# Patient Record
Sex: Male | Born: 1945 | Race: White | Hispanic: No | Marital: Married | Smoking: Former smoker
Health system: Southern US, Community
[De-identification: ages and names within clinical notes are randomized; demographics above are authoritative.]

## PROBLEM LIST (undated history)

## (undated) DIAGNOSIS — I639 Cerebral infarction, unspecified: Secondary | ICD-10-CM

## (undated) DIAGNOSIS — I2510 Atherosclerotic heart disease of native coronary artery without angina pectoris: Secondary | ICD-10-CM

## (undated) DIAGNOSIS — Z720 Tobacco use: Secondary | ICD-10-CM

## (undated) DIAGNOSIS — E785 Hyperlipidemia, unspecified: Secondary | ICD-10-CM

## (undated) DIAGNOSIS — I10 Essential (primary) hypertension: Secondary | ICD-10-CM

## (undated) DIAGNOSIS — R0609 Other forms of dyspnea: Secondary | ICD-10-CM

## (undated) DIAGNOSIS — I619 Nontraumatic intracerebral hemorrhage, unspecified: Secondary | ICD-10-CM

## (undated) DIAGNOSIS — R0600 Dyspnea, unspecified: Secondary | ICD-10-CM

## (undated) DIAGNOSIS — R569 Unspecified convulsions: Secondary | ICD-10-CM

## (undated) HISTORY — PX: CORONARY ARTERY BYPASS GRAFT: SHX141

## (undated) HISTORY — DX: Hyperlipidemia, unspecified: E78.5

## (undated) HISTORY — DX: Atherosclerotic heart disease of native coronary artery without angina pectoris: I25.10

## (undated) HISTORY — DX: Essential (primary) hypertension: I10

## (undated) HISTORY — DX: Nontraumatic intracerebral hemorrhage, unspecified: I61.9

## (undated) HISTORY — DX: Other forms of dyspnea: R06.09

## (undated) HISTORY — DX: Dyspnea, unspecified: R06.00

## (undated) HISTORY — DX: Tobacco use: Z72.0

---

## 2005-07-14 ENCOUNTER — Ambulatory Visit: Payer: Self-pay | Admitting: *Deleted

## 2005-07-22 ENCOUNTER — Ambulatory Visit: Payer: Self-pay | Admitting: *Deleted

## 2006-09-21 ENCOUNTER — Ambulatory Visit: Payer: Self-pay | Admitting: *Deleted

## 2006-09-21 LAB — CONVERTED CEMR LAB
Albumin: 3.6 g/dL (ref 3.5–5.2)
CO2: 25 meq/L (ref 19–32)
Chol/HDL Ratio, serum: 4.5
Creatinine, Ser: 1 mg/dL (ref 0.4–1.5)
Glucose, Bld: 113 mg/dL — ABNORMAL HIGH (ref 70–99)
Sodium: 139 meq/L (ref 135–145)
Total Bilirubin: 0.7 mg/dL (ref 0.3–1.2)
Triglyceride fasting, serum: 65 mg/dL (ref 0–149)
VLDL: 13 mg/dL (ref 0–40)

## 2006-09-29 ENCOUNTER — Ambulatory Visit: Payer: Self-pay | Admitting: *Deleted

## 2007-09-09 ENCOUNTER — Ambulatory Visit: Payer: Self-pay | Admitting: Cardiovascular Disease

## 2007-12-12 ENCOUNTER — Ambulatory Visit: Payer: Self-pay

## 2007-12-12 LAB — CONVERTED CEMR LAB
AST: 19 units/L (ref 0–37)
Bilirubin, Direct: 0.2 mg/dL (ref 0.0–0.3)
HDL: 27.2 mg/dL — ABNORMAL LOW (ref >39.0)
LDL Cholesterol: 64 mg/dL (ref 0–99)
Triglycerides: 77 mg/dL (ref 0–149)
VLDL: 15 mg/dL (ref 0–40)

## 2007-12-28 ENCOUNTER — Ambulatory Visit: Payer: Self-pay

## 2008-12-24 ENCOUNTER — Ambulatory Visit: Payer: Self-pay | Admitting: Cardiovascular Disease

## 2008-12-24 LAB — CONVERTED CEMR LAB
AST: 18 units/L (ref 0–37)
Alkaline Phosphatase: 57 units/L (ref 39–117)
PSA: 0.99 ng/mL (ref 0.10–4.00)
Total Bilirubin: 0.7 mg/dL (ref 0.3–1.2)

## 2008-12-27 ENCOUNTER — Ambulatory Visit: Admission: RE | Admit: 2008-12-27 | Discharge: 2008-12-27 | Payer: Self-pay | Admitting: Cardiovascular Disease

## 2009-11-28 ENCOUNTER — Encounter (INDEPENDENT_AMBULATORY_CARE_PROVIDER_SITE_OTHER): Payer: Self-pay | Admitting: *Deleted

## 2010-01-16 ENCOUNTER — Encounter: Payer: Self-pay | Admitting: Cardiovascular Disease

## 2010-02-14 ENCOUNTER — Encounter: Payer: Self-pay | Admitting: Cardiovascular Disease

## 2010-03-07 ENCOUNTER — Encounter: Payer: Self-pay | Admitting: Cardiovascular Disease

## 2010-12-25 NOTE — Letter (Signed)
Summary: Appointment - Reminder 2  [employer], Main Office  [address]   [place], [place] [address]   Phone: [phone]  Fax: [phone]     November 28, 2009 MRN: [id number]   [facility] [address] [place], [place]  [address]   Dear Mr. HINTON,  Our records indicate that it is time to schedule a follow-up appointment with Dr. Eden Emms. It is very important that we reach you to schedule this appointment. We look forward to participating in your health care needs. Please contact us at the number listed above at your earliest convenience to schedule your appointment.  If you are unable to make an appointment at this time, give [place] a call so we can update our records.  Sincerely,   Migdalia Dk [facility] Scheduling Team

## 2010-12-25 NOTE — Miscellaneous (Signed)
  Clinical Lists Changes  Medications: Added new medication of RAMIPRIL 5 MG CAPS (RAMIPRIL) Take one capsule by mouth daily

## 2010-12-25 NOTE — Miscellaneous (Signed)
  Clinical Lists Changes  Medications: Added new medication of LIPITOR 40 MG TABS (ATORVASTATIN CALCIUM) Take one tablet by mouth daily.

## 2010-12-25 NOTE — Miscellaneous (Signed)
  Clinical Lists Changes  Medications: Added new medication of NIASPAN 500 MG CR-TABS (NIACIN (ANTIHYPERLIPIDEMIC)) 1 tab  by mouth once daily

## 2011-04-07 NOTE — Assessment & Plan Note (Signed)
[facility]                            CARDIOLOGY OFFICE NOTE   NAME:Jaime Pierce, Jaime Pierce                    MRN:          [id number]  DATE:12/24/2008                            DOB:          12-15-1945    This is a 65 year old patient with previous history of coronary bypass  surgery, coronary risk factors including smoking, hyperlipidemia, and  hypertension.   His last Myoview was done on December 12, 2007 and was nonischemic.  His  bypass was done in 2004.  He is not having significant chest pain, PND,  or orthopnea.  He has exertional dyspnea.  I suspect this is from his  smoking.  He continues to smoke about 12 cigarettes a day and I  counseled him for less than 10 minutes regarding long-term risk of this.  In fact, last time I saw him I suggested that he get baseline PFTs.  Unfortunately, Kooper does not see a primary care doctor.  I am about  the only doctor he sees.  He did get his flu shots and Pneumovax.  He  lives up by the [facility] area and I recommended that he see Dr.  Hetty Ely or Everrett Coombe, FNP.   He has been compliant with his medications.  His last LDL a year ago was  64.  His LFTs were normal.  At that time, they have not been rechecked.   REVIEW OF SYSTEMS:  Otherwise remarkable for no significant palpitations  or syncope.  He has mild lower extremity edema in the right leg greater  than left which does not bother him.  He does not have a cough,  hemoptysis, pleuritic pain, or sputum production.   He is allergic to SULFA.   MEDICATIONS:  1. Altace 5 mg a day.  2. Lipitor 40 a day.  3. Aspirin a day.  4. Niaspan 500 a day.  5. Lopressor 25 b.i.d.   PHYSICAL EXAMINATION:  GENERAL:  Remarkable for healthy-appearing male  in no distress.  Affect is appropriate.  VITAL SIGNS:  Blood pressure is 130/70, pulse 59 and regular,  respiratory rate 14, and afebrile.  Weight 200 pounds.  HEENT:  Unremarkable.  NECK:  Carotids are  normal without bruit.  No lymphadenopathy,  thyromegaly, or JVP elevation.  LUNGS:  Clear.  Good diaphragmatic motion.  No wheezing.  CARDIAC:  S1 and S2.  Normal heart sounds.  PMI normal.  Abdomen:  Benign.  Bowel sounds positive.  No AAA.  No tenderness.  No  bruit.  No hepatosplenomegaly.  No hepatojugular reflux.  EXTREMITIES:  Distal pulses are intact.  No edema.  NEURO:  Nonfocal.  SKIN:  Warm and dry.  No muscular weakness.  BACK:  He does have of +1 edema in the right lower extremity and has  some seborrheic keratoses on his back.   IMPRESSION:  1. Coronary artery disease, previous coronary artery bypass graft.      Nonischemic Myoview last year.  Continue low-dose aspirin and beta-      blocker.  2. Hypertension.  Currently, well controlled.  Continue current dose  of Altace.  3. Lower extremity edema.  Elevate legs at the end of the day.      Support Hose.  Low sodium diet.  The patient did not want to had a      diuretic to his regimen at this time, since he is not bothered by.  4. Hyperlipidemia.  Continue Lipitor or Niaspan, since his LDL was      fine last year.  We will just check his LFTs today.  He is not      fasting and I am more worried about chronic toxicity from his      statin drug.  5. Health maintenance.  I encouraged him to see Dr. Hetty Ely or Everrett Coombe, FNP.  He has not had prostate checked in over 2 years.  We      will check a prostate-specific antigen today.  6. Dermatological followup.  He has some fairly large keratoses on his      back.  I encouraged him to see Dr. Margo Aye, who he had seen in the      past.  7. Chronic obstructive pulmonary disease and smoking.  This may be his      biggest long-term risk.  We will get baseline PFTs and pre and post      bronchodilator.  I encouraged him to quit smoking.  I told him we      would call in Wellbutrin or Chantix for him.  Hopefully, he will      hook up with one of our primary care doctors to  continue this      conversation.  Long-term risks of lung cancer and vascular disease,      particularly given his old bypass grafts were discussed.     Noralyn Pick. Eden Emms, MD, [facility]  Electronically Signed    PCN/MedQ  DD: 12/24/2008  DT: 12/25/2008  Job #: [id number]

## 2011-04-07 NOTE — Assessment & Plan Note (Signed)
[facility]                            CARDIOLOGY OFFICE NOTE   NAME:Dysart, GILLIS BOARDLEY                    MRN:          [id number]  DATE:09/09/2007                            DOB:          December 19, 1945    Rodriquez is seen today as a new patient by me, he is a former patient of  Dr. Glennon Hamilton.I take care of his wife Maron Stanzione.   Chandon has previous history of CABG 12 years ago, unfortunately  continues to smoke.  I read through his chart and looked at Dr. Marcha Solders  last note and this patient was supposed to have a stress Myoview and an  abdominal ultrasound after his last visit.  The patient said that he got  too busy to have this done.  He is an avid fisherman, going out to  [address] and [employer] a lot.  He says that he is willing to come  back and have these tests done at the end of January which apparently is  off peak season for fishing.  The patient is retired from [employer].  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   He does have some exertional dyspnea, it sounds secondary to his COPD  from his smoking, it does not sound cardiac in nature.  There is no PND,  orthopnea, no weight gain.  No history of congestive heart failure,  decreased LV function.  He has not been having chest pain, however,  prior to his CABG his primary symptoms were dyspnea and weakness.   We had a discussion regarding his smoking.  The patient does not seem  really motivated to quit, he has been smoking for over 25 years.  He  smokes a pack a day.  I offered to give him Chantix or Wellbutrin, he  declined.   He has been compliant with his other medications.  He needs a fasting  lipid and liver profile.   REVIEW OF SYSTEMS:  Otherwise negative.   MEDICATIONS:  1. Altace 5 a day.  2. Lipitor 40 a day.  3. An aspirin a day.  4. Niaspan 500 a day.  5. He was supposed to be on Toprol 50 a day but this was stopped as he      prefers Lopressor 25  b.i.d. and I think this is fine.   EXAMINATION:  Remarkable for middle-aged white male in no distress.  He  has nicotine on his breath.  Affect is appropriate and jovial.  His  blood pressure is 140/80, weight 196, pulse 64 and regular, respiratory  rate 14, afebrile.  HEENT:  Normal, carotids normal without bruit, there is no  lymphadenopathy, no thyromegaly, no JVP elevation.  LUNGS:  Clear, good diaphragmatic motion, no wheezing.  ABDOMEN:  Benign, bowel sounds positive, no AAA, no hepatosplenomegaly  and no hepatojugular reflux, no tenderness.  Femorals are +4 bilaterally, PTs were +3, there is no lower extremity  edema.  NEURO:  Nonfocal.  SKIN:  Warm and dry.  There is no muscular weakness.   His baseline EKG is normal with an isolated  Q wave in lead 3.   IMPRESSION:  1. Status post coronary artery bypass grafting, good left ventricular      function.  Coronary artery bypass grafting 12 years ago, continued      smoking.  Followup stress Myoview, particularly in light of the      fact that he did not have typical angina prior to his surgery.  The      patient will try to arrange this for the end of January.  2. Dyspnea, likely related to smoking.  I will leave it up to his      primary care doctor to see if he wants to do baseline pulmonary      function tests.  He has a history of good left ventricular      function.  We will do his stress test and make sure this is not an      anginal equivalent.  3. Hypertension, currently well-controlled.  Continue Altace and      Lopressor.  4. Hyperlipidemia in the setting of old bypass grafts, continue      Lipitor 40 a day.  Lipid and liver profile to be checked when he      has his stress test.  5. Per Dr. Marcha Solders request, screening abdominal aortic ultrasound to be      done in patient with known vascular disease and smoking, exam      benign.  6. Overall, I think Nehemyah is doing well.  He was counseled on      smoking for less  than 10 minutes and he understands the importance      of this.  I will see him in 6 months so long as his stress test and      ultrasound are not abnormal in January.     Noralyn Pick. Eden Emms, MD, [facility]  Electronically Signed    PCN/MedQ  DD: 09/09/2007  DT: 09/10/2007  Job #: [id number]

## 2011-04-10 NOTE — Assessment & Plan Note (Signed)
[facility]                              CARDIOLOGY OFFICE NOTE   NAME:Jaime Pierce, Jaime Pierce                    MRN:          [id number]  DATE:09/29/2006                            DOB:          04-Dec-1945    A very pleasant 65 year old white married male recently retired [employer], 12 years postop CABG.  The patient continues to smoke,  unfortunately.  He has no cardiac symptoms.   At the time of surgery the following bypasses were performed:  The LIMA to  the LAD, SVG to intermediate, SVG to OM1 and circumflex sequentially, and  SVG to PD and PL sequentially.  Additional problems include hyperlipidemia  and hypertension, which have been controlled.   MEDICATIONS:  1. Lopressor 25 b.i.d.  2. Altace 5.  3. Lipitor 40.  4. Aspirin 162.  5. Niaspan 500.   Blood pressure 133/70, pulse 59, normal sinus rhythm.  GENERAL APPEARANCE:  Normal.  JVP is not elevated.  Carotid pulses are palpable and equal without bruits.  LUNGS:  Clear.  CARDIAC:  Normal.  ABDOMEN:  Unremarkable.  EXTREMITIES:  No edema.  Diminished pulses distally.   An EKG is normal.   IMPRESSION:  1. The patient is doing well 12 years post coronary artery bypass grafting      without cardiac symptoms.  2. Cigarette abuse.  3. Hyperlipidemia, controlled.  Should note the recent LDL was 73.  4. Hypertension, controlled.   I suggested the patient change form Lopressor 25 b.i.d. to Toprol XL 50  daily.  We plan to followup stress Myoview as well as screening abdominal  ultrasound.  I will see him back in 6 months or p.r.n.    ______________________________  E. Graceann Congress, MD, [facility]    EJL/MedQ  DD: 09/29/2006  DT: 09/29/2006  Job #: [id number]

## 2011-06-05 ENCOUNTER — Other Ambulatory Visit: Payer: Self-pay | Admitting: Cardiovascular Disease

## 2011-07-15 ENCOUNTER — Other Ambulatory Visit: Payer: Self-pay | Admitting: *Deleted

## 2011-07-15 ENCOUNTER — Telehealth: Payer: Self-pay | Admitting: Cardiovascular Disease

## 2011-07-15 MED ORDER — RAMIPRIL 5 MG PO CAPS: 5.0000 mg | ORAL_CAPSULE | Freq: Every day | ORAL | Status: DC

## 2011-07-15 MED ORDER — METOPROLOL TARTRATE 50 MG PO TABS: 25.0000 mg | ORAL_TABLET | Freq: Two times a day (BID) | ORAL | Status: DC

## 2011-07-15 NOTE — Telephone Encounter (Signed)
Spoke with pharm, 90 days supply on ramipril given Deliah Goody

## 2011-07-15 NOTE — Telephone Encounter (Signed)
Pt will only pay for 90 day supply and Rx was written for 30day supply please call asap

## 2011-07-15 NOTE — Telephone Encounter (Signed)
lmom for pt rx's being sent in today and has appt 8/29 with Dr. Eden Emms 9:30. Danielle Rankin

## 2011-07-21 ENCOUNTER — Encounter: Payer: Self-pay | Admitting: Cardiovascular Disease

## 2011-07-22 ENCOUNTER — Ambulatory Visit (INDEPENDENT_AMBULATORY_CARE_PROVIDER_SITE_OTHER): Payer: BC Managed Care – PPO | Admitting: Cardiovascular Disease

## 2011-07-22 ENCOUNTER — Encounter: Payer: Self-pay | Admitting: Cardiovascular Disease

## 2011-07-22 DIAGNOSIS — F17200 Nicotine dependence, unspecified, uncomplicated: Secondary | ICD-10-CM | POA: Insufficient documentation

## 2011-07-22 DIAGNOSIS — E785 Hyperlipidemia, unspecified: Secondary | ICD-10-CM | POA: Insufficient documentation

## 2011-07-22 DIAGNOSIS — E782 Mixed hyperlipidemia: Secondary | ICD-10-CM

## 2011-07-22 DIAGNOSIS — I10 Essential (primary) hypertension: Secondary | ICD-10-CM

## 2011-07-22 DIAGNOSIS — I2510 Atherosclerotic heart disease of native coronary artery without angina pectoris: Secondary | ICD-10-CM

## 2011-07-22 MED ORDER — NITROGLYCERIN 0.4 MG SL SUBL: 0.4000 mg | SUBLINGUAL_TABLET | SUBLINGUAL | Status: DC | PRN

## 2011-07-22 MED ORDER — BUPROPION HCL ER (XL) 150 MG PO TB24: 150.0000 mg | ORAL_TABLET | Freq: Every day | ORAL | Status: DC

## 2011-07-22 NOTE — Assessment & Plan Note (Signed)
Well controlled.  Continue current medications and low sodium Dash type diet.    

## 2011-07-22 NOTE — Progress Notes (Signed)
Elder has previous history of CABG 12 years ago, unfortunately continues to smoke.He is an avid fisherman, going out to  [address] and [employer] a lot.The patient is retired from [employer]. 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  He does have some exertional dyspnea, it sounds secondary to his COPD from his smoking, it does not sound cardiac in nature. There is no PND,  orthopnea, no weight gain. No history of congestive heart failure, decreased LV function. He has not been having chest pain, however,  prior to his CABG his primary symptoms were dyspnea and weakness. We had a discussion regarding his smoking. The patient does not seem  really motivated to quit, he has been smoking for over 25 years. He smokes a pack a day.  He last quit for 6 months after his bypass.  Willing to try WelbutrinHe has been compliant with his other medications. He needs a fasting lipid and liver profile.  Does not have a primary so needs a CXR given smoking  ROS: Denies fever, malais, weight loss, blurry vision, decreased visual acuity, cough, sputum, SOB, hemoptysis, pleuritic pain, palpitaitons, heartburn, abdominal pain, melena, lower extremity edema, claudication, or rash.  All other systems reviewed and negative  General: Affect appropriate Healthy:  appears stated age HEENT: normal Neck supple with no adenopathy JVP normal no bruits no thyromegaly Lungs clear with no wheezing and good diaphragmatic motion Heart:  S1/S2 no murmur,rub, gallop or click PMI normal Abdomen: benighn, BS positve, no tenderness, no AAA no bruit.  No HSM or HJR Distal pulses intact with no bruits No edema Neuro non-focal Skin warm and dry No muscular weakness   Current Outpatient Prescriptions  Medication Sig Dispense Refill  . aspirin 81 MG tablet Take 81 mg by mouth daily.        [name] Kitchen atorvastatin (LIPITOR) 10 MG tablet Take 10 mg by mouth daily.        . metoprolol (LOPRESSOR) 50 MG tablet Take 0.5  tablets (25 mg total) by mouth 2 (two) times daily.  60 tablet  1  . niacin (NIASPAN) 500 MG CR tablet Take 500 mg by mouth at bedtime.        . ramipril (ALTACE) 5 MG capsule Take 1 capsule (5 mg total) by mouth daily.  30 capsule  0    Allergies  Sulfonamide derivatives  Electrocardiogram:  NSR 57  RAD nonspecific ST/T wave changes  Assessment and Plan

## 2011-07-22 NOTE — Assessment & Plan Note (Addendum)
Counseled for less than 10 minutes  Welbutrin called in  Needs CXR has not had one in a long time

## 2011-07-22 NOTE — Assessment & Plan Note (Signed)
Will come back for labs this week

## 2011-07-22 NOTE — Patient Instructions (Addendum)
Your physician recommends that you return for lab work in: fasting lipid, liver, cmp, psa To be done at the [place] office on Friday 8/31  Your physician wants you to follow-up in: one year.   You will receive a reminder letter in the mail two months in advance. If you don't receive a letter, please call our office to schedule the follow-up appointment.  Your physician has recommended you make the following change in your medication: Start Wellbutrin 150mg  daily  A chest x-ray takes a picture of the organs and structures inside the chest, including the heart, lungs, and blood vessels. This test can show several things, including, whether the heart is enlarges; whether fluid is building up in the lungs; and whether pacemaker / defibrillator leads are still in place. No appt necessary at the [facility] office

## 2011-07-22 NOTE — Assessment & Plan Note (Signed)
Stable with no angina and good activity level.  Continue medical Rx  

## 2011-07-24 ENCOUNTER — Other Ambulatory Visit: Payer: BC Managed Care – PPO

## 2011-07-24 ENCOUNTER — Other Ambulatory Visit (INDEPENDENT_AMBULATORY_CARE_PROVIDER_SITE_OTHER): Payer: BC Managed Care – PPO

## 2011-07-24 ENCOUNTER — Ambulatory Visit (INDEPENDENT_AMBULATORY_CARE_PROVIDER_SITE_OTHER)
Admission: RE | Admit: 2011-07-24 | Discharge: 2011-07-24 | Disposition: A | Discharge status: Home / Self Care | Payer: BC Managed Care – PPO | Source: Ambulatory Visit | Attending: Cardiovascular Disease | Admitting: Cardiovascular Disease

## 2011-07-24 DIAGNOSIS — F17200 Nicotine dependence, unspecified, uncomplicated: Secondary | ICD-10-CM

## 2011-07-24 DIAGNOSIS — I2510 Atherosclerotic heart disease of native coronary artery without angina pectoris: Secondary | ICD-10-CM

## 2011-07-24 DIAGNOSIS — E782 Mixed hyperlipidemia: Secondary | ICD-10-CM

## 2011-07-24 LAB — COMPREHENSIVE METABOLIC PANEL
Albumin: 3.9 g/dL (ref 3.5–5.2)
BUN: 15 mg/dL (ref 6–23)
CO2: 27 mEq/L (ref 19–32)
Calcium: 9.1 mg/dL (ref 8.4–10.5)
Chloride: 109 mEq/L (ref 96–112)
Creatinine, Ser: 0.9 mg/dL (ref 0.4–1.5)
GFR: 87.8 mL/min (ref >60.00)
Glucose, Bld: 113 mg/dL — ABNORMAL HIGH (ref 70–99)

## 2011-07-24 LAB — HEPATIC FUNCTION PANEL
Bilirubin, Direct: 0.2 mg/dL (ref 0.0–0.3)
Total Bilirubin: 0.7 mg/dL (ref 0.3–1.2)

## 2011-07-24 LAB — LIPID PANEL
HDL: 30.7 mg/dL — ABNORMAL LOW (ref >39.00)
LDL Cholesterol: 62 mg/dL (ref 0–99)
Total CHOL/HDL Ratio: 3
Triglycerides: 46 mg/dL (ref 0.0–149.0)

## 2011-08-12 ENCOUNTER — Telehealth: Payer: Self-pay | Admitting: Cardiovascular Disease

## 2011-08-12 NOTE — Telephone Encounter (Signed)
Ramipril with refills 30 day supply

## 2011-08-14 ENCOUNTER — Telehealth: Payer: Self-pay | Admitting: Cardiovascular Disease

## 2011-08-14 ENCOUNTER — Other Ambulatory Visit: Payer: Self-pay | Admitting: *Deleted

## 2011-08-14 MED ORDER — METOPROLOL TARTRATE 50 MG PO TABS: 25.0000 mg | ORAL_TABLET | Freq: Two times a day (BID) | ORAL | Status: DC

## 2011-08-14 MED ORDER — RAMIPRIL 5 MG PO CAPS: 5.0000 mg | ORAL_CAPSULE | Freq: Every day | ORAL | Status: DC

## 2011-08-14 NOTE — Telephone Encounter (Signed)
Pt wants refill ramapril and [facility]oprolol [facility] [place]

## 2011-08-14 NOTE — Telephone Encounter (Signed)
Pt wants refill ramapril and metoprolol tp [facility] 2nd request

## 2011-11-25 ENCOUNTER — Other Ambulatory Visit: Payer: Self-pay | Admitting: Cardiovascular Disease

## 2011-11-25 NOTE — Telephone Encounter (Signed)
..   Requested Prescriptions   Pending Prescriptions Disp Refills  . ramipril (ALTACE) 5 MG capsule [Pharmacy Med Name: RAMIPRIL 5 MG CAPSULE] 90 capsule 3    Sig: TAKE ONE CAPSULE BY MOUTH EVERY DAY  . metoprolol (LOPRESSOR) 50 MG tablet [Pharmacy Med Name: METOPROLOL TARTA 50MG  TAB] 90 tablet 3    Sig: TAKE 1/2 TABLET BY MOUTH TWICE DAILY

## 2012-05-23 ENCOUNTER — Telehealth: Payer: Self-pay | Admitting: Cardiovascular Disease

## 2012-05-23 MED ORDER — ATORVASTATIN CALCIUM 10 MG PO TABS: 10.0000 mg | ORAL_TABLET | Freq: Every day | ORAL | Status: DC

## 2012-05-23 NOTE — Telephone Encounter (Signed)
New Problem:    Called needing a refill of atorvastatin (LIPITOR) 10 MG tablet.  Please call back.

## 2012-05-27 ENCOUNTER — Other Ambulatory Visit: Payer: Self-pay | Admitting: Cardiovascular Disease

## 2012-05-27 MED ORDER — ATORVASTATIN CALCIUM 40 MG PO TABS: 40.0000 mg | ORAL_TABLET | Freq: Every day | ORAL | Status: DC

## 2012-05-27 NOTE — Telephone Encounter (Signed)
Called pt he states he has been on lipitor 40 mg for years not 10 mg, called pharmacy and they confirmed 40 mg, MAR changed.

## 2012-05-27 NOTE — Telephone Encounter (Signed)
Need clarification on dosage patient stating he take 40 mg. Office notes say 10 mg.

## 2012-05-27 NOTE — Telephone Encounter (Signed)
Needs ov before filling again

## 2012-08-19 ENCOUNTER — Other Ambulatory Visit: Payer: Self-pay | Admitting: Cardiovascular Disease

## 2012-10-07 ENCOUNTER — Other Ambulatory Visit: Payer: Self-pay | Admitting: *Deleted

## 2012-10-07 MED ORDER — ATORVASTATIN CALCIUM 40 MG PO TABS: 40.0000 mg | ORAL_TABLET | Freq: Every day | ORAL | Status: DC

## 2012-10-16 ENCOUNTER — Encounter (HOSPITAL_COMMUNITY): Payer: Self-pay | Admitting: Emergency Medicine

## 2012-10-16 ENCOUNTER — Emergency Department (INDEPENDENT_AMBULATORY_CARE_PROVIDER_SITE_OTHER)
Admission: EM | Admit: 2012-10-16 | Discharge: 2012-10-16 | Disposition: A | Discharge status: Home / Self Care | Payer: BC Managed Care – PPO | Source: Home / Self Care | Attending: Emergency Medicine | Admitting: Emergency Medicine

## 2012-10-16 ENCOUNTER — Emergency Department (INDEPENDENT_AMBULATORY_CARE_PROVIDER_SITE_OTHER): Payer: BC Managed Care – PPO

## 2012-10-16 DIAGNOSIS — S2239XA Fracture of one rib, unspecified side, initial encounter for closed fracture: Secondary | ICD-10-CM

## 2012-10-16 DIAGNOSIS — J90 Pleural effusion, not elsewhere classified: Secondary | ICD-10-CM

## 2012-10-16 MED ORDER — HYDROCODONE-ACETAMINOPHEN 5-325 MG PO TABS: ORAL_TABLET | ORAL | Status: DC

## 2012-10-16 NOTE — ED Provider Notes (Signed)
Chief Complaint  Patient presents with  . Fall    fall on wednesday pt fell off small trailer landing on left arm causing pain in ribs  "heard a pop"    History of Present Illness:   The patient is a 66 year old male who fell off a trailer this past Wednesday, 5 days ago. The trailer was about 2-3 feet in height. He landed on his left side with his arm under his chest. He had a pop in his chest and ever since then he's had pain in the left lateral chest area which is worse with movement, deep inspiration, coughing, sneezing, or laughing. He feels mildly short of breath and today coughed up some clear sputum without was treated with a few small streaks of blood. He denies any fever or chills. He's had felt dizzy or lightheaded. He denies any abdominal pain. He did not hit his head and there was no loss of consciousness. He denies any headache or neck pain. He denies any extremity pain or paresthesias. He does note some popping in the chest with deep inspiration.  Review of Systems:  Other than noted above, the patient denies any of the following symptoms: Systemic:  No fevers or chills. Eye:  No diplopia or blurred vision. ENT:  No headache, facial pain, or bleeding from the nose or ears.  No loose or broken teeth. Neck:  No neck pain or stiffnes. Resp:  No shortness of breath. Cardiac:  No chest pain. No palpitations, dizziness, syncope or fainting. GI:  No abdominal pain. No nausea, vomiting, or diarrhea. GU:  No blood in urine. M-S:  No extremity pain, swelling, bruising, limited ROM, neck or back pain. Neuro:  No headache, loss of consciousness, seizure activity, dizziness, vertigo, paresthesias, numbness, or weakness.  No difficulty with speech or ambulation.   PMFSH:  Past medical history, family history, social history, meds, and allergies were reviewed.  Physical Exam:   Vital signs:  Pulse 76  Temp 97.5 F (36.4 C) (Oral)  Resp 16  SpO2 95% General:  Alert, oriented and in no  distress. Eye:  PERRL, full EOMs. ENT:  No cranial or facial tenderness to palpation. Neck:  No tenderness to palpation.  Full ROM without pain. Heart:  Regular rhythm.  No extrasystoles, gallops, or murmers. Lungs:  There is chest wall tenderness to palpation over the left anterolateral chest area, no swelling, bruising, or deformity. Breath sounds clear and equal bilaterally.  No wheezes, rales or rhonchi. Abdomen:  Non tender. Back:  Non tender to palpation.  Full ROM without pain. Extremities:  No tenderness, swelling, bruising or deformity.  Full ROM of all joints without pain.   Neuro:  Alert and oriented times 3.  Cranial nerves intact.  No muscle weakness.  Gait normal. Skin:  No bruising, abrasions, or lacerations.  Radiology:  Dg Ribs Unilateral W/chest Left  10/16/2012  *RADIOLOGY REPORT*  Clinical Data: Fall off a trailer with hemoptysis and left rib pain.  LEFT RIBS AND CHEST - 3+ VIEW  Comparison: Chest x-ray dated 07/24/2011  Findings: Frontal chest radiograph shows clear lungs and no evidence of infiltrate, consolidation, edema, pleural fluid or pneumothorax.  Heart size is stable and within normal limits. Patient is status post prior CABG.  Left-sided rib detail films show minimally displaced fractures involving the left sixth and seventh ribs.  No associated pneumothorax.  There may be a trace amount of associated left pleural fluid.  IMPRESSION: Minimally displaced fractures involving the left sixth and  seventh ribs with potential small amount of left pleural fluid.  No associated pneumothorax.   Original Report Authenticated By: Irish Lack, M.D.    Assessment:  The primary encounter diagnosis was Rib fracture. A diagnosis of Pleural effusion was also pertinent to this visit.  He does not have pneumothorax, there is a very small amount of any pleural fluid. I do not think this requires any treatment or referral to the emergency department tonight. I have asked him to come  back in 2-3 days for recheck or sooner if he should have any worrisome symptoms such as increasing pain, increasing shortness of breath, massive hemoptysis, or fever. I would like to repeat a chest x-ray when he comes back.  Plan:   1.  The following meds were prescribed:   New Prescriptions   HYDROCODONE-ACETAMINOPHEN (NORCO/VICODIN) 5-325 MG PER TABLET    1 to 2 tabs every 4 to 6 hours as needed for pain.   2.  The patient was instructed in symptomatic care and handouts were given. 3.  The patient was told to return if becoming worse in any way, for a recheck in 2-3 days, and given some red flag symptoms that would indicate earlier return.    Reuben Likes, MD 10/16/12 [phone]

## 2012-10-16 NOTE — ED Notes (Addendum)
Pt c/o left rib pain and blood in sputum. Pt states that on Wednesday he fell off a small trailer landing on left arm and heard a pop. Pain has gradually gotten worse. On thurs and Friday not able to lay flat had to sleep in recliner.

## 2012-10-19 ENCOUNTER — Emergency Department (INDEPENDENT_AMBULATORY_CARE_PROVIDER_SITE_OTHER)
Admission: EM | Admit: 2012-10-19 | Discharge: 2012-10-19 | Disposition: A | Discharge status: Home / Self Care | Payer: BC Managed Care – PPO | Source: Home / Self Care | Attending: Emergency Medicine | Admitting: Emergency Medicine

## 2012-10-19 ENCOUNTER — Ambulatory Visit (HOSPITAL_COMMUNITY)
Admit: 2012-10-19 | Discharge: 2012-10-19 | Disposition: A | Discharge status: Home / Self Care | Payer: Medicare Other | Source: Ambulatory Visit | Attending: Emergency Medicine | Admitting: Emergency Medicine

## 2012-10-19 ENCOUNTER — Emergency Department (HOSPITAL_COMMUNITY): Payer: BC Managed Care – PPO

## 2012-10-19 ENCOUNTER — Encounter (HOSPITAL_COMMUNITY): Payer: Self-pay | Admitting: *Deleted

## 2012-10-19 DIAGNOSIS — R0602 Shortness of breath: Secondary | ICD-10-CM | POA: Insufficient documentation

## 2012-10-19 DIAGNOSIS — S2239XA Fracture of one rib, unspecified side, initial encounter for closed fracture: Secondary | ICD-10-CM

## 2012-10-19 DIAGNOSIS — Z951 Presence of aortocoronary bypass graft: Secondary | ICD-10-CM | POA: Insufficient documentation

## 2012-10-19 DIAGNOSIS — R079 Chest pain, unspecified: Secondary | ICD-10-CM | POA: Insufficient documentation

## 2012-10-19 NOTE — ED Notes (Signed)
Pt  Seen    A  Few  Days  Ago  For  Rib injury       He  Is  Here  Today  For a  followup  X  Ray      He  States  He  Feels  A  Little  Better

## 2012-10-19 NOTE — ED Provider Notes (Signed)
Chief Complaint  Patient presents with  . Follow-up    History of Present Illness:   Jaime Pierce is a 66 year old male who returns today for scheduled followup on any tiny collection of fluid in and his left pleural cavity following rib fractures. He was seen here 2 days ago having fallen off of a small trailer. X-rays showed fractures of the sixth and seventh ribs. There was a little bit of blunting of the costophrenic angle on the left. There is no pneumothorax. He had a small amount of hemoptysis the day he was seen. He had been having some pain in his left, anterolateral chest but no shortness of breath. He returns today feeling a lot better. His pain is improving. He's had no further hemoptysis and denies any shortness of breath or fever.  Review of Systems:  Other than noted above, the patient denies any of the following symptoms. Systemic:  No fever, chills, sweats, or fatigue. ENT:  No nasal congestion, rhinorrhea, or sore throat. Pulmonary:  No cough, wheezing, shortness of breath, sputum production, hemoptysis. Cardiac:  No palpitations, rapid heartbeat, dizziness, presyncope or syncope. GI:  No abdominal pain, heartburn, nausea, or vomiting. Ext:  No leg pain or swelling.  PMFSH:  Past medical history, family history, social history, meds, and allergies were reviewed and updated as needed.   Physical Exam:   Vital signs:  BP 133/72  Pulse 62  Temp 98.5 F (36.9 C) (Oral)  Resp 17  SpO2 95% Gen:  Alert, oriented, in no distress, skin warm and dry. Eye:  PERRL, lids and conjunctivas normal.  Sclera non-icteric. ENT:  Mucous membranes moist, pharynx clear. Neck:  Supple, no adenopathy or tenderness.  No JVD. Lungs:  Clear to auscultation, no wheezes, rales or rhonchi.  No respiratory distress. Heart:  Regular rhythm.  No gallops, murmers, clicks or rubs. Chest:  There chest wall tenderness to palpation over the left anterolateral chest area. No swelling, bruising, or  deformity. Abdomen:  Soft, nontender, no organomegaly or mass.  Bowel sounds normal.  No pulsatile abdominal mass or bruit. Ext:  No edema.  No calf tenderness and Homann's sign negative.  Pulses full and equal. Skin:  Warm and dry.  No rash.   Radiology:  Dg Chest 2 View  10/19/2012  *RADIOLOGY REPORT*  Clinical Data: Left chest pain, shortness of breath  CHEST - 2 VIEW  Comparison: 10/16/2012  Findings: Previous coronary bypass changes noted.  Stable heart size and vascularity.  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 No focal pneumonia, collapse, consolidation, edema, effusion, or pneumothorax.  Trachea is midline.  IMPRESSION: Prior coronary bypass.  No acute chest finding.   Original Report Authenticated By: Judie Petit. Miles Costain, M.D.    Dg Ribs Unilateral W/chest Left  10/16/2012  *RADIOLOGY REPORT*  Clinical Data: Fall off a trailer with hemoptysis and left rib pain.  LEFT RIBS AND CHEST - 3+ VIEW  Comparison: Chest x-ray dated 07/24/2011  Findings: Frontal chest radiograph shows clear lungs and no evidence of infiltrate, consolidation, edema, pleural fluid or pneumothorax.  Heart size is stable and within normal limits. Patient is status post prior CABG.  Left-sided rib detail films show minimally displaced fractures involving the left sixth and seventh ribs.  No associated pneumothorax.  There may be a trace amount of associated left pleural fluid.  IMPRESSION: Minimally displaced fractures involving the left sixth and seventh ribs with potential small amount of left pleural fluid.  No associated pneumothorax.   Original Report Authenticated By: Irish Lack, M.D.  I reviewed the images independently and personally and concur with the radiologist's findings. There is no progression of the small amount of pleural fluid. No evidence of a pneumothorax.  Assessment:  The encounter diagnosis was Rib fracture.   Plan:   1.  The following meds were prescribed:   New Prescriptions   No medications  on file   2.  The patient was instructed in symptomatic care and handouts were given. 3.  The patient was told to return if becoming worse in any way, if no better in 3 or 4 days, and given some red flag symptoms that would indicate earlier return.    Reuben Likes, MD 10/19/12 [phone]

## 2012-10-26 ENCOUNTER — Telehealth: Payer: Self-pay | Admitting: Cardiovascular Disease

## 2012-10-26 MED ORDER — METOPROLOL TARTRATE 50 MG PO TABS: ORAL_TABLET | ORAL | Status: DC

## 2012-10-26 MED ORDER — ATORVASTATIN CALCIUM 40 MG PO TABS: 40.0000 mg | ORAL_TABLET | Freq: Every day | ORAL | Status: DC

## 2012-10-26 MED ORDER — RAMIPRIL 5 MG PO CAPS: ORAL_CAPSULE | ORAL | Status: DC

## 2012-10-26 NOTE — Telephone Encounter (Signed)
New Problem:    Patient called in needing a 90 day refills for all of his medications.  Patient has a return appointment on 11/30/11.  Please call back if you have any questions.

## 2012-11-09 ENCOUNTER — Other Ambulatory Visit: Payer: Self-pay | Admitting: *Deleted

## 2012-11-09 DIAGNOSIS — I10 Essential (primary) hypertension: Secondary | ICD-10-CM

## 2012-11-09 DIAGNOSIS — Z79899 Other long term (current) drug therapy: Secondary | ICD-10-CM

## 2012-11-09 DIAGNOSIS — Z139 Encounter for screening, unspecified: Secondary | ICD-10-CM

## 2012-11-09 DIAGNOSIS — E785 Hyperlipidemia, unspecified: Secondary | ICD-10-CM

## 2012-11-25 ENCOUNTER — Other Ambulatory Visit (INDEPENDENT_AMBULATORY_CARE_PROVIDER_SITE_OTHER): Payer: BC Managed Care – PPO

## 2012-11-25 DIAGNOSIS — E785 Hyperlipidemia, unspecified: Secondary | ICD-10-CM

## 2012-11-25 DIAGNOSIS — Z79899 Other long term (current) drug therapy: Secondary | ICD-10-CM

## 2012-11-25 DIAGNOSIS — Z139 Encounter for screening, unspecified: Secondary | ICD-10-CM

## 2012-11-25 DIAGNOSIS — I10 Essential (primary) hypertension: Secondary | ICD-10-CM

## 2012-11-25 LAB — LIPID PANEL
HDL: 28.9 mg/dL — ABNORMAL LOW (ref >39.00)
LDL Cholesterol: 68 mg/dL (ref 0–99)
Total CHOL/HDL Ratio: 4
Triglycerides: 62 mg/dL (ref 0.0–149.0)

## 2012-11-25 LAB — BASIC METABOLIC PANEL
Creatinine, Ser: 0.9 mg/dL (ref 0.4–1.5)
Potassium: 4.1 mEq/L (ref 3.5–5.1)

## 2012-11-25 LAB — HEPATIC FUNCTION PANEL
Albumin: 3.7 g/dL (ref 3.5–5.2)
Total Bilirubin: 1.1 mg/dL (ref 0.3–1.2)

## 2012-11-25 LAB — PSA: PSA: 1.5 ng/mL (ref 0.10–4.00)

## 2012-11-29 ENCOUNTER — Ambulatory Visit (INDEPENDENT_AMBULATORY_CARE_PROVIDER_SITE_OTHER): Payer: BC Managed Care – PPO | Admitting: Cardiovascular Disease

## 2012-11-29 ENCOUNTER — Encounter: Payer: Self-pay | Admitting: Cardiovascular Disease

## 2012-11-29 VITALS — BP 124/80 | HR 62 | Ht 72.0 in | Wt 189.4 lb

## 2012-11-29 DIAGNOSIS — I10 Essential (primary) hypertension: Secondary | ICD-10-CM

## 2012-11-29 DIAGNOSIS — F17200 Nicotine dependence, unspecified, uncomplicated: Secondary | ICD-10-CM

## 2012-11-29 DIAGNOSIS — E782 Mixed hyperlipidemia: Secondary | ICD-10-CM

## 2012-11-29 MED ORDER — NITROGLYCERIN 0.4 MG SL SUBL: 0.4000 mg | SUBLINGUAL_TABLET | SUBLINGUAL | Status: DC | PRN

## 2012-11-29 NOTE — Assessment & Plan Note (Signed)
Stopped welbutrin didn't tolerate Still smoking with little motivation to quit

## 2012-11-29 NOTE — Addendum Note (Signed)
Addended by: Scherrie Bateman E on: 11/29/2012 11:39 AM   Modules accepted: Orders

## 2012-11-29 NOTE — Assessment & Plan Note (Signed)
Well controlled.  Continue current medications and low sodium Dash type diet.    

## 2012-11-29 NOTE — Progress Notes (Signed)
Patient ID: Jaime Pierce, male   DOB: [birth year], 67 y.o.   MRN: [id number] Jaime Pierce has previous history of CABG 12 years ago, unfortunately continues to smoke.He is an avid fisherman, going out to  [address] and [employer] a lot.The patient is retired from [employer]. 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  He does have some exertional dyspnea, it sounds secondary to his COPD from his smoking, it does not sound cardiac in nature. There is no PND,  orthopnea, no weight gain. No history of congestive heart failure, decreased LV function. He has not been having chest pain, however,  prior to his CABG his primary symptoms were dyspnea and weakness. We had a discussion regarding his smoking. The patient does not seem  really motivated to quit, he has been smoking for over 25 years. He smokes a pack a day. He last quit for 6 months after his bypass. Willing to try WelbutrinHe has been compliant with his other medications. He needs a fasting lipid and liver profile. CXR 11/13 NAD old sternotomy  Recent rib fracture from fall with tiny effusion Reviewed labs and LDL 66 1/14  ROS: Denies fever, malais, weight loss, blurry vision, decreased visual acuity, cough, sputum, SOB, hemoptysis, pleuritic pain, palpitaitons, heartburn, abdominal pain, melena, lower extremity edema, claudication, or rash.  All other systems reviewed and negative  General: Affect appropriate Elderly COPDer HEENT: normal Neck supple with no adenopathy JVP normal no bruits no thyromegaly Lungs clear with no wheezing and good diaphragmatic motion Heart:  S1/S2 no murmur, no rub, gallop or click sternotomy PMI normal Abdomen: benighn, BS positve, no tenderness, no AAA no bruit.  No HSM or HJR Distal pulses intact with no bruits No edema Neuro non-focal Skin warm and dry No muscular weakness   Current Outpatient Prescriptions  Medication Sig Dispense Refill  . aspirin 81 MG tablet Take 81 mg by mouth daily.         [name] Kitchen atorvastatin (LIPITOR) 40 MG tablet Take 1 tablet (40 mg total) by mouth daily.  90 tablet  0  . buPROPion (WELLBUTRIN XL) 150 MG 24 hr tablet Take 1 tablet (150 mg total) by mouth daily.  30 tablet  3  . HYDROcodone-acetaminophen (NORCO/VICODIN) 5-325 MG per tablet 1 to 2 tabs every 4 to 6 hours as needed for pain.  20 tablet  0  . metoprolol (LOPRESSOR) 50 MG tablet TAKE 1/2 TABLET BY MOUTH TWICE DAILY  90 tablet  0  . niacin (NIASPAN) 500 MG CR tablet Take 500 mg by mouth at bedtime.        . nitroGLYCERIN (NITROSTAT) 0.4 MG SL tablet Place 1 tablet (0.4 mg total) under the tongue every 5 (five) minutes as needed for chest pain.  25 tablet  3  . ramipril (ALTACE) 5 MG capsule TAKE ONE CAPSULE BY MOUTH EVERY DAY  90 capsule  0    Allergies  Sulfonamide derivatives  Electrocardiogram:  07/22/11  SR rate 57 nonspecific St/T changes  Today SR rate 62 LAD nonspecific ST/T wave change no change from 2012  Assessment and Plan

## 2012-11-29 NOTE — Assessment & Plan Note (Signed)
Cholesterol is at goal.  Continue current dose of statin and diet Rx.  No myalgias or side effects.  F/U  LFT's in 6 months. Lab Results  Component Value Date   LDLCALC 68 11/25/2012

## 2012-11-29 NOTE — Patient Instructions (Addendum)
Your physician wants you to follow-up in:  6 MONTHS WITH DR NISHAN  You will receive a reminder letter in the mail two months in advance. If you don't receive a letter, please call our office to schedule the follow-up appointment. Your physician recommends that you continue on your current medications as directed. Please refer to the Current Medication list given to you today. 

## 2013-02-23 ENCOUNTER — Other Ambulatory Visit: Payer: Self-pay | Admitting: *Deleted

## 2013-02-23 MED ORDER — ATORVASTATIN CALCIUM 40 MG PO TABS: 40.0000 mg | ORAL_TABLET | Freq: Every day | ORAL | Status: DC

## 2013-02-23 MED ORDER — RAMIPRIL 5 MG PO CAPS: ORAL_CAPSULE | ORAL | Status: DC

## 2013-02-23 MED ORDER — METOPROLOL TARTRATE 50 MG PO TABS: ORAL_TABLET | ORAL | Status: DC

## 2013-02-23 NOTE — Telephone Encounter (Signed)
Fax Received. Refill Completed. Kinnie Kaupp Chowoe (R.M.A)   

## 2013-02-23 NOTE — Telephone Encounter (Signed)
Fax Received. Refill Completed. Jaime Pierce (R.M.A)   

## 2013-02-27 ENCOUNTER — Telehealth: Payer: Self-pay

## 2013-02-27 NOTE — Telephone Encounter (Signed)
Pt called to and left message about his medications needing to be refilled so called and verified that pt has his medications and did not need any refills

## 2013-08-09 ENCOUNTER — Encounter: Payer: Self-pay | Admitting: Cardiovascular Disease

## 2014-02-16 ENCOUNTER — Other Ambulatory Visit: Payer: Self-pay

## 2014-02-16 MED ORDER — RAMIPRIL 5 MG PO CAPS: ORAL_CAPSULE | ORAL | Status: DC

## 2014-02-16 MED ORDER — METOPROLOL TARTRATE 50 MG PO TABS: ORAL_TABLET | ORAL | Status: DC

## 2014-02-16 MED ORDER — ATORVASTATIN CALCIUM 40 MG PO TABS: 40.0000 mg | ORAL_TABLET | Freq: Every day | ORAL | Status: DC

## 2014-06-21 ENCOUNTER — Other Ambulatory Visit: Payer: Self-pay

## 2014-06-21 MED ORDER — METOPROLOL TARTRATE 50 MG PO TABS: ORAL_TABLET | ORAL | Status: DC

## 2014-08-22 ENCOUNTER — Other Ambulatory Visit: Payer: Self-pay | Admitting: *Deleted

## 2014-08-22 MED ORDER — METOPROLOL TARTRATE 50 MG PO TABS: ORAL_TABLET | ORAL | Status: DC

## 2014-09-10 ENCOUNTER — Other Ambulatory Visit: Payer: Self-pay | Admitting: *Deleted

## 2014-09-10 MED ORDER — METOPROLOL TARTRATE 50 MG PO TABS: ORAL_TABLET | ORAL | Status: DC

## 2014-11-01 ENCOUNTER — Telehealth: Payer: Self-pay | Admitting: Cardiovascular Disease

## 2014-11-01 NOTE — Telephone Encounter (Signed)
LMTCB ./CY 

## 2014-11-01 NOTE — Telephone Encounter (Signed)
New Message   Pt called asking about BP medication (Metropolol). Please call back and discuss.

## 2014-11-06 ENCOUNTER — Telehealth: Payer: Self-pay | Admitting: Cardiovascular Disease

## 2014-11-06 MED ORDER — METOPROLOL TARTRATE 50 MG PO TABS: ORAL_TABLET | ORAL | Status: DC

## 2014-11-06 NOTE — Telephone Encounter (Signed)
New Msg    Pt returning call. Please contact at [phone]

## 2014-11-06 NOTE — Telephone Encounter (Signed)
SEE OTHER PHONE NOTE./CY 

## 2014-11-06 NOTE — Telephone Encounter (Signed)
PT NEEDED  REFILL ON  METOPROLOL REFILL  SENT  VIA  EPIC  PT  HAS  APPT   ON  11-27-14 WITH  DR Eden EmmsNISHAN

## 2014-11-16 ENCOUNTER — Other Ambulatory Visit: Payer: Self-pay | Admitting: Cardiovascular Disease

## 2014-11-26 NOTE — Progress Notes (Signed)
Patient ID: Jaime Pierce, male   DOB: 01-15-1946, 69 y.o.   MRN: [id number] Jaime Pierce has previous history of CABG 13 years ago, unfortunately continues to smoke.He is an avid fisherman, going out to  [address] and [employer] a lot.The patient is retired from [employer]. 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  He does have some exertional dyspnea, it sounds secondary to his COPD from his smoking, it does not sound cardiac in nature. There is no PND,  orthopnea, no weight gain. No history of congestive heart failure, decreased LV function. He has not been having chest pain, however,  prior to his CABG his primary symptoms were dyspnea and weakness.  We had a discussion regarding his smoking. The patient does not seem  really motivated to quit, he has been smoking for over 25 years. He smokes a pack a day. He last quit for 6 months after his bypass. Willing to try Welbutrin  He has been compliant with his other medications. He needs a fasting lipid and liver profile.  CXR 11/13 NAD old sternotomy    1/14  LDL ws 68    Having more exertional dyspnea and tightness which may be wheezing  He does not like to go to doctor and not clear he will f/u with my recommendations  ROS: Denies fever, malais, weight loss, blurry vision, decreased visual acuity, cough, sputum, SOB, hemoptysis, pleuritic pain, palpitaitons, heartburn, abdominal pain, melena, lower extremity edema, claudication, or rash.  All other systems reviewed and negative  General: Affect appropriate Elderly COPDer HEENT: normal Neck supple with no adenopathy JVP normal no bruits no thyromegaly Lungs clear with no wheezing and good diaphragmatic motion Heart:  S1/S2 no murmur, no rub, gallop or click sternotomy PMI normal Abdomen: benighn, BS positve, no tenderness, no AAA no bruit.  No HSM or HJR Distal pulses intact with no bruits No edema Neuro non-focal Skin warm and dry No muscular weakness   Current  Outpatient Prescriptions  Medication Sig Dispense Refill  . aspirin 81 MG tablet Take 81 mg by mouth daily.      [name] Kitchen atorvastatin (LIPITOR) 40 MG tablet Take 1 tablet (40 mg total) by mouth daily. 30 tablet 2  . metoprolol (LOPRESSOR) 50 MG tablet TAKE 1/2 TABLET BY MOUTH TWICE DAILY 30 tablet 11  . nitroGLYCERIN (NITROSTAT) 0.4 MG SL tablet Place 1 tablet (0.4 mg total) under the tongue every 5 (five) minutes as needed for chest pain. 100 tablet 3  . ramipril (ALTACE) 5 MG capsule TAKE ONE CAPSULE BY MOUTH EVERY DAY 30 capsule 0   No current facility-administered medications for this visit.    Allergies  Sulfonamide derivatives  Electrocardiogram:  07/22/11  SR rate 57 nonspecific St/T changes  11/29/12  SR rate 62 LAD nonspecific ST/T wave change no change from 2012  SR rate 53 RAD nonspecific ST changes   Today 11/28/14  SR rate 53  RAD nonspecfic ST changes   Assessment and Plan

## 2014-11-27 ENCOUNTER — Ambulatory Visit (INDEPENDENT_AMBULATORY_CARE_PROVIDER_SITE_OTHER)
Admission: RE | Admit: 2014-11-27 | Discharge: 2014-11-27 | Disposition: A | Discharge status: Home / Self Care | Payer: Medicare Other | Source: Ambulatory Visit | Attending: Cardiovascular Disease | Admitting: Cardiovascular Disease

## 2014-11-27 ENCOUNTER — Encounter: Payer: Self-pay | Admitting: Cardiovascular Disease

## 2014-11-27 ENCOUNTER — Ambulatory Visit (INDEPENDENT_AMBULATORY_CARE_PROVIDER_SITE_OTHER): Payer: Medicare Other | Admitting: Cardiovascular Disease

## 2014-11-27 VITALS — BP 136/84 | HR 53 | Ht 72.0 in | Wt 187.0 lb

## 2014-11-27 DIAGNOSIS — R079 Chest pain, unspecified: Secondary | ICD-10-CM

## 2014-11-27 DIAGNOSIS — J449 Chronic obstructive pulmonary disease, unspecified: Secondary | ICD-10-CM

## 2014-11-27 MED ORDER — ATORVASTATIN CALCIUM 40 MG PO TABS: 40.0000 mg | ORAL_TABLET | Freq: Every day | ORAL | Status: DC

## 2014-11-27 MED ORDER — METOPROLOL TARTRATE 50 MG PO TABS: ORAL_TABLET | ORAL | Status: DC

## 2014-11-27 MED ORDER — RAMIPRIL 5 MG PO CAPS: 5.0000 mg | ORAL_CAPSULE | Freq: Every day | ORAL | Status: DC

## 2014-11-27 NOTE — Assessment & Plan Note (Signed)
Cholesterol is at goal.  Continue current dose of statin and diet Rx.  No myalgias or side effects.  F/U  LFT's in 6 months. Lab Results  Component Value Date   LDLCALC 68 11/25/2012

## 2014-11-27 NOTE — Assessment & Plan Note (Signed)
Distant bypass exertional tightness in chest  Continue ASA and beta blocker exercise myovue

## 2014-11-27 NOTE — Assessment & Plan Note (Signed)
CXR today would benefit from inhalers  PFTls and refer to pulmonary counseled on smoking cessation Welbutrin was not tolerated by him

## 2014-11-27 NOTE — Patient Instructions (Addendum)
Your physician wants you to follow-up in: YEAR WITH  DR Haywood FillerNISHAN  You will receive a reminder letter in the mail two months in advance. If you don't receive a letter, please call our office to schedule the follow-up appointment. You have been referred to   PULMONARY Your physician recommends that you continue on your current medications as directed. Please refer to the Current Medication list given to you today. Your physician has requested that you have en exercise stress myoview. For further information please visit [email or web address]. Please follow instruction sheet, as given. A chest x-ray takes a picture of the organs and structures inside the chest, including the heart, lungs, and blood vessels. This test can show several things, including, whether the heart is enlarges; whether fluid is building up in the lungs; and whether pacemaker / defibrillator leads are still in place.  Your physician has recommended that you have a pulmonary function test. Pulmonary Function Tests are a group of tests that measure how well air moves in and out of your lungs.

## 2014-11-27 NOTE — Assessment & Plan Note (Signed)
Cholesterol is at goal.  Continue current dose of statin and diet Rx.  No myalgias or side effects.  F/U  LFT's in 6 months. Lab Results  Component Value Date   LDLCALC 68 11/25/2012             

## 2014-11-29 ENCOUNTER — Telehealth: Payer: Self-pay | Admitting: Cardiovascular Disease

## 2014-11-29 ENCOUNTER — Encounter: Payer: Self-pay | Admitting: Cardiovascular Disease

## 2014-11-29 NOTE — Telephone Encounter (Signed)
Follow Up Pt returned call to//sr

## 2014-11-29 NOTE — Telephone Encounter (Signed)
PT AWARE OF  CXR RESULTS .Zack Seal/CY

## 2014-12-05 ENCOUNTER — Ambulatory Visit (HOSPITAL_COMMUNITY): Payer: Medicare Other | Attending: Cardiovascular Disease | Admitting: Radiology

## 2014-12-05 DIAGNOSIS — E785 Hyperlipidemia, unspecified: Secondary | ICD-10-CM | POA: Diagnosis not present

## 2014-12-05 DIAGNOSIS — R079 Chest pain, unspecified: Secondary | ICD-10-CM | POA: Diagnosis not present

## 2014-12-05 DIAGNOSIS — I10 Essential (primary) hypertension: Secondary | ICD-10-CM | POA: Insufficient documentation

## 2014-12-05 DIAGNOSIS — R9431 Abnormal electrocardiogram [ECG] [EKG]: Secondary | ICD-10-CM | POA: Insufficient documentation

## 2014-12-05 DIAGNOSIS — R0609 Other forms of dyspnea: Secondary | ICD-10-CM | POA: Diagnosis not present

## 2014-12-05 MED ORDER — TECHNETIUM TC 99M SESTAMIBI GENERIC - CARDIOLITE
30.0000 | Freq: Once | INTRAVENOUS | Status: AC | PRN
  Administered 2014-12-05: 30 via INTRAVENOUS

## 2014-12-05 MED ORDER — TECHNETIUM TC 99M SESTAMIBI GENERIC - CARDIOLITE
10.0000 | Freq: Once | INTRAVENOUS | Status: AC | PRN
  Administered 2014-12-05: 10 via INTRAVENOUS

## 2014-12-05 NOTE — Progress Notes (Signed)
[facility] SITE 3 NUCLEAR MED [address] [place], [place] [address] [phone]    Cardiology Nuclear Med Study  Jaime FuelRichard A Burtis is a 69 y.o. male     MRN : [id number]     DOB: Nov 25, 1945  Procedure Date: 12/05/2014  Nuclear Med Background Indication for Stress Test:  Evaluation for Ischemia and Abnormal EKG History:  COPD and CAD-CABG '09 MPI: NL, EF: 63%  Cardiac Risk Factors: Hypertension and Lipids  Symptoms:  Chest Pain and DOE   Nuclear Pre-Procedure Caffeine/Decaff Intake:  None NPO After: 8:00am   Lungs:  clear O2 Sat: 94% on room air. IV 0.9% NS with Angio Cath:  22g  IV Site: R Hand  IV Started by:  Cathlyn Parsonsynthia Hasspacher, RN  Chest Size (in):  44 Cup Size: n/a  Height: 6' (1.829 m)  Weight:  185 lb (83.915 kg)  BMI:  Body mass index is 25.08 kg/(m^2). Tech Comments:  No Lopressor x 24 hrs    Nuclear Med Study 1 or 2 day study: 1 day  Stress Test Type:  Stress  Reading MD: n/a  Order Authorizing Provider:  Burna CashPeter Nishan,MD  Resting Radionuclide: Technetium 3235m Sestamibi  Resting Radionuclide Dose: 11.0 mCi   Stress Radionuclide:  Technetium 6135m Sestamibi  Stress Radionuclide Dose: 33.0 mCi           Stress Protocol Rest HR: 68 Stress HR: 133  Rest BP: 128/90 Stress BP: 183/79  Exercise Time (min): 9:00 METS: 10.10   Predicted Max HR: 152 bpm % Max HR: 87.5 bpm Rate Pressure Product: [id number]   Dose of Adenosine (mg):  n/a Dose of Lexiscan: n/a mg  Dose of Atropine (mg): n/a Dose of Dobutamine: n/a mcg/kg/min (at max HR)  Stress Test Technologist: Milana NaSabrina Williams, EMT-P  Nuclear Technologist:  Kerby NoraElzbieta Kubak, CNMT     Rest Procedure:  Myocardial perfusion imaging was performed at rest 45 minutes following the intravenous administration of Technetium 5235m Sestamibi. Rest ECG: Normal sinus rhythm. Normal EKG.  Stress Procedure:  The patient exercised on the treadmill utilizing the Bruce Protocol for 9:00 minutes. The patient stopped due to sob,  fatigue,  and denied any chest pain.  Technetium 4835m Sestamibi was injected at peak exercise and myocardial perfusion imaging was performed after a brief delay. Stress ECG: No significant change from baseline ECG  QPS Raw Data Images:  Normal; no motion artifact; normal heart/lung ratio. Stress Images:  Small area of mild decreased activity affecting the base/mid inferoseptal segments and the mid inferior segment. This area is fixed. Rest Images:  The rest images are the same as the stress images. Subtraction (SDS):  No evidence of ischemia. Transient Ischemic Dilatation (Normal <1.22):  0.68 Lung/Heart Ratio (Normal <0.45):  0.24  Quantitative Gated Spect Images QGS EDV:  74 ml QGS ESV:  29 ml  Impression Exercise Capacity:  Good exercise capacity. BP Response:  Normal blood pressure response. Clinical Symptoms:  Shortness of breath and fatigue with stress ECG Impression:  No significant ST segment change suggestive of ischemia. Comparison with Prior Nuclear Study: No images to compare  Overall Impression:  The study is abnormal. However this is a low risk scan. There is suggestion of a small area of scar in the inferoseptal wall and inferior wall. There is hypokinesis of the septum. This could possibly be related to CABG or to this small area of scar. There is no significant ischemia.  LV Ejection Fraction: 60%.  LV Wall Motion:  There is  hypokinesis of the septum.  Willa Rough , MD

## 2014-12-24 ENCOUNTER — Ambulatory Visit (INDEPENDENT_AMBULATORY_CARE_PROVIDER_SITE_OTHER): Payer: Medicare Other | Admitting: Internal Medicine

## 2014-12-24 ENCOUNTER — Encounter: Payer: Self-pay | Admitting: Internal Medicine

## 2014-12-24 VITALS — BP 108/68 | HR 54 | Temp 97.6°F | Ht 72.0 in | Wt 191.0 lb

## 2014-12-24 DIAGNOSIS — I10 Essential (primary) hypertension: Secondary | ICD-10-CM

## 2014-12-24 DIAGNOSIS — F17200 Nicotine dependence, unspecified, uncomplicated: Secondary | ICD-10-CM

## 2014-12-24 DIAGNOSIS — J449 Chronic obstructive pulmonary disease, unspecified: Secondary | ICD-10-CM | POA: Insufficient documentation

## 2014-12-24 DIAGNOSIS — Z720 Tobacco use: Secondary | ICD-10-CM

## 2014-12-24 LAB — PULMONARY FUNCTION TEST
DL/VA % pred: 37 %
DL/VA: 1.76 ml/min/mmHg/L
DLCO unc % pred: 36 %
DLCO unc: 12.83 ml/min/mmHg
FEF 25-75 POST: 0.98 L/s
FEF 25-75 Pre: 0.78 L/sec
FEF2575-%Change-Post: 25 %
FEF2575-%Pred-Post: 35 %
FEF2575-%Pred-Pre: 28 %
FEV1-%Change-Post: 8 %
FEV1-%Pred-Post: 60 %
FEV1-%Pred-Pre: 55 %
FEV1-POST: 2.17 L
FEV1-Pre: 2 L
FEV1FVC-%Change-Post: 0 %
FEV1FVC-%PRED-PRE: 70 %
FEV6-%Change-Post: 8 %
FEV6-%Pred-Post: 86 %
FEV6-%Pred-Pre: 79 %
FEV6-Post: 3.95 L
FEV6-Pre: 3.65 L
FEV6FVC-%Change-Post: 0 %
FEV6FVC-%Pred-Post: 99 %
FEV6FVC-%Pred-Pre: 100 %
FVC-%CHANGE-POST: 8 %
FVC-%PRED-POST: 86 %
FVC-%PRED-PRE: 79 %
FVC-Post: 4.17 L
FVC-Pre: 3.85 L
POST FEV6/FVC RATIO: 95 %
PRE FEV1/FVC RATIO: 52 %
PRE FEV6/FVC RATIO: 95 %
Post FEV1/FVC ratio: 52 %
RV % PRED: 101 %
RV: 2.56 L
TLC % pred: 107 %
TLC: 7.95 L

## 2014-12-24 MED ORDER — VALSARTAN 160 MG PO TABS: 160.0000 mg | ORAL_TABLET | Freq: Every day | ORAL | Status: DC

## 2014-12-24 NOTE — Patient Instructions (Signed)
Stop altace and replace with Diovan 160 mg daily   The key is to stop smoking completely before smoking completely stops you - it's not too late in your case   Please schedule a follow up office visit in 6 weeks, call sooner if needed

## 2014-12-24 NOTE — Progress Notes (Signed)
   Subjective:    Patient ID: Jaime Pierce, male    DOB: Dec 22, 1945,    MRN: [id number]  HPI  [age] yowm active smoker with doe x 2015 just with exertion  assoc dry cough referred by Dr Eden EmmsNishan to pulmonary clinic 12/24/14    12/24/2014 1st [facility] office visit/ Jaime Pierce / on acei / smoker  Chief Complaint  Patient presents with  . Advice Only    Referred by Dr. Eden EmmsNishan for increasing SOB X1 year.    indolent onset doe x one year somewhat variable with coughing never tried inhalers Baseline=  Sob when Get in a hurry, steps but other days when coughing more gets more  sob to point where occurs x across the room Cough is dry/ sporadic/ daytime assoc with mild hoarseness  No obvious day to day or daytime variabilty or assoc cp or chest tightness, subjective wheeze overt sinus or hb symptoms. No unusual exp hx or h/o childhood pna/ asthma or knowledge of premature birth.  Sleeping ok without nocturnal  or early am exacerbation  of respiratory  c/o's or need for noct saba. Also denies any obvious fluctuation of symptoms with weather or environmental changes or other aggravating or alleviating factors except as outlined above   Current Medications, Allergies, Complete Past Medical History, Past Surgical History, Family History, and Social History were reviewed in [facility] electronic medical record.              Review of Systems  Constitutional: Negative for fever and unexpected weight change.  HENT: Negative for congestion, dental problem, ear pain, nosebleeds, postnasal drip, rhinorrhea, sinus pressure, sneezing, sore throat and trouble swallowing.   Eyes: Negative for redness and itching.  Respiratory: Positive for shortness of breath. Negative for cough, chest tightness and wheezing.   Cardiovascular: Negative for palpitations and leg swelling.  Gastrointestinal: Negative for nausea and vomiting.  Genitourinary: Negative for dysuria.  Musculoskeletal: Negative for joint  swelling.  Skin: Negative for rash.  Neurological: Negative for headaches.  Hematological: Does not bruise/bleed easily.  Psychiatric/Behavioral: Negative for dysphoric mood. The patient is not nervous/anxious.        Objective:   Physical Exam   amb slt hoarse wm nad  Wt Readings from Last 3 Encounters:  12/24/14 191 lb (86.637 kg)  12/05/14 185 lb (83.915 kg)  11/27/14 187 lb (84.823 kg)    Vital signs reviewed   HEENT: nl dentition, turbinates, and orophanx. Nl external ear canals without cough reflex   NECK :  without JVD/Nodes/TM/ nl carotid upstrokes bilaterally   LUNGS: no acc muscle use,slt decreased bs  without cough on insp or exp maneuvers   CV:  RRR  no s3 or murmur or increase in P2, no edema   ABD:  soft and nontender with nl excursion in the supine position. No bruits or organomegaly, bowel sounds nl  MS:  warm without deformities, calf tenderness, cyanosis or clubbing  SKIN: warm and dry without lesions    NEURO:  alert, approp, no deficits   CXR:  11/27/14 I personally reviewed images and agree with radiology impression as follows:   COPD. There is no active cardiopulmonary disease.          Assessment & Plan:

## 2014-12-24 NOTE — Progress Notes (Signed)
PFT done today. 

## 2014-12-25 NOTE — Assessment & Plan Note (Addendum)
-   PFTs 12/24/14 FEV1  2.17 (60%) ratio 52 s reversibility  and DLCO 36 and 37% corrects for alv vol  - 12/24/2014  Walked RA x 3 laps @ 185 ft each stopped due to  End of study, moderate pace, no  sob even at end with sat still 89%     With an FEV1  > 2liters he should only be short of breath with heavy exertion and never just walking across the room so Symptoms are   disproportionate to objective findings and not clear this is all  lung problem but pt does appear to have difficult airway management issues.   DDX of  difficult airways management all start with A and  include Adherence, Ace Inhibitors, Acid Reflux, Active Sinus Disease, Alpha 1 Antitripsin deficiency, Anxiety masquerading as Airways dz,  ABPA,  allergy(esp in young), Aspiration (esp in elderly), Adverse effects of DPI,  Active smokers, plus two Bs  = Bronchiectasis and Beta blocker use..and one C= CHF  Adherence is always the initial "prime suspect" and is a multilayered concern that requires a "trust but verify" approach in every patient - starting with knowing how to use medications, especially inhalers, correctly, keeping up with refills and understanding the fundamental difference between maintenance and prns vs those medications only taken for a very short course and then stopped and not refilled.   ? ACEi effect > the only way to exclude it is trial off  Active smoker > see sep a/p  ? Allergy/ asthma > certainly possible but when he has flares of sob he improves s inhalers so this is less likely plus he did not reverse for us today  ? BB effect > doubt it at such low doses of lopressor  ? chf component> defer this question to Dr Eden EmmsNishan/ referring cardiologist of record but I did not specifically address it.

## 2014-12-25 NOTE — Assessment & Plan Note (Signed)

## 2014-12-25 NOTE — Assessment & Plan Note (Signed)
ACE inhibitors are problematic in  pts with airway complaints because  even experienced pulmonologists can't always distinguish ace effects from copd/asthma/pnds/ allergies etc.  By themselves they don't actually cause a problem, much like oxygen can't by itself start a fire, but they certainly serve as a powerful catalyst or enhancer for any "fire"  or inflammatory process in the upper airway, be it caused by an ET  tube or more commonly reflux (especially in the obese or pts with known GERD or who are on biphoshonates) or URI's, due to interference with bradykinin clearance.  The effects of acei on bradykinin levels occurs in 100% of pt's on acei (unless they surreptitiously stop the med!) but the classic cough is only reported in 5%.  This leaves 95% of pts on acei's  with a variety of syndromes including no identifiable symptom in most  vs non-specific symptoms that wax and wane depending on what other insult is occuring at the level of the upper airway.   The only way to exclude acei as a suspect in pts with non-specific symptoms is a trial off for a minimum of a month, ideally 6 weeks  Will try to substitute diovan 160 mg daily, to double the dose if BP not controlled

## 2015-02-04 ENCOUNTER — Ambulatory Visit (INDEPENDENT_AMBULATORY_CARE_PROVIDER_SITE_OTHER): Payer: Medicare Other | Admitting: Internal Medicine

## 2015-02-04 ENCOUNTER — Encounter: Payer: Self-pay | Admitting: Internal Medicine

## 2015-02-04 VITALS — BP 122/78 | HR 61 | Ht 72.0 in | Wt 189.0 lb

## 2015-02-04 DIAGNOSIS — F17200 Nicotine dependence, unspecified, uncomplicated: Secondary | ICD-10-CM

## 2015-02-04 DIAGNOSIS — I10 Essential (primary) hypertension: Secondary | ICD-10-CM

## 2015-02-04 DIAGNOSIS — J449 Chronic obstructive pulmonary disease, unspecified: Secondary | ICD-10-CM

## 2015-02-04 DIAGNOSIS — Z720 Tobacco use: Secondary | ICD-10-CM

## 2015-02-04 MED ORDER — MOMETASONE FURO-FORMOTEROL FUM 100-5 MCG/ACT IN AERO: INHALATION_SPRAY | RESPIRATORY_TRACT | Status: DC

## 2015-02-04 NOTE — Progress Notes (Signed)
Subjective:    Patient ID: Jaime Pierce, male    DOB: 09-03-1946,    MRN: [id number]    Brief patient profile:  41 yowm active smoker with doe x 2015 just with exertion  assoc dry cough referred by Dr Eden Emms to pulmonary clinic 12/24/14    History of Present Illness  12/24/2014 1st  Pulmonary office visit/ [facility] / on acei / smoker  Chief Complaint  Patient presents with  . Advice Only    Referred by Dr. Eden Emms for increasing SOB X1 year.    indolent onset doe x one year somewhat variable with coughing never tried inhalers Baseline=  Sob when Get in a hurry, steps but other days when coughing more gets more sob to point where occurs x across the room Cough is dry/ sporadic/ daytime assoc with mild hoarseness rec Stop altace and replace with Diovan 160 mg daily  The key is to stop smoking completely before smoking completely stops you - it's not too late in your case    02/04/2015 f/u ov/[facility] re: GOLD II copd/ variable doe no change off acei/ still smoking  Chief Complaint  Patient presents with  . Follow-up    Pt states that his breathing is unchanged since the last visit. No new co's today.   doe x mailbox and back is maybe 150 yards some hills involved, does it s stopping but sob up 3 steps only to landing then has to rest at landing  Cough and hoarseness better off acei   No obvious day to day or daytime variabilty or assoc chronic cough or cp or chest tightness, subjective wheeze overt sinus or hb symptoms. No unusual exp hx or h/o childhood pna/ asthma or knowledge of premature birth.  Sleeping ok without nocturnal  or early am exacerbation  of respiratory  c/o's or need for noct saba. Also denies any obvious fluctuation of symptoms with weather or environmental changes or other aggravating or alleviating factors except as outlined above   Current Medications, Allergies, Complete Past Medical History, Past Surgical History, Family History, and Social History were  reviewed in [employer] record.  ROS  The following are not active complaints unless bolded sore throat, dysphagia, dental problems, itching, sneezing,  nasal congestion or excess/ purulent secretions, ear ache,   fever, chills, sweats, unintended wt loss, pleuritic or exertional cp, hemoptysis,  orthopnea pnd or leg swelling, presyncope, palpitations, heartburn, abdominal pain, anorexia, nausea, vomiting, diarrhea  or change in bowel or urinary habits, change in stools or urine, dysuria,hematuria,  rash, arthralgias, visual complaints, headache, numbness weakness or ataxia or problems with walking or coordination,  change in mood/affect or memory.                              Objective:   Physical Exam   amb wm nad  02/04/2015       189  Wt Readings from Last 3 Encounters:  12/24/14 191 lb (86.637 kg)  12/05/14 185 lb (83.915 kg)  11/27/14 187 lb (84.823 kg)    Vital signs reviewed   HEENT: nl dentition, turbinates, and orophanx. Nl external ear canals without cough reflex   NECK :  without JVD/Nodes/TM/ nl carotid upstrokes bilaterally   LUNGS: no acc muscle use,slt decreased bs  without cough on insp or exp maneuvers   CV:  RRR  no s3 or murmur or increase in P2, no edema  ABD:  soft and nontender with nl excursion in the supine position. No bruits or organomegaly, bowel sounds nl  MS:  warm without deformities, calf tenderness, cyanosis or clubbing  SKIN: warm and dry without lesions    NEURO:  alert, approp, no deficits   CXR:  11/27/14 I personally reviewed images and agree with radiology impression as follows:   COPD. There is no active cardiopulmonary disease.          Assessment & Plan:

## 2015-02-04 NOTE — Patient Instructions (Addendum)
Dulera 100 Take 2 puffs first thing in am  To see if helps your activity tolerance and if so fill the prescription   Work on inhaler technique:  relax and gently blow all the way out then take a nice smooth deep breath back in, triggering the inhaler at same time you start breathing in.  Hold for up to 5 seconds if you can.  Rinse and gargle with water when done  The key is to stop smoking completely before smoking completely stops you - it's not too late      Please schedule a follow up office visit in 6 weeks, sooner if needed

## 2015-02-08 NOTE — Assessment & Plan Note (Signed)
-   PFTs FEV1 12/24/14 2.17 (60%) ratio 52 s reversibility  and DLCO 36 and 37% corrects for alv vol  - 12/24/2014  Walked RA x 3 laps @ 185 ft each stopped due to  End of study, moderate pace, no  sob even at end with sat still 89%  - rec trial off acei starting 12/25/2014 >  Cough and hoarseness better    I had an extended discussion with the patient reviewing all relevant studies completed to date and  lasting 15 to 20 minutes of a 25 minute visit on the following ongoing concerns:  1)  May not see much improvement regardless of rx if doesn't stop smoking (see sep a/p)  2) reasonable to try laba here and given samples of dulera on a trial basis and if not better try lama next   3) 02/04/2015 p extensive coaching HFA effectiveness =    75% so try dulera 100 2bid   4)  Each maintenance medication was reviewed in detail including most importantly the difference between maintenance and as needed and under what circumstances the prns are to be used.  Please see instructions for details which were reviewed in writing and the patient given a copy.

## 2015-02-08 NOTE — Assessment & Plan Note (Signed)
Trial off acei 12/25/2014 > 3/141/16  Less hoarseness/cough  Adequate control on present rx, reviewed > no change in rx needed  For now

## 2015-02-08 NOTE — Assessment & Plan Note (Signed)

## 2015-03-18 ENCOUNTER — Encounter: Payer: Self-pay | Admitting: Internal Medicine

## 2015-03-18 ENCOUNTER — Ambulatory Visit (INDEPENDENT_AMBULATORY_CARE_PROVIDER_SITE_OTHER): Payer: Medicare Other | Admitting: Internal Medicine

## 2015-03-18 VITALS — BP 120/82 | HR 89 | Ht 72.0 in | Wt 192.0 lb

## 2015-03-18 DIAGNOSIS — J449 Chronic obstructive pulmonary disease, unspecified: Secondary | ICD-10-CM

## 2015-03-18 DIAGNOSIS — Z23 Encounter for immunization: Secondary | ICD-10-CM | POA: Diagnosis not present

## 2015-03-18 MED ORDER — PNEUMOCOCCAL 13-VAL CONJ VACC IM SUSP
0.5000 mL | Freq: Once | INTRAMUSCULAR | Status: AC
  Administered 2015-03-18: 0.5 mL via INTRAMUSCULAR

## 2015-03-18 MED ORDER — MOMETASONE FURO-FORMOTEROL FUM 200-5 MCG/ACT IN AERO: INHALATION_SPRAY | RESPIRATORY_TRACT | Status: DC

## 2015-03-18 NOTE — Patient Instructions (Signed)
prevnar 13 today   I strongly [facility] you [facility]   If you are satisfied with your treatment plan,  let your doctor know and he/she can either refill your medications or you can return here when your prescription runs out.     If in any way you are not 100% satisfied,  please tell us.  If 100% better, tell your friends!  Pulmonary follow up is as needed

## 2015-03-18 NOTE — Progress Notes (Signed)
Subjective:    Patient ID: Jaime Pierce, male    DOB: 03-Dec-1945,    MRN: [id number]    Brief patient profile:  [age] yowm active smoker with doe x 2015 just with exertion  assoc dry cough referred by Dr Eden EmmsNishan to pulmonary clinic 12/24/14 with GOLD II copd    History of Present Illness  12/24/2014 1st [place] [facility] office visit/ [facility] / on acei / smoker  Chief Complaint  Patient presents with  . Advice Only    Referred by Dr. Eden EmmsNishan for increasing SOB X1 year.    indolent onset doe x one year somewhat variable with coughing never tried inhalers Baseline=  Sob when Get in a hurry, steps but other days when coughing more gets more sob to point where occurs x across the room Cough is dry/ sporadic/ daytime assoc with mild hoarseness rec Stop altace and replace with Diovan 160 mg daily  The key is to stop smoking completely before smoking completely stops you - it's not too late in your case    02/04/2015 f/u ov/[facility] re: GOLD II copd/ variable doe no change off acei/ still smoking  Chief Complaint  Patient presents with  . Follow-up    Pt states that his breathing is unchanged since the last visit. No new co's today.   doe x mailbox and back is maybe 150 yards some hills involved, does it s stopping but sob up 3 steps only to landing then has to rest at landing  Cough and hoarseness better off acei  rec Dulera 100 Take 2 puffs first thing in am  To see if helps your activity tolerance and if so fill the prescription  Work on inhaler technique:    The key is to stop smoking completely before smoking completely stops you - it's not too late       03/18/2015 f/u ov/[facility] re: GOLD II copd / still smoking/ dulera 100 2bid maint but not consistent thinks 200 was better  Chief Complaint  Patient presents with  . Follow-up    Pt states that his breathing has improved since the last visit. MMRC Grade 1.  Not limited by breathing from desired activities     No obvious day to day or  daytime variabilty or assoc chronic cough or cp or chest tightness, subjective wheeze overt sinus or hb symptoms. No unusual exp hx or h/o childhood pna/ asthma or knowledge of premature birth.  Sleeping ok without nocturnal  or early am exacerbation  of respiratory  c/o's or need for noct saba. Also denies any obvious fluctuation of symptoms with weather or environmental changes or other aggravating or alleviating factors except as outlined above   Current Medications, Allergies, Complete Past Medical History, Past Surgical History, Family History, and Social History were reviewed in [facility].  ROS  The following are not active complaints unless bolded sore throat, dysphagia, dental problems, itching, sneezing,  nasal congestion or excess/ purulent secretions, ear ache,   fever, chills, sweats, unintended wt loss, pleuritic or exertional cp, hemoptysis,  orthopnea pnd or leg swelling, presyncope, palpitations, heartburn, abdominal pain, anorexia, nausea, vomiting, diarrhea  or change in bowel or urinary habits, change in stools or urine, dysuria,hematuria,  rash, arthralgias, visual complaints, headache, numbness weakness or ataxia or problems with walking or coordination,  change in mood/affect or memory.                     Objective:  Physical Exam   amb wm nad  02/04/2015       189  >  03/18/2015  192  Wt Readings from Last 3 Encounters:  12/24/14 191 lb (86.637 kg)  12/05/14 185 lb (83.915 kg)  11/27/14 187 lb (84.823 kg)    Vital signs reviewed   HEENT: nl dentition, turbinates, and orophanx. Nl external ear canals without cough reflex   NECK :  without JVD/Nodes/TM/ nl carotid upstrokes bilaterally   LUNGS: no acc muscle use, slt decreased bs bilaterally   without cough on insp or exp maneuvers   CV:  RRR  no s3 or murmur or increase in P2, no edema   ABD:  soft and nontender with nl excursion in the supine position. No bruits or  organomegaly, bowel sounds nl  MS:  warm without deformities, calf tenderness, cyanosis or clubbing  SKIN: warm and dry without lesions    NEURO:  alert, approp, no deficits   CXR:  11/27/14 I personally reviewed images and agree with radiology impression as follows:   COPD. There is no active cardiopulmonary disease.          Assessment & Plan:

## 2015-03-18 NOTE — Assessment & Plan Note (Addendum)
-   PFTs FEV1 12/24/14 2.17 (60%) ratio 52 s reversibility  and DLCO 36 and 37% corrects for alv vol  - 12/24/2014  Walked RA x 3 laps @ 185 ft each stopped due to  End of study, moderate pace, no  sob even at end with sat still 89%  - rec trial off acei starting 12/25/2014 > better cough/ voice, no change doe - 02/04/2015  try dulera 100 2bid    The proper method of use, as well as anticipated side effects, of a metered-dose inhaler are discussed and demonstrated to the patient. Improved effectiveness after extensive coaching during this visit to a level of approximately   90%   I had an extended final summary discussion with the patient reviewing all relevant studies completed to date and  lasting 15 to 20 minutes of a 25 minute visit on the following issues:     1) GOLD II criteria will continue to decline if doesn't stop smoking   2) ok to titrate pm dose of dulera but not the am dose but should be on the 200   3) Formulary restrictions will be an ongoing challenge for the forseable future and I would be happy to pick an alternative if the pt will first  provide me a list of them but pt  will need to return here for training for any new device that is required eg dpi vs hfa vs respimat.    In meantime we can always provide samples so the patient never runs out of any needed respiratory medications.    4) no need for pulmonary f/u but would like him to get primary doctor in system and in meantime needs prevnar 13 > given.

## 2015-04-08 ENCOUNTER — Other Ambulatory Visit: Payer: Self-pay | Admitting: Cardiovascular Disease

## 2015-11-14 ENCOUNTER — Other Ambulatory Visit: Payer: Self-pay | Admitting: Cardiovascular Disease

## 2015-12-04 ENCOUNTER — Other Ambulatory Visit: Payer: Self-pay | Admitting: Cardiovascular Disease

## 2015-12-04 ENCOUNTER — Other Ambulatory Visit: Payer: Self-pay | Admitting: Internal Medicine

## 2015-12-18 ENCOUNTER — Telehealth: Payer: Self-pay | Admitting: Internal Medicine

## 2015-12-18 NOTE — Telephone Encounter (Signed)
lmtcb x1 for pt. 

## 2015-12-20 NOTE — Telephone Encounter (Signed)
Pt brought in papers i gave to triage

## 2015-12-20 NOTE — Telephone Encounter (Signed)
Spoke with patient, Received letter in mail stating that Jaime Pierce is not covered.  Insurance changed as of Nov 24, 2015. States that the letter states that the prescriber can also write a letter or contact insurance company and give statement of necessity for patient to remain on this drug.  The letter also recommends some alternatives if letter can not be done, which are Advair and Breo.  Patient is bringing letter up to our office today so that we can verify this information along with contact information of who to speak with about the letter/statement of necessity.  Will hold in triage until letter dropped off.

## 2015-12-23 NOTE — Telephone Encounter (Signed)
Checked in [place] folder in triage, did not see letter, Ashtyn have you received this letter? Please advise.

## 2015-12-23 NOTE — Telephone Encounter (Signed)
Chan please advise where you placed this or who you may have given it to as triage does not see this letter.

## 2015-12-25 MED ORDER — FLUTICASONE-SALMETEROL 115-21 MCG/ACT IN AERO: 2.0000 | INHALATION_SPRAY | Freq: Two times a day (BID) | RESPIRATORY_TRACT | Status: DC

## 2015-12-25 NOTE — Telephone Encounter (Signed)
Spoke with pt and advised of Dr Thurston Hole recommendations.  Rx for Advair HFA sent to pharmacy.  Pt states he will finish up current supply of Dulera and then try the Advair.

## 2015-12-25 NOTE — Telephone Encounter (Signed)
advair 115 hfa 2 bid need to be tried first - if not doing as well on this then ov with all meds in hand and we'll regroup

## 2015-12-25 NOTE — Telephone Encounter (Signed)
I have not seen any letter  Dr Sherene Sires, have you seen letter about his Elwin Sleight? Looks like alternatives are breo and advair  Should we worry with letter, or have the pt try alternative?  Last AVS: prevnar 13 today   I strongly recommend you get a primary care doctor at [facility]  If you are satisfied with your treatment plan, let your doctor know and he/she can either refill your medications or you can return here when your prescription runs out.   If in any way you are not 100% satisfied, please tell us. If 100% better, tell your friends!  Pulmonary follow up is as needed

## 2016-02-19 ENCOUNTER — Telehealth: Payer: Self-pay | Admitting: Internal Medicine

## 2016-02-19 NOTE — Telephone Encounter (Signed)
Per 03/18/15 OV: Patient Instructions       prevnar 13 today  I strongly [facility]  If you are satisfied with your treatment plan,  let your doctor know and he/she can either refill your medications or you can return here when your prescription runs out.    If in any way you are not 100% satisfied,  please tell us.  If 100% better, tell your friends! Pulmonary follow up is as needed  --  Called spoke with pt. It has been almost over 1 year since pt last seen. He scheduled visit with MW on 02/21/16. Nothing further needed

## 2016-02-21 ENCOUNTER — Ambulatory Visit (INDEPENDENT_AMBULATORY_CARE_PROVIDER_SITE_OTHER): Payer: Medicare Other | Admitting: Internal Medicine

## 2016-02-21 ENCOUNTER — Encounter: Payer: Self-pay | Admitting: Internal Medicine

## 2016-02-21 VITALS — BP 138/78 | HR 82 | Ht 72.0 in | Wt 192.0 lb

## 2016-02-21 DIAGNOSIS — J449 Chronic obstructive pulmonary disease, unspecified: Secondary | ICD-10-CM

## 2016-02-21 DIAGNOSIS — F17210 Nicotine dependence, cigarettes, uncomplicated: Secondary | ICD-10-CM

## 2016-02-21 DIAGNOSIS — Z720 Tobacco use: Secondary | ICD-10-CM | POA: Diagnosis not present

## 2016-02-21 MED ORDER — ALBUTEROL SULFATE HFA 108 (90 BASE) MCG/ACT IN AERS: INHALATION_SPRAY | RESPIRATORY_TRACT | Status: DC

## 2016-02-21 MED ORDER — BUDESONIDE-FORMOTEROL FUMARATE 160-4.5 MCG/ACT IN AERO: INHALATION_SPRAY | RESPIRATORY_TRACT | Status: DC

## 2016-02-21 NOTE — Progress Notes (Signed)
Subjective:    Patient ID: Jaime Pierce, male    DOB: 08/02/1946,    MRN: [id number]    Brief patient profile:  [age] yowm active smoker with doe x 2015 just with exertion  assoc dry cough referred by Dr Eden EmmsNishan to pulmonary clinic 12/24/14 with GOLD II copd    History of Present Illness  12/24/2014 1st [place] [facility] office visit/ [place] / on acei / smoker  Chief Complaint  Patient presents with  . Advice Only    Referred by Dr. Eden EmmsNishan for increasing SOB X1 year.    indolent onset doe x one year somewhat variable with coughing never tried inhalers Baseline=  Sob when Get in a hurry, steps but other days when coughing more gets more sob to point where occurs x across the room Cough is dry/ sporadic/ daytime assoc with mild hoarseness rec Stop altace and replace with Diovan 160 mg daily  The key is to stop smoking completely before smoking completely stops you - it's not too late in your case    02/04/2015 f/u ov/Muhammadali Ries re: GOLD II copd/ variable doe no change off acei/ still smoking  Chief Complaint  Patient presents with  . Follow-up    Pt states that his breathing is unchanged since the last visit. No new co's today.   doe x mailbox and back is maybe 150 yards some hills involved, does it s stopping but sob up 3 steps only to landing then has to rest at landing  Cough and hoarseness better off acei  rec Dulera 100 Take 2 puffs first thing in am  To see if helps your activity tolerance and if so fill the prescription  Work on inhaler technique:    The key is to stop smoking completely before smoking completely stops you - it's not too late       03/18/2015 f/u ov/Alcee Sipos re: GOLD II copd / still smoking/ dulera 100 2bid maint but not consistent thinks 200 was better  Chief Complaint  Patient presents with  . Follow-up    Pt states that his breathing has improved since the last visit. MMRC Grade 1.  Not limited by breathing from desired activities   rec prevnar 13 today  I  strongly recommend you [facility]    02/21/2016  f/u ov/Tanaiya Kolarik re: GOLD II copd/ still smoking  Chief Complaint  Patient presents with  . Follow-up    Pt last seen in 02/2015 and was advised to f/u as needed. Pt states his insurance no longer covered Dulera but rather Advair. Pt states he is taking Advair and states his SOB is not controlled as it once was on Dulera. Pt c/o increase in SOB, prod cough with green mucus, wheezing. Pt denies CP/tightness.   green mucus x one week assoc with nasal congestion / improving s rx "feels like a head cold getting better" MMRC2 = can't walk a nl pace on a flat grade s sob on advair but as long as flat and takes his time Not limited by breathing from desired activities However, he senses advair not as good as dulera overall in terms of benefit on breathing       No obvious day to day or daytime variabilty or assoc chronic cough or cp or chest tightness, subjective wheeze overt sinus or hb symptoms. No unusual exp hx or h/o childhood pna/ asthma or knowledge of premature birth.  Sleeping ok without nocturnal  or early  am exacerbation  of respiratory  c/o's or need for noct saba. Also denies any obvious fluctuation of symptoms with weather or environmental changes or other aggravating or alleviating factors except as outlined above   Current Medications, Allergies, Complete Past Medical History, Past Surgical History, Family History, and Social History were reviewed in [employer] record.  ROS  The following are not active complaints unless bolded sore throat, dysphagia, dental problems, itching, sneezing,  nasal congestion or excess/ purulent secretions, ear ache,   fever, chills, sweats, unintended wt loss, pleuritic or exertional cp, hemoptysis,  orthopnea pnd or leg swelling, presyncope, palpitations, heartburn, abdominal pain, anorexia, nausea, vomiting, diarrhea  or change in bowel or urinary habits,  change in stools or urine, dysuria,hematuria,  rash, arthralgias, visual complaints, headache, numbness weakness or ataxia or problems with walking or coordination,  change in mood/affect or memory.           Objective:   Physical Exam   amb wm nad  02/04/2015       189  >  03/18/2015  192 > 02/21/2016  192     12/24/14 191 lb (86.637 kg)  12/05/14 185 lb (83.915 kg)  11/27/14 187 lb (84.823 kg)    Vital signs reviewed   HEENT: nl dentition, turbinates, and orophanx. Nl external ear canals without cough reflex   NECK :  without JVD/Nodes/TM/ nl carotid upstrokes bilaterally   LUNGS: no acc muscle use, slt decreased bs bilaterally   without cough on insp or exp maneuvers   CV:  RRR  no s3 or murmur or increase in P2, no edema   ABD:  soft and nontender with nl excursion in the supine position. No bruits or organomegaly, bowel sounds nl  MS:  warm without deformities, calf tenderness, cyanosis or clubbing  SKIN: warm and dry without lesions    NEURO:  alert, approp, no deficits   CXR:  11/27/14 I personally reviewed images and agree with radiology impression as follows:   COPD. There is no active cardiopulmonary disease.          Assessment & Plan:   Outpatient Encounter Prescriptions as of 02/21/2016  Medication Sig  . aspirin 81 MG tablet Take 81 mg by mouth daily.    [name] Kitchen atorvastatin (LIPITOR) 40 MG tablet TAKE 1 TABLET (40 MG TOTAL) BY MOUTH DAILY.  . cetirizine (ZYRTEC) 10 MG tablet Take 10 mg by mouth daily.  . fluticasone-salmeterol (ADVAIR HFA) 115-21 MCG/ACT inhaler Inhale 2 puffs into the lungs 2 (two) times daily.  . metoprolol (LOPRESSOR) 50 MG tablet TAKE 1/2 TABLET BY MOUTH TWICE DAILY  . valsartan (DIOVAN) 160 MG tablet Take 1 tablet (160 mg total) by mouth daily.  [name] Kitchen albuterol (PROAIR HFA) 108 (90 Base) MCG/ACT inhaler 2 puffs every 4 hours as needed only  if your can't catch your breath  . budesonide-formoterol (SYMBICORT) 160-4.5 MCG/ACT inhaler  Take 2 puffs first thing in am and then another 2 puffs about 12 hours later.  . nitroGLYCERIN (NITROSTAT) 0.4 MG SL tablet Place 1 tablet (0.4 mg total) under the tongue every 5 (five) minutes as needed for chest pain.   No facility-administered encounter medications on file as of 02/21/2016.

## 2016-02-21 NOTE — Patient Instructions (Addendum)
Plan A = Automatic = symbicort or dulera  Take 2 puffs first thing in am and then another 2 puffs about 12 hours later.   Plan B = Backup Only use your albuterol (PROAIR)  as a rescue medication to be used if you can't catch your breath by resting or doing a relaxed purse lip breathing pattern.  - The less you use it, the better it will work when you need it. - Ok to use up to 2 puffs  every 4 hours if you must but call for appointment if use goes up over your usual need - Don't leave home without it !!  (think of it like the spare tire for your car)   The key is to stop smoking completely before smoking completely stops you!    If you are satisfied with your treatment plan,  let your doctor know and he/she can either refill your medications or you can return here when your prescription runs out.     If in any way you are not 100% satisfied,  please tell us.  If 100% better, tell your friends!  Pulmonary follow up is as needed

## 2016-02-23 DIAGNOSIS — F17210 Nicotine dependence, cigarettes, uncomplicated: Secondary | ICD-10-CM | POA: Insufficient documentation

## 2016-02-23 NOTE — Assessment & Plan Note (Signed)
-   PFTs FEV1 12/24/14 2.17 (60%) ratio 52 s reversibility  and DLCO 36 and 37% corrects for alv vol  - 12/24/2014  Walked RA x 3 laps @ 185 ft each stopped due to  End of study, moderate pace, no  sob even at end with sat still 89%  - rec trial off acei starting 12/25/2014 > better cough/ voice, no change doe - 02/04/2015  try dulera 100 2bid  - 02/23/2016 p extensive coaching HFA effectiveness =    90%  I had an extended final summary discussion with the patient reviewing all relevant studies completed to date and  lasting 15 to 20 minutes of a 25 minute visit on the following issues:    1) it's the smoking,not the choice of inhalers that's really the issue (see separate a/p)   2) Formulary restrictions will be an ongoing challenge for the forseable future and I would be happy to pick an alternative if the pt will first  provide me a list of them but pt  will need to return here for training for any new device that is required eg dpi vs hfa vs respimat.    In meantime we can always provide samples so the patient never runs out of any needed respiratory medications.   3) Each maintenance medication was reviewed in detail including most importantly the difference between maintenance and as needed and under what circumstances the prns are to be used.  Please see instructions for details which were reviewed in writing and the patient given a copy.

## 2016-02-23 NOTE — Assessment & Plan Note (Addendum)
>   3 min discussion I reviewed the Fletcher curve with the patient that basically indicates  if you quit smoking when your best day FEV1 is still well preserved (as is clearly  the case here)  it is highly unlikely you will progress to severe disease and informed the patient there was no medication on the market that has proven to alter the curve/ its downward trajectory  or the likelihood of progression of their disease.  Therefore stopping smoking and maintaining abstinence is the most important aspect of care, not choice of inhalers or for that matter, doctors.    Although I don't endorse regular use of e cigs/ many pts find them helpful; however, I emphasized they should be considered a "one-way bridge" off all tobacco products.   Pulmonary f/u is as needed

## 2016-03-16 ENCOUNTER — Telehealth: Payer: Self-pay | Admitting: Internal Medicine

## 2016-03-16 NOTE — Telephone Encounter (Signed)
Give 2 boxes of symbicort and return with formulary in hand before these run out as these are not interchangeable alternatives

## 2016-03-16 NOTE — Telephone Encounter (Signed)
Called and spoke to pt. Informed him of the recs per MW. Appt made with MW on 5.17.17 and samples have been placed up front for pick up. Pt verbalized understanding and denied any further questions or concerns at this time.

## 2016-03-16 NOTE — Telephone Encounter (Signed)
Spoke with pt, states that his dulera had originally been denied by insurance, must try insurance approved alternatives Symbicort or Breo first.  Pt states he tried to get his symbicort rx sent to pharmacy, but pharmacist states that this was not covered on pt's formulary either.   Pt uses [facility] on [address]   Called Pharmacy, states that pt must try and fail advair discus or breo before either symbicort or dulera will be covered.    MW please advise.  Thanks!    Patient Instructions       Plan A = Automatic = symbicort or dulera  Take 2 puffs first thing in am and then another 2 puffs about 12 hours later.   Plan B = Backup Only use your albuterol (PROAIR)  as a rescue medication to be used if you can't catch your breath by resting or doing a relaxed purse lip breathing pattern.   - The less you use it, the better it will work when you need it. - Ok to use up to 2 puffs  every 4 hours if you must but call for appointment if use goes up over your usual need - Don't leave home without it !!  (think of it like the spare tire for your car)   The key is to stop smoking completely before smoking completely stops you!    If you are satisfied with your treatment plan,  let your doctor know and he/she can either refill your medications or you can return here when your prescription runs out.     If in any way you are not 100% satisfied,  please tell us.  If 100% better, tell your friends!  Pulmonary follow up is as needed

## 2016-03-17 ENCOUNTER — Ambulatory Visit (INDEPENDENT_AMBULATORY_CARE_PROVIDER_SITE_OTHER): Payer: Medicare Other | Admitting: Internal Medicine

## 2016-03-17 ENCOUNTER — Encounter: Payer: Self-pay | Admitting: Internal Medicine

## 2016-03-17 VITALS — BP 134/88 | HR 62 | Ht 72.0 in | Wt 192.0 lb

## 2016-03-17 DIAGNOSIS — F17210 Nicotine dependence, cigarettes, uncomplicated: Secondary | ICD-10-CM

## 2016-03-17 DIAGNOSIS — Z23 Encounter for immunization: Secondary | ICD-10-CM | POA: Diagnosis not present

## 2016-03-17 DIAGNOSIS — I10 Essential (primary) hypertension: Secondary | ICD-10-CM

## 2016-03-17 DIAGNOSIS — Z720 Tobacco use: Secondary | ICD-10-CM | POA: Diagnosis not present

## 2016-03-17 DIAGNOSIS — I2510 Atherosclerotic heart disease of native coronary artery without angina pectoris: Secondary | ICD-10-CM | POA: Diagnosis not present

## 2016-03-17 DIAGNOSIS — J449 Chronic obstructive pulmonary disease, unspecified: Secondary | ICD-10-CM | POA: Diagnosis not present

## 2016-03-17 DIAGNOSIS — I2583 Coronary atherosclerosis due to lipid rich plaque: Secondary | ICD-10-CM

## 2016-03-17 DIAGNOSIS — Z0000 Encounter for general adult medical examination without abnormal findings: Secondary | ICD-10-CM

## 2016-03-17 MED ORDER — METOPROLOL TARTRATE 50 MG PO TABS: 25.0000 mg | ORAL_TABLET | Freq: Two times a day (BID) | ORAL | Status: DC

## 2016-03-17 MED ORDER — VITAMIN D 1000 UNITS PO TABS: 1000.0000 [IU] | ORAL_TABLET | Freq: Every day | ORAL | Status: AC

## 2016-03-17 MED ORDER — ATORVASTATIN CALCIUM 40 MG PO TABS: 40.0000 mg | ORAL_TABLET | Freq: Every day | ORAL | Status: DC

## 2016-03-17 MED ORDER — VALSARTAN 160 MG PO TABS: 160.0000 mg | ORAL_TABLET | Freq: Every day | ORAL | Status: DC

## 2016-03-17 NOTE — Progress Notes (Signed)
Pre visit review using our clinic review tool, if applicable. No additional management support is needed unless otherwise documented below in the visit note. 

## 2016-03-17 NOTE — Assessment & Plan Note (Signed)
  Pt cont to smoke - discussed

## 2016-03-17 NOTE — Progress Notes (Signed)
Subjective:  Patient ID: Jaime Pierce, male    DOB: [birth year]  Age: 70 y.o. MRN: [id number]  CC: No chief complaint on file.   HPI Jaime Pierce presents for new pt OV. Pt is being treated for COPD - issues with meds (not covered). He has CAD, dyslipidemia - meds reviewed. The pt cont to smoke  Outpatient Prescriptions Prior to Visit  Medication Sig Dispense Refill  . albuterol (PROAIR HFA) 108 (90 Base) MCG/ACT inhaler 2 puffs every 4 hours as needed only  if your can't catch your breath 1 Inhaler 1  . aspirin 81 MG tablet Take 81 mg by mouth daily.      [name] Kitchen atorvastatin (LIPITOR) 40 MG tablet TAKE 1 TABLET (40 MG TOTAL) BY MOUTH DAILY. 90 tablet 0  . budesonide-formoterol (SYMBICORT) 160-4.5 MCG/ACT inhaler Take 2 puffs first thing in am and then another 2 puffs about 12 hours later. 1 Inhaler 11  . cetirizine (ZYRTEC) 10 MG tablet Take 10 mg by mouth daily.    . fluticasone-salmeterol (ADVAIR HFA) 115-21 MCG/ACT inhaler Inhale 2 puffs into the lungs 2 (two) times daily. 1 Inhaler 6  . metoprolol (LOPRESSOR) 50 MG tablet TAKE 1/2 TABLET BY MOUTH TWICE DAILY 90 tablet 0  . valsartan (DIOVAN) 160 MG tablet Take 1 tablet (160 mg total) by mouth daily. 30 tablet 11  . nitroGLYCERIN (NITROSTAT) 0.4 MG SL tablet Place 1 tablet (0.4 mg total) under the tongue every 5 (five) minutes as needed for chest pain. 100 tablet 3   No facility-administered medications prior to visit.    ROS Review of Systems  Constitutional: Negative for appetite change, fatigue and unexpected weight change.  HENT: Negative for congestion, nosebleeds, sneezing, sore throat and trouble swallowing.   Eyes: Negative for itching and visual disturbance.  Respiratory: Negative for cough.   Cardiovascular: Negative for chest pain, palpitations and leg swelling.  Gastrointestinal: Negative for nausea, diarrhea, blood in stool and abdominal distention.  Genitourinary: Negative for frequency and hematuria.    Musculoskeletal: Negative for back pain, joint swelling, gait problem and neck pain.  Skin: Negative for rash.  Neurological: Negative for dizziness, tremors, speech difficulty and weakness.  Psychiatric/Behavioral: Negative for suicidal ideas, sleep disturbance, dysphoric mood and agitation. The patient is not nervous/anxious.     Objective:  BP 134/88 mmHg  Pulse 62  Ht 6' (1.829 m)  Wt 192 lb (87.091 kg)  BMI 26.03 kg/m2  SpO2 96%  BP Readings from Last 3 Encounters:  03/17/16 134/88  02/21/16 138/78  03/18/15 120/82    Wt Readings from Last 3 Encounters:  03/17/16 192 lb (87.091 kg)  02/21/16 192 lb (87.091 kg)  03/18/15 192 lb (87.091 kg)    Physical Exam  Constitutional: He is oriented to person, place, and time. He appears well-developed. No distress.  NAD  HENT:  Mouth/Throat: Oropharynx is clear and moist.  Eyes: Conjunctivae are normal. Pupils are equal, round, and reactive to light.  Neck: Normal range of motion. No JVD present. No thyromegaly present.  Cardiovascular: Normal rate, regular rhythm, normal heart sounds and intact distal pulses.  Exam reveals no gallop and no friction rub.   No murmur heard. Pulmonary/Chest: Effort normal and breath sounds normal. No respiratory distress. He has no wheezes. He has no rales. He exhibits no tenderness.  Abdominal: Soft. Bowel sounds are normal. He exhibits no distension and no mass. There is no tenderness. There is no rebound and no guarding.  Musculoskeletal: Normal range of  motion. He exhibits no edema or tenderness.  Lymphadenopathy:    He has no cervical adenopathy.  Neurological: He is alert and oriented to person, place, and time. He has normal reflexes. No cranial nerve deficit. He exhibits normal muscle tone. He displays a negative Romberg sign. Coordination and gait normal.  Skin: Skin is warm and dry. No rash noted.  Psychiatric: He has a normal mood and affect. His behavior is normal. Judgment and thought  content normal.    Lab Results  Component Value Date   GLUCOSE 107* 11/25/2012   CHOL 109 11/25/2012   TRIG 62.0 11/25/2012   HDL 28.90* 11/25/2012   LDLCALC 68 11/25/2012   ALT 20 11/25/2012   AST 13 11/25/2012   NA 139 11/25/2012   K 4.1 11/25/2012   CL 106 11/25/2012   CREATININE 0.9 11/25/2012   BUN 19 11/25/2012   CO2 26 11/25/2012   PSA 1.50 11/25/2012    Dg Chest 2 View  11/27/2014  CLINICAL DATA:  History of COPD and coronary artery disease and continued tobacco use EXAM: CHEST  2 VIEW COMPARISON:  PA and lateral chest of October 19, 2012 FINDINGS: The lungs remain hyperinflated with hemidiaphragm flattening. The pulmonary interstitial markings remain increased bilaterally. The cardiac silhouette and pulmonary vascularity are unremarkable. The patient has undergone previous CABG. There are 7 intact sternal wires present. There is mild tortuosity of the descending thoracic aorta. There is mild degenerative disc space narrowing in the mid and lower thoracic spine. IMPRESSION: COPD.  There is no active cardiopulmonary disease. Electronically Signed   By: David  [place]   On: 11/27/2014 14:50    Assessment & Plan:   Diagnoses and all orders for this visit:  Essential hypertension -     valsartan (DIOVAN) 160 MG tablet; Take 1 tablet (160 mg total) by mouth daily.  Other orders -     atorvastatin (LIPITOR) 40 MG tablet; Take 1 tablet (40 mg total) by mouth daily. -     metoprolol (LOPRESSOR) 50 MG tablet; Take 0.5 tablets (25 mg total) by mouth 2 (two) times daily.   I am having [name] maintain his aspirin, nitroGLYCERIN, valsartan, cetirizine, atorvastatin, metoprolol, fluticasone-salmeterol, budesonide-formoterol, and albuterol.  No orders of the defined types were placed in this encounter.     Follow-up: No Follow-up on file.  Sonda PrimesAlex Salsabeel Gorelick, MD

## 2016-03-17 NOTE — Assessment & Plan Note (Signed)
Dr Sherene SiresWert Pt cont to smoke - discussed On Symbicort

## 2016-04-08 ENCOUNTER — Ambulatory Visit (INDEPENDENT_AMBULATORY_CARE_PROVIDER_SITE_OTHER): Payer: Medicare Other | Admitting: Internal Medicine

## 2016-04-08 ENCOUNTER — Encounter: Payer: Self-pay | Admitting: Internal Medicine

## 2016-04-08 VITALS — BP 90/60 | HR 69 | Ht 72.0 in | Wt 189.6 lb

## 2016-04-08 DIAGNOSIS — J449 Chronic obstructive pulmonary disease, unspecified: Secondary | ICD-10-CM

## 2016-04-08 DIAGNOSIS — Z720 Tobacco use: Secondary | ICD-10-CM | POA: Diagnosis not present

## 2016-04-08 DIAGNOSIS — F17210 Nicotine dependence, cigarettes, uncomplicated: Secondary | ICD-10-CM

## 2016-04-08 MED ORDER — FLUTICASONE FUROATE-VILANTEROL 100-25 MCG/INH IN AEPB: 1.0000 | INHALATION_SPRAY | Freq: Every morning | RESPIRATORY_TRACT | Status: DC

## 2016-04-08 NOTE — Patient Instructions (Addendum)
Plan A = Automatic = BREO 100 take one click and 2 drags every am then do your dental care - if you like it, fill it, otherwise let us know and we'll try to get you approved for symbicort or dulera   Plan B = Backup Only use your albuterol as a rescue medication to be used if you can't catch your breath by resting or doing a relaxed purse lip breathing pattern.  - The less you use it, the better it will work when you need it. - Ok to use the inhaler up to 2 puffs  every 4 hours if you must but call for appointment if use goes up over your usual need - Don't leave home without it !!  (think of it like the spare tire for your car)   Please schedule a follow up visit in 3 months but call sooner if needed

## 2016-04-08 NOTE — Progress Notes (Signed)
Subjective:    Patient ID: Jaime Pierce, male    DOB: November 14, 1946,    MRN: [id number]    Brief patient profile:  28 yowm active smoker with doe x 2015 just with exertion  assoc dry cough referred by Dr Eden Emms to pulmonary clinic 12/24/14 with GOLD II copd    History of Present Illness  12/24/2014 1st [facility] office visit/ Jaime Pierce / on acei / smoker  Chief Complaint  Patient presents with  . Advice Only    Referred by Dr. Eden Emms for increasing SOB X1 year.    indolent onset doe x one year somewhat variable with coughing never tried inhalers Baseline=  Sob when Get in a hurry, steps but other days when coughing more gets more sob to point where occurs x across the room Cough is dry/ sporadic/ daytime assoc with mild hoarseness rec Stop altace and replace with Diovan 160 mg daily  The key is to stop smoking completely before smoking completely stops you - it's not too late in your case    02/04/2015 f/u ov/Jaime Pierce re: GOLD II copd/ variable doe no change off acei/ still smoking  Chief Complaint  Patient presents with  . Follow-up    Pt states that his breathing is unchanged since the last visit. No new co's today.   doe x mailbox and back is maybe 150 yards some hills involved, does it s stopping but sob up 3 steps only to landing then has to rest at landing  Cough and hoarseness better off acei  rec Dulera 100 Take 2 puffs first thing in am  To see if helps your activity tolerance and if so fill the prescription  Work on inhaler technique:    The key is to stop smoking completely before smoking completely stops you - it's not too late       03/18/2015 f/u ov/Jaime Pierce re: GOLD II copd / still smoking/ dulera 100 2bid maint but not consistent thinks 200 was better  Chief Complaint  Patient presents with  . Follow-up    Pt states that his breathing has improved since the last visit. MMRC Grade 1.  Not limited by breathing from desired activities   rec prevnar 13 today  I  strongly recommend you get a [facility] care doctor at [facility]    02/21/2016  f/u ov/Jaime Pierce re: GOLD II copd/ still smoking  Chief Complaint  Patient presents with  . Follow-up    Pt last seen in 02/2015 and was advised to f/u as needed. Pt states his insurance no longer covered Dulera but rather Advair. Pt states he is taking Advair and states his SOB is not controlled as it once was on Dulera. Pt c/o increase in SOB, prod cough with green mucus, wheezing. Pt denies CP/tightness.   green mucus x one week assoc with nasal congestion / improving s rx "feels like a head cold getting better" MMRC2 = can't walk a nl pace on a flat grade s sob on advair but as long as flat and takes his time Not limited by breathing from desired activities However, he senses advair not as good as dulera overall in terms of benefit on breathing   rec Plan A = Automatic = symbicort or dulera  Take 2 puffs first thing in am and then another 2 puffs about 12 hours later.  Plan B = Backup Only use your albuterol (PROAIR)  as a rescue medication The key is to stop smoking completely before smoking  completely stops you!    04/08/2016  f/u ov/Jaime Pierce re: copd II/ still smoking and concerned about the cost of his meds = symb 160 2bid but insurance requiring trial of breo/advair first  Chief Complaint  Patient presents with  . Follow-up    Pt here to discuss changing meds- ins does not prefer symbicort- has to try breo or advair   [facility] = can't walk a nl pace on a flat grade s sob but does fine slow and flat eg     No obvious day to day or daytime variabilty or assoc chronic cough or cp or chest tightness, subjective wheeze overt sinus or hb symptoms. No unusual exp hx or h/o childhood pna/ asthma or knowledge of premature birth.  Sleeping ok without nocturnal  or early am exacerbation  of respiratory  c/o's or need for noct saba. Also denies any obvious fluctuation of symptoms with weather or environmental changes or other  aggravating or alleviating factors except as outlined above   Current Medications, Allergies, Complete Past Medical History, Past Surgical History, Family History, and Social History were reviewed in [facility] electronic medical record.  ROS  The following are not active complaints unless bolded sore throat, dysphagia, dental problems, itching, sneezing,  nasal congestion or excess/ purulent secretions, ear ache,   fever, chills, sweats, unintended wt loss, pleuritic or exertional cp, hemoptysis,  orthopnea pnd or leg swelling, presyncope, palpitations, heartburn, abdominal pain, anorexia, nausea, vomiting, diarrhea  or change in bowel or urinary habits, change in stools or urine, dysuria,hematuria,  rash, arthralgias, visual complaints, headache, numbness weakness or ataxia or problems with walking or coordination,  change in mood/affect or memory.           Objective:   Physical Exam   amb wm nad  02/04/2015       189  >  03/18/2015  192 > 02/21/2016  192 > 04/08/2016  190     12/24/14 191 lb (86.637 kg)  12/05/14 185 lb (83.915 kg)  11/27/14 187 lb (84.823 kg)    Vital signs reviewed   HEENT: nl dentition, turbinates, and orophanx. Nl external ear canals without cough reflex   NECK :  without JVD/Nodes/TM/ nl carotid upstrokes bilaterally   LUNGS: no acc muscle use, slt decreased bs bilaterally   without cough on insp or exp maneuvers   CV:  RRR  no s3 or murmur or increase in P2, no edema   ABD:  soft and nontender with nl excursion in the supine position. No bruits or organomegaly, bowel sounds nl  MS:  warm without deformities, calf tenderness, cyanosis or clubbing  SKIN: warm and dry without lesions    NEURO:  alert, approp, no deficits   CXR:  11/27/14 I personally reviewed images and agree with radiology impression as follows:   COPD. There is no active cardiopulmonary disease.          Assessment & Plan:

## 2016-04-08 NOTE — Assessment & Plan Note (Addendum)
-   PFTs FEV1 12/24/14 2.17 (60%) ratio 52 s reversibility  and DLCO 36 and 37% corrects for alv vol  - 12/24/2014  Walked RA x 3 laps @ 185 ft each stopped due to  End of study, moderate pace, no  sob even at end with sat still 89%  - rec trial off acei starting 12/25/2014 > better cough/ voice, no change doe - 02/04/2015  try dulera 100 2bid  - 02/23/2016 p extensive coaching HFA effectiveness =    90% - changed to BREO 100 04/08/2016 due to insurance   - The proper method of use, as well as anticipated side effects, of a metered-dose inhaler are discussed and demonstrated to the patient. Improved effectiveness after extensive coaching during this visit to a level of approximately 90 % from a baseline of 75 % so ok to try BREO and if not happy then advair 115 2 bid   I had an extended discussion with the patient reviewing all relevant studies completed to date and  lasting 15 to 20 minutes of a 25 minute visit on the following ongoing concerns:   Formulary restrictions will be an ongoing challenge for the forseable future and I would be happy to pick an alternative if the pt will first  provide me a list of them but pt  will need to return here for training for any new device that is required eg dpi vs hfa vs respimat.    In meantime we can always provide samples so the patient never runs out of any needed respiratory medications.   Each maintenance medication was reviewed in detail including most importantly the difference between maintenance and as needed and under what circumstances the prns are to be used.  Please see instructions for details which were reviewed in writing and the patient given a copy.

## 2016-04-10 ENCOUNTER — Encounter: Payer: Self-pay | Admitting: Internal Medicine

## 2016-04-10 NOTE — Assessment & Plan Note (Addendum)
>   3 m  I took an extended  opportunity with this patient to outline the consequences of continued cigarette use  in airway disorders based on all the data we have from the multiple national lung health studies (perfomed over decades at millions of dollars in cost)  indicating that smoking cessation, not choice of inhalers or physicians, is the most important aspect of care and that the cost of that care will be substantially less over time if he stops smoking now.

## 2016-05-12 ENCOUNTER — Telehealth: Payer: Self-pay | Admitting: Internal Medicine

## 2016-05-12 NOTE — Telephone Encounter (Signed)
LMTCB

## 2016-05-12 NOTE — Telephone Encounter (Signed)
Spoke with pt, states that Breo inhaler is not working, would like us to initiate process of PA for Symbicort.   MW please advise.  Thanks!   Plan A = Automatic = BREO 100 take one click and 2 drags every am then do your dental care - if you like it, fill it, otherwise let us know and we'll try to get you approved for symbicort or dulera   Plan B = Backup Only use your albuterol as a rescue medication to be used if you can't catch your breath by resting or doing a relaxed purse lip breathing pattern.   - The less you use it, the better it will work when you need it. - Ok to use the inhaler up to 2 puffs  every 4 hours if you must but call for appointment if use goes up over your usual need - Don't leave home without it !!  (think of it like the spare tire for your car)   Please schedule a follow up visit in 3 months but call sooner if needed   Patient Instructions       Plan A = Automatic = symbicort or dulera  Take 2 puffs first thing in am and then another 2 puffs about 12 hours later.   Plan B = Backup Only use your albuterol (PROAIR)  as a rescue medication to be used if you can't catch your breath by resting or doing a relaxed purse lip breathing pattern.   - The less you use it, the better it will work when you need it. - Ok to use up to 2 puffs  every 4 hours if you must but call for appointment if use goes up over your usual need - Don't leave home without it !!  (think of it like the spare tire for your car)   The key is to stop smoking completely before smoking completely stops you!    If you are satisfied with your treatment plan,  let your doctor know and he/she can either refill your medications or you can return here when your prescription runs out.     If in any way you are not 100% satisfied,  please tell us.  If 100% better, tell your friends!  Pulmonary follow up is as needed

## 2016-05-12 NOTE — Telephone Encounter (Addendum)
Called and spoke with pt. Informed him of MW's recs. Pt states he would rather have the PA for dulera. He states that the dulera seems to be more helpful then the symbicort. That I would send the message to MW to for approval. He voiced understanding and had no further questions.   Called [facility] and had PA form faxed to [phone]. Greig Castillandrew states that a new rx for [facility] would need to be sent into the pharmacy.   MW please advise if okay to [facility]

## 2016-05-12 NOTE — Telephone Encounter (Signed)
Ok but make sure he has samples until we can get this and if still fails to go thru will need to make appt with fomulary in hand  to pick alternative that is covered

## 2016-05-12 NOTE — Telephone Encounter (Signed)
Pt returning call.Jaime Pierce ° °

## 2016-05-12 NOTE — Telephone Encounter (Signed)
Ok = dulera 100 2bid

## 2016-05-13 MED ORDER — MOMETASONE FURO-FORMOTEROL FUM 100-5 MCG/ACT IN AERO: 2.0000 | INHALATION_SPRAY | Freq: Two times a day (BID) | RESPIRATORY_TRACT | Status: DC

## 2016-05-13 NOTE — Telephone Encounter (Signed)
Rx has been sent in. Will await PA.

## 2016-05-22 ENCOUNTER — Telehealth: Payer: Self-pay | Admitting: *Deleted

## 2016-05-22 NOTE — Telephone Encounter (Signed)
PA for Dulera Key: [id number] Request was sent to plan. Will await approval/denial

## 2016-05-28 ENCOUNTER — Telehealth: Payer: Self-pay | Admitting: Internal Medicine

## 2016-05-28 NOTE — Telephone Encounter (Signed)
lmtcb

## 2016-05-28 NOTE — Telephone Encounter (Signed)
Spoke with pt. He wants his Dulera increased to 200 from 100. Pt was confused as to why MW prescribed Dulera 100 instead of 200.  MW - please advise if we can increase his Dulera.  Thanks.

## 2016-05-28 NOTE — Telephone Encounter (Signed)
There is no evidence the 200 works better than the 100 in copd and does have more topical side effects (sore mouth)but if wants to try the 200 that's fine with me (neither is technically approved for copd anyway so we're basically inventing the rules as we go along to help him pay for his meds)

## 2016-05-29 NOTE — Telephone Encounter (Signed)
Spoke with pt. He is aware of MW's response. States that he spoke with his insurance company and the PA was approved for [facility]ulera 100. Pt will use Dulera 100. Nothing further was needed.

## 2016-05-29 NOTE — Telephone Encounter (Signed)
Check CMM and PA still in process The plan will fax you a determination, typically within 1 to 5 business days

## 2016-06-04 NOTE — Telephone Encounter (Signed)
Checked CMM and PA never went through to the insurance. I have resubmitted the PA and is awaiting response w/ same key #

## 2016-06-16 ENCOUNTER — Encounter: Payer: Self-pay | Admitting: Internal Medicine

## 2016-06-16 ENCOUNTER — Other Ambulatory Visit (INDEPENDENT_AMBULATORY_CARE_PROVIDER_SITE_OTHER): Payer: Medicare Other

## 2016-06-16 ENCOUNTER — Ambulatory Visit (INDEPENDENT_AMBULATORY_CARE_PROVIDER_SITE_OTHER): Payer: Medicare Other | Admitting: Internal Medicine

## 2016-06-16 VITALS — BP 120/80 | HR 59 | Ht 72.0 in | Wt 188.0 lb

## 2016-06-16 DIAGNOSIS — J449 Chronic obstructive pulmonary disease, unspecified: Secondary | ICD-10-CM

## 2016-06-16 DIAGNOSIS — F17210 Nicotine dependence, cigarettes, uncomplicated: Secondary | ICD-10-CM

## 2016-06-16 DIAGNOSIS — Z720 Tobacco use: Secondary | ICD-10-CM

## 2016-06-16 DIAGNOSIS — Z0000 Encounter for general adult medical examination without abnormal findings: Secondary | ICD-10-CM

## 2016-06-16 DIAGNOSIS — Z23 Encounter for immunization: Secondary | ICD-10-CM

## 2016-06-16 DIAGNOSIS — I10 Essential (primary) hypertension: Secondary | ICD-10-CM

## 2016-06-16 DIAGNOSIS — I2583 Coronary atherosclerosis due to lipid rich plaque: Secondary | ICD-10-CM

## 2016-06-16 DIAGNOSIS — I2510 Atherosclerotic heart disease of native coronary artery without angina pectoris: Secondary | ICD-10-CM

## 2016-06-16 LAB — CBC WITH DIFFERENTIAL/PLATELET
BASOS ABS: 0.1 10*3/uL (ref 0.0–0.1)
BASOS PCT: 1.1 % (ref 0.0–3.0)
EOS PCT: 2.3 % (ref 0.0–5.0)
Eosinophils Absolute: 0.2 10*3/uL (ref 0.0–0.7)
HEMATOCRIT: 49.7 % (ref 39.0–52.0)
Hemoglobin: 16.8 g/dL (ref 13.0–17.0)
LYMPHS ABS: 3 10*3/uL (ref 0.7–4.0)
LYMPHS PCT: 32.9 % (ref 12.0–46.0)
MCHC: 33.9 g/dL (ref 30.0–36.0)
MCV: 91.6 fl (ref 78.0–100.0)
MONOS PCT: 12 % (ref 3.0–12.0)
Monocytes Absolute: 1.1 10*3/uL — ABNORMAL HIGH (ref 0.1–1.0)
NEUTROS ABS: 4.6 10*3/uL (ref 1.4–7.7)
NEUTROS PCT: 51.7 % (ref 43.0–77.0)
PLATELETS: 263 10*3/uL (ref 150.0–400.0)
RBC: 5.42 Mil/uL (ref 4.22–5.81)
RDW: 14.6 % (ref 11.5–15.5)
WBC: 9 10*3/uL (ref 4.0–10.5)

## 2016-06-16 LAB — BASIC METABOLIC PANEL
BUN: 16 mg/dL (ref 6–23)
CALCIUM: 9.9 mg/dL (ref 8.4–10.5)
CHLORIDE: 104 meq/L (ref 96–112)
CO2: 28 mEq/L (ref 19–32)
CREATININE: 0.98 mg/dL (ref 0.40–1.50)
GFR: 80.43 mL/min (ref >60.00)
Glucose, Bld: 99 mg/dL (ref 70–99)
Potassium: 5.2 mEq/L — ABNORMAL HIGH (ref 3.5–5.1)
Sodium: 140 mEq/L (ref 135–145)

## 2016-06-16 LAB — URINALYSIS, ROUTINE W REFLEX MICROSCOPIC
BILIRUBIN URINE: NEGATIVE
HGB URINE DIPSTICK: NEGATIVE
Ketones, ur: NEGATIVE
NITRITE: NEGATIVE
RBC / HPF: NONE SEEN (ref 0–?)
Specific Gravity, Urine: <=1.005 — AB (ref 1.000–1.030)
TOTAL PROTEIN, URINE-UPE24: NEGATIVE
URINE GLUCOSE: NEGATIVE
Urobilinogen, UA: 0.2 (ref 0.0–1.0)
pH: 6 (ref 5.0–8.0)

## 2016-06-16 LAB — LIPID PANEL
CHOLESTEROL: 135 mg/dL (ref 0–200)
HDL: 38.2 mg/dL — AB (ref >39.00)
LDL Cholesterol: 78 mg/dL (ref 0–99)
NONHDL: 97.01
Total CHOL/HDL Ratio: 4
Triglycerides: 94 mg/dL (ref 0.0–149.0)
VLDL: 18.8 mg/dL (ref 0.0–40.0)

## 2016-06-16 LAB — HEPATIC FUNCTION PANEL
ALBUMIN: 4.6 g/dL (ref 3.5–5.2)
ALK PHOS: 60 U/L (ref 39–117)
ALT: 21 U/L (ref 0–53)
AST: 15 U/L (ref 0–37)
Bilirubin, Direct: 0.2 mg/dL (ref 0.0–0.3)
TOTAL PROTEIN: 7.2 g/dL (ref 6.0–8.3)
Total Bilirubin: 1.2 mg/dL (ref 0.2–1.2)

## 2016-06-16 LAB — PSA: PSA: 2 ng/mL (ref 0.10–4.00)

## 2016-06-16 LAB — TSH: TSH: 2.13 u[IU]/mL (ref 0.35–4.50)

## 2016-06-16 NOTE — Assessment & Plan Note (Signed)

## 2016-06-16 NOTE — Patient Instructions (Signed)

## 2016-06-16 NOTE — Progress Notes (Signed)
Pre visit review using our clinic review tool, if applicable. No additional management support is needed unless otherwise documented below in the visit note. 

## 2016-06-16 NOTE — Progress Notes (Signed)
Subjective:  Patient ID: Jaime Pierce, male    DOB: 1946-04-03  Age: 70 y.o. MRN: [id number]  CC: No chief complaint on file.   HPI Jaime Pierce presents for a well exam  Outpatient Medications Prior to Visit  Medication Sig Dispense Refill  . albuterol (PROAIR HFA) 108 (90 Base) MCG/ACT inhaler 2 puffs every 4 hours as needed only  if your can't catch your breath 1 Inhaler 1  . aspirin 81 MG tablet Take 81 mg by mouth daily.      [name] Kitchen atorvastatin (LIPITOR) 40 MG tablet Take 1 tablet (40 mg total) by mouth daily. 90 tablet 3  . cetirizine (ZYRTEC) 10 MG tablet Take 10 mg by mouth daily.    . cholecalciferol (VITAMIN D) 1000 units tablet Take 1 tablet (1,000 Units total) by mouth daily. 100 tablet 3  . fluticasone furoate-vilanterol (BREO ELLIPTA) 100-25 MCG/INH AEPB Inhale 1 puff into the lungs every morning. 28 each 11  . metoprolol (LOPRESSOR) 50 MG tablet Take 0.5 tablets (25 mg total) by mouth 2 (two) times daily. 90 tablet 3  . mometasone-formoterol (DULERA) 100-5 MCG/ACT AERO Inhale 2 puffs into the lungs 2 (two) times daily. 1 Inhaler 5  . valsartan (DIOVAN) 160 MG tablet Take 1 tablet (160 mg total) by mouth daily. 90 tablet 3  . nitroGLYCERIN (NITROSTAT) 0.4 MG SL tablet Place 1 tablet (0.4 mg total) under the tongue every 5 (five) minutes as needed for chest pain. 100 tablet 3   No facility-administered medications prior to visit.     ROS Review of Systems  Constitutional: Negative for appetite change, fatigue and unexpected weight change.  HENT: Negative for congestion, nosebleeds, sneezing, sore throat and trouble swallowing.   Eyes: Negative for itching and visual disturbance.  Respiratory: Negative for cough.   Cardiovascular: Negative for chest pain, palpitations and leg swelling.  Gastrointestinal: Negative for abdominal distention, blood in stool, diarrhea and nausea.  Genitourinary: Negative for frequency and hematuria.  Musculoskeletal: Negative for  back pain, gait problem, joint swelling and neck pain.  Skin: Negative for rash.  Neurological: Negative for dizziness, tremors, speech difficulty and weakness.  Psychiatric/Behavioral: Negative for agitation, dysphoric mood, sleep disturbance and suicidal ideas. The patient is not nervous/anxious.     Objective:  BP 120/80   Pulse (!) 59   Ht 6' (1.829 m)   Wt 188 lb (85.3 kg)   SpO2 94%   BMI 25.50 kg/m   BP Readings from Last 3 Encounters:  06/16/16 120/80  04/08/16 90/60  03/17/16 134/88    Wt Readings from Last 3 Encounters:  06/16/16 188 lb (85.3 kg)  04/08/16 189 lb 9.6 oz (86 kg)  03/17/16 192 lb (87.1 kg)    Physical Exam  Constitutional: He is oriented to person, place, and time. He appears well-developed. No distress.  NAD  HENT:  Mouth/Throat: Oropharynx is clear and moist.  Eyes: Conjunctivae are normal. Pupils are equal, round, and reactive to light.  Neck: Normal range of motion. No JVD present. No thyromegaly present.  Cardiovascular: Normal rate, regular rhythm, normal heart sounds and intact distal pulses.  Exam reveals no gallop and no friction rub.   No murmur heard. Pulmonary/Chest: Effort normal and breath sounds normal. No respiratory distress. He has no wheezes. He has no rales. He exhibits no tenderness.  Abdominal: Soft. Bowel sounds are normal. He exhibits no distension and no mass. There is no tenderness. There is no rebound and no guarding.  Genitourinary: Rectum  normal and prostate normal. Rectal exam shows guaiac negative stool.  Musculoskeletal: Normal range of motion. He exhibits no edema or tenderness.  Lymphadenopathy:    He has no cervical adenopathy.  Neurological: He is alert and oriented to person, place, and time. He has normal reflexes. No cranial nerve deficit. He exhibits normal muscle tone. He displays a negative Romberg sign. Coordination and gait normal.  Skin: Skin is warm and dry. No rash noted.  Psychiatric: He has a normal  mood and affect. His behavior is normal. Judgment and thought content normal.  prostate 1+  Lab Results  Component Value Date   GLUCOSE 107 (H) 11/25/2012   CHOL 109 11/25/2012   TRIG 62.0 11/25/2012   HDL 28.90 (L) 11/25/2012   LDLCALC 68 11/25/2012   ALT 20 11/25/2012   AST 13 11/25/2012   NA 139 11/25/2012   K 4.1 11/25/2012   CL 106 11/25/2012   CREATININE 0.9 11/25/2012   BUN 19 11/25/2012   CO2 26 11/25/2012   PSA 1.50 11/25/2012    Dg Chest 2 View  Result Date: 11/27/2014 CLINICAL DATA:  History of COPD and coronary artery disease and continued tobacco use EXAM: CHEST  2 VIEW COMPARISON:  PA and lateral chest of October 19, 2012 FINDINGS: The lungs remain hyperinflated with hemidiaphragm flattening. The pulmonary interstitial markings remain increased bilaterally. The cardiac silhouette and pulmonary vascularity are unremarkable. The patient has undergone previous CABG. There are 7 intact sternal wires present. There is mild tortuosity of the descending thoracic aorta. There is mild degenerative disc space narrowing in the mid and lower thoracic spine. IMPRESSION: COPD.  There is no active cardiopulmonary disease. Electronically Signed   By: David  [place]   On: 11/27/2014 14:50    Assessment & Plan:   There are no diagnoses linked to this encounter. I am having Jaime Pierce maintain his aspirin, nitroGLYCERIN, cetirizine, albuterol, atorvastatin, metoprolol, valsartan, cholecalciferol, fluticasone furoate-vilanterol, and mometasone-formoterol.  No orders of the defined types were placed in this encounter.    Follow-up: No Follow-up on file.  Sonda Primes, MD

## 2016-06-17 LAB — HEPATITIS C ANTIBODY: HCV AB: NEGATIVE

## 2016-07-21 ENCOUNTER — Ambulatory Visit: Payer: Medicare Other | Admitting: Internal Medicine

## 2016-08-11 ENCOUNTER — Ambulatory Visit: Payer: Medicare Other | Admitting: Internal Medicine

## 2016-12-17 ENCOUNTER — Ambulatory Visit: Payer: Medicare Other | Admitting: Internal Medicine

## 2017-03-01 DIAGNOSIS — L820 Inflamed seborrheic keratosis: Secondary | ICD-10-CM | POA: Diagnosis not present

## 2017-03-23 DIAGNOSIS — R69 Illness, unspecified: Secondary | ICD-10-CM | POA: Diagnosis not present

## 2017-03-25 ENCOUNTER — Encounter: Payer: Self-pay | Admitting: Internal Medicine

## 2017-03-25 ENCOUNTER — Ambulatory Visit (INDEPENDENT_AMBULATORY_CARE_PROVIDER_SITE_OTHER): Payer: Medicare HMO | Admitting: Internal Medicine

## 2017-03-25 VITALS — BP 116/70 | HR 65 | Ht 72.0 in | Wt 188.8 lb

## 2017-03-25 DIAGNOSIS — J449 Chronic obstructive pulmonary disease, unspecified: Secondary | ICD-10-CM | POA: Diagnosis not present

## 2017-03-25 DIAGNOSIS — R69 Illness, unspecified: Secondary | ICD-10-CM | POA: Diagnosis not present

## 2017-03-25 DIAGNOSIS — F17210 Nicotine dependence, cigarettes, uncomplicated: Secondary | ICD-10-CM | POA: Diagnosis not present

## 2017-03-25 MED ORDER — BUDESONIDE-FORMOTEROL FUMARATE 160-4.5 MCG/ACT IN AERO: INHALATION_SPRAY | RESPIRATORY_TRACT | 11 refills | Status: DC

## 2017-03-25 MED ORDER — ALBUTEROL SULFATE HFA 108 (90 BASE) MCG/ACT IN AERS: INHALATION_SPRAY | RESPIRATORY_TRACT | 11 refills | Status: AC

## 2017-03-25 MED ORDER — BUDESONIDE-FORMOTEROL FUMARATE 160-4.5 MCG/ACT IN AERO: INHALATION_SPRAY | RESPIRATORY_TRACT | 0 refills | Status: AC

## 2017-03-25 NOTE — Patient Instructions (Addendum)
Change dulera to symbicort 160 Take 2 puffs first thing in am and then another 2 puffs about 12 hours later.    Only use your albuterol as a rescue medication to be used if you can't catch your breath by resting or doing a relaxed purse lip breathing pattern.  - The less you use it, the better it will work when you need it. - Ok to use up to 2 puffs  every 4 hours if you must but call for immediate appointment if use goes up over your usual need - Don't leave home without it !!  (think of it like the spare tire for your car)    The key is to stop smoking completely before smoking completely stops you!   Please schedule a follow up visit in 6  months but call sooner if needed with PFTs on return

## 2017-03-25 NOTE — Assessment & Plan Note (Signed)
Reviewed matter of life or breath, not committed yet to stop

## 2017-03-25 NOTE — Assessment & Plan Note (Addendum)
-   PFTs FEV1 12/24/14 2.17 (60%) ratio 52 s reversibility  and DLCO 36 and 37% corrects for alv vol  - 12/24/2014  Walked RA x 3 laps @ 185 ft each stopped due to  End of study, moderate pace, no  sob even at end with sat still 89%  - rec trial off acei starting 12/25/2014 > better cough/ voice, no change doe - 02/04/2015  try dulera 100 2bid  - 02/23/2016 p extensive coaching HFA effectiveness =    90% - changed to BREO 100 04/08/2016 due to insurance  - changed to Symbicort 160 due to insurance 03/25/2017 >>>   Despite smoking his doe/ cough have improved on laba/ics s tendency to aecopd so no need to change rx at this point unless considering laba/lama option. Discussed in detail all the  indications, usual  risks and alternatives  relative to the benefits with patient who wants to continue with the equivaltent of dulera since he's done so well on it  I did rec the 160 dose since the 80 is not approved for copd but if irritates mouth ok to go with the unapproved (80) strength  I had an extended discussion with the patient reviewing all relevant studies completed to date and  lasting 15 to 20 minutes of a 25 minute visit    Formulary restrictions will be an ongoing challenge for the forseable future and I would be happy to pick an alternative if the pt will first  provide me a list of them but pt  will need to return here for training for any new device that is required eg dpi vs hfa vs respimat.    In meantime we can always provide samples so the patient never runs out of any needed respiratory medications.   Each maintenance medication was reviewed in detail including most importantly the difference between maintenance and prns and under what circumstances the prns are to be triggered using an action plan format that is not reflected in the computer generated alphabetically organized AVS.    Please see AVS for specific instructions unique to this visit that I personally wrote and verbalized to the the pt  in detail and then reviewed with pt  by my nurse highlighting any  changes in therapy recommended at today's visit to their plan of care.

## 2017-03-25 NOTE — Progress Notes (Signed)
Subjective:    Patient ID: Jaime Pierce, male    DOB: May 13, 1946,    MRN: [id number]    Brief patient profile:  [age] yowm active Pierce with doe x 2015 just with exertion  assoc dry cough referred by Jaime Pierce to pulmonary clinic 12/24/14 with GOLD II copd criteria 2016    History of Present Illness  12/24/2014 1st [facility] office visit/ Jaime Pierce  Chief Complaint  Patient presents with  . Advice Only    Referred by Jaime. Eden Pierce for increasing SOB X1 year.    indolent onset doe x one year somewhat variable with coughing never tried inhalers Baseline=  Sob when Get in a hurry, steps but other days when coughing more gets more sob to point where occurs x across the room Cough is dry/ sporadic/ daytime assoc with mild hoarseness rec Stop altace and replace with Diovan 160 mg daily  The key is to stop smoking completely before smoking completely stops you - it's not too late in your case          04/08/2016  f/u ov/Jaime Pierce re: copd II/ still smoking and concerned about the cost of his meds = symb 160 2bid but insurance requiring trial of breo/advair first  Chief Complaint  Patient presents with  . Follow-up    Pt here to discuss changing meds- ins does not prefer symbicort- has to try breo or advair   [facility] can't walk a nl pace on a flat grade s sob but does fine slow and flat  rec Plan A = Automatic = BREO 100 take one click and 2 drags every am then do your dental care - if you like it, fill it, otherwise let us know and we'll try to get you approved for symbicort or dulera  Plan B = Backup Only use your albuterol as a rescue medication    03/25/2017  f/u ov/Jaime Pierce re:   Copd II / still smoking  symb Chief Complaint  Patient presents with  . Follow-up    Breathing is overall doing well. He uses his albuterol inhaler 2 x per wk on average.   doe still MMRC2 / only uses saba if over does  Did not tolerate BREO due to mouth irritation , does fine on dulera  100 but insurance wants changed to symbicort  No obvious day to day or daytime variability or assoc excess/ purulent sputum or mucus plugs or hemoptysis or cp or chest tightness, subjective wheeze or overt sinus or hb symptoms. No unusual exp hx or h/o childhood pna/ asthma or knowledge of premature birth.  Sleeping ok without nocturnal  or early am exacerbation  of respiratory  c/o's or need for noct saba. Also denies any obvious fluctuation of symptoms with weather or environmental changes or other aggravating or alleviating factors except as outlined above   Current Medications, Allergies, Complete Past Medical History, Past Surgical History, Family History, and Social History were reviewed in [facility] electronic medical record.  ROS  The following are not active complaints unless bolded sore throat, dysphagia, dental problems, itching, sneezing,  nasal congestion or excess/ purulent secretions, ear ache,   fever, chills, sweats, unintended wt loss, classically pleuritic or exertional cp,  orthopnea pnd or leg swelling, presyncope, palpitations, abdominal pain, anorexia, nausea, vomiting, diarrhea  or change in bowel or bladder habits, change in stools or urine, dysuria,hematuria,  rash, arthralgias, visual complaints, headache, numbness, weakness or ataxia or problems with  walking or coordination,  change in mood/affect or memory.                             Objective:   Physical Exam   Stoic amb wm nad  02/04/2015       189  >  03/18/2015  192 > 02/21/2016  192 > 04/08/2016  190 > 03/25/2017  189     12/24/14 191 lb (86.637 kg)  12/05/14 185 lb (83.915 kg)  11/27/14 187 lb (84.823 kg)    Vital signs reviewed  - Note on arrival 02 sats  96% on  RA    HEENT: nl dentition, turbinates, and orophanx. Nl external ear canals without cough reflex   NECK :  without JVD/Nodes/TM/ nl carotid upstrokes bilaterally   LUNGS: no acc muscle use, slt decreased bs with end exp wheeze  bilaterally    CV:  RRR  no s3 or murmur or increase in P2, no edema   ABD:  soft and nontender with nl excursion in the supine position. No bruits or organomegaly, bowel sounds nl  MS:  warm without deformities, calf tenderness, cyanosis or clubbing  SKIN: warm and dry without lesions    NEURO:  alert, approp, no deficits          Assessment & Plan:

## 2017-04-22 ENCOUNTER — Other Ambulatory Visit: Payer: Self-pay | Admitting: *Deleted

## 2017-04-22 DIAGNOSIS — I10 Essential (primary) hypertension: Secondary | ICD-10-CM

## 2017-04-22 MED ORDER — VALSARTAN 160 MG PO TABS: 160.0000 mg | ORAL_TABLET | Freq: Every day | ORAL | 0 refills | Status: DC

## 2017-04-22 NOTE — Telephone Encounter (Signed)
Rec'd call pt requesting refill on his Valsartan. Verified chart inform pt he is due for annual appt in July can send enough medication until appt. Sent rx to [facility].../lmb

## 2017-06-21 ENCOUNTER — Encounter: Payer: Medicare HMO | Admitting: Internal Medicine

## 2017-06-30 ENCOUNTER — Encounter: Payer: Self-pay | Admitting: Internal Medicine

## 2017-06-30 ENCOUNTER — Ambulatory Visit (INDEPENDENT_AMBULATORY_CARE_PROVIDER_SITE_OTHER): Payer: Medicare HMO | Admitting: Internal Medicine

## 2017-06-30 VITALS — BP 110/62 | HR 69 | Temp 97.7°F | Ht 72.0 in | Wt 183.0 lb

## 2017-06-30 DIAGNOSIS — E785 Hyperlipidemia, unspecified: Secondary | ICD-10-CM

## 2017-06-30 DIAGNOSIS — Z0000 Encounter for general adult medical examination without abnormal findings: Secondary | ICD-10-CM

## 2017-06-30 DIAGNOSIS — I10 Essential (primary) hypertension: Secondary | ICD-10-CM

## 2017-06-30 DIAGNOSIS — I2583 Coronary atherosclerosis due to lipid rich plaque: Secondary | ICD-10-CM

## 2017-06-30 DIAGNOSIS — I2510 Atherosclerotic heart disease of native coronary artery without angina pectoris: Secondary | ICD-10-CM

## 2017-06-30 MED ORDER — IRBESARTAN 150 MG PO TABS: 150.0000 mg | ORAL_TABLET | Freq: Every day | ORAL | 3 refills | Status: DC

## 2017-06-30 MED ORDER — NITROGLYCERIN 0.4 MG SL SUBL: 0.4000 mg | SUBLINGUAL_TABLET | SUBLINGUAL | 3 refills | Status: AC | PRN

## 2017-06-30 MED ORDER — ZOSTER VAC RECOMB ADJUVANTED 50 MCG/0.5ML IM SUSR: 0.5000 mL | Freq: Once | INTRAMUSCULAR | 1 refills | Status: AC

## 2017-06-30 NOTE — Assessment & Plan Note (Signed)
ASA, Lipitor 

## 2017-06-30 NOTE — Assessment & Plan Note (Addendum)
Here for medicare wellness/physical  Diet: heart healthy  Physical activity: not sedentary  Depression/mood screen: negative  Hearing: intact to whispered voice  Visual acuity: grossly normal, performs annual eye exam  ADLs: capable  Fall risk: low to none  Home safety: good  Cognitive evaluation: intact to orientation, naming, recall and repetition  EOL planning: adv directives, full code/ I agree  I have personally reviewed and have noted  1. The patient's medical, surgical and social history  2. Their use of alcohol, tobacco or illicit drugs  3. Their current medications and supplements  4. The patient's functional ability including ADL's, fall risks, home safety risks and hearing or visual impairment.  5. Diet and physical activities  6. Evidence for depression or mood disorders 7. The roster of all physicians providing medical care to patient - is listed in the Snapshot section of the chart and reviewed today.    Today patient counseled on age appropriate routine health concerns for screening and prevention, each reviewed and up to date or declined. Immunizations reviewed and up to date or declined. Labs ordered and reviewed. Risk factors for depression reviewed and negative. Hearing function and visual acuity are intact. ADLs screened and addressed as needed. Functional ability and level of safety reviewed and appropriate. Education, counseling and referrals performed based on assessed risks today. Patient provided with a copy of personalized plan for preventive services.   Shingrix Cologuard Pt declined rectal and colonoscopy

## 2017-06-30 NOTE — Assessment & Plan Note (Signed)
Lipitor Labs 

## 2017-06-30 NOTE — Assessment & Plan Note (Signed)
D/c Diovan Start Irbesartan

## 2017-06-30 NOTE — Patient Instructions (Signed)

## 2017-06-30 NOTE — Progress Notes (Signed)
Subjective:  Patient ID: Jaime Pierce, male    DOB: 05/10/1946  Age: 71 y.o. MRN: [id number]  CC: No chief complaint on file.   HPI Jaime Fuelichard A Fazekas presents for a well exam  Outpatient Medications Prior to Visit  Medication Sig Dispense Refill  . albuterol (PROAIR HFA) 108 (90 Base) MCG/ACT inhaler 2 puffs up to every 4 hours if can't catch your breath 1 Inhaler 11  . aspirin 81 MG tablet Take 81 mg by mouth daily.      [name] Kitchen. atorvastatin (LIPITOR) 40 MG tablet Take 1 tablet (40 mg total) by mouth daily. 90 tablet 3  . budesonide-formoterol (SYMBICORT) 160-4.5 MCG/ACT inhaler Take 2 puffs first thing in am and then another 2 puffs about 12 hours later. 1 Inhaler 0  . cetirizine (ZYRTEC) 10 MG tablet Take 10 mg by mouth daily.    . metoprolol (LOPRESSOR) 50 MG tablet Take 0.5 tablets (25 mg total) by mouth 2 (two) times daily. 90 tablet 3  . valsartan (DIOVAN) 160 MG tablet Take 1 tablet (160 mg total) by mouth daily. Annual appt due in July must see MD for future refills 90 tablet 0  . nitroGLYCERIN (NITROSTAT) 0.4 MG SL tablet Place 1 tablet (0.4 mg total) under the tongue every 5 (five) minutes as needed for chest pain. 100 tablet 3  . albuterol (PROAIR HFA) 108 (90 Base) MCG/ACT inhaler 2 puffs every 4 hours as needed only  if your can't catch your breath 1 Inhaler 1   No facility-administered medications prior to visit.     ROS Review of Systems  Constitutional: Negative for appetite change, fatigue and unexpected weight change.  HENT: Negative for congestion, nosebleeds, sneezing, sore throat and trouble swallowing.   Eyes: Negative for itching and visual disturbance.  Respiratory: Negative for cough.   Cardiovascular: Negative for chest pain, palpitations and leg swelling.  Gastrointestinal: Negative for abdominal distention, blood in stool, diarrhea and nausea.  Genitourinary: Negative for frequency and hematuria.  Musculoskeletal: Negative for back pain, gait problem,  joint swelling and neck pain.  Skin: Negative for rash.  Neurological: Negative for dizziness, tremors, speech difficulty and weakness.  Psychiatric/Behavioral: Negative for agitation, dysphoric mood and sleep disturbance. The patient is not nervous/anxious.     Objective:  BP 110/62 (BP Location: Left Arm, Patient Position: Sitting, Cuff Size: Large)   Pulse 69   Temp 97.7 F (36.5 C) (Oral)   Ht 6' (1.829 m)   Wt 183 lb (83 kg)   SpO2 97%   BMI 24.82 kg/m   BP Readings from Last 3 Encounters:  06/30/17 110/62  03/25/17 116/70  06/16/16 120/80    Wt Readings from Last 3 Encounters:  06/30/17 183 lb (83 kg)  03/25/17 188 lb 12.8 oz (85.6 kg)  06/16/16 188 lb (85.3 kg)    Physical Exam  Constitutional: He is oriented to person, place, and time. He appears well-developed and well-nourished. No distress.  HENT:  Head: Normocephalic and atraumatic.  Right Ear: External ear normal.  Left Ear: External ear normal.  Nose: Nose normal.  Mouth/Throat: Oropharynx is clear and moist. No oropharyngeal exudate.  Eyes: Pupils are equal, round, and reactive to light. Conjunctivae and EOM are normal. Right eye exhibits no discharge. Left eye exhibits no discharge. No scleral icterus.  Neck: Normal range of motion. Neck supple. No JVD present. No tracheal deviation present. No thyromegaly present.  Cardiovascular: Normal rate, regular rhythm, normal heart sounds and intact distal pulses.  Exam  reveals no gallop and no friction rub.   No murmur heard. Pulmonary/Chest: Effort normal and breath sounds normal. No stridor. No respiratory distress. He has no wheezes. He has no rales. He exhibits no tenderness.  Abdominal: Soft. Bowel sounds are normal. He exhibits no distension and no mass. There is no tenderness. There is no rebound and no guarding.  Genitourinary: Rectum normal, prostate normal and penis normal. Rectal exam shows guaiac negative stool. No penile tenderness.  Musculoskeletal:  Normal range of motion. He exhibits no edema or tenderness.  Lymphadenopathy:    He has no cervical adenopathy.  Neurological: He is alert and oriented to person, place, and time. He has normal reflexes. No cranial nerve deficit. He exhibits normal muscle tone. Coordination normal.  Skin: Skin is warm and dry. No rash noted. He is not diaphoretic. No erythema. No pallor.  Psychiatric: He has a normal mood and affect. His behavior is normal. Judgment and thought content normal.  pt refused rectal  Lab Results  Component Value Date   WBC 9.0 06/16/2016   HGB 16.8 06/16/2016   HCT 49.7 06/16/2016   PLT 263.0 06/16/2016   GLUCOSE 99 06/16/2016   CHOL 135 06/16/2016   TRIG 94.0 06/16/2016   HDL 38.20 (L) 06/16/2016   LDLCALC 78 06/16/2016   ALT 21 06/16/2016   AST 15 06/16/2016   NA 140 06/16/2016   K 5.2 (H) 06/16/2016   CL 104 06/16/2016   CREATININE 0.98 06/16/2016   BUN 16 06/16/2016   CO2 28 06/16/2016   TSH 2.13 06/16/2016   PSA 2.00 06/16/2016    Dg Chest 2 View  Result Date: 11/27/2014 CLINICAL DATA:  History of COPD and coronary artery disease and continued tobacco use EXAM: CHEST  2 VIEW COMPARISON:  PA and lateral chest of October 19, 2012 FINDINGS: The lungs remain hyperinflated with hemidiaphragm flattening. The pulmonary interstitial markings remain increased bilaterally. The cardiac silhouette and pulmonary vascularity are unremarkable. The patient has undergone previous CABG. There are 7 intact sternal wires present. There is mild tortuosity of the descending thoracic aorta. There is mild degenerative disc space narrowing in the mid and lower thoracic spine. IMPRESSION: COPD.  There is no active cardiopulmonary disease. Electronically Signed   By: David  [place]   On: 11/27/2014 14:50    Assessment & Plan:   There are no diagnoses linked to this encounter. I am having Mr. Rosner maintain his aspirin, nitroGLYCERIN, cetirizine, atorvastatin, metoprolol tartrate,  albuterol, budesonide-formoterol, and valsartan.  No orders of the defined types were placed in this encounter.    Follow-up: No Follow-up on file.  Sonda Primes, MD

## 2017-07-01 ENCOUNTER — Other Ambulatory Visit (INDEPENDENT_AMBULATORY_CARE_PROVIDER_SITE_OTHER): Payer: Medicare HMO

## 2017-07-01 DIAGNOSIS — E785 Hyperlipidemia, unspecified: Secondary | ICD-10-CM | POA: Diagnosis not present

## 2017-07-01 DIAGNOSIS — Z0000 Encounter for general adult medical examination without abnormal findings: Secondary | ICD-10-CM

## 2017-07-01 LAB — CBC WITH DIFFERENTIAL/PLATELET
BASOS ABS: 0 10*3/uL (ref 0.0–0.1)
BASOS PCT: 0.2 % (ref 0.0–3.0)
EOS ABS: 0.2 10*3/uL (ref 0.0–0.7)
Eosinophils Relative: 2.4 % (ref 0.0–5.0)
HEMATOCRIT: 49 % (ref 39.0–52.0)
Hemoglobin: 16.7 g/dL (ref 13.0–17.0)
LYMPHS PCT: 30.1 % (ref 12.0–46.0)
Lymphs Abs: 2 10*3/uL (ref 0.7–4.0)
MCHC: 34 g/dL (ref 30.0–36.0)
MCV: 94 fl (ref 78.0–100.0)
MONO ABS: 1 10*3/uL (ref 0.1–1.0)
Monocytes Relative: 14.7 % — ABNORMAL HIGH (ref 3.0–12.0)
Neutro Abs: 3.5 10*3/uL (ref 1.4–7.7)
Neutrophils Relative %: 52.6 % (ref 43.0–77.0)
Platelets: 208 10*3/uL (ref 150.0–400.0)
RBC: 5.21 Mil/uL (ref 4.22–5.81)
RDW: 14.3 % (ref 11.5–15.5)
WBC: 6.7 10*3/uL (ref 4.0–10.5)

## 2017-07-01 LAB — LIPID PANEL
CHOL/HDL RATIO: 4
Cholesterol: 129 mg/dL (ref 0–200)
HDL: 34.2 mg/dL — ABNORMAL LOW (ref >39.00)
LDL CALC: 78 mg/dL (ref 0–99)
NONHDL: 94.88
TRIGLYCERIDES: 83 mg/dL (ref 0.0–149.0)
VLDL: 16.6 mg/dL (ref 0.0–40.0)

## 2017-07-01 LAB — URINALYSIS, ROUTINE W REFLEX MICROSCOPIC
BILIRUBIN URINE: NEGATIVE
HGB URINE DIPSTICK: NEGATIVE
KETONES UR: NEGATIVE
NITRITE: NEGATIVE
Specific Gravity, Urine: 1.015 (ref 1.000–1.030)
Total Protein, Urine: NEGATIVE
Urine Glucose: NEGATIVE
Urobilinogen, UA: 0.2 (ref 0.0–1.0)
pH: 6.5 (ref 5.0–8.0)

## 2017-07-01 LAB — HEPATIC FUNCTION PANEL
ALK PHOS: 59 U/L (ref 39–117)
ALT: 13 U/L (ref 0–53)
AST: 13 U/L (ref 0–37)
Albumin: 4.2 g/dL (ref 3.5–5.2)
BILIRUBIN DIRECT: 0.2 mg/dL (ref 0.0–0.3)
BILIRUBIN TOTAL: 1.4 mg/dL — AB (ref 0.2–1.2)
TOTAL PROTEIN: 6.6 g/dL (ref 6.0–8.3)

## 2017-07-01 LAB — BASIC METABOLIC PANEL
BUN: 14 mg/dL (ref 6–23)
CALCIUM: 9 mg/dL (ref 8.4–10.5)
CHLORIDE: 104 meq/L (ref 96–112)
CO2: 26 mEq/L (ref 19–32)
CREATININE: 0.88 mg/dL (ref 0.40–1.50)
GFR: 90.79 mL/min (ref >60.00)
Glucose, Bld: 106 mg/dL — ABNORMAL HIGH (ref 70–99)
Potassium: 4.5 mEq/L (ref 3.5–5.1)
Sodium: 138 mEq/L (ref 135–145)

## 2017-07-01 LAB — PSA: PSA: 1.25 ng/mL (ref 0.10–4.00)

## 2017-07-01 LAB — TSH: TSH: 2.52 u[IU]/mL (ref 0.35–4.50)

## 2017-07-23 ENCOUNTER — Other Ambulatory Visit: Payer: Self-pay | Admitting: Internal Medicine

## 2017-07-23 DIAGNOSIS — I10 Essential (primary) hypertension: Secondary | ICD-10-CM

## 2017-08-24 DIAGNOSIS — Z1211 Encounter for screening for malignant neoplasm of colon: Secondary | ICD-10-CM | POA: Diagnosis not present

## 2017-08-24 DIAGNOSIS — Z1212 Encounter for screening for malignant neoplasm of rectum: Secondary | ICD-10-CM | POA: Diagnosis not present

## 2017-08-31 LAB — COLOGUARD: Cologuard: POSITIVE

## 2017-09-01 ENCOUNTER — Inpatient Hospital Stay (HOSPITAL_COMMUNITY): Payer: Medicare HMO

## 2017-09-01 ENCOUNTER — Emergency Department (HOSPITAL_COMMUNITY): Payer: Medicare HMO

## 2017-09-01 ENCOUNTER — Inpatient Hospital Stay (HOSPITAL_COMMUNITY)
Admission: EM | Admit: 2017-09-01 | Discharge: 2017-09-09 | DRG: 064 | Disposition: A | Discharge status: Rehabilitation Facility | Payer: Medicare HMO | Attending: Neurology | Admitting: Neurology

## 2017-09-01 ENCOUNTER — Encounter (HOSPITAL_COMMUNITY): Payer: Self-pay | Admitting: Emergency Medicine

## 2017-09-01 ENCOUNTER — Other Ambulatory Visit: Payer: Self-pay

## 2017-09-01 DIAGNOSIS — G936 Cerebral edema: Secondary | ICD-10-CM | POA: Diagnosis not present

## 2017-09-01 DIAGNOSIS — Z8249 Family history of ischemic heart disease and other diseases of the circulatory system: Secondary | ICD-10-CM | POA: Diagnosis not present

## 2017-09-01 DIAGNOSIS — B369 Superficial mycosis, unspecified: Secondary | ICD-10-CM | POA: Diagnosis not present

## 2017-09-01 DIAGNOSIS — I69151 Hemiplegia and hemiparesis following nontraumatic intracerebral hemorrhage affecting right dominant side: Secondary | ICD-10-CM | POA: Diagnosis not present

## 2017-09-01 DIAGNOSIS — I739 Peripheral vascular disease, unspecified: Secondary | ICD-10-CM | POA: Diagnosis present

## 2017-09-01 DIAGNOSIS — I629 Nontraumatic intracranial hemorrhage, unspecified: Secondary | ICD-10-CM

## 2017-09-01 DIAGNOSIS — G8191 Hemiplegia, unspecified affecting right dominant side: Secondary | ICD-10-CM | POA: Diagnosis not present

## 2017-09-01 DIAGNOSIS — R402252 Coma scale, best verbal response, oriented, at arrival to emergency department: Secondary | ICD-10-CM | POA: Diagnosis present

## 2017-09-01 DIAGNOSIS — R29810 Facial weakness: Secondary | ICD-10-CM | POA: Diagnosis not present

## 2017-09-01 DIAGNOSIS — I2510 Atherosclerotic heart disease of native coronary artery without angina pectoris: Secondary | ICD-10-CM | POA: Diagnosis present

## 2017-09-01 DIAGNOSIS — I10 Essential (primary) hypertension: Secondary | ICD-10-CM | POA: Diagnosis not present

## 2017-09-01 DIAGNOSIS — I6789 Other cerebrovascular disease: Secondary | ICD-10-CM | POA: Diagnosis not present

## 2017-09-01 DIAGNOSIS — I612 Nontraumatic intracerebral hemorrhage in hemisphere, unspecified: Secondary | ICD-10-CM | POA: Diagnosis not present

## 2017-09-01 DIAGNOSIS — R269 Unspecified abnormalities of gait and mobility: Secondary | ICD-10-CM | POA: Diagnosis not present

## 2017-09-01 DIAGNOSIS — F17210 Nicotine dependence, cigarettes, uncomplicated: Secondary | ICD-10-CM | POA: Diagnosis present

## 2017-09-01 DIAGNOSIS — R69 Illness, unspecified: Secondary | ICD-10-CM | POA: Diagnosis not present

## 2017-09-01 DIAGNOSIS — I69398 Other sequelae of cerebral infarction: Secondary | ICD-10-CM | POA: Diagnosis not present

## 2017-09-01 DIAGNOSIS — J449 Chronic obstructive pulmonary disease, unspecified: Secondary | ICD-10-CM | POA: Diagnosis present

## 2017-09-01 DIAGNOSIS — R402142 Coma scale, eyes open, spontaneous, at arrival to emergency department: Secondary | ICD-10-CM | POA: Diagnosis present

## 2017-09-01 DIAGNOSIS — F17200 Nicotine dependence, unspecified, uncomplicated: Secondary | ICD-10-CM | POA: Diagnosis present

## 2017-09-01 DIAGNOSIS — I609 Nontraumatic subarachnoid hemorrhage, unspecified: Secondary | ICD-10-CM | POA: Diagnosis present

## 2017-09-01 DIAGNOSIS — I611 Nontraumatic intracerebral hemorrhage in hemisphere, cortical: Principal | ICD-10-CM | POA: Diagnosis present

## 2017-09-01 DIAGNOSIS — Z7982 Long term (current) use of aspirin: Secondary | ICD-10-CM | POA: Diagnosis not present

## 2017-09-01 DIAGNOSIS — R21 Rash and other nonspecific skin eruption: Secondary | ICD-10-CM | POA: Diagnosis not present

## 2017-09-01 DIAGNOSIS — R29818 Other symptoms and signs involving the nervous system: Secondary | ICD-10-CM | POA: Diagnosis not present

## 2017-09-01 DIAGNOSIS — R05 Cough: Secondary | ICD-10-CM | POA: Diagnosis not present

## 2017-09-01 DIAGNOSIS — R402362 Coma scale, best motor response, obeys commands, at arrival to emergency department: Secondary | ICD-10-CM | POA: Diagnosis present

## 2017-09-01 DIAGNOSIS — I69320 Aphasia following cerebral infarction: Secondary | ICD-10-CM | POA: Diagnosis not present

## 2017-09-01 DIAGNOSIS — Z823 Family history of stroke: Secondary | ICD-10-CM | POA: Diagnosis not present

## 2017-09-01 DIAGNOSIS — R52 Pain, unspecified: Secondary | ICD-10-CM | POA: Diagnosis not present

## 2017-09-01 DIAGNOSIS — R531 Weakness: Secondary | ICD-10-CM | POA: Diagnosis present

## 2017-09-01 DIAGNOSIS — E785 Hyperlipidemia, unspecified: Secondary | ICD-10-CM | POA: Diagnosis not present

## 2017-09-01 DIAGNOSIS — R252 Cramp and spasm: Secondary | ICD-10-CM | POA: Diagnosis not present

## 2017-09-01 DIAGNOSIS — Z951 Presence of aortocoronary bypass graft: Secondary | ICD-10-CM

## 2017-09-01 DIAGNOSIS — E538 Deficiency of other specified B group vitamins: Secondary | ICD-10-CM | POA: Diagnosis not present

## 2017-09-01 DIAGNOSIS — I619 Nontraumatic intracerebral hemorrhage, unspecified: Secondary | ICD-10-CM | POA: Diagnosis present

## 2017-09-01 DIAGNOSIS — I69390 Apraxia following cerebral infarction: Secondary | ICD-10-CM | POA: Diagnosis not present

## 2017-09-01 DIAGNOSIS — B372 Candidiasis of skin and nail: Secondary | ICD-10-CM | POA: Diagnosis not present

## 2017-09-01 DIAGNOSIS — I25810 Atherosclerosis of coronary artery bypass graft(s) without angina pectoris: Secondary | ICD-10-CM | POA: Diagnosis not present

## 2017-09-01 DIAGNOSIS — L22 Diaper dermatitis: Secondary | ICD-10-CM | POA: Diagnosis not present

## 2017-09-01 DIAGNOSIS — S066X0A Traumatic subarachnoid hemorrhage without loss of consciousness, initial encounter: Secondary | ICD-10-CM | POA: Diagnosis not present

## 2017-09-01 DIAGNOSIS — E871 Hypo-osmolality and hyponatremia: Secondary | ICD-10-CM | POA: Diagnosis not present

## 2017-09-01 DIAGNOSIS — D72829 Elevated white blood cell count, unspecified: Secondary | ICD-10-CM | POA: Diagnosis present

## 2017-09-01 DIAGNOSIS — M7989 Other specified soft tissue disorders: Secondary | ICD-10-CM | POA: Diagnosis not present

## 2017-09-01 DIAGNOSIS — I610 Nontraumatic intracerebral hemorrhage in hemisphere, subcortical: Secondary | ICD-10-CM | POA: Diagnosis not present

## 2017-09-01 DIAGNOSIS — G8110 Spastic hemiplegia affecting unspecified side: Secondary | ICD-10-CM | POA: Diagnosis not present

## 2017-09-01 LAB — COMPREHENSIVE METABOLIC PANEL
ALT: 22 U/L (ref 17–63)
AST: 17 U/L (ref 15–41)
Albumin: 3.9 g/dL (ref 3.5–5.0)
Alkaline Phosphatase: 55 U/L (ref 38–126)
Anion gap: 10 (ref 5–15)
BILIRUBIN TOTAL: 0.9 mg/dL (ref 0.3–1.2)
BUN: 16 mg/dL (ref 6–20)
CO2: 20 mmol/L — ABNORMAL LOW (ref 22–32)
CREATININE: 1 mg/dL (ref 0.61–1.24)
Calcium: 8.9 mg/dL (ref 8.9–10.3)
Chloride: 108 mmol/L (ref 101–111)
GFR calc non Af Amer: >60 mL/min (ref >60)
Glucose, Bld: 106 mg/dL — ABNORMAL HIGH (ref 65–99)
POTASSIUM: 4.1 mmol/L (ref 3.5–5.1)
Sodium: 138 mmol/L (ref 135–145)
TOTAL PROTEIN: 6.3 g/dL — AB (ref 6.5–8.1)

## 2017-09-01 LAB — DIFFERENTIAL
BASOS ABS: 0.1 10*3/uL (ref 0.0–0.1)
Basophils Relative: 2 %
EOS ABS: 0.3 10*3/uL (ref 0.0–0.7)
Eosinophils Relative: 4 %
LYMPHS ABS: 3.3 10*3/uL (ref 0.7–4.0)
Lymphocytes Relative: 37 %
MONO ABS: 0.8 10*3/uL (ref 0.1–1.0)
MONOS PCT: 9 %
Neutro Abs: 4.2 10*3/uL (ref 1.7–7.7)
Neutrophils Relative %: 48 %

## 2017-09-01 LAB — PROTIME-INR
INR: 1.02
Prothrombin Time: 13.3 seconds (ref 11.4–15.2)

## 2017-09-01 LAB — ECHOCARDIOGRAM COMPLETE
HEIGHTINCHES: 72 in
WEIGHTICAEL: 3001.78 [oz_av]

## 2017-09-01 LAB — I-STAT CHEM 8, ED
BUN: 18 mg/dL (ref 6–20)
CALCIUM ION: 0.98 mmol/L — AB (ref 1.15–1.40)
CREATININE: 0.9 mg/dL (ref 0.61–1.24)
Chloride: 108 mmol/L (ref 101–111)
Glucose, Bld: 103 mg/dL — ABNORMAL HIGH (ref 65–99)
HCT: 46 % (ref 39.0–52.0)
HEMOGLOBIN: 15.6 g/dL (ref 13.0–17.0)
Potassium: 4 mmol/L (ref 3.5–5.1)
Sodium: 140 mmol/L (ref 135–145)
TCO2: 22 mmol/L (ref 22–32)

## 2017-09-01 LAB — CBC
HCT: 46.3 % (ref 39.0–52.0)
HEMOGLOBIN: 15.9 g/dL (ref 13.0–17.0)
MCH: 31.7 pg (ref 26.0–34.0)
MCHC: 34.3 g/dL (ref 30.0–36.0)
MCV: 92.4 fL (ref 78.0–100.0)
Platelets: 199 10*3/uL (ref 150–400)
RBC: 5.01 MIL/uL (ref 4.22–5.81)
RDW: 14.3 % (ref 11.5–15.5)
WBC: 8.8 10*3/uL (ref 4.0–10.5)

## 2017-09-01 LAB — I-STAT TROPONIN, ED: TROPONIN I, POC: 0 ng/mL (ref 0.00–0.08)

## 2017-09-01 LAB — APTT: APTT: 32 s (ref 24–36)

## 2017-09-01 LAB — ETHANOL: Alcohol, Ethyl (B): <10 mg/dL (ref <10)

## 2017-09-01 LAB — GLUCOSE, CAPILLARY: GLUCOSE-CAPILLARY: 98 mg/dL (ref 65–99)

## 2017-09-01 LAB — MRSA PCR SCREENING: MRSA BY PCR: NEGATIVE

## 2017-09-01 MED ORDER — IOPAMIDOL (ISOVUE-370) INJECTION 76%
INTRAVENOUS | Status: AC
  Administered 2017-09-01: 50 mL
  Filled 2017-09-01: qty 50

## 2017-09-01 MED ORDER — PANTOPRAZOLE SODIUM 40 MG PO TBEC
40.0000 mg | DELAYED_RELEASE_TABLET | Freq: Every day | ORAL | Status: DC
  Administered 2017-09-01 – 2017-09-08 (×8): 40 mg via ORAL
  Filled 2017-09-01 (×8): qty 1

## 2017-09-01 MED ORDER — LABETALOL HCL 5 MG/ML IV SOLN: 20.0000 mg | Freq: Once | INTRAVENOUS | Status: DC

## 2017-09-01 MED ORDER — ACETAMINOPHEN 160 MG/5ML PO SOLN: 650.0000 mg | ORAL | Status: DC | PRN

## 2017-09-01 MED ORDER — PANTOPRAZOLE SODIUM 40 MG IV SOLR: 40.0000 mg | Freq: Every day | INTRAVENOUS | Status: DC

## 2017-09-01 MED ORDER — IRBESARTAN 150 MG PO TABS
150.0000 mg | ORAL_TABLET | Freq: Every day | ORAL | Status: DC
  Administered 2017-09-01 – 2017-09-02 (×2): 150 mg via ORAL
  Filled 2017-09-01 (×2): qty 1

## 2017-09-01 MED ORDER — ALBUTEROL SULFATE (2.5 MG/3ML) 0.083% IN NEBU: 3.0000 mL | INHALATION_SOLUTION | RESPIRATORY_TRACT | Status: DC | PRN

## 2017-09-01 MED ORDER — SENNOSIDES-DOCUSATE SODIUM 8.6-50 MG PO TABS
1.0000 | ORAL_TABLET | Freq: Two times a day (BID) | ORAL | Status: DC
  Administered 2017-09-03 – 2017-09-09 (×12): 1 via ORAL
  Filled 2017-09-01 (×15): qty 1

## 2017-09-01 MED ORDER — CLEVIDIPINE BUTYRATE 0.5 MG/ML IV EMUL: 0.0000 mg/h | INTRAVENOUS | Status: DC

## 2017-09-01 MED ORDER — MOMETASONE FURO-FORMOTEROL FUM 200-5 MCG/ACT IN AERO
2.0000 | INHALATION_SPRAY | Freq: Two times a day (BID) | RESPIRATORY_TRACT | Status: DC
  Administered 2017-09-02 – 2017-09-09 (×14): 2 via RESPIRATORY_TRACT
  Filled 2017-09-01 (×4): qty 8.8

## 2017-09-01 MED ORDER — METOPROLOL TARTRATE 25 MG PO TABS
25.0000 mg | ORAL_TABLET | Freq: Two times a day (BID) | ORAL | Status: DC
  Administered 2017-09-01 – 2017-09-02 (×3): 25 mg via ORAL
  Filled 2017-09-01 (×3): qty 1

## 2017-09-01 MED ORDER — ACETAMINOPHEN 325 MG PO TABS
650.0000 mg | ORAL_TABLET | ORAL | Status: DC | PRN
  Administered 2017-09-04 – 2017-09-07 (×3): 650 mg via ORAL
  Filled 2017-09-01 (×4): qty 2

## 2017-09-01 MED ORDER — NITROGLYCERIN 0.4 MG SL SUBL: 0.4000 mg | SUBLINGUAL_TABLET | SUBLINGUAL | Status: DC | PRN

## 2017-09-01 MED ORDER — STROKE: EARLY STAGES OF RECOVERY BOOK
Freq: Once | Status: DC
  Filled 2017-09-01: qty 1

## 2017-09-01 MED ORDER — NICARDIPINE HCL IN NACL 20-0.86 MG/200ML-% IV SOLN: 0.0000 mg/h | INTRAVENOUS | Status: DC

## 2017-09-01 MED ORDER — GADOBENATE DIMEGLUMINE 529 MG/ML IV SOLN
15.0000 mL | Freq: Once | INTRAVENOUS | Status: AC | PRN
  Administered 2017-09-01: 15 mL via INTRAVENOUS

## 2017-09-01 MED ORDER — CLEVIDIPINE BUTYRATE 0.5 MG/ML IV EMUL
0.0000 mg/h | INTRAVENOUS | Status: DC
  Administered 2017-09-01: 2 mg/h via INTRAVENOUS

## 2017-09-01 MED ORDER — CLEVIDIPINE BUTYRATE 0.5 MG/ML IV EMUL
0.0000 mg/h | INTRAVENOUS | Status: DC
  Administered 2017-09-01: 1 mg/h via INTRAVENOUS

## 2017-09-01 MED ORDER — ACETAMINOPHEN 650 MG RE SUPP: 650.0000 mg | RECTAL | Status: DC | PRN

## 2017-09-01 NOTE — Progress Notes (Signed)
STROKE TEAM PROGRESS NOTE   HISTORY OF PRESENT ILLNESS (per record) Jaime Pierce is an 71 y.o. male who presents to the ED after experiencing acute onset of RUE weakness and incoordination during coitus. Initially noticed by his wife when he flailed his RUE which struck her. She also noted right facial droop. Following this, it was noted that he was dragging his right leg when he tried to walk. LKN was the same as TOSO, which was 1:30 AM. The patient's BP was 170/90 on EMS arrival. CBG was unremarkable. Symptoms improved en route, but were still present on arrival to the ED.   Home medications include ASA. He is not on a blood thinner.   PMHx includes CAD s/p CABG x 6, HLD, HTN and tobacco use.   Patient was not administered IV t-PA secondary to hemorrhage. He was admitted to the neuro ICU for further evaluation and treatment.   SUBJECTIVE (INTERVAL HISTORY) His wife and daughter are at the bedside.  He is awake and alert moving all extremities but weakness remains. He reports 3-4/10 sensation in his right left and arm. He cannot recall any trauma before or at the time of symptom onset. He reports monitoring home blood pressure occasionally usually in the 120s SBP on irbesartan and metoprolol with complications in the past. He does smoke 1/2 ppd PTA, have high cholesterol, and has known CAD history s/p CABG. Takes ASA but no anticoagulation at home.   OBJECTIVE Temp:  [97.7 F (36.5 C)-98.3 F (36.8 C)] 97.9 F (36.6 C) (10/10 1159) Pulse Rate:  [53-80] 64 (10/10 1530) Cardiac Rhythm: Normal sinus rhythm (10/10 1200) Resp:  [13-26] 19 (10/10 1530) BP: (93-189)/(44-94) 135/79 (10/10 1530) SpO2:  [90 %-96 %] 92 % (10/10 1530) Weight:  [187 lb 9.8 oz (85.1 kg)] 187 lb 9.8 oz (85.1 kg) (10/10 0200)  CBC:   Recent Labs Lab 09/01/17 0250 09/01/17 0255  WBC 8.8  --   NEUTROABS 4.2  --   HGB 15.9 15.6  HCT 46.3 46.0  MCV 92.4  --   PLT 199  --     Basic Metabolic Panel:    Recent Labs Lab 09/01/17 0250 09/01/17 0255  NA 138 140  K 4.1 4.0  CL 108 108  CO2 20*  --   GLUCOSE 106* 103*  BUN 16 18  CREATININE 1.00 0.90  CALCIUM 8.9  --     Lipid Panel:     Component Value Date/Time   CHOL 129 07/01/2017 0938   TRIG 83.0 07/01/2017 0938   TRIG 65 09/21/2006 0832   HDL 34.20 (L) 07/01/2017 0938   CHOLHDL 4 07/01/2017 0938   VLDL 16.6 07/01/2017 0938   LDLCALC 78 07/01/2017 0938   HgbA1c: No results found for: HGBA1C Urine Drug Screen: No results found for: LABOPIA, COCAINSCRNUR, LABBENZ, AMPHETMU, THCU, LABBARB  Alcohol Level     Component Value Date/Time   ETH <10 09/01/2017 0250    IMAGING  Ct Angio Head W Or Wo Contrast Ct Angio Neck W Or Wo Contrast 09/01/2017 1. Mild intracranial and cervical atherosclerosis without large vessel occlusion or significant proximal stenosis. 2. No evidence of cerebral aneurysm or vascular malformation. 3. Mild interval enlargement of the left parietal hematoma. No midline shift. 4. Scattered small volume subarachnoid hemorrhage.  [name] Wo Contrast 09/01/2017 1. Similar appearance of left parietal intraparenchymal hematoma measuring 2.7 x 4.8 x 3.9 cm. Localized vasogenic edema without significant regional mass effect. No underlying mass lesion or other  abnormality. 2. Small volume acute subarachnoid hemorrhage involving the bilateral cerebral hemispheres as above. 3. Moderate cerebral atrophy with chronic small vessel ischemic disease. 4. 14 mm lesion positioned within the left parotid gland, indeterminate. Follow-up examination with dedicated neck CT with contrast suggested for further evaluation on a nonemergent basis.  Ct Head Code Stroke Wo Contrast 09/01/2017 4.2 x 2.5 x 3.1 cm left parietal lobe intraparenchymal hematoma (estimated volume 16 cc). Mild localized vasogenic edema without significant regional mass effect. 2. Small volume acute subarachnoid hemorrhage within the adjacent left  parietal lobe as well as at the anterior right frontal lobe. 3. Moderate cerebral atrophy with chronic small vessel ischemic disease.    PHYSICAL EXAM  Temp:  [97.7 F (36.5 C)-98.3 F (36.8 C)] 97.9 F (36.6 C) (10/10 1159) Pulse Rate:  [53-80] 64 (10/10 1530) Resp:  [13-26] 19 (10/10 1530) BP: (93-189)/(44-94) 135/79 (10/10 1530) SpO2:  [90 %-96 %] 92 % (10/10 1530) Weight:  [187 lb 9.8 oz (85.1 kg)] 187 lb 9.8 oz (85.1 kg) (10/10 0200)  General - Well nourished, well developed, in no apparent distress.  Cardiovascular - Regular rate and rhythm.  Mental Status -  Level of arousal and orientation to time, place, and person were intact. Language including expression, naming, repetition, comprehension was assessed and found intact. Attention span and concentration were normal. Recent and remote memory were intact.  Cranial Nerves II - XII - II - Visual field intact OU. III, IV, VI - Extraocular movements intact. V - Facial sensation intact bilaterally. VII - Faint R facial droop VIII - Hearing intact bilaterally X - Palate elevates symmetrically. XI - Chin turning & shoulder shrug intact bilaterally. XII - Tongue protrusion intact and midline.  Motor Strength - The patient's strength was normal in all extremities and pronator drift was absent.  Strength was 4/5 in RUE, 4+/5 RLE, 5/5 on left  Reflexes - The patient's reflexes were 1-2+ in all extremities and he had no pathological reflexes.  Sensory - Right sided touch sensation subjectively diminished compared to left in all extremities  Coordination - Normal finger to nose, no tremor, rapid hand movements slowed in R hand.  Gait and Station - Not observed   ASSESSMENT/PLAN [name] is a 71 y.o. male with history of CAD s/p CABG, hypertension, hyperlipidemia, COPD, tobacco use presenting with acute ICH. He did not receive IV t-PA due to hemorrhage.   Stroke:  Left parietal lobe intraparenchymal hematoma  most likely due to hypertension and micorvascular disease, tiny opposite R anterior lesion CTA pending  Resultant  R arm and leg mild weakness with parasthesia  CT head - 4.2 x 2.5 x 3.1 cm left parietal lobe intraparenchymal hematoma (estimated volume 16 cc). Mild localized vasogenic edema without significant regional mass effect. Small volume acute subarachnoid hemorrhage within the adjacent left parietal lobe as well as at the anterior right frontal lobe. Moderate cerebral atrophy with chronic small vessel ischemic disease.  MRI head - As CT above, 14 mm lesion positioned within the left parotid gland, indeterminate. Follow-up examination with dedicated neck CT with contrast suggested for further evaluation on a nonemergent basis.  MRA head Not performed  CTA head Mild intracranial and cervical atherosclerosis without large vessel occlusion or significant proximal stenosis. Mild interval enlargement of the left parietal hematoma. No midline shift.  CTA neck plques present without significant stenosis  Carotid Doppler CTA neck   2D Echo  pending  LDL pending (78 07/01/17)  HgbA1c pending  SCDs for VTE prophylaxis Diet Heart Room service appropriate? Yes; Fluid consistency: Thin  aspirin 81 mg daily prior to admission, now on No antithrombotic  Patient counseled to be compliant with his antithrombotic medications  Ongoing aggressive stroke risk factor management  Therapy recommendations:  Pending/No SLP needed  Disposition:  pending  Hypertension  Stable  Control SBP <140 today  Long-term BP goal normotensive  Hyperlipidemia  Home meds:  atorvastatin   LDL pending, goal < 70  Continue statin at discharge  Diabetes  HgbA1c pending, goal < 7.0   Other Stroke Risk Factors  Advanced age  Cigarette smoker advised to stop smoking  Coronary artery disease   Hospital day # 0  Intraparenchymal hemorrhage in the L parietal lobe causing current R arm and leg  deficits. The exact cause is unclear from imaging studies so far. HE does have extensive microvascular damage likely intracranial atherosclerosis with smoking, HLD Hx. Tiny hemorrhage in R anterior location less likely explained by contrecoup injury without any known trauma. He needs continued monitoring in ICU setting today with tight BP control. Slight expansion in hematoma may be expected transformation over one day.   To contact Stroke Continuity provider, please refer to [email or web address]. After hours, contact General Neurology

## 2017-09-01 NOTE — ED Provider Notes (Signed)
[facility]-EMERGENCY DEPT Provider Note   CSN: [id number] Arrival date & time: 09/01/17  0247     History   Chief Complaint Chief Complaint  Patient presents with  . Code Stroke    HPI Jaime Pierce is a 71 y.o. male.  Patient brought to the ER from home by ambulance for possible stroke. Patient noticed sudden onset of clumsiness, weakness, numbness of the right side of his body. Symptoms began within the last hour. EMS report hypertension initially that has improved. He did have a facial droop that has improved and the weakness of his right arm and leg is still present but not as severe as it was. No headache or difficulty with speech.      Past Medical History:  Diagnosis Date  . Coronary artery disease   . Dyspnea on exertion   . Hyperlipidemia   . Hypertension   . Tobacco abuse     Patient Active Problem List   Diagnosis Date Noted  . Well adult exam 06/16/2016  . Cigarette smoker 02/23/2016  . COPD GOLD II  12/24/2014  . CAD (coronary artery disease) 07/22/2011  . Essential hypertension 07/22/2011  . Dyslipidemia 07/22/2011  . Smoker 07/22/2011    Past Surgical History:  Procedure Laterality Date  . CORONARY ARTERY BYPASS GRAFT         Home Medications    Prior to Admission medications   Medication Sig Start Date End Date Taking? Authorizing Provider  albuterol (PROAIR HFA) 108 (90 Base) MCG/ACT inhaler 2 puffs up to every 4 hours if can't catch your breath 03/25/17   Nyoka Cowden, MD  aspirin 81 MG tablet Take 81 mg by mouth daily.      [provider]  atorvastatin (LIPITOR) 40 MG tablet Take 1 tablet (40 mg total) by mouth daily. 03/17/16   Plotnikov, Georgina Quint, MD  budesonide-formoterol (SYMBICORT) 160-4.5 MCG/ACT inhaler Take 2 puffs first thing in am and then another 2 puffs about 12 hours later. 03/25/17   Nyoka Cowden, MD  cetirizine (ZYRTEC) 10 MG tablet Take 10 mg by mouth daily.    [provider]  irbesartan (AVAPRO) 150  MG tablet Take 1 tablet (150 mg total) by mouth daily. 06/30/17   Plotnikov, Georgina Quint, MD  metoprolol (LOPRESSOR) 50 MG tablet Take 0.5 tablets (25 mg total) by mouth 2 (two) times daily. 03/17/16   Plotnikov, Georgina Quint, MD  nitroGLYCERIN (NITROSTAT) 0.4 MG SL tablet Place 1 tablet (0.4 mg total) under the tongue every 5 (five) minutes as needed for chest pain. 06/30/17 10/24/21  Plotnikov, Georgina Quint, MD    Family History Family History  Problem Relation Age of Onset  . Heart disease Father   . Heart disease Maternal Grandfather   . CVA Mother     Social History Social History  Substance Use Topics  . Smoking status: Current Every Day Smoker    Packs/day: 0.50    Years: 50.00    Types: Cigarettes  . Smokeless tobacco: Never Used  . Alcohol use 0.0 oz/week     Comment: occasional     Allergies   Sulfonamide derivatives   Review of Systems Review of Systems  Neurological: Positive for weakness and numbness.  All other systems reviewed and are negative.    Physical Exam Updated Vital Signs Ht 6' (1.829 m)   Wt 85.1 kg (187 lb 9.8 oz)   BMI 25.44 kg/m   Physical Exam  Constitutional: He is oriented to person, place,  and time. He appears well-developed and well-nourished. No distress.  HENT:  Head: Normocephalic and atraumatic.  Right Ear: Hearing normal.  Left Ear: Hearing normal.  Nose: Nose normal.  Mouth/Throat: Oropharynx is clear and moist and mucous membranes are normal.  Eyes: Pupils are equal, round, and reactive to light. Conjunctivae and EOM are normal.  Neck: Normal range of motion. Neck supple.  Cardiovascular: Regular rhythm, S1 normal and S2 normal.  Exam reveals no gallop and no friction rub.   No murmur heard. Pulmonary/Chest: Effort normal and breath sounds normal. No respiratory distress. He exhibits no tenderness.  Abdominal: Soft. Normal appearance and bowel sounds are normal. There is no hepatosplenomegaly. There is no tenderness. There is no  rebound, no guarding, no tenderness at McBurney's point and negative Murphy's sign. No hernia.  Musculoskeletal: Normal range of motion.  Neurological: He is alert and oriented to person, place, and time. No cranial nerve deficit or sensory deficit. Coordination normal. GCS eye subscore is 4. GCS verbal subscore is 5. GCS motor subscore is 6.  Right upper extremity pronator drift, right lower extremity drift  Skin: Skin is warm, dry and intact. No rash noted. No cyanosis.  Psychiatric: He has a normal mood and affect. His speech is normal and behavior is normal. Thought content normal.  Nursing note and vitals reviewed.    ED Treatments / Results  Labs (all labs ordered are listed, but only abnormal results are displayed) Labs Reviewed  I-STAT CHEM 8, ED - Abnormal; Notable for the following:       Result Value   Glucose, Bld 103 (*)    Calcium, Ion 0.98 (*)    All other components within normal limits  CBC  DIFFERENTIAL  ETHANOL  PROTIME-INR  APTT  COMPREHENSIVE METABOLIC PANEL  RAPID URINE DRUG SCREEN, HOSP PERFORMED  URINALYSIS, ROUTINE W REFLEX MICROSCOPIC  I-STAT TROPONIN, ED    EKG  EKG Interpretation None       Radiology No results found.  Procedures Procedures (including critical care time)  Medications Ordered in ED Medications  clevidipine (CLEVIPREX) infusion 0.5 mg/mL (not administered)     Initial Impression / Assessment and Plan / ED Course  I have reviewed the triage vital signs and the nursing notes.  Pertinent labs & imaging results that were available during my care of the patient were reviewed by me and considered in my medical decision making (see chart for details).     Patient presents to the ER for evaluation of strokelike symptoms. Patient had sudden onset of right-sided weakness after having sex. Patient was initially hypertensive but this has improved during transport. Patient presented as a code stroke. He was seen to have an  intracranial bleed on head CT. Patient will be admitted by neurology.  CRITICAL CARE Performed by: Gilda Crease   Total critical care time: 30 minutes  Critical care time was exclusive of separately billable procedures and treating other patients.  Critical care was necessary to treat or prevent imminent or life-threatening deterioration.  Critical care was time spent personally by me on the following activities: development of treatment plan with patient and/or surrogate as well as nursing, discussions with consultants, evaluation of patient's response to treatment, examination of patient, obtaining history from patient or surrogate, ordering and performing treatments and interventions, ordering and review of laboratory studies, ordering and review of radiographic studies, pulse oximetry and re-evaluation of patient's condition.   Final Clinical Impressions(s) / ED Diagnoses   Final diagnoses:  Intracranial  bleed [facility])    New Prescriptions New Prescriptions   No medications on file     Gilda Crease, MD 09/01/17 [phone]

## 2017-09-01 NOTE — Evaluation (Signed)
Speech Language Pathology Evaluation Patient Details Name: Jaime Pierce MRN: [id number] DOB: 1946-07-19 Today's Date: 09/01/2017 Time: 1001-1027 SLP Time Calculation (min) (ACUTE ONLY): 26 min  Problem List:  Patient Active Problem List   Diagnosis Date Noted  . ICH (intracerebral hemorrhage) (HCC) 09/01/2017  . Well adult exam 06/16/2016  . Cigarette smoker 02/23/2016  . COPD GOLD II  12/24/2014  . CAD (coronary artery disease) 07/22/2011  . Essential hypertension 07/22/2011  . Dyslipidemia 07/22/2011  . Smoker 07/22/2011   Past Medical History:  Past Medical History:  Diagnosis Date  . Coronary artery disease   . Dyspnea on exertion   . Hyperlipidemia   . Hypertension   . Tobacco abuse    Past Surgical History:  Past Surgical History:  Procedure Laterality Date  . CORONARY ARTERY BYPASS GRAFT     HPI:  Pt is a 71 y.o.malewho presents to the ED after experiencing acute onset of right sided weakness, right facial droop and incoordination. PMHx includes CAD s/p CABG, HLD, HTN and tobacco use. MRI shows left parietal intraparenchymal hematoma, localized vasogenic edema without significant regional mass effect; small volume acute subarachnoid hemorrhage involving the bilateral cerebral hemispheres.   Assessment / Plan / Recommendation Clinical Impression  Pt presents with mild cognitive deficits in the area of working memory and memory retrieval. Noted difficulty with retrieval of words/numbers when completing memory task and serial subtraction task on the [facility]; overall score on [facility] 19/25 with primary deficits being delayed recall. Educated family on mild concern for memory deficits and compensatory strategies such as external memory aids that may be helpful after discharge. Per family, speech is at baseline and no dysarthria is noted. Pt has adequate assistance at home. No f/u SLP needed at this time, will sign off.     SLP Assessment  SLP Recommendation/Assessment:  Patient does not need any further Speech Lanaguage Pathology Services SLP Visit Diagnosis: Frontal lobe and executive function deficit Frontal lobe and executive function deficit following: Cerebral infarction    Follow Up Recommendations       Frequency and Duration           SLP Evaluation Cognition  Overall Cognitive Status: Impaired/Different from baseline Arousal/Alertness: Awake/alert Orientation Level: Oriented X4 Attention: Sustained Sustained Attention: Appears intact Memory: Impaired Memory Impairment: Retrieval deficit Awareness: Appears intact Problem Solving: Appears intact Executive Function: Reasoning;Initiating;Self Correcting;Self Monitoring Reasoning: Appears intact Initiating: Appears intact Self Monitoring: Appears intact Self Correcting: Appears intact Safety/Judgment: Appears intact       Comprehension  Auditory Comprehension Overall Auditory Comprehension: Appears within functional limits for tasks assessed Conversation: Complex Interfering Components: Working Radio broadcast assistant: Extra processing time    Expression Verbal Expression Overall Verbal Expression: Appears within functional limits for tasks assessed Written Expression Dominant Hand: Right   Oral / Motor  Oral Motor/Sensory Function Overall Oral Motor/Sensory Function: Within functional limits Motor Speech Overall Motor Speech: Appears within functional limits for tasks assessed   GO                    Carmela Rima, Student SLP 09/01/2017, 10:50 AM

## 2017-09-01 NOTE — Progress Notes (Signed)
  Echocardiogram 2D Echocardiogram has been performed.  Jaime Pierce L Androw 09/01/2017, 3:47 PM

## 2017-09-01 NOTE — Progress Notes (Signed)
At 1300 neuro check noted patient less able to move Right arm. Dr. Roda Shutters notified. STAT CT ordered. Will continue to monitor.

## 2017-09-01 NOTE — ED Notes (Signed)
Dr. Otelia Limes paged to confirm orders due to pt bradycardia.

## 2017-09-01 NOTE — Progress Notes (Signed)
PT Cancellation Note  Patient Details Name: BRENEN BEIGEL MRN: [id number] DOB: 1946-02-01   Cancelled Treatment:    Reason Eval/Treat Not Completed: Medical issues which prohibited therapy;Other (comment) ( bed rest) PT will check back later today or tomorrow as time allows.  Thanks,   Rollene Rotunda. Neisha Hinger, PT, DPT [phone]   09/01/2017, 10:52 AM

## 2017-09-01 NOTE — ED Triage Notes (Signed)
Pt arrived EMS from home with c/o right sided weakness and R sided facial droop. Pt was being intimate with wife when he developed the symptoms.

## 2017-09-01 NOTE — Code Documentation (Signed)
Code Stroke, c/o right sided weakness, facial droops, un-coordination on right side. CT + 4.2 x 2.5 x 3.1 cm left parietal lobe intraparenchymal hematoma (estimated volume 16 cc). Mild localized vasogenic edema without significant regional mass effect. Small volume acute subarachnoid hemorrhage within the adjacent left parietal lobe as well as at the anterior right frontal lobe.  Patient was hypertensive, Cleviprex started. NIH 6.

## 2017-09-01 NOTE — ED Notes (Signed)
Cleviprex held per dr. Otelia Limes. To restarting BP is above 140 systolic

## 2017-09-01 NOTE — Consult Note (Addendum)
NEUROHOSPITALIST SERVICE - HISTORY AND PHYSICAL   Requestig physician: Dr. Blinda Leatherwood  Reason for Consult: Acute onset of RUE weakness and incoordination  History obtained from:  Patient     HPI:                                                                                                                                          LONALD TROIANI is an 71 y.o. male who presents to the ED after experiencing acute onset of RUE weakness and incoordination during coitus. Initially noticed by his wife when he flailed his RUE which struck her. She also noted right facial droop. Following this, it was noted that he was dragging his right leg when he tried to walk. LKN was the same as TOSO, which was 1:30 AM. The patient's BP was 170/90 on EMS arrival. CBG was unremarkable. Symptoms improved en route, but were still present on arrival to the ED.   Home medications include ASA. He is not on a blood thinner.   PMHx includes CAD s/p CABG x 6, HLD, HTN and tobacco use.    Past Medical History:  Diagnosis Date  . Coronary artery disease   . Dyspnea on exertion   . Hyperlipidemia   . Hypertension   . Tobacco abuse     Past Surgical History:  Procedure Laterality Date  . CORONARY ARTERY BYPASS GRAFT      Family History  Problem Relation Age of Onset  . Heart disease Father   . Heart disease Maternal Grandfather   . CVA Mother     Social History:  reports that he has been smoking Cigarettes.  He has a 25.00 pack-year smoking history. He has never used smokeless tobacco. He reports that he drinks alcohol. He reports that he does not use drugs.  Allergies  Allergen Reactions  . Sulfonamide Derivatives     HOME MEDICATIONS:                                                                                                                       ROS:  Denies headache or speech changes except for mild dysarthria.   Blood pressure (!) 189/85, pulse 64, temperature 98.3 F (36.8 C), temperature source Temporal, resp. rate 19, height 6' (1.829 m), weight 85.1 kg (187 lb 9.8 oz), SpO2 93 %.  General Examination:                                                                                                      HEENT-  [place]/AT    Lungs- Respirations unlabored Extremities- No edema  Neurological Examination Mental Status: Alert and fully oriented with good insight. Mild dysarthria. Speech fluent with intact comprehension and naming. Able to follow all commands without difficulty. Cranial Nerves: II: Visual fields intact. PERRL.   III,IV, VI: EOMI without nystagmus. No ptosis.  V,VII: No facial droop at time of neurological exam. Decreased temp sensation on the right.   VIII: hearing intact to voice IX,X: Palate rises symmetrically XI: Symmetric XII: midline tongue extension Motor: RUE 4+/5 proximal and distal RLE 5/5 LUE and LLE 5/5 Normal tone throughout; no atrophy noted Sensory: Decreased temp sensation RUE and RLE. Absent fine touch and pressure sensation RLE. Decreased fine touch sensation RUE. Extinction on the right.   Deep Tendon Reflexes: 1-2+ and symmetric throughout Plantars: Right: downgoing  Left: downgoing Cerebellar: Mild sensory ataxia with right FNF and right heel-shin testing.  Gait: Deferred   Lab Results: Basic Metabolic Panel:  Recent Labs Lab 09/01/17 0255  NA 140  K 4.0  CL 108  GLUCOSE 103*  BUN 18  CREATININE 0.90    Liver Function Tests: No results for input(s): AST, ALT, ALKPHOS, BILITOT, PROT, ALBUMIN in the last 168 hours. No results for input(s): LIPASE, AMYLASE in the last 168 hours. No results for input(s): AMMONIA in the last 168 hours.  CBC:  Recent Labs Lab 09/01/17 0250 09/01/17 0255  WBC 8.8  --   NEUTROABS 4.2  --   HGB 15.9 15.6  HCT 46.3 46.0  MCV 92.4  --   PLT  199  --     Cardiac Enzymes: No results for input(s): CKTOTAL, CKMB, CKMBINDEX, TROPONINI in the last 168 hours.  Lipid Panel: No results for input(s): CHOL, TRIG, HDL, CHOLHDL, VLDL, LDLCALC in the last 168 hours.  CBG: No results for input(s): GLUCAP in the last 168 hours.  Microbiology: No results found for this or any previous visit.  Coagulation Studies: No results for input(s): LABPROT, INR in the last 72 hours.  CT head:   1. 4.2 x 2.5 x 3.1 cm left parietal lobe intraparenchymal hematoma (estimated volume 16 cc). Mild localized vasogenic edema without significant regional mass effect. 2. Small volume acute subarachnoid hemorrhage within the adjacent left parietal lobe as well as at the anterior right frontal lobe. 3. Moderate cerebral atrophy with chronic small vessel ischemic disease.  Assessment: 71 year old male presenting with ICH, most likely secondary to elevated BP during intercourse 1. CT head reveals an acute medium-sized left parietofrontal ICH with a small amount of adjacent subarachnoid hemorrhage. Also noted is a small right anterior frontal lobe subarachnoid  hemorrhage.  2. Examination reveals deficits localizable to the left parietofrontal region, which correlates with the parenchymal hemorrhage seen on CT. 3. CAD s/p CABG x 6. On ASA for prevention of MI.   Recommendations: 1. Admit to ICU under Neurology service.  2. Hold ASA. Most likely will need to be restarted given his CAD, provided that his hemorrhage is stable/regressing after 5-7 days on repeat CT. However, if there are findings consistent with amyloid angiopathy on MRI, or if aneurysm is seen on MRA, then whether or not to restart ASA will be a more difficult clinical decision.  3. BP management with clevidipine and labetalol. SBP goal of < 140 4. PT/OT/Speech 5. MRI brain with contrast to assess for possible underlying lesion, such as a mass or amyloid angiopathy 6. Frequent neuro checks 7. No  anticoagulants. DVT prophylaxis with SCDs 8. MRA head to evaluate for possible aneurysm.  50 minutes spent in the emergent neurological evaluation and management of this critically ill acute intracerebral hemorrhage patient  Electronically signed: Dr. Caryl Pina 09/01/2017, 3:11 AM

## 2017-09-01 NOTE — Progress Notes (Signed)
OT Cancellation Note  Patient Details Name: Jaime Pierce MRN: [id number] DOB: 10/02/1946   Cancelled Treatment:    Reason Eval/Treat Not Completed: Patient not medically ready. Pt on bedrest. Please update activity orders when appropriate.  [facility] Celestine Prim, OT/L  [phone] 09/01/2017 09/01/2017, 7:53 AM

## 2017-09-01 NOTE — Progress Notes (Signed)
   09/01/17 1100  Clinical Encounter Type  Visited With Patient and family together  Visit Type Spiritual support  Referral From Chaplain  Consult/Referral To Chaplain  Spiritual Encounters  Spiritual Needs Prayer  Stress Factors  Patient Stress Factors Health changes  Family Stress Factors Exhausted;Major life changes;Loss  Chaplain visited with the family during rounds.  The PT along with daughter and wife were present.  The family was able to cry together as chaplain created a safe space for them to be expressive.  Daughter of PT seems to be the most concerned although PT was concerned as well.  Chaplain reinforced the humanity of being able to express their emotions.  Family requested prayer with chaplain.

## 2017-09-02 ENCOUNTER — Inpatient Hospital Stay (HOSPITAL_COMMUNITY): Payer: Medicare HMO

## 2017-09-02 ENCOUNTER — Encounter: Payer: Self-pay | Admitting: Internal Medicine

## 2017-09-02 DIAGNOSIS — E538 Deficiency of other specified B group vitamins: Secondary | ICD-10-CM

## 2017-09-02 DIAGNOSIS — I10 Essential (primary) hypertension: Secondary | ICD-10-CM

## 2017-09-02 DIAGNOSIS — I69398 Other sequelae of cerebral infarction: Secondary | ICD-10-CM

## 2017-09-02 DIAGNOSIS — I69390 Apraxia following cerebral infarction: Secondary | ICD-10-CM

## 2017-09-02 DIAGNOSIS — R269 Unspecified abnormalities of gait and mobility: Secondary | ICD-10-CM

## 2017-09-02 DIAGNOSIS — I609 Nontraumatic subarachnoid hemorrhage, unspecified: Secondary | ICD-10-CM

## 2017-09-02 DIAGNOSIS — G8191 Hemiplegia, unspecified affecting right dominant side: Secondary | ICD-10-CM

## 2017-09-02 DIAGNOSIS — F17200 Nicotine dependence, unspecified, uncomplicated: Secondary | ICD-10-CM

## 2017-09-02 LAB — BASIC METABOLIC PANEL
Anion gap: 9 (ref 5–15)
BUN: 13 mg/dL (ref 6–20)
CALCIUM: 9 mg/dL (ref 8.9–10.3)
CO2: 23 mmol/L (ref 22–32)
CREATININE: 0.96 mg/dL (ref 0.61–1.24)
Chloride: 107 mmol/L (ref 101–111)
GFR calc Af Amer: >60 mL/min (ref >60)
GFR calc non Af Amer: >60 mL/min (ref >60)
GLUCOSE: 99 mg/dL (ref 65–99)
Potassium: 4.1 mmol/L (ref 3.5–5.1)
Sodium: 139 mmol/L (ref 135–145)

## 2017-09-02 LAB — LIPID PANEL
Cholesterol: 131 mg/dL (ref 0–200)
HDL: 35 mg/dL — ABNORMAL LOW (ref >40)
LDL Cholesterol: 81 mg/dL (ref 0–99)
Total CHOL/HDL Ratio: 3.7 RATIO
Triglycerides: 73 mg/dL (ref <150)
VLDL: 15 mg/dL (ref 0–40)

## 2017-09-02 LAB — CBC
HEMATOCRIT: 46.5 % (ref 39.0–52.0)
Hemoglobin: 15.7 g/dL (ref 13.0–17.0)
MCH: 31.3 pg (ref 26.0–34.0)
MCHC: 33.8 g/dL (ref 30.0–36.0)
MCV: 92.8 fL (ref 78.0–100.0)
Platelets: 210 10*3/uL (ref 150–400)
RBC: 5.01 MIL/uL (ref 4.22–5.81)
RDW: 14.6 % (ref 11.5–15.5)
WBC: 12.1 10*3/uL — ABNORMAL HIGH (ref 4.0–10.5)

## 2017-09-02 LAB — TSH: TSH: 1.753 u[IU]/mL (ref 0.350–4.500)

## 2017-09-02 LAB — HEMOGLOBIN A1C
HEMOGLOBIN A1C: 5.9 % — AB (ref 4.8–5.6)
Mean Plasma Glucose: 122.63 mg/dL

## 2017-09-02 LAB — VITAMIN B12: VITAMIN B 12: 198 pg/mL (ref 180–914)

## 2017-09-02 MED ORDER — CYANOCOBALAMIN 1000 MCG/ML IJ SOLN
1000.0000 ug | Freq: Once | INTRAMUSCULAR | Status: AC
  Administered 2017-09-02: 1000 ug via INTRAMUSCULAR
  Filled 2017-09-02: qty 1

## 2017-09-02 MED ORDER — VITAMIN B-12 1000 MCG PO TABS
1000.0000 ug | ORAL_TABLET | Freq: Every day | ORAL | Status: DC
Start: 2017-09-03 — End: 2017-09-09
  Administered 2017-09-03 – 2017-09-09 (×7): 1000 ug via ORAL
  Filled 2017-09-02 (×7): qty 1

## 2017-09-02 MED ORDER — METOPROLOL TARTRATE 25 MG PO TABS
25.0000 mg | ORAL_TABLET | Freq: Every day | ORAL | Status: DC
  Administered 2017-09-03 – 2017-09-09 (×7): 25 mg via ORAL
  Filled 2017-09-02 (×7): qty 1

## 2017-09-02 NOTE — Evaluation (Signed)
Occupational Therapy Evaluation Patient Details Name: Jaime Pierce MRN: [id number] DOB: [birth year] Today's Date: 09/02/2017    History of Present Illness 71 y.o. male admitted on 09/01/17 for R sided weakness with CT revealing a L parietal lobe intraparenchymal hematoma, small volume SAH in adjacent L parietal lobe as well as R frontal lobe.  Pt with significant PMH of HTN, DOE, CAD, and CABG.     Clinical Impression   Pt reports he was independent with ADL PTA. Currently pt requires mod assist +2 for stand pivot transfer and max assist overall for ADL. Pt presenting with R sided weakness/decreased sensation, poor sitting/standing balance, impaired cognition impacting his independence and safety with ADL and functional mobility. Pt very motivated to participate in therapy this session with supportive wife present throughout. Recommending CIR level therapies to maximize independence and safety with ADL and functional mobility prior to return home. Pt would benefit from continued skilled OT to address established goals.    Follow Up Recommendations  CIR;Supervision/Assistance - 24 hour    Equipment Recommendations  Other (comment) (TBD at next venue)    Recommendations for Other Services       Precautions / Restrictions Precautions Precautions: Fall Precaution Comments: right sided weakness Restrictions Weight Bearing Restrictions: No      Mobility Bed Mobility Overal bed mobility: Needs Assistance Bed Mobility: Supine to Sit     Supine to sit: +2 for physical assistance;Mod assist;HOB elevated     General bed mobility comments: Two person mod assist to help right leg mostly and trunk for balance during transitions.  HOB elevated to ~35 degrees  Transfers Overall transfer level: Needs assistance Equipment used: 2 person hand held assist Transfers: Sit to/from [employer] Sit to Stand: +2 physical assistance;Mod assist Stand pivot transfers: +2  physical assistance;Mod assist       General transfer comment: Two person heavy mod assist to stand EOB with right knee blocked (it does buckle when block loosened).  Assist needed to weight shift left and ID that he needs to weight shift left in standing.  Heavy mod assist to pivot on his left stronger foot while blocking and helping to progress his right foot around to the chair.  support at trunk and continued right lateral lean statically and more so dynamically     Balance Overall balance assessment: Needs assistance Sitting-balance support: Feet supported;Single extremity supported Sitting balance-Leahy Scale: Poor Sitting balance - Comments: mod assist EOB due to right lateral lean (initially pushing with left hand to the right).  Can be as good as close supervision once settled.   Postural control: Right lateral lean Standing balance support: Bilateral upper extremity supported Standing balance-Leahy Scale: Poor Standing balance comment: two person mod assist with right leg blocked cues and assist for weight shift left.  Pre gait attempted and pt unable to lift left leg to step, but was able to unweight it enough to move it/pivot it in standing.                             ADL either performed or assessed with clinical judgement   ADL Overall ADL's : Needs assistance/impaired Eating/Feeding: Moderate assistance;Sitting   Grooming: Moderate assistance;Sitting   Upper Body Bathing: Maximal assistance;Sitting   Lower Body Bathing: Maximal assistance;Sit to/from stand   Upper Body Dressing : Moderate assistance;Sitting   Lower Body Dressing: Maximal assistance;Sit to/from stand   Toilet Transfer: Moderate assistance;+2 for physical assistance;Stand-pivot  Toilet Transfer Details (indicate cue type and reason): Simulated by transfer EOB>chair         Functional mobility during ADLs: Moderate assistance;+2 for physical assistance       Vision Baseline  Vision/History: Wears glasses Wears Glasses: Reading only Patient Visual Report: No change from baseline Vision Assessment?: No apparent visual deficits Additional Comments: Needs further assement in functional context     Perception     Praxis      Pertinent Vitals/Pain Pain Assessment: Faces Faces Pain Scale: Hurts even more Pain Location: right leg Pain Descriptors / Indicators: Burning;Cramping Pain Intervention(s): Limited activity within patient's tolerance;Monitored during session;Repositioned     Hand Dominance Right   Extremity/Trunk Assessment Upper Extremity Assessment Upper Extremity Assessment: RUE deficits/detail RUE Deficits / Details: Trace biceps/triceps noted; otherwise no active movement noted. Full PROM. Pt reports numbness thorughout RUE RUE Sensation: decreased light touch RUE Coordination: decreased fine motor;decreased gross motor   Lower Extremity Assessment Lower Extremity Assessment: Defer to PT evaluation RLE Deficits / Details: ankle DF 3-/5, PF3-/5, knee extension 3-/5, hip flexion 3-/5.  Also, some periods of increased flexion tone in sitting EOB as PT tried to get his right leg positioned better, seemingly a pain response as it was uncomfortable for me to touch his leg.  He did better given the opportunity to actively move it to reposition.   RLE Sensation: decreased light touch RLE Coordination: decreased gross motor   Cervical / Trunk Assessment Cervical / Trunk Assessment: Normal   Communication Communication Communication: No difficulties   Cognition Arousal/Alertness: Awake/alert Behavior During Therapy:  (tearful, appropriately so) Overall Cognitive Status: Impaired/Different from baseline Area of Impairment: Attention;Following commands;Safety/judgement;Awareness;Problem solving                   Current Attention Level: Selective   Following Commands: Follows one step commands consistently;Follows multi-step commands  inconsistently Safety/Judgement: Decreased awareness of deficits Awareness: Emergent Problem Solving: Difficulty sequencing;Requires verbal cues;Requires tactile cues General Comments: Some mild cognitive deficits related to awareness noted, and higher level activities (for instance he got step forward with your left foot incorrect two times in a row on two consecutie stands), not aware of his lateral lean at times.    General Comments  O2 sats on RA dropped in staning to 87% x1, but came back up in sitting to 90-93%.  O2 [place] re-applied for support once transferred to chair.     Exercises     Shoulder Instructions      Home Living Family/patient expects to be discharged to:: Private residence Living Arrangements: Spouse/significant other Available Help at Discharge: Family;Available 24 hours/day (wife is also retired) Type of Home: House Home Access: Stairs to enter Secretary/administrator of Steps: 2 Entrance Stairs-Rails: None Home Layout: One level     Bathroom Shower/Tub: Producer, television/film/video: Standard     Home Equipment: Information systems manager - built in   Additional Comments: liked to play golf      Prior Functioning/Environment Level of Independence: Independent        Comments: still drives, retired        Pharmacist, community Problem List: Decreased strength;Decreased range of motion;Decreased activity tolerance;Impaired balance (sitting and/or standing);Decreased coordination;Decreased cognition;Decreased knowledge of use of DME or AE;Decreased safety awareness;Decreased knowledge of precautions;Cardiopulmonary status limiting activity;Impaired sensation;Impaired tone;Impaired UE functional use;Pain;Increased edema      OT Treatment/Interventions: Self-care/ADL training;Therapeutic exercise;Neuromuscular education;Energy conservation;DME and/or AE instruction;Therapeutic activities;Cognitive remediation/compensation;Patient/family education;Balance training    OT Goals(Current  goals  can be found in the care plan section) Acute Rehab OT Goals Patient Stated Goal: to work hard and rehab hard so he can get better OT Goal Formulation: With patient/family Time For Goal Achievement: 09/16/17 Potential to Achieve Goals: Good ADL Goals Pt Will Perform Grooming: with min assist;sitting Pt Will Transfer to Toilet: with min assist;stand pivot transfer;bedside commode Pt Will Perform Toileting - Clothing Manipulation and hygiene: with min assist;sit to/from stand Additional ADL Goal #1: Pt/wife will demonstrate proper positioning of RUE for protection and edema control.  OT Frequency: Min 3X/week   Barriers to D/C:            Co-evaluation PT/OT/SLP Co-Evaluation/Treatment: Yes Reason for Co-Treatment: For patient/therapist safety;To address functional/ADL transfers   OT goals addressed during session: ADL's and self-care      AM-PAC PT "6 Clicks" Daily Activity     Outcome Measure Help from another person eating meals?: A Lot Help from another person taking care of personal grooming?: A Lot Help from another person toileting, which includes using toliet, bedpan, or urinal?: A Lot Help from another person bathing (including washing, rinsing, drying)?: A Lot Help from another person to put on and taking off regular upper body clothing?: A Lot Help from another person to put on and taking off regular lower body clothing?: A Lot 6 Click Score: 12   End of Session Equipment Utilized During Treatment: Gait belt;Oxygen Nurse Communication: Mobility status  Activity Tolerance: Patient tolerated treatment well Patient left: in chair;with call bell/phone within reach;with family/visitor present  OT Visit Diagnosis: Unsteadiness on feet (R26.81);Other abnormalities of gait and mobility (R26.89);Muscle weakness (generalized) (M62.81);Hemiplegia and hemiparesis Hemiplegia - Right/Left: Right Hemiplegia - dominant/non-dominant: Dominant                Time:  6045-4098 OT Time Calculation (min): 29 min Charges:  OT General Charges $OT Visit: 1 Visit OT Evaluation $OT Eval Moderate Complexity: 1 Mod G-Codes:     Cheryln Balcom A. Brett Albino, M.S., OTR/L Pager: [phone]  Gaye Alken 09/02/2017, 2:14 PM

## 2017-09-02 NOTE — Progress Notes (Signed)
STROKE TEAM PROGRESS NOTE   SUBJECTIVE (INTERVAL HISTORY) His wife is at the bedside.  He is awake and alert moving all extremities except RUE flaccid. Yesterday afternoon symptoms acutely worsened with total inability to move right arm. CT head stat showed interval enlargement of L parietal lobar hematoma. Repeat head CT this morning showed no significant change when compared to yesterday afternoon. He is alert and working with therapy.  OBJECTIVE Temp:  [97.8 F (36.6 C)-100.5 F (38.1 C)] 97.8 F (36.6 C) (10/11 1210) Pulse Rate:  [59-87] 67 (10/11 1400) Cardiac Rhythm: Normal sinus rhythm (10/11 0400) Resp:  [15-25] 16 (10/11 1400) BP: (91-137)/(54-93) 91/70 (10/11 1400) SpO2:  [88 %-97 %] 94 % (10/11 1400)  CBC:   Recent Labs Lab 09/01/17 0250 09/01/17 0255 09/02/17 0314  WBC 8.8  --  12.1*  NEUTROABS 4.2  --   --   HGB 15.9 15.6 15.7  HCT 46.3 46.0 46.5  MCV 92.4  --  92.8  PLT 199  --  210    Basic Metabolic Panel:   Recent Labs Lab 09/01/17 0250 09/01/17 0255 09/02/17 0314  NA 138 140 139  K 4.1 4.0 4.1  CL 108 108 107  CO2 20*  --  23  GLUCOSE 106* 103* 99  BUN CREATININE 1.00 0.90 0.96  CALCIUM 8.9  --  9.0    Lipid Panel:     Component Value Date/Time   CHOL 131 09/02/2017 0314   TRIG 73 09/02/2017 0314   TRIG 65 09/21/2006 0832   HDL 35 (L) 09/02/2017 0314   CHOLHDL 3.7 09/02/2017 0314   VLDL 15 09/02/2017 0314   LDLCALC 81 09/02/2017 0314   HgbA1c:  Lab Results  Component Value Date   HGBA1C 5.9 (H) 09/02/2017   Urine Drug Screen: No results found for: LABOPIA, COCAINSCRNUR, LABBENZ, AMPHETMU, THCU, LABBARB  Alcohol Level     Component Value Date/Time   ETH <10 09/01/2017 0250    IMAGING I have personally reviewed the radiological images below and agree with the radiology interpretations.  Ct Head 09/02/2017 1. Interval increase in size of left parietal lobe intraparenchymal hematoma, now measuring 4.7 x 5.4 x 4.9 cm  (estimated volume 62 mL, previously 4.2 x 2.5 x 3.1 cm - 16 mL). Increased localized vasogenic edema and regional mass effect without midline shift. 2. Persistent small volume acute subarachnoid hemorrhage within the left parietotemporal region and right frontal lobe. 3. No other new acute intracranial abnormality.  09/01/2017 4.2 x 2.5 x 3.1 cm left parietal lobe intraparenchymal hematoma (estimated volume 16 cc). Mild localized vasogenic edema without significant regional mass effect. 2. Small volume acute subarachnoid hemorrhage within the adjacent left parietal lobe as well as at the anterior right frontal lobe. 3. Moderate cerebral atrophy with chronic small vessel ischemic disease.  Ct Angio Head W Or Wo Contrast Ct Angio Neck W Or Wo Contrast 09/01/2017 1. Mild intracranial and cervical atherosclerosis without large vessel occlusion or significant proximal stenosis. 2. No evidence of cerebral aneurysm or vascular malformation. 3. Mild interval enlargement of the left parietal hematoma. No midline shift. 4. Scattered small volume subarachnoid hemorrhage.  [name] Wo Contrast 09/01/2017 1. Similar appearance of left parietal intraparenchymal hematoma measuring 2.7 x 4.8 x 3.9 cm. Localized vasogenic edema without significant regional mass effect. No underlying mass lesion or other abnormality. 2. Small volume acute subarachnoid hemorrhage involving the bilateral cerebral hemispheres as above. 3. Moderate cerebral atrophy with chronic small vessel  ischemic disease. 4. 14 mm lesion positioned within the left parotid gland, indeterminate. Follow-up examination with dedicated neck CT with contrast suggested for further evaluation on a nonemergent basis.  TTE  - Left ventricle: The cavity size was normal. Systolic function was   normal. The estimated ejection fraction was in the range of 50%   to 55%. Wall motion was normal; there were no regional wall   motion abnormalities. Left  ventricular diastolic function   parameters were normal. - Ventricular septum: Septal motion showed paradox. These changes   are consistent with a post-thoracotomy state. - Aortic valve: There was trivial regurgitation. - Atrial septum: No defect or patent foramen ovale was identified.   PHYSICAL EXAM  Temp:  [97.8 F (36.6 C)-100.5 F (38.1 C)] 97.8 F (36.6 C) (10/11 1210) Pulse Rate:  [59-87] 67 (10/11 1400) Resp:  [15-25] 16 (10/11 1400) BP: (91-137)/(54-93) 91/70 (10/11 1400) SpO2:  [88 %-97 %] 94 % (10/11 1400)  General - Well nourished, well developed, in no apparent distress.  Cardiovascular - Regular rate and rhythm.  Mental Status -  Level of arousal and orientation to time, place, and person were intact. Language including expression, naming, repetition, comprehension was assessed and found intact. Attention span and concentration were normal.  Cranial Nerves II - XII - II - Visual field intact OU. III, IV, VI - Extraocular movements intact. V - Facial sensation intact bilaterally. VII - Faint R facial droop VIII - Hearing intact bilaterally X - Palate elevates symmetrically. XI - Chin turning & shoulder shrug intact bilaterally. XII - Tongue protrusion intact and midline.  Motor Strength - Strength is 0/5 in RUE and 3+/5 in RLE. Left sided strength was intact.  Reflexes - No pathological reflexes.  Sensory - Touch sensation remains decreased in RUE and partially in RLE.  Coordination - intact left UE and LE.   Gait and Station - Not tested   ASSESSMENT/PLAN Jaime Pierce is a 71 y.o. male with history of CAD s/p CABG, hypertension, hyperlipidemia, COPD, tobacco use presenting with acute ICH. He did not receive IV t-PA due to hemorrhage.   ICH:  Left parietal lobe large hematoma with small R frontal SAH, most likely due to hypertension and small vessel disease  Resultant  R arm flaccid and leg mild weakness with parasthesia  CT head - Left  parietal lobe hematoma with small R frontal SAH  MRI head - left parietal hematoma enlargement with small right frontal SAH  CTA head and neck - no aneurysm or AVM. Interval enlargement of the left parietal hematoma. No midline shift.  CT repeat 09/02/17 - stable left large parietal ICH  2D Echo  EF 50-55%  LDL 81  HgbA1c 5.9  SCDs for VTE prophylaxis Diet Heart Room service appropriate? Yes; Fluid consistency: Thin  aspirin 81 mg daily prior to admission, now on No antithrombotic  Patient counseled to be compliant with his antithrombotic medications  Ongoing aggressive stroke risk factor management  Therapy recommendations:  CIR  Disposition:  pending  Hypertension  Stable on home meds - irbesartan, metoprolol  SBP goal <140   Stable  Long-term BP goal normotensive  Hyperlipidemia  Home meds:  atorvastatin   LDL 81, goal < 70  Continue statin at discharge  Tobacco abuse  Current smoker  Smoking cessation counseling provided  Pt is willing to quit  Other Stroke Risk Factors  Advanced age  CAD s/p CABG  Other active condition  B 12 deficiency - B-12  198, B-12 supplements  Hospital day # 1  This patient is critically ill due to large left parietal ICH, right frontal SAH and at significant risk of neurological worsening, death form hematoma expansion, cerebral edema, brain herniation, seizure. This patient's care requires constant monitoring of vital signs, hemodynamics, respiratory and cardiac monitoring, review of multiple databases, neurological assessment, discussion with family, other specialists and medical decision making of high complexity. I spent 40 minutes of neurocritical care time in the care of this patient.  Marvel Plan, MD PhD Stroke Neurology 09/02/2017 5:36 PM   To contact Stroke Continuity provider, please refer to [email or web address]. After hours, contact General Neurology

## 2017-09-02 NOTE — Progress Notes (Signed)
OT Cancellation Note  Patient Details Name: Jaime Pierce MRN: [id number] DOB: Jun 16, 1946   Cancelled Treatment:    Reason Eval/Treat Not Completed: Patient not medically ready (active bedrest orders)  Gaye Alken M.S., OTR/L Pager: [phone]  09/02/2017, 7:17 AM

## 2017-09-02 NOTE — Evaluation (Signed)
Physical Therapy Evaluation Patient Details Name: Jaime Pierce MRN: [id number] DOB: 1946/02/13 Today's Date: 09/02/2017   History of Present Illness  71 y.o. male admitted on 09/01/17 for R sided weakness with CT revealing a L parietal lobe intraparenchymal hematoma, small volume SAH in adjacent L parietal lobe as well as R frontal lobe.  Pt with significant PMH of HTN, DOE, CAD, and CABG.    Clinical Impression  Pt is tearful over his loss of function during our session, very mildly impulsive with some awareness deficits and complex command following deficits.  He was able to stand and pivot to the recliner chair with two person assist and has great potential to be ambulatory again soon.  He is very determined and has great support from his wife at discharge.  He would benefit from intensive inpatient rehab level therapies prior to returning home.   PT to follow acutely for deficits listed below.       Follow Up Recommendations CIR    Equipment Recommendations  Rolling walker with 5" wheels;Other (comment);Wheelchair (measurements PT);Wheelchair cushion (measurements PT);3in1 (PT) (right platform RW)    Recommendations for Other Services Rehab consult     Precautions / Restrictions Precautions Precautions: Fall Precaution Comments: right sided weakness      Mobility  Bed Mobility Overal bed mobility: Needs Assistance Bed Mobility: Supine to Sit     Supine to sit: +2 for physical assistance;Mod assist;HOB elevated     General bed mobility comments: Two person mod assist to help right leg mostly and trunk for balance during transitions.  HOB elevated to ~35 degrees  Transfers Overall transfer level: Needs assistance Equipment used: 2 person hand held assist Transfers: Sit to/from [employer] Sit to Stand: +2 physical assistance;Mod assist Stand pivot transfers: +2 physical assistance;Mod assist       General transfer comment: Two person heavy mod  assist to stand EOB with right knee blocked (it does buckle when block loosened).  Assist needed to weight shift left and ID that he needs to weight shift left in standing.  Heavy mod assist to pivot on his left stronger foot while blocking and helping to progress his right foot around to the chair.  support at trunk and continued right lateral lean statically and more so dynamically   Ambulation/Gait             General Gait Details: not ready to attempt today.       Modified Rankin (Stroke Patients Only) Modified Rankin (Stroke Patients Only) Pre-Morbid Rankin Score: No symptoms Modified Rankin: Severe disability     Balance Overall balance assessment: Needs assistance Sitting-balance support: Feet supported;Single extremity supported Sitting balance-Leahy Scale: Poor Sitting balance - Comments: mod assist EOB due to right lateral lean (initially pushing with left hand to the right).  Can be as good as close supervision once settled.   Postural control: Right lateral lean Standing balance support: Bilateral upper extremity supported Standing balance-Leahy Scale: Poor Standing balance comment: two person mod assist with right leg blocked cues and assist for weight shift left.  Pre gait attempted and pt unable to lift left leg to step, but was able to unweight it enough to move it/pivot it in standing.                               Pertinent Vitals/Pain Pain Assessment: Faces Faces Pain Scale: Hurts even more Pain Location: right leg Pain Descriptors /  Indicators: Burning;Cramping Pain Intervention(s): Limited activity within patient's tolerance;Monitored during session;Repositioned    Home Living Family/patient expects to be discharged to:: Private residence Living Arrangements: Spouse/significant other Available Help at Discharge: Family;Available 24 hours/day (wife is also retired) Type of Home: House Home Access: Stairs to enter Entrance Stairs-Rails:  None Secretary/administrator of Steps: 2 Home Layout: One level Home Equipment: Information systems manager - built in Additional Comments: liked to play golf    Prior Function Level of Independence: Independent         Comments: still drives, retired     Higher education careers adviser   Dominant Hand: Right    Extremity/Trunk Assessment   Upper Extremity Assessment Upper Extremity Assessment: Defer to OT evaluation    Lower Extremity Assessment Lower Extremity Assessment: RLE deficits/detail RLE Deficits / Details: ankle DF 3-/5, PF3-/5, knee extension 3-/5, hip flexion 3-/5.  Also, some periods of increased flexion tone in sitting EOB as PT tried to get his right leg positioned better, seemingly a pain response as it was uncomfortable for me to touch his leg.  He did better given the opportunity to actively move it to reposition.   RLE Sensation: decreased light touch RLE Coordination: decreased gross motor    Cervical / Trunk Assessment Cervical / Trunk Assessment: Normal  Communication   Communication: No difficulties  Cognition Arousal/Alertness: Awake/alert Behavior During Therapy:  (tearful, appropriately so) Overall Cognitive Status: Impaired/Different from baseline Area of Impairment: Attention;Following commands;Safety/judgement;Awareness;Problem solving                   Current Attention Level: Selective   Following Commands: Follows one step commands consistently;Follows multi-step commands inconsistently Safety/Judgement: Decreased awareness of deficits Awareness: Emergent Problem Solving: Difficulty sequencing;Requires verbal cues;Requires tactile cues General Comments: Some mild cognitive deficits related to awareness noted, and higher level activities (for instance he got step forward with your left foot incorrect two times in a row on two consecutie stands), not aware of his lateral lean at times.       General Comments General comments (skin integrity, edema, etc.): O2  sats on RA dropped in staning to 87% x1, but came back up in sitting to 90-93%.  O2 [place] re-applied for support once transferred to chair.         Assessment/Plan    PT Assessment Patient needs continued PT services  PT Problem List Decreased strength;Decreased activity tolerance;Decreased balance;Decreased mobility;Decreased coordination;Decreased cognition;Decreased knowledge of use of DME;Decreased safety awareness;Decreased knowledge of precautions;Impaired sensation;Pain;Impaired tone       PT Treatment Interventions DME instruction;Gait training;Stair training;Functional mobility training;Therapeutic activities;Therapeutic exercise;Balance training;Neuromuscular re-education;Cognitive remediation;Patient/family education    PT Goals (Current goals can be found in the Care Plan section)  Acute Rehab PT Goals Patient Stated Goal: to work hard and rehab hard so he can get better PT Goal Formulation: With patient/family Time For Goal Achievement: 09/16/17 Potential to Achieve Goals: Good    Frequency Min 4X/week           AM-PAC PT "6 Clicks" Daily Activity  Outcome Measure Difficulty turning over in bed (including adjusting bedclothes, sheets and blankets)?: Unable Difficulty moving from lying on back to sitting on the side of the bed? : Unable Difficulty sitting down on and standing up from a chair with arms (e.g., wheelchair, bedside commode, etc,.)?: Unable Help needed moving to and from a bed to chair (including a wheelchair)?: A Lot Help needed walking in hospital room?: Total Help needed climbing 3-5 steps with a railing? : Total  6 Click Score: 7    End of Session Equipment Utilized During Treatment: Gait belt Activity Tolerance: Patient tolerated treatment well Patient left: in chair;with call bell/phone within reach;with family/visitor present Nurse Communication: Mobility status PT Visit Diagnosis: Other symptoms and signs involving the nervous system  (R29.898);Difficulty in walking, not elsewhere classified (R26.2);Hemiplegia and hemiparesis Hemiplegia - Right/Left: Right Hemiplegia - dominant/non-dominant: Dominant Hemiplegia - caused by: Nontraumatic intracerebral hemorrhage;Nontraumatic [facility]    Time: 1610-9604 PT Time Calculation (min) (ACUTE ONLY): 37 min   Charges:         Lurena Joiner B. Franchot Pollitt, PT, DPT [phone]   PT Evaluation $PT Eval Moderate Complexity: 1 Mod     09/02/2017, 11:55 AM

## 2017-09-02 NOTE — Progress Notes (Signed)
Rehab Admissions Coordinator Note:  Patient was screened by Trish Mage for appropriateness for an Inpatient Acute Rehab Consult.  At this time, we are recommending Inpatient Rehab consult.  Trish Mage 09/02/2017, 12:09 PM  I can be reached at [phone].

## 2017-09-02 NOTE — Progress Notes (Signed)
PT Cancellation Note  Patient Details Name: Jaime Pierce MRN: [id number] DOB: [birth year]   Cancelled Treatment:    Reason Eval/Treat Not Completed: Patient not medically ready. Pt currently on bedrest. Will await increased activity orders prior to initiating PT eval.    Marylynn Pearson 09/02/2017, 6:58 AM   Conni Slipper, PT, DPT Acute Rehabilitation Services Pager: [phone]

## 2017-09-02 NOTE — Consult Note (Signed)
Physical Medicine and Rehabilitation Consult Reason for Consult:  Right side weakness Referring Physician:   Dr.Xu   HPI: Jaime Pierce is a 71 y.o.right handed male with history of CAD with CABG, hypertension, tobacco abuse.  Per chart review patient lives with spouse. Independent and active. Wife is also retired. One level home 2 steps to entry.  Presented 09/01/2017 with acute onset of right sided weakness and facial droop. Blood pressure 170/90.  CT/MRI showed a 4.2 x 2.5 x 3.1 cm left parietal lobe intraparenchymal hematoma. Small-volume acute subarachnoid hemorrhage was in the adjacent left parietal lobe as well as at the anterior right frontal lobe. CT angiogram head and neck with no evidence of aneurysm or vascular malformation. Follow-up neurology services with conservative care.  Echocardiogram with ejection fraction of 55% no wall motion abnormalities. Follow-up cranial CT scan 09/02/2017 showed a mild interval increase in size of left parietal lobe intraparenchymal hematoma measuring 4.7 x 5.4 x 4.9 cm. No other acute intracranial abnormality. Tolerating a regular consistency diet. Physical therapy evaluation completed with recommendations of physical medicine rehabilitation consult.  Patient retired but active prior to admission. Wife and daughter at bedside.   Review of Systems  Constitutional: Negative for chills and fever.  HENT: Negative for hearing loss.   Eyes: Negative for blurred vision and double vision.  Respiratory: Positive for shortness of breath. Negative for cough.   Cardiovascular: Negative for chest pain, palpitations and leg swelling.  Gastrointestinal: Positive for constipation. Negative for nausea and vomiting.  Genitourinary: Negative for dysuria, flank pain and hematuria.  Musculoskeletal: Positive for joint pain and myalgias.  Skin: Negative for rash.  Neurological: Positive for weakness. Negative for seizures.  All other systems reviewed and are  negative.  Past Medical History:  Diagnosis Date  . Coronary artery disease   . Dyspnea on exertion   . Hyperlipidemia   . Hypertension   . Tobacco abuse    Past Surgical History:  Procedure Laterality Date  . CORONARY ARTERY BYPASS GRAFT     Family History  Problem Relation Age of Onset  . Heart disease Father   . CVA Mother   . Heart disease Maternal Grandfather    Social History:  reports that he has been smoking Cigarettes.  He has a 25.00 pack-year smoking history. He has never used smokeless tobacco. He reports that he drinks alcohol. He reports that he does not use drugs. Allergies:  Allergies  Allergen Reactions  . Sulfonamide Derivatives    Medications Prior to Admission  Medication Sig Dispense Refill  . albuterol (PROAIR HFA) 108 (90 Base) MCG/ACT inhaler 2 puffs up to every 4 hours if can't catch your breath (Patient taking differently: Inhale 1-2 puffs into the lungs every 4 (four) hours as needed for wheezing or shortness of breath. ) 1 Inhaler 11  . atorvastatin (LIPITOR) 40 MG tablet Take 1 tablet (40 mg total) by mouth daily. 90 tablet 3  . budesonide-formoterol (SYMBICORT) 160-4.5 MCG/ACT inhaler Take 2 puffs first thing in am and then another 2 puffs about 12 hours later. (Patient taking differently: Inhale 2 puffs into the lungs 2 (two) times daily. ) 1 Inhaler 0  . cetirizine (ZYRTEC) 10 MG tablet Take 10 mg by mouth daily.    . irbesartan (AVAPRO) 150 MG tablet Take 1 tablet (150 mg total) by mouth daily. 90 tablet 3  . metoprolol (LOPRESSOR) 50 MG tablet Take 0.5 tablets (25 mg total) by mouth 2 (two) times daily.  90 tablet 3  . nitroGLYCERIN (NITROSTAT) 0.4 MG SL tablet Place 1 tablet (0.4 mg total) under the tongue every 5 (five) minutes as needed for chest pain. 20 tablet 3    Home: Home Living Family/patient expects to be discharged to:: Private residence Living Arrangements: Spouse/significant other Available Help at Discharge: Family, Available 24  hours/day (wife is also retired) Type of Home: House Home Access: Stairs to enter Secretary/administrator of Steps: 2 Entrance Stairs-Rails: None Home Layout: One level Bathroom Shower/Tub: Health visitor: Standard Home Equipment: Information systems manager - built in Additional Comments: liked to play golf  Lives With: Spouse  Functional History: Prior Function Level of Independence: Independent Comments: still drives, retired Psychologist, clinical Status:  Mobility: Bed Mobility Overal bed mobility: Needs Assistance Bed Mobility: Supine to Sit Supine to sit: +2 for physical assistance, Mod assist, HOB elevated General bed mobility comments: Two person mod assist to help right leg mostly and trunk for balance during transitions.  HOB elevated to ~35 degrees Transfers Overall transfer level: Needs assistance Equipment used: 2 person hand held assist Transfers: Sit to/from Stand, Stand Pivot Transfers Sit to Stand: +2 physical assistance, Mod assist Stand pivot transfers: +2 physical assistance, Mod assist General transfer comment: Two person heavy mod assist to stand EOB with right knee blocked (it does buckle when block loosened).  Assist needed to weight shift left and ID that he needs to weight shift left in standing.  Heavy mod assist to pivot on his left stronger foot while blocking and helping to progress his right foot around to the chair.  support at trunk and continued right lateral lean statically and more so dynamically  Ambulation/Gait General Gait Details: not ready to attempt today.     ADL:    Cognition: Cognition Overall Cognitive Status: Impaired/Different from baseline Arousal/Alertness: Awake/alert Orientation Level: Oriented X4 Attention: Sustained Sustained Attention: Appears intact Memory: Impaired Memory Impairment: Retrieval deficit Awareness: Appears intact Problem Solving: Appears intact Executive Function: Reasoning, Initiating, Self Correcting, Self  Monitoring Reasoning: Appears intact Initiating: Appears intact Self Monitoring: Appears intact Self Correcting: Appears intact Safety/Judgment: Appears intact Cognition Arousal/Alertness: Awake/alert Behavior During Therapy:  (tearful, appropriately so) Overall Cognitive Status: Impaired/Different from baseline Area of Impairment: Attention, Following commands, Safety/judgement, Awareness, Problem solving Current Attention Level: Selective Following Commands: Follows one step commands consistently, Follows multi-step commands inconsistently Safety/Judgement: Decreased awareness of deficits Awareness: Emergent Problem Solving: Difficulty sequencing, Requires verbal cues, Requires tactile cues General Comments: Some mild cognitive deficits related to awareness noted, and higher level activities (for instance he got step forward with your left foot incorrect two times in a row on two consecutie stands), not aware of his lateral lean at times.   Blood pressure 113/74, pulse 66, temperature 97.8 F (36.6 C), temperature source Oral, resp. rate 20, height 6' (1.829 m), weight 85.1 kg (187 lb 9.8 oz), SpO2 95 %. Physical Exam  Vitals reviewed. Constitutional: He is oriented to person, place, and time.  HENT:  Mild right facial droop  Eyes: EOM are normal.  Neck: Normal range of motion. Neck supple. No thyromegaly present.  Cardiovascular: Normal rate, regular rhythm and normal heart sounds.   Respiratory: Effort normal and breath sounds normal. No respiratory distress.  GI: Soft. Bowel sounds are normal. He exhibits no distension.  Neurological: He is alert and oriented to person, place, and time.  Patient would easily become tearful during exam. He followed commands. Fair awareness of deficits.  Skin: Skin is warm and dry.  evidence of ipsilateral motor apraxia, left upper extremity. This limits  Manual muscle testing, grossly 5/5 in the left deltoid, biceps, triceps, grip, hip flexor,  knee extensor, wrist flexor Right upper extremity 0/5 in the deltoid, biceps, triceps, finger flexors and extensors, trace hip, knee extensor symmetry right lower extremity. Sensation absent to light touch in the right upper and right lower limb. He has intact pinch sensation in the right toes, but absent at the right fingers. Intact sensation on the left side Oriented to person, place, year, month, but not day and date. visual fields are intact confrontation testing. No evidence of right neglect Results for orders placed or performed during the hospital encounter of 09/01/17 (from the past 24 hour(s))  CBC     Status: Abnormal   Collection Time: 09/02/17  3:14 AM  Result Value Ref Range   WBC 12.1 (H) 4.0 - 10.5 K/uL   RBC 5.01 4.22 - 5.81 MIL/uL   Hemoglobin 15.7 13.0 - 17.0 g/dL   HCT 16.1 09.6 - 04.5 %   MCV 92.8 78.0 - 100.0 fL   MCH 31.3 26.0 - 34.0 pg   MCHC 33.8 30.0 - 36.0 g/dL   RDW 40.9 81.1 - 91.4 %   Platelets 210 150 - 400 K/uL  Basic metabolic panel     Status: None   Collection Time: 09/02/17  3:14 AM  Result Value Ref Range   Sodium 139 135 - 145 mmol/L   Potassium 4.1 3.5 - 5.1 mmol/L   Chloride 107 101 - 111 mmol/L   CO2 23 22 - 32 mmol/L   Glucose, Bld 99 65 - 99 mg/dL   BUN 13 6 - 20 mg/dL   Creatinine, Ser 7.82 0.61 - 1.24 mg/dL   Calcium 9.0 8.9 - 95.6 mg/dL   GFR calc non Af Amer >60 >60 mL/min   GFR calc Af Amer >60 >60 mL/min   Anion gap 9 5 - 15  Lipid panel     Status: Abnormal   Collection Time: 09/02/17  3:14 AM  Result Value Ref Range   Cholesterol 131 0 - 200 mg/dL   Triglycerides 73 <213 mg/dL   HDL 35 (L) >08 mg/dL   Total CHOL/HDL Ratio 3.7 RATIO   VLDL 15 0 - 40 mg/dL   LDL Cholesterol 81 0 - 99 mg/dL  Hemoglobin M5H     Status: Abnormal   Collection Time: 09/02/17  3:14 AM  Result Value Ref Range   Hgb A1c MFr Bld 5.9 (H) 4.8 - 5.6 %   Mean Plasma Glucose 122.63 mg/dL  TSH     Status: None   Collection Time: 09/02/17  3:14 AM    Result Value Ref Range   TSH 1.753 0.350 - 4.500 uIU/mL  Vitamin B12     Status: None   Collection Time: 09/02/17  3:14 AM  Result Value Ref Range   Vitamin B-12 198 180 - 914 pg/mL   Ct Angio Head W Or Wo Contrast  Result Date: 09/01/2017 CLINICAL DATA:  Right-sided weakness.  Left parietal hemorrhage. EXAM: CT ANGIOGRAPHY HEAD AND NECK TECHNIQUE: Multidetector CT imaging of the head and neck was performed using the standard protocol during bolus administration of intravenous contrast. Multiplanar CT image reconstructions and MIPs were obtained to evaluate the vascular anatomy. Carotid stenosis measurements (when applicable) are obtained utilizing NASCET criteria, using the distal internal carotid diameter as the denominator. CONTRAST:  50 mL Isovue 370 COMPARISON:  Brain MRI and CT 09/01/2017. No prior  angiographic imaging. FINDINGS: CTA NECK FINDINGS Aortic arch: Standard 3 vessel aortic arch with mild atherosclerotic plaque. Predominantly calcified plaque at the left subclavian artery origin without stenosis. Right carotid system: Patent without evidence of stenosis or dissection. Mild calcified plaque about the carotid bifurcation. Left carotid system: Patent without evidence of stenosis or dissection. Mild calcified plaque about the carotid bifurcation and at the common carotid origin. Vertebral arteries: Patent with the left being mildly dominant. Mild luminal irregularity bilaterally without definite stenosis. Streak artifact limits assessment of the right vertebral artery at the dura. Skeleton: Advanced cervical disc degeneration. Interbody osseous fusion at C5-6. Severe right facet arthrosis at C4-5. Other neck: No mass or lymph node enlargement. Upper chest: Moderate centrilobular emphysema. Review of the MIP images confirms the above findings CTA HEAD FINDINGS Anterior circulation: The internal carotid arteries are patent from skullbase to carotid termini. There is mild siphon atherosclerosis  bilaterally without significant stenosis. The ACAs and MCAs are patent without evidence of proximal branch occlusion or significant proximal stenosis. No aneurysm. No evidence of vascular malformation to account for the left parietal hemorrhage. Posterior circulation: The intracranial vertebral arteries are widely patent to the basilar. Grossly patent AICAs and SCAs bilaterally. The basilar artery is patent with minimal narrowing at the level of the AICAs. There is a patent right posterior communicating artery. The PCAs are patent without evidence of significant proximal stenosis. No aneurysm. Venous sinuses: Patent. Anatomic variants: None. Delayed phase: 5.3 x 3.7 cm hematoma in the left parietal lobe, mildly enlarged in the interim. Mild surrounding edema with local mass effect but no midline shift. Scattered small volume subarachnoid hemorrhage bilaterally. No definite abnormal enhancement. Review of the MIP images confirms the above findings IMPRESSION: 1. Mild intracranial and cervical atherosclerosis without large vessel occlusion or significant proximal stenosis. 2. No evidence of cerebral aneurysm or vascular malformation. 3. Mild interval enlargement of the left parietal hematoma. No midline shift. 4. Scattered small volume subarachnoid hemorrhage. Electronically Signed   By: Sebastian Ache M.D.   On: 09/01/2017 14:10   Ct Head Wo Contrast  Result Date: 09/02/2017 CLINICAL DATA:  Follow-up examination for intracranial hemorrhage. EXAM: CT HEAD WITHOUT CONTRAST TECHNIQUE: Contiguous axial images were obtained from the base of the skull through the vertex without intravenous contrast. COMPARISON:  P[facility] CT from 09/01/2017. FINDINGS: Brain: Right parietal intraparenchymal hematoma again seen, measuring 4.7 x 5.4 x 4.9 cm (estimated volume 62 cc). This is increased in size relative to prior exam. Localized vasogenic edema with regional mass effect also increased, with mass effect mass seen on the posterior  left lateral ventricle. Adjacent small volume subarachnoid hemorrhage within the left parietal temporal region again noted. Right frontal subarachnoid hemorrhage is less conspicuous as compared to prior, now only faintly visible. No other new acute intracranial hemorrhage. No acute large vessel territory infarct. Underlying atrophy with chronic small vessel ischemic disease again noted. No midline shift. No hydrocephalus. Basilar cisterns remain patent. No extra-axial fluid collection. Vascular: No hyperdense vessel. Scattered vascular calcifications noted within the carotid siphons. Skull: Scalp soft tissues and calvarium within normal limits. Sinuses/Orbits: Globes and orbital soft tissues are unremarkable. Retention cyst partially visualize within the left maxillary sinus. Mild right maxillary and left sphenoid mucosal thickening. Paranasal sinuses otherwise clear. Trace left mastoid effusion again noted. Other: None. IMPRESSION: 1. Interval increase in size of left parietal lobe intraparenchymal hematoma, now measuring 4.7 x 5.4 x 4.9 cm (estimated volume 62 mL, previously 4.2 x 2.5 x 3.1 cm -  16 mL). Increased localized vasogenic edema and regional mass effect without midline shift. 2. Persistent small volume acute subarachnoid hemorrhage within the left parietotemporal region and right frontal lobe. 3. No other new acute intracranial abnormality. Electronically Signed   By: Rise Mu M.D.   On: 09/02/2017 07:19   Ct Angio Neck W Or Wo Contrast  Result Date: 09/01/2017 CLINICAL DATA:  Right-sided weakness.  Left parietal hemorrhage. EXAM: CT ANGIOGRAPHY HEAD AND NECK TECHNIQUE: Multidetector CT imaging of the head and neck was performed using the standard protocol during bolus administration of intravenous contrast. Multiplanar CT image reconstructions and MIPs were obtained to evaluate the vascular anatomy. Carotid stenosis measurements (when applicable) are obtained utilizing NASCET criteria,  using the distal internal carotid diameter as the denominator. CONTRAST:  50 mL Isovue 370 COMPARISON:  Brain MRI and CT 09/01/2017. No prior angiographic imaging. FINDINGS: CTA NECK FINDINGS Aortic arch: Standard 3 vessel aortic arch with mild atherosclerotic plaque. Predominantly calcified plaque at the left subclavian artery origin without stenosis. Right carotid system: Patent without evidence of stenosis or dissection. Mild calcified plaque about the carotid bifurcation. Left carotid system: Patent without evidence of stenosis or dissection. Mild calcified plaque about the carotid bifurcation and at the common carotid origin. Vertebral arteries: Patent with the left being mildly dominant. Mild luminal irregularity bilaterally without definite stenosis. Streak artifact limits assessment of the right vertebral artery at the dura. Skeleton: Advanced cervical disc degeneration. Interbody osseous fusion at C5-6. Severe right facet arthrosis at C4-5. Other neck: No mass or lymph node enlargement. Upper chest: Moderate centrilobular emphysema. Review of the MIP images confirms the above findings CTA HEAD FINDINGS Anterior circulation: The internal carotid arteries are patent from skullbase to carotid termini. There is mild siphon atherosclerosis bilaterally without significant stenosis. The ACAs and MCAs are patent without evidence of proximal branch occlusion or significant proximal stenosis. No aneurysm. No evidence of vascular malformation to account for the left parietal hemorrhage. Posterior circulation: The intracranial vertebral arteries are widely patent to the basilar. Grossly patent AICAs and SCAs bilaterally. The basilar artery is patent with minimal narrowing at the level of the AICAs. There is a patent right posterior communicating artery. The PCAs are patent without evidence of significant proximal stenosis. No aneurysm. Venous sinuses: Patent. Anatomic variants: None. Delayed phase: 5.3 x 3.7 cm  hematoma in the left parietal lobe, mildly enlarged in the interim. Mild surrounding edema with local mass effect but no midline shift. Scattered small volume subarachnoid hemorrhage bilaterally. No definite abnormal enhancement. Review of the MIP images confirms the above findings IMPRESSION: 1. Mild intracranial and cervical atherosclerosis without large vessel occlusion or significant proximal stenosis. 2. No evidence of cerebral aneurysm or vascular malformation. 3. Mild interval enlargement of the left parietal hematoma. No midline shift. 4. Scattered small volume subarachnoid hemorrhage. Electronically Signed   By: Sebastian Ache M.D.   On: 09/01/2017 14:10   [name] BJ Contrast  Result Date: 09/01/2017 CLINICAL DATA:  Initial evaluation for acute intracranial hemorrhage. EXAM: MRI HEAD WITHOUT AND WITH CONTRAST TECHNIQUE: Multiplanar, multiecho pulse sequences of the brain and surrounding structures were obtained without and with intravenous contrast. CONTRAST:  15mL MULTIHANCE GADOBENATE DIMEGLUMINE 529 MG/ML IV SOLN COMPARISON:  [facility] CT from earlier the same day. FINDINGS: Brain: Diffuse prominence of the CSF containing spaces compatible with generalized cerebral atrophy. Patchy and confluent T2/FLAIR hyperintensity within the periventricular and deep white matter both cerebral hemispheres most consistent with chronic microvascular ischemic disease, overall moderate  nature. Previously identified acute intraparenchymal hematoma positioned at the left parietal lobe again seen. This measures 2.7 x 4.8 x 3.9 cm, similar in appearance to previous. Localized vasogenic edema without significant regional mass effect. No appreciable underlying mass lesion identified. Patchy curvilinear enhancement along the posterior margin of the hematoma on post gadolinium sequence favored to be reactive and/or related to vascular compression. Additional multifocal areas of acute subarachnoid hemorrhage seen within mean  posterior left frontal region as well as the left temporal lobe. Small volume subarachnoid hemorrhage also seen within the anterior right frontal lobe. Scattered subarachnoid hemorrhage also present within the right parietal lobe as well. No other evidence for acute or chronic intracranial hemorrhage. No findings to suggest amyloid angiography. No intraventricular hemorrhage. No other evidence for acute infarct. No other mass lesion. No midline shift. Ventricles normal size without evidence for hydrocephalus. No extra-axial fluid collection. No other abnormal enhancement. Pituitary gland within normal limits. Midline structures intact and normal. Vascular: Major intracranial vascular flow voids are maintained. Skull and upper cervical spine: Craniocervical junction within normal limits. Visualized upper cervical spine unremarkable. Bone marrow signal intensity within normal limits. No scalp soft tissue abnormality. Sinuses/Orbits: Globes and orbital soft tissues within normal limits. Retention cysts noted within the left maxillary sinus. Paranasal sinuses otherwise clear. Small left mastoid effusion noted. Inner ear structures normal. 14 mm T2 hypointense lesion noted within the left parotid gland (series 11, image 14). Other: None. IMPRESSION: 1. Similar appearance of left parietal intraparenchymal hematoma measuring 2.7 x 4.8 x 3.9 cm. Localized vasogenic edema without significant regional mass effect. No underlying mass lesion or other abnormality. 2. Small volume acute subarachnoid hemorrhage involving the bilateral cerebral hemispheres as above. 3. Moderate cerebral atrophy with chronic small vessel ischemic disease. 4. 14 mm lesion positioned within the left parotid gland, indeterminate. Follow-up examination with dedicated neck CT with contrast suggested for further evaluation on a nonemergent basis. Electronically Signed   By: Rise Mu M.D.   On: 09/01/2017 06:40   Ct Head Code Stroke Wo  Contrast  Result Date: 09/01/2017 CLINICAL DATA:  Code stroke. Initial evaluation for acute right-sided weakness. EXAM: CT HEAD WITHOUT CONTRAST TECHNIQUE: Contiguous axial images were obtained from the base of the skull through the vertex without intravenous contrast. COMPARISON:  None. FINDINGS: Brain: There is an acute intraparenchymal hematoma centered at the left parietal convexity measuring 4.2 x 2.5 x 3.1 cm (Estimated volume 16 cc). Mild localized edema without significant regional mass effect. Adjacent scattered small volume subarachnoid hemorrhage. Additional small volume subarachnoid overlies the right cerebral convexity as well (Series 3, image 21). No other acute intracranial hemorrhage. No acute large vessel territory infarct. Moderate cerebral atrophy with chronic small vessel ischemic disease. No mass lesion or midline shift. No hydrocephalus. No extra-axial fluid collection. Vascular: No asymmetric hyperdense vessel. Scattered vascular calcifications noted within the carotid siphons. Skull: Scalp soft tissues and calvarium within normal limits. Sinuses/Orbits: Globes and orbital soft tissues within normal limits. Retention cyst present within the left maxillary sinus. Paranasal sinuses otherwise largely clear. Trace left mastoid effusion noted. Right mastoid air cells clear. Other: None. IMPRESSION: 1. 4.2 x 2.5 x 3.1 cm left parietal lobe intraparenchymal hematoma (estimated volume 16 cc). Mild localized vasogenic edema without significant regional mass effect. 2. Small volume acute subarachnoid hemorrhage within the adjacent left parietal lobe as well as at the anterior right frontal lobe. 3. Moderate cerebral atrophy with chronic small vessel ischemic disease. Critical Value/emergent results were called by telephone at  the time of interpretation on 09/01/2017 at 3:21 am to Dr. Otelia Limes , who verbally acknowledged these results. Electronically Signed   By: Rise Mu M.D.   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 Does the need for close, 24 hr/day medical supervision in concert with the patient's rehab needs make it unreasonable for this patient to be served in a less intensive setting? Yes 2. Co-Morbidities requiring supervision/potential complications: history of hypertension, coronary artery disease status post CABG 3. Due to bladder management, bowel management, safety, skin/wound care, disease management, medication administration, pain management and patient education, does the patient require 24 hr/day rehab nursing? Yes 4. Does the patient require coordinated care of a physician, rehab nurse, PT (1-2 hrs/day, 5 days/week), OT (1-2 hrs/day, 5 days/week) and SLP (.5-1 hrs/day, 5 days/week) to address physical and functional deficits in the context of the above medical diagnosis(es)? PT (1-2 hrs/day, 5 days/week), OT (1-2 hrs/day, 5 days/week) and SLP (.5-1 hrs/day, 5 days/week) Addressing deficits in the following areas: balance, endurance, locomotion, strength, transferring, bowel/bladder control, bathing, dressing, feeding, grooming, toileting, cognition, speech, language and psychosocial support 5. Can the patient actively participate in an intensive therapy program of at least 3 hrs of therapy per day at least 5 days per week? Yes 6. The potential for patient to make measurable gains while on inpatient rehab is good 7. Anticipated functional outcomes upon discharge from inpatient rehab are supervision and min assist  with PT, supervision and min assist with OT, supervision with SLP. 8. Estimated rehab length of stay to reach the above functional goals is: 19-22d 9. Anticipated D/C setting: Home 10. Anticipated post D/C treatments: HH therapy 11. Overall Rehab/Functional Prognosis: excellent  RECOMMENDATIONS: This patient's condition is appropriate for  continued rehabilitative care in the following setting: CIR Patient has agreed to participate in recommended program. Yes Note that insurance prior authorization may be required for reimbursement for recommended care.  Comment: discussed rehabilitation process and overall timeframe as well as progression through rehabilitation continuum with pt and family  Erick Colace M.D. [facility] FAAPM&R (Sports Med, Neuromuscular Med) Diplomate Am Board of Electrodiagnostic Med  Charlton Amor., PA-C 09/02/2017

## 2017-09-03 ENCOUNTER — Encounter: Payer: Self-pay | Admitting: Internal Medicine

## 2017-09-03 LAB — CBC
HEMATOCRIT: 45.5 % (ref 39.0–52.0)
HEMOGLOBIN: 15.5 g/dL (ref 13.0–17.0)
MCH: 31.4 pg (ref 26.0–34.0)
MCHC: 34.1 g/dL (ref 30.0–36.0)
MCV: 92.1 fL (ref 78.0–100.0)
Platelets: 210 10*3/uL (ref 150–400)
RBC: 4.94 MIL/uL (ref 4.22–5.81)
RDW: 14.2 % (ref 11.5–15.5)
WBC: 12.6 10*3/uL — ABNORMAL HIGH (ref 4.0–10.5)

## 2017-09-03 LAB — BASIC METABOLIC PANEL
Anion gap: 9 (ref 5–15)
BUN: 16 mg/dL (ref 6–20)
CALCIUM: 8.8 mg/dL — AB (ref 8.9–10.3)
CHLORIDE: 107 mmol/L (ref 101–111)
CO2: 22 mmol/L (ref 22–32)
CREATININE: 0.91 mg/dL (ref 0.61–1.24)
GFR calc Af Amer: >60 mL/min (ref >60)
GFR calc non Af Amer: >60 mL/min (ref >60)
GLUCOSE: 112 mg/dL — AB (ref 65–99)
Potassium: 3.8 mmol/L (ref 3.5–5.1)
Sodium: 138 mmol/L (ref 135–145)

## 2017-09-03 MED ORDER — LABETALOL HCL 5 MG/ML IV SOLN: 5.0000 mg | INTRAVENOUS | Status: DC | PRN

## 2017-09-03 NOTE — Progress Notes (Signed)
Pt arrived to 5C20 via bed.  Pt alert and oriented, in no pain.  VSS.  Telemetry applied and CCMD notified.  Will continue to monitor.  Sondra Come, RN

## 2017-09-03 NOTE — Progress Notes (Signed)
Physical Therapy Treatment Patient Details Name: Jaime Pierce MRN: [id number] DOB: 1945-12-05 Today's Date: 09/03/2017    History of Present Illness 71 y.o. male admitted on 09/01/17 for R sided weakness with CT revealing a L parietal lobe intraparenchymal hematoma, small volume SAH in adjacent L parietal lobe as well as R frontal lobe.  Pt with significant PMH of HTN, DOE, CAD, and CABG.      PT Comments    Pt did better physically taking more pivotal steps and eventually better midline sitting and standing balance with cues and facilitation.  He continues to have mild cognitive deficits that come out when he attempts functional activities that he may benefit for SLP at the inpatient rehab level (despite acute performance and sign off).  Pt continues to be tearful at the end of the session and daughter present for our session.    Follow Up Recommendations  CIR     Equipment Recommendations  3in1 (PT);Rolling walker with 5" wheels;Other (comment) (right platform RW)    Recommendations for Other Services Rehab consult     Precautions / Restrictions Precautions Precautions: Fall Precaution Comments: right sided weakness Restrictions Weight Bearing Restrictions: No    Mobility  Bed Mobility Overal bed mobility: Needs Assistance Bed Mobility: Supine to Sit     Supine to sit: +2 for physical assistance;Mod assist;HOB elevated     General bed mobility comments: Two person mod assist to sit up EOB to support trunk, give cues for sequencing and help move legs over to side of the bed.    Transfers Overall transfer level: Needs assistance Equipment used: 2 person hand held assist Transfers: Sit to/from [employer] Sit to Stand: +2 physical assistance;Mod assist Stand pivot transfers: +2 physical assistance;Mod assist       General transfer comment: Two person heavy mod assist to stand from bed with trunk supported and right leg blocked.  Verbal cues to  weight shift to the left, upright posture and sequencing of feet during transfer to chair.    Ambulation/Gait             General Gait Details: Pt is not quite ready for gait yet.        Modified Rankin (Stroke Patients Only) Modified Rankin (Stroke Patients Only) Pre-Morbid Rankin Score: No symptoms Modified Rankin: Severe disability     Balance Overall balance assessment: Needs assistance Sitting-balance support: Feet supported;Single extremity supported Sitting balance-Leahy Scale: Poor Sitting balance - Comments: up to mod assist EOB and as good as supervision (close) once feet positioned and hands positioned in lap.  Initially he had a strong anterior/right lateral lean.  Visual target used to find midline and hold it there.  Pt unable to verbalize which way he was leaning.  Postural control: Right lateral lean;Other (comment) (anterior) Standing balance support: Bilateral upper extremity supported Standing balance-Leahy Scale: Poor Standing balance comment: Two person mod assist with right knee blocked.  Verbal cues to weight shift left, stand up tall with chest and head up.  Pt with continued decreased awareness that he is leaning to the right.                             Cognition Arousal/Alertness: Awake/alert Behavior During Therapy:  (tearful) Overall Cognitive Status: Impaired/Different from baseline Area of Impairment: Attention;Following commands;Memory;Safety/judgement;Awareness;Problem solving                   Current Attention Level: Selective  Memory: Decreased short-term memory Following Commands: Follows one step commands with increased time Safety/Judgement: Decreased awareness of deficits;Decreased awareness of safety Awareness: Emergent Problem Solving: Decreased initiation;Slow processing;Difficulty sequencing;Requires verbal cues;Requires tactile cues General Comments: Pt with continued display of cognitive deficits functionally  more so than in general conversation, difficulty sequencing, needs frequent (and similar) verbal cues, pt also when asked thinks that he could get back to bed on his own unassisted and it took two person heavy assist to get up.        Exercises General Exercises - Upper Extremity Shoulder Flexion: PROM;Right;5 reps Elbow Flexion: PROM;Right;5 reps Elbow Extension: PROM;Right;5 reps General Exercises - Lower Extremity Ankle Circles/Pumps: PROM;Right;5 reps Heel Slides: PROM;Right;5 reps    General Comments General comments (skin integrity, edema, etc.): Pt with increased flexion tone throghout his right side today, PROM preformed with good results.        Pertinent Vitals/Pain Pain Assessment: Faces Faces Pain Scale: Hurts little more Pain Location: right leg Pain Descriptors / Indicators: Burning;Cramping Pain Intervention(s): Limited activity within patient's tolerance;Monitored during session;Repositioned           PT Goals (current goals can now be found in the care plan section) Acute Rehab PT Goals Patient Stated Goal: to work hard and rehab hard so he can get better Progress towards PT goals: Progressing toward goals    Frequency    Min 4X/week      PT Plan Current plan remains appropriate       AM-PAC PT "6 Clicks" Daily Activity  Outcome Measure  Difficulty turning over in bed (including adjusting bedclothes, sheets and blankets)?: Unable Difficulty moving from lying on back to sitting on the side of the bed? : Unable Difficulty sitting down on and standing up from a chair with arms (e.g., wheelchair, bedside commode, etc,.)?: Unable Help needed moving to and from a bed to chair (including a wheelchair)?: A Lot Help needed walking in hospital room?: Total Help needed climbing 3-5 steps with a railing? : Total 6 Click Score: 7    End of Session Equipment Utilized During Treatment: Gait belt Activity Tolerance: Patient tolerated treatment well Patient  left: in chair;with call bell/phone within reach;with chair alarm set;with family/visitor present Nurse Communication: Mobility status;Need for lift equipment;Other (comment) (safer to use the steady to get him back to bed. ) PT Visit Diagnosis: Other symptoms and signs involving the nervous system (R29.898);Difficulty in walking, not elsewhere classified (R26.2);Hemiplegia and hemiparesis Hemiplegia - Right/Left: Right Hemiplegia - dominant/non-dominant: Dominant Hemiplegia - caused by: Nontraumatic intracerebral hemorrhage;[facility]     Time: 7829-5621 PT Time Calculation (min) (ACUTE ONLY): 34 min  Charges:  $Therapeutic Activity: 8-22 mins $Neuromuscular Re-education: 8-22 mins          Delisa Finck B. Yorley Buch, PT, DPT [phone]            09/03/2017, 5:23 PM

## 2017-09-03 NOTE — Progress Notes (Addendum)
STROKE TEAM PROGRESS NOTE   SUBJECTIVE (INTERVAL HISTORY) His wife is at the bedside.  He is awake and alert, sitting in bed for breakfast. RUE bicep 2/5 today. PT/OT recommend CIR.   OBJECTIVE Temp:  [97.6 F (36.4 C)-98.4 F (36.9 C)] 97.7 F (36.5 C) (10/12 0800) Pulse Rate:  [63-87] 74 (10/12 0830) Cardiac Rhythm: Normal sinus rhythm (10/11 2000) Resp:  [13-25] 24 (10/12 0830) BP: (90-139)/(52-100) 130/70 (10/12 0830) SpO2:  [90 %-96 %] 95 % (10/12 0830)  CBC:   Recent Labs Lab 09/01/17 0250  09/02/17 0314 09/03/17 0323  WBC 8.8  --  12.1* 12.6*  NEUTROABS 4.2  --   --   --   HGB 15.9  < > 15.7 15.5  HCT 46.3  < > 46.5 45.5  MCV 92.4  --  92.8 92.1  PLT 199  --  210 210  < > = values in this interval not displayed.  Basic Metabolic Panel:   Recent Labs Lab 09/02/17 0314 09/03/17 0323  NA 139 138  K 4.1 3.8  CL 107 107  CO2 23 22  GLUCOSE 99 112*  BUN 13 16  CREATININE 0.96 0.91  CALCIUM 9.0 8.8*    Lipid Panel:     Component Value Date/Time   CHOL 131 09/02/2017 0314   TRIG 73 09/02/2017 0314   TRIG 65 09/21/2006 0832   HDL 35 (L) 09/02/2017 0314   CHOLHDL 3.7 09/02/2017 0314   VLDL 15 09/02/2017 0314   LDLCALC 81 09/02/2017 0314   HgbA1c:  Lab Results  Component Value Date   HGBA1C 5.9 (H) 09/02/2017   Urine Drug Screen: No results found for: LABOPIA, COCAINSCRNUR, LABBENZ, AMPHETMU, THCU, LABBARB  Alcohol Level     Component Value Date/Time   ETH <10 09/01/2017 0250    IMAGING I have personally reviewed the radiological images below and agree with the radiology interpretations.  Ct Head 09/02/2017 1. Interval increase in size of left parietal lobe intraparenchymal hematoma, now measuring 4.7 x 5.4 x 4.9 cm (estimated volume 62 mL, previously 4.2 x 2.5 x 3.1 cm - 16 mL). Increased localized vasogenic edema and regional mass effect without midline shift. 2. Persistent small volume acute subarachnoid hemorrhage within the left  parietotemporal region and right frontal lobe. 3. No other new acute intracranial abnormality.  09/01/2017 4.2 x 2.5 x 3.1 cm left parietal lobe intraparenchymal hematoma (estimated volume 16 cc). Mild localized vasogenic edema without significant regional mass effect. 2. Small volume acute subarachnoid hemorrhage within the adjacent left parietal lobe as well as at the anterior right frontal lobe. 3. Moderate cerebral atrophy with chronic small vessel ischemic disease.  Ct Angio Head W Or Wo Contrast Ct Angio Neck W Or Wo Contrast 09/01/2017 1. Mild intracranial and cervical atherosclerosis without large vessel occlusion or significant proximal stenosis. 2. No evidence of cerebral aneurysm or vascular malformation. 3. Mild interval enlargement of the left parietal hematoma. No midline shift. 4. Scattered small volume subarachnoid hemorrhage.  [name] Wo Contrast 09/01/2017 1. Similar appearance of left parietal intraparenchymal hematoma measuring 2.7 x 4.8 x 3.9 cm. Localized vasogenic edema without significant regional mass effect. No underlying mass lesion or other abnormality. 2. Small volume acute subarachnoid hemorrhage involving the bilateral cerebral hemispheres as above. 3. Moderate cerebral atrophy with chronic small vessel ischemic disease. 4. 14 mm lesion positioned within the left parotid gland, indeterminate. Follow-up examination with dedicated neck CT with contrast suggested for further evaluation on a nonemergent basis.  TTE  - Left ventricle: The cavity size was normal. Systolic function was   normal. The estimated ejection fraction was in the range of 50%   to 55%. Wall motion was normal; there were no regional wall   motion abnormalities. Left ventricular diastolic function   parameters were normal. - Ventricular septum: Septal motion showed paradox. These changes   are consistent with a post-thoracotomy state. - Aortic valve: There was trivial regurgitation. -  Atrial septum: No defect or patent foramen ovale was identified.   PHYSICAL EXAM  Temp:  [97.6 F (36.4 C)-98.4 F (36.9 C)] 97.7 F (36.5 C) (10/12 0800) Pulse Rate:  [63-87] 74 (10/12 0830) Resp:  [13-25] 24 (10/12 0830) BP: (90-139)/(52-100) 130/70 (10/12 0830) SpO2:  [90 %-96 %] 95 % (10/12 0830)  General - Well nourished, well developed, in no apparent distress.  Cardiovascular - Regular rate and rhythm.  Mental Status -  Level of arousal and orientation to time, place, and person were intact. Language including expression, naming, repetition, comprehension was assessed and found intact. Attention span and concentration were normal.  Cranial Nerves II - XII - II - Visual field intact OU. III, IV, VI - Extraocular movements intact. V - Facial sensation intact bilaterally. VII - mild R facial droop VIII - Hearing intact bilaterally X - Palate elevates symmetrically. XI - Chin turning & shoulder shrug intact bilaterally. XII - Tongue protrusion intact and midline.  Motor Strength - Strength is 2/5 bicept but otherwise 0/5 in RUE and 3+/5 in RLE. Left sided strength was intact. Increased tone on the RUE  Reflexes - No pathological reflexes.  Sensory - Touch sensation remains decreased in RUE and partially in RLE.  Coordination - intact left UE and LE.   Gait and Station - Not tested   ASSESSMENT/PLAN Jaime Pierce is a 71 y.o. male with history of CAD s/p CABG, hypertension, hyperlipidemia, COPD, tobacco use presenting with acute ICH. He did not receive IV t-PA due to hemorrhage.   ICH:  Left parietal lobe large hematoma with small R frontal SAH, most likely due to hypertension and small vessel disease  Resultant  R arm flaccid and leg mild weakness with parasthesia  CT head - Left parietal lobe hematoma with small R frontal SAH  MRI head - left parietal hematoma enlargement with small right frontal SAH  CTA head and neck - no aneurysm or AVM. Interval  enlargement of the left parietal hematoma. No midline shift.  CT repeat 09/02/17 - stable left large parietal ICH  2D Echo  EF 50-55%  LDL 81  HgbA1c 5.9  SCDs for VTE prophylaxis Diet Heart Room service appropriate? Yes; Fluid consistency: Thin  aspirin 81 mg daily prior to admission, now on No antithrombotic  Patient counseled to be compliant with his antithrombotic medications  Ongoing aggressive stroke risk factor management  Therapy recommendations:  CIR  Disposition:  Will be ready for Saturday admission to CIR  Hypertension  Stable on home meds - irbesartan, metoprolol  SBP goal <140  Currently in metoprolol only  Stable  Long-term BP goal normotensive  Hyperlipidemia  Home meds:  atorvastatin   LDL 81, goal < 70  No need to initiate statin at this time  Tobacco abuse  Current smoker  Smoking cessation counseling provided  Pt is willing to quit  Other Stroke Risk Factors  Advanced age  CAD s/p CABG  Other active condition  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 This patient's care requires constant monitoring of vital signs, hemodynamics, respiratory and cardiac monitoring, review of multiple databases, neurological assessment, discussion with family, other specialists and medical decision making of high complexity. Encouraged pt and wife to work on the [employer]. I spent 35 minutes of neurocritical care time in the care of this patient.  Marvel Plan, MD PhD Stroke Neurology 09/03/2017 9:32 AM   To contact Stroke Continuity provider, please refer to [email or web address]. After hours, contact General Neurology

## 2017-09-03 NOTE — Progress Notes (Signed)
I met with pt and his wife at bedside to discuss goals and expectations of an inpt rehab admission. They are in agreement to admit. I will begin [facility] insurance for a possible admit Monday pending approval and bed availability. [phone]

## 2017-09-04 ENCOUNTER — Inpatient Hospital Stay (HOSPITAL_COMMUNITY): Payer: Medicare HMO

## 2017-09-04 DIAGNOSIS — R252 Cramp and spasm: Secondary | ICD-10-CM

## 2017-09-04 LAB — BASIC METABOLIC PANEL
ANION GAP: 10 (ref 5–15)
BUN: 17 mg/dL (ref 6–20)
CHLORIDE: 103 mmol/L (ref 101–111)
CO2: 21 mmol/L — ABNORMAL LOW (ref 22–32)
Calcium: 8.8 mg/dL — ABNORMAL LOW (ref 8.9–10.3)
Creatinine, Ser: 0.85 mg/dL (ref 0.61–1.24)
GFR calc Af Amer: >60 mL/min (ref >60)
Glucose, Bld: 123 mg/dL — ABNORMAL HIGH (ref 65–99)
POTASSIUM: 3.7 mmol/L (ref 3.5–5.1)
SODIUM: 134 mmol/L — AB (ref 135–145)

## 2017-09-04 LAB — URINALYSIS, ROUTINE W REFLEX MICROSCOPIC
BACTERIA UA: NONE SEEN
BILIRUBIN URINE: NEGATIVE
GLUCOSE, UA: NEGATIVE mg/dL
Ketones, ur: NEGATIVE mg/dL
NITRITE: NEGATIVE
PROTEIN: NEGATIVE mg/dL
Specific Gravity, Urine: 1.024 (ref 1.005–1.030)
pH: 5 (ref 5.0–8.0)

## 2017-09-04 LAB — CBC
HCT: 45.2 % (ref 39.0–52.0)
HEMOGLOBIN: 15.3 g/dL (ref 13.0–17.0)
MCH: 31 pg (ref 26.0–34.0)
MCHC: 33.8 g/dL (ref 30.0–36.0)
MCV: 91.7 fL (ref 78.0–100.0)
PLATELETS: 189 10*3/uL (ref 150–400)
RBC: 4.93 MIL/uL (ref 4.22–5.81)
RDW: 14.2 % (ref 11.5–15.5)
WBC: 14.1 10*3/uL — AB (ref 4.0–10.5)

## 2017-09-04 LAB — MAGNESIUM: MAGNESIUM: 1.9 mg/dL (ref 1.7–2.4)

## 2017-09-04 LAB — RAPID URINE DRUG SCREEN, HOSP PERFORMED
Amphetamines: NOT DETECTED
Barbiturates: NOT DETECTED
Benzodiazepines: NOT DETECTED
Cocaine: NOT DETECTED
Opiates: NOT DETECTED
Tetrahydrocannabinol: NOT DETECTED

## 2017-09-04 MED ORDER — SODIUM CHLORIDE 0.9 % IV SOLN
1.0000 g | Freq: Once | INTRAVENOUS | Status: AC
  Administered 2017-09-04: 1 g via INTRAVENOUS
  Filled 2017-09-04: qty 10

## 2017-09-04 MED ORDER — LORAZEPAM 2 MG/ML IJ SOLN
1.0000 mg | Freq: Once | INTRAMUSCULAR | Status: AC
  Administered 2017-09-04: 1 mg via INTRAVENOUS
  Filled 2017-09-04: qty 1

## 2017-09-04 MED ORDER — BACLOFEN 10 MG PO TABS
5.0000 mg | ORAL_TABLET | Freq: Three times a day (TID) | ORAL | Status: DC
  Administered 2017-09-04 – 2017-09-08 (×13): 5 mg via ORAL
  Filled 2017-09-04 (×14): qty 1

## 2017-09-04 MED ORDER — SODIUM CHLORIDE 0.9 % IV SOLN
1.0000 g | Freq: Once | INTRAVENOUS | Status: DC
  Filled 2017-09-04: qty 10

## 2017-09-04 NOTE — Progress Notes (Addendum)
Patient having severe lower leg cramps notified provider awaiting response. Obtained verbal order for 1 mg Lorazepam and 1 g of calcium glucanate to run IV for 30 min, patient is more relaxed and not complaining of cramping at this time, will continue to monitor.

## 2017-09-04 NOTE — Progress Notes (Signed)
STROKE TEAM PROGRESS NOTE   SUBJECTIVE (INTERVAL HISTORY) His wife is at the bedside. Patient was restless overnight, due to right leg cramping, did not get good sleep, this morning groggy and mildly lethargic. Repeat CT shows stable hematoma but increased cerebral edema surrounding. We'll put on baclofen for spasm.  OBJECTIVE Temp:  [97.8 F (36.6 C)-99.3 F (37.4 C)] 98.1 F (36.7 C) (10/13 1655) Pulse Rate:  [65-88] 65 (10/13 1655) Cardiac Rhythm: Normal sinus rhythm;Heart block (10/13 0700) Resp:  [16-20] 16 (10/13 1655) BP: (112-145)/(63-77) 141/65 (10/13 1655) SpO2:  [92 %-98 %] 97 % (10/13 1655) FiO2 (%):  [32 %] 32 % (10/13 0930)  CBC:   Recent Labs Lab 09/01/17 0250  09/03/17 0323 09/04/17 0346  WBC 8.8  < > 12.6* 14.1*  NEUTROABS 4.2  --   --   --   HGB 15.9  < > 15.5 15.3  HCT 46.3  < > 45.5 45.2  MCV 92.4  < > 92.1 91.7  PLT 199  < > 210 189  < > = values in this interval not displayed.  Basic Metabolic Panel:   Recent Labs Lab 09/03/17 0323 09/04/17 0346  NA 138 134*  K 3.8 3.7  CL 107 103  CO2 22 21*  GLUCOSE 112* 123*  BUN 16 17  CREATININE 0.91 0.85  CALCIUM 8.8* 8.8*  MG  --  1.9    Lipid Panel:     Component Value Date/Time   CHOL 131 09/02/2017 0314   TRIG 73 09/02/2017 0314   TRIG 65 09/21/2006 0832   HDL 35 (L) 09/02/2017 0314   CHOLHDL 3.7 09/02/2017 0314   VLDL 15 09/02/2017 0314   LDLCALC 81 09/02/2017 0314   HgbA1c:  Lab Results  Component Value Date   HGBA1C 5.9 (H) 09/02/2017   Urine Drug Screen:     Component Value Date/Time   LABOPIA NONE DETECTED 09/04/2017 0900   COCAINSCRNUR NONE DETECTED 09/04/2017 0900   LABBENZ NONE DETECTED 09/04/2017 0900   AMPHETMU NONE DETECTED 09/04/2017 0900   THCU NONE DETECTED 09/04/2017 0900   LABBARB NONE DETECTED 09/04/2017 0900    Alcohol Level     Component Value Date/Time   ETH <10 09/01/2017 0250    IMAGING I have personally reviewed the radiological images below and  agree with the radiology interpretations.  Ct Head 09/02/2017 1. Interval increase in size of left parietal lobe intraparenchymal hematoma, now measuring 4.7 x 5.4 x 4.9 cm (estimated volume 62 mL, previously 4.2 x 2.5 x 3.1 cm - 16 mL). Increased localized vasogenic edema and regional mass effect without midline shift. 2. Persistent small volume acute subarachnoid hemorrhage within the left parietotemporal region and right frontal lobe. 3. No other new acute intracranial abnormality.  09/01/2017 4.2 x 2.5 x 3.1 cm left parietal lobe intraparenchymal hematoma (estimated volume 16 cc). Mild localized vasogenic edema without significant regional mass effect. 2. Small volume acute subarachnoid hemorrhage within the adjacent left parietal lobe as well as at the anterior right frontal lobe. 3. Moderate cerebral atrophy with chronic small vessel ischemic disease.  Ct Angio Head W Or Wo Contrast Ct Angio Neck W Or Wo Contrast 09/01/2017 1. Mild intracranial and cervical atherosclerosis without large vessel occlusion or significant proximal stenosis. 2. No evidence of cerebral aneurysm or vascular malformation. 3. Mild interval enlargement of the left parietal hematoma. No midline shift. 4. Scattered small volume subarachnoid hemorrhage.  [name] Wo Contrast 09/01/2017 1. Similar appearance of left parietal intraparenchymal  hematoma measuring 2.7 x 4.8 x 3.9 cm. Localized vasogenic edema without significant regional mass effect. No underlying mass lesion or other abnormality. 2. Small volume acute subarachnoid hemorrhage involving the bilateral cerebral hemispheres as above. 3. Moderate cerebral atrophy with chronic small vessel ischemic disease. 4. 14 mm lesion positioned within the left parotid gland, indeterminate. Follow-up examination with dedicated neck CT with contrast suggested for further evaluation on a nonemergent basis.  TTE  - Left ventricle: The cavity size was normal. Systolic  function was   normal. The estimated ejection fraction was in the range of 50%   to 55%. Wall motion was normal; there were no regional wall   motion abnormalities. Left ventricular diastolic function   parameters were normal. - Ventricular septum: Septal motion showed paradox. These changes   are consistent with a post-thoracotomy state. - Aortic valve: There was trivial regurgitation. - Atrial septum: No defect or patent foramen ovale was identified.  Ct Head Wo Contrast 09/04/2017 IMPRESSION: 1. Left parietal parenchymal hematoma has a stable morphology but does measure mildly larger than prior (54 x 52 x 57 mm compared to 49 x 52 x 55 mm). Mild increase in surrounding vasogenic edema. Progressive local mass effect and ventricular deformity without herniation or entrapment. 2. Small volume subarachnoid hemorrhage in the left parietal and right frontal lobes is stable.   PHYSICAL EXAM  Temp:  [97.8 F (36.6 C)-99.3 F (37.4 C)] 98.1 F (36.7 C) (10/13 1655) Pulse Rate:  [65-88] 65 (10/13 1655) Resp:  [16-20] 16 (10/13 1655) BP: (112-145)/(63-77) 141/65 (10/13 1655) SpO2:  [92 %-98 %] 97 % (10/13 1655) FiO2 (%):  [32 %] 32 % (10/13 0930)  General - Well nourished, well developed, in no apparent distress.  Cardiovascular - Regular rate and rhythm.  Mental Status -  Level of arousal and orientation to time, place, and person were intact. Language including expression, naming, repetition, comprehension was assessed and found intact. Mild right extremity neglect Attention span and concentration were normal.   Cranial Nerves II - XII - II - Visual field intact OU. III, IV, VI - Extraocular movements intact except incomplete rightward gaze. V - Facial sensation intact bilaterally. VII - mild R facial droop VIII - Hearing intact bilaterally X - Palate elevates symmetrically. XI - Chin turning & shoulder shrug intact bilaterally. XII - Tongue protrusion intact and  midline.  Motor Strength - Strength is 2/5 bicept but otherwise 0/5 in RUE and 3-/5 in RLE. Left sided strength was intact. Increased tone on the RUE and RLE  Reflexes - No pathological reflexes.  Sensory - Touch sensation remains decreased in RUE and partially in RLE.  Coordination - intact left UE and LE.   Gait and Station - Not tested   ASSESSMENT/PLAN Mr. NACHMEN MANSEL is a 71 y.o. male with history of CAD s/p CABG, hypertension, hyperlipidemia, COPD, tobacco use presenting with acute ICH. He did not receive IV t-PA due to hemorrhage.   ICH:  Left parietal lobe large hematoma with small R frontal SAH, most likely due to hypertension and small vessel disease  Resultant  R arm flaccid and right leg mild weakness with parasthesia  CT head - Left parietal lobe hematoma with small R frontal SAH  MRI head - left parietal hematoma enlargement with small right frontal SAH  CTA head and neck - no aneurysm or AVM. Interval enlargement of the left parietal hematoma. No midline shift.  CT repeat 09/02/17 - stable left large parietal ICH  2D Echo  EF 50-55%  LDL 81  HgbA1c 5.9  SCDs for VTE prophylaxis Diet Heart Room service appropriate? Yes; Fluid consistency: Thin  aspirin 81 mg daily prior to admission, now on No antithrombotic  Patient counseled to be compliant with his antithrombotic medications  Ongoing aggressive stroke risk factor management  Therapy recommendations:  CIR  Disposition:  Pending  Right-sided spasticity  increased muscle tone right upper extremity and right lower extremity  Baclofen 5 mg 3 times a day  Hypertension  Stable on home meds - irbesartan, metoprolol  SBP goal <140  Currently in metoprolol only  Stable  Long-term BP goal normotensive  Hyperlipidemia  Home meds:  atorvastatin   LDL 81, goal < 70  No need to initiate statin at this time  Tobacco abuse  Current smoker  Smoking cessation counseling provided  Pt  is willing to quit  Other Stroke Risk Factors  Advanced age  CAD s/p CABG  Other active condition  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 After hours, contact General Neurology

## 2017-09-05 LAB — CBC
HEMATOCRIT: 42.9 % (ref 39.0–52.0)
Hemoglobin: 14.6 g/dL (ref 13.0–17.0)
MCH: 31.2 pg (ref 26.0–34.0)
MCHC: 34 g/dL (ref 30.0–36.0)
MCV: 91.7 fL (ref 78.0–100.0)
Platelets: 203 10*3/uL (ref 150–400)
RBC: 4.68 MIL/uL (ref 4.22–5.81)
RDW: 13.9 % (ref 11.5–15.5)
WBC: 11.5 10*3/uL — ABNORMAL HIGH (ref 4.0–10.5)

## 2017-09-05 LAB — BASIC METABOLIC PANEL
Anion gap: 8 (ref 5–15)
BUN: 17 mg/dL (ref 6–20)
CALCIUM: 8.5 mg/dL — AB (ref 8.9–10.3)
CO2: 25 mmol/L (ref 22–32)
CREATININE: 0.81 mg/dL (ref 0.61–1.24)
Chloride: 99 mmol/L — ABNORMAL LOW (ref 101–111)
GFR calc Af Amer: >60 mL/min (ref >60)
GFR calc non Af Amer: >60 mL/min (ref >60)
GLUCOSE: 96 mg/dL (ref 65–99)
Potassium: 3.8 mmol/L (ref 3.5–5.1)
Sodium: 132 mmol/L — ABNORMAL LOW (ref 135–145)

## 2017-09-05 NOTE — Progress Notes (Signed)
STROKE TEAM PROGRESS NOTE   SUBJECTIVE (INTERVAL HISTORY) His wife is at the bedside. Patient right sided spasm much improved with baclofen. However, still has right hemiplegia with increased tone. No acute event overnight.  OBJECTIVE Temp:  [97.8 F (36.6 C)-98.4 F (36.9 C)] 97.8 F (36.6 C) (10/14 1351) Pulse Rate:  [65-87] 68 (10/14 1351) Cardiac Rhythm: Normal sinus rhythm;Heart block (10/14 0702) Resp:  [16-20] 16 (10/14 1351) BP: (110-141)/(65-82) 110/65 (10/14 1351) SpO2:  [95 %-98 %] 96 % (10/14 1351) FiO2 (%):  [32 %] 32 % (10/14 0842)  CBC:   Recent Labs Lab 09/01/17 0250  09/04/17 0346 09/05/17 0951  WBC 8.8  < > 14.1* 11.5*  NEUTROABS 4.2  --   --   --   HGB 15.9  < > 15.3 14.6  HCT 46.3  < > 45.2 42.9  MCV 92.4  < > 91.7 91.7  PLT 199  < > 189 203  < > = values in this interval not displayed.  Basic Metabolic Panel:   Recent Labs Lab 09/04/17 0346 09/05/17 0951  NA 134* 132*  K 3.7 3.8  CL 103 99*  CO2 21* 25  GLUCOSE 123* 96  BUN 17 17  CREATININE 0.85 0.81  CALCIUM 8.8* 8.5*  MG 1.9  --     Lipid Panel:     Component Value Date/Time   CHOL 131 09/02/2017 0314   TRIG 73 09/02/2017 0314   TRIG 65 09/21/2006 0832   HDL 35 (L) 09/02/2017 0314   CHOLHDL 3.7 09/02/2017 0314   VLDL 15 09/02/2017 0314   LDLCALC 81 09/02/2017 0314   HgbA1c:  Lab Results  Component Value Date   HGBA1C 5.9 (H) 09/02/2017   Urine Drug Screen:     Component Value Date/Time   LABOPIA NONE DETECTED 09/04/2017 0900   COCAINSCRNUR NONE DETECTED 09/04/2017 0900   LABBENZ NONE DETECTED 09/04/2017 0900   AMPHETMU NONE DETECTED 09/04/2017 0900   THCU NONE DETECTED 09/04/2017 0900   LABBARB NONE DETECTED 09/04/2017 0900    Alcohol Level     Component Value Date/Time   ETH <10 09/01/2017 0250    IMAGING I have personally reviewed the radiological images below and agree with the radiology interpretations.  Ct Head 09/02/2017 1. Interval increase in size of  left parietal lobe intraparenchymal hematoma, now measuring 4.7 x 5.4 x 4.9 cm (estimated volume 62 mL, previously 4.2 x 2.5 x 3.1 cm - 16 mL). Increased localized vasogenic edema and regional mass effect without midline shift. 2. Persistent small volume acute subarachnoid hemorrhage within the left parietotemporal region and right frontal lobe. 3. No other new acute intracranial abnormality.  09/01/2017 4.2 x 2.5 x 3.1 cm left parietal lobe intraparenchymal hematoma (estimated volume 16 cc). Mild localized vasogenic edema without significant regional mass effect. 2. Small volume acute subarachnoid hemorrhage within the adjacent left parietal lobe as well as at the anterior right frontal lobe. 3. Moderate cerebral atrophy with chronic small vessel ischemic disease.  Ct Angio Head W Or Wo Contrast Ct Angio Neck W Or Wo Contrast 09/01/2017 1. Mild intracranial and cervical atherosclerosis without large vessel occlusion or significant proximal stenosis. 2. No evidence of cerebral aneurysm or vascular malformation. 3. Mild interval enlargement of the left parietal hematoma. No midline shift. 4. Scattered small volume subarachnoid hemorrhage.  [name] Wo Contrast 09/01/2017 1. Similar appearance of left parietal intraparenchymal hematoma measuring 2.7 x 4.8 x 3.9 cm. Localized vasogenic edema without significant regional mass effect.  No underlying mass lesion or other abnormality. 2. Small volume acute subarachnoid hemorrhage involving the bilateral cerebral hemispheres as above. 3. Moderate cerebral atrophy with chronic small vessel ischemic disease. 4. 14 mm lesion positioned within the left parotid gland, indeterminate. Follow-up examination with dedicated neck CT with contrast suggested for further evaluation on a nonemergent basis.  TTE  - Left ventricle: The cavity size was normal. Systolic function was   normal. The estimated ejection fraction was in the range of 50%   to 55%. Wall  motion was normal; there were no regional wall   motion abnormalities. Left ventricular diastolic function   parameters were normal. - Ventricular septum: Septal motion showed paradox. These changes   are consistent with a post-thoracotomy state. - Aortic valve: There was trivial regurgitation. - Atrial septum: No defect or patent foramen ovale was identified.  Ct Head Wo Contrast 09/04/2017 IMPRESSION: 1. Left parietal parenchymal hematoma has a stable morphology but does measure mildly larger than prior (54 x 52 x 57 mm compared to 49 x 52 x 55 mm). Mild increase in surrounding vasogenic edema. Progressive local mass effect and ventricular deformity without herniation or entrapment. 2. Small volume subarachnoid hemorrhage in the left parietal and right frontal lobes is stable.   PHYSICAL EXAM  Temp:  [97.8 F (36.6 C)-98.4 F (36.9 C)] 97.8 F (36.6 C) (10/14 1351) Pulse Rate:  [65-87] 68 (10/14 1351) Resp:  [16-20] 16 (10/14 1351) BP: (110-141)/(65-82) 110/65 (10/14 1351) SpO2:  [95 %-98 %] 96 % (10/14 1351) FiO2 (%):  [32 %] 32 % (10/14 0842)  General - Well nourished, well developed, in no apparent distress.  Cardiovascular - Regular rate and rhythm.  Mental Status -  Level of arousal and orientation to time, place, and person were intact. Language including expression, naming, repetition, comprehension was assessed and found intact.  Attention span and concentration were normal.   Cranial Nerves II - XII - II - Visual field intact OU. III, IV, VI - Extraocular movements intact except incomplete rightward gaze. V - Facial sensation intact bilaterally. VII - mild R facial droop VIII - Hearing intact bilaterally X - Palate elevates symmetrically. XI - Chin turning & shoulder shrug intact bilaterally. XII - Tongue protrusion intact and midline.  Motor Strength - Strength is 0/5 in RUE and on pain stimulation 3-/5 in RLE. Left sided strength was intact. Increased tone on  the RUE and RLE  Reflexes - No pathological reflexes.  Sensory - Touch sensation remains decreased in RUE and RLE.  Coordination - intact left UE and LE.   Gait and Station - Not tested   ASSESSMENT/PLAN Jaime Pierce is a 71 y.o. male with history of CAD s/p CABG, hypertension, hyperlipidemia, COPD, tobacco use presenting with acute ICH. He did not receive IV t-PA due to hemorrhage.   ICH:  Left parietal lobe large hematoma with small R frontal SAH, most likely due to hypertension and small vessel disease  Resultant  R arm flaccid and right leg mild weakness with parasthesia  CT head - Left parietal lobe hematoma with small R frontal SAH  MRI head - left parietal hematoma enlargement with small right frontal SAH  CTA head and neck - no aneurysm or AVM. Interval enlargement of the left parietal hematoma. No midline shift.  CT repeat 09/02/17 and 09/04/17 - stable left large parietal ICH  2D Echo  EF 50-55%  LDL 81  HgbA1c 5.9  SCDs for VTE prophylaxis Diet Heart Room service  appropriate? Yes; Fluid consistency: Thin  aspirin 81 mg daily prior to admission, now on No antithrombotic  Patient counseled to be compliant with his antithrombotic medications  Ongoing aggressive stroke risk factor management  Therapy recommendations:  CIR  Disposition:  Pending  Right-sided spasticity  increased muscle tone right upper extremity and right lower extremity  Baclofen 5 mg 3 times a day   Improved  Hypertension  Stable on home meds - irbesartan, metoprolol  SBP goal <140  Currently in metoprolol only  Stable  Long-term BP goal normotensive  Hyperlipidemia  Home meds:  atorvastatin   LDL 81, goal < 70  Consider to resume lipitor on discharge  Tobacco abuse  Current smoker  Smoking cessation counseling provided  Pt is willing to quit  Other Stroke Risk Factors  Advanced age  CAD s/p CABG  Other active condition  B 12 deficiency - B-12  198, B-12 supplements  Mild leukocytosis - 8.8->12.1->12.6-> 14.1->11.5 (afebrile)  Hospital day # 4  Marvel Plan, MD PhD Stroke Neurology 09/05/2017 2:52 PM   To contact Stroke Continuity provider, please refer to [email or web address]. After hours, contact General Neurology

## 2017-09-06 DIAGNOSIS — I610 Nontraumatic intracerebral hemorrhage in hemisphere, subcortical: Secondary | ICD-10-CM

## 2017-09-06 LAB — CBC
HCT: 43.1 % (ref 39.0–52.0)
Hemoglobin: 15.4 g/dL (ref 13.0–17.0)
MCH: 32.6 pg (ref 26.0–34.0)
MCHC: 35.7 g/dL (ref 30.0–36.0)
MCV: 91.1 fL (ref 78.0–100.0)
PLATELETS: 227 10*3/uL (ref 150–400)
RBC: 4.73 MIL/uL (ref 4.22–5.81)
RDW: 13.9 % (ref 11.5–15.5)
WBC: 11.6 10*3/uL — AB (ref 4.0–10.5)

## 2017-09-06 LAB — BASIC METABOLIC PANEL
ANION GAP: 11 (ref 5–15)
BUN: 17 mg/dL (ref 6–20)
CALCIUM: 8.6 mg/dL — AB (ref 8.9–10.3)
CO2: 25 mmol/L (ref 22–32)
Chloride: 97 mmol/L — ABNORMAL LOW (ref 101–111)
Creatinine, Ser: 0.82 mg/dL (ref 0.61–1.24)
GFR calc Af Amer: >60 mL/min (ref >60)
GLUCOSE: 104 mg/dL — AB (ref 65–99)
POTASSIUM: 3.5 mmol/L (ref 3.5–5.1)
SODIUM: 133 mmol/L — AB (ref 135–145)

## 2017-09-06 MED ORDER — CYANOCOBALAMIN 1000 MCG PO TABS: 1000.0000 ug | ORAL_TABLET | Freq: Every day | ORAL | 0 refills | Status: DC

## 2017-09-06 NOTE — PMR Pre-admission (Signed)
PMR Admission Coordinator Pre-Admission Assessment  Patient: Jaime Pierce is an 71 y.o., male MRN: [id number] DOB: [birth year] Height: 6' (182.9 cm) Weight: 85.1 kg (187 lb 9.8 oz)              Insurance Information HMO:     PPO: yes     PCP:      IPA:      80/20:      OTHER: no HMO PRIMARY: [employer] Medicare      Policy#: [id number]      Subscriber: pt CM Name: Byrd Hesselbach Phone#: [phone]     Fax#: [phone] Pre-Cert#: [id number]   Approved for 5 days with updates due 10/22. Originally denied and approved after peer to peer and expedited appeal process   Employer: retired Benefits:  Phone #: [phone]     Name: 09/06/2017 Eff. Date: 11/23/2016     Deduct: none      Out of Pocket Max: $4500      Life Max: none CIR: $250 co pay per day days 1-6 then insurance covers 100%      SNF: no co pay days 1 through 20; $164 co pay per day days 21-100 Outpatient: $40 co pay per visit     Co-Pay: visits per medical neccesity Home Health: 100%      Co-Pay: visits per medical neccesity DME: 80%     Co-Pay: 20% Providers: in network  SECONDARY: none        Medicaid Application Date:       Case Manager:  Disability Application Date:       Case Worker:   Emergency Conservator, museum/gallery Information    Name Relation Home Work Mobile   [place] Spouse [phone]       Current Medical History  Patient Admitting Diagnosis: 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 Blood pressure 170/90. CT/MRI showed a 4.2 x 2.5 x 3.1 cm left parietal lobe intraparenchymal hematoma. Small-volume acute subarachnoid hemorrhage was in the adjacent left parietal lobe as well as at the anterior right frontal lobe. CT angiogram head and neck with  no evidence of aneurysm or vascular malformation. Follow-up neurology services with conservative care. Echocardiogram with ejection fraction of 55% no wall motion abnormalities. Follow-up cranial CT scan 09/02/2017 showed a mild interval increase in size of left parietal lobe intraparenchymal hematoma measuring 4.7 x 5.4 x 4.9 cm. No other acute intracranial abnormality. Tolerating a regular consistency diet. Increased tone in the RUE and RLE. Began Baclofen 5 mg  3 times a day.  .Total: 9 NIHSS    Past Medical History  Past Medical History:  Diagnosis Date  . Coronary artery disease   . Dyspnea on exertion   . Hyperlipidemia   . Hypertension   . Tobacco abuse     Family History  family history includes CVA in his mother; Heart disease in his father and maternal grandfather.  Prior Rehab/Hospitalizations:  Has the patient had major surgery during 100 days prior to admission? No  Current Medications   Current Facility-Administered Medications:  .   stroke: mapping our early stages of recovery book, , Does not apply, Once, Caryl Pina, MD .  acetaminophen (TYLENOL) tablet 650 mg, 650 mg, Oral, Q4H PRN, 650 mg at 09/07/17 2218 **OR** [DISCONTINUED]  acetaminophen (TYLENOL) solution 650 mg, 650 mg, Per Tube, Q4H PRN **OR** [DISCONTINUED] acetaminophen (TYLENOL) suppository 650 mg, 650 mg, Rectal, Q4H PRN, Caryl Pina, MD .  albuterol (PROVENTIL) (2.5 MG/3ML) 0.083% nebulizer solution 3 mL, 3 mL, Inhalation, Q4H PRN, Caryl Pina, MD .  baclofen (LIORESAL) tablet 5 mg, 5 mg, Oral, TID, Marvel Plan, MD, Stopped at 09/09/17 1112 .  labetalol (NORMODYNE,TRANDATE) injection 5-10 mg, 5-10 mg, Intravenous, Q2H PRN, Marvel Plan, MD .  metoprolol tartrate (LOPRESSOR) tablet 25 mg, 25 mg, Oral, Daily, Marvel Plan, MD, 25 mg at 09/09/17 1022 .  mometasone-formoterol (DULERA) 200-5 MCG/ACT inhaler 2 puff, 2 puff, Inhalation, BID, Caryl Pina, MD, 2 puff at 09/09/17 0907 .  nitroGLYCERIN  (NITROSTAT) SL tablet 0.4 mg, 0.4 mg, Sublingual, Q5 min PRN, Caryl Pina, MD .  pantoprazole (PROTONIX) EC tablet 40 mg, 40 mg, Oral, Daily, Caryl Pina, MD, 40 mg at 09/08/17 2247 .  senna-docusate (Senokot-S) tablet 1 tablet, 1 tablet, Oral, BID, Caryl Pina, MD, 1 tablet at 09/09/17 1022 .  vitamin B-12 (CYANOCOBALAMIN) tablet 1,000 mcg, 1,000 mcg, Oral, Daily, Marvel Plan, MD, 1,000 mcg at 09/09/17 1022  Patients Current Diet: Diet Heart Room service appropriate? Yes; Fluid consistency: Thin  Precautions / Restrictions Precautions Precautions: Fall Precaution Comments: right sided weakness, right inattention Restrictions Weight Bearing Restrictions: No   Has the patient had 2 or more falls or a fall with injury in the past year?No  Prior Activity Level Community (5-7x/wk): Independent pta without AD; drives, retired  Journalist, newspaper / Corporate investment banker Devices/Equipment: None Home Equipment: Information systems manager - built in  Prior Device Use: Indicate devices/aids used by the patient prior to current illness, exacerbation or injury? None of the above  Prior Functional Level Prior Function Level of Independence: Independent Comments: still drives, retired  Self Care: Did the patient need help bathing, dressing, using the toilet or eating?  Independent  Indoor Mobility: Did the patient need assistance with walking from room to room (with or without device)? Independent  Stairs: Did the patient need assistance with internal or external stairs (with or without device)? Independent  Functional Cognition: Did the patient need help planning regular tasks such as shopping or remembering to take medications? Independent  Current Functional Level Cognition  Arousal/Alertness: Awake/alert Overall Cognitive Status: Impaired/Different from baseline Current Attention Level: Selective Orientation Level: Oriented X4 Following Commands: Follows one step commands with  increased time Safety/Judgement: Decreased awareness of safety, Decreased awareness of deficits General Comments: Continues to show improvement in motor planning.  Attention: Sustained Sustained Attention: Appears intact Memory: Impaired Memory Impairment: Retrieval deficit Awareness: Appears intact Problem Solving: Appears intact Executive Function: Reasoning, Initiating, Self Correcting, Self Monitoring Reasoning: Appears intact Initiating: Appears intact Self Monitoring: Appears intact Self Correcting: Appears intact Safety/Judgment: Appears intact    Extremity Assessment (includes Sensation/Coordination)  Upper Extremity Assessment: RUE deficits/detail RUE Deficits / Details: Trace biceps/triceps noted; otherwise no active movement noted. Full PROM. Pt reports numbness thorughout RUE RUE Sensation: decreased light touch RUE Coordination: decreased fine motor, decreased gross motor  Lower Extremity Assessment: Defer to PT evaluation RLE Deficits / Details: ankle DF 3-/5, PF3-/5, knee extension 3-/5, hip flexion 3-/5.  Also, some periods of increased flexion tone in sitting EOB as PT tried to get his right leg positioned better, seemingly a pain response as it was uncomfortable for me to touch his leg.  He did better given the opportunity to actively move it to reposition.   RLE Sensation: decreased light  touch RLE Coordination: decreased gross motor    ADLs  Overall ADL's : Needs assistance/impaired Eating/Feeding: Minimal assistance Eating/Feeding Details (indicate cue type and reason): per wife, pt feeding self wtih set up Grooming: Minimal assistance Grooming Details (indicate cue type and reason): Pt using L non-dominant hand for oral care. Perseveration during oral care. Difficulty sequencing. Pt able to comb hair during unsuported sitting. Upper Body Bathing: Moderate assistance Lower Body Bathing: Moderate assistance Lower Body Bathing Details (indicate cue type and  reason): Pt able to lean forward and simulate washing BLE in sitting. Able to lean toward R side to reach L hip. Upper Body Dressing : Moderate assistance Lower Body Dressing: Maximal assistance Toilet Transfer: Moderate assistance, +2 for physical assistance, Stand-pivot Toilet Transfer Details (indicate cue type and reason): Simulated by transfer EOB>chair Functional mobility during ADLs: Moderate assistance General ADL Comments: R knee blocked and facilitation given through R thigh and L hip to facilitate sit - stand. Tactile cues to facilitate upright posture. Pt able to maintain midline postural control in standing x 5 min with weight shifting R/L while blocking R knee. Pt able to correct posture with vc only at times. AFter facilitation, Pt able to maintain standing with min A at times.     Mobility  Overal bed mobility: Needs Assistance Bed Mobility: Supine to Sit Rolling: Min assist Sidelying to sit: Mod assist Supine to sit: Mod assist Sit to sidelying: Mod assist, +2 for physical assistance General bed mobility comments: Mod A for trunk elevation.     Transfers  Overall transfer level: Needs assistance Equipment used:  (2 person lift with gait belt and chuck pad) Transfer via Lift Equipment: Stedy Transfers: Sit to/from Stand Sit to Stand: Max assist, +2 physical assistance Stand pivot transfers: Max assist, +2 physical assistance General transfer comment: Used stedy to perform sit<>stand X 2 and to transfer to chair this session. Max A +2 to stand, however, able to achieve full upright positioning with use of stedy this session. Decreased right lateral lean initially, however, when fatigued, noted increased R lateral lean in standing. Able to maintain standing for short bouts and UE and external support.     Ambulation / Gait / Stairs / Wheelchair Mobility  Ambulation/Gait General Gait Details: no appropriate at this time    Posture / Balance Dynamic Sitting Balance Sitting  balance - Comments: Able to achieve midline orientation with supervision. Noted some increased R lateral lean with fatigue, however, with cues able to achieve midline. Working on increasing WB on RUE. Required manual assist to maintain positioning of RUE.  Balance Overall balance assessment: Needs assistance Sitting-balance support: Feet supported, Single extremity supported Sitting balance-Leahy Scale: Fair Sitting balance - Comments: Able to achieve midline orientation with supervision. Noted some increased R lateral lean with fatigue, however, with cues able to achieve midline. Working on increasing WB on RUE. Required manual assist to maintain positioning of RUE.  Postural control: Right lateral lean Standing balance support: During functional activity, Bilateral upper extremity supported Standing balance-Leahy Scale: Poor Standing balance comment: Required BUE to maintain upright posture. Assist to maintain RUE on stedy.     Special needs/care consideration BiPAP/CPAP  N/a CPM  N/a Continuous Drip IV  N/a Dialysis  N/a Life Vest  N/a Oxygen  N/a Special Bed  N/a Trach Size  N/a Wound Vac n/a Skin  intact Bowel mgmt: continent  Bladder mgmt: continent Diabetic mgmt Hgb A1c 5.9 Smoker   Previous Home Environment Living Arrangements: Spouse/significant other  Lives With: Spouse Available Help at Discharge: Family, Available 24 hours/day Type of Home: House Home Layout: One level Home Access: Stairs to enter Entrance Stairs-Rails: None Entrance Stairs-Number of Steps: 2 Bathroom Shower/Tub: Health visitor: Standard Bathroom Accessibility: Yes How Accessible: Accessible via walker Home Care Services: No Additional Comments: liked to play golf  Discharge Living Setting Plans for Discharge Living Setting: Patient's home, Lives with (comment) (wife) Type of Home at Discharge: House Discharge Home Layout: One level Discharge Home Access: Stairs to  enter Entrance Stairs-Rails: None Entrance Stairs-Number of Steps: 2 Discharge Bathroom Shower/Tub: Garment/textile technologist: Standard Discharge Bathroom Accessibility: Yes How Accessible: Accessible via walker Does the patient have any problems obtaining your medications?: No  Social/Family/Support Systems Patient Roles: Spouse Contact Information: Lupita Leash, wife Anticipated Caregiver: wife Anticipated Caregiver's Contact Information: see above Ability/Limitations of Caregiver: no limitations Caregiver Availability: 24/7 Discharge Plan Discussed with Primary Caregiver: Yes Is Caregiver In Agreement with Plan?: Yes Does Caregiver/Family have Issues with Lodging/Transportation while Pt is in Rehab?: No (wife stays with pt 24/7 in hospital)  Goals/Additional Needs Patient/Family Goal for Rehab: supervision to min assist with PT, OT, and SLP  Expected length of stay: ELOS 19- 22 days Pt/Family Agrees to Admission and willing to participate: Yes Program Orientation Provided & Reviewed with Pt/Caregiver Including Roles  & Responsibilities: Yes  Decrease burden of Care through IP rehab admission: n/a  Possible need for SNF placement upon discharge: not anticipated  Patient Condition: This patient's medical and functional status has changed since the consult dated: 09/02/2017 in which the Rehabilitation Physician determined and documented that the patient's condition is appropriate for intensive rehabilitative care in an inpatient rehabilitation facility. See "History of Present Illness" (above) for medical update. Functional changes are: overall mod to max assist. Patient's medical and functional status update has been discussed with the Rehabilitation physician and patient remains appropriate for inpatient rehabilitation. Will admit to inpatient rehab today.  Preadmission Screen Completed By:  Clois Dupes, 09/09/2017 4:47  PM ______________________________________________________________________   Discussed status with Dr. Wynn Banker on 09/09/2017 at  1646 and received telephone approval for admission today.  Admission Coordinator:  Clois Dupes, time 4540 Date 09/09/2017

## 2017-09-06 NOTE — Progress Notes (Signed)
SLP Cancellation Note  Patient Details Name: Jaime Pierce MRN: [id number] DOB: 10-20-1946   Cancelled treatment:       Reason Eval/Treat Not Completed: Other (comment). Pt resting after two therapy sessions and meal, falling asleep during our conversation with wife. Pt found to have deficits in working memory in prior assessment that warrant f/u at next level of care. Discussed with wife. Pt to d/c to CIR today potentially, will defer further assessment of higher level cognition to that level of care.    Nolen Lindamood, Riley Nearing 09/06/2017, 1:50 PM

## 2017-09-06 NOTE — Care Management Important Message (Signed)
Important Message  Patient Details  Name: Jaime Pierce MRN: [id number] Date of Birth: March 03, 1946   Medicare Important Message Given:  Yes    Dorena Bodo 09/06/2017, 2:08 PM

## 2017-09-06 NOTE — Progress Notes (Signed)
STROKE TEAM PROGRESS NOTE   SUBJECTIVE (INTERVAL HISTORY) His wife is at the bedside. Patient has no complaints today. However, still has right hemiplegia with increased tone. No acute event overnight.  OBJECTIVE Temp:  [97.6 F (36.4 C)-98.2 F (36.8 C)] 97.6 F (36.4 C) (10/15 0947) Pulse Rate:  [68-85] 85 (10/15 0951) Cardiac Rhythm: Heart block (10/15 0703) Resp:  [16-20] 16 (10/15 0951) BP: (110-131)/(60-83) 126/60 (10/15 0947) SpO2:  [94 %-96 %] 95 % (10/15 0951)  CBC:   Recent Labs Lab 09/01/17 0250  09/05/17 0951 09/06/17 0506  WBC 8.8  < > 11.5* 11.6*  NEUTROABS 4.2  --   --   --   HGB 15.9  < > 14.6 15.4  HCT 46.3  < > 42.9 43.1  MCV 92.4  < > 91.7 91.1  PLT 199  < > 203 227  < > = values in this interval not displayed.  Basic Metabolic Panel:   Recent Labs Lab 09/04/17 0346 09/05/17 0951 09/06/17 0506  NA 134* 132* 133*  K 3.7 3.8 3.5  CL 103 99* 97*  CO2 21* 25 25  GLUCOSE 123* 96 104*  BUN CREATININE 0.85 0.81 0.82  CALCIUM 8.8* 8.5* 8.6*  MG 1.9  --   --     Lipid Panel:     Component Value Date/Time   CHOL 131 09/02/2017 0314   TRIG 73 09/02/2017 0314   TRIG 65 09/21/2006 0832   HDL 35 (L) 09/02/2017 0314   CHOLHDL 3.7 09/02/2017 0314   VLDL 15 09/02/2017 0314   LDLCALC 81 09/02/2017 0314   HgbA1c:  Lab Results  Component Value Date   HGBA1C 5.9 (H) 09/02/2017   Urine Drug Screen:     Component Value Date/Time   LABOPIA NONE DETECTED 09/04/2017 0900   COCAINSCRNUR NONE DETECTED 09/04/2017 0900   LABBENZ NONE DETECTED 09/04/2017 0900   AMPHETMU NONE DETECTED 09/04/2017 0900   THCU NONE DETECTED 09/04/2017 0900   LABBARB NONE DETECTED 09/04/2017 0900    Alcohol Level     Component Value Date/Time   ETH <10 09/01/2017 0250    IMAGING I have personally reviewed the radiological images below and agree with the radiology interpretations.  Ct Head 09/02/2017 1. Interval increase in size of left parietal lobe  intraparenchymal hematoma, now measuring 4.7 x 5.4 x 4.9 cm (estimated volume 62 mL, previously 4.2 x 2.5 x 3.1 cm - 16 mL). Increased localized vasogenic edema and regional mass effect without midline shift. 2. Persistent small volume acute subarachnoid hemorrhage within the left parietotemporal region and right frontal lobe. 3. No other new acute intracranial abnormality.  09/01/2017 4.2 x 2.5 x 3.1 cm left parietal lobe intraparenchymal hematoma (estimated volume 16 cc). Mild localized vasogenic edema without significant regional mass effect. 2. Small volume acute subarachnoid hemorrhage within the adjacent left parietal lobe as well as at the anterior right frontal lobe. 3. Moderate cerebral atrophy with chronic small vessel ischemic disease.  Ct Angio Head W Or Wo Contrast Ct Angio Neck W Or Wo Contrast 09/01/2017 1. Mild intracranial and cervical atherosclerosis without large vessel occlusion or significant proximal stenosis. 2. No evidence of cerebral aneurysm or vascular malformation. 3. Mild interval enlargement of the left parietal hematoma. No midline shift. 4. Scattered small volume subarachnoid hemorrhage.  [name] Wo Contrast 09/01/2017 1. Similar appearance of left parietal intraparenchymal hematoma measuring 2.7 x 4.8 x 3.9 cm. Localized vasogenic edema without significant regional mass effect. No  underlying mass lesion or other abnormality. 2. Small volume acute subarachnoid hemorrhage involving the bilateral cerebral hemispheres as above. 3. Moderate cerebral atrophy with chronic small vessel ischemic disease. 4. 14 mm lesion positioned within the left parotid gland, indeterminate. Follow-up examination with dedicated neck CT with contrast suggested for further evaluation on a nonemergent basis.  TTE  - Left ventricle: The cavity size was normal. Systolic function was   normal. The estimated ejection fraction was in the range of 50%   to 55%. Wall motion was normal;  there were no regional wall   motion abnormalities. Left ventricular diastolic function   parameters were normal. - Ventricular septum: Septal motion showed paradox. These changes   are consistent with a post-thoracotomy state. - Aortic valve: There was trivial regurgitation. - Atrial septum: No defect or patent foramen ovale was identified.  Ct Head Wo Contrast 09/04/2017 IMPRESSION: 1. Left parietal parenchymal hematoma has a stable morphology but does measure mildly larger than prior (54 x 52 x 57 mm compared to 49 x 52 x 55 mm). Mild increase in surrounding vasogenic edema. Progressive local mass effect and ventricular deformity without herniation or entrapment. 2. Small volume subarachnoid hemorrhage in the left parietal and right frontal lobes is stable.   PHYSICAL EXAM  Temp:  [97.6 F (36.4 C)-98.2 F (36.8 C)] 97.6 F (36.4 C) (10/15 0947) Pulse Rate:  [68-85] 85 (10/15 0951) Resp:  [16-20] 16 (10/15 0951) BP: (110-131)/(60-83) 126/60 (10/15 0947) SpO2:  [94 %-96 %] 95 % (10/15 0951)  General - Well nourished, well developed, in no apparent distress.  Cardiovascular - Regular rate and rhythm.  Mental Status -  Level of arousal and orientation to time, place, and person were intact. Language including expression, naming, repetition, comprehension was assessed and found intact.  Attention span and concentration were normal.   Cranial Nerves II - XII - II - Visual field intact OU. III, IV, VI - Extraocular movements intact except mild right gaze restriction V - Facial sensation intact bilaterally. VII - mild R facial droop VIII - Hearing intact bilaterally X - Palate elevates symmetrically. XI - Chin turning & shoulder shrug intact bilaterally. XII - Tongue protrusion intact and midline.  Motor Strength - Strength is 0/5 in RUE and on pain stimulation 3-/5 in RLE. Left sided strength was intact. Increased tone on the RUE and RLE  Reflexes - No pathological  reflexes.  Sensory - Touch sensation remains decreased in RUE and RLE.  Coordination - intact left UE and LE.   Gait and Station - Not tested   ASSESSMENT/PLAN Jaime Pierce is a 71 y.o. male with history of CAD s/p CABG, hypertension, hyperlipidemia, COPD, tobacco use presenting with acute ICH. He did not receive IV t-PA due to hemorrhage.   ICH:  Left parietal lobe large hematoma with small R frontal SAH, most likely due to hypertension and small vessel disease  Resultant  R arm flaccid and right leg mild weakness with parasthesia  CT head - Left parietal lobe hematoma with small R frontal SAH  MRI head - left parietal hematoma enlargement with small right frontal SAH  CTA head and neck - no aneurysm or AVM. Interval enlargement of the left parietal hematoma. No midline shift.  CT repeat 09/02/17 and 09/04/17 - stable left large parietal ICH  2D Echo  EF 50-55%  LDL 81  HgbA1c 5.9  SCDs for VTE prophylaxis Diet Heart Room service appropriate? Yes; Fluid consistency: Thin  aspirin 81 mg  daily prior to admission, now on No antithrombotic  Patient counseled to be compliant with his antithrombotic medications  Ongoing aggressive stroke risk factor management  Therapy recommendations:  CIR  Disposition:  Pending  Right-sided spasticity  increased muscle tone right upper extremity and right lower extremity  Baclofen 5 mg 3 times a day   Improved  Hypertension  Stable on home meds - irbesartan, metoprolol  SBP goal <140  Currently in metoprolol only  Stable  Long-term BP goal normotensive  Hyperlipidemia  Home meds:  atorvastatin   LDL 81, goal < 70  Consider to resume lipitor on discharge  Tobacco abuse  Current smoker  Smoking cessation counseling provided  Pt is willing to quit  Other Stroke Risk Factors  Advanced age  CAD s/p CABG  Other active condition  B 12 deficiency - B-12 198, B-12 supplements  Mild leukocytosis -  8.8->12.1->12.6-> 14.1->11.5 (afebrile)  Hospital day # 5  Patient is medically stable for transfer to inpatient rehabilitation after insurance approval and when bed available. Delia Heady, MD Stroke Neurology 09/06/2017 12:24 PM   To contact Stroke Continuity provider, please refer to [email or web address]. After hours, contact General Neurology

## 2017-09-06 NOTE — Progress Notes (Signed)
I await [employer] decision on a possible inpt rehab admission. [phone]

## 2017-09-06 NOTE — Progress Notes (Signed)
Physical Therapy Treatment Patient Details Name: Jaime Pierce MRN: [id number] DOB: Sep 25, 1946 Today's Date: 09/06/2017    History of Present Illness 71 y.o. male admitted on 09/01/17 for R sided weakness with CT revealing a L parietal lobe intraparenchymal hematoma, small volume SAH in adjacent L parietal lobe as well as R frontal lobe.  Pt with significant PMH of HTN, DOE, CAD, and CABG.      PT Comments    Pt continues to make gains in sitting balance and transfers.  Attempting to increase his independence during session having him do more of his own care and repositioning.  He is having a hard time with multi step command processing and sequencing.  I would like to re-consult SLP for higher level cognitive training.  Pt also showing signs of right sided inattention.  CIR remains appropriate at discharge from the acute hospital setting.     Follow Up Recommendations  CIR     Equipment Recommendations  3in1 (PT);Rolling walker with 5" wheels;Other (comment) (R platform RW?)    Recommendations for Other Services Rehab consult     Precautions / Restrictions Precautions Precautions: Fall Precaution Comments: right sided weakness, right inattention    Mobility  Bed Mobility Overal bed mobility: Needs Assistance Bed Mobility: Rolling;Sidelying to Sit Rolling: Min assist Sidelying to sit: Max assist       General bed mobility comments: Rolled to the right with min assist to help pt find right bed rail, max assist to pull with left hand up to sitting, attempted pushing up with left hand on bed rail, but pt could not sequence this move.  Attempted having pt push his right leg off the side of the bed with his left leg and he also did not process what to do in this situation.    Transfers Overall transfer level: Needs assistance   Transfers: Sit to/from Stand;Stand Pivot Transfers Sit to Stand: Mod assist;From elevated surface Stand pivot transfers: From elevated surface  (with steady)       General transfer comment: Pt required mod assist to help power up to stand from elevated bed wtih bil knees blocked by stander.  Verbal and visual cues to find midline standing balance once standing.  Transferred to recliner using the standing frame with one person assist. Will likely need two people to get up to standing frame from lower recliner chair.       Modified Rankin (Stroke Patients Only) Modified Rankin (Stroke Patients Only) Pre-Morbid Rankin Score: No symptoms Modified Rankin: Severe disability     Balance Overall balance assessment: Needs assistance Sitting-balance support: Feet supported;Single extremity supported;No upper extremity supported Sitting balance-Leahy Scale: Fair Sitting balance - Comments: Pt took less time today to find midline in sitting and was able to ultimately release his left hand from bed rail at end of the bed and sit with close supervision EOB.  Postural control: Right lateral lean (anterior) Standing balance support: Single extremity supported Standing balance-Leahy Scale: Poor Standing balance comment: assist from standing frame                            Cognition Arousal/Alertness: Awake/alert Behavior During Therapy: Flat affect Overall Cognitive Status: Impaired/Different from baseline Area of Impairment: Attention;Memory;Following commands;Awareness;Safety/judgement;Problem solving                   Current Attention Level: Selective Memory: Decreased short-term memory Following Commands: Follows one step commands with increased time Safety/Judgement:  Decreased awareness of safety;Decreased awareness of deficits Awareness: Emergent Problem Solving: Decreased initiation;Difficulty sequencing;Requires verbal cues;Requires tactile cues General Comments: Pt showed signs of inattention to his right side today, difficulty with multi step commands, needed to simplify and repeat with multimodal cues.        Exercises General Exercises - Lower Extremity Mini-Sqauts: AROM;Both;Other (comment);5 reps (from the stedy standing frame)    General Comments General comments (skin integrity, edema, etc.): Pt's O2 sats on RA 94%, left off of O2  and RN made aware to check.       Pertinent Vitals/Pain Pain Assessment: Faces Faces Pain Scale: Hurts little more Pain Location: right leg and arm, cramping, increased tone.  Reports baclofen is helping.  Pain Descriptors / Indicators: Burning;Cramping Pain Intervention(s): Limited activity within patient's tolerance;Monitored during session;Repositioned           PT Goals (current goals can now be found in the care plan section) Acute Rehab PT Goals Patient Stated Goal: to work hard and rehab hard so he can get better Progress towards PT goals: Progressing toward goals    Frequency    Min 4X/week      PT Plan Current plan remains appropriate       AM-PAC PT "6 Clicks" Daily Activity  Outcome Measure  Difficulty turning over in bed (including adjusting bedclothes, sheets and blankets)?: Unable Difficulty moving from lying on back to sitting on the side of the bed? : Unable Difficulty sitting down on and standing up from a chair with arms (e.g., wheelchair, bedside commode, etc,.)?: Unable Help needed moving to and from a bed to chair (including a wheelchair)?: A Lot Help needed walking in hospital room?: Total Help needed climbing 3-5 steps with a railing? : Total 6 Click Score: 7    End of Session Equipment Utilized During Treatment: Gait belt Activity Tolerance: Patient tolerated treatment well Patient left: in chair;with call bell/phone within reach;with family/visitor present Nurse Communication: Mobility status;Need for lift equipment;Other (comment) (O2 sats are stable on RA) PT Visit Diagnosis: Other symptoms and signs involving the nervous system (R29.898);Difficulty in walking, not elsewhere classified (R26.2);Hemiplegia  and hemiparesis Hemiplegia - Right/Left: Right Hemiplegia - dominant/non-dominant: Dominant Hemiplegia - caused by: Nontraumatic intracerebral hemorrhage;Nontraumatic [facility]     Time: 6045-4098 PT Time Calculation (min) (ACUTE ONLY): 41 min  Charges:  $Therapeutic Activity: 23-37 mins $Neuromuscular Re-education: 8-22 mins          Jerrol Helmers B. Toriano Aikey, PT, DPT [phone]            09/06/2017, 10:43 AM

## 2017-09-06 NOTE — Progress Notes (Signed)
Occupational Therapy Treatment Patient Details Name: Jaime Pierce MRN: [id number] DOB: November 22, 1946 Today's Date: 09/06/2017    History of present illness 71 y.o. male admitted on 09/01/17 for R sided weakness with CT revealing a L parietal lobe intraparenchymal hematoma, small volume SAH in adjacent L parietal lobe as well as R frontal lobe.  Pt with significant PMH of HTN, DOE, CAD, and CABG.     OT comments  Focus of session on inhibition of RUE tone followed by functional grooming task in unsupported sitting. Pt with significant U/LE tone in flexor synergy pattern, however, pt responds well to inhibition techniques. Increased ability to maintain midline postoral control in unsupported sitting.  Apparent difficulty with motor planning during functional tasks. Wife present and educated on activities to complete with pt at chair and bed level. Excellent participation. Continue to recommend CIR for intensive rehab to maximize functional level of independence with facilitate safe DC home with wife. Will continue to follow acutely.   Follow Up Recommendations  CIR;Supervision/Assistance - 24 hour    Equipment Recommendations  Other (comment) (TBA)    Recommendations for Other Services Rehab consult    Precautions / Restrictions Precautions Precautions: Fall Precaution Comments: right sided weakness, right inattention       Mobility Bed Mobility - OOB in chair  Transfers Overall transfer level: Needs assistance     Balance Overall balance assessment: Needs assistance Sitting-balance support: Feet supported;Single extremity supported;No upper extremity supported Sitting balance-Leahy Scale: Fair Sitting balance - Comments: r bias Postural control: Right lateral lean (anterior) Standing balance support: Single extremity supported Standing balance-Leahy Scale: Poor Standing balance comment: assist from standing frame                           ADL either performed  or assessed with clinical judgement   ADL Overall ADL's : Needs assistance/impaired   Eating/Feeding Details (indicate cue type and reason): per wife, pt feeding self wtih set up Grooming: Minimal assistance Grooming Details (indicate cue type and reason): Pt using L non-dominant hand for oral care. Perseveration during oral care. Difficulty sequencing. Pt able to comb hair during unsuported sitting. Upper Body Bathing: Moderate assistance                           Functional mobility during ADLs: Maximal assistance General ADL Comments: Motor planning dieficits and perseveration noted during ADL tasks  Discussed ADL with pt/wife and asked wife to bring in electric razor for pt to use to shave Discussed set up of ADL with repetition to help with motor planning difficulties Discussed positioning of pt in R sidelying for weight bearing in bed Discussed activities to complete when sitting to sork on trunk control     Vision   Vision Assessment?: Vision impaired- to be further tested in functional context Additional Comments: poor visual attention; will further assess   Perception     Praxis  motor planning deficits; perseveration    Cognition Arousal/Alertness: Awake/alert Behavior During Therapy: Flat affect Overall Cognitive Status: Impaired/Different from baseline Area of Impairment: Attention;Memory;Following commands;Safety/judgement;Awareness;Problem solving                   Current Attention Level: Selective Memory: Decreased short-term memory Following Commands: Follows one step commands with increased time Safety/Judgement: Decreased awareness of safety;Decreased awareness of deficits Awareness: Emergent Problem Solving: Slow processing;Decreased initiation;Difficulty sequencing;Requires verbal cues;Requires tactile cues General Comments: Pt showed  signs of inattention to his right side today, difficulty with multi step commands, needed to simplify and  repeat with multimodal cues.         Exercises Exercises: Other exercises General Exercises -   Other Exercises Other Exercises: inhibition of tone using trunk rotation followed by scapular protraction when leaning forard to inhibit UE flexor synergy Other Exercises: Facilitation of midline postural control during activities crossing midline while incorporating weight bearing through RUE   Shoulder Instructions       General Comments     Pertinent Vitals/ Pain       Pain Assessment: Faces Faces Pain Scale: Hurts little more Pain Location: RUE Pain Descriptors / Indicators: Cramping;Discomfort Pain Intervention(s): Limited activity within patient's tolerance  Home Living                                          Prior Functioning/Environment              Frequency  Min 3X/week        Progress Toward Goals  OT Goals(current goals can now be found in the care plan section)  Progress towards OT goals: Progressing toward goals  Acute Rehab OT Goals Patient Stated Goal: to work hard and rehab hard so he can get better OT Goal Formulation: With patient/family Time For Goal Achievement: 09/16/17 Potential to Achieve Goals: Good ADL Goals Pt Will Perform Grooming: with min assist;sitting Pt Will Transfer to Toilet: with min assist;stand pivot transfer;bedside commode Pt Will Perform Toileting - Clothing Manipulation and hygiene: with min assist;sit to/from stand Additional ADL Goal #1: Pt/wife will demonstrate proper positioning of RUE for protection and edema control.  Plan Discharge plan remains appropriate    Co-evaluation                 AM-PAC PT "6 Clicks" Daily Activity     Outcome Measure   Help from another person eating meals?: A Little Help from another person taking care of personal grooming?: A Little Help from another person toileting, which includes using toliet, bedpan, or urinal?: A Lot Help from another person bathing  (including washing, rinsing, drying)?: A Lot Help from another person to put on and taking off regular upper body clothing?: A Lot Help from another person to put on and taking off regular lower body clothing?: A Lot 6 Click Score: 14    End of Session    OT Visit Diagnosis: Unsteadiness on feet (R26.81);Other abnormalities of gait and mobility (R26.89);Muscle weakness (generalized) (M62.81);Hemiplegia and hemiparesis Hemiplegia - Right/Left: Right Hemiplegia - dominant/non-dominant: Dominant Hemiplegia - caused by: Other Nontraumatic intracranial hemorrhage;Nontraumatic SAH   Activity Tolerance Patient tolerated treatment well   Patient Left in chair;with call bell/phone within reach;with family/visitor present   Nurse Communication Mobility status        Time: 1130-1159 OT Time Calculation (min): 29 min  Charges: OT General Charges $OT Visit: 1 Visit OT Treatments $Self Care/Home Management : 8-22 mins $Neuromuscular Re-education: 8-22 mins  [facility], OT/L  [phone] 09/06/2017   Clennon Nasca,HILLARY 09/06/2017, 1:38 PM

## 2017-09-07 ENCOUNTER — Inpatient Hospital Stay (HOSPITAL_COMMUNITY): Payer: Medicare HMO | Admitting: Occupational Therapy

## 2017-09-07 DIAGNOSIS — I611 Nontraumatic intracerebral hemorrhage in hemisphere, cortical: Principal | ICD-10-CM

## 2017-09-07 LAB — CBC
HCT: 42.9 % (ref 39.0–52.0)
Hemoglobin: 14.5 g/dL (ref 13.0–17.0)
MCH: 30.9 pg (ref 26.0–34.0)
MCHC: 33.8 g/dL (ref 30.0–36.0)
MCV: 91.3 fL (ref 78.0–100.0)
PLATELETS: 241 10*3/uL (ref 150–400)
RBC: 4.7 MIL/uL (ref 4.22–5.81)
RDW: 13.6 % (ref 11.5–15.5)
WBC: 12.4 10*3/uL — ABNORMAL HIGH (ref 4.0–10.5)

## 2017-09-07 LAB — BASIC METABOLIC PANEL
Anion gap: 10 (ref 5–15)
BUN: 15 mg/dL (ref 6–20)
CALCIUM: 8.7 mg/dL — AB (ref 8.9–10.3)
CO2: 26 mmol/L (ref 22–32)
CREATININE: 0.84 mg/dL (ref 0.61–1.24)
Chloride: 95 mmol/L — ABNORMAL LOW (ref 101–111)
GFR calc Af Amer: >60 mL/min (ref >60)
Glucose, Bld: 112 mg/dL — ABNORMAL HIGH (ref 65–99)
POTASSIUM: 3.8 mmol/L (ref 3.5–5.1)
SODIUM: 131 mmol/L — AB (ref 135–145)

## 2017-09-07 NOTE — Progress Notes (Signed)
STROKE TEAM PROGRESS NOTE   SUBJECTIVE (INTERVAL HISTORY) His wife is at the bedside. He is resting comfortably today tolerating dysphagia diet albeit not enthusiastically. He remains medically stable with persistent right sided weakness.  OBJECTIVE  CBC:   Recent Labs Lab 09/01/17 0250  09/06/17 0506 09/07/17 0628  WBC 8.8  < > 11.6* 12.4*  NEUTROABS 4.2  --   --   --   HGB 15.9  < > 15.4 14.5  HCT 46.3  < > 43.1 42.9  MCV 92.4  < > 91.1 91.3  PLT 199  < > 227 241  < > = values in this interval not displayed.  Basic Metabolic Panel:   Recent Labs Lab 09/04/17 0346  09/06/17 0506 09/07/17 0628  NA 134*  < > 133* 131*  K 3.7  < > 3.5 3.8  CL 103  < > 97* 95*  CO2 21*  < > 25 26  GLUCOSE 123*  < > 104* 112*  BUN 17  < > 17 15  CREATININE 0.85  < > 0.82 0.84  CALCIUM 8.8*  < > 8.6* 8.7*  MG 1.9  --   --   --   < > = values in this interval not displayed.  Lipid Panel:     Component Value Date/Time   CHOL 131 09/02/2017 0314   TRIG 73 09/02/2017 0314   TRIG 65 09/21/2006 0832   HDL 35 (L) 09/02/2017 0314   CHOLHDL 3.7 09/02/2017 0314   VLDL 15 09/02/2017 0314   LDLCALC 81 09/02/2017 0314   HgbA1c:  Lab Results  Component Value Date   HGBA1C 5.9 (H) 09/02/2017   Urine Drug Screen:     Component Value Date/Time   LABOPIA NONE DETECTED 09/04/2017 0900   COCAINSCRNUR NONE DETECTED 09/04/2017 0900   LABBENZ NONE DETECTED 09/04/2017 0900   AMPHETMU NONE DETECTED 09/04/2017 0900   THCU NONE DETECTED 09/04/2017 0900   LABBARB NONE DETECTED 09/04/2017 0900    Alcohol Level     Component Value Date/Time   ETH <10 09/01/2017 0250    IMAGING I have personally reviewed the radiological images below and agree with the radiology interpretations.  Ct Head 09/02/2017 1. Interval increase in size of left parietal lobe intraparenchymal hematoma, now measuring 4.7 x 5.4 x 4.9 cm (estimated volume 62 mL, previously 4.2 x 2.5 x 3.1 cm - 16 mL). Increased localized  vasogenic edema and regional mass effect without midline shift. 2. Persistent small volume acute subarachnoid hemorrhage within the left parietotemporal region and right frontal lobe. 3. No other new acute intracranial abnormality.  09/01/2017 4.2 x 2.5 x 3.1 cm left parietal lobe intraparenchymal hematoma (estimated volume 16 cc). Mild localized vasogenic edema without significant regional mass effect. 2. Small volume acute subarachnoid hemorrhage within the adjacent left parietal lobe as well as at the anterior right frontal lobe. 3. Moderate cerebral atrophy with chronic small vessel ischemic disease.  Ct Angio Head W Or Wo Contrast Ct Angio Neck W Or Wo Contrast 09/01/2017 1. Mild intracranial and cervical atherosclerosis without large vessel occlusion or significant proximal stenosis. 2. No evidence of cerebral aneurysm or vascular malformation. 3. Mild interval enlargement of the left parietal hematoma. No midline shift. 4. Scattered small volume subarachnoid hemorrhage.  [name] Wo Contrast 09/01/2017 1. Similar appearance of left parietal intraparenchymal hematoma measuring 2.7 x 4.8 x 3.9 cm. Localized vasogenic edema without significant regional mass effect. No underlying mass lesion or other abnormality. 2. Small volume  acute subarachnoid hemorrhage involving the bilateral cerebral hemispheres as above. 3. Moderate cerebral atrophy with chronic small vessel ischemic disease. 4. 14 mm lesion positioned within the left parotid gland, indeterminate. Follow-up examination with dedicated neck CT with contrast suggested for further evaluation on a nonemergent basis.  TTE  - Left ventricle: The cavity size was normal. Systolic function was   normal. The estimated ejection fraction was in the range of 50%   to 55%. Wall motion was normal; there were no regional wall   motion abnormalities. Left ventricular diastolic function   parameters were normal. - Ventricular septum: Septal  motion showed paradox. These changes   are consistent with a post-thoracotomy state. - Aortic valve: There was trivial regurgitation. - Atrial septum: No defect or patent foramen ovale was identified.  Ct Head Wo Contrast 09/04/2017 IMPRESSION: 1. Left parietal parenchymal hematoma has a stable morphology but does measure mildly larger than prior (54 x 52 x 57 mm compared to 49 x 52 x 55 mm). Mild increase in surrounding vasogenic edema. Progressive local mass effect and ventricular deformity without herniation or entrapment. 2. Small volume subarachnoid hemorrhage in the left parietal and right frontal lobes is stable.   PHYSICAL EXAM Temp:  [97.8 F (36.6 C)-99.5 F (37.5 C)] 98.8 F (37.1 C) (10/16 1414) Pulse Rate:  [52-83] 69 (10/16 1414) Resp:  [16-18] 16 (10/16 1100) BP: (117-136)/(64-84) 117/70 (10/16 1414) SpO2:  [92 %-100 %] 96 % (10/16 1414)  General - Well nourished, well developed, in no apparent distress.  Mental Status -  Level of arousal and orientation to time, place, and person were intact. Language was intact conversationally Attention span and concentration were normal.   Cranial Nerves II - XII - II - Visual field intact OU. III, IV, VI - Extraocular movements intact except mild right gaze restriction V - Facial sensation intact bilaterally. VII - mild R facial droop VIII - Hearing intact bilaterally X - Palate elevates symmetrically. XI - Chin turning & shoulder shrug intact bilaterally. XII - Tongue protrusion intact and midline.  Motor Strength - Strength is 0/5 in RUE and 3/5 in RLE to painful stimuli. Left sided strength was intact. Increased tone on the RUE and RLE  Reflexes - No pathological reflexes.  Sensory - Touch sensation remains present but decreased in RUE and RLE.  Coordination - intact left UE and LE.   Gait and Station - Not tested   ASSESSMENT/PLAN Mr. JAIREN GOLDFARB is a 71 y.o. male with history of CAD s/p CABG,  hypertension, hyperlipidemia, COPD, tobacco use presenting with acute ICH. He did not receive IV t-PA due to hemorrhage.   ICH:  Left parietal lobe large hematoma with small R frontal SAH, most likely due to hypertension and small vessel disease  Resultant  R arm flaccid and right leg mild weakness with parasthesia  CT head - Left parietal lobe hematoma with small R frontal SAH  MRI head - left parietal hematoma enlargement with small right frontal SAH  CTA head and neck - no aneurysm or AVM. Interval enlargement of the left parietal hematoma. No midline shift.  CT repeat 09/02/17 and 09/04/17 - stable left large parietal ICH  2D Echo  EF 50-55%  LDL 81  HgbA1c 5.9  SCDs for VTE prophylaxis Diet Heart Room service appropriate? Yes; Fluid consistency: Thin  aspirin 81 mg daily prior to admission, now on No antithrombotic  Patient counseled to be compliant with his antithrombotic medications  Ongoing aggressive stroke risk  factor management  Therapy recommendations:  CIR  Disposition:  Pending  Right-sided spasticity  increased muscle tone right upper extremity and right lower extremity  Baclofen 5 mg 3 times a day   Improved  Hypertension  Home meds - irbesartan, metoprolol  Currently he is stable and controlled on metoprolol alone  Long-term BP goal normotensive < 130/90  Hyperlipidemia  Home meds:  atorvastatin   LDL 81, goal < 70  Consider to resume lipitor on discharge  Tobacco abuse  Current smoker  Smoking cessation counseling provided  Pt is willing to quit  Other Stroke Risk Factors  Advanced age  CAD s/p CABG  Other active condition  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   Delia Heady, MD Medical Director [facility] Pager: [phone] 09/07/2017 5:34 PM   To contact Stroke Continuity provider, please refer to [email or web address]. After hours, contact General Neurology

## 2017-09-07 NOTE — Progress Notes (Signed)
Physical Therapy Treatment Patient Details Name: Jaime Pierce MRN: [id number] DOB: 1946/03/09 Today's Date: 09/07/2017    History of Present Illness 71 y.o. male admitted on 09/01/17 for R sided weakness with CT revealing a L parietal lobe intraparenchymal hematoma, small volume SAH in adjacent L parietal lobe as well as R frontal lobe.  Pt with significant PMH of HTN, DOE, CAD, and CABG.      PT Comments    Patient is progressing toward mobility goals. Treatment focus today on trunk control exercises and he was able to tolerate more therapy today at EOB. Patient is following simple commands and able to initiate active LE movement once tone has been broken with facilitation techniques. Still requires increased assistance with bed mobility and stand pivot transfers. Patient is demonstrating improved awareness of midline and able to actively self-correct when sitting balance is challenged to his right side. CIR remains an appropriate recommendation to address impairments and maximize functional independence. PT will continue to follow acutely and progress as tolerated.   Follow Up Recommendations  CIR     Equipment Recommendations  3in1 (PT);Rolling walker with 5" wheels;Other (comment) (R platform RW?)    Recommendations for Other Services Speech consult (re-consult)     Precautions / Restrictions Precautions Precautions: Fall Precaution Comments: right sided weakness, right inattention Restrictions Weight Bearing Restrictions: No    Mobility  Bed Mobility Overal bed mobility: Needs Assistance Bed Mobility: Rolling;Sidelying to Sit Rolling: Min assist Sidelying to sit: Max assist;+2 for physical assistance       General bed mobility comments: Pt able to roll to the right with min assist (VCs) to help him find right bed rail. Max assist +2 to push with left hand on bed rail up to sitting.  Assisted R LE to EOB and use of bed pad to transition hips to EOB.      Transfers Overall transfer level: Needs assistance Equipment used: 2 person hand held assist Transfers: Sit to/from [employer] Sit to Stand: Mod assist;+2 physical assistance Stand pivot transfers: Mod assist;+2 physical assistance       General transfer comment: Required mod assist +2  to stand EOB with knees blocked. VCs to stand straight upright. Stand pivot to R side needed mod A throughout. Able to take a few small steps with uncontrolled descent to chair.   Ambulation/Gait                 Stairs            Wheelchair Mobility    Modified Rankin (Stroke Patients Only) Modified Rankin (Stroke Patients Only) Pre-Morbid Rankin Score: No symptoms Modified Rankin: Severe disability     Balance Overall balance assessment: Needs assistance Sitting-balance support: Feet supported;Single extremity supported Sitting balance-Leahy Scale: Good   Postural control: Right lateral lean Standing balance support: During functional activity Standing balance-Leahy Scale: Zero Standing balance comment: pt requires mod assist +2 for standing balance                            Cognition Arousal/Alertness: Awake/alert Behavior During Therapy: Flat affect Overall Cognitive Status: Impaired/Different from baseline Area of Impairment: Memory;Following commands;Safety/judgement;Problem solving;Awareness;Attention                   Current Attention Level: Selective Memory: Decreased short-term memory Following Commands: Follows one step commands with increased time;Follows one step commands consistently Safety/Judgement: Decreased awareness of safety;Decreased awareness of deficits Awareness: Emergent  Problem Solving: Slow processing;Decreased initiation;Difficulty sequencing;Requires verbal cues;Requires tactile cues General Comments: Pt still having difficulty with multistep cues; needing to simplify to simple cues but he is able to  follow those consistently. Still showing signs of inattention to right side      Exercises Other Exercises Other Exercises: inhibition of tone using rhythmic initiation techniques Other Exercises: postural control activites facilitating midline and reaching to R    General Comments        Pertinent Vitals/Pain Pain Assessment: Faces Faces Pain Scale: Hurts little more Pain Location: R Leg Pain Descriptors / Indicators: Cramping;Discomfort Pain Intervention(s): Monitored during session    Home Living                      Prior Function            PT Goals (current goals can now be found in the care plan section) Acute Rehab PT Goals Patient Stated Goal: to work hard and rehab hard so he can get better PT Goal Formulation: With patient/family Time For Goal Achievement: 09/16/17 Potential to Achieve Goals: Good Progress towards PT goals: Progressing toward goals    Frequency    Min 4X/week      PT Plan Current plan remains appropriate    Co-evaluation              AM-PAC PT "6 Clicks" Daily Activity  Outcome Measure  Difficulty turning over in bed (including adjusting bedclothes, sheets and blankets)?: Unable Difficulty moving from lying on back to sitting on the side of the bed? : Unable Difficulty sitting down on and standing up from a chair with arms (e.g., wheelchair, bedside commode, etc,.)?: Unable Help needed moving to and from a bed to chair (including a wheelchair)?: A Lot Help needed walking in hospital room?: Total Help needed climbing 3-5 steps with a railing? : Total 6 Click Score: 7    End of Session Equipment Utilized During Treatment: Gait belt Activity Tolerance: Patient tolerated treatment well Patient left: in chair;with call bell/phone within reach;with family/visitor present Nurse Communication: Mobility status PT Visit Diagnosis: Other symptoms and signs involving the nervous system (R29.898);Difficulty in walking, not  elsewhere classified (R26.2);Hemiplegia and hemiparesis Hemiplegia - Right/Left: Right Hemiplegia - dominant/non-dominant: Dominant Hemiplegia - caused by: Nontraumatic intracerebral hemorrhage;Nontraumatic [facility]     Time: 1610-9604 PT Time Calculation (min) (ACUTE ONLY): 28 min  Charges:  $Therapeutic Activity: 23-37 mins                    G CodesMckinley Jewel, SPT [phone] office    Fonnie Birkenhead 09/07/2017, 5:53 PM

## 2017-09-08 NOTE — Progress Notes (Signed)
OT Treatment Note   Pt making excellent progress.and is demonstrating significant improvements with trunk control and transitional movements into standing. Pt able to sit unsupported EOB with S and vc only to maintain midline postural control. Using facilitation techniques, pt able to complete sit - stand with mod A + 2. Able to maintain midline upright postural control with min A at times while holding recliner and blocking R knee. Pt with apparent motor planning deficits, but functional performance improves with repetition. Pt overall Mod A with ADL Feel pt has potential to DC home with 24/7 assistance most likely at wc level initially Pt very motivated to participate with OT and feel pt is an excellent CIR candidate.Family presetn during session. Will continue to follow acutely.   09/08/17 1500  OT Visit Information  Last OT Received On 09/08/17  Assistance Needed +2  History of Present Illness 71 y.o. male admitted on 09/01/17 for R sided weakness with CT revealing a L parietal lobe intraparenchymal hematoma, small volume SAH in adjacent L parietal lobe as well as R frontal lobe.  Pt with significant PMH of HTN, DOE, CAD, and CABG.    Precautions  Precautions Fall  Pain Assessment  Pain Assessment Faces  Faces Pain Scale 4  Pain Location knees  Pain Descriptors / Indicators Discomfort;Grimacing  Pain Intervention(s) Limited activity within patient's tolerance  Cognition  Arousal/Alertness Awake/alert  Behavior During Therapy Flat affect  Overall Cognitive Status Impaired/Different from baseline  Area of Impairment Attention;Following commands;Safety/judgement;Awareness;Problem solving  Current Attention Level Selective  Following Commands Follows one step commands with increased time  Awareness Emergent  Problem Solving Slow processing;Difficulty sequencing;Requires verbal cues;Requires tactile cues  General Comments apparent motor planning deficits however, improvement since last  session  ADL  Eating/Feeding Minimal assistance  Upper Body Bathing Moderate assistance  Lower Body Bathing Moderate assistance  Lower Body Bathing Details (indicate cue type and reason) Pt able to lean forward and simulate washing BLE in sitting. Able to lean toward R side to reach L hip.  Upper Body Dressing  Moderate assistance  Lower Body Dressing Maximal assistance  Functional mobility during ADLs Moderate assistance  General ADL Comments R knee blocked and facilitation given through R thigh and L hip to facilitate sit - stand. Tactile cues to facilitate upright posture. Pt able to maintain midline postural control in standing x 5 min with weight shifting R/L while blocking R knee. Pt able to correct posture with vc only at times. AFter facilitation, Pt able to maintain standing with min A at times.   Bed Mobility  Overal bed mobility Needs Assistance  Bed Mobility Sidelying to Sit;Sit to Sidelying;Rolling  Rolling Min assist  Sidelying to sit Mod assist  Sit to sidelying Mod assist;+2 for physical assistance  General bed mobility comments rolling to R and using transitional movements for weight bearing through RUE. Pt able to push up to sit, then achieve midline positioning with min A  Balance  Sitting balance-Leahy Scale Fair  Sitting balance - Comments Able to maintain midlien posture wth S.  Standing balance-Leahy Scale Poor  Standing balance comment Used recliner in front of pt to steady self during standing task. Pt able to maintain upright postural control with min A at times. Able to shift weight laterally r - L with tactile/vc.  Vision- Assessment  Vision Assessment? Vision impaired- to be further tested in functional context  Additional Comments decreased visual attention to R  Exercises  Exercises Other exercises  Other Exercises  Other  Exercises using body on arm rotational movements in supine to separte trunk/upper body to inhibit tone. Able to position pt oin R  sidelying with decreased spasticity. Able to achieve 90 FF with arm ER. Wife educated on importance of R sidelying  OT - End of Session  Equipment Utilized During Treatment Gait belt;Oxygen (2L)  Activity Tolerance Patient tolerated treatment well  Patient left in bed;with call bell/phone within reach;with bed alarm set;with family/visitor present;with SCD's reapplied  Nurse Communication Mobility status  OT Assessment/Plan  OT Plan Discharge plan remains appropriate  OT Visit Diagnosis Unsteadiness on feet (R26.81);Other abnormalities of gait and mobility (R26.89);Muscle weakness (generalized) (M62.81);Hemiplegia and hemiparesis  Hemiplegia - Right/Left Right  Hemiplegia - dominant/non-dominant Dominant  Hemiplegia - caused by Other Nontraumatic intracranial hemorrhage;Nontraumatic SAH  OT Frequency (ACUTE ONLY) Min 3X/week  Recommendations for Other Services Rehab consult  Follow Up Recommendations CIR;Supervision/Assistance - 24 hour  OT Equipment Other (comment)  AM-PAC OT "6 Clicks" Daily Activity Outcome Measure  Help from another person eating meals? 3  Help from another person taking care of personal grooming? 3  Help from another person toileting, which includes using toliet, bedpan, or urinal? 2  Help from another person bathing (including washing, rinsing, drying)? 2  Help from another person to put on and taking off regular upper body clothing? 2  Help from another person to put on and taking off regular lower body clothing? 2  6 Click Score 14  ADL G Code Conversion CK  OT Goal Progression  Progress towards OT goals Progressing toward goals  Acute Rehab OT Goals  Patient Stated Goal to work hard and rehab hard so he can get better  OT Goal Formulation With patient/family  Time For Goal Achievement 09/16/17  Potential to Achieve Goals Good  ADL Goals  Pt Will Perform Grooming with min assist;sitting  Pt Will Transfer to Toilet with min assist;stand pivot  transfer;bedside commode  Pt Will Perform Toileting - Clothing Manipulation and hygiene with min assist;sit to/from stand  Additional ADL Goal #1 Pt/wife will demonstrate proper positioning of RUE for protection and edema control.  OT Time Calculation  OT Start Time (ACUTE ONLY) 1450  OT Stop Time (ACUTE ONLY) 1530  OT Time Calculation (min) 40 min  OT General Charges  $OT Visit 1 Visit  OT Treatments  $Self Care/Home Management  8-22 mins  $Neuromuscular Re-education 23-37 mins  [facility] Gaylen Pereira, OT/L  [phone] 09/08/2017

## 2017-09-08 NOTE — Progress Notes (Signed)
STROKE TEAM PROGRESS NOTE   SUBJECTIVE (INTERVAL HISTORY) His wife is at the bedside. He is resting comfortably today. He participated with therapy yesterday who noted persistent R hemiplegia and impaired cognitive ability to quickly plan motor tasks. His previously recommended admission to CIR was declined by [facility] today pending peer to peer review.  OBJECTIVE  CBC:   Recent Labs Lab 09/06/17 0506 09/07/17 0628  WBC 11.6* 12.4*  HGB 15.4 14.5  HCT 43.1 42.9  MCV 91.1 91.3  PLT 227 241    Basic Metabolic Panel:   Recent Labs Lab 09/04/17 0346  09/06/17 0506 09/07/17 0628  NA 134*  < > 133* 131*  K 3.7  < > 3.5 3.8  CL 103  < > 97* 95*  CO2 21*  < > 25 26  GLUCOSE 123*  < > 104* 112*  BUN 17  < > 17 15  CREATININE 0.85  < > 0.82 0.84  CALCIUM 8.8*  < > 8.6* 8.7*  MG 1.9  --   --   --   < > = values in this interval not displayed.  Lipid Panel:     Component Value Date/Time   CHOL 131 09/02/2017 0314   TRIG 73 09/02/2017 0314   TRIG 65 09/21/2006 0832   HDL 35 (L) 09/02/2017 0314   CHOLHDL 3.7 09/02/2017 0314   VLDL 15 09/02/2017 0314   LDLCALC 81 09/02/2017 0314   HgbA1c:  Lab Results  Component Value Date   HGBA1C 5.9 (H) 09/02/2017   Urine Drug Screen:     Component Value Date/Time   LABOPIA NONE DETECTED 09/04/2017 0900   COCAINSCRNUR NONE DETECTED 09/04/2017 0900   LABBENZ NONE DETECTED 09/04/2017 0900   AMPHETMU NONE DETECTED 09/04/2017 0900   THCU NONE DETECTED 09/04/2017 0900   LABBARB NONE DETECTED 09/04/2017 0900    Alcohol Level     Component Value Date/Time   ETH <10 09/01/2017 0250    IMAGING I have personally reviewed the radiological images below and agree with the radiology interpretations.  Ct Head 09/02/2017 1. Interval increase in size of left parietal lobe intraparenchymal hematoma, now measuring 4.7 x 5.4 x 4.9 cm (estimated volume 62 mL, previously 4.2 x 2.5 x 3.1 cm - 16 mL). Increased localized vasogenic edema and  regional mass effect without midline shift. 2. Persistent small volume acute subarachnoid hemorrhage within the left parietotemporal region and right frontal lobe. 3. No other new acute intracranial abnormality.  09/01/2017 4.2 x 2.5 x 3.1 cm left parietal lobe intraparenchymal hematoma (estimated volume 16 cc). Mild localized vasogenic edema without significant regional mass effect. 2. Small volume acute subarachnoid hemorrhage within the adjacent left parietal lobe as well as at the anterior right frontal lobe. 3. Moderate cerebral atrophy with chronic small vessel ischemic disease.  Ct Angio Head W Or Wo Contrast Ct Angio Neck W Or Wo Contrast 09/01/2017 1. Mild intracranial and cervical atherosclerosis without large vessel occlusion or significant proximal stenosis. 2. No evidence of cerebral aneurysm or vascular malformation. 3. Mild interval enlargement of the left parietal hematoma. No midline shift. 4. Scattered small volume subarachnoid hemorrhage.  [name] Wo Contrast 09/01/2017 1. Similar appearance of left parietal intraparenchymal hematoma measuring 2.7 x 4.8 x 3.9 cm. Localized vasogenic edema without significant regional mass effect. No underlying mass lesion or other abnormality. 2. Small volume acute subarachnoid hemorrhage involving the bilateral cerebral hemispheres as above. 3. Moderate cerebral atrophy with chronic small vessel ischemic disease. 4. 14 mm  lesion positioned within the left parotid gland, indeterminate. Follow-up examination with dedicated neck CT with contrast suggested for further evaluation on a nonemergent basis.  TTE  - Left ventricle: The cavity size was normal. Systolic function was   normal. The estimated ejection fraction was in the range of 50%   to 55%. Wall motion was normal; there were no regional wall   motion abnormalities. Left ventricular diastolic function   parameters were normal. - Ventricular septum: Septal motion showed  paradox. These changes   are consistent with a post-thoracotomy state. - Aortic valve: There was trivial regurgitation. - Atrial septum: No defect or patent foramen ovale was identified.  Ct Head Wo Contrast 09/04/2017 IMPRESSION: 1. Left parietal parenchymal hematoma has a stable morphology but does measure mildly larger than prior (54 x 52 x 57 mm compared to 49 x 52 x 55 mm). Mild increase in surrounding vasogenic edema. Progressive local mass effect and ventricular deformity without herniation or entrapment. 2. Small volume subarachnoid hemorrhage in the left parietal and right frontal lobes is stable.   PHYSICAL EXAM Temp:  [97.4 F (36.3 C)-99.1 F (37.3 C)] 99.1 F (37.3 C) (10/17 1355) Pulse Rate:  [74-87] 87 (10/17 1355) Resp:  [18] 18 (10/17 1355) BP: (117-142)/(62-76) 129/75 (10/17 1355) SpO2:  [91 %-98 %] 98 % (10/17 1355) FiO2 (%):  [32 %] 32 % (10/17 1207)  General - Well nourished, well developed, in no apparent distress.  Skin - Erythematous rash present over right buttock and proximal posterior thigh, in area of diminished light touch sensation  Mental Status -  Level of arousal and orientation to time, place, and person were intact. Language was intact conversationally Attention span and concentration were normal.   Cranial Nerves II - XII - II - Visual field intact OU. III, IV, VI - Extraocular movements intact except mild right gaze restriction V - Facial sensation intact bilaterally. VII - mild R facial droop VIII - Hearing intact bilaterally X - Palate elevates symmetrically. XI - Chin turning & shoulder shrug intact bilaterally. XII - Tongue protrusion intact and midline.  Motor Strength - Strength is 0/5 in RUE and 3/5 in RLE to painful stimuli. Left sided strength was intact. Increased tone on the RUE and RLE  Reflexes - No pathological reflexes.  Sensory - Touch sensation remains present but decreased in RUE and RLE.  Coordination - intact left  UE and LE.   Gait and Station - Not tested   ASSESSMENT/PLAN [name] is a 71 y.o. male with history of CAD s/p CABG, hypertension, hyperlipidemia, COPD, tobacco use presenting with acute ICH. He did not receive IV t-PA due to hemorrhage.   ICH:  Left parietal lobe large hematoma with small R frontal SAH, most likely due to hypertension and small vessel disease  Resultant  R arm flaccid and right leg mild weakness with parasthesia  CT head - Left parietal lobe hematoma with small R frontal SAH  MRI head - left parietal hematoma enlargement with small right frontal SAH  CTA head and neck - no aneurysm or AVM. Interval enlargement of the left parietal hematoma. No midline shift.  CT repeat 09/02/17 and 09/04/17 - stable left large parietal ICH  2D Echo  EF 50-55%  LDL 81  HgbA1c 5.9  SCDs for VTE prophylaxis Diet Heart Room service appropriate? Yes; Fluid consistency: Thin  aspirin 81 mg daily prior to admission, now on No antithrombotic  Patient counseled to be compliant with his antithrombotic  medications  Ongoing aggressive stroke risk factor management  Therapy recommendations:  CIR  Disposition:  Pending (CIR if approved)  Right-sided spasticity  increased muscle tone right upper extremity and right lower extremity  Baclofen 5 mg 3 times a day   Improved  Hypertension  Home meds - irbesartan, metoprolol  Currently he is stable and controlled on metoprolol alone  Long-term BP goal normotensive < 130/90  Hyperlipidemia  Home meds:  atorvastatin 40mg   LDL 81, goal < 70  Consider to resume lipitor on discharge  Tobacco abuse  Current smoker  Smoking cessation counseling provided  Pt is willing to quit  Other Stroke Risk Factors  Advanced age  CAD s/p CABG  Other active condition  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 D/w wife and rehab coordinator Delia Heady, MD Medical Director [facility] Pager: [phone] 09/08/2017 2:58 PM To contact Stroke Continuity provider, please refer to [email or web address]. After hours, contact General Neurology

## 2017-09-08 NOTE — Progress Notes (Signed)
Pt observed to have a large area of red rash on his right buttock,  that is said to be itchy, cleaned and a barrier Mositurizing cream applied, was however reassured. Obasogie-Asidi, Jolisa Intriago Efe

## 2017-09-08 NOTE — Progress Notes (Signed)
[facility]. They feel pt unable to tolerate the intensity of rehab that is required. I have spoken with Dr, Pearlean BrownieSethi and he will do a peer to peer with Dr. Judi SaaSeema Mishra today at [phone]. I have informed pt's wife of the denial and she is appealing also by calling [phone]. RN CM made aware. I will follow up today with final decision after peer to peer. [phone]

## 2017-09-08 NOTE — Progress Notes (Signed)
Physical Therapy Treatment Patient Details Name: Jaime Pierce MRN: [id number] DOB: 1946/03/28 Today's Date: 09/08/2017    History of Present Illness 71 y.o. male admitted on 09/01/17 for R sided weakness with CT revealing a L parietal lobe intraparenchymal hematoma, small volume SAH in adjacent L parietal lobe as well as R frontal lobe.  Pt with significant PMH of HTN, DOE, CAD, and CABG.      PT Comments    Pt much improved with sitting EOB tolerance this date and ability to maintain midline and upright position while sitting EOB with min guard support and v/c's for optimal position. Pt very motivated for therapy as he wants maximal functional recovery and to walk again. Pt was indep TPA and now requires maxA for out of bed mobility. Pt with good home set up and support and would be an excellent candidate for CIR upon d/c for maximal functional recovery to decrease burden of care on  Spouse and to improve quality of life.   Follow Up Recommendations  CIR     Equipment Recommendations  3in1 (PT);Rolling walker with 5" wheels;Other (comment)    Recommendations for Other Services Speech consult     Precautions / Restrictions Precautions Precautions: Fall Precaution Comments: right sided weakness, right inattention Restrictions Weight Bearing Restrictions: No    Mobility  Bed Mobility Overal bed mobility: Needs Assistance Bed Mobility: Rolling;Sidelying to Sit Rolling: Min assist Sidelying to sit: Max assist;+2 for physical assistance       General bed mobility comments: v/c's to push up with L UE, assist for trunk elevation  Transfers Overall transfer level: Needs assistance Equipment used:  (2 person lift with gait belt and chuck pad) Transfers: Sit to/from [employer] Transfers Sit to Stand: Max assist;+2 physical assistance Stand pivot transfers: Max assist;+2 physical assistance       General transfer comment: unable to achieve full upright standing  this date. mostly likely due to prolonged EOB sitting and trying to use urinal  Ambulation/Gait             General Gait Details: no appropriate at this time   Stairs            Wheelchair Mobility    Modified Rankin (Stroke Patients Only) Modified Rankin (Stroke Patients Only) Pre-Morbid Rankin Score: No symptoms Modified Rankin: Severe disability     Balance Overall balance assessment: Needs assistance Sitting-balance support: Feet supported;Single extremity supported Sitting balance-Leahy Scale: Good Sitting balance - Comments: strong push with L UE to the right, v/c's to keep L UE in lap, tactile cues to R UE in optimal position for support in sitting. with v/c's pt can retract shoulder blades together and maintain upright posture and midline x 30 sec.  pt tolerated EOB x 10 min today Postural control: Right lateral lean Standing balance support: During functional activity Standing balance-Leahy Scale: Zero Standing balance comment: pt requires max assist +2 for standing balance                            Cognition Arousal/Alertness: Awake/alert Behavior During Therapy: Flat affect Overall Cognitive Status: Impaired/Different from baseline Area of Impairment: Memory;Following commands;Safety/judgement;Problem solving;Awareness;Attention                   Current Attention Level: Selective Memory: Decreased short-term memory Following Commands: Follows one step commands with increased time;Follows one step commands consistently Safety/Judgement: Decreased awareness of safety;Decreased awareness of deficits Awareness: Emergent Problem Solving:  Slow processing;Decreased initiation;Difficulty sequencing;Requires verbal cues;Requires tactile cues General Comments: Pt still having difficulty with multistep cues; needing to simplify to simple cues but he is able to follow those consistently. Still showing signs of inattention to right side       Exercises      General Comments General comments (skin integrity, edema, etc.): assist pt with using urinal at EOB      Pertinent Vitals/Pain Pain Assessment: Faces Faces Pain Scale: Hurts little more Pain Location: R leg Pain Descriptors / Indicators: Cramping;Discomfort Pain Intervention(s): Monitored during session    Home Living                      Prior Function            PT Goals (current goals can now be found in the care plan section) Progress towards PT goals: Progressing toward goals    Frequency    Min 4X/week      PT Plan Current plan remains appropriate    Co-evaluation              AM-PAC PT "6 Clicks" Daily Activity  Outcome Measure  Difficulty turning over in bed (including adjusting bedclothes, sheets and blankets)?: Unable Difficulty moving from lying on back to sitting on the side of the bed? : Unable Difficulty sitting down on and standing up from a chair with arms (e.g., wheelchair, bedside commode, etc,.)?: Unable Help needed moving to and from a bed to chair (including a wheelchair)?: Total Help needed walking in hospital room?: Total Help needed climbing 3-5 steps with a railing? : Total 6 Click Score: 6    End of Session         PT Visit Diagnosis: Other symptoms and signs involving the nervous system (R29.898);Difficulty in walking, not elsewhere classified (R26.2);Hemiplegia and hemiparesis Hemiplegia - Right/Left: Right Hemiplegia - dominant/non-dominant: Dominant Hemiplegia - caused by: Nontraumatic intracerebral hemorrhage;[facility]     Time: [id number] PT Time Calculation (min) (ACUTE ONLY): 29 min  Charges:  $Therapeutic Activity: 8-22 mins $Neuromuscular Re-education: 8-22 mins                    G Codes:       Lewis ShockAshly Cruz Devilla, PT, DPT Pager #: [phone] Office #: [phone]    Anjelica Gorniak M Elner Seifert 09/08/2017, 2:47 PM

## 2017-09-08 NOTE — Progress Notes (Signed)
I continue to wait for insurance decision to admit pt to inpt rehab. [phone]

## 2017-09-08 NOTE — Clinical Social Work Note (Signed)
Clinical Social Work Assessment  Patient Details  Name: Jaime Pierce MRN: [id number] Date of Birth: 22-Oct-1946  Date of referral:  09/08/17               Reason for consult:  Facility Placement                Permission sought to share information with:  Facility Sport and exercise psychologist, Family Supports Permission granted to share information::  Yes, Verbal Permission Granted  Name::     Chartered certified accountant::  SNF  Relationship::  Wife  Contact Information:     Housing/Transportation Living arrangements for the past 2 months:  Single Family Home Source of Information:  Spouse Patient Interpreter Needed:  None Criminal Activity/Legal Involvement Pertinent to Current Situation/Hospitalization:  No - Comment as needed Significant Relationships:  Adult Children, Spouse Lives with:  Self, Spouse Do you feel safe going back to the place where you live?  Yes Need for family participation in patient care:  No (Coment)  Care giving concerns:  Patient has been living at home with spouse, but currently requires short term rehab prior to returning home.   Social Worker assessment / plan:  CSW met with patient's wife, Jaime Pierce, to discuss back-up plan if the insurance appeal process is unsuccessful. CSW provided SNF list to patient's wife and informed her of the referral process and need to research facilities to select preference. CSW completed referral and faxed to facilities. CSW to follow up with patient's wife to discuss facility preferences and facilitate discharge to SNF if needed.  Employment status:  Retired Nurse, adult PT Recommendations:  Inpatient [place] / Referral to community resources:  [place]  Patient/Family's Response to care:  Patient and patient's wife is agreeable to SNF placement, if needed, as a back-up to CIR due to possible insurance denial.  Patient/Family's Understanding of and Emotional Response to  Diagnosis, Current Treatment, and Prognosis:  Patient and patient's wife are upset that insurance authorization has denied CIR admission, but are hopeful that the MD is able to successfully appeal the decision. Patient's wife was tearful during discussion and acknowledged feeling overwhelmed due to the whole process currently. Patient's wife is hopeful that the patient will be able to make progress quickly once admitted to rehab so that they can get home soon.  Emotional Assessment Appearance:  Appears stated age Attitude/Demeanor/Rapport:    Affect (typically observed):  Appropriate Orientation:  Oriented to Self, Oriented to Place, Oriented to Situation, Oriented to  Time Alcohol / Substance use:  Not Applicable Psych involvement (Current and /or in the community):  No (Comment)  Discharge Needs  Concerns to be addressed:  Care Coordination Readmission within the last 30 days:  No Current discharge risk:  Physical Impairment Barriers to Discharge:  Continued Medical Work up, [place], [place] 09/08/2017, 3:15 PM

## 2017-09-08 NOTE — Progress Notes (Signed)
Dr. Leonie Man has completed Peer to peer with Bernadene Person MD, Dr. Eloise Levels. They have asked for updated therapy notes which I have provided. I await final determination by insurance for possible admit tomorrow. I met with wife and daughter and they are aware. RN CM and SW updated. [phone]

## 2017-09-08 NOTE — NC FL2 (Signed)
[place] MEDICAID FL2 LEVEL OF CARE SCREENING TOOL     IDENTIFICATION  Patient Name: JAMIR RONE Birthdate: 07/18/1946 Sex: male Admission Date (Current Location): 09/01/2017  [facility] and [place] Number:  Producer, television/film/video and Address:  [facility] [place], [address] [place], [place] [address]      Provider Number: [id number]  Attending Physician Name and Address:  Micki Riley, MD  Relative Name and Phone Number:       Current Level of Care: Hospital Recommended Level of Care: Skilled Nursing Facility Prior Approval Number:    Date Approved/Denied:   PASRR Number: [id number]  Discharge Plan: SNF    Current Diagnoses: Patient Active Problem List   Diagnosis Date Noted  . SAH (subarachnoid hemorrhage) (HCC) 09/02/2017  . B12 deficiency 09/02/2017  . ICH (intracerebral hemorrhage) (HCC) 09/01/2017  . Well adult exam 06/16/2016  . Cigarette smoker 02/23/2016  . COPD GOLD II  12/24/2014  . CAD (coronary artery disease) 07/22/2011  . HTN (hypertension) 07/22/2011  . Dyslipidemia 07/22/2011  . Smoker 07/22/2011    Orientation RESPIRATION BLADDER Height & Weight     Self, Time, Situation, Place  O2 ([place] 2L) Continent Weight: 187 lb 9.8 oz (85.1 kg) Height:  6' (182.9 cm)  BEHAVIORAL SYMPTOMS/MOOD NEUROLOGICAL BOWEL NUTRITION STATUS      Continent Diet (low sodium)  AMBULATORY STATUS COMMUNICATION OF NEEDS Skin   Extensive Assist Verbally Bruising                       Personal Care Assistance Level of Assistance  Bathing, Dressing Bathing Assistance: Maximum assistance   Dressing Assistance: Maximum assistance     Functional Limitations Info             SPECIAL CARE FACTORS FREQUENCY  PT (By licensed PT), OT (By licensed OT)     PT Frequency: 5x/wk OT Frequency: 5x/wk            Contractures      Additional Factors Info  Code Status, Allergies Code Status Info: full Allergies Info: Sulfonamide  Derivatives           Current Medications (09/08/2017):  This is the current hospital active medication list Current Facility-Administered Medications  Medication Dose Route Frequency Provider Last Rate Last Dose  .  stroke: mapping our early stages of recovery book   Does not apply Once Caryl Pina, MD      . acetaminophen (TYLENOL) tablet 650 mg  650 mg Oral Q4H PRN Caryl Pina, MD   650 mg at 09/07/17 2218  . albuterol (PROVENTIL) (2.5 MG/3ML) 0.083% nebulizer solution 3 mL  3 mL Inhalation Q4H PRN Caryl Pina, MD      . baclofen (LIORESAL) tablet 5 mg  5 mg Oral TID Marvel Plan, MD   5 mg at 09/08/17 5621  . labetalol (NORMODYNE,TRANDATE) injection 5-10 mg  5-10 mg Intravenous Q2H PRN Marvel Plan, MD      . metoprolol tartrate (LOPRESSOR) tablet 25 mg  25 mg Oral Daily Marvel Plan, MD   25 mg at 09/08/17 3086  . mometasone-formoterol (DULERA) 200-5 MCG/ACT inhaler 2 puff  2 puff Inhalation BID Caryl Pina, MD   2 puff at 09/07/17 2046  . nitroGLYCERIN (NITROSTAT) SL tablet 0.4 mg  0.4 mg Sublingual Q5 min PRN Caryl Pina, MD      . pantoprazole (PROTONIX) EC tablet 40 mg  40 mg Oral Daily Caryl Pina, MD  40 mg at 09/07/17 2151  . senna-docusate (Senokot-S) tablet 1 tablet  1 tablet Oral BID Caryl PinaLindzen, Eric, MD   1 tablet at 09/08/17 [phone]  . vitamin B-12 (CYANOCOBALAMIN) tablet 1,000 mcg  1,000 mcg Oral Daily Marvel PlanXu, Jindong, MD   1,000 mcg at 09/08/17 [id number]     Discharge Medications: Please see discharge summary for a list of discharge medications.  Relevant Imaging Results:  Relevant Lab Results:   Additional Information SS#: [id number]  Baldemar LenisElizabeth M Paisley, LCSW   I have personally examined this patient, reviewed notes, independently viewed imaging studies, participated in medical decision making and plan of care.ROS completed by me personally and pertinent positives fully documented  I have made any additions or clarifications directly to the above note. Agree with  note above.   Delia HeadyPramod [name], MD Medical Director [facility] Pager: [phone] 09/09/2017 2:36 PM

## 2017-09-09 ENCOUNTER — Inpatient Hospital Stay (HOSPITAL_COMMUNITY)
Admission: RE | Admit: 2017-09-09 | Discharge: 2017-10-16 | DRG: 057 | Disposition: A | Discharge status: Home / Self Care | Payer: Medicare HMO | Source: Intra-hospital | Attending: Physical Medicine & Rehabilitation | Admitting: Physical Medicine & Rehabilitation

## 2017-09-09 DIAGNOSIS — I610 Nontraumatic intracerebral hemorrhage in hemisphere, subcortical: Secondary | ICD-10-CM | POA: Diagnosis not present

## 2017-09-09 DIAGNOSIS — B372 Candidiasis of skin and nail: Secondary | ICD-10-CM | POA: Diagnosis not present

## 2017-09-09 DIAGNOSIS — Z823 Family history of stroke: Secondary | ICD-10-CM

## 2017-09-09 DIAGNOSIS — I69320 Aphasia following cerebral infarction: Secondary | ICD-10-CM | POA: Diagnosis not present

## 2017-09-09 DIAGNOSIS — B369 Superficial mycosis, unspecified: Secondary | ICD-10-CM | POA: Diagnosis present

## 2017-09-09 DIAGNOSIS — R52 Pain, unspecified: Secondary | ICD-10-CM

## 2017-09-09 DIAGNOSIS — E871 Hypo-osmolality and hyponatremia: Secondary | ICD-10-CM

## 2017-09-09 DIAGNOSIS — I69398 Other sequelae of cerebral infarction: Secondary | ICD-10-CM | POA: Diagnosis not present

## 2017-09-09 DIAGNOSIS — M7989 Other specified soft tissue disorders: Secondary | ICD-10-CM | POA: Diagnosis not present

## 2017-09-09 DIAGNOSIS — I69351 Hemiplegia and hemiparesis following cerebral infarction affecting right dominant side: Secondary | ICD-10-CM

## 2017-09-09 DIAGNOSIS — K30 Functional dyspepsia: Secondary | ICD-10-CM | POA: Diagnosis present

## 2017-09-09 DIAGNOSIS — E785 Hyperlipidemia, unspecified: Secondary | ICD-10-CM | POA: Diagnosis not present

## 2017-09-09 DIAGNOSIS — Z79899 Other long term (current) drug therapy: Secondary | ICD-10-CM | POA: Diagnosis not present

## 2017-09-09 DIAGNOSIS — D72829 Elevated white blood cell count, unspecified: Secondary | ICD-10-CM | POA: Diagnosis present

## 2017-09-09 DIAGNOSIS — I2510 Atherosclerotic heart disease of native coronary artery without angina pectoris: Secondary | ICD-10-CM | POA: Diagnosis present

## 2017-09-09 DIAGNOSIS — I69151 Hemiplegia and hemiparesis following nontraumatic intracerebral hemorrhage affecting right dominant side: Principal | ICD-10-CM

## 2017-09-09 DIAGNOSIS — M25461 Effusion, right knee: Secondary | ICD-10-CM | POA: Diagnosis present

## 2017-09-09 DIAGNOSIS — I69118 Other symptoms and signs involving cognitive functions following nontraumatic intracerebral hemorrhage: Secondary | ICD-10-CM | POA: Diagnosis not present

## 2017-09-09 DIAGNOSIS — I69190 Apraxia following nontraumatic intracerebral hemorrhage: Secondary | ICD-10-CM | POA: Diagnosis not present

## 2017-09-09 DIAGNOSIS — R269 Unspecified abnormalities of gait and mobility: Secondary | ICD-10-CM

## 2017-09-09 DIAGNOSIS — Z951 Presence of aortocoronary bypass graft: Secondary | ICD-10-CM | POA: Diagnosis not present

## 2017-09-09 DIAGNOSIS — R059 Cough, unspecified: Secondary | ICD-10-CM

## 2017-09-09 DIAGNOSIS — I612 Nontraumatic intracerebral hemorrhage in hemisphere, unspecified: Secondary | ICD-10-CM | POA: Diagnosis not present

## 2017-09-09 DIAGNOSIS — L22 Diaper dermatitis: Secondary | ICD-10-CM | POA: Diagnosis not present

## 2017-09-09 DIAGNOSIS — J449 Chronic obstructive pulmonary disease, unspecified: Secondary | ICD-10-CM | POA: Diagnosis present

## 2017-09-09 DIAGNOSIS — F17210 Nicotine dependence, cigarettes, uncomplicated: Secondary | ICD-10-CM | POA: Diagnosis present

## 2017-09-09 DIAGNOSIS — Z8249 Family history of ischemic heart disease and other diseases of the circulatory system: Secondary | ICD-10-CM

## 2017-09-09 DIAGNOSIS — I10 Essential (primary) hypertension: Secondary | ICD-10-CM | POA: Diagnosis not present

## 2017-09-09 DIAGNOSIS — I611 Nontraumatic intracerebral hemorrhage in hemisphere, cortical: Secondary | ICD-10-CM | POA: Diagnosis not present

## 2017-09-09 DIAGNOSIS — I619 Nontraumatic intracerebral hemorrhage, unspecified: Secondary | ICD-10-CM | POA: Diagnosis not present

## 2017-09-09 DIAGNOSIS — R05 Cough: Secondary | ICD-10-CM

## 2017-09-09 DIAGNOSIS — M25561 Pain in right knee: Secondary | ICD-10-CM | POA: Diagnosis present

## 2017-09-09 DIAGNOSIS — Z7951 Long term (current) use of inhaled steroids: Secondary | ICD-10-CM | POA: Diagnosis not present

## 2017-09-09 DIAGNOSIS — R69 Illness, unspecified: Secondary | ICD-10-CM | POA: Diagnosis not present

## 2017-09-09 DIAGNOSIS — I25810 Atherosclerosis of coronary artery bypass graft(s) without angina pectoris: Secondary | ICD-10-CM | POA: Diagnosis not present

## 2017-09-09 DIAGNOSIS — S8991XA Unspecified injury of right lower leg, initial encounter: Secondary | ICD-10-CM | POA: Diagnosis not present

## 2017-09-09 DIAGNOSIS — G8110 Spastic hemiplegia affecting unspecified side: Secondary | ICD-10-CM | POA: Diagnosis not present

## 2017-09-09 LAB — COMPREHENSIVE METABOLIC PANEL
ALK PHOS: 87 U/L (ref 38–126)
ALT: 61 U/L (ref 17–63)
ANION GAP: 9 (ref 5–15)
AST: 39 U/L (ref 15–41)
Albumin: 2.7 g/dL — ABNORMAL LOW (ref 3.5–5.0)
BILIRUBIN TOTAL: 1 mg/dL (ref 0.3–1.2)
BUN: 21 mg/dL — ABNORMAL HIGH (ref 6–20)
CALCIUM: 8.6 mg/dL — AB (ref 8.9–10.3)
CO2: 24 mmol/L (ref 22–32)
CREATININE: 0.88 mg/dL (ref 0.61–1.24)
Chloride: 94 mmol/L — ABNORMAL LOW (ref 101–111)
Glucose, Bld: 131 mg/dL — ABNORMAL HIGH (ref 65–99)
Potassium: 3.7 mmol/L (ref 3.5–5.1)
Sodium: 127 mmol/L — ABNORMAL LOW (ref 135–145)
TOTAL PROTEIN: 6.2 g/dL — AB (ref 6.5–8.1)

## 2017-09-09 LAB — CBC WITH DIFFERENTIAL/PLATELET
BASOS PCT: 0 %
Basophils Absolute: 0 10*3/uL (ref 0.0–0.1)
EOS ABS: 0.4 10*3/uL (ref 0.0–0.7)
EOS PCT: 3 %
HEMATOCRIT: 41.9 % (ref 39.0–52.0)
HEMOGLOBIN: 14.9 g/dL (ref 13.0–17.0)
LYMPHS PCT: 11 %
Lymphs Abs: 1.4 10*3/uL (ref 0.7–4.0)
MCH: 32.3 pg (ref 26.0–34.0)
MCHC: 35.6 g/dL (ref 30.0–36.0)
MCV: 90.7 fL (ref 78.0–100.0)
MONOS PCT: 15 %
Monocytes Absolute: 1.9 10*3/uL — ABNORMAL HIGH (ref 0.1–1.0)
NEUTROS ABS: 9.2 10*3/uL — AB (ref 1.7–7.7)
Neutrophils Relative %: 71 %
Platelets: 279 10*3/uL (ref 150–400)
RBC: 4.62 MIL/uL (ref 4.22–5.81)
RDW: 13.3 % (ref 11.5–15.5)
WBC: 12.9 10*3/uL — ABNORMAL HIGH (ref 4.0–10.5)

## 2017-09-09 MED ORDER — ONDANSETRON HCL 4 MG/2ML IJ SOLN: 4.0000 mg | Freq: Four times a day (QID) | INTRAMUSCULAR | Status: DC | PRN

## 2017-09-09 MED ORDER — SORBITOL 70 % SOLN: 30.0000 mL | Freq: Every day | Status: DC | PRN

## 2017-09-09 MED ORDER — BACLOFEN 5 MG HALF TABLET
5.0000 mg | ORAL_TABLET | Freq: Three times a day (TID) | ORAL | Status: DC
  Administered 2017-09-09 – 2017-09-10 (×2): 5 mg via ORAL
  Filled 2017-09-09 (×2): qty 1

## 2017-09-09 MED ORDER — ENSURE ENLIVE PO LIQD
237.0000 mL | Freq: Two times a day (BID) | ORAL | Status: DC
  Administered 2017-09-10 – 2017-10-16 (×57): 237 mL via ORAL

## 2017-09-09 MED ORDER — VITAMIN B-12 1000 MCG PO TABS
1000.0000 ug | ORAL_TABLET | Freq: Every day | ORAL | Status: DC
  Administered 2017-09-10 – 2017-10-16 (×37): 1000 ug via ORAL
  Filled 2017-09-09 (×37): qty 1

## 2017-09-09 MED ORDER — SENNOSIDES-DOCUSATE SODIUM 8.6-50 MG PO TABS
1.0000 | ORAL_TABLET | Freq: Two times a day (BID) | ORAL | Status: DC
  Administered 2017-09-09 – 2017-09-13 (×8): 1 via ORAL
  Filled 2017-09-09 (×8): qty 1

## 2017-09-09 MED ORDER — ALBUTEROL SULFATE (2.5 MG/3ML) 0.083% IN NEBU
3.0000 mL | INHALATION_SOLUTION | RESPIRATORY_TRACT | Status: DC | PRN
Start: 2017-09-09 — End: 2017-10-16
  Filled 2017-09-09: qty 3

## 2017-09-09 MED ORDER — METOPROLOL TARTRATE 25 MG PO TABS
25.0000 mg | ORAL_TABLET | Freq: Every day | ORAL | Status: DC
  Administered 2017-09-10 – 2017-10-13 (×34): 25 mg via ORAL
  Filled 2017-09-09 (×34): qty 1

## 2017-09-09 MED ORDER — MOMETASONE FURO-FORMOTEROL FUM 200-5 MCG/ACT IN AERO
2.0000 | INHALATION_SPRAY | Freq: Two times a day (BID) | RESPIRATORY_TRACT | Status: DC
  Administered 2017-09-09 – 2017-10-16 (×72): 2 via RESPIRATORY_TRACT
  Filled 2017-09-09 (×3): qty 8.8

## 2017-09-09 MED ORDER — ONDANSETRON HCL 4 MG PO TABS: 4.0000 mg | ORAL_TABLET | Freq: Four times a day (QID) | ORAL | Status: DC | PRN

## 2017-09-09 MED ORDER — PANTOPRAZOLE SODIUM 40 MG PO TBEC
40.0000 mg | DELAYED_RELEASE_TABLET | Freq: Every day | ORAL | Status: DC
  Administered 2017-09-09 – 2017-10-15 (×37): 40 mg via ORAL
  Filled 2017-09-09 (×37): qty 1

## 2017-09-09 MED ORDER — NITROGLYCERIN 0.4 MG SL SUBL
0.4000 mg | SUBLINGUAL_TABLET | SUBLINGUAL | Status: DC | PRN
Start: 2017-09-09 — End: 2017-10-16

## 2017-09-09 MED ORDER — ACETAMINOPHEN 325 MG PO TABS
650.0000 mg | ORAL_TABLET | ORAL | Status: DC | PRN
  Administered 2017-09-30 – 2017-10-16 (×27): 650 mg via ORAL
  Filled 2017-09-09 (×29): qty 2

## 2017-09-09 NOTE — Progress Notes (Signed)
Physical Therapy Treatment Patient Details Name: Jaime FuelRichard A Reagor MRN: [id number] DOB: 1946-03-20 Today's Date: 09/09/2017    History of Present Illness 71 y.o. male admitted on 09/01/17 for R sided weakness with CT revealing a L parietal lobe intraparenchymal hematoma, small volume SAH in adjacent L parietal lobe as well as R frontal lobe.  Pt with significant PMH of HTN, DOE, CAD, and CABG.      PT Comments    Pt progressing towards goals. Worked on standing trials using stedy this session and able to maintain standing for short bouts. Able to achieve midline orientation this session with supervision and verbal cues this session. Required max A +2 and use of steady for basic transfers to chair this session. RN present in room and monitored oxygen sats while on RA during mobility and sats from 90-91% on RA. Continue to feel pt is an excellent candidate for CIR at d/c to increase independence and safety with functional mobility and to decrease burden of care on spouse. Will continue to follow acutely and progress mobility activities.   Follow Up Recommendations  CIR     Equipment Recommendations  3in1 (PT);Rolling walker with 5" wheels;Other (comment)    Recommendations for Other Services Speech consult     Precautions / Restrictions Precautions Precautions: Fall Restrictions Weight Bearing Restrictions: No    Mobility  Bed Mobility Overal bed mobility: Needs Assistance Bed Mobility: Supine to Sit     Supine to sit: Mod assist     General bed mobility comments: Mod A for trunk elevation.   Transfers Overall transfer level: Needs assistance   Transfers: Sit to/from Stand Sit to Stand: Max assist;+2 physical assistance         General transfer comment: Used stedy to perform sit<>stand X 2 and to transfer to chair this session. Max A +2 to stand, however, able to achieve full upright positioning with use of stedy this session. Decreased right lateral lean initially,  however, when fatigued, noted increased R lateral lean in standing. Able to maintain standing for short bouts and UE and external support.   Ambulation/Gait                 Stairs            Wheelchair Mobility    Modified Rankin (Stroke Patients Only) Modified Rankin (Stroke Patients Only) Pre-Morbid Rankin Score: No symptoms Modified Rankin: Severe disability     Balance Overall balance assessment: Needs assistance Sitting-balance support: Feet supported;Single extremity supported Sitting balance-Leahy Scale: Fair Sitting balance - Comments: Able to achieve midline orientation with supervision. Noted some increased R lateral lean with fatigue, however, with cues able to achieve midline. Working on increasing WB on RUE. Required manual assist to maintain positioning of RUE.    Standing balance support: During functional activity;Bilateral upper extremity supported Standing balance-Leahy Scale: Poor Standing balance comment: Required BUE to maintain upright posture. Assist to maintain RUE on stedy.                             Cognition Arousal/Alertness: Awake/alert Behavior During Therapy: Flat affect Overall Cognitive Status: Impaired/Different from baseline Area of Impairment: Attention;Following commands;Safety/judgement;Awareness;Problem solving                   Current Attention Level: Selective   Following Commands: Follows one step commands with increased time Safety/Judgement: Decreased awareness of safety;Decreased awareness of deficits Awareness: Emergent Problem Solving: Slow processing;Difficulty sequencing;Requires  verbal cues;Requires tactile cues General Comments: Continues to show improvement in motor planning.       Exercises      General Comments        Pertinent Vitals/Pain Pain Assessment: Faces Faces Pain Scale: Hurts little more Pain Location: knees Pain Descriptors / Indicators: Discomfort;Grimacing Pain  Intervention(s): Limited activity within patient's tolerance;Monitored during session;Repositioned    Home Living                      Prior Function            PT Goals (current goals can now be found in the care plan section) Acute Rehab PT Goals Patient Stated Goal: to work hard and rehab hard so he can get better PT Goal Formulation: With patient/family Time For Goal Achievement: 09/16/17 Potential to Achieve Goals: Good Progress towards PT goals: Progressing toward goals    Frequency    Min 4X/week      PT Plan Current plan remains appropriate    Co-evaluation              AM-PAC PT "6 Clicks" Daily Activity  Outcome Measure  Difficulty turning over in bed (including adjusting bedclothes, sheets and blankets)?: Unable Difficulty moving from lying on back to sitting on the side of the bed? : Unable Difficulty sitting down on and standing up from a chair with arms (e.g., wheelchair, bedside commode, etc,.)?: Unable Help needed moving to and from a bed to chair (including a wheelchair)?: A Lot Help needed walking in hospital room?: Total Help needed climbing 3-5 steps with a railing? : Total 6 Click Score: 7    End of Session Equipment Utilized During Treatment: Gait belt Activity Tolerance: Patient tolerated treatment well Patient left: in chair;with call bell/phone within reach;with chair alarm set;with nursing/sitter in room Nurse Communication: Mobility status;Other (comment) (use of stedy for return to bed ) PT Visit Diagnosis: Other symptoms and signs involving the nervous system (R29.898);Difficulty in walking, not elsewhere classified (R26.2);Hemiplegia and hemiparesis Hemiplegia - Right/Left: Right Hemiplegia - dominant/non-dominant: Dominant Hemiplegia - caused by: Nontraumatic intracerebral hemorrhage;Nontraumatic [facility]     Time: 4401-0272 PT Time Calculation (min) (ACUTE ONLY): 27 min  Charges:  $Therapeutic Activity: 23-37 mins                     G Codes:       Gladys Damme, PT, DPT  Acute Rehabilitation Services  Pager: [phone]    Lehman Prom 09/09/2017, 2:39 PM

## 2017-09-09 NOTE — Progress Notes (Signed)
Standley Brooking, RN Rehab Admission Coordinator Signed Physical Medicine and Rehabilitation  PMR Pre-admission Date of Service: 09/06/2017 3:28 PM  Related encounter: ED to Hosp-Admission (Current) from 09/01/2017 in [facility]       [] Hide copied text PMR Admission Coordinator Pre-Admission Assessment  Patient: Jaime Pierce is an 71 y.o., male MRN: [id number] DOB: Feb 16, 1946 Height: 6' (182.9 cm) Weight: 85.1 kg (187 lb 9.8 oz)                                                                                                                                                  Insurance Information HMO:     PPO: yes     PCP:      IPA:      80/20:      OTHER: no HMO PRIMARY: [employer] Medicare      Policy#: [id number]      Subscriber: pt CM Name: Byrd Hesselbach Phone#: [phone]     Fax#: [phone] Pre-Cert#: [id number]   Approved for 5 days with updates due 10/22. Originally denied and approved after peer to peer and expedited appeal process   Employer: retired Benefits:  Phone #: [phone]     Name: 09/06/2017 Eff. Date: 11/23/2016     Deduct: none      Out of Pocket Max: $4500      Life Max: none CIR: $250 co pay per day days 1-6 then insurance covers 100%      SNF: no co pay days 1 through 20; $164 co pay per day days 21-100 Outpatient: $40 co pay per visit     Co-Pay: visits per medical neccesity Home Health: 100%      Co-Pay: visits per medical neccesity DME: 80%     Co-Pay: 20% Providers: in network  SECONDARY: none        Medicaid Application Date:       Case Manager:  Disability Application Date:       Case Worker:   Emergency Actuary Information    Name Relation Home Work Mobile   [place] Spouse [phone]       Current Medical History  Patient Admitting Diagnosis: 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 Blood pressure 170/90. CT/MRI showed a 4.2 x 2.5 x 3.1 cm left parietal lobe intraparenchymal hematoma. Small-volume acute subarachnoid hemorrhage was in the adjacent left parietal lobe as well as at the anterior right frontal lobe. CT angiogram head and neck with no evidence of aneurysm or vascular malformation. Follow-up neurology services with conservative care. Echocardiogram with ejection fraction  of 55% no wall motion abnormalities. Follow-up cranial CT scan 09/02/2017 showed a mild interval increase in size of left parietal lobe intraparenchymal hematoma measuring 4.7 x 5.4 x 4.9 cm. No other acute intracranial abnormality. Tolerating a regular consistency diet. Increased tone in the RUE and RLE. Began Baclofen 5 mg  3 times a day.  .Total: 9 NIHSS  Past Medical History  Past Medical History:  Diagnosis Date  . Coronary artery disease   . Dyspnea on exertion   . Hyperlipidemia   . Hypertension   . Tobacco abuse     Family History  family history includes CVA in his mother; Heart disease in his father and maternal grandfather.  Prior Rehab/Hospitalizations:  Has the patient had major surgery during 100 days prior to admission? No  Current Medications   Current Facility-Administered Medications:  .   stroke: mapping our early stages of recovery book, , Does not apply, Once, Caryl PinaLindzen, Eric, MD .  acetaminophen (TYLENOL) tablet 650 mg, 650 mg, Oral, Q4H PRN, 650 mg at 09/07/17 2218 **OR** [DISCONTINUED] acetaminophen (TYLENOL) solution 650 mg, 650 mg, Per Tube, Q4H PRN **OR** [DISCONTINUED] acetaminophen (TYLENOL) suppository 650 mg, 650 mg, Rectal, Q4H PRN, Caryl PinaLindzen, Eric, MD .  albuterol (PROVENTIL) (2.5 MG/3ML) 0.083% nebulizer solution 3 mL, 3 mL, Inhalation, Q4H PRN, Caryl PinaLindzen, Eric, MD .  baclofen (LIORESAL)  tablet 5 mg, 5 mg, Oral, TID, Marvel PlanXu, Jindong, MD, Stopped at 09/09/17 1112 .  labetalol (NORMODYNE,TRANDATE) injection 5-10 mg, 5-10 mg, Intravenous, Q2H PRN, Marvel PlanXu, Jindong, MD .  metoprolol tartrate (LOPRESSOR) tablet 25 mg, 25 mg, Oral, Daily, Marvel PlanXu, Jindong, MD, 25 mg at 09/09/17 1022 .  mometasone-formoterol (DULERA) 200-5 MCG/ACT inhaler 2 puff, 2 puff, Inhalation, BID, Caryl PinaLindzen, Eric, MD, 2 puff at 09/09/17 0907 .  nitroGLYCERIN (NITROSTAT) SL tablet 0.4 mg, 0.4 mg, Sublingual, Q5 min PRN, Caryl PinaLindzen, Eric, MD .  pantoprazole (PROTONIX) EC tablet 40 mg, 40 mg, Oral, Daily, Caryl PinaLindzen, Eric, MD, 40 mg at 09/08/17 2247 .  senna-docusate (Senokot-S) tablet 1 tablet, 1 tablet, Oral, BID, Caryl PinaLindzen, Eric, MD, 1 tablet at 09/09/17 1022 .  vitamin B-12 (CYANOCOBALAMIN) tablet 1,000 mcg, 1,000 mcg, Oral, Daily, Marvel PlanXu, Jindong, MD, 1,000 mcg at 09/09/17 1022  Patients Current Diet: Diet Heart Room service appropriate? Yes; Fluid consistency: Thin  Precautions / Restrictions Precautions Precautions: Fall Precaution Comments: right sided weakness, right inattention Restrictions Weight Bearing Restrictions: No   Has the patient had 2 or more falls or a fall with injury in the past year?No  Prior Activity Level Community (5-7x/wk): Independent pta without AD; drives, retired  Journalist, newspaperHome Assistive Devices / Corporate investment bankerquipment Home Assistive Devices/Equipment: None Home Equipment: Information systems managerhower seat - built in  Prior Device Use: Indicate devices/aids used by the patient prior to current illness, exacerbation or injury? None of the above  Prior Functional Level Prior Function Level of Independence: Independent Comments: still drives, retired  Self Care: Did the patient need help bathing, dressing, using the toilet or eating?  Independent  Indoor Mobility: Did the patient need assistance with walking from room to room (with or without device)? Independent  Stairs: Did the patient need assistance with internal or  external stairs (with or without device)? Independent  Functional Cognition: Did the patient need help planning regular tasks such as shopping or remembering to take medications? Independent  Current Functional Level Cognition  Arousal/Alertness: Awake/alert Overall Cognitive Status: Impaired/Different from baseline Current Attention Level: Selective Orientation Level: Oriented X4 Following Commands: Follows one step commands with increased time Safety/Judgement: Decreased  awareness of safety, Decreased awareness of deficits General Comments: Continues to show improvement in motor planning.  Attention: Sustained Sustained Attention: Appears intact Memory: Impaired Memory Impairment: Retrieval deficit Awareness: Appears intact Problem Solving: Appears intact Executive Function: Reasoning, Initiating, Self Correcting, Self Monitoring Reasoning: Appears intact Initiating: Appears intact Self Monitoring: Appears intact Self Correcting: Appears intact Safety/Judgment: Appears intact    Extremity Assessment (includes Sensation/Coordination)  Upper Extremity Assessment: RUE deficits/detail RUE Deficits / Details: Trace biceps/triceps noted; otherwise no active movement noted. Full PROM. Pt reports numbness thorughout RUE RUE Sensation: decreased light touch RUE Coordination: decreased fine motor, decreased gross motor  Lower Extremity Assessment: Defer to PT evaluation RLE Deficits / Details: ankle DF 3-/5, PF3-/5, knee extension 3-/5, hip flexion 3-/5.  Also, some periods of increased flexion tone in sitting EOB as PT tried to get his right leg positioned better, seemingly a pain response as it was uncomfortable for me to touch his leg.  He did better given the opportunity to actively move it to reposition.   RLE Sensation: decreased light touch RLE Coordination: decreased gross motor    ADLs  Overall ADL's : Needs assistance/impaired Eating/Feeding: Minimal  assistance Eating/Feeding Details (indicate cue type and reason): per wife, pt feeding self wtih set up Grooming: Minimal assistance Grooming Details (indicate cue type and reason): Pt using L non-dominant hand for oral care. Perseveration during oral care. Difficulty sequencing. Pt able to comb hair during unsuported sitting. Upper Body Bathing: Moderate assistance Lower Body Bathing: Moderate assistance Lower Body Bathing Details (indicate cue type and reason): Pt able to lean forward and simulate washing BLE in sitting. Able to lean toward R side to reach L hip. Upper Body Dressing : Moderate assistance Lower Body Dressing: Maximal assistance Toilet Transfer: Moderate assistance, +2 for physical assistance, Stand-pivot Toilet Transfer Details (indicate cue type and reason): Simulated by transfer EOB>chair Functional mobility during ADLs: Moderate assistance General ADL Comments: R knee blocked and facilitation given through R thigh and L hip to facilitate sit - stand. Tactile cues to facilitate upright posture. Pt able to maintain midline postural control in standing x 5 min with weight shifting R/L while blocking R knee. Pt able to correct posture with vc only at times. AFter facilitation, Pt able to maintain standing with min A at times.     Mobility  Overal bed mobility: Needs Assistance Bed Mobility: Supine to Sit Rolling: Min assist Sidelying to sit: Mod assist Supine to sit: Mod assist Sit to sidelying: Mod assist, +2 for physical assistance General bed mobility comments: Mod A for trunk elevation.     Transfers  Overall transfer level: Needs assistance Equipment used:  (2 person lift with gait belt and chuck pad) Transfer via Lift Equipment: Stedy Transfers: Sit to/from Stand Sit to Stand: Max assist, +2 physical assistance Stand pivot transfers: Max assist, +2 physical assistance General transfer comment: Used stedy to perform sit<>stand X 2 and to transfer to chair this  session. Max A +2 to stand, however, able to achieve full upright positioning with use of stedy this session. Decreased right lateral lean initially, however, when fatigued, noted increased R lateral lean in standing. Able to maintain standing for short bouts and UE and external support.     Ambulation / Gait / Stairs / Wheelchair Mobility  Ambulation/Gait General Gait Details: no appropriate at this time    Posture / Balance Dynamic Sitting Balance Sitting balance - Comments: Able to achieve midline orientation with supervision. Noted some increased R lateral  lean with fatigue, however, with cues able to achieve midline. Working on increasing WB on RUE. Required manual assist to maintain positioning of RUE.  Balance Overall balance assessment: Needs assistance Sitting-balance support: Feet supported, Single extremity supported Sitting balance-Leahy Scale: Fair Sitting balance - Comments: Able to achieve midline orientation with supervision. Noted some increased R lateral lean with fatigue, however, with cues able to achieve midline. Working on increasing WB on RUE. Required manual assist to maintain positioning of RUE.  Postural control: Right lateral lean Standing balance support: During functional activity, Bilateral upper extremity supported Standing balance-Leahy Scale: Poor Standing balance comment: Required BUE to maintain upright posture. Assist to maintain RUE on stedy.     Special needs/care consideration BiPAP/CPAP  N/a CPM  N/a Continuous Drip IV  N/a Dialysis  N/a Life Vest  N/a Oxygen  N/a Special Bed  N/a Trach Size  N/a Wound Vac n/a Skin  intact Bowel mgmt: continent  Bladder mgmt: continent Diabetic mgmt Hgb A1c 5.9 Smoker   Previous Home Environment Living Arrangements: Spouse/significant other  Lives With: Spouse Available Help at Discharge: Family, Available 24 hours/day Type of Home: House Home Layout: One level Home Access: Stairs to enter Entrance  Stairs-Rails: None Secretary/administrator of Steps: 2 Bathroom Shower/Tub: Health visitor: Pharmacist, community: Yes How Accessible: Accessible via walker Home Care Services: No Additional Comments: liked to play golf  Discharge Living Setting Plans for Discharge Living Setting: Patient's home, Lives with (comment) (wife) Type of Home at Discharge: House Discharge Home Layout: One level Discharge Home Access: Stairs to enter Entrance Stairs-Rails: None Entrance Stairs-Number of Steps: 2 Discharge Bathroom Shower/Tub: Walk-in shower Discharge Bathroom Toilet: Standard Discharge Bathroom Accessibility: Yes How Accessible: Accessible via walker Does the patient have any problems obtaining your medications?: No  Social/Family/Support Systems Patient Roles: Spouse Contact Information: Lupita Leash, wife Anticipated Caregiver: wife Anticipated Caregiver's Contact Information: see above Ability/Limitations of Caregiver: no limitations Caregiver Availability: 24/7 Discharge Plan Discussed with Primary Caregiver: Yes Is Caregiver In Agreement with Plan?: Yes Does Caregiver/Family have Issues with Lodging/Transportation while Pt is in Rehab?: No (wife stays with pt 24/7 in hospital)  Goals/Additional Needs Patient/Family Goal for Rehab: supervision to min assist with PT, OT, and SLP  Expected length of stay: [facility]OS 19- 22 days Pt/Family Agrees to Admission and willing to participate: Yes Program Orientation Provided & Reviewed with Pt/Caregiver Including Roles  & Responsibilities: Yes  Decrease burden of Care through IP rehab admission: n/a  Possible need for SNF placement upon discharge: not anticipated  Patient Condition: This patient's medical and functional status has changed since the consult dated: 09/02/2017 in which the Rehabilitation Physician determined and documented that the patient's condition is appropriate for intensive rehabilitative care in an  inpatient rehabilitation facility. See "History of Present Illness" (above) for medical update. Functional changes are: overall mod to max assist. Patient's medical and functional status update has been discussed with the Rehabilitation physician and patient remains appropriate for inpatient rehabilitation. Will admit to inpatient rehab today.  Preadmission Screen Completed By:  Clois Dupes, 09/09/2017 4:47 PM ______________________________________________________________________   Discussed status with Dr. Wynn Banker on 09/09/2017 at  1646 and received telephone approval for admission today.  Admission Coordinator:  Clois Dupes, time 1610 Date 09/09/2017       Cosigned by: Erick Colace, MD at 09/09/2017 6:12 PM  Revision History

## 2017-09-09 NOTE — Progress Notes (Signed)
Report given to receiving Nurse Michelle NasutiElena for [employer] Petrucell[employer].

## 2017-09-09 NOTE — H&P (Signed)
Physical Medicine and Rehabilitation Admission H&P       Chief Complaint  Patient presents with  . Code Stroke  : HPI: Jaime Pierce a 71 y.o.right handed malewith history of CAD with CABG, hypertension, tobacco abuse. Per chart review patient lives with spouse. Independent and active. Wife is also retired. One level home 2 steps to entry. Presented 09/01/2017 with acute onset of right sided weakness and facial droop. Blood pressure 170/90. CT/MRI showed a 4.2 x 2.5 x 3.1 cm left parietal lobe intraparenchymal hematoma. Small-volume acute subarachnoid hemorrhage was in the adjacent left parietal lobe as well as at the anterior right frontal lobe. CT angiogram head and neck with no evidence of aneurysm or vascular malformation. Follow-up neurology services with conservative care. Echocardiogram with ejection fraction of 55% no wall motion abnormalities. Follow-up cranial CT scan 09/02/2017 showed a mild interval increase in size of left parietal lobe intraparenchymal hematoma measuring 4.7 x 5.4 x 4.9 cm. No other acute intracranial abnormality. Tolerating a regular consistency diet. Physical and occupationaltherapy evaluations completed with recommendations of physical medicine rehabilitation consult.Patient was admitted for a comprehensive rehabilitation program  Review of Systems  Constitutional: Negative for chills and fever.  HENT: Negative for hearing loss.   Eyes: Negative for blurred vision and double vision.  Respiratory: Positive for shortness of breath.   Cardiovascular: Negative for chest pain, palpitations and leg swelling.  Gastrointestinal: Positive for constipation. Negative for nausea and vomiting.  Musculoskeletal: Positive for joint pain and myalgias.  Skin: Negative for rash.  Neurological: Positive for dizziness and weakness. Negative for seizures.  All other systems reviewed and are negative.      Past Medical History:  Diagnosis Date  . Coronary artery  disease   . Dyspnea on exertion   . Hyperlipidemia   . Hypertension   . Tobacco abuse         Past Surgical History:  Procedure Laterality Date  . CORONARY ARTERY BYPASS GRAFT          Family History  Problem Relation Age of Onset  . Heart disease Father   . CVA Mother   . Heart disease Maternal Grandfather    Social History:  reports that he has been smoking Cigarettes.  He has a 25.00 pack-year smoking history. He has never used smokeless tobacco. He reports that he drinks alcohol. He reports that he does not use drugs. Allergies:  Allergies  Allergen Reactions  . Sulfonamide Derivatives          Medications Prior to Admission  Medication Sig Dispense Refill  . albuterol (PROAIR HFA) 108 (90 Base) MCG/ACT inhaler 2 puffs up to every 4 hours if can't catch your breath (Patient taking differently: Inhale 1-2 puffs into the lungs every 4 (four) hours as needed for wheezing or shortness of breath. ) 1 Inhaler 11  . atorvastatin (LIPITOR) 40 MG tablet Take 1 tablet (40 mg total) by mouth daily. 90 tablet 3  . budesonide-formoterol (SYMBICORT) 160-4.5 MCG/ACT inhaler Take 2 puffs first thing in am and then another 2 puffs about 12 hours later. (Patient taking differently: Inhale 2 puffs into the lungs 2 (two) times daily. ) 1 Inhaler 0  . cetirizine (ZYRTEC) 10 MG tablet Take 10 mg by mouth daily.    . irbesartan (AVAPRO) 150 MG tablet Take 1 tablet (150 mg total) by mouth daily. 90 tablet 3  . metoprolol (LOPRESSOR) 50 MG tablet Take 0.5 tablets (25 mg total) by mouth 2 (two) times daily. [place]  tablet 3  . nitroGLYCERIN (NITROSTAT) 0.4 MG SL tablet Place 1 tablet (0.4 mg total) under the tongue every 5 (five) minutes as needed for chest pain. 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  HOB elevated to ~35 degrees Transfers Overall transfer level: Needs assistance Equipment used: 2 person hand held assist Transfers: Sit to/from Stand, Stand Pivot Transfers Sit to Stand: +2 physical assistance, Mod assist Stand pivot transfers: +2 physical assistance, Mod assist General transfer comment: Two person heavy mod assist to stand EOB with right knee blocked (it does buckle when block loosened).  Assist needed to weight shift left and ID that he needs to weight shift left in standing.  Heavy mod assist to pivot on his left stronger foot while blocking and helping to progress his right foot around to the chair.  support at trunk and continued right lateral lean statically and more so dynamically  Ambulation/Gait General Gait Details: not ready to attempt today.   ADL: ADL Overall ADL's : Needs assistance/impaired Eating/Feeding: Moderate assistance, Sitting Grooming: Moderate assistance, Sitting Upper Body Bathing: Maximal assistance, Sitting Lower Body Bathing: Maximal assistance, Sit to/from stand Upper Body Dressing : Moderate assistance, Sitting Lower  Body Dressing: Maximal assistance, Sit to/from stand Toilet Transfer: Moderate assistance, +2 for physical assistance, Stand-pivot Toilet Transfer Details (indicate cue type and reason): Simulated by transfer EOB>chair Functional mobility during ADLs: Moderate assistance, +2 for physical assistance  Cognition: Cognition Overall Cognitive Status: Impaired/Different from baseline Arousal/Alertness: Awake/alert Orientation Level: Oriented X4 Attention: Sustained Sustained Attention: Appears intact Memory: Impaired Memory Impairment: Retrieval deficit Awareness: Appears intact Problem Solving: Appears intact Executive Function: Reasoning, Initiating, Self Correcting, Self Monitoring Reasoning: Appears intact Initiating: Appears intact Self Monitoring: Appears intact Self Correcting: Appears intact Safety/Judgment: Appears intact Cognition Arousal/Alertness: Awake/alert Behavior During Therapy:  (tearful, appropriately so) Overall Cognitive Status: Impaired/Different from baseline Area of Impairment: Attention, Following commands, Safety/judgement, Awareness, Problem solving Current Attention Level: Selective Following Commands: Follows one step commands consistently, Follows multi-step commands inconsistently Safety/Judgement: Decreased awareness of deficits Awareness: Emergent Problem Solving: Difficulty sequencing, Requires verbal cues, Requires tactile cues General Comments: Some mild cognitive deficits related to awareness noted, and higher level activities (for instance he got step forward with your left foot incorrect two times in a row on two consecutie stands), not aware of his lateral lean at times.   Physical Exam: Blood pressure 125/70, pulse 81, temperature 97.6 F (36.4 C), temperature source Oral, resp. rate 16, height 6' (1.829 m), weight 85.1 kg (187 lb 9.8 oz), SpO2 96 %. Physical Exam  Vitals reviewed. Constitutional: He appears well-developed.  HENT:  Head:  Normocephalic.  Eyes: EOM are normal.  Neck: Normal range of motion. Neck supple. No thyromegaly present.  Cardiovascular: Normal rate, regular rhythm and normal heart sounds.   Respiratory: Effort normal and breath sounds normal. No respiratory distress.  GI: Soft. Bowel sounds are normal. He exhibits no distension.  Skin.warm and dry Neurological: He is alertand oriented to person, place, and time.   He followed commands. Fair awareness of deficits evidence of ipsilateral motor apraxia, left upper extremity. This limits Manual muscle testing, grossly 5/5 in the left deltoid, biceps, triceps, grip, hip flexor, knee extensor, wrist flexor Right upper extremity 0/5 in the deltoid, biceps, triceps, finger flexors and extensors, 2- hip/ knee extensor synergy right lower extremity. Sensation absent to light touch in the right upper and right lower limb. He has intact pinch sensation in the right toes, but absent at the right fingers. Intact sensation on the left side Oriented to person, place, year, month, but not day and date. visual fields are intact confrontation testing. No evidence of right neglect     Lab Results Last 48 Hours        Results for orders placed or performed during the hospital encounter of 09/01/17 (from the past 48 hour(s))  MRSA PCR Screening     Status: None   Collection Time: 09/01/17  5:55 AM  Result Value Ref Range   MRSA by PCR NEGATIVE NEGATIVE    Comment:        The GeneXpert MRSA Assay (FDA approved for NASAL specimens only), is one component of a comprehensive MRSA colonization surveillance program. It is not intended to diagnose MRSA infection nor to guide or monitor treatment for MRSA infections.   CBC     Status: Abnormal   Collection Time: 09/02/17  3:14 AM  Result Value Ref Range   WBC 12.1 (H) 4.0 - 10.5 K/uL   RBC 5.01 4.22 - 5.81 MIL/uL   Hemoglobin 15.7 13.0 - 17.0 g/dL   HCT 46.5 39.0 - 52.0 %   MCV 92.8 78.0 - 100.0 fL    MCH 31.3 26.0 - 34.0 pg   MCHC 33.8 30.0 - 36.0 g/dL   RDW 14.6 11.5 - 15.5 %   Platelets 210 150 - 400 K/uL  Basic metabolic panel     Status: None   Collection Time: 09/02/17  3:14 AM  Result Value Ref Range   Sodium 139 135 - 145 mmol/L   Potassium 4.1 3.5 - 5.1 mmol/L   Chloride 107 101 - 111 mmol/L   CO2 23 22 - 32 mmol/L   Glucose, Bld 99 65 - 99 mg/dL   BUN 13 6 - 20 mg/dL   Creatinine, Ser 0.96 0.61 - 1.24 mg/dL   Calcium 9.0 8.9 - 10.3 mg/dL   GFR calc non Af Amer >60 >60 mL/min   GFR calc Af Amer >60 >60 mL/min    Comment: (NOTE) The eGFR has been calculated using the CKD EPI equation. This calculation has not been validated in all clinical situations. eGFR's persistently <60 mL/min signify possible Chronic Kidney Disease.    Anion gap 9 5 - 15  Lipid panel     Status: Abnormal   Collection Time: 09/02/17  3:14 AM  Result Value Ref Range   Cholesterol 131 0 - 200 mg/dL   Triglycerides 73 <150 mg/dL   HDL 35 (L) >40 mg/dL   Total CHOL/HDL Ratio 3.7 RATIO   VLDL 15 0 - 40 mg/dL   LDL Cholesterol 81 0 - 99  mg/dL    Comment:        Total Cholesterol/HDL:CHD Risk Coronary Heart Disease Risk Table                     Men   Women  1/2 Average Risk   3.4   3.3  Average Risk       5.0   4.4  2 X Average Risk   9.6   7.1  3 X Average Risk  23.4   11.0        Use the calculated Patient Ratio above and the CHD Risk Table to determine the patient's CHD Risk.        ATP III CLASSIFICATION (LDL):  <100     mg/dL   Optimal  100-129  mg/dL   Near or Above                    Optimal  130-159  mg/dL   Borderline  160-189  mg/dL   High  >190     mg/dL   Very High   Hemoglobin A1c     Status: Abnormal   Collection Time: 09/02/17  3:14 AM  Result Value Ref Range   Hgb A1c MFr Bld 5.9 (H) 4.8 - 5.6 %    Comment: (NOTE) Pre diabetes:          5.7%-6.4% Diabetes:              >6.4% Glycemic control for   <7.0% adults with diabetes     Mean Plasma Glucose 122.63 mg/dL  TSH     Status: None   Collection Time: 09/02/17  3:14 AM  Result Value Ref Range   TSH 1.753 0.350 - 4.500 uIU/mL    Comment: Performed by a 3rd Generation assay with a functional sensitivity of <=0.01 uIU/mL.  Vitamin B12     Status: None   Collection Time: 09/02/17  3:14 AM  Result Value Ref Range   Vitamin B-12 198 180 - 914 pg/mL    Comment: (NOTE) This assay is not validated for testing neonatal or myeloproliferative syndrome specimens for Vitamin B12 levels.   CBC     Status: Abnormal   Collection Time: 09/03/17  3:23 AM  Result Value Ref Range   WBC 12.6 (H) 4.0 - 10.5 K/uL   RBC 4.94 4.22 - 5.81 MIL/uL   Hemoglobin 15.5 13.0 - 17.0 g/dL   HCT 45.5 39.0 - 52.0 %   MCV 92.1 78.0 - 100.0 fL   MCH 31.4 26.0 - 34.0 pg   MCHC 34.1 30.0 - 36.0 g/dL   RDW 14.2 11.5 - 15.5 %   Platelets 210 150 - 400 K/uL  Basic metabolic panel     Status: Abnormal   Collection Time: 09/03/17  3:23 AM  Result Value Ref Range   Sodium 138 135 - 145 mmol/L   Potassium 3.8 3.5 - 5.1 mmol/L   Chloride 107 101 - 111 mmol/L   CO2 22 22 - 32 mmol/L   Glucose, Bld 112 (H) 65 - 99 mg/dL   BUN 16 6 - 20 mg/dL   Creatinine, Ser 0.91 0.61 - 1.24 mg/dL   Calcium 8.8 (L) 8.9 - 10.3 mg/dL   GFR calc non Af Amer >60 >60 mL/min   GFR calc Af Amer >60 >60 mL/min    Comment: (NOTE) The eGFR has been calculated using the CKD EPI equation. This calculation has not been validated in all clinical situations.  eGFR's persistently <60 mL/min signify possible Chronic Kidney Disease.    Anion gap 9 5 - 15      Imaging Results (Last 48 hours)  Ct Angio Head W Or Wo Contrast  Result Date: 09/01/2017 CLINICAL DATA:  Right-sided weakness.  Left parietal hemorrhage. EXAM: CT ANGIOGRAPHY HEAD AND NECK TECHNIQUE: Multidetector CT imaging of the head and neck was performed using the standard protocol during bolus administration of intravenous  contrast. Multiplanar CT image reconstructions and MIPs were obtained to evaluate the vascular anatomy. Carotid stenosis measurements (when applicable) are obtained utilizing NASCET criteria, using the distal internal carotid diameter as the denominator. CONTRAST:  50 mL Isovue 370 COMPARISON:  Brain MRI and CT 09/01/2017. No prior angiographic imaging. FINDINGS: CTA NECK FINDINGS Aortic arch: Standard 3 vessel aortic arch with mild atherosclerotic plaque. Predominantly calcified plaque at the left subclavian artery origin without stenosis. Right carotid system: Patent without evidence of stenosis or dissection. Mild calcified plaque about the carotid bifurcation. Left carotid system: Patent without evidence of stenosis or dissection. Mild calcified plaque about the carotid bifurcation and at the common carotid origin. Vertebral arteries: Patent with the left being mildly dominant. Mild luminal irregularity bilaterally without definite stenosis. Streak artifact limits assessment of the right vertebral artery at the dura. Skeleton: Advanced cervical disc degeneration. Interbody osseous fusion at C5-6. Severe right facet arthrosis at C4-5. Other neck: No mass or lymph node enlargement. Upper chest: Moderate centrilobular emphysema. Review of the MIP images confirms the above findings CTA HEAD FINDINGS Anterior circulation: The internal carotid arteries are patent from skullbase to carotid termini. There is mild siphon atherosclerosis bilaterally without significant stenosis. The ACAs and MCAs are patent without evidence of proximal branch occlusion or significant proximal stenosis. No aneurysm. No evidence of vascular malformation to account for the left parietal hemorrhage. Posterior circulation: The intracranial vertebral arteries are widely patent to the basilar. Grossly patent AICAs and SCAs bilaterally. The basilar artery is patent with minimal narrowing at the level of the AICAs. There is a patent right  posterior communicating artery. The PCAs are patent without evidence of significant proximal stenosis. No aneurysm. Venous sinuses: Patent. Anatomic variants: None. Delayed phase: 5.3 x 3.7 cm hematoma in the left parietal lobe, mildly enlarged in the interim. Mild surrounding edema with local mass effect but no midline shift. Scattered small volume subarachnoid hemorrhage bilaterally. No definite abnormal enhancement. Review of the MIP images confirms the above findings IMPRESSION: 1. Mild intracranial and cervical atherosclerosis without large vessel occlusion or significant proximal stenosis. 2. No evidence of cerebral aneurysm or vascular malformation. 3. Mild interval enlargement of the left parietal hematoma. No midline shift. 4. Scattered small volume subarachnoid hemorrhage. Electronically Signed   By: Logan Bores M.D.   On: 09/01/2017 14:10   Ct Head Wo Contrast  Result Date: 09/02/2017 CLINICAL DATA:  Follow-up examination for intracranial hemorrhage. EXAM: CT HEAD WITHOUT CONTRAST TECHNIQUE: Contiguous axial images were obtained from the base of the skull through the vertex without intravenous contrast. COMPARISON:  [facility] CT from 09/01/2017. FINDINGS: Brain: Right parietal intraparenchymal hematoma again seen, measuring 4.7 x 5.4 x 4.9 cm (estimated volume 62 cc). This is increased in size relative to prior exam. Localized vasogenic edema with regional mass effect also increased, with mass effect mass seen on the posterior left lateral ventricle. Adjacent small volume subarachnoid hemorrhage within the left parietal temporal region again noted. Right frontal subarachnoid hemorrhage is less conspicuous as compared to prior, now only faintly visible. No other  new acute intracranial hemorrhage. No acute large vessel territory infarct. Underlying atrophy with chronic small vessel ischemic disease again noted. No midline shift. No hydrocephalus. Basilar cisterns remain patent. No extra-axial fluid  collection. Vascular: No hyperdense vessel. Scattered vascular calcifications noted within the carotid siphons. Skull: Scalp soft tissues and calvarium within normal limits. Sinuses/Orbits: Globes and orbital soft tissues are unremarkable. Retention cyst partially visualize within the left maxillary sinus. Mild right maxillary and left sphenoid mucosal thickening. Paranasal sinuses otherwise clear. Trace left mastoid effusion again noted. Other: None. IMPRESSION: 1. Interval increase in size of left parietal lobe intraparenchymal hematoma, now measuring 4.7 x 5.4 x 4.9 cm (estimated volume 62 mL, previously 4.2 x 2.5 x 3.1 cm - 16 mL). Increased localized vasogenic edema and regional mass effect without midline shift. 2. Persistent small volume acute subarachnoid hemorrhage within the left parietotemporal region and right frontal lobe. 3. No other new acute intracranial abnormality. Electronically Signed   By: Jeannine Boga M.D.   On: 09/02/2017 07:19   Ct Angio Neck W Or Wo Contrast  Result Date: 09/01/2017 CLINICAL DATA:  Right-sided weakness.  Left parietal hemorrhage. EXAM: CT ANGIOGRAPHY HEAD AND NECK TECHNIQUE: Multidetector CT imaging of the head and neck was performed using the standard protocol during bolus administration of intravenous contrast. Multiplanar CT image reconstructions and MIPs were obtained to evaluate the vascular anatomy. Carotid stenosis measurements (when applicable) are obtained utilizing NASCET criteria, using the distal internal carotid diameter as the denominator. CONTRAST:  50 mL Isovue 370 COMPARISON:  Brain MRI and CT 09/01/2017. No prior angiographic imaging. FINDINGS: CTA NECK FINDINGS Aortic arch: Standard 3 vessel aortic arch with mild atherosclerotic plaque. Predominantly calcified plaque at the left subclavian artery origin without stenosis. Right carotid system: Patent without evidence of stenosis or dissection. Mild calcified plaque about the carotid  bifurcation. Left carotid system: Patent without evidence of stenosis or dissection. Mild calcified plaque about the carotid bifurcation and at the common carotid origin. Vertebral arteries: Patent with the left being mildly dominant. Mild luminal irregularity bilaterally without definite stenosis. Streak artifact limits assessment of the right vertebral artery at the dura. Skeleton: Advanced cervical disc degeneration. Interbody osseous fusion at C5-6. Severe right facet arthrosis at C4-5. Other neck: No mass or lymph node enlargement. Upper chest: Moderate centrilobular emphysema. Review of the MIP images confirms the above findings CTA HEAD FINDINGS Anterior circulation: The internal carotid arteries are patent from skullbase to carotid termini. There is mild siphon atherosclerosis bilaterally without significant stenosis. The ACAs and MCAs are patent without evidence of proximal branch occlusion or significant proximal stenosis. No aneurysm. No evidence of vascular malformation to account for the left parietal hemorrhage. Posterior circulation: The intracranial vertebral arteries are widely patent to the basilar. Grossly patent AICAs and SCAs bilaterally. The basilar artery is patent with minimal narrowing at the level of the AICAs. There is a patent right posterior communicating artery. The PCAs are patent without evidence of significant proximal stenosis. No aneurysm. Venous sinuses: Patent. Anatomic variants: None. Delayed phase: 5.3 x 3.7 cm hematoma in the left parietal lobe, mildly enlarged in the interim. Mild surrounding edema with local mass effect but no midline shift. Scattered small volume subarachnoid hemorrhage bilaterally. No definite abnormal enhancement. Review of the MIP images confirms the above findings IMPRESSION: 1. Mild intracranial and cervical atherosclerosis without large vessel occlusion or significant proximal stenosis. 2. No evidence of cerebral aneurysm or vascular malformation. 3.  Mild interval enlargement of the left parietal hematoma. No  midline shift. 4. Scattered small volume subarachnoid hemorrhage. Electronically Signed   By: Logan Bores M.D.   On: 09/01/2017 14:10   Mr Jeri Cos ZH Contrast  Result Date: 09/01/2017 CLINICAL DATA:  Initial evaluation for acute intracranial hemorrhage. EXAM: MRI HEAD WITHOUT AND WITH CONTRAST TECHNIQUE: Multiplanar, multiecho pulse sequences of the brain and surrounding structures were obtained without and with intravenous contrast. CONTRAST:  36m MULTIHANCE GADOBENATE DIMEGLUMINE 529 MG/ML IV SOLN COMPARISON:  [facility] CT from earlier the same day. FINDINGS: Brain: Diffuse prominence of the CSF containing spaces compatible with generalized cerebral atrophy. Patchy and confluent T2/FLAIR hyperintensity within the periventricular and deep white matter both cerebral hemispheres most consistent with chronic microvascular ischemic disease, overall moderate nature. Previously identified acute intraparenchymal hematoma positioned at the left parietal lobe again seen. This measures 2.7 x 4.8 x 3.9 cm, similar in appearance to previous. Localized vasogenic edema without significant regional mass effect. No appreciable underlying mass lesion identified. Patchy curvilinear enhancement along the posterior margin of the hematoma on post gadolinium sequence favored to be reactive and/or related to vascular compression. Additional multifocal areas of acute subarachnoid hemorrhage seen within mean posterior left frontal region as well as the left temporal lobe. Small volume subarachnoid hemorrhage also seen within the anterior right frontal lobe. Scattered subarachnoid hemorrhage also present within the right parietal lobe as well. No other evidence for acute or chronic intracranial hemorrhage. No findings to suggest amyloid angiography. No intraventricular hemorrhage. No other evidence for acute infarct. No other mass lesion. No midline shift. Ventricles normal  size without evidence for hydrocephalus. No extra-axial fluid collection. No other abnormal enhancement. Pituitary gland within normal limits. Midline structures intact and normal. Vascular: Major intracranial vascular flow voids are maintained. Skull and upper cervical spine: Craniocervical junction within normal limits. Visualized upper cervical spine unremarkable. Bone marrow signal intensity within normal limits. No scalp soft tissue abnormality. Sinuses/Orbits: Globes and orbital soft tissues within normal limits. Retention cysts noted within the left maxillary sinus. Paranasal sinuses otherwise clear. Small left mastoid effusion noted. Inner ear structures normal. 14 mm T2 hypointense lesion noted within the left parotid gland (series 11, image 14). Other: None. IMPRESSION: 1. Similar appearance of left parietal intraparenchymal hematoma measuring 2.7 x 4.8 x 3.9 cm. Localized vasogenic edema without significant regional mass effect. No underlying mass lesion or other abnormality. 2. Small volume acute subarachnoid hemorrhage involving the bilateral cerebral hemispheres as above. 3. Moderate cerebral atrophy with chronic small vessel ischemic disease. 4. 14 mm lesion positioned within the left parotid gland, indeterminate. Follow-up examination with dedicated neck CT with contrast suggested for further evaluation on a nonemergent basis. Electronically Signed   By: BJeannine BogaM.D.   On: 09/01/2017 06:40        Medical Problem List and Plan: 1.  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  DVT Prophylaxis/Anticoagulation: SCDs. Monitor for any signs of DVT 3. Pain Management: Baclofen 5 mg 3 times a day 4. Mood:  Provide emotional support 5. Neuropsych: This patient is capable of making decisions on his own behalf. 6. Skin/Wound Care:  Routine skin checks 7. Fluids/Electrolytes/Nutrition:  Routine I&O's  with follow-up chemistries 8.CAD with CABG.No chest pain or shortness of breath 9. Hypertension. Lopressor 25 mg daily 10.COPD/tobacco abuse. Counseling.Continue inhalers   Post Admission Physician Evaluation: 1. Functional deficits secondary  to Left parietal intraparenchymal bleed. 2. Patient is admitted to receive collaborative, interdisciplinary care between the physiatrist,  rehab nursing staff, and therapy team. 3. Patient's level of medical complexity and substantial therapy needs in context of that medical necessity cannot be provided at a lesser intensity of care such as a SNF. 4. Patient has experienced substantial functional loss from his/her baseline which was documented above under the "Functional History" and "Functional Status" headings.  Judging by the patient's diagnosis, physical exam, and functional history, the patient has potential for functional progress which will result in measurable gains while on inpatient rehab.  These gains will be of substantial and practical use upon discharge  in facilitating mobility and self-care at the household level. 5. Physiatrist will provide 24 hour management of medical needs as well as oversight of the therapy plan/treatment and provide guidance as appropriate regarding the interaction of the two. 6. The Preadmission Screening has been reviewed and patient status is unchanged unless otherwise stated above. 7. 24 hour rehab nursing will assist with bladder management, bowel management, safety, skin/wound care, disease management, medication administration, pain management and patient education  and help integrate therapy concepts, techniques,education, etc. 8. PT will assess and treat for/with: pre gait, gait training, endurance , safety, equipment, neuromuscular re education.   Goals are: Min A. 9. OT will assess and treat for/with: ADLs, Cognitive perceptual skills, Neuromuscular re education, safety, endurance, equipment.   Goals are: Min A.  Therapy may proceed with showering this patient. 10. SLP will assess and treat for/with: Cognition, communication , swallowing.  Goals are: Min A for expression of basic needs. 11. Case Management and Social Worker will assess and treat for psychological issues and discharge planning. 12. Team conference will be held weekly to assess progress toward goals and to determine barriers to discharge. 13. Patient will receive at least 3 hours of therapy per day at least 5 days per week. 14. ELOS: 18-21d       15. Prognosis:  good     Charlett Blake M.D. [facility] FAAPM&R (Sports Med, Neuromuscular Med) Diplomate Am Board of Electrodiagnostic Med  Cathlyn Parsons., PA-C 09/03/2017

## 2017-09-09 NOTE — Progress Notes (Signed)
STROKE TEAM PROGRESS NOTE   SUBJECTIVE (INTERVAL HISTORY) His wife is at the bedside.  [name] Kitchen His admission to CIR was declined by [place] today even after my peer to peer review discussion with medical Dir. Rehabilitation team plans to file expedited appeal again and family also plans to repeat his.  OBJECTIVE  CBC:   Recent Labs Lab 09/06/17 0506 09/07/17 0628  WBC 11.6* 12.4*  HGB 15.4 14.5  HCT 43.1 42.9  MCV 91.1 91.3  PLT 227 241    Basic Metabolic Panel:   Recent Labs Lab 09/04/17 0346  09/06/17 0506 09/07/17 0628  NA 134*  < > 133* 131*  K 3.7  < > 3.5 3.8  CL 103  < > 97* 95*  CO2 21*  < > 25 26  GLUCOSE 123*  < > 104* 112*  BUN 17  < > 17 15  CREATININE 0.85  < > 0.82 0.84  CALCIUM 8.8*  < > 8.6* 8.7*  MG 1.9  --   --   --   < > = values in this interval not displayed.  Lipid Panel:     Component Value Date/Time   CHOL 131 09/02/2017 0314   TRIG 73 09/02/2017 0314   TRIG 65 09/21/2006 0832   HDL 35 (L) 09/02/2017 0314   CHOLHDL 3.7 09/02/2017 0314   VLDL 15 09/02/2017 0314   LDLCALC 81 09/02/2017 0314   HgbA1c:  Lab Results  Component Value Date   HGBA1C 5.9 (H) 09/02/2017   Urine Drug Screen:     Component Value Date/Time   LABOPIA NONE DETECTED 09/04/2017 0900   COCAINSCRNUR NONE DETECTED 09/04/2017 0900   LABBENZ NONE DETECTED 09/04/2017 0900   AMPHETMU NONE DETECTED 09/04/2017 0900   THCU NONE DETECTED 09/04/2017 0900   LABBARB NONE DETECTED 09/04/2017 0900    Alcohol Level     Component Value Date/Time   ETH <10 09/01/2017 0250    IMAGING I have personally reviewed the radiological images below and agree with the radiology interpretations.  Ct Head 09/02/2017 1. Interval increase in size of left parietal lobe intraparenchymal hematoma, now measuring 4.7 x 5.4 x 4.9 cm (estimated volume 62 mL, previously 4.2 x 2.5 x 3.1 cm - 16 mL). Increased localized vasogenic edema and regional mass effect without midline shift. 2. Persistent small  volume acute subarachnoid hemorrhage within the left parietotemporal region and right frontal lobe. 3. No other new acute intracranial abnormality.  09/01/2017 4.2 x 2.5 x 3.1 cm left parietal lobe intraparenchymal hematoma (estimated volume 16 cc). Mild localized vasogenic edema without significant regional mass effect. 2. Small volume acute subarachnoid hemorrhage within the adjacent left parietal lobe as well as at the anterior right frontal lobe. 3. Moderate cerebral atrophy with chronic small vessel ischemic disease.  Ct Angio Head W Or Wo Contrast Ct Angio Neck W Or Wo Contrast 09/01/2017 1. Mild intracranial and cervical atherosclerosis without large vessel occlusion or significant proximal stenosis. 2. No evidence of cerebral aneurysm or vascular malformation. 3. Mild interval enlargement of the left parietal hematoma. No midline shift. 4. Scattered small volume subarachnoid hemorrhage.  Mr Jaime Pierce Wo Contrast 09/01/2017 1. Similar appearance of left parietal intraparenchymal hematoma measuring 2.7 x 4.8 x 3.9 cm. Localized vasogenic edema without significant regional mass effect. No underlying mass lesion or other abnormality. 2. Small volume acute subarachnoid hemorrhage involving the bilateral cerebral hemispheres as above. 3. Moderate cerebral atrophy with chronic small vessel ischemic disease. 4. 14 mm lesion positioned within  the left parotid gland, indeterminate. Follow-up examination with dedicated neck CT with contrast suggested for further evaluation on a nonemergent basis.  TTE  - Left ventricle: The cavity size was normal. Systolic function was   normal. The estimated ejection fraction was in the range of 50%   to 55%. Wall motion was normal; there were no regional wall   motion abnormalities. Left ventricular diastolic function   parameters were normal. - Ventricular septum: Septal motion showed paradox. These changes   are consistent with a post-thoracotomy  state. - Aortic valve: There was trivial regurgitation. - Atrial septum: No defect or patent foramen ovale was identified.  Ct Head Wo Contrast 09/04/2017 IMPRESSION: 1. Left parietal parenchymal hematoma has a stable morphology but does measure mildly larger than prior (54 x 52 x 57 mm compared to 49 x 52 x 55 mm). Mild increase in surrounding vasogenic edema. Progressive local mass effect and ventricular deformity without herniation or entrapment. 2. Small volume subarachnoid hemorrhage in the left parietal and right frontal lobes is stable.   PHYSICAL EXAM Temp:  [97.7 F (36.5 C)-99.1 F (37.3 C)] 98 F (36.7 C) (10/18 1338) Pulse Rate:  [66-89] 83 (10/18 1338) Resp:  [18] 18 (10/18 1007) BP: (121-135)/(63-80) 121/69 (10/18 1338) SpO2:  [90 %-95 %] 90 % (10/18 1338)  General - Well nourished, well developed, in no apparent distress.  Skin - Erythematous rash present over both buttocks and proximal posterior thigh, in area of diminished light touch sensation  Mental Status -  Level of arousal and orientation to time, place, and person were intact. Language was intact conversationally Attention span and concentration were normal.   Cranial Nerves II - XII - II - Visual field intact OU. III, IV, VI - Extraocular movements intact except mild right gaze restriction V - Facial sensation intact bilaterally. VII - mild R facial droop VIII - Hearing intact bilaterally X - Palate elevates symmetrically. XI - Chin turning & shoulder shrug intact bilaterally. XII - Tongue protrusion intact and midline.  Motor Strength - Strength is 0/5 in RUE and 3/5 in RLE to painful stimuli. Left sided strength was intact. Increased tone on the RUE and RLE  Reflexes - No pathological reflexes.  Sensory - Touch sensation remains present but decreased in RUE and RLE.  Coordination - intact left UE and LE.   Gait and Station - Not tested   ASSESSMENT/PLAN [name] is a 71 y.o.  male with history of CAD s/p CABG, hypertension, hyperlipidemia, COPD, tobacco use presenting with acute ICH. He did not receive IV t-PA due to hemorrhage.   ICH:  Left parietal lobe large hematoma with small R frontal SAH, most likely due to hypertension and small vessel disease  Resultant  R arm flaccid and right leg mild weakness with parasthesia  CT head - Left parietal lobe hematoma with small R frontal SAH  MRI head - left parietal hematoma enlargement with small right frontal SAH  CTA head and neck - no aneurysm or AVM. Interval enlargement of the left parietal hematoma. No midline shift.  CT repeat 09/02/17 and 09/04/17 - stable left large parietal ICH  2D Echo  EF 50-55%  LDL 81  HgbA1c 5.9  SCDs for VTE prophylaxis Diet Heart Room service appropriate? Yes; Fluid consistency: Thin  aspirin 81 mg daily prior to admission, now on No antithrombotic  Patient counseled to be compliant with his antithrombotic medications  Ongoing aggressive stroke risk factor management  Therapy recommendations:  CIR  Disposition:  Pending (CIR if approved)  Right-sided spasticity  increased muscle tone right upper extremity and right lower extremity  Baclofen 5 mg 3 times a day   Improved  Hypertension  Home meds - irbesartan, metoprolol  Currently he is stable and controlled on metoprolol alone  Long-term BP goal normotensive < 130/90  Hyperlipidemia  Home meds:  atorvastatin 40mg   LDL 81, goal < 70  Consider to resume lipitor on discharge  Tobacco abuse  Current smoker  Smoking cessation counseling provided  Pt is willing to quit  Other Stroke Risk Factors  Advanced age  CAD s/p CABG  Other active condition  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 After hours, contact General Neurology

## 2017-09-09 NOTE — Progress Notes (Signed)
CSW alerted by Rehab Admission Coordinator and RNCM that patient and family would like to [facility], if [facility] appeal is unsuccessful. CSW will sign off.  Blenda NicelyElizabeth Sherri Mcarthy, [place]CS[place] Clinical Social Worker [phone]

## 2017-09-09 NOTE — Progress Notes (Signed)
Occupational Therapy Treatment Patient Details Name: Jaime Pierce MRN: [id number] DOB: Aug 11, 1946 Today's Date: 09/09/2017    History of present illness 71 y.o. male admitted on 09/01/17 for R sided weakness with CT revealing a L parietal lobe intraparenchymal hematoma, small volume SAH in adjacent L parietal lobe as well as R frontal lobe.  Pt with significant PMH of HTN, DOE, CAD, and CABG.     OT comments  Pt continues to show progress daily. Excellent response with using "3 musketeer approach" with sit - stand with facilitation through BLE and trunk  to achieve upright midline postural control . Pt able to maintain upright control with min A at times. After facilitating lateral weight shifts, pt able to begin to relieve pressure from L foot in preparation for stepping.  Able to facilitate movement with RUE (proximally) after inhibition of tone and repetitive movement patterns. Continue to recommend CIR to intense rehab to facilitate safe DC home with 24/7 assistance of family. Wife present during entire session and educated on techniques to use to increase pt's independence with ADL at bed level.   Follow Up Recommendations  CIR;Supervision/Assistance - 24 hour    Equipment Recommendations  Other (comment)    Recommendations for Other Services Rehab consult    Precautions / Restrictions Precautions Precautions: Fall Restrictions Weight Bearing Restrictions: No       Mobility Bed Mobility Overal bed mobility: Needs Assistance Bed Mobility: Rolling;Sidelying to Sit;Sit to Sidelying Rolling: Min assist Sidelying to sit: Mod assist Supine to sit: Mod assist   Sit to sidelying: Mod assist General bed mobility comments: Using weight bearing through RUE to help push up into sitting. Using L LE behind RLE to guide off bed  Transfers Overall transfer level: Needs assistance   Transfers: Sit to/from Stand Sit to Stand: Max assist;+2 physical assistance         General  transfer comment: facilitation technqieus to achieve upright standing. Once in standing. used tactile cues to shift weight laterally to maintain stnading balance with min A    Balance Overall balance assessment: Needs assistance Sitting-balance support: Feet supported;Single extremity supported Sitting balance-Leahy Scale: Fair Sitting balance - Comments: able to maintain midlein postural control with S   Standing balance support: During functional activity;Bilateral upper extremity supported Standing balance-Leahy Scale: Poor Standing balance comment: BUE support                           ADL either performed or assessed with clinical judgement   ADL            Grooming - wife has been working with pt on brushing teeth. States pt at times confuses steps. Recommend wife simplify activity and minimize choices pt has during activity due to motor planning deficits. Wife verbalized understanding.            Lower Body Dressing: Moderate assistance;Bed level Lower Body Dressing Details (indicate cue type and reason): Utilized backward chaining to pull underwear up and rolled side to side to donn over hips             Functional mobility during ADLs: +2 for physical assistance;Maximal assistance General ADL Comments: Max A +2 using 3 musketeer approach to facilitate sit - stand.      Vision   Additional Comments: vision impaired. Decreased visual attention. will further assess   Perception     Praxis      Cognition Arousal/Alertness: Awake/alert Behavior During Therapy: Flat  affect Overall Cognitive Status: Impaired/Different from baseline Area of Impairment: Attention;Following commands;Safety/judgement;Awareness;Problem solving                   Current Attention Level: Selective   Following Commands: Follows one step commands with increased time (motor planning) Safety/Judgement: Decreased awareness of safety;Decreased awareness of  deficits Awareness: Emergent Problem Solving: Slow processing;Decreased initiation;Difficulty sequencing;Requires verbal cues;Requires tactile cues General Comments: improved attention to Right during functional tasks; improved motor planning        Exercises Other Exercises Other Exercises: using body on arm rotational movements in supine to separte trunk/upper body to inhibit tone. Able to position pt oin R sidelying with decreased spasticity. Able to achieve 90 FF with arm ER. Wife educated on importance of R sidelying Other Exercises: postural control activites facilitating midline and reaching to R Other Exercises: PNF movements used in sitting to facilitate trunk movement followed by head an darm movement. Other Exercises: Able to facilitate RUE movement during functional activity with rubbing lotion of leg. PT with increase in tone, but ableto diminish tone with verbal feed back to "quiet" arm.   Shoulder Instructions       General Comments      Pertinent Vitals/ Pain       Pain Assessment: Faces Faces Pain Scale: Hurts little more Pain Location: knees Pain Descriptors / Indicators: Discomfort;Grimacing Pain Intervention(s): Limited activity within patient's tolerance  Home Living                                          Prior Functioning/Environment              Frequency  Min 3X/week        Progress Toward Goals  OT Goals(current goals can now be found in the care plan section)  Progress towards OT goals: Progressing toward goals  Acute Rehab OT Goals Patient Stated Goal: to work hard and rehab hard so he can get better OT Goal Formulation: With patient/family Time For Goal Achievement: 09/16/17 Potential to Achieve Goals: Good ADL Goals Pt Will Perform Grooming: with min assist;sitting Pt Will Perform Upper Body Bathing: with min assist;sitting Pt Will Transfer to Toilet: with min assist;stand pivot transfer;bedside commode Pt Will  Perform Toileting - Clothing Manipulation and hygiene: with min assist;sit to/from stand Additional ADL Goal #1: Pt/wife will demonstrate proper positioning of RUE for protection and edema control. Additional ADL Goal #2: Complete bed mobility with min A in preparation for ADL  Plan Discharge plan remains appropriate    Co-evaluation                 AM-PAC PT "6 Clicks" Daily Activity     Outcome Measure   Help from another person eating meals?: A Little Help from another person taking care of personal grooming?: A Little Help from another person toileting, which includes using toliet, bedpan, or urinal?: A Lot Help from another person bathing (including washing, rinsing, drying)?: A Lot Help from another person to put on and taking off regular upper body clothing?: A Lot Help from another person to put on and taking off regular lower body clothing?: A Lot 6 Click Score: 14    End of Session Equipment Utilized During Treatment: Gait belt;Oxygen  OT Visit Diagnosis: Unsteadiness on feet (R26.81);Other abnormalities of gait and mobility (R26.89);Muscle weakness (generalized) (M62.81);Hemiplegia and hemiparesis Hemiplegia - Right/Left: Right Hemiplegia -  dominant/non-dominant: Dominant Hemiplegia - caused by: Other Nontraumatic intracranial hemorrhage;Nontraumatic SAH   Activity Tolerance Patient tolerated treatment well   Patient Left in bed;with call bell/phone within reach;with bed alarm set;with family/visitor present;with SCD's reapplied   Nurse Communication Mobility status        Time: 1530-1620 OT Time Calculation (min): 50 min  Charges: OT General Charges $OT Visit: 1 Visit OT Treatments $Self Care/Home Management : 8-22 mins $Neuromuscular Re-education: 23-37 mins  [facility] Daniele Dillow, OT/L  [phone] 09/09/2017   Ellison Rieth,HILLARY 09/09/2017, 4:54 PM

## 2017-09-09 NOTE — Progress Notes (Signed)
Kirsteins, Jaime Sparrow, MD Physician Signed Physical Medicine and Rehabilitation  Consult Note Date of Service: 09/02/2017 12:13 PM  Related encounter: ED to Hosp-Admission (Current) from 09/01/2017 in [facility]     Expand All Collapse All   [] Hide copied text [] Hover for attribution information      Physical Medicine and Rehabilitation Consult Reason for Consult:  Right side weakness Referring Physician:   Dr.Xu   HPI: Jaime Pierce is a 71 y.o.right handed male with history of CAD with CABG, hypertension, tobacco abuse.  Per chart review patient lives with spouse. Independent and active. Wife is also retired. One level home 2 steps to entry.  Presented 09/01/2017 with acute onset of right sided weakness and facial droop. Blood pressure 170/90.  CT/MRI showed a 4.2 x 2.5 x 3.1 cm left parietal lobe intraparenchymal hematoma. Small-volume acute subarachnoid hemorrhage was in the adjacent left parietal lobe as well as at the anterior right frontal lobe. CT angiogram head and neck with no evidence of aneurysm or vascular malformation. Follow-up neurology services with conservative care.  Echocardiogram with ejection fraction of 55% no wall motion abnormalities. Follow-up cranial CT scan 09/02/2017 showed a mild interval increase in size of left parietal lobe intraparenchymal hematoma measuring 4.7 x 5.4 x 4.9 cm. No other acute intracranial abnormality. Tolerating a regular consistency diet. Physical therapy evaluation completed with recommendations of physical medicine rehabilitation consult.  Patient retired but active prior to admission. Wife and daughter at bedside.   Review of Systems  Constitutional: Negative for chills and fever.  HENT: Negative for hearing loss.   Eyes: Negative for blurred vision and double vision.  Respiratory: Positive for shortness of breath. Negative for cough.   Cardiovascular: Negative for chest pain, palpitations and leg  swelling.  Gastrointestinal: Positive for constipation. Negative for nausea and vomiting.  Genitourinary: Negative for dysuria, flank pain and hematuria.  Musculoskeletal: Positive for joint pain and myalgias.  Skin: Negative for rash.  Neurological: Positive for weakness. Negative for seizures.  All other systems reviewed and are negative.      Past Medical History:  Diagnosis Date  . Coronary artery disease   . Dyspnea on exertion   . Hyperlipidemia   . Hypertension   . Tobacco abuse         Past Surgical History:  Procedure Laterality Date  . CORONARY ARTERY BYPASS GRAFT          Family History  Problem Relation Age of Onset  . Heart disease Father   . CVA Mother   . Heart disease Maternal Grandfather    Social History:  reports that he has been smoking Cigarettes.  He has a 25.00 pack-year smoking history. He has never used smokeless tobacco. He reports that he drinks alcohol. He reports that he does not use drugs. Allergies:      Allergies  Allergen Reactions  . Sulfonamide Derivatives          Medications Prior to Admission  Medication Sig Dispense Refill  . albuterol (PROAIR HFA) 108 (90 Base) MCG/ACT inhaler 2 puffs up to every 4 hours if can't catch your breath (Patient taking differently: Inhale 1-2 puffs into the lungs every 4 (four) hours as needed for wheezing or shortness of breath. ) 1 Inhaler 11  . atorvastatin (LIPITOR) 40 MG tablet Take 1 tablet (40 mg total) by mouth daily. 90 tablet 3  . budesonide-formoterol (SYMBICORT) 160-4.5 MCG/ACT inhaler Take 2 puffs first thing in am and then another  2 puffs about 12 hours later. (Patient taking differently: Inhale 2 puffs into the lungs 2 (two) times daily. ) 1 Inhaler 0  . cetirizine (ZYRTEC) 10 MG tablet Take 10 mg by mouth daily.    . irbesartan (AVAPRO) 150 MG tablet Take 1 tablet (150 mg total) by mouth daily. 90 tablet 3  . metoprolol (LOPRESSOR) 50 MG tablet Take 0.5 tablets (25 mg  total) by mouth 2 (two) times daily. 90 tablet 3  . nitroGLYCERIN (NITROSTAT) 0.4 MG SL tablet Place 1 tablet (0.4 mg total) under the tongue every 5 (five) minutes as needed for chest pain. 20 tablet 3    Home: Home Living Family/patient expects to be discharged to:: Private residence Living Arrangements: Spouse/significant other Available Help at Discharge: Family, Available 24 hours/day (wife is also retired) Type of Home: House Home Access: Stairs to enter Secretary/administrator of Steps: 2 Entrance Stairs-Rails: None Home Layout: One level Bathroom Shower/Tub: Health visitor: Standard Home Equipment: Information systems manager - built in Additional Comments: liked to play golf  Lives With: Spouse  Functional History: Prior Function Level of Independence: Independent Comments: still drives, retired Psychologist, clinical Status:  Mobility: Bed Mobility Overal bed mobility: Needs Assistance Bed Mobility: Supine to Sit Supine to sit: +2 for physical assistance, Mod assist, HOB elevated General bed mobility comments: Two person mod assist to help right leg mostly and trunk for balance during transitions.  HOB elevated to ~35 degrees Transfers Overall transfer level: Needs assistance Equipment used: 2 person hand held assist Transfers: Sit to/from Stand, Stand Pivot Transfers Sit to Stand: +2 physical assistance, Mod assist Stand pivot transfers: +2 physical assistance, Mod assist General transfer comment: Two person heavy mod assist to stand EOB with right knee blocked (it does buckle when block loosened).  Assist needed to weight shift left and ID that he needs to weight shift left in standing.  Heavy mod assist to pivot on his left stronger foot while blocking and helping to progress his right foot around to the chair.  support at trunk and continued right lateral lean statically and more so dynamically  Ambulation/Gait General Gait Details: not ready to attempt today.    ADL:  Cognition: Cognition Overall Cognitive Status: Impaired/Different from baseline Arousal/Alertness: Awake/alert Orientation Level: Oriented X4 Attention: Sustained Sustained Attention: Appears intact Memory: Impaired Memory Impairment: Retrieval deficit Awareness: Appears intact Problem Solving: Appears intact Executive Function: Reasoning, Initiating, Self Correcting, Self Monitoring Reasoning: Appears intact Initiating: Appears intact Self Monitoring: Appears intact Self Correcting: Appears intact Safety/Judgment: Appears intact Cognition Arousal/Alertness: Awake/alert Behavior During Therapy:  (tearful, appropriately so) Overall Cognitive Status: Impaired/Different from baseline Area of Impairment: Attention, Following commands, Safety/judgement, Awareness, Problem solving Current Attention Level: Selective Following Commands: Follows one step commands consistently, Follows multi-step commands inconsistently Safety/Judgement: Decreased awareness of deficits Awareness: Emergent Problem Solving: Difficulty sequencing, Requires verbal cues, Requires tactile cues General Comments: Some mild cognitive deficits related to awareness noted, and higher level activities (for instance he got step forward with your left foot incorrect two times in a row on two consecutie stands), not aware of his lateral lean at times.   Blood pressure 113/74, pulse 66, temperature 97.8 F (36.6 C), temperature source Oral, resp. rate 20, height 6' (1.829 m), weight 85.1 kg (187 lb 9.8 oz), SpO2 95 %. Physical Exam  Vitals reviewed. Constitutional: He is oriented to person, place, and time.  HENT:  Mild right facial droop  Eyes: EOM are normal.  Neck: Normal range of motion. Neck  supple. No thyromegaly present.  Cardiovascular: Normal rate, regular rhythm and normal heart sounds.   Respiratory: Effort normal and breath sounds normal. No respiratory distress.  GI: Soft. Bowel sounds are  normal. He exhibits no distension.  Neurological: He is alert and oriented to person, place, and time.  Patient would easily become tearful during exam. He followed commands. Fair awareness of deficits.  Skin: Skin is warm and dry.  evidence of ipsilateral motor apraxia, left upper extremity. This limits  Manual muscle testing, grossly 5/5 in the left deltoid, biceps, triceps, grip, hip flexor, knee extensor, wrist flexor Right upper extremity 0/5 in the deltoid, biceps, triceps, finger flexors and extensors, trace hip, knee extensor symmetry right lower extremity. Sensation absent to light touch in the right upper and right lower limb. He has intact pinch sensation in the right toes, but absent at the right fingers. Intact sensation on the left side Oriented to person, place, year, month, but not day and date. visual fields are intact confrontation testing. No evidence of right neglect Lab Results Last 24 Hours       Results for orders placed or performed during the hospital encounter of 09/01/17 (from the past 24 hour(s))  CBC     Status: Abnormal   Collection Time: 09/02/17  3:14 AM  Result Value Ref Range   WBC 12.1 (H) 4.0 - 10.5 K/uL   RBC 5.01 4.22 - 5.81 MIL/uL   Hemoglobin 15.7 13.0 - 17.0 g/dL   HCT 16.1 09.6 - 04.5 %   MCV 92.8 78.0 - 100.0 fL   MCH 31.3 26.0 - 34.0 pg   MCHC 33.8 30.0 - 36.0 g/dL   RDW 40.9 81.1 - 91.4 %   Platelets 210 150 - 400 K/uL  Basic metabolic panel     Status: None   Collection Time: 09/02/17  3:14 AM  Result Value Ref Range   Sodium 139 135 - 145 mmol/L   Potassium 4.1 3.5 - 5.1 mmol/L   Chloride 107 101 - 111 mmol/L   CO2 23 22 - 32 mmol/L   Glucose, Bld 99 65 - 99 mg/dL   BUN 13 6 - 20 mg/dL   Creatinine, Ser 7.82 0.61 - 1.24 mg/dL   Calcium 9.0 8.9 - 95.6 mg/dL   GFR calc non Af Amer >60 >60 mL/min   GFR calc Af Amer >60 >60 mL/min   Anion gap 9 5 - 15  Lipid panel     Status: Abnormal   Collection Time: 09/02/17   3:14 AM  Result Value Ref Range   Cholesterol 131 0 - 200 mg/dL   Triglycerides 73 <213 mg/dL   HDL 35 (L) >08 mg/dL   Total CHOL/HDL Ratio 3.7 RATIO   VLDL 15 0 - 40 mg/dL   LDL Cholesterol 81 0 - 99 mg/dL  Hemoglobin M5H     Status: Abnormal   Collection Time: 09/02/17  3:14 AM  Result Value Ref Range   Hgb A1c MFr Bld 5.9 (H) 4.8 - 5.6 %   Mean Plasma Glucose 122.63 mg/dL  TSH     Status: None   Collection Time: 09/02/17  3:14 AM  Result Value Ref Range   TSH 1.753 0.350 - 4.500 uIU/mL  Vitamin B12     Status: None   Collection Time: 09/02/17  3:14 AM  Result Value Ref Range   Vitamin B-12 198 180 - 914 pg/mL      Imaging Results (Last 48 hours)  Ct Angio Head  W Or Wo Contrast  Result Date: 09/01/2017 CLINICAL DATA:  Right-sided weakness.  Left parietal hemorrhage. EXAM: CT ANGIOGRAPHY HEAD AND NECK TECHNIQUE: Multidetector CT imaging of the head and neck was performed using the standard protocol during bolus administration of intravenous contrast. Multiplanar CT image reconstructions and MIPs were obtained to evaluate the vascular anatomy. Carotid stenosis measurements (when applicable) are obtained utilizing NASCET criteria, using the distal internal carotid diameter as the denominator. CONTRAST:  50 mL Isovue 370 COMPARISON:  Brain MRI and CT 09/01/2017. No prior angiographic imaging. FINDINGS: CTA NECK FINDINGS Aortic arch: Standard 3 vessel aortic arch with mild atherosclerotic plaque. Predominantly calcified plaque at the left subclavian artery origin without stenosis. Right carotid system: Patent without evidence of stenosis or dissection. Mild calcified plaque about the carotid bifurcation. Left carotid system: Patent without evidence of stenosis or dissection. Mild calcified plaque about the carotid bifurcation and at the common carotid origin. Vertebral arteries: Patent with the left being mildly dominant. Mild luminal irregularity bilaterally without definite  stenosis. Streak artifact limits assessment of the right vertebral artery at the dura. Skeleton: Advanced cervical disc degeneration. Interbody osseous fusion at C5-6. Severe right facet arthrosis at C4-5. Other neck: No mass or lymph node enlargement. Upper chest: Moderate centrilobular emphysema. Review of the MIP images confirms the above findings CTA HEAD FINDINGS Anterior circulation: The internal carotid arteries are patent from skullbase to carotid termini. There is mild siphon atherosclerosis bilaterally without significant stenosis. The ACAs and MCAs are patent without evidence of proximal branch occlusion or significant proximal stenosis. No aneurysm. No evidence of vascular malformation to account for the left parietal hemorrhage. Posterior circulation: The intracranial vertebral arteries are widely patent to the basilar. Grossly patent AICAs and SCAs bilaterally. The basilar artery is patent with minimal narrowing at the level of the AICAs. There is a patent right posterior communicating artery. The PCAs are patent without evidence of significant proximal stenosis. No aneurysm. Venous sinuses: Patent. Anatomic variants: None. Delayed phase: 5.3 x 3.7 cm hematoma in the left parietal lobe, mildly enlarged in the interim. Mild surrounding edema with local mass effect but no midline shift. Scattered small volume subarachnoid hemorrhage bilaterally. No definite abnormal enhancement. Review of the MIP images confirms the above findings IMPRESSION: 1. Mild intracranial and cervical atherosclerosis without large vessel occlusion or significant proximal stenosis. 2. No evidence of cerebral aneurysm or vascular malformation. 3. Mild interval enlargement of the left parietal hematoma. No midline shift. 4. Scattered small volume subarachnoid hemorrhage. Electronically Signed   By: Sebastian AcheAllen  Grady M.D.   On: 09/01/2017 14:10   Ct Head Wo Contrast  Result Date: 09/02/2017 CLINICAL DATA:  Follow-up examination for  intracranial hemorrhage. EXAM: CT HEAD WITHOUT CONTRAST TECHNIQUE: Contiguous axial images were obtained from the base of the skull through the vertex without intravenous contrast. COMPARISON:  [facility] CT from 09/01/2017. FINDINGS: Brain: Right parietal intraparenchymal hematoma again seen, measuring 4.7 x 5.4 x 4.9 cm (estimated volume 62 cc). This is increased in size relative to prior exam. Localized vasogenic edema with regional mass effect also increased, with mass effect mass seen on the posterior left lateral ventricle. Adjacent small volume subarachnoid hemorrhage within the left parietal temporal region again noted. Right frontal subarachnoid hemorrhage is less conspicuous as compared to prior, now only faintly visible. No other new acute intracranial hemorrhage. No acute large vessel territory infarct. Underlying atrophy with chronic small vessel ischemic disease again noted. No midline shift. No hydrocephalus. Basilar cisterns remain patent. No extra-axial fluid  collection. Vascular: No hyperdense vessel. Scattered vascular calcifications noted within the carotid siphons. Skull: Scalp soft tissues and calvarium within normal limits. Sinuses/Orbits: Globes and orbital soft tissues are unremarkable. Retention cyst partially visualize within the left maxillary sinus. Mild right maxillary and left sphenoid mucosal thickening. Paranasal sinuses otherwise clear. Trace left mastoid effusion again noted. Other: None. IMPRESSION: 1. Interval increase in size of left parietal lobe intraparenchymal hematoma, now measuring 4.7 x 5.4 x 4.9 cm (estimated volume 62 mL, previously 4.2 x 2.5 x 3.1 cm - 16 mL). Increased localized vasogenic edema and regional mass effect without midline shift. 2. Persistent small volume acute subarachnoid hemorrhage within the left parietotemporal region and right frontal lobe. 3. No other new acute intracranial abnormality. Electronically Signed   By: Rise Mu M.D.   On:  09/02/2017 07:19   Ct Angio Neck W Or Wo Contrast  Result Date: 09/01/2017 CLINICAL DATA:  Right-sided weakness.  Left parietal hemorrhage. EXAM: CT ANGIOGRAPHY HEAD AND NECK TECHNIQUE: Multidetector CT imaging of the head and neck was performed using the standard protocol during bolus administration of intravenous contrast. Multiplanar CT image reconstructions and MIPs were obtained to evaluate the vascular anatomy. Carotid stenosis measurements (when applicable) are obtained utilizing NASCET criteria, using the distal internal carotid diameter as the denominator. CONTRAST:  50 mL Isovue 370 COMPARISON:  Brain MRI and CT 09/01/2017. No prior angiographic imaging. FINDINGS: CTA NECK FINDINGS Aortic arch: Standard 3 vessel aortic arch with mild atherosclerotic plaque. Predominantly calcified plaque at the left subclavian artery origin without stenosis. Right carotid system: Patent without evidence of stenosis or dissection. Mild calcified plaque about the carotid bifurcation. Left carotid system: Patent without evidence of stenosis or dissection. Mild calcified plaque about the carotid bifurcation and at the common carotid origin. Vertebral arteries: Patent with the left being mildly dominant. Mild luminal irregularity bilaterally without definite stenosis. Streak artifact limits assessment of the right vertebral artery at the dura. Skeleton: Advanced cervical disc degeneration. Interbody osseous fusion at C5-6. Severe right facet arthrosis at C4-5. Other neck: No mass or lymph node enlargement. Upper chest: Moderate centrilobular emphysema. Review of the MIP images confirms the above findings CTA HEAD FINDINGS Anterior circulation: The internal carotid arteries are patent from skullbase to carotid termini. There is mild siphon atherosclerosis bilaterally without significant stenosis. The ACAs and MCAs are patent without evidence of proximal branch occlusion or significant proximal stenosis. No aneurysm. No  evidence of vascular malformation to account for the left parietal hemorrhage. Posterior circulation: The intracranial vertebral arteries are widely patent to the basilar. Grossly patent AICAs and SCAs bilaterally. The basilar artery is patent with minimal narrowing at the level of the AICAs. There is a patent right posterior communicating artery. The PCAs are patent without evidence of significant proximal stenosis. No aneurysm. Venous sinuses: Patent. Anatomic variants: None. Delayed phase: 5.3 x 3.7 cm hematoma in the left parietal lobe, mildly enlarged in the interim. Mild surrounding edema with local mass effect but no midline shift. Scattered small volume subarachnoid hemorrhage bilaterally. No definite abnormal enhancement. Review of the MIP images confirms the above findings IMPRESSION: 1. Mild intracranial and cervical atherosclerosis without large vessel occlusion or significant proximal stenosis. 2. No evidence of cerebral aneurysm or vascular malformation. 3. Mild interval enlargement of the left parietal hematoma. No midline shift. 4. Scattered small volume subarachnoid hemorrhage. Electronically Signed   By: Sebastian Ache M.D.   On: 09/01/2017 14:10   [name] ZO Contrast  Result Date: 09/01/2017  CLINICAL DATA:  Initial evaluation for acute intracranial hemorrhage. EXAM: MRI HEAD WITHOUT AND WITH CONTRAST TECHNIQUE: Multiplanar, multiecho pulse sequences of the brain and surrounding structures were obtained without and with intravenous contrast. CONTRAST:  15mL MULTIHANCE GADOBENATE DIMEGLUMINE 529 MG/ML IV SOLN COMPARISON:  [facility] CT from earlier the same day. FINDINGS: Brain: Diffuse prominence of the CSF containing spaces compatible with generalized cerebral atrophy. Patchy and confluent T2/FLAIR hyperintensity within the periventricular and deep white matter both cerebral hemispheres most consistent with chronic microvascular ischemic disease, overall moderate nature. Previously identified  acute intraparenchymal hematoma positioned at the left parietal lobe again seen. This measures 2.7 x 4.8 x 3.9 cm, similar in appearance to previous. Localized vasogenic edema without significant regional mass effect. No appreciable underlying mass lesion identified. Patchy curvilinear enhancement along the posterior margin of the hematoma on post gadolinium sequence favored to be reactive and/or related to vascular compression. Additional multifocal areas of acute subarachnoid hemorrhage seen within mean posterior left frontal region as well as the left temporal lobe. Small volume subarachnoid hemorrhage also seen within the anterior right frontal lobe. Scattered subarachnoid hemorrhage also present within the right parietal lobe as well. No other evidence for acute or chronic intracranial hemorrhage. No findings to suggest amyloid angiography. No intraventricular hemorrhage. No other evidence for acute infarct. No other mass lesion. No midline shift. Ventricles normal size without evidence for hydrocephalus. No extra-axial fluid collection. No other abnormal enhancement. Pituitary gland within normal limits. Midline structures intact and normal. Vascular: Major intracranial vascular flow voids are maintained. Skull and upper cervical spine: Craniocervical junction within normal limits. Visualized upper cervical spine unremarkable. Bone marrow signal intensity within normal limits. No scalp soft tissue abnormality. Sinuses/Orbits: Globes and orbital soft tissues within normal limits. Retention cysts noted within the left maxillary sinus. Paranasal sinuses otherwise clear. Small left mastoid effusion noted. Inner ear structures normal. 14 mm T2 hypointense lesion noted within the left parotid gland (series 11, image 14). Other: None. IMPRESSION: 1. Similar appearance of left parietal intraparenchymal hematoma measuring 2.7 x 4.8 x 3.9 cm. Localized vasogenic edema without significant regional mass effect. No  underlying mass lesion or other abnormality. 2. Small volume acute subarachnoid hemorrhage involving the bilateral cerebral hemispheres as above. 3. Moderate cerebral atrophy with chronic small vessel ischemic disease. 4. 14 mm lesion positioned within the left parotid gland, indeterminate. Follow-up examination with dedicated neck CT with contrast suggested for further evaluation on a nonemergent basis. Electronically Signed   By: Rise Mu M.D.   On: 09/01/2017 06:40   Ct Head Code Stroke Wo Contrast  Result Date: 09/01/2017 CLINICAL DATA:  Code stroke. Initial evaluation for acute right-sided weakness. EXAM: CT HEAD WITHOUT CONTRAST TECHNIQUE: Contiguous axial images were obtained from the base of the skull through the vertex without intravenous contrast. COMPARISON:  None. FINDINGS: Brain: There is an acute intraparenchymal hematoma centered at the left parietal convexity measuring 4.2 x 2.5 x 3.1 cm (Estimated volume 16 cc). Mild localized edema without significant regional mass effect. Adjacent scattered small volume subarachnoid hemorrhage. Additional small volume subarachnoid overlies the right cerebral convexity as well (Series 3, image 21). No other acute intracranial hemorrhage. No acute large vessel territory infarct. Moderate cerebral atrophy with chronic small vessel ischemic disease. No mass lesion or midline shift. No hydrocephalus. No extra-axial fluid collection. Vascular: No asymmetric hyperdense vessel. Scattered vascular calcifications noted within the carotid siphons. Skull: Scalp soft tissues and calvarium within normal limits. Sinuses/Orbits: Globes and orbital soft tissues within normal  limits. Retention cyst present within the left maxillary sinus. Paranasal sinuses otherwise largely clear. Trace left mastoid effusion noted. Right mastoid air cells clear. Other: None. IMPRESSION: 1. 4.2 x 2.5 x 3.1 cm left parietal lobe intraparenchymal hematoma (estimated volume 16 cc).  Mild localized vasogenic edema without significant regional mass effect. 2. Small volume acute subarachnoid hemorrhage within the adjacent left parietal lobe as well as at the anterior right frontal lobe. 3. Moderate cerebral atrophy with chronic small vessel ischemic disease. Critical Value/emergent results were called by telephone at the time of interpretation on 09/01/2017 at 3:21 am to Dr. Otelia Limes , who verbally acknowledged these results. Electronically Signed   By: Rise Mu M.D.   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 Does the need for close, 24 hr/day medical supervision in concert with the patient's rehab needs make it unreasonable for this patient to be served in a less intensive setting? Yes 2. Co-Morbidities requiring supervision/potential complications: history of hypertension, coronary artery disease status post CABG 3. Due to bladder management, bowel management, safety, skin/wound care, disease management, medication administration, pain management and patient education, does the patient require 24 hr/day rehab nursing? Yes 4. Does the patient require coordinated care of a physician, rehab nurse, PT (1-2 hrs/day, 5 days/week), OT (1-2 hrs/day, 5 days/week) and SLP (.5-1 hrs/day, 5 days/week) to address physical and functional deficits in the context of the above medical diagnosis(es)? PT (1-2 hrs/day, 5 days/week), OT (1-2 hrs/day, 5 days/week) and SLP (.5-1 hrs/day, 5 days/week) Addressing deficits in the following areas: balance, endurance, locomotion, strength, transferring, bowel/bladder control, bathing, dressing, feeding, grooming, toileting, cognition, speech, language and psychosocial support 5. Can the patient actively participate in an intensive therapy program of at least 3 hrs of therapy per day at least 5 days per week? Yes 6. The potential for patient to  make measurable gains while on inpatient rehab is good 7. Anticipated functional outcomes upon discharge from inpatient rehab are supervision and min assist  with PT, supervision and min assist with OT, supervision with SLP. 8. Estimated rehab length of stay to reach the above functional goals is: 19-22d 9. Anticipated D/C setting: Home 10. Anticipated post D/C treatments: HH therapy 11. Overall Rehab/Functional Prognosis: excellent  RECOMMENDATIONS: This patient's condition is appropriate for continued rehabilitative care in the following setting: CIR Patient has agreed to participate in recommended program. Yes Note that insurance prior authorization may be required for reimbursement for recommended care.  Comment: discussed rehabilitation process and overall timeframe as well as progression through rehabilitation continuum with pt and family  Erick Colace M.D. [facility] FAAPM&R (Sports Med, Neuromuscular Med) Diplomate Am Board of Electrodiagnostic Med  Charlton Amor., PA-C 09/02/2017    Revision History              Routing History

## 2017-09-09 NOTE — Care Management Important Message (Signed)
Important Message  Patient Details  Name: Jaime Pierce MRN: [id number] Date of Birth: [birth year]   Medicare Important Message Given:  Yes    Kyla BalzarineShealy, Meleana Commerford Abena 09/09/2017, 12:16 PM

## 2017-09-09 NOTE — Progress Notes (Signed)
I have received approval through the expedited appeal process for an inpt rehab admission. I contacted Dr. Pearlean BrownieSethi and we are arranging admit today. RN CM and SW are aware. Pt and his wife in agreement. I will make the arrangements to admit today. [phone]

## 2017-09-09 NOTE — Discharge Summary (Signed)
Stroke Discharge Summary  Patient ID: Jaime Pierce   MRN: [id number]      DOB: February 02, 1946  Date of Admission: 09/01/2017 Date of Discharge: 09/09/2017  Attending Physician:  Micki Riley, MD, Stroke MD Consultant(s):   None Patient's PCP:  Tresa Garter, MD  Discharge Diagnoses:  Principal Problem:   ICH (intracerebral hemorrhage) (HCC) Active Problems:   CAD (coronary artery disease)   HTN (hypertension)   Dyslipidemia   Smoker   SAH (subarachnoid hemorrhage) (HCC)   B12 deficiency  Past Medical History:  Diagnosis Date  . Coronary artery disease   . Dyspnea on exertion   . Hyperlipidemia   . Hypertension   . Tobacco abuse    Past Surgical History:  Procedure Laterality Date  . CORONARY ARTERY BYPASS GRAFT      Medications to be continued on Rehab .  stroke: mapping our early stages of recovery book   Does not apply Once  . baclofen  5 mg Oral TID  . metoprolol tartrate  25 mg Oral Daily  . mometasone-formoterol  2 puff Inhalation BID  . pantoprazole  40 mg Oral Daily  . senna-docusate  1 tablet Oral BID  . vitamin B-12  1,000 mcg Oral Daily    LABORATORY STUDIES CBC    Component Value Date/Time   WBC 12.4 (H) 09/07/2017 0628   RBC 4.70 09/07/2017 0628   HGB 14.5 09/07/2017 0628   HCT 42.9 09/07/2017 0628   PLT 241 09/07/2017 0628   MCV 91.3 09/07/2017 0628   MCH 30.9 09/07/2017 0628   MCHC 33.8 09/07/2017 0628   RDW 13.6 09/07/2017 0628   LYMPHSABS 3.3 09/01/2017 0250   MONOABS 0.8 09/01/2017 0250   EOSABS 0.3 09/01/2017 0250   BASOSABS 0.1 09/01/2017 0250   CMP    Component Value Date/Time   NA 131 (L) 09/07/2017 0628   K 3.8 09/07/2017 0628   CL 95 (L) 09/07/2017 0628   CO2 26 09/07/2017 0628   GLUCOSE 112 (H) 09/07/2017 0628   GLUCOSE 113 (H) 09/21/2006 0832   BUN 15 09/07/2017 0628   CREATININE 0.84 09/07/2017 0628   CALCIUM 8.7 (L) 09/07/2017 0628   PROT 6.3 (L) 09/01/2017 0250   ALBUMIN 3.9 09/01/2017 0250   AST 17 09/01/2017 0250   ALT 22 09/01/2017 0250   ALKPHOS 55 09/01/2017 0250   BILITOT 0.9 09/01/2017 0250   GFRNONAA >60 09/07/2017 0628   GFRAA >60 09/07/2017 0628   COAGS Lab Results  Component Value Date   INR 1.02 09/01/2017   Lipid Panel    Component Value Date/Time   CHOL 131 09/02/2017 0314   TRIG 73 09/02/2017 0314   TRIG 65 09/21/2006 0832   HDL 35 (L) 09/02/2017 0314   CHOLHDL 3.7 09/02/2017 0314   VLDL 15 09/02/2017 0314   LDLCALC 81 09/02/2017 0314   HgbA1C  Lab Results  Component Value Date   HGBA1C 5.9 (H) 09/02/2017   Urinalysis    Component Value Date/Time   COLORURINE YELLOW 09/04/2017 0900   APPEARANCEUR CLEAR 09/04/2017 0900   LABSPEC 1.024 09/04/2017 0900   PHURINE 5.0 09/04/2017 0900   GLUCOSEU NEGATIVE 09/04/2017 0900   GLUCOSEU NEGATIVE 07/01/2017 0938   HGBUR SMALL (A) 09/04/2017 0900   BILIRUBINUR NEGATIVE 09/04/2017 0900   KETONESUR NEGATIVE 09/04/2017 0900   PROTEINUR NEGATIVE 09/04/2017 0900   UROBILINOGEN 0.2 07/01/2017 0938   NITRITE NEGATIVE 09/04/2017 0900   LEUKOCYTESUR TRACE (A) 09/04/2017 0900  Urine Drug Screen     Component Value Date/Time   LABOPIA NONE DETECTED 09/04/2017 0900   COCAINSCRNUR NONE DETECTED 09/04/2017 0900   LABBENZ NONE DETECTED 09/04/2017 0900   AMPHETMU NONE DETECTED 09/04/2017 0900   THCU NONE DETECTED 09/04/2017 0900   LABBARB NONE DETECTED 09/04/2017 0900    Alcohol Level    Component Value Date/Time   ETH <10 09/01/2017 0250     SIGNIFICANT DIAGNOSTIC STUDIES Ct Head Wo Contrast 09/01/2017 4.2 x 2.5 x 3.1 cm left parietal lobe intraparenchymal hematoma (estimated volume 16 cc). Mild localized vasogenic edema without significant regional mass effect. 2. Small volume acute subarachnoid hemorrhage within the adjacent left parietal lobe as well as at the anterior right frontal lobe. 3. Moderate cerebral atrophy with chronic small vessel ischemic disease. 09/02/2017 1. Interval increase in  size of left parietal lobe intraparenchymal hematoma, now measuring 4.7 x 5.4 x 4.9 cm (estimated volume 62 mL, previously 4.2 x 2.5 x 3.1 cm - 16 mL). Increased localized vasogenic edema and regional mass effect without midline shift. 2. Persistent small volume acute subarachnoid hemorrhage within the left parietotemporal region and right frontal lobe. 3. No other new acute intracranial abnormality. 09/04/2017 1. Left parietal parenchymal hematoma has a stable morphology but does measure mildly larger than prior (54 x 52 x 57 mm compared to 49 x 52 x 55 mm). Mild increase in surrounding vasogenic edema. Progressive local mass effect and ventricular deformity without herniation or entrapment. 2. Small volume subarachnoid hemorrhage in the left parietal and right frontal lobes is stable.  Ct Angio Head W Or Wo Contrast Ct Angio Neck W Or Wo Contrast 09/01/2017 1. Mild intracranial and cervical atherosclerosis without large vessel occlusion or significant proximal stenosis. 2. No evidence of cerebral aneurysm or vascular malformation. 3. Mild interval enlargement of the left parietal hematoma. No midline shift. 4. Scattered small volume subarachnoid hemorrhage.  [name] Wo Contrast 09/01/2017 1. Similar appearance of left parietal intraparenchymal hematoma measuring 2.7 x 4.8 x 3.9 cm. Localized vasogenic edema without significant regional mass effect. No underlying mass lesion or other abnormality. 2. Small volume acute subarachnoid hemorrhage involving the bilateral cerebral hemispheres as above. 3. Moderate cerebral atrophy with chronic small vessel ischemic disease. 4. 14 mm lesion positioned within the left parotid gland, indeterminate. Follow-up examination with dedicated neck CT with contrast suggested for further evaluation on a nonemergent basis.  2D Echocardiogram 09/01/2017 - Left ventricle: The cavity size was normal. Systolic function was normal. The estimated ejection  fraction was in the range of 50% to 55%. Wall motion was normal; there were no regional wall motion abnormalities. Left ventricular diastolic function parameters were normal. - Ventricular septum: Septal motion showed paradox. These changes are consistent with a post-thoracotomy state. - Aortic valve: There was trivial regurgitation. - Atrial septum: No defect or patent foramen ovale was identified.     HISTORY OF PRESENT ILLNESS VICTORIOUS KUNDINGER is an 71 y.o. male who presents to the ED after experiencing acute onset of RUE weakness and incoordination during coitus. Initially noticed by his wife when he flailed his RUE which struck her. She also noted right facial droop. Following this, it was noted that he was dragging his right leg when he tried to walk. LKN was the same as TOSO, which was 1:30 AM. The patient's BP was 170/90 on EMS arrival. CBG was unremarkable. Symptoms improved en route, but were still present on arrival to the ED.   Home medications include ASA.  He is not on a blood thinner.   PMHx includes CAD s/p CABG x 6, HLD, HTN and tobacco use.   HOSPITAL COURSE Mr. Elza RafterJeffers was admitted to the Neuro ICU for close monitoring and blood pressure control due to lobar intraparenchymal hemorrhage. There was contralateral SAH suggesting a contrecoup injury but no history of trauma.  His initial strength deficit was limited to the RUE but he suffered interval worsening with RLE involvement as well. This corresponded to hematoma enlargement radiographically. Cytotoxic edema was significant but did not require hypertonic saline. He subsequently stabilized and was transferred to the medical floor and [facility] due to acute deficits with previously good functional status. [facility] due to concern for his capacity to tolerate aggressive therapy, but subsequently approved days later.  DISCHARGE EXAM Blood pressure 124/62, pulse 90,  temperature 98.4 F (36.9 C), temperature source Oral, resp. rate 18, height 6' (1.829 m), weight 187 lb 9.8 oz (85.1 kg), SpO2 95 %. General - Well nourished, well developed, in no apparent distress. Skin - Erythematous rash present over both buttocks and proximal R posterior thigh, in area of diminished light touch sensation  Mental Status -  Level of arousal and orientation to time, place, and person were intact. Language was intact conversationally Attention span and concentration were normal.   Cranial Nerves II - XII - II - Visual field intact OU. III, IV, VI - Extraocular movements intact except mild right gaze restriction V - Facial sensation intact bilaterally. VII - mild R facial droop VIII - Hearing intact bilaterally X - Palate elevates symmetrically. XI - Chin turning & shoulder shrug intact bilaterally. XII - Tongue protrusion intact and midline.  Motor Strength - Strength is 0/5 in RUE and 3/5 in RLE to painful stimuli. Left sided strength was intact. Increased tone on the RUE and RLE Reflexes - No pathological reflexes. Sensory - Touch sensation remains present but decreased in RUE and RLE. Coordination - intact left UE and LE.  Gait and Station - Not tested  Discharge Diet  Diet Heart Room service appropriate? Yes; Fluid consistency: Thin Diet - low sodium heart healthy liquids  DISCHARGE PLAN  Disposition:  [facility] for ongoing PT, OT and ST  No antithrombotic for secondary stroke prevention. Avoid anticoagulation and antiplatelet therapy due to spontaneous ICH  May require titration of hypertension medication long term goal <130/90  Continue to encourage smoking abstinence  Recommend ongoing risk factor control by Primary Care Physician at time of discharge from inpatient rehabilitation.  Follow-up Plotnikov, Georgina QuintAleksei V, MD in 2 weeks following discharge from rehab.  Follow-up with Dr. Delia HeadyPramod Deania Siguenza, Stroke Clinic in 6  weeks, office to schedule an appointment.   < 30 minutes were spent preparing discharge.  Fuller Planhristopher W Rice, MD PGY-III Internal Medicine Resident Pager# [phone] 09/09/2017, 6:09 PM  I have personally examined this patient, reviewed notes, independently viewed imaging studies, participated in medical decision making and plan of care.ROS completed by me personally and pertinent positives fully documented  I have made any additions or clarifications directly to the above note. Agree with note above.    Delia HeadyPramod Lynette Noah, MD Medical Director [facility] Pager: [phone] 09/13/2017 10:12 AM

## 2017-09-09 NOTE — Progress Notes (Signed)
Jaime Pierce medicare has denied approval for inpt rehab . I have begun expedited appeal and wife is now also doing family appeal. RN CM and SW are aware. [phone]

## 2017-09-10 ENCOUNTER — Telehealth: Payer: Self-pay

## 2017-09-10 ENCOUNTER — Inpatient Hospital Stay (HOSPITAL_COMMUNITY): Payer: Medicare HMO

## 2017-09-10 ENCOUNTER — Inpatient Hospital Stay (HOSPITAL_COMMUNITY): Payer: Medicare HMO | Admitting: Speech Pathology

## 2017-09-10 DIAGNOSIS — M7989 Other specified soft tissue disorders: Secondary | ICD-10-CM

## 2017-09-10 DIAGNOSIS — E871 Hypo-osmolality and hyponatremia: Secondary | ICD-10-CM

## 2017-09-10 DIAGNOSIS — I612 Nontraumatic intracerebral hemorrhage in hemisphere, unspecified: Secondary | ICD-10-CM

## 2017-09-10 MED ORDER — HYDROCORTISONE 1 % EX OINT
TOPICAL_OINTMENT | Freq: Three times a day (TID) | CUTANEOUS | Status: DC | PRN
  Administered 2017-09-10 – 2017-09-12 (×4): via TOPICAL
  Filled 2017-09-10: qty 28.35

## 2017-09-10 MED ORDER — BACLOFEN 10 MG PO TABS
10.0000 mg | ORAL_TABLET | Freq: Two times a day (BID) | ORAL | Status: DC
  Administered 2017-09-10 – 2017-09-14 (×8): 10 mg via ORAL
  Filled 2017-09-10 (×7): qty 1

## 2017-09-10 NOTE — IPOC Note (Addendum)
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)   Gait disturbance, post-stroke   Aphasia as late effect of stroke   Intraparenchymal hemorrhage of brain (HCC)     Functional Problem List: Nursing Bowel, Motor, Skin Integrity  PT Balance, Edema, Endurance, Motor, Pain, Perception, Safety, Sensory, Skin Integrity  OT Balance, Cognition, Endurance, Motor, Pain, Perception, Safety, Sensory, Vision  SLP Cognition  TR         Basic ADL's: OT Grooming, Bathing, Eating, Dressing, Toileting     Advanced  ADL's: OT       Transfers: PT Bed Mobility, Bed to Chair, Car, Occupational psychologist, Research scientist (life sciences): PT Ambulation, Psychologist, prison and probation services, Stairs     Additional Impairments: OT Fuctional Use of Upper Extremity  SLP Social Cognition, Communication comprehension, expression Social Interaction, Problem Solving, Memory, Attention, Awareness  TR      Anticipated Outcomes Item Anticipated Outcome  Self Feeding S  Swallowing      Basic self-care  S- UB dressing; MIN A LB dressing  Toileting  MOD A toileting; MIN A bathing   Bathroom Transfers MIN A   Bowel/Bladder  Maintain continence and regain regularity.  Transfers  min assist basic transfers  Locomotion  supervision w/c mobility; mod assist gait in controlled environment  Communication  Supervision  Cognition  Supervision  Pain  <3.  Safety/Judgment  No falls. Continue to cooperate and maintain judgement.   Therapy Plan: PT Intensity: Minimum of 1-2 x/day ,45 to 90 minutes PT Frequency: 5 out of 7 days PT Duration Estimated Length of Stay: 21-28 days OT Intensity: Minimum of 1-2 x/day, 45 to 90  minutes OT Frequency: 5 out of 7 days OT Duration/Estimated Length of Stay: 3-4 weeks SLP Intensity: Minumum of 1-2 x/day, 30 to 90 minutes SLP Frequency: 3 to 5 out of 7 days SLP Duration/Estimated Length of Stay: 21-28 days     Team Interventions: Nursing Interventions Patient/Family Education, Bowel Management, Skin Care/Wound Management  PT interventions Ambulation/gait training, Balance/vestibular training, Cognitive remediation/compensation, Discharge planning, Community reintegration, Disease management/prevention, DME/adaptive equipment instruction, Functional mobility training, Functional electrical stimulation, Neuromuscular re-education, Pain management, Patient/family education, Psychosocial support, Skin care/wound management, Splinting/orthotics, Stair training, Therapeutic Activities, Therapeutic Exercise, UE/LE Strength taining/ROM, UE/LE Coordination activities, Wheelchair propulsion/positioning, Visual/perceptual remediation/compensation  OT Interventions Balance/vestibular training, Discharge planning, Functional electrical stimulation, Pain management, Self Care/advanced ADL retraining, Therapeutic Activities, UE/LE Coordination activities, Therapeutic Exercise, Visual/perceptual remediation/compensation, Patient/family education, Functional mobility training, Disease mangement/prevention, Cognitive remediation/compensation, Community reintegration, Fish farm manager, Neuromuscular re-education, Psychosocial support, Splinting/orthotics, UE/LE Strength taining/ROM, Wheelchair propulsion/positioning, Skin care/wound managment  SLP Interventions Cognitive remediation/compensation, Financial trader, Functional tasks, Patient/family education, Therapeutic Activities, Environmental controls, Internal/external aids, Speech/Language facilitation  TR Interventions    SW/CM Interventions Discharge Planning, Psychosocial Support, Patient/Family Education   Barriers to  Discharge MD  Medical stability  Nursing      PT Inaccessible home environment, Decreased caregiver support, New oxygen has stairs to enter home; wife likely need to be able to perform min assist level of care  OT Inaccessible home environment 2 stair; tone in RLE/UE  SLP      SW       Team Discharge Planning: Destination: PT-Home ,OT- Home , SLP-Home Projected Follow-up: PT-Home  health PT, 24 hour supervision/assistance, OT-  Home health OT, SLP-Outpatient SLP, Home Health SLP, 24 hour supervision/assistance Projected Equipment Needs: PT-To be determined, Wheelchair (measurements), Wheelchair cushion (measurements), OT- 3 in 1 bedside comode, To be determined, SLP-None recommended by SLP Equipment Details: PT- , OT-  Patient/family involved in discharge planning: PT- Patient, Family Adult nursemember/caregiver,  OT-Patient, Family member/caregiver, SLP-Patient, Family member/caregiver  MD ELOS: 21-28 days Medical Rehab Prognosis:  Excellent Assessment: The patient has been admitted for CIR therapies with the diagnosis of left ICH with right spastic hemiparesis. The team will be addressing functional mobility, strength, stamina, balance, safety, adaptive techniques and equipment, self-care, bowel and bladder mgt, patient and caregiver education, NMR, spasticity mgt, orthotics, w/c education, communication, cognition, swallowing. Goals have been set at min to mod assist with basic self-care and ADL's and min assist for transfers and w/c level mobility.    Ranelle OysterZachary T. Mildred Tuccillo, MD, FAAPMR      See Team Conference Notes for weekly updates to the plan of care

## 2017-09-10 NOTE — Evaluation (Signed)
Physical Therapy Assessment and Plan  Patient Details  Name: Jaime Pierce MRN: [id number] Date of Birth: Sep 25, 1946  PT Diagnosis: Abnormal posture, Difficulty walking, Edema, Hemiparesis dominant, Hypertonia, Impaired cognition, Impaired sensation, Muscle weakness and Pain in joint Rehab Potential: Good ELOS: 21-28 days   Today's Date: 09/10/2017 PT Individual Time: 1100-1200 PT Individual Time Calculation (min): 60 min    Problem List:  Patient Active Problem List   Diagnosis Date Noted  . Intraparenchymal hemorrhage of brain ([place]) 09/09/2017  . 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 Gait disturbance, post-stroke   . Aphasia as late effect of stroke   . SAH (subarachnoid hemorrhage) ([place]) 09/02/2017  . B12 deficiency 09/02/2017  . ICH (intracerebral hemorrhage) ([place]) 09/01/2017  . Well adult exam 06/16/2016  . Cigarette smoker 02/23/2016  . COPD GOLD II  12/24/2014  . CAD (coronary artery disease) 07/22/2011  . HTN (hypertension) 07/22/2011  . Dyslipidemia 07/22/2011  . Smoker 07/22/2011    Past Medical History:  Past Medical History:  Diagnosis Date  . Coronary artery disease   . Dyspnea on exertion   . Hyperlipidemia   . Hypertension   . Tobacco abuse    Past Surgical History:  Past Surgical History:  Procedure Laterality Date  . CORONARY ARTERY BYPASS GRAFT      Assessment & Plan Clinical Impression: Patient is a 71 y.o.right handed malewith history of CAD with CABG, hypertension, tobacco abuse. Per chart review patient lives with spouse. Independent and active. Wife is also retired. One level home 2 steps to entry. Presented 09/01/2017 with acute onset of right sided weakness and facial droop. Blood pressure 170/90. CT/MRI showed a 4.2 x 2.5 x 3.1 cm left parietal lobe intraparenchymal hematoma. Small-volume acute subarachnoid hemorrhage was in the adjacent left parietal lobe as well as at  the anterior right frontal lobe. CT angiogram head and neck with no evidence of aneurysm or vascular malformation. Follow-up neurology services with conservative care. Echocardiogram with ejection fraction of 55% no wall motion abnormalities. Follow-up cranial CT scan 09/02/2017 showed a mild interval increase in size of left parietal lobe intraparenchymal hematoma measuring 4.7 x 5.4 x 4.9 cm. No other acute intracranial abnormality. Tolerating a regular consistency diet. Patient transferred to CIR on 09/09/2017 .   Patient currently requires max +2 with mobility secondary to muscle weakness, muscle joint tightness and muscle paralysis, decreased cardiorespiratoy endurance and decreased oxygen support, impaired timing and sequencing, abnormal tone, unbalanced muscle activation, motor apraxia, decreased coordination and decreased motor planning, decreased midline orientation, decreased attention to right and decreased motor planning, decreased initiation, decreased attention, decreased awareness, decreased problem solving, decreased safety awareness, decreased memory and delayed processing and decreased sitting balance, decreased standing balance, decreased postural control, hemiplegia and decreased balance strategies.  Prior to hospitalization, patient was independent  with mobility and lived with Spouse in a House home.  Home access is 2Stairs to enter.  Patient will benefit from skilled PT intervention to maximize safe functional mobility, minimize fall risk and decrease caregiver burden for planned discharge home with 24 hour supervision.  Anticipate patient will benefit from follow up [place] at discharge.  PT - End of Session Activity Tolerance: Decreased this session Endurance Deficit: Yes Endurance Deficit Description: pt fatigued and lethargic and start of session PT Assessment Rehab Potential (ACUTE/IP ONLY): Good PT Barriers to Discharge: Inaccessible home environment;Decreased caregiver  support;New oxygen PT Barriers to Discharge Comments: has stairs to enter home;  wife likely need to be able to perform min assist level of care PT Patient demonstrates impairments in the following area(s): Balance;Edema;Endurance;Motor;Pain;Perception;Safety;Sensory;Skin Integrity PT Transfers Functional Problem(s): Bed Mobility;Bed to Chair;Car;Furniture PT Locomotion Functional Problem(s): Ambulation;Wheelchair Mobility;Stairs PT Plan PT Intensity: Minimum of 1-2 x/day ,45 to 90 minutes PT Frequency: 5 out of 7 days PT Duration Estimated Length of Stay: 21-28 days PT Treatment/Interventions: Ambulation/gait training;Balance/vestibular training;Cognitive remediation/compensation;Discharge planning;Community reintegration;Disease management/prevention;DME/adaptive equipment instruction;Functional mobility training;Functional electrical stimulation;Neuromuscular re-education;Pain management;Patient/family education;Psychosocial support;Skin care/wound management;Splinting/orthotics;Stair training;Therapeutic Activities;Therapeutic Exercise;UE/LE Strength taining/ROM;UE/LE Coordination activities;Wheelchair propulsion/positioning;Visual/perceptual remediation/compensation PT Transfers Anticipated Outcome(s): min assist basic transfers PT Locomotion Anticipated Outcome(s): supervision w/c mobility; mod assist gait in controlled environment PT Recommendation Recommendations for Other Services: Therapeutic Recreation consult Follow Up Recommendations: Home health PT;24 hour supervision/assistance Patient destination: Home Equipment Recommended: To be determined;Wheelchair (measurements);Wheelchair cushion (measurements)  Skilled Therapeutic Intervention Evaluation completed (see details above and below) with education on PT POC and goals and individual treatment initiated with focus on NMR to address postural control, reorientation to midline, tone, pelvic and trunk dissociation during bed mobility,  transfers, scooting in w/c, and sit <> stand attempts. Attempted squat pivot and slideboard transfers but requires +2 assist for safety and max cues for sequencing, attention, and motor planning. Increased tone in RLE and RUE limiting mobility as well. Unable to complete full sit <.> stand during session. W/c propulsion training with hemi-technique with max cues for technique and assist for steering/turning. Limited by fatigue.    PT Evaluation Precautions/Restrictions Precautions Precautions: Fall Precaution Comments: right sided weakness, right inattention Restrictions Weight Bearing Restrictions: No    Vital Signs sats = 94% on 1 L during session Pain Reports generalized pain and with movement in RUE/RLE due to increased tone Home Living/Prior Functioning Home Living Available Help at Discharge: Family;Available 24 hours/day Type of Home: House Home Access: Stairs to enter [employer] of Steps: 2 Entrance Stairs-Rails: None Home Layout: One level Bathroom Shower/Tub: Multimedia programmer: Standard Bathroom Accessibility: Yes Additional Comments: liked to play golf  Lives With: Spouse Prior Function Level of Independence: Independent with basic ADLs;Independent with gait;Independent with transfers  Able to Take Stairs?: Yes Vocation: Retired Vision/Perception  Vision - Assessment Additional Comments: decreased attention to R; difficult to assess 2/2 cognition Perception Perception: Impaired Praxis Praxis: Impaired Praxis Impairment Details: Motor planning  Cognition Overall Cognitive Status: Impaired/Different from baseline Arousal/Alertness: Awake/alert Attention: Selective Selective Attention: Impaired Selective Attention Impairment: Functional basic Memory: Impaired Problem Solving: Impaired Executive Function: Sequencing Sequencing: Impaired Sequencing Impairment: Psychologist, educational Light Touch: Impaired Detail Light  Touch Impaired Details: Impaired RUE;Impaired RLE Proprioception: Impaired Detail Proprioception Impaired Details: Impaired RUE;Impaired RLE Coordination Gross Motor Movements are Fluid and Coordinated: No Motor  Motor Motor: Hemiplegia;Abnormal tone;Abnormal postural alignment and control     Trunk/Postural Assessment  Cervical Assessment Cervical Assessment: Exceptions to [facility] (forward head) Thoracic Assessment Thoracic Assessment: Exceptions to [facility] (flexed posture and pushes to R) Lumbar Assessment Lumbar Assessment: Exceptions to [facility] (posterior pelvic tilt; decreased mobility) Postural Control Postural Control: Deficits on evaluation Trunk Control: tendency to push to the R; poor trunk control or awareness Righting Reactions: delayed and inadequate  Balance Balance Balance Assessed: Yes Static Sitting Balance Static Sitting - Level of Assistance: 4: Min assist;3: Mod assist;5: Stand by assistance Dynamic Sitting Balance Dynamic Sitting - Level of Assistance: 3: Mod assist;2: Max assist Static Standing Balance Static Standing - Level of Assistance: 1: +2 Total assist (unable to achieve full upright posture) Extremity Assessment  RUE Assessment RUE Assessment: Exceptions to [facility] (R hemi; increased tone, PROM min-mod ranges) LUE Assessment LUE Assessment: Within Functional Limits RLE Assessment RLE Assessment: Exceptions to [facility] RLE Strength RLE Overall Strength Comments: unable to formally assess due to cognitive impairments;  occasional movement noted for hip flexion and knee extension; tone limiting full ROM; no active ankle movement RLE Tone RLE Tone Comments: increased tone noted LLE Assessment LLE Assessment: Exceptions to [facility] LLE Strength LLE Overall Strength Comments: difficult to fully assess due to cognitive impairments but active movement noted in all joints   See Function Navigator for Current Functional Status.   Refer to Care Plan for Long Term  Goals  Recommendations for other services: Therapeutic Recreation  Other co-treatment/engagement to return to PLOF  Discharge Criteria: Patient will be discharged from PT if patient refuses treatment 3 consecutive times without medical reason, if treatment goals not met, if there is a change in medical status, if patient makes no progress towards goals or if patient is discharged from hospital.  The above assessment, treatment plan, treatment alternatives and goals were discussed and mutually agreed upon: by patient and by family  Juanna Cao, PT, DPT  09/10/2017, 3:20 PM

## 2017-09-10 NOTE — Progress Notes (Signed)
Patient information reviewed and entered into eRehab system by Taegen Lennox, RN, CRRN, PPS Coordinator.  Information including medical coding and functional independence measure will be reviewed and updated through discharge.     Per nursing patient was given "Data Collection Information Summary for Patients in Inpatient Rehabilitation Facilities with attached "Privacy Act Statement-Health Care Records" upon admission.  Present in education notebook.  

## 2017-09-10 NOTE — Evaluation (Signed)
Occupational Therapy Assessment and Plan  Patient Details  Name: Jaime Pierce MRN: [id number] Date of Birth: 1946/10/17  OT Diagnosis: abnormal posture, apraxia, cognitive deficits, disturbance of vision, hemiplegia affecting dominant side and muscle weakness (generalized) Rehab Potential: Rehab Potential (ACUTE ONLY): Fair ELOS: 3-4 weeks   Today's Date: 09/10/2017 OT Individual Time: 0900-1000 OT Individual Time Calculation (min): 60 min     Problem List:  Patient Active Problem List   Diagnosis Date Noted  . Intraparenchymal hemorrhage of brain ([place]) 09/09/2017  . 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 Gait disturbance, post-stroke   . Aphasia as late effect of stroke   . SAH (subarachnoid hemorrhage) ([place]) 09/02/2017  . B12 deficiency 09/02/2017  . ICH (intracerebral hemorrhage) ([place]) 09/01/2017  . Well adult exam 06/16/2016  . Cigarette smoker 02/23/2016  . COPD GOLD II  12/24/2014  . CAD (coronary artery disease) 07/22/2011  . HTN (hypertension) 07/22/2011  . Dyslipidemia 07/22/2011  . Smoker 07/22/2011    Past Medical History:  Past Medical History:  Diagnosis Date  . Coronary artery disease   . Dyspnea on exertion   . Hyperlipidemia   . Hypertension   . Tobacco abuse    Past Surgical History:  Past Surgical History:  Procedure Laterality Date  . CORONARY ARTERY BYPASS GRAFT      Assessment & Plan Clinical Impression: Jaime Pierce a 71 y.o.right handed malewith history of CAD with CABG, hypertension, tobacco abuse. Per chart review patient lives with spouse. Independent and active. Wife is also retired. One level home 2 steps to entry. Presented 09/01/2017 with acute onset of right sided weakness and facial droop. Blood pressure 170/90. CT/MRI showed a 4.2 x 2.5 x 3.1 cm left parietal lobe intraparenchymal hematoma. Small-volume acute subarachnoid hemorrhage was in the adjacent left  parietal lobe as well as at the anterior right frontal lobe. CT angiogram head and neck with no evidence of aneurysm or vascular malformation. Follow-up neurology services with conservative care. Echocardiogram with ejection fraction of 55% no wall motion abnormalities. Follow-up cranial CT scan 09/02/2017 showed a mild interval increase in size of left parietal lobe intraparenchymal hematoma measuring 4.7 x 5.4 x 4.9 cm. No other acute intracranial abnormality. Tolerating a regular consistency diet. Physical and occupationaltherapy evaluationscompleted with recommendations of physical medicine rehabilitation consult.Patient was admitted for a comprehensive rehabilitation program  .    Patient currently requires max with basic self-care skills secondary to muscle weakness, decreased cardiorespiratoy endurance and decreased oxygen support, impaired timing and sequencing, abnormal tone, motor apraxia, decreased coordination and decreased motor planning, decreased midline orientation and decreased attention to right, decreased initiation, decreased attention, decreased awareness, decreased problem solving, decreased safety awareness, decreased memory and delayed processing and decreased sitting balance, decreased standing balance, decreased postural control, hemiplegia and decreased balance strategies.  Prior to hospitalization, patient could complete BADL with independent .  Patient will benefit from skilled intervention to decrease level of assist with basic self-care skills and increase independence with basic self-care skills prior to discharge home with care partner.  Anticipate patient will require 24 hour supervision and minimal physical assistance and follow up home health.  OT - End of Session Endurance Deficit: Yes Endurance Deficit Description: pt fatigued and lethargic and start of session OT Assessment Rehab Potential (ACUTE ONLY): Fair OT Barriers to Discharge: Inaccessible home  environment OT Barriers to Discharge Comments: 2 stair; tone in RLE/UE OT Patient demonstrates impairments in the following  area(s): Balance;Cognition;Endurance;Motor;Pain;Perception;Safety;Sensory;Vision OT Basic ADL's Functional Problem(s): Grooming;Bathing;Eating;Dressing;Toileting OT Transfers Functional Problem(s): Toilet;Tub/Shower OT Additional Impairment(s): Fuctional Use of Upper Extremity OT Plan OT Intensity: Minimum of 1-2 x/day, 45 to 90 minutes OT Frequency: 5 out of 7 days OT Duration/Estimated Length of Stay: 3-4 weeks OT Treatment/Interventions: Balance/vestibular training;Discharge planning;Functional electrical stimulation;Pain management;Self Care/advanced ADL retraining;Therapeutic Activities;UE/LE Coordination activities;Therapeutic Exercise;Visual/perceptual remediation/compensation;Patient/family education;Functional mobility training;Disease mangement/prevention;Cognitive remediation/compensation;Community reintegration;DME/adaptive equipment instruction;Neuromuscular re-education;Psychosocial support;Splinting/orthotics;UE/LE Strength taining/ROM;Wheelchair propulsion/positioning;Skin care/wound managment OT Self Feeding Anticipated Outcome(s): S OT Basic Self-Care Anticipated Outcome(s): S- UB dressing; MIN A LB dressing OT Toileting Anticipated Outcome(s): MOD A toileting; MIN A bathing OT Bathroom Transfers Anticipated Outcome(s): MIN A  OT Recommendation Patient destination: Home Follow Up Recommendations: Home health OT Equipment Recommended: 3 in 1 bedside comode;To be determined   Skilled Therapeutic Intervention 1:1. Wife and pt educated on role/purpose of OT, CIR, ELOS, and POC. Pt inconsistent with command folloing requiring increased time, tactile cues and manual facilitation for sequencing all transitional movements. OT threads BLE into underwear and pt rolls B with min-MOD A as OT advances pants past feet. Pt c/o pain when moving RLE 2/2 tone. Pt  supine>sitting EOB with MOD A for RLE/UE management and trunk elevation. Pt squat pivot transfer with MAX A EOB>w/c with manual facilitation of trunk flexion and R knee blocking. Pt bathes with VC for sequencing bathing body parts, HOH A to wash LUE, and A to wash B lower legs and buttocks. OT instructs on hemi techniques and pt able to thread LUE and head into shirt. Pt requires total A for LB dressing and sit to semi stand with MAX A and +2 advancing pants past hips. Pt sit to stand in stedy with MAX A to transfer to EOB with touching A for sitting balance 2/2 fatigue. Exited session with pt semi reclined in bed with call light in reach and all needs met.  OT Evaluation Precautions/Restrictions  Precautions Precautions: Fall Precaution Comments: right sided weakness, right inattention Restrictions Weight Bearing Restrictions: No General Chart Reviewed: Yes Vital Signs Therapy Vitals Pulse Rate: 93 BP: (!) 144/77 Patient Position (if appropriate): Sitting Oxygen Therapy SpO2: 94 % O2 Device: Not Delivered Pain Pain Assessment Pain Assessment: No/denies pain (pt reporting pain when moving RLE 2/2 tone) Home Living/Prior Functioning Home Living Available Help at Discharge: Family, Available 24 hours/day Type of Home: House Home Access: Stairs to enter [employer] of Steps: 2 Entrance Stairs-Rails: None Home Layout: One level Bathroom Shower/Tub: Multimedia programmer: Standard Bathroom Accessibility: Yes Additional Comments: liked to play golf  Lives With: Spouse Prior Function Level of Independence: Independent with basic ADLs, Independent with gait, Independent with transfers  Able to Take Stairs?: Yes Vocation: Retired ADL   Vision Baseline Vision/History: Wears glasses Wears Glasses: Reading only Patient Visual Report: No change from baseline Vision Assessment?: Vision impaired- to be further tested in functional context Additional Comments:  decreased attention to R; difficult to assess 2/2 cognition Perception  Perception: Impaired Praxis Praxis: Impaired Praxis Impairment Details: Motor planning Cognition Overall Cognitive Status: Impaired/Different from baseline Arousal/Alertness: Awake/alert Orientation Level: Person;Place;Situation Person: Oriented Place: Oriented Situation: Oriented Year: 2018 Month: October Day of Week: Incorrect Memory: Impaired Immediate Memory Recall: Sock;Bed;Blue Memory Recall: Blue Memory Recall Blue: Without Cue Attention: Selective Selective Attention: Impaired Selective Attention Impairment: Functional basic Problem Solving: Impaired Executive Function: Sequencing Sequencing: Impaired Sequencing Impairment: Psychologist, educational Light Touch: Impaired Detail Light Touch Impaired Details: Impaired RUE;Impaired RLE Proprioception: Impaired Detail Proprioception Impaired Details: Impaired  RUE;Impaired RLE Coordination Gross Motor Movements are Fluid and Coordinated: No Fine Motor Movements are Fluid and Coordinated: No Motor  Motor Motor: Hemiplegia;Abnormal tone;Abnormal postural alignment and control Mobility  Transfers Transfers: Sit to Stand Sit to Stand: 1: +1 Total assist Sit to Stand Details: Tactile cues for initiation;Tactile cues for sequencing;Tactile cues for weight shifting;Manual facilitation for weight shifting;Verbal cues for safe use of DME/AE;Verbal cues for precautions/safety;Verbal cues for sequencing;Manual facilitation for weight bearing;Manual facilitation for placement  Trunk/Postural Assessment  Cervical Assessment Cervical Assessment: Exceptions to [facility] (head forward) Thoracic Assessment Thoracic Assessment: Exceptions to [facility] (kyphotic) Lumbar Assessment Lumbar Assessment: Exceptions to [facility] (posterior pelvic tilt) Postural Control Postural Control: Deficits on evaluation Trunk Control: tendency to push to the R; poor trunk control or  awareness Righting Reactions: delayed and inadequate  Balance Balance Balance Assessed: Yes Static Sitting Balance Static Sitting - Level of Assistance: 4: Min assist;3: Mod assist;5: Stand by assistance Dynamic Sitting Balance Dynamic Sitting - Level of Assistance: 4: Min assist;3: Mod assist Sitting balance - Comments: postural control while completing bathing S-MOD A wiht leaning R wiht fatigue Static Standing Balance Static Standing - Level of Assistance: 1: +2 Total assist Extremity/Trunk Assessment RUE Assessment RUE Assessment: Exceptions to [facility] (R hemi; increased tone, PROM min-mod ranges) LUE Assessment LUE Assessment: Within Functional Limits   See Function Navigator for Current Functional Status.   Refer to Care Plan for Long Term Goals  Recommendations for other services: None    Discharge Criteria: Patient will be discharged from OT if patient refuses treatment 3 consecutive times without medical reason, if treatment goals not met, if there is a change in medical status, if patient makes no progress towards goals or if patient is discharged from hospital.  The above assessment, treatment plan, treatment alternatives and goals were discussed and mutually agreed upon: by patient and by family  Tonny Branch 09/10/2017, 12:45 PM

## 2017-09-10 NOTE — Progress Notes (Signed)
Patient arrived at appx 1850 alert and oriented and accompanied by wife and daughter. No c/o pain or discomfort noted and no s/s of distress. Patient and family pleasant and cooperative.

## 2017-09-10 NOTE — Evaluation (Signed)
Speech Language Pathology Assessment and Plan  Patient Details  Name: Jaime Pierce MRN: [id number] Date of Birth: [birth year]  SLP Diagnosis: Cognitive Impairments;Aphasia  Rehab Potential: Good ELOS: 21-28 days     Today's Date: 09/10/2017 SLP Individual Time: 6067-7034 SLP Individual Time Calculation (min): 60 min   Problem List:  Patient Active Problem List   Diagnosis Date Noted  . Intraparenchymal hemorrhage of brain ([facility]) 09/09/2017  . 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 Gait disturbance, post-stroke   . Aphasia as late effect of stroke   . SAH (subarachnoid hemorrhage) ([facility]) 09/02/2017  . B12 deficiency 09/02/2017  . ICH (intracerebral hemorrhage) ([facility]) 09/01/2017  . Well adult exam 06/16/2016  . Cigarette smoker 02/23/2016  . COPD GOLD II  12/24/2014  . CAD (coronary artery disease) 07/22/2011  . HTN (hypertension) 07/22/2011  . Dyslipidemia 07/22/2011  . Smoker 07/22/2011   Past Medical History:  Past Medical History:  Diagnosis Date  . Coronary artery disease   . Dyspnea on exertion   . Hyperlipidemia   . Hypertension   . Tobacco abuse    Past Surgical History:  Past Surgical History:  Procedure Laterality Date  . CORONARY ARTERY BYPASS GRAFT      Assessment / Plan / Recommendation Clinical Impression Patient is a 71 y.o.right handed malewith history of CAD with CABG, hypertension, tobacco abuse. Per chart review patient lives with spouse. Independent and active. Wife is also retired. One level home 2 steps to entry. Presented 09/01/2017 with acute onset of right sided weakness and facial droop. Blood pressure 170/90. CT/MRI showed a 4.2 x 2.5 x 3.1 cm left parietal lobe intraparenchymal hematoma. Small-volume acute subarachnoid hemorrhage was in the adjacent left parietal lobe as well as at the anterior right frontal lobe. CT angiogram head and neck with no evidence of aneurysm or  vascular malformation. Follow-up neurology services with conservative care. Echocardiogram with ejection fraction of 55% no wall motion abnormalities. Follow-up cranial CT scan 09/02/2017 showed a mild interval increase in size of left parietal lobe intraparenchymal hematoma measuring 4.7 x 5.4 x 4.9 cm. No other acute intracranial abnormality. Tolerating a regular consistency diet. Patient transferred to CIR on 09/09/2017.  Patient demonstrates moderate cognitive-linguistic impairments impacting auditory comprehension, verbal expression/word-finding at the phrase level, reading comprehension, decoding, sustained attention, awareness, problem solving, recall and overall safety with basic and familiar tasks. Patient's function is also impacted by moderate-severe motor planning deficits. Patient would benefit from skilled SLP intervention to maximize his cognitive-linguistic function ad overall functional independence prior to discharge.    Skilled Therapeutic Interventions          Administered a cognitive-linguistic evaluation. Please see above for details. Educated patient and his wife in regards to his current cognitive-linguistic impairments and goals of skilled SLP intervention. Both verbalized understanding.    SLP Assessment  Patient will need skilled [facility] Services during CIR admission    Recommendations  Oral Care Recommendations: Oral care BID Recommendations for Other Services: Neuropsych consult Patient destination: Home Follow up Recommendations: Outpatient SLP;Home Health SLP;24 hour supervision/assistance Equipment Recommended: None recommended by SLP    SLP Frequency 3 to 5 out of 7 days   SLP Duration  SLP Intensity  SLP Treatment/Interventions 21-28 days   Minumum of 1-2 x/day, 30 to 90 minutes  Cognitive remediation/compensation;Cueing hierarchy;Functional tasks;Patient/family education;Therapeutic Activities;Environmental controls;Internal/external  aids;Speech/Language facilitation    Pain Pain Assessment Pain Assessment: No/denies pain (pt reporting pain  when moving RLE 2/2 tone)  Prior Functioning Type of Home: House  Lives With: Spouse Available Help at Discharge: Family;Available 24 hours/day Vocation: Retired  Function:   Cognition Comprehension Comprehension assist level: Understands basic 50 - 74% of the time/ requires cueing 25 - 49% of the time  Expression   Expression assist level: Expresses basic 50 - 74% of the time/requires cueing 25 - 49% of the time. Needs to repeat parts of sentences.  Social Interaction Social Interaction assist level: Interacts appropriately 50 - 74% of the time - May be physically or verbally inappropriate.  Problem Solving Problem solving assist level: Solves basic 25 - 49% of the time - needs direction more than half the time to initiate, plan or complete simple activities  Memory Memory assist level: Recognizes or recalls 50 - 74% of the time/requires cueing 25 - 49% of the time   Short Term Goals: Week 1: SLP Short Term Goal 1 (Week 1): Patient will demonstrate sustained attention to functional tasks for ~30 minutes with Min A verbal cues for redirection.  SLP Short Term Goal 2 (Week 1): Patient will self-monitor and correct verbal errors at the phrase level with Min A verbal cues.  SLP Short Term Goal 3 (Week 1): Patient will follow 2 step directions with 25% accuracy and Max A verbal cues.  SLP Short Term Goal 4 (Week 1): Patient will demonstrate reading comprehension at the sentence level with Min A verbal cues.  SLP Short Term Goal 5 (Week 1): Patient will demonstrate functional problem solving for basic and familiar tasks with Mod A verbal cues.  SLP Short Term Goal 6 (Week 1): Patient will utilize word-finding strategies at the phrase level with Max A multimodal cues.   Refer to Care Plan for Long Term Goals  Recommendations for other services: Neuropsych  Discharge Criteria:  Patient will be discharged from SLP if patient refuses treatment 3 consecutive times without medical reason, if treatment goals not met, if there is a change in medical status, if patient makes no progress towards goals or if patient is discharged from hospital.  The above assessment, treatment plan, treatment alternatives and goals were discussed and mutually agreed upon: by patient and family   Adalay Azucena, [place] 09/10/2017, 3:50 PM

## 2017-09-10 NOTE — Progress Notes (Signed)
Social Work Social Work Assessment and Plan  Patient Details  Name: Jaime Pierce MRN: [id number] Date of Birth: [birth year]  Today's Date: 09/10/2017  Problem List:  Patient Active Problem List   Diagnosis Date Noted  . Hyponatremia   . Benign essential HTN   . Intraparenchymal hemorrhage of brain ([facility]) 09/09/2017  . 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 Gait disturbance, post-stroke   . Aphasia as late effect of stroke   . SAH (subarachnoid hemorrhage) ([facility]) 09/02/2017  . B12 deficiency 09/02/2017  . ICH (intracerebral hemorrhage) ([facility]) 09/01/2017  . Well adult exam 06/16/2016  . Cigarette smoker 02/23/2016  . COPD GOLD II  12/24/2014  . CAD (coronary artery disease) 07/22/2011  . HTN (hypertension) 07/22/2011  . Dyslipidemia 07/22/2011  . Smoker 07/22/2011   Past Medical History:  Past Medical History:  Diagnosis Date  . Coronary artery disease   . Dyspnea on exertion   . Hyperlipidemia   . Hypertension   . Tobacco abuse    Past Surgical History:  Past Surgical History:  Procedure Laterality Date  . CORONARY ARTERY BYPASS GRAFT     Social History:  reports that he has been smoking Cigarettes.  He has a 25.00 pack-year smoking history. He has never used smokeless tobacco. He reports that he drinks alcohol. He reports that he does not use drugs.  Family / Support Systems Marital Status: Married Patient Roles: Spouse, Parent Spouse/Significant Other: wife, Jaime Pierce @ [phone] Children: daughter, Print production plannerAmber [place] and daughter, [place] Anticipated Caregiver: wife Caregiver Availability: 24/7 Family Dynamics: Wife and daughters are very supportive.  Wife tearful when she talks about wanting "to do anything for him that I can .... 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   Abuse/Neglect Physical Abuse: Denies Verbal Abuse: Denies Sexual Abuse: Denies Exploitation of patient/patient's resources: Denies Self-Neglect: Denies  Emotional Status Pt's affect, behavior adn adjustment status: Pt lying in bed and reports he is fatigued from first day of therapies.  He attempts to answer interview questions, however, word finding issues were evident and causing him frustration.  Daughter offered assist if needed.  Family concerned about mood as pt was very independent PTA.  Have referred for neuropsychology consult. Recent Psychosocial Issues: None Pyschiatric History: None Substance Abuse History: None  Patient / Family Perceptions, Expectations & Goals Pt/Family understanding of illness & functional limitations: Pt and family with basic understanding of his hemorrhage and resulting deficits/ need for CIR. Premorbid pt/family roles/activities: Pt completely independent and active at home and community. Anticipated changes in roles/activities/participation: Anticipate pt will need 24/7 physical assist and wife to assume primary caregiver support role Pt/family expectations/goals: Pt and wife hopeful to begin to see more return in his right side.  Community Resources [employer] Agencies: None Premorbid Home Care/DME Agencies: None Transportation available at discharge: yes Resource referrals recommended: Neuropsychology, Support group (specify)  Discharge Planning Living Arrangements: Spouse/significant other Support Systems: Spouse/significant other, Children, Other relatives, Friends/neighbors Type of Residence: Private residence Insurance Resources: Secretary/administratorMedicare (Aetna Medicaid) Financial Resources: Restaurant manager, fast foodocial Security Financial Screen Referred:  No Living Expenses: Own Money Management: Patient Does the patient have any problems obtaining your medications?: No Home Management: pt and wife Patient/Family Preliminary  Plans: Pt to d/c home with wife as primary caregiver. She is in good health and can provide physical assistance Social Work Anticipated Follow Up Needs: HH/OP Expected length of stay: 3-4 weeks  Clinical Impression Unfortunate gentleman here following a Left IPH and with significant right side weakness and impairment.  Wife and daughter at bedside and very supportive.  Wife fully prepared to provide 24/7 assistance at d/c.  Pt denies any significant emotional distress, however, family is concerned.  Will refer for neuropsychology consult.  Will follow for support and d/c planning needs.  Jaime Pierce 09/10/2017, 1:47 PM

## 2017-09-10 NOTE — Progress Notes (Signed)
Initial Nutrition Assessment  DOCUMENTATION CODES:   Not applicable  INTERVENTION:  Continue Ensure Enlive po BID, each supplement provides 350 kcal and 20 grams of protein.  Encourage adequate PO intake.   NUTRITION DIAGNOSIS:   Inadequate oral intake related to poor appetite as evidenced by per patient/family report.  GOAL:   Patient will meet greater than or equal to 90% of their needs  MONITOR:   PO intake, Supplement acceptance, Labs, Weight trends, Skin, I & O's  REASON FOR ASSESSMENT:   Malnutrition Screening Tool    ASSESSMENT:   71 y.o.right handed male with history of CAD with CABG, hypertension, tobacco abuse. Presented 09/01/2017 with acute onset of right sided weakness and facial droop. CT/MRI showed a 4.2 x 2.5 x 3.1 cm left parietal lobe intraparenchymal hematoma. Small-volume acute subarachnoid hemorrhage was in the adjacent left parietal lobe as well as at the anterior right frontal lobe.   Meal completion 100% this AM. Pt unavailable with therapy at time of visit. Wife available at bedside. She reports pt with a decreased appetite however it has been improving. Wife reports average meal completion has been ~50%. Pt was eating well PTA with no other difficulties. Pt with no significant weight loss per weight records. Pt with Ensure ordered and has been consuming them. RD to continue with current orders.   Unable to complete Nutrition-Focused physical exam at this time.   Labs and medications reviewed.   Diet Order:  Diet Heart Room service appropriate? Yes; Fluid consistency: Thin  Skin:  Reviewed, no issues  Last BM:  10/15  Height:   Ht Readings from Last 1 Encounters:  09/09/17 6\' 1"  (1.854 m)    Weight:   Wt Readings from Last 1 Encounters:  09/09/17 184 lb (83.5 kg)    Ideal Body Weight:  83.6 kg  BMI:  Body mass index is 24.28 kg/m.  Estimated Nutritional Needs:   Kcal:  2000-2200  Protein:  90-100 grams  Fluid:  2-2.2  L/day  EDUCATION NEEDS:   No education needs identified at this time  Roslyn SmilingStephanie Renda Pohlman, MS, RD, LDN Pager # [phone] After hours/ weekend pager # [phone]

## 2017-09-10 NOTE — Progress Notes (Signed)
Occupational Therapy Session Note  Patient Details  Name: Jaime Pierce MRN: [id number] Date of Birth: Jun 24, 1946  1515-1600 45 min  Short Term Goals: Week 1:  OT Short Term Goal 1 (Week 1): Pt will transfer to BSC/toilet with MAX A 1 caregiver to decrease burden of care OT Short Term Goal 2 (Week 1): Pt will recall hemi dressing techniques with min question cues OT Short Term Goal 3 (Week 1): Pt will locate 4/4 items in R visual field with no more  than 1 VC OT Short Term Goal 4 (Week 1): pt will sit to stand with MAX A of 1 caregiver in prep for clothing management  Skilled Therapeutic Interventions/Progress Updates:  1:1. Pt supine>sitting EOB with MOD A for R LE and trunk facilitation with VC for sequencing and visual cues for hand placement to maintain midline when upright. Pt sit to stand in stedy with MAX A of 1and VC for terminal hip and knee extension. Pt demo adductor tone in RLE impacting BOS and balance. OT places yoga block in between B feet to facilitate normal BOS to improve sit to stand/balance. Pt transfers onto [facility] in stedy as stated above. OT provides PROM and educates pt and daughter on tone management, PROM, contracture prevention and positioning. Pt reporting "slight" pain in shoulder with flexion, horizontal abduction and abduction to 90 degrees. Pt squat pivot transfer total A with VC for sequencing, safety awareness, manual facilitation of weight shifting, and R knee blocking. Pt able to shift weight in bed with VC in trendelenburg position. OT instructs on bed postioning and exits session with daughter in room and all needs met.   Therapy Documentation Precautions:  Precautions Precautions: Fall Precaution Comments: right sided weakness, right inattention Restrictions Weight Bearing Restrictions: No General: General Chart Reviewed: Yes Vital Signs: Oxygen Therapy SpO2: 96 % O2 Device: Not Delivered  See Function Navigator for Current Functional  Status.   Therapy/Group: Individual Therapy  Tonny Branch 09/10/2017, 4:16 PM

## 2017-09-10 NOTE — Telephone Encounter (Signed)
Pt is on TCM list after admission for intracranial bleed. Since dc for hospital [facility]

## 2017-09-10 NOTE — Progress Notes (Signed)
[facility] PHYSICAL MEDICINE & REHABILITATION     PROGRESS NOTE    Subjective/Complaints: Had a fair night. Up with SLP this morning when I arrived. No pain.   ROS: pt denies nausea, vomiting, diarrhea, cough, shortness of breath or chest pain   Objective: Vital Signs: Blood pressure 137/73, pulse 84, temperature 97.7 F (36.5 C), temperature source Oral, resp. rate 20, height 6\' 1"  (1.854 m), weight 83.5 kg (184 lb), SpO2 92 %. No results found.  Recent Labs  09/09/17 1932  WBC 12.9*  HGB 14.9  HCT 41.9  PLT 279    Recent Labs  09/09/17 1932  NA 127*  K 3.7  CL 94*  GLUCOSE 131*  BUN 21*  CREATININE 0.88  CALCIUM 8.6*   CBG (last 3)  No results for input(s): GLUCAP in the last 72 hours.  Wt Readings from Last 3 Encounters:  09/09/17 83.5 kg (184 lb)  09/01/17 85.1 kg (187 lb 9.8 oz)  06/30/17 83 kg (183 lb)    Physical Exam:   Constitutional: He appears well-developed.  HENT:  Head: Normocephalic.  Eyes: EOMare normal.  Neck: Normal range of motion. Neck supple. No thyromegalypresent.  Cardiovascular: RRR without murmur. No JVD  Respiratory: CTA Bilaterally without wheezes or rales. Normal effort  GI: Soft. Bowel sounds are normal. He exhibits no distension.  Skin.warm and dry Neurological: He is alertand oriented to person, place, and time.   He followed commands. Can ID simple objects. Ideational apraxia, motor apraxia   Manual muscle testing, grossly 5/5 in the left deltoid, biceps, triceps, grip, hip flexor, knee extensor, wrist flexor Right upper extremity 0/5 in the deltoid, biceps, triceps, finger flexors and extensors, 2/5 hip/ knee extensor synergy right lower extremity. Resting tone 2/4 pec,bicep RUE, extensor tone RLE 2/4 Sensation remains absent to light touch in the right upper and right lower limb.  Oriented to person, place, year, month, but not day and date. visual fields are intact confrontation testing. No obvious right  neglect   Assessment/Plan: 1. 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 Physiatrist is providing close team supervision and 24 hour management of active medical problems listed below. Physiatrist and rehab team continue to assess barriers to discharge/monitor patient progress toward functional and medical goals.  Function:  Bathing Bathing position      Bathing parts      Bathing assist        Upper Body Dressing/Undressing Upper body dressing                    Upper body assist        Lower Body Dressing/Undressing Lower body dressing                                  Lower body assist        Toileting Toileting          Toileting assist     Transfers Chair/bed transfer             Locomotion Ambulation           Wheelchair          Cognition Comprehension Comprehension assist level: Follows basic conversation/direction with no assist  Expression Expression assist level: Expresses basic needs/ideas: With no assist  Social Interaction Social Interaction assist  level: Interacts appropriately 90% of the time - Needs monitoring or encouragement for participation or interaction.  Problem Solving Problem solving assist level: Solves basic 90% of the time/requires cueing < 10% of the time  Memory     Medical Problem List and Plan: 1. Right hemiplegiasecondary to left parietal intraparenchymal hemorrhage likely due to hypertension and small vessel disease  -beginning therapies today 2. DVT Prophylaxis/Anticoagulation: SCDs. Monitor for any signs of DVT  -check dopplers 3. Pain Management/spasticity: change to BID dosing given mid-day fatigue. Will increase to 10mg  given significant tone  -PRAFO RLE  -ROM with therapy 4. Mood: Provide emotional support 5. Neuropsych: This patient iscapable  of making decisions on hisown behalf. 6. Skin/Wound Care: Routine skin checks 7. Fluids/Electrolytes/Nutrition:   8.CAD with CABG.No chest pain or shortness of breath 9. Hypertension. Lopressor 25 mg daily  10.COPD/tobacco abuse. Counseling.Continue inhalers 11. Hyponatremia:  -likely central  -follow serially for trend  -no changes to diet/fluids/meds today   LOS (Days) 1 A FACE TO FACE EVALUATION WAS PERFORMED  Ranelle Oyster, MD 09/10/2017 9:16 AM

## 2017-09-10 NOTE — Progress Notes (Signed)
Orthopedic Tech Progress Note Patient Details:  Delora FuelRichard A Pikus 08-Aug-1946 [id number]  Ortho Devices Type of Ortho Device:  (prafo boot) Ortho Device/Splint Location: rle Ortho Device/Splint Interventions: Application   Lizzy Hamre 09/10/2017, 11:57 AM

## 2017-09-10 NOTE — Progress Notes (Signed)
*  PRELIMINARY RESULTS* Vascular Ultrasound Bilateral lower extremity venous duplex has been completed.  Preliminary findings: No evidence of deep vein thrombosis or baker's cysts bilaterally.   Chauncey FischerCharlotte C Avyn Aden 09/10/2017, 1:02 PM

## 2017-09-11 ENCOUNTER — Inpatient Hospital Stay (HOSPITAL_COMMUNITY): Payer: Medicare HMO | Admitting: Physical Therapy

## 2017-09-11 ENCOUNTER — Inpatient Hospital Stay (HOSPITAL_COMMUNITY): Payer: Medicare HMO | Admitting: Occupational Therapy

## 2017-09-11 ENCOUNTER — Inpatient Hospital Stay (HOSPITAL_COMMUNITY): Payer: Medicare HMO | Admitting: Speech Pathology

## 2017-09-11 DIAGNOSIS — I619 Nontraumatic intracerebral hemorrhage, unspecified: Secondary | ICD-10-CM

## 2017-09-11 DIAGNOSIS — E871 Hypo-osmolality and hyponatremia: Secondary | ICD-10-CM

## 2017-09-11 DIAGNOSIS — I10 Essential (primary) hypertension: Secondary | ICD-10-CM

## 2017-09-11 LAB — BASIC METABOLIC PANEL
ANION GAP: 11 (ref 5–15)
BUN: 18 mg/dL (ref 6–20)
CHLORIDE: 92 mmol/L — AB (ref 101–111)
CO2: 27 mmol/L (ref 22–32)
Calcium: 8.8 mg/dL — ABNORMAL LOW (ref 8.9–10.3)
Creatinine, Ser: 0.85 mg/dL (ref 0.61–1.24)
GFR calc Af Amer: >60 mL/min (ref >60)
Glucose, Bld: 111 mg/dL — ABNORMAL HIGH (ref 65–99)
POTASSIUM: 4.1 mmol/L (ref 3.5–5.1)
Sodium: 130 mmol/L — ABNORMAL LOW (ref 135–145)

## 2017-09-11 NOTE — Progress Notes (Signed)
Physical Therapy Session Note  Patient Details  Name: Jaime Pierce MRN: [id number] Date of Birth: 1946-02-07  Today's Date: 09/11/2017 PT Individual Time:  -      Short Term Goals: Week 1:  PT Short Term Goal 1 (Week 1): pt will be able to perform basic transfers with max assist of 1 PT Short Term Goal 2 (Week 1): pt will be able to complete sit <> stand with max assist of 1 PT Short Term Goal 3 (Week 1): pt will be able to propel w/c x 100' with min assist  Skilled Therapeutic Interventions/Progress Updates:   Pt in supine upon arrival and agreeable to therapy, no c/o pain. Worked on functional activity this session. Pt transferred to EOB w/ Mod-Max A and maintained static sitting balance for 10+ min while maintaining upright and midline orientation w/o back support. Pt maintained static sitting balance w/ Min guard to supervision without lean or LOB. Transferred to w/c w/ Max A x2. Performed multiple sit<>stands from w/c in standing frame w/ straps and performed 1 sit>stand w/o straps (Max A x2 helpers). Pt able to maintain standing in standing frame w/ supervision to Min guard w/ verbal cues for posture and bilateral UE support. While maintaining standing, encouraged increased trunk extension w/ visual cues and encouraged reaching in all directions, emphasis on crossing midline, w/ LUE. Returned to room and transferred to recliner Max A x2. Educated pt, wife, and daughter on importance of spending time sitting up in recliner during the day outside of therapies to encourage attention to environment, tolerance to OOB and upright activity, and promote WB in BLEs. All verbalized understanding and in agreement. Ended session in recliner and in care of family, all needs met.   Therapy Documentation Precautions:  Precautions Precautions: Fall Precaution Comments: right sided weakness, right inattention Restrictions Weight Bearing Restrictions: No Vital Signs: Oxygen Therapy SpO2: 93  % O2 Device: Not Delivered  See Function Navigator for Current Functional Status.   Therapy/Group: Individual Therapy  Kathie Posa K Arnette 09/11/2017, 12:50 PM

## 2017-09-11 NOTE — Progress Notes (Signed)
Occupational Therapy Session Note  Patient Details  Name: Jaime Pierce MRN: [id number] Date of Birth: 04/20/1946  Today's Date: 09/11/2017 OT Individual Time: 1005-1105 and 1330-1400 OT Individual Time Calculation (min): 60 min and 30 min   Short Term Goals: Week 1:  OT Short Term Goal 1 (Week 1): Pt will transfer to BSC/toilet with MAX A 1 caregiver to decrease burden of care OT Short Term Goal 2 (Week 1): Pt will recall hemi dressing techniques with min question cues OT Short Term Goal 3 (Week 1): Pt will locate 4/4 items in R visual field with no more  than 1 VC OT Short Term Goal 4 (Week 1): pt will sit to stand with MAX A of 1 caregiver in prep for clothing management  Skilled Therapeutic Interventions/Progress Updates:    Tx focus on sit<stands, functional transfers, Rt attention, and ADL retraining.   Pt greeted in w/c with NT and RN present. Had just toileted. Requesting shower today. With 2 helpers, pt transferring to roll in shower chair with use of Stedy. Pt with heavy Rt lean and required visual cues to improve midline orientation. Pt requiring overall Mod A while showering. Max cues for sequencing and attending to Rt side. R UE HOH for washing Lt side. Pt with trace amounts of blood on wash cloth during pericare. RN made aware and applied buttocks ointment afterwards. Dressing completed w/c level sit<stand with Stedy and 2 helpers. Worked on Rt/Lt discrimination with instructing pt to raise specific UE/LE during dressing. Discussed with pt/spouse hemi techniques. Continued education provided regarding midline orientation in standing (spouse present and was very helpful with cueing). At end of tx pt was returned to bed, repositioned for comfort, and left with all needs within reach.   2nd Session 1:1 tx (30 min) Tx focus on Rt NMR and family education during meaningful activity participation.   Pt greeted in bed, via SLP handoff. Daughter also present. Repositioned him in  bed to achieve neutral alignment (pt presented with Lt lean). Worked on Rt/Lt discrimination, Rt attention, and Rt ROM with pt completing AAROM in beat to music (trying his best to). Active assist required also from OT. Passive stretching completed in elbow extension for contracture mgt. Educated daughter on passive stretching/movement of affected limb (in pain free regions, below 90 degrees) outside of therapies. Pt requiring step by step cuing to consistently follow 1 step instructions during tx. He recalled familiar songs/artists with 25% accuracy. Pt left with daughter at time of departure.   Therapy Documentation Precautions:  Precautions Precautions: Fall Precaution Comments: right sided weakness, right inattention Restrictions Weight Bearing Restrictions: No Vital Signs: Oxygen Therapy SpO2: 93 % O2 Device: Not Delivered Pain: No c/o pain during tx    ADL:     See Function Navigator for Current Functional Status.   Therapy/Group: Individual Therapy  Louine Tenpenny A Esthela Brandner 09/11/2017, 12:46 PM

## 2017-09-11 NOTE — Progress Notes (Signed)
Speech Language Pathology Daily Session Note  Patient Details  Name: Jaime FuelRichard A Jessee MRN: [id number] Date of Birth: [birth year]  Today's Date: 09/11/2017 SLP Individual Time: 1300-1325 SLP Individual Time Calculation (min): 25 min  Short Term Goals: Week 1: SLP Short Term Goal 1 (Week 1): Patient will demonstrate sustained attention to functional tasks for ~30 minutes with Min A verbal cues for redirection.  SLP Short Term Goal 2 (Week 1): Patient will self-monitor and correct verbal errors at the phrase level with Min A verbal cues.  SLP Short Term Goal 3 (Week 1): Patient will follow 2 step directions with 25% accuracy and Max A verbal cues.  SLP Short Term Goal 4 (Week 1): Patient will demonstrate reading comprehension at the sentence level with Min A verbal cues.  SLP Short Term Goal 5 (Week 1): Patient will demonstrate functional problem solving for basic and familiar tasks with Mod A verbal cues.  SLP Short Term Goal 6 (Week 1): Patient will utilize word-finding strategies at the phrase level with Max A multimodal cues.   Skilled Therapeutic Interventions:  Pt was seen for skilled ST targeting communication goals.  SLP facilitated the session with a basic, picture description task to address word finding at the phrase level.  Pt needed overall mod-max assist verbal cues for expansion of utterances and to name objects in pictures. Pt verbalized frustration with task difficulty and therapist offered support and explanation of rationale of ST interventions.  Daughter present who also provided support.  Pt was left in bed with daughter at bedside and handed off to OT.  Continue per current plan of care.       Function:  Eating Eating   Modified Consistency Diet: No Eating Assist Level: Set up assist for;More than reasonable amount of time   Eating Set Up Assist For: Opening containers       Cognition Comprehension Comprehension assist level: Understands basic 75 - 89% of the time/  requires cueing 10 - 24% of the time  Expression   Expression assist level: Expresses basic 50 - 74% of the time/requires cueing 25 - 49% of the time. Needs to repeat parts of sentences.  Social Interaction Social Interaction assist level: Interacts appropriately 50 - 74% of the time - May be physically or verbally inappropriate.  Problem Solving Problem solving assist level: Solves basic 25 - 49% of the time - needs direction more than half the time to initiate, plan or complete simple activities  Memory Memory assist level: Recognizes or recalls 25 - 49% of the time/requires cueing 50 - 75% of the time    Pain Pain Assessment Pain Assessment: No/denies pain  Therapy/Group: Individual Therapy  Elivia Robotham, Melanee SpryNicole L 09/11/2017, 1:33 PM

## 2017-09-11 NOTE — Progress Notes (Signed)
[facility] MEDICINE & REHABILITATION     PROGRESS NOTE    Subjective/Complaints: Patient seen sitting up in bed this morning. He slept well overnight. Family at bedside. He states he had a good first in therapies yesterday.  ROS: Denies nausea, vomiting, diarrhea, shortness of breath or chest pain   Objective: Vital Signs: Blood pressure 125/67, pulse 85, temperature 98.1 F (36.7 C), temperature source Oral, resp. rate 18, height 6\' 1"  (1.854 m), weight 83.5 kg (184 lb), SpO2 90 %. No results found.  Recent Labs  09/09/17 1932  WBC 12.9*  HGB 14.9  HCT 41.9  PLT 279    Recent Labs  09/09/17 1932 09/11/17 0529  NA 127* 130*  K 3.7 4.1  CL 94* 92*  GLUCOSE 131* 111*  BUN 21* 18  CREATININE 0.88 0.85  CALCIUM 8.6* 8.8*   CBG (last 3)  No results for input(s): GLUCAP in the last 72 hours.  Wt Readings from Last 3 Encounters:  09/09/17 83.5 kg (184 lb)  09/01/17 85.1 kg (187 lb 9.8 oz)  06/30/17 83 kg (183 lb)    Physical Exam:   Constitutional: He appears well-developed. NAD. HENT: Normocephalic. Atraumatic. Eyes: EOMare normal. No discharge. Cardiovascular: RRR. No JVD  Respiratory: CTA Bilaterally. Normal effort  GI: Bowel sounds are normal. He exhibits no distension.  Skin.warm and dry. Intact. Neurological: He is alertand oriented.  He follows commands.  Apraxia, motor apraxia Motor: LUE/LLE: 5/5 proximal to distal  RUE: 0/5 in the deltoid, biceps, triceps, finger flexors and extensors Increased tone RUE/RLE  Assessment/Plan: 1. 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 Physiatrist is providing close team supervision and 24 hour management of active medical problems listed below. Physiatrist and rehab team continue to assess barriers to discharge/monitor patient progress toward functional and medical  goals.  Function:  Bathing Bathing position   Position: Shower  Bathing parts Body parts bathed by patient: Chest, Abdomen, Front perineal area, Right upper leg, Left upper leg Body parts bathed by helper: Right arm, Left arm, Buttocks, Right lower leg, Left lower leg, Back  Bathing assist Assist Level:  (Mod A)      Upper Body Dressing/Undressing Upper body dressing   What is the patient wearing?: Pull over shirt/dress     Pull over shirt/dress - Perfomed by patient: Put head through opening Pull over shirt/dress - Perfomed by helper: Thread/unthread right sleeve, Thread/unthread left sleeve, Pull shirt over trunk        Upper body assist        Lower Body Dressing/Undressing Lower body dressing   What is the patient wearing?: Pants, Non-skid slipper socks   Underwear - Performed by helper: Thread/unthread right underwear leg, Thread/unthread left underwear leg, Pull underwear up/down   Pants- Performed by helper: Thread/unthread right pants leg, Thread/unthread left pants leg, Pull pants up/down, Fasten/unfasten pants   Non-skid slipper socks- Performed by helper: Don/doff right sock, Don/doff left sock                  Lower body assist Assist for lower body dressing: 2 Helpers      Toileting Toileting Toileting activity did not occur: No continent bowel/bladder event   Toileting steps completed by helper: Adjust clothing prior to toileting, Performs perineal hygiene, Adjust clothing after toileting Toileting Assistive Devices: Grab bar or rail  Toileting assist     Transfers  Chair/bed transfer   Chair/bed transfer method: Squat pivot Chair/bed transfer assist level: Maximal assist (Pt 25 - 49%/lift and lower) Chair/bed transfer assistive device: Armrests     Locomotion Ambulation Ambulation activity did not occur: Safety/medical concerns         Wheelchair   Type: Manual Max wheelchair distance: 30' Assist Level: Moderate assistance (Pt 50 -  74%)  Cognition Comprehension Comprehension assist level: Understands basic 75 - 89% of the time/ requires cueing 10 - 24% of the time  Expression Expression assist level: Expresses basic 50 - 74% of the time/requires cueing 25 - 49% of the time. Needs to repeat parts of sentences.  Social Interaction Social Interaction assist level: Interacts appropriately 50 - 74% of the time - May be physically or verbally inappropriate.  Problem Solving Problem solving assist level: Solves basic 25 - 49% of the time - needs direction more than half the time to initiate, plan or complete simple activities  Memory Memory assist level: Recognizes or recalls 25 - 49% of the time/requires cueing 50 - 75% of the time   Medical Problem List and Plan: 1. 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 Images reviewed. 2. DVT Prophylaxis/Anticoagulation: SCDs. Monitor for any signs of DVT  -Dopplers negative for DVT 3. Pain Management/spasticity: change to BID dosing given mid-day fatigue. Increased to 10mg  given significant tone  -PRAFO RLE  -ROM with therapy 4. Mood: Provide emotional support 5. Neuropsych: This patient iscapable of making decisions on hisown behalf. 6. Skin/Wound Care: Routine skin checks 7. Fluids/Electrolytes/Nutrition:   8.CAD with CABG.No chest pain or shortness of breath 9. Hypertension. Lopressor 25 mg daily   Controlled on 10/20 10.COPD/tobacco abuse. Counseling.Continue inhalers 11. Hyponatremia:  -likely central  Sodium 130 on 10/20, stable  Continue to monitor   LOS (Days) 2 A FACE TO FACE EVALUATION WAS PERFORMED  Aarit Kashuba Karis Juba, MD 09/11/2017 5:49 PM

## 2017-09-11 NOTE — Progress Notes (Signed)
Patient insisted taking off SCD's at 0240.

## 2017-09-12 ENCOUNTER — Encounter (HOSPITAL_COMMUNITY): Payer: Self-pay

## 2017-09-12 NOTE — Progress Notes (Signed)
[facility] PHYSICAL MEDICINE & REHABILITATION     PROGRESS NOTE   Subjective/Complaints: Pt seen laying in bed this AM.  He slept well overnight.  His wife notes that he moved his RLE this AM.    ROS: Denies nausea, vomiting, diarrhea, shortness of breath or chest pain   Objective: Vital Signs: Blood pressure 136/73, pulse 71, temperature 97.9 F (36.6 C), temperature source Oral, resp. rate 14, height 6\' 1"  (1.854 m), weight 83.5 kg (184 lb), SpO2 93 %. No results found.  Recent Labs  09/09/17 1932  WBC 12.9*  HGB 14.9  HCT 41.9  PLT 279    Recent Labs  09/09/17 1932 09/11/17 0529  NA 127* 130*  K 3.7 4.1  CL 94* 92*  GLUCOSE 131* 111*  BUN 21* 18  CREATININE 0.88 0.85  CALCIUM 8.6* 8.8*   CBG (last 3)  No results for input(s): GLUCAP in the last 72 hours.  Wt Readings from Last 3 Encounters:  09/09/17 83.5 kg (184 lb)  09/01/17 85.1 kg (187 lb 9.8 oz)  06/30/17 83 kg (183 lb)    Physical Exam:   Constitutional: He appears well-developed. NAD. HENT: Normocephalic. Atraumatic. Eyes: EOMare normal. No discharge. Cardiovascular: RRR. No JVD  Respiratory: CTA Bilaterally. Normal effort  GI: Bowel sounds are normal. He exhibits no distension.  Skin.warm and dry. Intact. Neurological: He is alertand oriented.  He follows commands.  Apraxia, motor apraxia Motor: LUE/LLE: 5/5 proximal to distal  RUE/RLE: 0/5 proximal to distal.  Increased tone RUE/RLE  Assessment/Plan: 1. 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 Physiatrist is providing close team supervision and 24 hour management of active medical problems listed below. Physiatrist and rehab team continue to assess barriers to discharge/monitor patient progress toward functional and medical goals.  Function:  Bathing Bathing position   Position: Shower   Bathing parts Body parts bathed by patient: Chest, Abdomen, Front perineal area, Right upper leg, Left upper leg Body parts bathed by helper: Right arm, Left arm, Buttocks, Right lower leg, Left lower leg, Back  Bathing assist Assist Level:  (Mod A)      Upper Body Dressing/Undressing Upper body dressing   What is the patient wearing?: Pull over shirt/dress     Pull over shirt/dress - Perfomed by patient: Put head through opening Pull over shirt/dress - Perfomed by helper: Thread/unthread right sleeve, Thread/unthread left sleeve, Pull shirt over trunk        Upper body assist        Lower Body Dressing/Undressing Lower body dressing   What is the patient wearing?: Pants, Non-skid slipper socks   Underwear - Performed by helper: Thread/unthread right underwear leg, Thread/unthread left underwear leg, Pull underwear up/down   Pants- Performed by helper: Thread/unthread right pants leg, Thread/unthread left pants leg, Pull pants up/down, Fasten/unfasten pants   Non-skid slipper socks- Performed by helper: Don/doff right sock, Don/doff left sock                  Lower body assist Assist for lower body dressing: 2 Helpers      Toileting Toileting Toileting activity did not occur: No continent bowel/bladder event   Toileting steps completed by helper: Adjust clothing prior to toileting, Performs perineal hygiene, Adjust clothing after toileting Toileting Assistive Devices: Grab bar or rail  Toileting assist     Transfers Chair/bed transfer   Chair/bed transfer  method: Squat pivot Chair/bed transfer assist level: 2 helpers Chair/bed transfer assistive device: Armrests     Locomotion Ambulation Ambulation activity did not occur: Safety/medical concerns         Wheelchair   Type: Manual Max wheelchair distance: 30' Assist Level: Moderate assistance (Pt 50 - 74%)  Cognition Comprehension Comprehension assist level: Understands basic 50 - 74% of the time/  requires cueing 25 - 49% of the time  Expression Expression assist level: Expresses basic 50 - 74% of the time/requires cueing 25 - 49% of the time. Needs to repeat parts of sentences.  Social Interaction Social Interaction assist level: Interacts appropriately 50 - 74% of the time - May be physically or verbally inappropriate.  Problem Solving Problem solving assist level: Solves basic 25 - 49% of the time - needs direction more than half the time to initiate, plan or complete simple activities  Memory Memory assist level: Recognizes or recalls 25 - 49% of the time/requires cueing 50 - 75% of the time   Medical Problem List and Plan: 1. 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 DVT Prophylaxis/Anticoagulation: SCDs. Monitor for any signs of DVT  -Dopplers negative for DVT 3. Pain Management/spasticity: change to BID dosing given mid-day fatigue. Increased to 10mg  given significant tone  -PRAFO RLE  -ROM with therapy 4. Mood: Provide emotional support 5. Neuropsych: This patient iscapable of making decisions on hisown behalf. 6. Skin/Wound Care: Routine skin checks 7. Fluids/Electrolytes/Nutrition:   8.CAD with CABG.No chest pain or shortness of breath 9. Hypertension. Lopressor 25 mg daily   Controlled on 10/21 10.COPD/tobacco abuse. Counseling.Continue inhalers 11. Hyponatremia:  -likely central  Sodium 130 on 10/20, stable  Serial monitoring  Continue to monitor   LOS (Days) 3 A FACE TO FACE EVALUATION WAS PERFORMED  Ornella Coderre Karis Juba, MD 09/12/2017 2:10 PM

## 2017-09-13 ENCOUNTER — Inpatient Hospital Stay (HOSPITAL_COMMUNITY): Payer: Medicare HMO

## 2017-09-13 ENCOUNTER — Inpatient Hospital Stay (HOSPITAL_COMMUNITY): Payer: Medicare HMO | Admitting: Occupational Therapy

## 2017-09-13 MED ORDER — SENNOSIDES-DOCUSATE SODIUM 8.6-50 MG PO TABS: 1.0000 | ORAL_TABLET | Freq: Every evening | ORAL | Status: DC | PRN

## 2017-09-13 MED ORDER — NYSTATIN 100000 UNIT/GM EX POWD
Freq: Two times a day (BID) | CUTANEOUS | Status: DC
  Administered 2017-09-13 – 2017-09-18 (×10): via TOPICAL
  Filled 2017-09-13 (×2): qty 15

## 2017-09-13 MED ORDER — FLUCONAZOLE 100 MG PO TABS
100.0000 mg | ORAL_TABLET | Freq: Once | ORAL | Status: AC
  Administered 2017-09-13: 100 mg via ORAL
  Filled 2017-09-13: qty 1

## 2017-09-13 NOTE — Progress Notes (Signed)
Occupational Therapy Session Note  Patient Details  Name: Jaime Pierce MRN: [id number] Date of Birth: 08-24-1946  Today's Date: 09/13/2017 OT Individual Time: [id number] and 1345-1430 OT Individual Time Calculation (min): 74 min and 45 min    Short Term Goals: Week 1:  OT Short Term Goal 1 (Week 1): Pt will transfer to BSC/toilet with MAX A 1 caregiver to decrease burden of care OT Short Term Goal 2 (Week 1): Pt will recall hemi dressing techniques with min question cues OT Short Term Goal 3 (Week 1): Pt will locate 4/4 items in R visual field with no more  than 1 VC OT Short Term Goal 4 (Week 1): pt will sit to stand with MAX A of 1 caregiver in prep for clothing management  Skilled Therapeutic Interventions/Progress Updates:   Session 1: Upon entering the room, pt supine in bed with wife present in room. Pt with no c/o pain this session. Pt performed supine >sit with max A to EOB. Pt sitting with min A sitting balance. STEDY utilized to transfer pt onto roll in shower chair with +2 assistance for lifting as pt exhibits stronger pushing to R side. Pt bathing with hand over hand assistance to utilize R UE in functional task. Pt required max multimodal cues for initiation and sequencing of washing self. Pt incontinent of bowel and bladder while in shower and unaware. LB clothing management from shower chair while standing into STEDY with +2 assistance for safety. Pt returned to wheelchair with focus on hemiplegic UB dressing technique. Pt brushing teeth with set up A and increased time. Pt seated in wheelchair with quick release belt donned and call bell within reach.   Session 2: Upon entering the room, pt seated in wheelchair with family present in the room. Pt able to introduce therapist to his wife and one daughter but unable to name second daughter without choice of two for answer. [facility] tasks with decreased limitations. Therapist turning lights  off to help pt concentrate on board and only rings 1-3 engaged with pt sitting in wheelchair. Pt performed dynavision task x 2 minutes with sustained attention and pt reaching in all planes with L UE. Pt hitting 43 targets with reaction time average of 2.79 seconds. Pt's slowest time was in R inferior quadrant at 2.86 seconds.  OT performed PROM to R UE for digits,wrist, and elbow. R UE lap tray also added to wheelchair for support. Pt returned to wheelchair with call bell and all needed items within reach upon exiting the room.   Therapy Documentation Precautions:  Precautions Precautions: Fall Precaution Comments: right sided weakness, right inattention Restrictions Weight Bearing Restrictions: No General:   Vital Signs: Therapy Vitals Temp: 98.8 F (37.1 C) Temp Source: Axillary Pulse Rate: 72 Resp: 18 BP: 111/64 Patient Position (if appropriate): Lying Oxygen Therapy SpO2: 95 % O2 Device: Nasal Cannula  See Function Navigator for Current Functional Status.   Therapy/Group: Individual Therapy  Alen BleacherBradsher, Andrell Bergeson P 09/13/2017, 9:32 AM

## 2017-09-13 NOTE — Care Management Note (Signed)
[facility] Individual Statement of Services  Patient Name:  Jaime Pierce  Date:  09/13/2017  Welcome to the [facility]patient [facility].  Our goal is to provide you with an individualized program based on your diagnosis and situation, designed to meet your specific needs.  With this comprehensive rehabilitation program, you will be expected to participate in at least 3 hours of rehabilitation therapies Monday-Friday, with modified therapy programming on the weekends.  Your rehabilitation program will include the following services:  Physical Therapy (PT), Occupational Therapy (OT), Speech Therapy (ST), 24 hour per day rehabilitation nursing, Therapeutic Recreaction (TR), Neuropsychology, Case Management (Social Worker), Rehabilitation Medicine, Nutrition Services and Pharmacy Services  Weekly team conferences will be held on Tuesdays to discuss your progress.  Your Social Worker will talk with you frequently to get your input and to update you on team discussions.  Team conferences with you and your family in attendance may also be held.  Expected length of stay: 3-4 weeks  Overall anticipated outcome: minimal assistance  Depending on your progress and recovery, your program may change. Your Social Worker will coordinate services and will keep you informed of any changes. Your Social Worker's name and contact numbers are listed  below.  The following services may also be recommended but are not provided by the Inpatient [facility]:   Driving Evaluations  Home Health Rehabiltiation Services  Outpatient Rehabilitation Services    Arrangements will be made to provide these services after discharge if needed.  Arrangements include referral to agencies that provide these services.  Your insurance has been verified to be:  [employer] Your primary doctor is:  [facility]  Pertinent information will be shared with your doctor and your insurance  company.  Social Worker:  [place] [place] Dashawn [place], [place] [phone] or ([phone]) [phone]   Information discussed with and copy given to patient by: Amada JupiterHOYLE, Derenda Giddings, 09/13/2017, 4:13 PM

## 2017-09-13 NOTE — Progress Notes (Signed)
[place] PHYSICAL MEDICINE & REHABILITATION     PROGRESS NOTE   Subjective/Complaints: Slept well. A little frustrated by his perceived lack of progress. Eating well  ROS: pt denies nausea, vomiting, diarrhea, cough, shortness of breath or chest pain   Objective: Vital Signs: Blood pressure 111/64, pulse 72, temperature 98.8 F (37.1 C), temperature source Axillary, resp. rate 18, height 6\' 1"  (1.854 m), weight 83.5 kg (184 lb), SpO2 92 %. No results found. No results for input(s): WBC, HGB, HCT, PLT in the last 72 hours.  Recent Labs  09/11/17 0529  NA 130*  K 4.1  CL 92*  GLUCOSE 111*  BUN 18  CREATININE 0.85  CALCIUM 8.8*   CBG (last 3)  No results for input(s): GLUCAP in the last 72 hours.  Wt Readings from Last 3 Encounters:  09/09/17 83.5 kg (184 lb)  09/01/17 85.1 kg (187 lb 9.8 oz)  06/30/17 83 kg (183 lb)    Physical Exam:   Constitutional: He appears well-developed. NAD. HENT: Normocephalic. Atraumatic. Eyes: EOMare normal. No discharge. Cardiovascular: RRR without murmur. No JVD  Respiratory: CTA Bilaterally without wheezes or rales. Normal effort   GI: Bowel sounds are normal. He exhibits no distension.  Skin.warm and dry. Intact. Neurological: He is alertand oriented.  He follows commands.  Continued apraxia Motor: LUE/LLE: 5/5 proximal to distal  RUE/RLE: 0/5 proximal to distal. 2/4, fluctuating tone RUE/RLE  Assessment/Plan: 1. 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 Physiatrist is providing close team supervision and 24 hour management of active medical problems listed below. Physiatrist and rehab team continue to assess barriers to discharge/monitor patient progress toward functional and medical goals.  Function:  Bathing Bathing position   Position: Shower  Bathing parts Body parts bathed by  patient: Chest, Abdomen, Front perineal area, Right upper leg, Left upper leg Body parts bathed by helper: Right arm, Left arm, Buttocks, Right lower leg, Left lower leg, Back  Bathing assist Assist Level:  (mod A)      Upper Body Dressing/Undressing Upper body dressing   What is the patient wearing?: Pull over shirt/dress     Pull over shirt/dress - Perfomed by patient: Put head through opening, Pull shirt over trunk Pull over shirt/dress - Perfomed by helper: Thread/unthread right sleeve, Thread/unthread left sleeve        Upper body assist Assist Level:  (mod A)      Lower Body Dressing/Undressing Lower body dressing   What is the patient wearing?: Pants, Non-skid slipper socks   Underwear - Performed by helper: Thread/unthread right underwear leg, Thread/unthread left underwear leg, Pull underwear up/down   Pants- Performed by helper: Thread/unthread right pants leg, Thread/unthread left pants leg, Pull pants up/down, Fasten/unfasten pants   Non-skid slipper socks- Performed by helper: Don/doff right sock, Don/doff left sock                  Lower body assist Assist for lower body dressing: 2 Helpers      Toileting Toileting Toileting activity did not occur: Safety/medical concerns   Toileting steps completed by helper: Adjust clothing prior to toileting, Performs perineal hygiene, Adjust clothing after toileting Toileting Assistive Devices: Grab bar or rail  Toileting assist     Transfers Chair/bed transfer   Chair/bed transfer method: Squat pivot Chair/bed transfer assist level: 2 helpers Chair/bed transfer assistive device: Other (STEDY)  Locomotion Ambulation Ambulation activity did not occur: Safety/medical concerns         Wheelchair   Type: Manual Max wheelchair distance: 30' Assist Level: Moderate assistance (Pt 50 - 74%)  Cognition Comprehension Comprehension assist level: Understands basic 50 - 74% of the time/ requires cueing 25 - 49%  of the time  Expression Expression assist level: Expresses basic 50 - 74% of the time/requires cueing 25 - 49% of the time. Needs to repeat parts of sentences.  Social Interaction Social Interaction assist level: Interacts appropriately 50 - 74% of the time - May be physically or verbally inappropriate.  Problem Solving Problem solving assist level: Solves basic 25 - 49% of the time - needs direction more than half the time to initiate, plan or complete simple activities  Memory Memory assist level: Recognizes or recalls 25 - 49% of the time/requires cueing 50 - 75% of the time   Medical Problem List and Plan: 1. 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 Making gradual gains. Family supportive 2. DVT Prophylaxis/Anticoagulation: SCDs. Monitor for any signs of DVT  -Dopplers negative for DVT 3. Pain Management/spasticity: continue 10mg  BID dosing. No mid day dose due to fatigue  -PRAFO RLE  -ROM with therapy, family 4. Mood: Provide emotional support 5. Neuropsych: This patient iscapable of making decisions on hisown behalf. 6. Skin/Wound Care: nystatin powder and diflucan po x 1 dose 7. Fluids/Electrolytes/Nutrition:   8.CAD with CABG.No chest pain or shortness of breath 9. Hypertension. Lopressor 25 mg daily   Controlled on 10/21 10.COPD/tobacco abuse. Counseling.Continue inhalers 11. Hyponatremia:  -likely central  Sodium 130 on 10/20---recheck this week.      Continue to monitor   LOS (Days) 4 A FACE TO FACE EVALUATION WAS PERFORMED  Ranelle Oyster, MD 09/13/2017 9:37 AM

## 2017-09-13 NOTE — Progress Notes (Signed)
Speech Language Pathology Daily Session Note  Patient Details  Name: Jaime FuelRichard A Blaize MRN: [id number] Date of Birth: 1946-09-14  Today's Date: 09/13/2017 SLP Individual Time: 1005-1047 SLP Individual Time Calculation (min): 42 min  Short Term Goals: Week 1: SLP Short Term Goal 1 (Week 1): Patient will demonstrate sustained attention to functional tasks for ~30 minutes with Min A verbal cues for redirection.  SLP Short Term Goal 2 (Week 1): Patient will self-monitor and correct verbal errors at the phrase level with Min A verbal cues.  SLP Short Term Goal 3 (Week 1): Patient will follow 2 step directions with 25% accuracy and Max A verbal cues.  SLP Short Term Goal 4 (Week 1): Patient will demonstrate reading comprehension at the sentence level with Min A verbal cues.  SLP Short Term Goal 5 (Week 1): Patient will demonstrate functional problem solving for basic and familiar tasks with Mod A verbal cues.  SLP Short Term Goal 6 (Week 1): Patient will utilize word-finding strategies at the phrase level with Max A multimodal cues.   Skilled Therapeutic Interventions: Skilled ST services focused on cognitive skills. SLP facilitated comprehension of 2 step direction with items from LARK, pt demonstrated 60% accuracy with Max A verbal cues. SLP facilitated reading comprehension at phrase and sentence level with Max A visual cues to follow along with finger in order to redirect pt to read one word at a time. SLP provided family education on cues to aid in following 2 step directions during functional daily tasks, daughter returned verbal explanation. Pt was left with daughter in room. Recommend to continue ST services.     Function:  Eating Eating                 Cognition Comprehension Comprehension assist level: Understands basic 50 - 74% of the time/ requires cueing 25 - 49% of the time  Expression   Expression assist level: Expresses basic 50 - 74% of the time/requires cueing 25 - 49%  of the time. Needs to repeat parts of sentences.  Social Interaction Social Interaction assist level: Interacts appropriately 50 - 74% of the time - May be physically or verbally inappropriate.  Problem Solving Problem solving assist level: Solves basic 25 - 49% of the time - needs direction more than half the time to initiate, plan or complete simple activities  Memory Memory assist level: Recognizes or recalls 25 - 49% of the time/requires cueing 50 - 75% of the time    Pain Pain Assessment Pain Assessment: No/denies pain  Therapy/Group: Individual Therapy  Margeaux Swantek  [facility] 09/13/2017, 12:37 PM

## 2017-09-13 NOTE — Progress Notes (Signed)
Social Work Patient ID: Delora Fuelichard A Omalley, male   DOB: 03/12/1946, 71 y.o.   MRN: [id number]   Per therapy evaluations, team anticipating LOS 3-4 weeks with overall min assist w/c level and min/ mod assist ADLs and toileting.  Pt mostly total assist on evaluation by therapy.  Discharge plan for pt to return home with his wife as primary caregiver and able to provide 24/7 assistance. [place].  Local daughter can provide some additional support.  Continue to follow.  Treatment team conference tomorrow afternoon.  Kacy Hegna, LCSW

## 2017-09-13 NOTE — Progress Notes (Signed)
Physical Therapy Note  Patient Details  Name: Jaime Pierce MRN: [id number] Date of Birth: 09/29/1946 Today's Date: 09/13/2017  1430-1530, 60min individual tx Pain: none per pt  W/c propulsion over level tile, using hemi method, frequent multimodal cues and assistance to use LLE for steering. Slide board transfers to L to mat, w/c and bed, with +2 for set up and movement. Pt is limited by apraxia and hypertonicity.   Seated neuro re-ed for midline orientation, R/L lateral leans, head/hips relationship for facilitating movement.  Therapeutic activity in sitting to re-educate trunk for shortening/lengthening and rotating; reaching slightly out of BOS to L.  Pt demonstrates severe R inattention; when asked to look R he consistently looks L.  Pt exhausted; pt left resting in bed in L side lying supported with pillows.  Call bell within reach and family present.    See function navigator for current status.  Jaime Pierce 09/13/2017, 12:51 PM

## 2017-09-14 ENCOUNTER — Inpatient Hospital Stay (HOSPITAL_COMMUNITY): Payer: Medicare HMO | Admitting: Occupational Therapy

## 2017-09-14 ENCOUNTER — Inpatient Hospital Stay (HOSPITAL_COMMUNITY): Payer: Medicare HMO

## 2017-09-14 ENCOUNTER — Inpatient Hospital Stay (HOSPITAL_COMMUNITY): Payer: Medicare HMO | Admitting: Speech Pathology

## 2017-09-14 ENCOUNTER — Inpatient Hospital Stay (HOSPITAL_COMMUNITY): Payer: Medicare HMO | Admitting: Physical Therapy

## 2017-09-14 MED ORDER — BACLOFEN 10 MG PO TABS
10.0000 mg | ORAL_TABLET | Freq: Three times a day (TID) | ORAL | Status: DC
  Administered 2017-09-14 – 2017-09-20 (×18): 10 mg via ORAL
  Filled 2017-09-14 (×19): qty 1

## 2017-09-14 NOTE — Progress Notes (Signed)
Physical Therapy Session Note  Patient Details  Name: Jaime Pierce MRN: [id number] Date of Birth: 05/17/1946  Today's Date: 09/14/2017 PT Individual Time: 0302-0415 PT Individual Time Calculation (min): 73 min   Short Term Goals: Week 1:  PT Short Term Goal 1 (Week 1): pt will be able to perform basic transfers with max assist of 1 PT Short Term Goal 2 (Week 1): pt will be able to complete sit <> stand with max assist of 1 PT Short Term Goal 3 (Week 1): pt will be able to propel w/c x 100' with min assist  Skilled Therapeutic Interventions/Progress Updates:   Pt in w/c upon arrival and agreeable to therapy, no c/o pain. Worked on functional mobility w/ w/c and NMR this session. Instructed pt in self-propelling w/c using LEs only to promote reciprocal movement pattern, however pt required many multimodal cues and Max A to propel. Only able to go ~25' at a time before needing a rest break. Max A x2 for transferring w/c to mat in therapy gym. Worked on sitting balance, posture and trunk control w/ UE activities emphasizing attending to and weight-shifting to R side. Took 1 supine rest break w/ Min A to transfer to supine and worked on stretching BLE hamstring musculature w/ rhythmic initiation PNF. Able to achieve full LE extension within a few minutes of relaxation. Transferred back to edge of mat and worked on sit>stands w/ Max A x2. Pt unable to achieve full standing due to unbalanced muscle activation. Transferred back to w/c w/ Max A x2 and worked on w/c mobility as detailed above using either BLEs or L hemi technique. Pt w/ increased independence w/ L hemi technique. Went to day room and perform kinetron in w/c at lowest setting to facilitate reciprocal movement pattern and reciprocal muscle activation. Pt required Max A to complete task. Returned to room via Total A in w/c and transferred back to EOB and then supine. Educated wife, daughter, and pt on importance of spending some time  throughout the day between therapies w/ LEs extension to prevent hamstring contractures. All in agreement and verbalized understanding. Ended session in supine, call bell within reach and all needs met.   Therapy Documentation Precautions:  Precautions Precautions: Fall Precaution Comments: right sided weakness, right inattention Restrictions Weight Bearing Restrictions: No Vital Signs: Therapy Vitals Temp: 98.4 F (36.9 C) Pulse Rate: 92 Resp: 18 BP: 135/74 Patient Position (if appropriate): Sitting Oxygen Therapy SpO2: 93 % O2 Device: Not Delivered  See Function Navigator for Current Functional Status.   Therapy/Group: Individual Therapy   K Arnette 09/14/2017, 4:32 PM  

## 2017-09-14 NOTE — Plan of Care (Signed)
Problem: RH BOWEL ELIMINATION Goal: RH STG MANAGE BOWEL WITH ASSISTANCE STG Manage Bowel with min Assistance.   Outcome: Not Progressing Incont total assist yesteday

## 2017-09-14 NOTE — Progress Notes (Signed)
Occupational Therapy Session Note  Patient Details  Name: Jaime FuelRichard A Vanwagner MRN: [id number] Date of Birth: [birth year]  Today's Date: 09/14/2017 OT Individual Time: 1100-1200 OT Individual Time Calculation (min): 60 min    Short Term Goals: Week 1:  OT Short Term Goal 1 (Week 1): Pt will transfer to BSC/toilet with MAX A 1 caregiver to decrease burden of care OT Short Term Goal 2 (Week 1): Pt will recall hemi dressing techniques with min question cues OT Short Term Goal 3 (Week 1): Pt will locate 4/4 items in R visual field with no more  than 1 VC OT Short Term Goal 4 (Week 1): pt will sit to stand with MAX A of 1 caregiver in prep for clothing management  Skilled Therapeutic Interventions/Progress Updates:    1:1 Self care retraining at EOB level to focus on functional mobility, functional use of right UE, attempts for sit to stand, following one step directions, with bathing and dressing.  Began with rolling in flat bed with focus on breaking up tone and weight bearing through right side. Pt able to get to EOB on left side with mod A. Pt able to sustained dynamic sitting balance during bathing and dressing with min A with cues for attention. Pt required max A for utilization and facilitation of right UE use- partially due to tone. Pt with difficulty with motor planning and body awareness making LB dressing challenging.  Attempted sit to stand at EOB but unable to come into fully erect position with total A +2.  Transitioned to using the SARA to achieve fully upright position after setup.  Pt transfer into the w/c with sara- encouraged nursing staff to use this instead of STEDY. Pt able perform shaving with some A and brushed hair/ washed face.  Therapy Documentation Precautions:  Precautions Precautions: Fall Precaution Comments: right sided weakness, right inattention Restrictions Weight Bearing Restrictions: No Pain:  no c/o pain in session   See Function Navigator for Current  Functional Status.   Therapy/Group: Individual Therapy  Roney MansSmith, Brandonn Capelli [facility] 09/14/2017, 3:37 PM

## 2017-09-14 NOTE — Progress Notes (Signed)
Occupational Therapy Session Note  Patient Details  Name: Jaime Pierce A Hummel MRN: [id number] Date of Birth: [birth year]  Today's Date: 09/14/2017 OT Individual Time: 1300-1328 OT Individual Time Calculation (min): 28 min    Short Term Goals: Week 1:  OT Short Term Goal 1 (Week 1): Pt will transfer to BSC/toilet with MAX A 1 caregiver to decrease burden of care OT Short Term Goal 2 (Week 1): Pt will recall hemi dressing techniques with min question cues OT Short Term Goal 3 (Week 1): Pt will locate 4/4 items in R visual field with no more  than 1 VC OT Short Term Goal 4 (Week 1): pt will sit to stand with MAX A of 1 caregiver in prep for clothing management  Skilled Therapeutic Interventions/Progress Updates:    1:1. Focus of session on sit to stand in stedy, weight shifting L, PROM of RUE, and sitting balance on stedy. Pt sit to stand in stedy from w/c/elevated stedy seated with pt lifting ~20% with yoga block between B feet. RLE tone impacting smoothness of sit to stand transitions with pt pushing R. Pt attempts to use urinal while seated on high stedy seat however pt unable to void. OT provides PROM to RUE to improve UE tone and decrase contracture risk.  Therapy Documentation Precautions:  Precautions Precautions: Fall Precaution Comments: right sided weakness, right inattention Restrictions Weight Bearing Restrictions: No  See Function Navigator for Current Functional Status.   Therapy/Group: Individual Therapy  Shon HaleStephanie M Deloma Spindle 09/14/2017, 7:58 AM

## 2017-09-14 NOTE — Progress Notes (Signed)
Speech Language Pathology Daily Session Note  Patient Details  Name: Jaime Pierce MRN: [id number] Date of Birth: Aug 29, 1946  Today's Date: 09/14/2017 SLP Individual Time: 1015-1100 SLP Individual Time Calculation (min): 45 min  Short Term Goals: Week 1: SLP Short Term Goal 1 (Week 1): Patient will demonstrate sustained attention to functional tasks for ~30 minutes with Min A verbal cues for redirection.  SLP Short Term Goal 2 (Week 1): Patient will self-monitor and correct verbal errors at the phrase level with Min A verbal cues.  SLP Short Term Goal 3 (Week 1): Patient will follow 2 step directions with 25% accuracy and Max A verbal cues.  SLP Short Term Goal 4 (Week 1): Patient will demonstrate reading comprehension at the sentence level with Min A verbal cues.  SLP Short Term Goal 5 (Week 1): Patient will demonstrate functional problem solving for basic and familiar tasks with Mod A verbal cues.  SLP Short Term Goal 6 (Week 1): Patient will utilize word-finding strategies at the phrase level with Max A multimodal cues.   Skilled Therapeutic Interventions: Skilled treatment session focused on cognitive-linguistic goals. SLP facilitated session by providing Min A verbal cues for word-finding during a sentence completion task and for decoding at the phrase and sentence level. However, patient demonstrated increased difficulty with phonemic paraphasias during a functional and informal conversation. Patient left supine in bed with all needs within reach. Continue with current plan of care.      Function:  Cognition Comprehension Comprehension assist level: Understands basic 50 - 74% of the time/ requires cueing 25 - 49% of the time  Expression   Expression assist level: Expresses basic 75 - 89% of the time/requires cueing 10 - 24% of the time. Needs helper to occlude trach/needs to repeat words.  Social Interaction Social Interaction assist level: Interacts appropriately 75 - 89% of the  time - Needs redirection for appropriate language or to initiate interaction.  Problem Solving Problem solving assist level: Solves basic 25 - 49% of the time - needs direction more than half the time to initiate, plan or complete simple activities  Memory      Pain No/Denies Pain   Therapy/Group: Individual Therapy  Ted Leonhart 09/14/2017, 3:37 PM

## 2017-09-14 NOTE — Progress Notes (Signed)
[facility] PHYSICAL MEDICINE & REHABILITATION     PROGRESS NOTE   Subjective/Complaints: No new complaints. Slept failry well. Denies pain  ROS: pt denies nausea, vomiting, diarrhea, cough, shortness of breath or chest pain   Objective: Vital Signs: Blood pressure 138/78, pulse 72, temperature (!) 97.4 F (36.3 C), temperature source Oral, resp. rate 18, height 6\' 1"  (1.854 m), weight 83.5 kg (184 lb), SpO2 94 %. No results found. No results for input(s): WBC, HGB, HCT, PLT in the last 72 hours. No results for input(s): NA, K, CL, GLUCOSE, BUN, CREATININE, CALCIUM in the last 72 hours.  Invalid input(s): CO CBG (last 3)  No results for input(s): GLUCAP in the last 72 hours.  Wt Readings from Last 3 Encounters:  09/09/17 83.5 kg (184 lb)  09/01/17 85.1 kg (187 lb 9.8 oz)  06/30/17 83 kg (183 lb)    Physical Exam:   Constitutional: He appears well-developed. NAD. HENT: Normocephalic. Atraumatic. Eyes: EOMare normal. No discharge. Cardiovascular: RRR without murmur. No JVD  Respiratory: CTA Bilaterally without wheezes or rales. Normal effort    GI: Bowel sounds are normal. He exhibits no distension.  Skin.warm and dry. Intact. Neurological: He is alertand oriented.  He follows commands.  Continued apraxia but speech more automatic and fluent today Motor: LUE/LLE: 5/5 proximal to distal  RUE/RLE: 0/5 proximal to distal, ?mild flexor synergy movement. 2/4, fluctuating tone RUE/RLE  Assessment/Plan: 1. 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 Physiatrist is providing close team supervision and 24 hour management of active medical problems listed below. Physiatrist and rehab team continue to assess barriers to discharge/monitor patient progress toward functional and medical goals.  Function:  Bathing Bathing position   Position:  Shower  Bathing parts Body parts bathed by patient: Chest, Abdomen, Front perineal area, Right upper leg, Left upper leg Body parts bathed by helper: Right arm, Left arm, Buttocks, Right lower leg, Left lower leg, Back  Bathing assist Assist Level:  (mod A)      Upper Body Dressing/Undressing Upper body dressing   What is the patient wearing?: Pull over shirt/dress     Pull over shirt/dress - Perfomed by patient: Put head through opening, Pull shirt over trunk Pull over shirt/dress - Perfomed by helper: Thread/unthread right sleeve, Thread/unthread left sleeve        Upper body assist Assist Level:  (mod A)      Lower Body Dressing/Undressing Lower body dressing   What is the patient wearing?: Pants, Non-skid slipper socks   Underwear - Performed by helper: Thread/unthread right underwear leg, Thread/unthread left underwear leg, Pull underwear up/down   Pants- Performed by helper: Thread/unthread right pants leg, Thread/unthread left pants leg, Pull pants up/down, Fasten/unfasten pants   Non-skid slipper socks- Performed by helper: Don/doff right sock, Don/doff left sock                  Lower body assist Assist for lower body dressing: 2 Helpers      Toileting Toileting Toileting activity did not occur: Safety/medical concerns   Toileting steps completed by helper: Adjust clothing prior to toileting, Performs perineal hygiene, Adjust clothing after toileting Toileting Assistive Devices: Grab bar or rail  Toileting assist     Transfers Chair/bed transfer   Chair/bed transfer method: Lateral scoot Chair/bed transfer assist level: 2 helpers Chair/bed transfer assistive device: Runner, broadcasting/film/video  Ambulation Ambulation activity did not occur: Safety/medical concerns         Wheelchair   Type: Manual Max wheelchair distance: 50 Assist Level: Moderate assistance (Pt 50 - 74%)  Cognition Comprehension Comprehension assist level: Understands basic 50  - 74% of the time/ requires cueing 25 - 49% of the time  Expression Expression assist level: Expresses basic 25 - 49% of the time/requires cueing 50 - 75% of the time. Uses single words/gestures.  Social Interaction Social Interaction assist level: Interacts appropriately 25 - 49% of time - Needs frequent redirection.  Problem Solving Problem solving assist level: Solves basic 25 - 49% of the time - needs direction more than half the time to initiate, plan or complete simple activities  Memory Memory assist level: Recognizes or recalls 25 - 49% of the time/requires cueing 50 - 75% of the time   Medical Problem List and Plan: 1. 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 Making gradual gains.   -team conference 2. DVT Prophylaxis/Anticoagulation: SCDs. Monitor for any signs of DVT  -Dopplers negative for DVT 3. Pain Management/spasticity: continue 10mg  BID dosing--will re-introduce mid day dose and observe for effect  -PRAFO RLE  -ROM with therapy, family 4. Mood: Provide emotional support 5. Neuropsych: This patient iscapable of making decisions on hisown behalf. 6. Skin/Wound Care: nystatin powder BID and diflucan po x 1 dose 7. Fluids/Electrolytes/Nutrition:   8.CAD with CABG.No chest pain or shortness of breath 9. Hypertension. Lopressor 25 mg daily   Controlled on 10/21 10.COPD/tobacco abuse. Counseling.Continue inhalers  -dc oxygen 11. Hyponatremia:  -likely central  Sodium 130 on 10/20---recheck Thursday.          LOS (Days) 5 A FACE TO FACE EVALUATION WAS PERFORMED  Ranelle OysterSWARTZ,ZACHARY T, MD 09/14/2017 8:46 AM

## 2017-09-15 ENCOUNTER — Inpatient Hospital Stay (HOSPITAL_COMMUNITY): Payer: Medicare HMO | Admitting: Physical Therapy

## 2017-09-15 ENCOUNTER — Inpatient Hospital Stay (HOSPITAL_COMMUNITY): Payer: Medicare HMO | Admitting: *Deleted

## 2017-09-15 ENCOUNTER — Inpatient Hospital Stay (HOSPITAL_COMMUNITY): Payer: Medicare HMO | Admitting: Occupational Therapy

## 2017-09-15 ENCOUNTER — Encounter (HOSPITAL_COMMUNITY): Payer: Medicare HMO | Admitting: Psychology

## 2017-09-15 ENCOUNTER — Inpatient Hospital Stay (HOSPITAL_COMMUNITY): Payer: Medicare HMO | Admitting: Speech Pathology

## 2017-09-15 NOTE — Progress Notes (Signed)
Speech Language Pathology Daily Session Note  Patient Details  Name: Jaime Pierce MRN: [id number] Date of Birth: 04/14/1946  Today's Date: 09/15/2017 SLP Individual Time: 1345-1430 SLP Individual Time Calculation (min): 45 min  Short Term Goals: Week 1: SLP Short Term Goal 1 (Week 1): Patient will demonstrate sustained attention to functional tasks for ~30 minutes with Min A verbal cues for redirection.  SLP Short Term Goal 2 (Week 1): Patient will self-monitor and correct verbal errors at the phrase level with Min A verbal cues.  SLP Short Term Goal 3 (Week 1): Patient will follow 2 step directions with 25% accuracy and Max A verbal cues.  SLP Short Term Goal 4 (Week 1): Patient will demonstrate reading comprehension at the sentence level with Min A verbal cues.  SLP Short Term Goal 5 (Week 1): Patient will demonstrate functional problem solving for basic and familiar tasks with Mod A verbal cues.  SLP Short Term Goal 6 (Week 1): Patient will utilize word-finding strategies at the phrase level with Max A multimodal cues.   Skilled Therapeutic Interventions: Skilled treatment session focused on cognitive-linguistic goals. SLP facilitated session by providing overall Max A verbal cues for problem solving while sequencing 4 step picture cards and Min A verbal cues while verbally describing the actions within the pictures. Patient lethargic throughout session which suspect impacted overall function. Patient left upright in bed with all needs within reach. Continue with current plan of care.      Function:  Cognition Comprehension Comprehension assist level: Understands basic 50 - 74% of the time/ requires cueing 25 - 49% of the time  Expression   Expression assist level: Expresses basic 75 - 89% of the time/requires cueing 10 - 24% of the time. Needs helper to occlude trach/needs to repeat words.  Social Interaction Social Interaction assist level: Interacts appropriately 75 - 89% of the  time - Needs redirection for appropriate language or to initiate interaction.  Problem Solving Problem solving assist level: Solves basic 25 - 49% of the time - needs direction more than half the time to initiate, plan or complete simple activities  Memory Memory assist level: Recognizes or recalls 25 - 49% of the time/requires cueing 50 - 75% of the time    Pain No/Denies Pain   Therapy/Group: Individual Therapy  Tangy Drozdowski 09/15/2017, 3:18 PM

## 2017-09-15 NOTE — Consult Note (Signed)
Neuropsychological Consultation   Patient:   Jaime Pierce   DOB:   [birth year]  MR Number:  [id number]  Location:  [facility] [id number] [place] [address] Dept: [phone] Loc: [phone]           Date of Service:   09/15/2017  Start Time:   11 AM End Time:   12 PM  Provider/Observer:  Arley Phenix, Psy.D.       Clinical Neuropsychologist       Billing Code/Service: [phone] 4 Units  Chief Complaint:    Jaime Pierce is a 71 year old right hand male with a history of CAD with CABG, hypertension and tobacco abuse. The patient presented on 09/01/2017 with acute onset of right-sided weakness and facial droop. CT/MRI showed a left parietal lobe intraparenchymal hematoma. There was small volume acute subarachnoid hemorrhage in the left parietal lobe as well is the right frontal lobe. The patient has had ongoing difficulties with cognitive difficulties and adjustment to ongoing difficulties.  Reason for Service:  Jaime Pierce was referred for neuropsychological consultation due to ongoing adjustment and difficulties from left parietal lobe and an anterior right frontal lobe hemorrhage. Below is the history of present illness for the current admission.  HPI: Jaime Pierce a 71 y.o.right handed malewith history of CAD with CABG, hypertension, tobacco abuse. Per chart review patient lives with spouse. Independent and active. Wife is also retired. One level home 2 steps to entry. Presented 09/01/2017 with acute onset of right sided weakness and facial droop. Blood pressure 170/90. CT/MRI showed a 4.2 x 2.5 x 3.1 cm left parietal lobe intraparenchymal hematoma. Small-volume acute subarachnoid hemorrhage was in the adjacent left parietal lobe as well as at the anterior right frontal lobe. CT angiogram head and neck with no evidence of aneurysm or vascular malformation. Follow-up  neurology services with conservative care. Echocardiogram with ejection fraction of 55% no wall motion abnormalities. Follow-up cranial CT scan 09/02/2017 showed a mild interval increase in size of left parietal lobe intraparenchymal hematoma measuring 4.7 x 5.4 x 4.9 cm. No other acute intracranial abnormality. Tolerating a regular consistency diet. Physical and occupationaltherapy evaluationscompleted with recommendations of physical medicine rehabilitation consult.Patient was admitted for a comprehensive rehabilitation program  Behavioral Observation: Jaime Pierce  presents as a 71 y.o.-year-old Right  Male who appeared his stated age. his dress was Appropriate and he was Well Groomed and his manners were Appropriate to the situation.  his participation was indicative of Appropriate behaviors.  There were physical disabilities noted.  he displayed an appropriate level of cooperation and motivation.     Interactions:    Active Appropriate  Attention:   abnormal and attention span appeared shorter than expected for age  Memory:   abnormal; remote memory intact, recent memory impaired  Visuo-spatial:  within normal limits  Speech (Volume):  normal  Speech:   slurred;   Thought Process:  Coherent and Relevant  Though Content:  WNL; not suicidal  Orientation:   person, place, time/date and situation  Judgment:   Fair  Planning:   Fair  Affect:    Anxious and Irritable  Mood:    Anxious  Insight:   Fair  Intelligence:   normal  Medical History:   Past Medical History:  Diagnosis Date  . Coronary artery disease   . Dyspnea on exertion   . Hyperlipidemia   . Hypertension   . Tobacco abuse  Family Med/Psych History:  Family History  Problem Relation Age of Onset  . Heart disease Father   . CVA Mother   . Heart disease Maternal Grandfather     Impression/DX:  Jaime Pierce a Jaime Pierce is a 71 year old right hand male with a history of CAD with CABG, hypertension  and tobacco abuse. The patient presented on 09/01/2017 with acute onset of right-sided weakness and facial droop. CT/MRI showed a left parietal lobe intraparenchymal hematoma. There was small volume acute subarachnoid hemorrhage in the left parietal lobe as well is the right frontal lobe. The patient has had ongoing difficulties with cognitive difficulties and adjustment to ongoing difficulties.           Electronically Signed   _______________________ Arley PhenixJohn Arhan Pierce, Psy.D.

## 2017-09-15 NOTE — Progress Notes (Signed)
PHYSICAL MEDICINE & REHABILITATION     PROGRESS NOTE   Subjective/Complaints: Reasonable night. Wife notes that LEFT leg is tight this morning. Right leg and arm seem looser today however.   ROS: Limited due to cognitive/behavioral   Objective: Vital Signs: Blood pressure 127/68, pulse 77, temperature (!) 97.4 F (36.3 C), temperature source Oral, resp. rate 18, height 6\' 1"  (1.854 m), weight 83.5 kg (184 lb), SpO2 95 %. No results found. No results for input(s): WBC, HGB, HCT, PLT in the last 72 hours. No results for input(s): NA, K, CL, GLUCOSE, BUN, CREATININE, CALCIUM in the last 72 hours.  Invalid input(s): CO CBG (last 3)  No results for input(s): GLUCAP in the last 72 hours.  Wt Readings from Last 3 Encounters:  09/09/17 83.5 kg (184 lb)  09/01/17 85.1 kg (187 lb 9.8 oz)  06/30/17 83 kg (183 lb)    Physical Exam:   Constitutional: He appears well-developed. NAD. HENT: Normocephalic. Atraumatic. Eyes: EOMare normal. No discharge. Cardiovascular: RRR without murmur. No JVD  Respiratory: CTA Bilaterally without wheezes or rales. Normal effort     GI: Bowel sounds are normal. He exhibits no distension.  Skin.warm and dry. Intact. Neurological: He is alertand oriented.  He follows commands.  Continued apraxia but speech continues to be more automatic and fluent today Motor: LUE/LLE: 5/5 proximal to distal  RUE/RLE: 0/5 proximal to distal, ?mild flexor synergy movement. 1-2/4, fluctuating tone RUE>RLE ?rigidity, cogwheeling of LLE this morning--somewhat inconsistent Assessment/Plan: 1. 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 Physiatrist is providing close team supervision and 24 hour management of active medical problems listed below. Physiatrist and rehab team continue to assess barriers to discharge/monitor patient  progress toward functional and medical goals.  Function:  Bathing Bathing position   Position: Shower  Bathing parts Body parts bathed by patient: Chest, Abdomen, Front perineal area, Right upper leg, Left upper leg Body parts bathed by helper: Right arm, Left arm, Buttocks, Right lower leg, Left lower leg, Back  Bathing assist Assist Level:  (mod A)      Upper Body Dressing/Undressing Upper body dressing   What is the patient wearing?: Pull over shirt/dress     Pull over shirt/dress - Perfomed by patient: Put head through opening, Pull shirt over trunk Pull over shirt/dress - Perfomed by helper: Thread/unthread right sleeve, Thread/unthread left sleeve        Upper body assist Assist Level:  (mod A)      Lower Body Dressing/Undressing Lower body dressing   What is the patient wearing?: Pants, Non-skid slipper socks   Underwear - Performed by helper: Thread/unthread right underwear leg, Thread/unthread left underwear leg, Pull underwear up/down   Pants- Performed by helper: Thread/unthread right pants leg, Thread/unthread left pants leg, Pull pants up/down, Fasten/unfasten pants   Non-skid slipper socks- Performed by helper: Don/doff right sock, Don/doff left sock                  Lower body assist Assist for lower body dressing: 2 Helpers      Toileting Toileting Toileting activity did not occur: Safety/medical concerns   Toileting steps completed by helper: Adjust clothing prior to toileting, Performs perineal hygiene, Adjust clothing after toileting Toileting Assistive Devices: Grab bar or rail  Toileting assist     Transfers Chair/bed transfer   Chair/bed transfer method: Squat pivot Chair/bed transfer assist  level: 2 helpers Chair/bed transfer assistive device: Designer, fashion/clothing Ambulation activity did not occur: Safety/medical concerns         Wheelchair   Type: Manual Max wheelchair distance: 25' Assist Level: Maximal  assistance (Pt 25 - 49%)  Cognition Comprehension Comprehension assist level: Understands basic 50 - 74% of the time/ requires cueing 25 - 49% of the time  Expression Expression assist level: Expresses basic 75 - 89% of the time/requires cueing 10 - 24% of the time. Needs helper to occlude trach/needs to repeat words.  Social Interaction Social Interaction assist level: Interacts appropriately 75 - 89% of the time - Needs redirection for appropriate language or to initiate interaction.  Problem Solving Problem solving assist level: Solves basic 25 - 49% of the time - needs direction more than half the time to initiate, plan or complete simple activities  Memory Memory assist level: Recognizes or recalls 25 - 49% of the time/requires cueing 50 - 75% of the time   Medical Problem List and Plan: 1. 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 Noticeable gains in language    2. DVT Prophylaxis/Anticoagulation: SCDs. Monitor for any signs of DVT  -Dopplers negative for DVT 3. 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   -PRAFO RLE  -ROM with therapy, family 4. Mood: Provide emotional support 5. Neuropsych: This patient iscapable of making decisions on hisown behalf. 6. Skin/Wound Care: nystatin powder BID and diflucan po x 1 dose---improving 7. Fluids/Electrolytes/Nutrition:   8.CAD with CABG.No chest pain or shortness of breath 9. Hypertension. Lopressor 25 mg daily   Controlled on 10/21 10.COPD/tobacco abuse. Counseling.Continue inhalers  -off oxygen 11. Hyponatremia:  -likely central  Sodium 130 on 10/20---recheck tomorrow.  12. Mild leukocytosis: recheck tomorrow         LOS (Days) 6 A FACE TO FACE EVALUATION WAS PERFORMED  Ranelle Oyster, MD 09/15/2017 8:57 AM

## 2017-09-15 NOTE — Progress Notes (Signed)
Occupational Therapy Session Note  Patient Details  Name: Jaime FuelRichard A Borin MRN: [id number] Date of Birth: [birth year]  Today's Date: 09/15/2017 OT Individual Time: 0930-1030 OT Individual Time Calculation (min): 60 min    Short Term Goals: Week 1:  OT Short Term Goal 1 (Week 1): Pt will transfer to BSC/toilet with MAX A 1 caregiver to decrease burden of care OT Short Term Goal 2 (Week 1): Pt will recall hemi dressing techniques with min question cues OT Short Term Goal 3 (Week 1): Pt will locate 4/4 items in R visual field with no more  than 1 VC OT Short Term Goal 4 (Week 1): pt will sit to stand with MAX A of 1 caregiver in prep for clothing management  Skilled Therapeutic Interventions/Progress Updates:    Pt seen this session to facilitate trunk control for improved balance, tone reduction activities and transfers.  Pt received in w/c.  Wife state he received a shower yesterday and was already dressed for the day. Pt taken to the gym and completed squat pivot transfer with total A of 2.  He is able to lean forward through his trunk but is unable to wt shift to elevate hips even with max A.  He is having great difficulty bearing wt through his LLE possibly due to apraxia.    Once on mat pt tolerated unsupported sitting with S for over 40 minutes as he worked on: -RUE wt bearing through palm with wt shifts L and R.  -R wt shift with actively using trunk to return to midline (limited head righting) -R arm on ball with passive flex/ext to relax arm to reduce tone -R arm on ball with larger wt shift to R with tactile cues for head righting -B hands on large ball with total A to stabilize R hand as he rolled ball forward over his knees to work on controlled forward flexion  Pt moved into supine with mod A: -educated with return demonstration to his wife on passive and active bent knee sways for trunk rotation, PROM of R scapula and R elbow extension.   -slow sustained stretches for R  internal rotators and elbow flexors -pt actively working on upper trunk rotation with reaching L hand across body to touch R hand and then back to supine  Pt needs multimodal cues to follow directions for physical exercises and cues to attend to R side.  Otherwise, he did have good participation.  He sat up with max A and total A of 2 to return to w/c with squat pivot. Educated pt to do basic self ROM with hands clasped.  His wife took him back to the room.  Therapy Documentation Precautions:  Precautions Precautions: Fall Precaution Comments: right sided weakness, right inattention Restrictions Weight Bearing Restrictions: No     Vital Signs: Therapy Vitals Temp: (!) 97.4 F (36.3 C) Temp Source: Oral Pulse Rate: 77 Resp: 18 BP: 127/68 Patient Position (if appropriate): Lying Oxygen Therapy SpO2: 95 % O2 Device: Not Delivered Pain: Pain Assessment Pain Assessment: No/denies pain Pain Score: 0-No pain ADL:     See Function Navigator for Current Functional Status.   Therapy/Group: Individual Therapy  SAGUIER,JULIA 09/15/2017, 8:51 AM

## 2017-09-15 NOTE — Progress Notes (Signed)
Physical Therapy Session Note  Patient Details  Name: Jaime Pierce MRN: [id number] Date of Birth: 1946-03-06  Today's Date: 09/15/2017 PT Individual Time: 0803-0900 PT Individual Time Calculation (min): 57 min   Short Term Goals: Week 1:  PT Short Term Goal 1 (Week 1): pt will be able to perform basic transfers with max assist of 1 PT Short Term Goal 2 (Week 1): pt will be able to complete sit <> stand with max assist of 1 PT Short Term Goal 3 (Week 1): pt will be able to propel w/c x 100' with min assist  Skilled Therapeutic Interventions/Progress Updates: Pt presented in bed with wife present agreeable to therapy. Pt performed rolling L/R with maxA to asssit wife in donning shorts. Performed supine to sit with use of features maxA with [facility] assist for placement on bed rails and verbal cues to facilitate LE placement. Pt required modA initially in sitting to decrease posterior lean. Pt transported via Clarise Cruz to w/c with cues while in standing for increasing upright posture. Pt transported to day room and placed in standing frame with standing tolerance up to 5 min. While in standing frame after 3 min PTA would slacken lift straps to increase pt's workload in standing. Pt initially with difficulty maintaining erect posture and unable to fully extend RLE however demonstrated some improvement during second trial. Pt transported to rehab gym and performed sit to stand in parallel bars, requiring maxA x2 and max multimodal cues. Pt able to maintain full standing positioning with cues and modA. Pt returned to room and remained in w/c with half lap tray in place and wife present with needs met.      Therapy Documentation Precautions:  Precautions Precautions: Fall Precaution Comments: right sided weakness, right inattention Restrictions Weight Bearing Restrictions: No General:   Vital Signs: Therapy Vitals Temp: 97.7 F (36.5 C) Temp Source: Oral Pulse Rate: 80 Resp: 20 BP: (!)  142/82 Patient Position (if appropriate): Lying Oxygen Therapy SpO2: 92 % O2 Device: Not Delivered   See Function Navigator for Current Functional Status.   Therapy/Group: Individual Therapy  Vlasta Baskin  Shaylinn Hladik, PTA  09/15/2017, 12:49 PM

## 2017-09-16 ENCOUNTER — Inpatient Hospital Stay (HOSPITAL_COMMUNITY): Payer: Medicare HMO | Admitting: Speech Pathology

## 2017-09-16 ENCOUNTER — Inpatient Hospital Stay (HOSPITAL_COMMUNITY): Payer: Medicare HMO | Admitting: Occupational Therapy

## 2017-09-16 ENCOUNTER — Inpatient Hospital Stay (HOSPITAL_COMMUNITY): Payer: Medicare HMO | Admitting: Physical Therapy

## 2017-09-16 DIAGNOSIS — B369 Superficial mycosis, unspecified: Secondary | ICD-10-CM

## 2017-09-16 LAB — BASIC METABOLIC PANEL
ANION GAP: 11 (ref 5–15)
BUN: 13 mg/dL (ref 6–20)
CALCIUM: 8.4 mg/dL — AB (ref 8.9–10.3)
CO2: 25 mmol/L (ref 22–32)
CREATININE: 0.73 mg/dL (ref 0.61–1.24)
Chloride: 96 mmol/L — ABNORMAL LOW (ref 101–111)
GFR calc non Af Amer: >60 mL/min (ref >60)
Glucose, Bld: 116 mg/dL — ABNORMAL HIGH (ref 65–99)
Potassium: 3.9 mmol/L (ref 3.5–5.1)
SODIUM: 132 mmol/L — AB (ref 135–145)

## 2017-09-16 LAB — CBC
HCT: 40.1 % (ref 39.0–52.0)
Hemoglobin: 13.6 g/dL (ref 13.0–17.0)
MCH: 31 pg (ref 26.0–34.0)
MCHC: 33.9 g/dL (ref 30.0–36.0)
MCV: 91.3 fL (ref 78.0–100.0)
PLATELETS: 381 10*3/uL (ref 150–400)
RBC: 4.39 MIL/uL (ref 4.22–5.81)
RDW: 13.3 % (ref 11.5–15.5)
WBC: 9.8 10*3/uL (ref 4.0–10.5)

## 2017-09-16 MED ORDER — FLUCONAZOLE 100 MG PO TABS
100.0000 mg | ORAL_TABLET | Freq: Every day | ORAL | Status: DC
  Administered 2017-09-16 – 2017-09-17 (×2): 100 mg via ORAL
  Filled 2017-09-16 (×2): qty 1

## 2017-09-16 NOTE — Progress Notes (Signed)
Nutrition Follow-up  DOCUMENTATION CODES:   Not applicable  INTERVENTION:  Continue Ensure Enlive po BID, each supplement provides 350 kcal and 20 grams of protein.  Encourage adequate PO intake.   NUTRITION DIAGNOSIS:   Inadequate oral intake related to poor appetite as evidenced by per patient/family report; improving  GOAL:   Patient will meet greater than or equal to 90% of their needs; met  MONITOR:   PO intake, Supplement acceptance, Labs, Weight trends, Skin, I & O's  REASON FOR ASSESSMENT:   Malnutrition Screening Tool    ASSESSMENT:   71 y.o.right handed male with history of CAD with CABG, hypertension, tobacco abuse. Presented 09/01/2017 with acute onset of right sided weakness and facial droop. CT/MRI showed a 4.2 x 2.5 x 3.1 cm left parietal lobe intraparenchymal hematoma. Small-volume acute subarachnoid hemorrhage was in the adjacent left parietal lobe as well as at the anterior right frontal lobe.   Meal completion has been varied from 25-90% with 75% at lunch today. Pt currently has Ensure ordered and has been consuming them. RD continue with current orders to aid in adequate nutrition. Labs and medications reviewed.   Diet Order:  Diet Heart Room service appropriate? Yes; Fluid consistency: Thin  Skin:  Reviewed, no issues  Last BM:  10/23  Height:   Ht Readings from Last 1 Encounters:  09/09/17 _0  (1.854 m)    Weight:   Wt Readings from Last 1 Encounters:  09/09/17 184 lb (83.5 kg)    Ideal Body Weight:  83.6 kg  BMI:  Body mass index is 24.28 kg/m.  Estimated Nutritional Needs:   Kcal:  2000-2200  Protein:  90-100 grams  Fluid:  2-2.2 L/day  EDUCATION NEEDS:   No education needs identified at this time  Corrin Parker, MS, RD, LDN Pager # [phone] After hours/ weekend pager # [phone]

## 2017-09-16 NOTE — Progress Notes (Signed)
Physical Therapy Session Note  Patient Details  Name: Jaime Pierce MRN: [id number] Date of Birth: 12-Mar-1946  Today's Date: 09/15/2017 PT Individual Time:1445-1515   30 min   Short Term Goals: Week 1:  PT Short Term Goal 1 (Week 1): pt will be able to perform basic transfers with max assist of 1 PT Short Term Goal 2 (Week 1): pt will be able to complete sit <> stand with max assist of 1 PT Short Term Goal 3 (Week 1): pt will be able to propel w/c x 100' with min assist  Skilled Therapeutic Interventions/Progress Updates:  Pt received supine in bed and agreeable to PT. Supine>sit transfer with mod-max assist from PT and max cues for sequencing and trunk control  Sqaut pivot transfer to the [facility] with max-total assist to the L. PT required to block R foot to prevent flexor withdrawal.   PT transported pt to rehab gym in [facility]. With transfer to mat table with the same technique as listed above. Sitting balance at [facility] with mod assist for reaching R and L with PT to stimulate the R side trunk and improve disassociation for lumbar and thoracic movements.   Pt returned to room and performed max + 2 assist for safety transfer to bed. Sit>supine completed with max assist, and pt left supine in bed with call bell in reach and all needs met.         Therapy Documentation Precautions:  Precautions Precautions: Fall Precaution Comments: right sided weakness, right inattention Restrictions Weight Bearing Restrictions: No    Vital Signs: Therapy Vitals Temp: 97.9 F (36.6 C) Temp Source: Oral Pulse Rate: 70 BP: 123/71 Patient Position (if appropriate): Lying Oxygen Therapy SpO2: 93 % O2 Device: Nasal Cannula Pain: Pain Assessment Pain Assessment: No/denies pain Pain Score: 0-No pain   See Function Navigator for Current Functional Status.   Therapy/Group: Individual Therapy  Lorie Phenix 09/16/2017, 5:59 AM

## 2017-09-16 NOTE — Progress Notes (Signed)
Physical Therapy Session Note  Patient Details  Name: Jaime Pierce MRN: [id number] Date of Birth: 05/07/1946  Today's Date: 09/16/2017 PT Individual Time: 1300-1406 PT Individual Time Calculation (min): 66 min   Short Term Goals: Week 1:  PT Short Term Goal 1 (Week 1): pt will be able to perform basic transfers with max assist of 1 PT Short Term Goal 2 (Week 1): pt will be able to complete sit <> stand with max assist of 1 PT Short Term Goal 3 (Week 1): pt will be able to propel w/c x 100' with min assist  Skilled Therapeutic Interventions/Progress Updates:   Pt in w/c upon arrival and agreeable to therapy, no c/o pain. Wife present, however leaving to go home to shower, returning later this afternoon. Total A w/c transport to day room and transferred to NuStep via squat pivot Max A x2. Pt required increased time to learn movement of NuStep and for foot and hand placement. Max A during first 20-30 sec for LE movement w/ LUE assist. LUE assisting for first 2-3 minutes, then LEs only w/ supervision up to 10 minutes @ L1. Goal to facilitate reciprocal movement pattern, LE dissociation, and reciprocal ms activation. Pt w/ decreased BLE tone after NuStep. Transferred back to w/c Max A x2 and worked on standing in parallel bars. Performed 1 sit<>stand w/ Max A x2 in bars. Max A to remain standing and multimodal cues for standing posture, LE activation, and manual placement of UEs. Pt w/ increased work of breathing and c/o fatigue after stand. Took 5-6 min to return to baseline. Total A transport back to room and Max A x2 to transfer to EOB and Max A sit<>supine. Adjusted in bed w/ 2nd helper. Ended session supine, call bell within reach and all needs met.   Therapy Documentation Precautions:  Precautions Precautions: Fall Precaution Comments: right sided weakness, right inattention Restrictions Weight Bearing Restrictions: No  See Function Navigator for Current Functional  Status.   Therapy/Group: Individual Therapy  Lakara Weiland K Arnette 09/16/2017, 2:08 PM

## 2017-09-16 NOTE — Progress Notes (Signed)
Speech Language Pathology Daily Session Note  Patient Details  Name: Jaime FuelRichard A Wisener MRN: [id number] Date of Birth: Feb 25, 1946  Today's Date: 09/16/2017 SLP Individual Time: 1000-1100 SLP Individual Time Calculation (min): 60 min  Short Term Goals: Week 1: SLP Short Term Goal 1 (Week 1): Patient will demonstrate sustained attention to functional tasks for ~30 minutes with Min A verbal cues for redirection.  SLP Short Term Goal 2 (Week 1): Patient will self-monitor and correct verbal errors at the phrase level with Min A verbal cues.  SLP Short Term Goal 3 (Week 1): Patient will follow 2 step directions with 25% accuracy and Max A verbal cues.  SLP Short Term Goal 4 (Week 1): Patient will demonstrate reading comprehension at the sentence level with Min A verbal cues.  SLP Short Term Goal 5 (Week 1): Patient will demonstrate functional problem solving for basic and familiar tasks with Mod A verbal cues.  SLP Short Term Goal 6 (Week 1): Patient will utilize word-finding strategies at the phrase level with Max A multimodal cues.   Skilled Therapeutic Interventions: Skilled treatment session focused on cognitive-linguistic goals. SLP facilitated session by providing extra time and Min A semantic and question cues for word-finding during a verbal description task at the sentence level. Overall, patient's verbal expression appeared improved from previous sessions. Patient transferred back to bed at end of session with total +2 assist via the [place]. Patient left supine in bed with all needs within reach. Continue with current plan of care.      Function:   Cognition Comprehension Comprehension assist level: Understands basic 75 - 89% of the time/ requires cueing 10 - 24% of the time  Expression   Expression assist level: Expresses basic 75 - 89% of the time/requires cueing 10 - 24% of the time. Needs helper to occlude trach/needs to repeat words.  Social Interaction Social Interaction assist  level: Interacts appropriately 75 - 89% of the time - Needs redirection for appropriate language or to initiate interaction.  Problem Solving Problem solving assist level: Solves basic 25 - 49% of the time - needs direction more than half the time to initiate, plan or complete simple activities  Memory Memory assist level: Recognizes or recalls 25 - 49% of the time/requires cueing 50 - 75% of the time    Pain Pain Assessment Pain Assessment: Faces Faces Pain Scale: No hurt  Therapy/Group: Individual Therapy  Ramonica Grigg 09/16/2017, 3:18 PM

## 2017-09-16 NOTE — Progress Notes (Signed)
Social Work Patient ID: Jaime Pierce, male   DOB: July 20, 1946, 71 y.o.   MRN: [id number]   Met with pt, spouse and daughter following team conference.  All aware and agreeable with targeted d/c date of 11/16 and min/ mod assist w/c goals overall.  We discussed probable need for ramp and will confirm with PT.  Family very supportive and involved.  Pleased that pt scheduled for neuropsychology consult as family concerned about pt's mood.  Will continue to follow for support and d/c planning needs.  Maleeya Peterkin, LCSW

## 2017-09-16 NOTE — Patient Care Conference (Signed)
Inpatient RehabilitationTeam Conference and Plan of Care Update Date: 09/14/2017   Time: 2:40 PM    Patient Name: Jaime Pierce      Medical Record Number: [id number]  Date of Birth: [birth year] Sex: Male         Room/Bed: 4W08C/4W08C-01 Payor Info: Payor: [employer] MEDICARE / Plan: [employer] MEDICARE HMO/PPO / Product Type: *No Product type* /    Admitting Diagnosis: Stroke  Admit Date/Time:  09/09/2017  6:52 PM Admission Comments: No comment available   Primary Diagnosis:  ICH (intracerebral hemorrhage) (HCC) Principal Problem: ICH (intracerebral hemorrhage) (HCC)  Patient Active Problem List   Diagnosis Date Noted  . Hyponatremia   . Benign essential HTN   . Intraparenchymal hemorrhage of brain (HCC) 09/09/2017  . 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 Gait disturbance, post-stroke   . Aphasia as late effect of stroke   . SAH (subarachnoid hemorrhage) (HCC) 09/02/2017  . B12 deficiency 09/02/2017  . ICH (intracerebral hemorrhage) (HCC) 09/01/2017  . Well adult exam 06/16/2016  . Cigarette smoker 02/23/2016  . COPD GOLD II  12/24/2014  . CAD (coronary artery disease) 07/22/2011  . HTN (hypertension) 07/22/2011  . Dyslipidemia 07/22/2011  . Smoker 07/22/2011    Expected Discharge Date: Expected Discharge Date: 10/08/17  Team Members Present: Physician leading conference: Dr. Faith RogueZachary Swartz Social Worker Present: Amada JupiterLucy Anaiz Qazi, LCSW Nurse Present: Carlean PurlMaryann Barbour, RN PT Present: Karolee StampsAlison Gray, PT OT Present: Roney MansJennifer Smith, OT SLP Present: Feliberto Gottronourtney Payne, SLP PPS Coordinator present : Tora DuckMarie Noel, RN, CRRN     Current Status/Progress Goal Weekly Team Focus  Medical   [facility] with right spastic hemiparesis and aphasia/apraxia  improve functional use of right side  nutrition, spasticity control,   Bowel/Bladder   Continent of Urine, incontient reported episodes [facility]  Maintain continence   ,Assess and address toileting needs QS and prn, offer toileting to patient on a routine/regular schedule    Swallow/Nutrition/ Hydration             ADL's   +2 STEDY/squat pivot for functional transfer, mod A bathing and UB self care, +2 assistance LB self care, set up/S for grooming task  S/set up for sitting balance,UB self care, and grooming, mod A toileting, min A all other goals  R NMR, balance, functional transfers, hemiplegic dressing technique,  cognition, safety awarness, pt/family education   Mobility   max to +2 assist overall for transfers and sit <> stands when able; increased tone in RUE/RLE  min assist w/c level transfers; supervision w/c propulsion  NMR, postural control, motor planning, cognition, endurance, transfers, standing, tone managemnet   Communication   Min-Mod A  Supervision   auditory comprehension, verbal expression of wants/needs, monitoring errors    Safety/Cognition/ Behavioral Observations  Mod A  Min A  problem solving, recall, attention, awareness    Pain   Denies pain   < 3  Assess pain QS and prn, medicate if needed prior to and after therapies   Skin   Has fungal rash to buttock/groin area, continue Nystatin Powder   Monitor and resolve fungal areas with current treatment and preventive measures to prevent further occurrences  Assess QS, assist with repostioning and turning Q2 hrs and prn, elevate Feet, HOB,     Rehab Goals Patient on target to meet rehab goals: Yes *See Care Plan and progress notes for long and short-term goals.     Barriers to  Discharge  Current Status/Progress Possible Resolutions Date Resolved   Physician    Medical stability  spasticity     ongoing ROM, splinting, antispasmodics, family ed      Nursing                  PT  Inaccessible home environment;Decreased caregiver support;Other (comments)  stairs to enter home - recommending ramp; tone management              OT                  SLP                SW                 Discharge Planning/Teaching Needs:  Plan to d/c home with wife who is able to provide 24/7 assistance  Teaching with wife ongoing - she is here daily   Team Discussion:  Improving language and reading comprehension;  Poor body awareness and motor planning.  Total assist +2 to stand with SARA.  More tone in arm than LE.  Having some urgency with bowels but cont bladder.  Mood seems depressed - neuropsychology consult placed.  Revisions to Treatment Plan:  None    Continued Need for Acute Rehabilitation Level of Care: The patient requires daily medical management by a physician with specialized training in physical medicine and rehabilitation for the following conditions: Daily direction of a multidisciplinary physical rehabilitation program to ensure safe treatment while eliciting the highest outcome that is of practical value to the patient.: Yes Daily medical management of patient stability for increased activity during participation in an intensive rehabilitation regime.: Yes Daily analysis of laboratory values and/or radiology reports with any subsequent need for medication adjustment of medical intervention for : Neurological problems  Brissia Delisa 09/16/2017, 11:06 AM

## 2017-09-16 NOTE — Progress Notes (Signed)
[facility] PHYSICAL MEDICINE & REHABILITATION     PROGRESS NOTE   Subjective/Complaints: Slept fairly well. Still has rash on back/buttocks. Tends to sweat a good bit at night  ROS: limited due to language/communication   Objective: Vital Signs: Blood pressure 123/71, pulse 70, temperature 97.9 F (36.6 C), temperature source Oral, resp. rate 18, height 6\' 1"  (1.854 m), weight 83.5 kg (184 lb), SpO2 93 %. No results found.  Recent Labs  09/16/17 0715  WBC 9.8  HGB 13.6  HCT 40.1  PLT 381    Recent Labs  09/16/17 0715  NA 132*  K 3.9  CL 96*  GLUCOSE 116*  BUN 13  CREATININE 0.73  CALCIUM 8.4*   CBG (last 3)  No results for input(s): GLUCAP in the last 72 hours.  Wt Readings from Last 3 Encounters:  09/09/17 83.5 kg (184 lb)  09/01/17 85.1 kg (187 lb 9.8 oz)  06/30/17 83 kg (183 lb)    Physical Exam:   Constitutional: He appears well-developed. NAD. HENT: Normocephalic. Atraumatic. Eyes: EOMare normal. No discharge. Cardiovascular: RRR without murmur. No JVD  Respiratory: CTA Bilaterally without wheezes or rales. Normal effort     GI: Bowel sounds are normal. He exhibits no distension.  Skin.warm and dry. Macular rash over buttock area/skin moist and warm. Neurological: He is alertand oriented.  He follows commands.  Expressive language deficits, more fluid though Motor: LUE/LLE: 5/5 proximal to distal  RUE/RLE: 0/5 proximal to distal, ?mild flexor synergy movement. 1-2/4, fluctuating tone RUE>RLE ?rigidity, cogwheeling of LLE this morning--appears to be somewhat apraxic with movement of this limb as well  Assessment/Plan: 1. 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 Physiatrist is providing close team supervision and 24 hour management of active medical problems listed below. Physiatrist and rehab team continue to  assess barriers to discharge/monitor patient progress toward functional and medical goals.  Function:  Bathing Bathing position   Position: Shower  Bathing parts Body parts bathed by patient: Chest, Abdomen, Front perineal area, Right upper leg, Left upper leg Body parts bathed by helper: Right arm, Left arm, Buttocks, Right lower leg, Left lower leg, Back  Bathing assist Assist Level:  (mod A)      Upper Body Dressing/Undressing Upper body dressing   What is the patient wearing?: Pull over shirt/dress     Pull over shirt/dress - Perfomed by patient: Put head through opening, Pull shirt over trunk Pull over shirt/dress - Perfomed by helper: Thread/unthread right sleeve, Thread/unthread left sleeve        Upper body assist Assist Level:  (mod A)      Lower Body Dressing/Undressing Lower body dressing   What is the patient wearing?: Pants, Non-skid slipper socks   Underwear - Performed by helper: Thread/unthread right underwear leg, Thread/unthread left underwear leg, Pull underwear up/down   Pants- Performed by helper: Thread/unthread right pants leg, Thread/unthread left pants leg, Pull pants up/down, Fasten/unfasten pants   Non-skid slipper socks- Performed by helper: Don/doff right sock, Don/doff left sock                  Lower body assist Assist for lower body dressing: 2 Helpers      Toileting Toileting Toileting activity did not occur: Safety/medical concerns   Toileting steps completed by helper: Adjust clothing prior to toileting, Performs perineal hygiene, Adjust clothing after toileting Toileting Assistive Devices: Grab  bar or rail  Toileting assist     Transfers Chair/bed transfer   Chair/bed transfer method: Squat pivot Chair/bed transfer assist level: 2 helpers Chair/bed transfer assistive device: Armrests     Locomotion Ambulation Ambulation activity did not occur: Safety/medical concerns         Wheelchair   Type: Manual Max wheelchair  distance: 25' Assist Level: Maximal assistance (Pt 25 - 49%)  Cognition Comprehension Comprehension assist level: Understands basic 50 - 74% of the time/ requires cueing 25 - 49% of the time  Expression Expression assist level: Expresses basic 75 - 89% of the time/requires cueing 10 - 24% of the time. Needs helper to occlude trach/needs to repeat words.  Social Interaction Social Interaction assist level: Interacts appropriately 75 - 89% of the time - Needs redirection for appropriate language or to initiate interaction.  Problem Solving Problem solving assist level: Solves basic 25 - 49% of the time - needs direction more than half the time to initiate, plan or complete simple activities  Memory Memory assist level: Recognizes or recalls 25 - 49% of the time/requires cueing 50 - 75% of the time   Medical Problem List and Plan: 1. 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 Noticeable gains in language    2. DVT Prophylaxis/Anticoagulation: SCDs. Monitor for any signs of DVT  -Dopplers negative for DVT 3. 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   -PRAFO RLE  -ROM with therapy, family 4. Mood: Provide emotional support 5. Neuropsych: This patient iscapable of making decisions on hisown behalf. 6. Skin/Wound Care: fungal rash persistent---resume diflucan for 5 days  -continuenystatin powder BID 7. Fluids/Electrolytes/Nutrition:  I personally reviewed the patient's labs today.   8.CAD with CABG.No chest pain or shortness of breath 9. Hypertension. Lopressor 25 mg daily   Controlled on 10/21 10.COPD/tobacco abuse. Counseling.Continue inhalers  -off oxygen 11. Hyponatremia:  -likely central  Sodium 132   10/25--   12. Mild leukocytosis: I personally reviewed the patient's labs today.     -wbc's down to 9.8          LOS (Days) 7 A FACE TO FACE EVALUATION WAS PERFORMED  Ranelle Oyster, MD 09/16/2017 8:42 AM

## 2017-09-16 NOTE — Progress Notes (Signed)
Occupational Therapy Session Note  Patient Details  Name: Jaime Pierce MRN: [id number] Date of Birth: 1946/03/12  Today's Date: 09/16/2017 OT Individual Time: 0801-0900 OT Individual Time Calculation (min): 59 min    Short Term Goals: Week 1:  OT Short Term Goal 1 (Week 1): Pt will transfer to BSC/toilet with MAX A 1 caregiver to decrease burden of care OT Short Term Goal 2 (Week 1): Pt will recall hemi dressing techniques with min question cues OT Short Term Goal 3 (Week 1): Pt will locate 4/4 items in R visual field with no more  than 1 VC OT Short Term Goal 4 (Week 1): pt will sit to stand with MAX A of 1 caregiver in prep for clothing management  Skilled Therapeutic Interventions/Progress Updates:    Pt received supine in bed with focus on ADL/self-care retraining. Pt sat EOB for UB/LB bathing, maintains static sitting balance with MinGuard for safety; requires Min-ModA during dynamic sitting activity using LUE and while leaning outside BOS. Pt requires ModA for bathing and UB dressing, utilizing HOH assist to facilitate using RUE to wash LUE. TotalA +2 for sit<>stand while spouse assisted with washing buttocks region. Pt unable to recall hemi dressing technique with questioning cues this session, requires ModA for UB dressing, total assist for LB dressing. Pt totalA via SARA for sit<>stand to advance shorts over hips and to transfer EOB to w/c. Pt left seated in w/c, call bell and needs within reach, spouse present.   Therapy Documentation Precautions:  Precautions Precautions: Fall Precaution Comments: right sided weakness, right inattention Restrictions Weight Bearing Restrictions: No   Pain: Pain Assessment Pain Assessment: Faces Faces Pain Scale: No hurt  See Function Navigator for Current Functional Status.   Therapy/Group: Individual Therapy  Orlando PennerBreanna L Toure Edmonds 09/16/2017, 12:09 PM

## 2017-09-17 ENCOUNTER — Inpatient Hospital Stay (HOSPITAL_COMMUNITY): Payer: Medicare HMO

## 2017-09-17 ENCOUNTER — Inpatient Hospital Stay (HOSPITAL_COMMUNITY): Payer: Medicare HMO | Admitting: Physical Therapy

## 2017-09-17 ENCOUNTER — Inpatient Hospital Stay (HOSPITAL_COMMUNITY): Payer: Medicare HMO | Admitting: Occupational Therapy

## 2017-09-17 ENCOUNTER — Inpatient Hospital Stay (HOSPITAL_COMMUNITY): Payer: Medicare HMO | Admitting: Speech Pathology

## 2017-09-17 DIAGNOSIS — L22 Diaper dermatitis: Secondary | ICD-10-CM

## 2017-09-17 DIAGNOSIS — B372 Candidiasis of skin and nail: Secondary | ICD-10-CM

## 2017-09-17 NOTE — Progress Notes (Signed)
Physical Therapy Weekly Progress Note  Patient Details  Name: Jaime Pierce MRN: [id number] Date of Birth: 1946-07-27  Beginning of progress report period: September 10, 2017 End of progress report period: September 17, 2017  Today's Date: 09/17/2017 PT Individual Time: 1300-1410 PT Individual Time Calculation (min): 70 min   Patient has met 1 of 3 short term goals. He is consistently transferring w/ Max A x1 during PT sessions via squat pivot or lateral scoot using a slide board. Slide board transfers significantly improve ease of transfer 2/2 pt's LE tone affecting squat pivot transfers. Static sitting balance is consistently performed w/ supervision and dynamic sitting balance w/ Min-Mod A. Sit<>stands require Max-Total A x2 2/2 LE tone and unbalance muscle activation, however pt able to maintain erect standing posture w/ Min-Mod A overall w/ UE support. W/c mobility is inconsistent, Min-Max A using L hemi technique. Pt is more consistently engaging in reciprocal LE movement patterns w/ Mod-Max multimodal cues.  Patient continues to demonstrate the following deficits muscle weakness and muscle joint tightness, decreased cardiorespiratoy endurance, impaired timing and sequencing, abnormal tone, unbalanced muscle activation, motor apraxia, ataxia, decreased coordination and decreased motor planning and decreased sitting balance, decreased standing balance, decreased postural control and decreased balance strategies and therefore will continue to benefit from skilled PT intervention to increase functional independence with mobility.  Patient progressing toward long term goals..  Continue plan of care.  PT Short Term Goals Week 1:  PT Short Term Goal 1 (Week 1): pt will be able to perform basic transfers with max assist of 1 PT Short Term Goal 1 - Progress (Week 1): Met PT Short Term Goal 2 (Week 1): pt will be able to complete sit <> stand with max assist of 1 PT Short Term Goal 2 - Progress  (Week 1): Partly met PT Short Term Goal 3 (Week 1): pt will be able to propel w/c x 100' with min assist PT Short Term Goal 3 - Progress (Week 1): Partly met Week 2:  PT Short Term Goal 1 (Week 2): Pt will perform sit>stand transfer w/ Max A x1  PT Short Term Goal 2 (Week 2): Pt will initiate LE reciprocal movement pattern in 50% of trials PT Short Term Goal 3 (Week 2): Pt will perform bed<>chair transfer w/ Mod A  PT Short Term Goal 4 (Week 2): Pt will maintain dynamic sitting balance w/ Min A  Skilled Therapeutic Interventions/Progress Updates:   Pt supine upon arrival and agreeable to therapy, no c/o pain. Transferred to EOB and to w/c via slide board transfer, Max A x1. Worked on [employer] this session w/ focus on trunk dissociation and facilitating reciprocal movement patterns. Re-attempted w/c mobility using BLEs for reciprocal movement pattern and ease of steering. Pt continues to require Max-Total A with this method. Total A w/c mobility to therapy gym and transferred to/from mat via same technique as above. Worked on static sitting balance for brief periods and engaged in LUE reaching tasks including crossing midline, trunk rotation, postural control, and R weight-shifting - Min-Mod A for balance during dynamic sitting tasks. Transferred to supine and performed LE PNF patterns D1/D2 w/ Min-Mod A for RLE, Min A LLE. This was much improved from previous PNF attempts w/ this therapist. Encouraged pt to perform reciprocal movements, required Max cueing to do so. Returned to edge of mat and back to w/c. Continued to work on LE reciprocal movement pattern w/ kinetron at [employer] setting. Pt able to perform w/ Max A to  initiate, but supervision to maintain for 4-5 minute periods. Returned to room Total A in w/c, ended session in w/c, call bell within reach and all needs met.   Therapy Documentation Precautions:  Precautions Precautions: Fall Precaution Comments: right sided weakness, right  inattention Restrictions Weight Bearing Restrictions: No  See Function Navigator for Current Functional Status.  Therapy/Group: Individual Therapy  Edessa Jakubowicz K Arnette 09/17/2017, 7:32 PM

## 2017-09-17 NOTE — Progress Notes (Signed)
Occupational Therapy Session Note  Patient Details  Name: Delora FuelRichard A Mendenhall MRN: [id number] Date of Birth: 1946/01/21  Today's Date: 09/17/2017 OT Individual Time: [id number] OT Individual Time Calculation (min): 58 min    Short Term Goals: Week 1:  OT Short Term Goal 1 (Week 1): Pt will transfer to BSC/toilet with MAX A 1 caregiver to decrease burden of care OT Short Term Goal 2 (Week 1): Pt will recall hemi dressing techniques with min question cues OT Short Term Goal 3 (Week 1): Pt will locate 4/4 items in R visual field with no more  than 1 VC OT Short Term Goal 4 (Week 1): pt will sit to stand with MAX A of 1 caregiver in prep for clothing management  Skilled Therapeutic Interventions/Progress Updates:    1:1. Focus of session on bed mobility, sitting balance, L attention, sequencing and UB dressing. OT dons pants with total A rolling B with step by step cueing/tactile cues for bed mobility to advance pants past hips. Pt supine>sitting EOB with MAX A for BLE management and trunk elevation. While attempting to transfer to w/c using squat pivot, pt persistently elevates LLE  Despite VC to put foot on floor 2/2 decreased motor planning. Pt having incontinent bowel movements in shorts. Pt completes bed mobility as stated above while OT completes posterior hygiene/dons clean shorts. Pt transfers to w/c with TOTAL A in sara plus. Exited session with pt seated in w/c with call light in reach, QRB donned and wife in room.  Therapy Documentation Precautions:  Precautions Precautions: Fall Precaution Comments: right sided weakness, right inattention Restrictions Weight Bearing Restrictions: No  See Function Navigator for Current Functional Status.   Therapy/Group: Individual Therapy  Shon HaleStephanie M Dewitt Judice 09/17/2017, 6:44 AM

## 2017-09-17 NOTE — Plan of Care (Signed)
Problem: RH SKIN INTEGRITY Goal: RH STG SKIN FREE OF INFECTION/BREAKDOWN Prevent skin breakdown or infection with mod assist  Outcome: Progressing Pt's rash is improving with Nyastatin powder

## 2017-09-17 NOTE — Progress Notes (Signed)
Occupational Therapy Session Note  Patient Details  Name: Jaime Pierce MRN: [id number] Date of Birth: [birth year]  Today's Date: 09/17/2017 OT Individual Time: [id number] OT Individual Time Calculation (min): 31 min   Short Term Goals: Week 1:  OT Short Term Goal 1 (Week 1): Pt will transfer to BSC/toilet with MAX A 1 caregiver to decrease burden of care OT Short Term Goal 2 (Week 1): Pt will recall hemi dressing techniques with min question cues OT Short Term Goal 3 (Week 1): Pt will locate 4/4 items in R visual field with no more  than 1 VC OT Short Term Goal 4 (Week 1): pt will sit to stand with MAX A of 1 caregiver in prep for clothing management    Skilled Therapeutic Interventions/Progress Updates:    Tx focus on Rt NMR, Rt attention, and attention during meaningful leisure engagement.   Pt greeted with daughter present. Daughter providing gentle AAROM of R UE per education she has received from therapists this week. Pt escorted to dayroom where he participated in modified golfing activity while seated. Focus on bilateral integration for holding putter while seated at edge of w/c. [facility] assist to maintain Rt hand grip, as pt would form Rt grip on putter but then lose it when he swung. Pt scanning to Rt visual field to locate golf ball and position it between feet. Provided sensory feedback to Rt UE/LE throughout to increase awareness to affected side, with OT positioned on Rt side while conversing with pt. At end of tx pt was escorted back to room. He was left with all needs with in reach and safety belt applied.   Therapy Documentation Precautions:  Precautions Precautions: Fall Precaution Comments: right sided weakness, right inattention Restrictions Weight Bearing Restrictions: No Pain: No c/o pain during tx    ADL:     See Function Navigator for Current Functional Status.   Therapy/Group: Individual Therapy  Anna Livers A Esias Mory 09/17/2017, 4:05 PM

## 2017-09-17 NOTE — Progress Notes (Signed)
Speech Language Pathology Weekly Progress and Session Note  Patient Details  Name: Jaime Pierce MRN: [id number] Date of Birth: Apr 03, 1946  Beginning of progress report period: September 10, 2017 End of progress report period: September 17, 2017  Today's Date: 09/17/2017 SLP Individual Time: 1015-1100 SLP Individual Time Calculation (min): 45 min  Short Term Goals: Week 1: SLP Short Term Goal 1 (Week 1): Patient will demonstrate sustained attention to functional tasks for ~30 minutes with Min A verbal cues for redirection.  SLP Short Term Goal 1 - Progress (Week 1): Met SLP Short Term Goal 2 (Week 1): Patient will self-monitor and correct verbal errors at the phrase level with Min A verbal cues.  SLP Short Term Goal 2 - Progress (Week 1): Met SLP Short Term Goal 3 (Week 1): Patient will follow 2 step directions with 25% accuracy and Max A verbal cues.  SLP Short Term Goal 3 - Progress (Week 1): Met SLP Short Term Goal 4 (Week 1): Patient will demonstrate reading comprehension at the sentence level with Min A verbal cues.  SLP Short Term Goal 4 - Progress (Week 1): Met SLP Short Term Goal 5 (Week 1): Patient will demonstrate functional problem solving for basic and familiar tasks with Mod A verbal cues.  SLP Short Term Goal 5 - Progress (Week 1): Not met SLP Short Term Goal 6 (Week 1): Patient will utilize word-finding strategies at the phrase level with Max A multimodal cues.  SLP Short Term Goal 6 - Progress (Week 1): Met    New Short Term Goals: Week 2: SLP Short Term Goal 1 (Week 2): Patient will demonstrate selective attention in a mildly distracting enviornment  to functional tasks for ~30 minutes with Min A verbal cues for redirection.  SLP Short Term Goal 2 (Week 2): Patient will self-monitor and correct verbal errors at the phrase level with supervision verbal cues.  SLP Short Term Goal 3 (Week 2): Patient will follow 2 step directions with 75% accuracy and Mod A verbal cues.   SLP Short Term Goal 4 (Week 2): Patient will demonstrate functional problem solving for basic and familiar tasks with Mod A verbal cues.  SLP Short Term Goal 5 (Week 2): Patient will utilize word-finding strategies at the phrase level with Min A multimodal cues.  SLP Short Term Goal 6 (Week 2): Patient will recall new, daily information with Mod A multimodal cues.   Weekly Progress Updates: Patient has made excellent gains and has met 5 of 6 STG's this reporting period. Currently, patient requires overall extra time and Min A verbal cues to expression his basic wants/needs and Mod-Max A verbal cues for auditory comprehension of mildly complex information. Patient also requires overall Max A multimodal cues to complete functional and familiar tasks safely in regards to problem solving, recall and attention. However, patient's function is also impacted by deficits in motor planning. Patient and family education is ongoing. Patient would benefit from continued skilled SLP intervention to maximize his cognitive-linguistic function and overall functional independence prior to discharge.       Intensity: Minumum of 1-2 x/day, 30 to 90 minutes Frequency: 3 to 5 out of 7 days Duration/Length of Stay: 11/16 Treatment/Interventions: Cognitive remediation/compensation;Cueing hierarchy;Functional tasks;Patient/family education;Therapeutic Activities;Environmental controls;Internal/external aids;Speech/Language facilitation   Daily Session  Skilled Therapeutic Interventions: Skilled treatment session focused on cognitive-linguistic goals. SLP facilitated session by providing Mod-Max A verbal cues for following 2 step auditory comprehension with 2-4 components with 50-75% accuracy. Patient also demonstrated overall increased motor planning  throughout session. Patient left upright in wheelchair with all needs within reach. Continue with current plan of care.      Function:   Eating Eating   Modified  Consistency Diet: No Eating Assist Level: More than reasonable amount of time;Set up assist for   Eating Set Up Assist For: Opening containers;Cutting food       Cognition Comprehension Comprehension assist level: Understands basic 75 - 89% of the time/ requires cueing 10 - 24% of the time  Expression   Expression assist level: Expresses basic 75 - 89% of the time/requires cueing 10 - 24% of the time. Needs helper to occlude trach/needs to repeat words.  Social Interaction Social Interaction assist level: Interacts appropriately 75 - 89% of the time - Needs redirection for appropriate language or to initiate interaction.  Problem Solving Problem solving assist level: Solves basic 25 - 49% of the time - needs direction more than half the time to initiate, plan or complete simple activities  Memory Memory assist level: Recognizes or recalls 25 - 49% of the time/requires cueing 50 - 75% of the time   Pain No/Denies Pain   Therapy/Group: Individual Therapy  Shayne Deerman 09/17/2017, 3:27 PM

## 2017-09-17 NOTE — Progress Notes (Signed)
[facility]     PROGRESS NOTE   Subjective/Complaints: Working with OT. No new issues. Still with rash on back/buttocks  ROS: pt denies nausea, vomiting, diarrhea, cough, shortness of breath or chest pain    Objective: Vital Signs: Blood pressure 124/74, pulse 71, temperature (!) 97.5 F (36.4 C), temperature source Axillary, resp. rate 14, height 6\' 1"  (1.854 m), weight 83.5 kg (184 lb), SpO2 95 %. No results found.  Recent Labs  09/16/17 0715  WBC 9.8  HGB 13.6  HCT 40.1  PLT 381    Recent Labs  09/16/17 0715  NA 132*  K 3.9  CL 96*  GLUCOSE 116*  BUN 13  CREATININE 0.73  CALCIUM 8.4*   CBG (last 3)  No results for input(s): GLUCAP in the last 72 hours.  Wt Readings from Last 3 Encounters:  09/09/17 83.5 kg (184 lb)  09/01/17 85.1 kg (187 lb 9.8 oz)  06/30/17 83 kg (183 lb)    Physical Exam:   Constitutional: He appears well-developed. NAD. HENT: Normocephalic. Atraumatic. Eyes: EOMare normal. No discharge. Cardiovascular: RRR without murmur. No JVD   Respiratory: CTA Bilaterally without wheezes or rales. Normal effort      GI: Bowel sounds are normal. He exhibits no distension.  Skin.warm and dry. Macular rash over buttock area and low back Neurological: He is alertand oriented.  He follows commands.  Expressive language deficits, more fluid though Motor: LUE/LLE: 5/5 proximal to distal  RUE/RLE: 0/5 proximal to distal, ?mild flexor synergy movement. 1-2/4, fluctuating tone RUE>RLE LLE-appears to be somewhat apraxic with movement of this limb as well  Assessment/Plan: 1. 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 Physiatrist is providing close team supervision and 24 hour management of active medical problems listed below. Physiatrist and rehab team continue to assess barriers  to discharge/monitor patient progress toward functional and medical goals.  Function:  Bathing Bathing position   Position: Sitting EOB  Bathing parts Body parts bathed by patient: Chest, Abdomen, Front perineal area, Right upper leg, Left upper leg Body parts bathed by helper: Right arm, Left arm, Buttocks, Right lower leg, Left lower leg, Back  Bathing assist Assist Level:  (ModA)      Upper Body Dressing/Undressing Upper body dressing   What is the patient wearing?: Pull over shirt/dress     Pull over shirt/dress - Perfomed by patient: Put head through opening, Pull shirt over trunk, Thread/unthread left sleeve Pull over shirt/dress - Perfomed by helper: Thread/unthread right sleeve        Upper body assist Assist Level:  (ModA)      Lower Body Dressing/Undressing Lower body dressing   What is the patient wearing?: Pants, Non-skid slipper socks   Underwear - Performed by helper: Thread/unthread right underwear leg, Thread/unthread left underwear leg, Pull underwear up/down   Pants- Performed by helper: Thread/unthread right pants leg, Thread/unthread left pants leg, Pull pants up/down, Fasten/unfasten pants   Non-skid slipper socks- Performed by helper: Don/doff right sock, Don/doff left sock                  Lower body assist Assist for lower body dressing: 2 Helpers      Toileting Toileting Toileting activity did not occur: Safety/medical concerns   Toileting steps completed by helper: Adjust clothing prior to toileting, Performs perineal hygiene, Adjust clothing after toileting Toileting Assistive Devices:  Grab bar or rail  Toileting assist     Transfers Chair/bed transfer   Chair/bed transfer method: Squat pivot Chair/bed transfer assist level: 2 helpers Chair/bed transfer assistive device: Bedrails Mechanical lift: Scientist, water qualityara   Locomotion Ambulation Ambulation activity did not occur: Safety/medical concerns         Wheelchair   Type: Manual Max  wheelchair distance: 25' Assist Level: Maximal assistance (Pt 25 - 49%)  Cognition Comprehension Comprehension assist level: Understands basic 75 - 89% of the time/ requires cueing 10 - 24% of the time  Expression Expression assist level: Expresses basic 75 - 89% of the time/requires cueing 10 - 24% of the time. Needs helper to occlude trach/needs to repeat words.  Social Interaction Social Interaction assist level: Interacts appropriately 75 - 89% of the time - Needs redirection for appropriate language or to initiate interaction.  Problem Solving Problem solving assist level: Solves basic 25 - 49% of the time - needs direction more than half the time to initiate, plan or complete simple activities  Memory Memory assist level: Recognizes or recalls 25 - 49% of the time/requires cueing 50 - 75% of the time   Medical Problem List and Plan: 1. 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      2. DVT Prophylaxis/Anticoagulation: SCDs. Monitor for any signs of DVT  -Dopplers negative for DVT 3. 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   -PRAFO RLE  -ROM with therapy, family  -no WHO indicated yet RUE 4. Mood: Provide emotional support 5. Neuropsych: This patient iscapable of making decisions on hisown behalf. 6. Skin/Wound Care: fungal rash persistent---resumed diflucan for 5 day course. May need 7 days  -continuenystatin powder BID  -keep skin dry/cool 7. Fluids/Electrolytes/Nutrition:  I personally reviewed the patient's labs today.   8.CAD with CABG.No chest pain or shortness of breath 9. Hypertension. Lopressor 25 mg daily   Controlled on 10/21 10.COPD/tobacco abuse. Counseling.Continue inhalers  -off oxygen 11. Hyponatremia:  -likely central  Sodium 132   10/25  12. Mild leukocytosis: .     -wbc's down to  9.8         LOS (Days) 8 A FACE TO FACE EVALUATION WAS PERFORMED  Ranelle OysterSWARTZ,Fanny Agan T, MD 09/17/2017 9:17 AM

## 2017-09-18 ENCOUNTER — Inpatient Hospital Stay (HOSPITAL_COMMUNITY): Payer: Medicare HMO | Admitting: Speech Pathology

## 2017-09-18 MED ORDER — NYSTATIN 100000 UNIT/GM EX POWD
Freq: Three times a day (TID) | CUTANEOUS | Status: DC
  Administered 2017-09-18 – 2017-10-14 (×64): via TOPICAL
  Filled 2017-09-18: qty 15

## 2017-09-18 NOTE — Plan of Care (Signed)
Problem: RH BOWEL ELIMINATION Goal: RH STG MANAGE BOWEL WITH ASSISTANCE STG Manage Bowel with min Assistance.   Outcome: Progressing Pt having multiple soft stool family requests to stop diflucon

## 2017-09-18 NOTE — Progress Notes (Signed)
[place] PHYSICAL MEDICINE & REHABILITATION     PROGRESS NOTE   Subjective/Complaints: Stomach upset, loose stools. Wife feels that diflucan might be causing. Rash looking better  ROS: pt denies nausea, vomiting, diarrhea, cough, shortness of breath or chest pain    Objective: Vital Signs: Blood pressure 132/71, pulse 77, temperature 98.5 F (36.9 C), temperature source Oral, resp. rate 16, height 6\' 1"  (1.854 m), weight 83.5 kg (184 lb), SpO2 92 %. No results found.  Recent Labs  09/16/17 0715  WBC 9.8  HGB 13.6  HCT 40.1  PLT 381    Recent Labs  09/16/17 0715  NA 132*  K 3.9  CL 96*  GLUCOSE 116*  BUN 13  CREATININE 0.73  CALCIUM 8.4*   CBG (last 3)  No results for input(s): GLUCAP in the last 72 hours.  Wt Readings from Last 3 Encounters:  09/09/17 83.5 kg (184 lb)  09/01/17 85.1 kg (187 lb 9.8 oz)  06/30/17 83 kg (183 lb)    Physical Exam:   Constitutional: He appears well-developed. NAD. HENT: Normocephalic. Atraumatic. Eyes: EOMare normal. No discharge. Cardiovascular: RRR without murmur. No JVD    Respiratory: CTA Bilaterally without wheezes or rales. Normal effort       GI: Bowel sounds are normal. He exhibits no distension.  Skin.warm and dry. Macular rash over buttock area and low back with some improvement Neurological: He is alertand oriented.  He follows commands.  Language more fluid Motor: LUE/LLE: 5/5 proximal to distal  RUE/RLE: 0/5 proximal to distal, ?mild flexor synergy movement. 1-2/4, fluctuating tone RUE>RLE--no changes LLE-appears to be somewhat apraxic with movement of this limb as well  Assessment/Plan: 1. 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 Physiatrist is providing close team supervision and 24 hour management of active medical problems listed below. Physiatrist and rehab team  continue to assess barriers to discharge/monitor patient progress toward functional and medical goals.  Function:  Bathing Bathing position   Position: Sitting EOB  Bathing parts Body parts bathed by patient: Chest, Abdomen, Front perineal area, Right upper leg, Left upper leg Body parts bathed by helper: Right arm, Left arm, Buttocks, Right lower leg, Left lower leg, Back  Bathing assist Assist Level:  (ModA)      Upper Body Dressing/Undressing Upper body dressing   What is the patient wearing?: Pull over shirt/dress     Pull over shirt/dress - Perfomed by patient: Put head through opening, Pull shirt over trunk, Thread/unthread left sleeve Pull over shirt/dress - Perfomed by helper: Thread/unthread right sleeve        Upper body assist Assist Level:  (ModA)      Lower Body Dressing/Undressing Lower body dressing   What is the patient wearing?: Pants, Non-skid slipper socks   Underwear - Performed by helper: Thread/unthread right underwear leg, Thread/unthread left underwear leg, Pull underwear up/down   Pants- Performed by helper: Thread/unthread right pants leg, Thread/unthread left pants leg, Pull pants up/down, Fasten/unfasten pants   Non-skid slipper socks- Performed by helper: Don/doff right sock, Don/doff left sock                  Lower body assist Assist for lower body dressing: 2 Helpers      Toileting Toileting Toileting activity did not occur: Safety/medical concerns   Toileting steps completed by helper: Adjust clothing prior to toileting, Adjust clothing after toileting, Performs  perineal hygiene Toileting Assistive Devices: Grab bar or rail  Toileting assist Assist level: Two helpers   Transfers Chair/bed transfer   Chair/bed transfer method: Lateral scoot Chair/bed transfer assist level: Maximal assist (Pt 25 - 49%/lift and lower) Chair/bed transfer assistive device: Sliding board Mechanical lift: Engineer, petroleum Ambulation  activity did not occur: Safety/medical concerns         Wheelchair   Type: Manual Max wheelchair distance: 25' Assist Level: Maximal assistance (Pt 25 - 49%)  Cognition Comprehension Comprehension assist level: Understands basic 75 - 89% of the time/ requires cueing 10 - 24% of the time  Expression Expression assist level: Expresses basic 75 - 89% of the time/requires cueing 10 - 24% of the time. Needs helper to occlude trach/needs to repeat words.  Social Interaction Social Interaction assist level: Interacts appropriately 75 - 89% of the time - Needs redirection for appropriate language or to initiate interaction.  Problem Solving Problem solving assist level: Solves basic 25 - 49% of the time - needs direction more than half the time to initiate, plan or complete simple activities  Memory Memory assist level: Recognizes or recalls 75 - 89% of the time/requires cueing 10 - 24% of the time   Medical Problem List and Plan: 1. 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 DVT Prophylaxis/Anticoagulation: SCDs. Monitor for any signs of DVT  -Dopplers negative for DVT 3. 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   -PRAFO RLE  -ROM with therapy, family  -no WHO indicated yet RUE 4. Mood: Provide emotional support 5. Neuropsych: This patient iscapable of making decisions on hisown behalf. 6. Skin/Wound Care: rash improving  -given GI intolerance will stop diflucan  -continuenystatin powder--increase to TID  -keep skin dry/cool---reviewed with pt/wife 7. Fluids/Electrolytes/Nutrition:  I personally reviewed the patient's labs today.   8.CAD with CABG.No chest pain or shortness of breath 9. Hypertension. Lopressor 25 mg daily   Controlled on 10/21 10.COPD/tobacco abuse. Counseling.Continue  inhalers  -off oxygen 11. Hyponatremia:  -likely central  Sodium 132   10/25  12. Mild leukocytosis: .     -wbc's down to 9.8--recheck next week         LOS (Days) 9 A FACE TO FACE EVALUATION WAS PERFORMED  Faith Rogue T, MD 09/18/2017 9:05 AM

## 2017-09-18 NOTE — Progress Notes (Signed)
Speech Language Pathology Daily Session Note  Patient Details  Name: Jaime Pierce MRN: [id number] Date of Birth: February 01, 1946  Today's Date: 09/18/2017 SLP Individual Time: 1115-1200 SLP Individual Time Calculation (min): 45 min  Short Term Goals: Week 2: SLP Short Term Goal 1 (Week 2): Patient will demonstrate selective attention in a mildly distracting enviornment  to functional tasks for ~30 minutes with Min A verbal cues for redirection.  SLP Short Term Goal 2 (Week 2): Patient will self-monitor and correct verbal errors at the phrase level with supervision verbal cues.  SLP Short Term Goal 3 (Week 2): Patient will follow 2 step directions with 75% accuracy and Mod A verbal cues.  SLP Short Term Goal 4 (Week 2): Patient will demonstrate functional problem solving for basic and familiar tasks with Mod A verbal cues.  SLP Short Term Goal 5 (Week 2): Patient will utilize word-finding strategies at the phrase level with Min A multimodal cues.  SLP Short Term Goal 6 (Week 2): Patient will recall new, daily information with Mod A multimodal cues.   Skilled Therapeutic Interventions: Skilled treatment session focused on communication goals. SLP facilitated session by providing Mod A cues for transfer using [employer] and for completion of game. Pt and SLP played [place] with Min A cues needed for functional details to complete description. Pt with flat affect and support given regarding recovery from CVA. Pt was returned to room, left upright in wheelchair with daughter present in room.      Function:    Cognition Comprehension Comprehension assist level: Understands basic 50 - 74% of the time/ requires cueing 25 - 49% of the time  Expression   Expression assist level: Expresses basic 75 - 89% of the time/requires cueing 10 - 24% of the time. Needs helper to occlude trach/needs to repeat words.  Social Interaction Social Interaction assist level: Interacts appropriately 75 - 89% of the time -  Needs redirection for appropriate language or to initiate interaction.  Problem Solving Problem solving assist level: Solves basic 25 - 49% of the time - needs direction more than half the time to initiate, plan or complete simple activities;Solves basic 50 - 74% of the time/requires cueing 25 - 49% of the time  Memory Memory assist level: Recognizes or recalls 75 - 89% of the time/requires cueing 10 - 24% of the time    Pain Pain Assessment Pain Assessment: No/denies pain Pain Score: 0-No pain  Therapy/Group: Individual Therapy  Jolynne Spurgin 09/18/2017, 12:33 PM

## 2017-09-19 ENCOUNTER — Inpatient Hospital Stay (HOSPITAL_COMMUNITY): Payer: Medicare HMO | Admitting: Speech Pathology

## 2017-09-19 ENCOUNTER — Inpatient Hospital Stay (HOSPITAL_COMMUNITY): Payer: Medicare HMO

## 2017-09-19 NOTE — Progress Notes (Signed)
[facility] PHYSICAL MEDICINE & REHABILITATION     PROGRESS NOTE   Subjective/Complaints: Stomach feeling better. Family noticed he was much more talkative yesterday. Slept well last night  ROS: pt denies nausea, vomiting, diarrhea, cough, shortness of breath or chest pain   Objective: Vital Signs: Blood pressure 119/83, pulse 75, temperature 97.8 F (36.6 C), temperature source Oral, resp. rate 18, height 6\' 1"  (1.854 m), weight 83.5 kg (184 lb), SpO2 92 %. No results found. No results for input(s): WBC, HGB, HCT, PLT in the last 72 hours. No results for input(s): NA, K, CL, GLUCOSE, BUN, CREATININE, CALCIUM in the last 72 hours.  Invalid input(s): CO CBG (last 3)  No results for input(s): GLUCAP in the last 72 hours.  Wt Readings from Last 3 Encounters:  09/09/17 83.5 kg (184 lb)  09/01/17 85.1 kg (187 lb 9.8 oz)  06/30/17 83 kg (183 lb)    Physical Exam:   Constitutional: He appears well-developed. NAD. HENT: Normocephalic. Atraumatic. Eyes: EOMare normal. No discharge. Cardiovascular: RRR without murmur. No JVD     Respiratory: CTA Bilaterally without wheezes or rales. Normal effort        GI: Bowel sounds are normal. He exhibits no distension.  Skin.warm and dry. Macular rash over buttock area and low back with some improvement Neurological: He is alertand oriented.  He follows commands.  Language more fluid Motor: LUE/LLE: 5/5 proximal to distal  RUE/RLE: 0/5 proximal to distal, ?mild flexor synergy movement. 1-2/4, fluctuating tone RUE>RLE--no changes LLE-apraxic  Assessment/Plan: 1. 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 Physiatrist is providing close team supervision and 24 hour management of active medical problems listed below. Physiatrist and rehab team continue to assess barriers to discharge/monitor patient  progress toward functional and medical goals.  Function:  Bathing Bathing position   Position: Shower  Bathing parts Body parts bathed by patient: Chest, Abdomen, Front perineal area, Right upper leg, Left upper leg Body parts bathed by helper: Right arm, Left arm, Chest, Abdomen, Front perineal area, Buttocks, Right upper leg, Left upper leg, Right lower leg, Left lower leg, Back  Bathing assist Assist Level: 2 helpers      Upper Body Dressing/Undressing Upper body dressing   What is the patient wearing?: Pull over shirt/dress     Pull over shirt/dress - Perfomed by patient: Put head through opening, Pull shirt over trunk, Thread/unthread left sleeve Pull over shirt/dress - Perfomed by helper: Thread/unthread right sleeve, Thread/unthread left sleeve        Upper body assist Assist Level: 2 helpers      Lower Body Dressing/Undressing Lower body dressing   What is the patient wearing?: Pants, Socks   Underwear - Performed by helper: Thread/unthread right underwear leg, Thread/unthread left underwear leg, Pull underwear up/down   Pants- Performed by helper: Thread/unthread right pants leg, Thread/unthread left pants leg, Pull pants up/down, Fasten/unfasten pants   Non-skid slipper socks- Performed by helper: Don/doff right sock, Don/doff left sock                  Lower body assist Assist for lower body dressing: 2 Helpers      Toileting Toileting Toileting activity did not occur: Safety/medical concerns   Toileting steps completed by helper: Adjust clothing prior to toileting, Adjust clothing after toileting, Performs perineal hygiene Toileting Assistive Devices: Grab bar or rail  Toileting assist Assist level:  Two helpers   Transfers Chair/bed transfer   Chair/bed transfer method: Lateral scoot Chair/bed transfer assist level: Maximal assist (Pt 25 - 49%/lift and lower) Chair/bed transfer assistive device: Sliding board Mechanical lift: Charity fundraiserara    Locomotion Ambulation Ambulation activity did not occur: Safety/medical concerns         Wheelchair   Type: Manual Max wheelchair distance: 25' Assist Level: Maximal assistance (Pt 25 - 49%)  Cognition Comprehension Comprehension assist level: Understands basic 75 - 89% of the time/ requires cueing 10 - 24% of the time  Expression Expression assist level: Expresses basic 75 - 89% of the time/requires cueing 10 - 24% of the time. Needs helper to occlude trach/needs to repeat words.  Social Interaction Social Interaction assist level: Interacts appropriately 75 - 89% of the time - Needs redirection for appropriate language or to initiate interaction.  Problem Solving Problem solving assist level: Solves basic 25 - 49% of the time - needs direction more than half the time to initiate, plan or complete simple activities  Memory Memory assist level: Recognizes or recalls 75 - 89% of the time/requires cueing 10 - 24% of the time   Medical Problem List and Plan: 1. 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 DVT Prophylaxis/Anticoagulation: SCDs. Monitor for any signs of DVT  -Dopplers negative for DVT 3. 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   -PRAFO RLE  -ROM with therapy, family  -no WHO indicated yet RUE 4. Mood: Provide emotional support 5. Neuropsych: This patient iscapable of making decisions on hisown behalf. 6. Skin/Wound Care: rash improving  -off diflucan due to GI intolerance  -continuenystatin powder--increase to TID  -keep skin dry/cool--  7. Fluids/Electrolytes/Nutrition:  I personally reviewed the patient's labs today.   8.CAD with CABG.No chest pain or shortness of breath 9. Hypertension. Lopressor 25 mg daily   Controlled on 10/21 10.COPD/tobacco abuse. Counseling.Continue  inhalers  -off oxygen 11. Hyponatremia:  -likely central  Sodium 132   10/25  12. Mild leukocytosis: .     -wbc's down to 9.8--recheck this week         LOS (Days) 10 A FACE TO FACE EVALUATION WAS PERFORMED  Ranelle OysterSWARTZ,Ladonna Vanorder T, MD 09/19/2017 9:27 AM

## 2017-09-19 NOTE — Progress Notes (Addendum)
Physical Therapy Note  Patient Details  Name: Delora FuelRichard A Mestas MRN: [id number] Date of Birth: April 28, 1946 Today's Date: 09/19/2017  1000-1100, 60 min individual tx Pain: none  tx focused on family ed with wife, bed mobility, transfers [facility], w/c propulsion and neuro re-ed.  Wife trained in PROM bil hips in bed, w/c propulsion and positioning RUE in w/c.  2nd person needed for [facility] transfer due to hypertonus RLE, with assistance to prevent R knee flexion pulling foot off of base plate of Stedy.   Neuro re-ed via forced use, multimodal cues for [employer] for R/ L alternating reciprocal movement wth visual feedback on R knee, leaning forward to improve glut activation.    Pt benefitted from using UE or LE individually for w/c propulsion x 50' before coordinating UE and LE in hemi techniques for longer distances.  Wife pushed pt back to room in w/c.  PT reviewed use of quick release belt with wife.  See function navigator for current status.    Cristobal Advani 09/19/2017, 7:58 AM

## 2017-09-20 ENCOUNTER — Inpatient Hospital Stay (HOSPITAL_COMMUNITY): Payer: Medicare HMO

## 2017-09-20 ENCOUNTER — Inpatient Hospital Stay (HOSPITAL_COMMUNITY): Payer: Medicare HMO | Admitting: Speech Pathology

## 2017-09-20 ENCOUNTER — Inpatient Hospital Stay (HOSPITAL_COMMUNITY): Payer: Medicare HMO | Admitting: Physical Therapy

## 2017-09-20 ENCOUNTER — Inpatient Hospital Stay (HOSPITAL_COMMUNITY): Payer: Medicare HMO | Admitting: Occupational Therapy

## 2017-09-20 MED ORDER — BACLOFEN 5 MG HALF TABLET
15.0000 mg | ORAL_TABLET | Freq: Three times a day (TID) | ORAL | Status: DC
  Administered 2017-09-20 – 2017-09-21 (×5): 15 mg via ORAL
  Filled 2017-09-20 (×5): qty 1

## 2017-09-20 NOTE — Progress Notes (Signed)
Speech Language Pathology Daily Session Note  Patient Details  Name: Jaime Pierce MRN: [id number] Date of Birth: 1946-07-08  Today's Date: 09/20/2017 SLP Individual Time: [id number] SLP Individual Time Calculation (min): 60 min  Short Term Goals: Week 2: SLP Short Term Goal 1 (Week 2): Patient will demonstrate selective attention in a mildly distracting enviornment  to functional tasks for ~30 minutes with Min A verbal cues for redirection.  SLP Short Term Goal 2 (Week 2): Patient will self-monitor and correct verbal errors at the phrase level with supervision verbal cues.  SLP Short Term Goal 3 (Week 2): Patient will follow 2 step directions with 75% accuracy and Mod A verbal cues.  SLP Short Term Goal 4 (Week 2): Patient will demonstrate functional problem solving for basic and familiar tasks with Mod A verbal cues.  SLP Short Term Goal 5 (Week 2): Patient will utilize word-finding strategies at the phrase level with Min A multimodal cues.  SLP Short Term Goal 6 (Week 2): Patient will recall new, daily information with Mod A multimodal cues.   Skilled Therapeutic Interventions: Skilled treatment session focused on cognitive-linguistic goals. SLP facilitated session by providing Min-Mod A verbal and visual cues for problem solving during a basic money management task. Patient also recalled functional events from the weekend with Min-Mod A verbal and sentience completion cues. Patient left upright in wheelchair with all needs within reach. Continue with current plan of care.       Function:   Cognition Comprehension Comprehension assist level: Understands basic 75 - 89% of the time/ requires cueing 10 - 24% of the time  Expression   Expression assist level: Expresses basic 75 - 89% of the time/requires cueing 10 - 24% of the time. Needs helper to occlude trach/needs to repeat words.  Social Interaction Social Interaction assist level: Interacts appropriately 75 - 89% of the time -  Needs redirection for appropriate language or to initiate interaction.  Problem Solving Problem solving assist level: Solves basic 75 - 89% of the time/requires cueing 10 - 24% of the time  Memory Memory assist level: Recognizes or recalls 50 - 74% of the time/requires cueing 25 - 49% of the time    Pain No/Denies Pain   Therapy/Group: Individual Therapy  Jaime Pierce 09/20/2017, 12:48 PM

## 2017-09-20 NOTE — Progress Notes (Signed)
Occupational Therapy Weekly Progress Note  Patient Details  Name: Jaime Pierce MRN: [id number] Date of Birth: 1946-08-10  Beginning of progress report period: September 10, 2017 End of progress report period: September 20, 2017  Today's Date: 09/20/2017 OT Individual Time: 0092-3300 OT Individual Time Calculation (min): 60 min    Patient has met 0 of 4 short term goals.  He is progressing in all areas but has not yet achieved enough motor control to be able to stand up with only max A and squat pivot transfer with only max A.  He continues to need mod A for UB dressing and multiple cues to scan for items in his R visual field.   He is progressing with trunk control to be able to sit safely unsupported and is now starting to wt shift to each side.  He is able to forward lean during transfers with tactile cues.  He attends to the sessions well, but does seem to get discouraged easily.    Patient continues to demonstrate the following deficits: decreased cardiorespiratoy endurance, abnormal tone, unbalanced muscle activation, motor apraxia and decreased coordination, decreased attention to right, decreased awareness, decreased memory and delayed processing and decreased standing balance and decreased postural control and therefore will continue to benefit from skilled OT intervention to enhance overall performance with BADL.  Patient not progressing toward long term goals at this point but he is making progress.  It is too soon to downgrade goals.  Goals may need to be adjusted next week.  Continue plan of care.  OT Short Term Goals Week 1:  OT Short Term Goal 1 (Week 1): Pt will transfer to BSC/toilet with MAX A 1 caregiver to decrease burden of care OT Short Term Goal 1 - Progress (Week 1): Progressing toward goal OT Short Term Goal 2 (Week 1): Pt will recall hemi dressing techniques with min question cues OT Short Term Goal 2 - Progress (Week 1): Progressing toward goal OT Short Term Goal  3 (Week 1): Pt will locate 4/4 items in R visual field with no more  than 1 VC OT Short Term Goal 3 - Progress (Week 1): Progressing toward goal OT Short Term Goal 4 (Week 1): pt will sit to stand with MAX A of 1 caregiver in prep for clothing management OT Short Term Goal 4 - Progress (Week 1): Progressing toward goal Week 2:  OT Short Term Goal 1 (Week 2): Pt will stand with max A of 1 to prepare for LB dressing. OT Short Term Goal 2 (Week 2): Pt will transfer bed to chair with max A of 1 to prepare for a safe transfer to a BSC. OT Short Term Goal 3 (Week 2): Pt will demonstrate improved praxis to don shirt with min A. OT Short Term Goal 4 (Week 2): Pt will attend to R side by using L hand to move R arm across body to prepare for rolling in bed.   Skilled Therapeutic Interventions/Progress Updates:    Pt seen this session to facilitate functional mobility skills to improve his overall self care I.  Pt received in bed. His spouse stated that she had already assisted him with UB self care and grooming. Pt worked on using his L hand to manage R arm with cues to fully roll onto his L side. With cues to push through his left elbow pt was able to sit up with mod A or less.    His wife provided A with the transfer by stabilizing  the board and the W/c.  Towel placed on board as pt was not dressed yet and he used L hand to push his hips R with cues to lean forward and max A overall.    Once in W/c, attempted to have pt stand at sink but he could not lift his hips even with TOTAL A.  Used stedy lift to work on sit to stand for [employer] and dressing.  Initially he needed max A from w/c to stand in stedy and then was able to repeat subsequent stands from stedy pads (70%) of stand to full stand with min A.  His R leg tone increases and moves his leg into adduction.    Once self care completed, worked on A/arom of LLE with foot on pillow case to slide back and forth with max cues for praxis. Repeated on RLE  with trace movement in knee flex, ext.  Encouraged wife to work on these exercises with him.    Repeated praxis testing of LUE and pt demonstrated moderate ideomotor apraxia, although his ideational is fairly functional for self feeding and grooming.    Pt resting in w/c with wife in room with him.  Therapy Documentation Precautions:  Precautions Precautions: Fall Precaution Comments: right sided weakness, right inattention Restrictions Weight Bearing Restrictions: No    Vital Signs: Oxygen Therapy SpO2: 91 % O2 Device: Not Delivered   Pain:  no c/o pain ADL:    See Function Navigator for Current Functional Status.   Therapy/Group: Individual Therapy  [place] 09/20/2017, 10:13 AM

## 2017-09-20 NOTE — Progress Notes (Signed)
[place] PHYSICAL MEDICINE & REHABILITATION     PROGRESS NOTE   Subjective/Complaints: Had a reasonable day yesterday. Didn't hav emuch therapy  ROS: pt denies nausea, vomiting, diarrhea, cough, shortness of breath or chest pain   Objective: Vital Signs: Blood pressure 134/85, pulse 85, temperature 97.9 F (36.6 C), temperature source Oral, resp. rate 18, height 6\' 1"  (1.854 m), weight 83.5 kg (184 lb), SpO2 98 %. No results found. No results for input(s): WBC, HGB, HCT, PLT in the last 72 hours. No results for input(s): NA, K, CL, GLUCOSE, BUN, CREATININE, CALCIUM in the last 72 hours.  Invalid input(s): CO CBG (last 3)  No results for input(s): GLUCAP in the last 72 hours.  Wt Readings from Last 3 Encounters:  09/09/17 83.5 kg (184 lb)  09/01/17 85.1 kg (187 lb 9.8 oz)  06/30/17 83 kg (183 lb)    Physical Exam:   Constitutional: He appears well-developed. NAD. HENT: Normocephalic. Atraumatic. Eyes: EOMare normal. No discharge. Cardiovascular: RRR without murmur. No JVD      Respiratory: CTA Bilaterally without wheezes or rales. Normal effort         GI: BS +, non-tender, non-distended .  Skin.warm and dry. Macular rash over buttock area and low back with some improvement Neurological: He is alertand oriented.  He follows commands.  Language more fluid Motor: LUE/LLE: 5/5 proximal to distal  RUE/RLE: 0/5 proximal to distal, ?mild flexor synergy movement. 1-2/4, fluctuating tone RUE>RLE--no changes LLE-apraxic  Assessment/Plan: 1. 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 Physiatrist is providing close team supervision and 24 hour management of active medical problems listed below. Physiatrist and rehab team continue to assess barriers to discharge/monitor patient progress toward functional and medical  goals.  Function:  Bathing Bathing position   Position: Shower  Bathing parts Body parts bathed by patient: Chest, Abdomen, Front perineal area, Right upper leg, Left upper leg Body parts bathed by helper: Right arm, Left arm, Chest, Abdomen, Front perineal area, Buttocks, Right upper leg, Left upper leg, Right lower leg, Left lower leg, Back  Bathing assist Assist Level: 2 helpers      Upper Body Dressing/Undressing Upper body dressing   What is the patient wearing?: Pull over shirt/dress     Pull over shirt/dress - Perfomed by patient: Put head through opening, Pull shirt over trunk, Thread/unthread left sleeve Pull over shirt/dress - Perfomed by helper: Thread/unthread right sleeve, Thread/unthread left sleeve        Upper body assist Assist Level: 2 helpers      Lower Body Dressing/Undressing Lower body dressing   What is the patient wearing?: Pants, Socks   Underwear - Performed by helper: Thread/unthread right underwear leg, Thread/unthread left underwear leg, Pull underwear up/down   Pants- Performed by helper: Thread/unthread right pants leg, Thread/unthread left pants leg, Pull pants up/down, Fasten/unfasten pants   Non-skid slipper socks- Performed by helper: Don/doff right sock, Don/doff left sock                  Lower body assist Assist for lower body dressing: 2 Helpers      Toileting Toileting Toileting activity did not occur: Safety/medical concerns   Toileting steps completed by helper: Adjust clothing prior to toileting, Adjust clothing after toileting, Performs perineal hygiene Toileting Assistive Devices: Grab bar or rail  Toileting assist Assist level: Two helpers   Transfers Chair/bed transfer  Chair/bed transfer method: Lateral scoot Chair/bed transfer assist level: 2 helpers Chair/bed transfer assistive device: Mechanical lift Mechanical lift: [employer] editortedy   Locomotion Ambulation Ambulation activity did not occur: Safety/medical concerns          Wheelchair   Type: Manual Max wheelchair distance: 100 Assist Level: Touching or steadying assistance (Pt > 75%)  Cognition Comprehension Comprehension assist level: Understands basic 75 - 89% of the time/ requires cueing 10 - 24% of the time  Expression Expression assist level: Expresses basic 75 - 89% of the time/requires cueing 10 - 24% of the time. Needs helper to occlude trach/needs to repeat words.  Social Interaction Social Interaction assist level: Interacts appropriately 75 - 89% of the time - Needs redirection for appropriate language or to initiate interaction.  Problem Solving Problem solving assist level: Solves basic 25 - 49% of the time - needs direction more than half the time to initiate, plan or complete simple activities  Memory Memory assist level: Recognizes or recalls 75 - 89% of the time/requires cueing 10 - 24% of the time   Medical Problem List and Plan: 1. 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 DVT Prophylaxis/Anticoagulation: SCDs. Monitor for any signs of DVT  -Dopplers negative for DVT 3. 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   -PRAFO RLE  -ROM with therapy, family  -no WHO indicated yet RUE 4. Mood: Provide emotional support 5. Neuropsych: This patient iscapable of making decisions on hisown behalf. 6. Skin/Wound Care: rash resolving  -off diflucan due to GI intolerance  -continuenystatin powder--increase to TID  -keep skin dry/cool--  7. Fluids/Electrolytes/Nutrition:  I personally reviewed the patient's labs today.   8.CAD with CABG.No chest pain or shortness of breath 9. Hypertension. Lopressor 25 mg daily   Controlled on 10/21 10.COPD/tobacco abuse. Counseling.Continue inhalers  -off oxygen 11.  Hyponatremia:  -likely central  Sodium 132   10/25  12. Mild leukocytosis: .     -wbc's down to 9.8--recheck later this week         LOS (Days) 11 A FACE TO FACE EVALUATION WAS PERFORMED  Ranelle OysterSWARTZ,Tekila Caillouet T, MD 09/20/2017 9:12 AM

## 2017-09-20 NOTE — Progress Notes (Signed)
Physical Therapy Note  Patient Details  Name: Jaime Pierce MRN: [id number] Date of Birth: 09-15-1946 Today's Date: 09/20/2017  [id number], 30 min individual tx Pain: none per pt  Pt sitting on Stedy after having used toilet with Misty StanleyStacey, Charity fundraiserN.    Wife present, and assisted as requested by PT.  Seated in Steady, pt washed bil hands in sink with hand over hand assistance, max cues for R hand.  Sit>< stand in Stedy x 3, with thick towel roll between feet and pillow between knees to assist with midline alignment.  In standing with max assist for upright trunk, wife as focal point in front of him,  wt shifting L><R x 10 cycles q stand.  Pt fatigues in standing, and is able to state that he feels unsteady.  W/c propulsion with pillow behind back, R lap tray, shoes donned, using hemi technique, x 125' on level tile, including 2 turns. Pt able to find his room with 1 cue to return.  Pt left resting in w/c with all needs within reach; wife present.  See function navigator for current status.   Dianelly Ferran 09/20/2017, 12:16 PM

## 2017-09-20 NOTE — Progress Notes (Signed)
Physical Therapy Session Note  Patient Details  Name: Jaime Pierce MRN: [id number] Date of Birth: 1946/02/08  Today's Date: 09/20/2017 PT Individual Time: 1530-1630 PT Individual Time Calculation (min): 60 min   Short Term Goals: Week 2:  PT Short Term Goal 1 (Week 2): Pt will perform sit>stand transfer w/ Max A x1  PT Short Term Goal 2 (Week 2): Pt will initiate LE reciprocal movement pattern in 50% of trials PT Short Term Goal 3 (Week 2): Pt will perform bed<>chair transfer w/ Mod A  PT Short Term Goal 4 (Week 2): Pt will maintain dynamic sitting balance w/ Min A  Skilled Therapeutic Interventions/Progress Updates: Pt received seated in bed, denies pain and agreeable to treatment. Supine>sit with HOB elevated, assist for RLE management. Stedy transfer bed>w/c and w/c <>mat table with maxA +2. Mirror utilized for Cablevision Systemsvisual feedback with partial sit <>stand from stedy seat x5 reps to assist with maintaining midline alignment. Seated reaching R/L to retrieve beanbags with LUE; moderate LOB to R side however able to recover to midline with S/min guard. Sit <>stand with L rail in hall, mirror feedback for alignment. First gait trial x4' with totalA for RLE progression and stance control, anterior bias in standing and required cues for posterior weight shift onto heels. Second gait trial x10' with maxA, +2 follow for w/c. Improved comfort with LLE progression and improved postural control. W/c propulsion x75' to return to room with L hemi technique. Remained seated in w/c with wife present at end of session, all needs in reach.      Therapy Documentation Precautions:  Precautions Precautions: Fall Precaution Comments: right sided weakness, right inattention Restrictions Weight Bearing Restrictions: No Pain: Pain Assessment Pain Assessment: No/denies pain   See Function Navigator for Current Functional Status.   Therapy/Group: Individual Therapy  [name] 09/20/2017,  4:42 PM

## 2017-09-21 ENCOUNTER — Inpatient Hospital Stay (HOSPITAL_COMMUNITY): Payer: Medicare HMO | Admitting: Physical Therapy

## 2017-09-21 ENCOUNTER — Inpatient Hospital Stay (HOSPITAL_COMMUNITY): Payer: Medicare HMO | Admitting: Speech Pathology

## 2017-09-21 ENCOUNTER — Inpatient Hospital Stay (HOSPITAL_COMMUNITY): Payer: Medicare HMO | Admitting: Occupational Therapy

## 2017-09-21 NOTE — Progress Notes (Signed)
Occupational Therapy Session Note  Patient Details  Name: Jaime Pierce MRN: [id number] Date of Birth: 1946-07-11  Today's Date: 09/21/2017 OT Individual Time: 0800-0900 OT Individual Time Calculation (min): 60 min    Short Term Goals: Week 1:  OT Short Term Goal 1 (Week 1): Pt will transfer to BSC/toilet with MAX A 1 caregiver to decrease burden of care OT Short Term Goal 1 - Progress (Week 1): Progressing toward goal OT Short Term Goal 2 (Week 1): Pt will recall hemi dressing techniques with min question cues OT Short Term Goal 2 - Progress (Week 1): Progressing toward goal OT Short Term Goal 3 (Week 1): Pt will locate 4/4 items in R visual field with no more  than 1 VC OT Short Term Goal 3 - Progress (Week 1): Progressing toward goal OT Short Term Goal 4 (Week 1): pt will sit to stand with MAX A of 1 caregiver in prep for clothing management OT Short Term Goal 4 - Progress (Week 1): Progressing toward goal Week 2:  OT Short Term Goal 1 (Week 2): Pt will stand with max A of 1 to prepare for LB dressing. OT Short Term Goal 2 (Week 2): Pt will transfer bed to chair with max A of 1 to prepare for a safe transfer to a BSC. OT Short Term Goal 3 (Week 2): Pt will demonstrate improved praxis to don shirt with min A. OT Short Term Goal 4 (Week 2): Pt will attend to R side by using L hand to move R arm across body to prepare for rolling in bed.   Skilled Therapeutic Interventions/Progress Updates:    Upon entering the room, pt supine in bed with wife present in room for observation and education. Pt with no c/o pain this session. Pt performed supine >sit with max A to EOB. Pt standing into STEDY from bed with max A for lifting and lowering. STEDY utilized to transfer pt into roll in shower chair for bathing. Pt required hand over hand assistance to wash self with R UE. Pt required mod cues for initiation and sequencing of shower task. Pt having large BM while in shower and unaware. Pt standing  from shower chair into STEDY while +2 utilized for LB clothing management for safety. STEDY utilized to transfer pt into wheelchair with focus on hemiplegic dressing techniques for UB. Pt remained in wheelchair with wife present in room at end of session. All needs within reach.   Therapy Documentation Precautions:  Precautions Precautions: Fall Precaution Comments: right sided weakness, right inattention Restrictions Weight Bearing Restrictions: No General:   Vital Signs: Therapy Vitals Temp: 98.4 F (36.9 C) Temp Source: Axillary Pulse Rate: 88 Resp: 20 BP: 127/64 Patient Position (if appropriate): Lying Oxygen Therapy SpO2: (!) 89 % O2 Device: Not Delivered  See Function Navigator for Current Functional Status.   Therapy/Group: Individual Therapy  Alen BleacherBradsher, Willene Holian P 09/21/2017, 5:41 PM

## 2017-09-21 NOTE — Progress Notes (Signed)
Physical Therapy Session Note  Patient Details  Name: Jaime Pierce MRN: [id number] Date of Birth: Jun 06, 1946  Today's Date: 09/21/2017 PT Individual Time: 1100-1200 PT Individual Time Calculation (min): 60 min   Short Term Goals: Week 2:  PT Short Term Goal 1 (Week 2): Pt will perform sit>stand transfer w/ Max A x1  PT Short Term Goal 2 (Week 2): Pt will initiate LE reciprocal movement pattern in 50% of trials PT Short Term Goal 3 (Week 2): Pt will perform bed<>chair transfer w/ Mod A  PT Short Term Goal 4 (Week 2): Pt will maintain dynamic sitting balance w/ Min A  Skilled Therapeutic Interventions/Progress Updates: Pt received seated in w/c with wife present, denies pain and agreeable to treatment. Sit <>stand x2 trials with anterior weight shift, cues for reaching forward on hall rail to facilitate weight shifting and proper body mechanics. Gait x2 trials x10' each with maxA and w/c follow +2. Improved RLE activation for swing phase and assist needed only to reduce scissoring on 75% of trials, occasional increased assist d/t toe drag on floor. Second trial performed with RLE GRAFO for improved foot clearance and knee control in stance. Mirror visual feedback used throughout. Transferred w/c >tall kneeling on mat table with bench for BUE support with maxA +2; performed for BLE symmetrical weight bearing, postural awareness and midline orientation. Improved postural control with each trial, propped on elbows for rest break. Therapist provided cues on upper back and cued pt to push back against therapist to reduce anterior bias. Squat pivot transfer to return to w/c with maxA +2. Remained seated in w/c at end of session, wife present and all needs in reach.      Therapy Documentation Precautions:  Precautions Precautions: Fall Precaution Comments: right sided weakness, right inattention Restrictions Weight Bearing Restrictions: No   See Function Navigator for Current Functional  Status.   Therapy/Group: Individual Therapy  [name] 09/21/2017, 3:41 PM

## 2017-09-21 NOTE — Progress Notes (Signed)
Speech Language Pathology Daily Session Note  Patient Details  Name: Jaime Pierce MRN: [id number] Date of Birth: 04/14/1946  Today's Date: 09/21/2017 SLP Individual Time: 0930-1030 SLP Individual Time Calculation (min): 60 min  Short Term Goals: Week 2: SLP Short Term Goal 1 (Week 2): Patient will demonstrate selective attention in a mildly distracting enviornment  to functional tasks for ~30 minutes with Min A verbal cues for redirection.  SLP Short Term Goal 2 (Week 2): Patient will self-monitor and correct verbal errors at the phrase level with supervision verbal cues.  SLP Short Term Goal 3 (Week 2): Patient will follow 2 step directions with 75% accuracy and Mod A verbal cues.  SLP Short Term Goal 4 (Week 2): Patient will demonstrate functional problem solving for basic and familiar tasks with Mod A verbal cues.  SLP Short Term Goal 5 (Week 2): Patient will utilize word-finding strategies at the phrase level with Min A multimodal cues.  SLP Short Term Goal 6 (Week 2): Patient will recall new, daily information with Mod A multimodal cues.   Skilled Therapeutic Interventions: Skilled treatment session focused on cognitive-linguistic goals. SLP facilitated session by providing Mod-Max A verbal and visual cues for problem solving during a 4 and 6 step picture sequencing task. However, patient verbally described the actions within the pictures with supervision verbal cues at the sentence level. Patient left upright in wheelchair with wife present. Continue with current plan of care.      Function:   Cognition Comprehension Comprehension assist level: Understands basic 75 - 89% of the time/ requires cueing 10 - 24% of the time  Expression   Expression assist level: Expresses basic 90% of the time/requires cueing < 10% of the time.  Social Interaction Social Interaction assist level: Interacts appropriately 75 - 89% of the time - Needs redirection for appropriate language or to  initiate interaction.  Problem Solving Problem solving assist level: Solves basic 75 - 89% of the time/requires cueing 10 - 24% of the time  Memory Memory assist level: Recognizes or recalls 50 - 74% of the time/requires cueing 25 - 49% of the time    Pain No/Denies Pain   Therapy/Group: Individual Therapy  [name] 09/21/2017, 12:30 PM

## 2017-09-21 NOTE — Progress Notes (Signed)
Physical Therapy Session Note  Patient Details  Name: Jaime Pierce MRN: [id number] Date of Birth: 1945-12-20  Today's Date: 09/21/2017 PT Individual Time: 1545-1615 PT Individual Time Calculation (min): 30 min   Short Term Goals: Week 2:  PT Short Term Goal 1 (Week 2): Pt will perform sit>stand transfer w/ Max A x1  PT Short Term Goal 2 (Week 2): Pt will initiate LE reciprocal movement pattern in 50% of trials PT Short Term Goal 3 (Week 2): Pt will perform bed<>chair transfer w/ Mod A  PT Short Term Goal 4 (Week 2): Pt will maintain dynamic sitting balance w/ Min A  Skilled Therapeutic Interventions/Progress Updates:   Pt sitting EOB upon arrival and agreeable to therapy, no c/o pain. Transferred to w/c via slide board transfer, Max A. Total A w/c transport to/from day room. Worked on endurance and strengthening this session while encouraging reciprocal movement pattern. Nustep @ L1 for 10 minutes w/ R adduction assist, occasional visual and verbal cues for technique, supervision overall to perform NuStep. Returned to room and ended session in w/c, call bell within reach and all needs met.   Therapy Documentation Precautions:  Precautions Precautions: Fall Precaution Comments: right sided weakness, right inattention Restrictions Weight Bearing Restrictions: No Vital Signs: Therapy Vitals Temp: 98.4 F (36.9 C) Temp Source: Axillary Pulse Rate: 88 Resp: 20 BP: 127/64 Patient Position (if appropriate): Lying Oxygen Therapy SpO2: (!) 89 % O2 Device: Not Delivered  See Function Navigator for Current Functional Status.   Therapy/Group: Individual Therapy  [name] 09/21/2017, 4:52 PM

## 2017-09-21 NOTE — Progress Notes (Signed)
[place] PHYSICAL MEDICINE & REHABILITATION     PROGRESS NOTE   Subjective/Complaints: Doing well. Had a good night sleep and cleaned plate in expectation of a "busy day"  ROS: pt denies nausea, vomiting, diarrhea, cough, shortness of breath or chest pain   Objective: Vital Signs: Blood pressure 127/63, pulse 83, temperature 99 F (37.2 C), temperature source Axillary, resp. rate 18, height 6\' 1"  (1.854 m), weight 83.5 kg (184 lb), SpO2 92 %. No results found. No results for input(s): WBC, HGB, HCT, PLT in the last 72 hours. No results for input(s): NA, K, CL, GLUCOSE, BUN, CREATININE, CALCIUM in the last 72 hours.  Invalid input(s): CO CBG (last 3)  No results for input(s): GLUCAP in the last 72 hours.  Wt Readings from Last 3 Encounters:  09/09/17 83.5 kg (184 lb)  09/01/17 85.1 kg (187 lb 9.8 oz)  06/30/17 83 kg (183 lb)    Physical Exam:   Constitutional: He appears well-developed. NAD. HENT: Normocephalic. Atraumatic. Eyes: EOMare normal. No discharge. Cardiovascular: RRR without murmur. No JVD       Respiratory: CTA Bilaterally without wheezes or rales. Normal effort          GI: BS +, non-tender, non-distended .  Skin.warm and dry. Macular rash over buttock area and low back with some improvement Neurological: He is alertand oriented.  Improving initiation and language Motor: LUE/LLE: 5/5 proximal to distal  RUE/RLE: 0/5 proximal to distal, ?mild flexor synergy movement. 1-2/4, fluctuating tone RUE>RLE--no changes LLE-apraxic Psych: affect more dynamic  Assessment/Plan: 1. 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 Physiatrist is providing close team supervision and 24 hour management of active medical problems listed below. Physiatrist and rehab team continue to assess barriers to discharge/monitor patient progress toward  functional and medical goals.  Function:  Bathing Bathing position   Position: Shower  Bathing parts Body parts bathed by patient: Chest, Abdomen, Front perineal area, Right upper leg, Left upper leg Body parts bathed by helper: Right arm, Left arm, Chest, Abdomen, Front perineal area, Buttocks, Right upper leg, Left upper leg, Right lower leg, Left lower leg, Back  Bathing assist Assist Level: 2 helpers      Upper Body Dressing/Undressing Upper body dressing   What is the patient wearing?: Pull over shirt/dress     Pull over shirt/dress - Perfomed by patient: Put head through opening, Pull shirt over trunk, Thread/unthread left sleeve Pull over shirt/dress - Perfomed by helper: Thread/unthread right sleeve, Thread/unthread left sleeve        Upper body assist Assist Level: 2 helpers      Lower Body Dressing/Undressing Lower body dressing   What is the patient wearing?: Pants, Socks   Underwear - Performed by helper: Thread/unthread right underwear leg, Thread/unthread left underwear leg, Pull underwear up/down   Pants- Performed by helper: Thread/unthread right pants leg, Thread/unthread left pants leg, Pull pants up/down, Fasten/unfasten pants   Non-skid slipper socks- Performed by helper: Don/doff right sock, Don/doff left sock                  Lower body assist Assist for lower body dressing: 2 Helpers      Toileting Toileting Toileting activity did not occur: Safety/medical concerns   Toileting steps completed by helper: Adjust clothing prior to toileting, Adjust clothing after toileting, Performs perineal hygiene Toileting Assistive Devices: Grab bar or rail  Toileting  assist Assist level: Two helpers   Transfers Chair/bed transfer   Chair/bed transfer method: Other Chair/bed transfer assist level: Maximal assist (Pt 25 - 49%/lift and lower) Chair/bed transfer assistive device: Mechanical lift Mechanical lift: Stedy   Locomotion Ambulation Ambulation  activity did not occur: Safety/medical concerns   Max distance: 10 Assist level: 2 helpers   Wheelchair   Type: Manual Max wheelchair distance: 125 Assist Level: Supervision or verbal cues  Cognition Comprehension Comprehension assist level: Understands basic 75 - 89% of the time/ requires cueing 10 - 24% of the time  Expression Expression assist level: Expresses basic 75 - 89% of the time/requires cueing 10 - 24% of the time. Needs helper to occlude trach/needs to repeat words.  Social Interaction Social Interaction assist level: Interacts appropriately 75 - 89% of the time - Needs redirection for appropriate language or to initiate interaction.  Problem Solving Problem solving assist level: Solves basic 75 - 89% of the time/requires cueing 10 - 24% of the time  Memory Memory assist level: Recognizes or recalls 50 - 74% of the time/requires cueing 25 - 49% of the time   Medical Problem List and Plan: 1. 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 DVT Prophylaxis/Anticoagulation: SCDs. Monitor for any signs of DVT  -Dopplers negative for DVT 3. Pain Management/spasticity:   -tolerating baclofen-titrated to 15mg  TID  -LLE rigidity due to apraxia??-- .   -PRAFO RLE  -ROM with therapy, family  -no WHO indicated yet RUE  -suspect he will be a botox candidate 4. Mood: Provide emotional support 5. Neuropsych: This patient iscapable of making decisions on hisown behalf. 6. Skin/Wound Care: rash resolving  -off diflucan due to GI intolerance  -continuenystatin powder--increase to TID  -keeping skin dry/cool--  7. Fluids/Electrolytes/Nutrition:  I personally reviewed the patient's labs today.   8.CAD with CABG.No chest pain or shortness of breath 9. Hypertension. Lopressor 25 mg daily   Controlled on 10/21 10.COPD/tobacco abuse. Counseling.Continue  inhalers  -off oxygen 11. Hyponatremia:  -likely central  Sodium 132   10/25  12. Mild leukocytosis: .     -wbc's down to 9.8--recheck Thursday         LOS (Days) 12 A FACE TO FACE EVALUATION WAS PERFORMED  Faith Rogue T, MD 09/21/2017 9:01 AM

## 2017-09-22 ENCOUNTER — Inpatient Hospital Stay (HOSPITAL_COMMUNITY): Payer: Medicare HMO | Admitting: Physical Therapy

## 2017-09-22 ENCOUNTER — Inpatient Hospital Stay (HOSPITAL_COMMUNITY): Payer: Medicare HMO | Admitting: Occupational Therapy

## 2017-09-22 ENCOUNTER — Inpatient Hospital Stay (HOSPITAL_COMMUNITY): Payer: Medicare HMO | Admitting: Speech Pathology

## 2017-09-22 MED ORDER — BACLOFEN 20 MG PO TABS
20.0000 mg | ORAL_TABLET | Freq: Three times a day (TID) | ORAL | Status: DC
  Administered 2017-09-22 – 2017-10-16 (×73): 20 mg via ORAL
  Filled 2017-09-22 (×73): qty 1

## 2017-09-22 NOTE — Progress Notes (Signed)
Occupational Therapy Session Note  Patient Details  Name: Jaime Pierce MRN: [id number] Date of Birth: [birth year]  Today's Date: 09/22/2017 OT Individual Time: 0830-0930 OT Individual Time Calculation (min): 60 min    Short Term Goals: Week 2:  OT Short Term Goal 1 (Week 2): Pt will stand with max A of 1 to prepare for LB dressing. OT Short Term Goal 2 (Week 2): Pt will transfer bed to chair with max A of 1 to prepare for a safe transfer to a BSC. OT Short Term Goal 3 (Week 2): Pt will demonstrate improved praxis to don shirt with min A. OT Short Term Goal 4 (Week 2): Pt will attend to R side by using L hand to move R arm across body to prepare for rolling in bed.   Skilled Therapeutic Interventions/Progress Updates:    Upon entering the room, pt supine in bed with wife present in room for observation. Pt able to verbalize correctly hemiplegic technique for LB dressing. Pt performed supine >sit with max A to EOB. Pt standing with max A and R knee blocked. OT assisted pt with pulling pants over B hips. Pt reaching for wheelchair to the L and preformed stand pivot transfer from bed >wheelchair with max A. OT assisted pt to day room. Therapist seated in front of pt and blocking R knee for standing. Session focused on sit <>stand, weight shift, and midline orientation. Pt standing x 2 for 5 minutes each time. Pt required min - mod A for standing balance and utilized mirror for visual feedback for midline orientation. Pt required manual facilitation for upright posture. Pt reaching to L for target and returning to midline with min assistance. Pt sitting secondary to fatigue. Pt propelled self in wheelchair 6175' with hemiplegic technique and supervision. Pt remained in chair with lap tray donned and call bell within reach.   Therapy Documentation Precautions:  Precautions Precautions: Fall Precaution Comments: right sided weakness, right inattention Restrictions Weight Bearing Restrictions:  No General:   Vital Signs: Oxygen Therapy SpO2: 97 % O2 Device: Not Delivered  See Function Navigator for Current Functional Status.   Therapy/Group: Individual Therapy  Alen BleacherBradsher, Mickle Campton P 09/22/2017, 9:34 AM

## 2017-09-22 NOTE — Progress Notes (Signed)
Nutrition Follow-up  DOCUMENTATION CODES:   Not applicable  INTERVENTION:  Continue Ensure Enlive po BID, each supplement provides 350 kcal and 20 grams of protein  Encourage adequate PO intake.   NUTRITION DIAGNOSIS:   Inadequate oral intake related to poor appetite as evidenced by per patient/family report; improving  GOAL:   Patient will meet greater than or equal to 90% of their needs; met  MONITOR:   PO intake, Supplement acceptance, Labs, Weight trends, Skin, I & O's  REASON FOR ASSESSMENT:   Malnutrition Screening Tool    ASSESSMENT:   71 y.o.right handed male with history of CAD with CABG, hypertension, tobacco abuse. Presented 09/01/2017 with acute onset of right sided weakness and facial droop. CT/MRI showed a 4.2 x 2.5 x 3.1 cm left parietal lobe intraparenchymal hematoma. Small-volume acute subarachnoid hemorrhage was in the adjacent left parietal lobe as well as at the anterior right frontal lobe.   Meal completion has been varied from 30-100% with most intake >/= 60%. Weight has been stable. Pt currently has Ensure ordered and has been consuming them. RD to continue with current orders to aid in adequate nutrition.   Diet Order:  Diet Heart Room service appropriate? Yes; Fluid consistency: Thin  EDUCATION NEEDS:   No education needs identified at this time  Skin:  Skin Assessment: Reviewed RN Assessment  Last BM:  10/29  Height:   Ht Readings from Last 1 Encounters:  09/09/17 _0  (1.854 m)    Weight:   Wt Readings from Last 1 Encounters:  09/22/17 184 lb 3.6 oz (83.6 kg)    Ideal Body Weight:  83.6 kg  BMI:  Body mass index is 24.31 kg/m.  Estimated Nutritional Needs:   Kcal:  2000-2200  Protein:  90-100 grams  Fluid:  2-2.2 L/day   Corrin Parker, MS, RD, LDN Pager # [phone] After hours/ weekend pager # [phone]

## 2017-09-22 NOTE — Patient Care Conference (Signed)
Inpatient RehabilitationTeam Conference and Plan of Care Update Date: 09/21/2017   Time: 2:35 PM    Patient Name: Jaime Pierce      Medical Record Number: [id number]  Date of Birth: 09/30/1946 Sex: Male         Room/Bed: 4W08C/4W08C-01 Payor Info: Payor: [employer] MEDICARE / Plan: [employer] MEDICARE HMO/PPO / Product Type: *No Product type* /    Admitting Diagnosis: Stroke  Admit Date/Time:  09/09/2017  6:52 PM Admission Comments: No comment available   Primary Diagnosis:  Intraparenchymal hemorrhage of brain (HCC) Principal Problem: Intraparenchymal hemorrhage of brain [facility])  Patient Active Problem List   Diagnosis Date Noted  . Hyponatremia   . Benign essential HTN   . Intraparenchymal hemorrhage of brain (HCC) 09/09/2017  . 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 Gait disturbance, post-stroke   . Aphasia as late effect of stroke   . SAH (subarachnoid hemorrhage) (HCC) 09/02/2017  . B12 deficiency 09/02/2017  . Well adult exam 06/16/2016  . Cigarette smoker 02/23/2016  . COPD GOLD II  12/24/2014  . CAD (coronary artery disease) 07/22/2011  . HTN (hypertension) 07/22/2011  . Dyslipidemia 07/22/2011  . Smoker 07/22/2011    Expected Discharge Date: Expected Discharge Date: 10/08/17  Team Members Present: Physician leading conference: Dr. Faith Rogue Social Worker Present: Amada Jupiter, LCSW Nurse Present: Ronny Bacon, RN PT Present: Karolee Stamps, PT OT Present: Callie Fielding, OT SLP Present: Jackalyn Lombard, SLP PPS Coordinator present : Tora Duck, RN, CRRN     Current Status/Progress Goal Weekly Team Focus  Medical   spastic tone a bit better. improving language and cogniiton,   improve tone RUE and RLE  skin care, tone mgt   Bowel/Bladder   continent of bowel and bladder, periods of incontinence per rn report. uses a urinal with assist emptying. Last BM 10/29.  remain continent of bowel and bladder.   Monitor for bowel and bladder function.   Swallow/Nutrition/ Hydration             ADL's   improved in trunk control to be able to use slide board with max A of 1,  min -mod A UB self care, total A LB from bed level or +2 with use of stedy   S/set up for sitting balance,UB self care, and grooming, mod A toileting, min A all other goals (goals may need to be downgraded by next week)  R NMR of tone reduction, praxis facilitation, functional transfers, ADL training, visual scanning,, cognition   Mobility   max assist> +2 Stedy transfer or slide board transfer; superviison w/c x 150 ; +2 gait x 10' using hallway rail  min assist w/c level transfers; supervision w/c propulsion, mod car transfer, mod assist gait x 30'  neuro re-ed, postural control, tone management, motor planning, cognition, activity tolerance, functional mobility,    Communication   Supervision  Supervision   complex word-finding    Safety/Cognition/ Behavioral Observations  Mod A  Min A  problem solving and recall    Pain   Denies pain this shift.  Pain less than or equal to 2.  Assess for pain q shift prn.   Skin   rash to buttocks, groin area. On nystatin powder scheduled.  No skin breakdown while in rehab and rash to resolve prior to discharge.  Assess skin q shift and prn.     Rehab Goals Patient on target to meet rehab goals: Yes *See Care  Plan and progress notes for long and short-term goals.     Barriers to Discharge  Current Status/Progress Possible Resolutions Date Resolved   Physician    Medical stability        continue plan for spastiicty control      Nursing                  PT                    OT                  SLP                SW                Discharge Planning/Teaching Needs:  Plan to d/c home with wife who is able to provide 24/7 assistance  Teaching with wife ongoing - she is here daily   Team Discussion:  Continues to make progress;  Tone on right side is decreasing.  Skin rash  better;  Cont b/b overall.  Improved trunk control.  Sl board tfs with Max assist.  Wife remains very attentive and involved.  PT started pre-gait work.  Improved expressive language.  Revisions to Treatment Plan:  None    Continued Need for Acute Rehabilitation Level of Care: The patient requires daily medical management by a physician with specialized training in physical medicine and rehabilitation for the following conditions: Daily direction of a multidisciplinary physical rehabilitation program to ensure safe treatment while eliciting the highest outcome that is of practical value to the patient.: Yes Daily medical management of patient stability for increased activity during participation in an intensive rehabilitation regime.: Yes Daily analysis of laboratory values and/or radiology reports with any subsequent need for medication adjustment of medical intervention for : Neurological problems  Janaysia Mcleroy 09/22/2017, 3:33 PM

## 2017-09-22 NOTE — Progress Notes (Signed)
Speech Language Pathology Daily Session Note  Patient Details  Name: Jaime Pierce A Derflinger MRN: [id number] Date of Birth: [birth year]  Today's Date: 09/22/2017 SLP Individual Time: 1300-1400 SLP Individual Time Calculation (min): 60 min  Short Term Goals: Week 2: SLP Short Term Goal 1 (Week 2): Patient will demonstrate selective attention in a mildly distracting enviornment  to functional tasks for ~30 minutes with Min A verbal cues for redirection.  SLP Short Term Goal 2 (Week 2): Patient will self-monitor and correct verbal errors at the phrase level with supervision verbal cues.  SLP Short Term Goal 3 (Week 2): Patient will follow 2 step directions with 75% accuracy and Mod A verbal cues.  SLP Short Term Goal 4 (Week 2): Patient will demonstrate functional problem solving for basic and familiar tasks with Mod A verbal cues.  SLP Short Term Goal 5 (Week 2): Patient will utilize word-finding strategies at the phrase level with Min A multimodal cues.  SLP Short Term Goal 6 (Week 2): Patient will recall new, daily information with Mod A multimodal cues.   Skilled Therapeutic Interventions: Skilled treatment session focused on cognitive-linguistic goals. SLP facilitated session by providing Max A verbal cues to generate a list of words when given a variety of letters but only required supervision-Min A verbal cues word-finding during a novel task. Patient able to attend to all tasks with Mod I in a maximally distracting environment. Patient left upright in wheelchair with wife present. Continue with current plan of care.      Function:   Cognition Comprehension Comprehension assist level: Understands basic 75 - 89% of the time/ requires cueing 10 - 24% of the time  Expression   Expression assist level: Expresses basic 90% of the time/requires cueing < 10% of the time.  Social Interaction Social Interaction assist level: Interacts appropriately 75 - 89% of the time - Needs redirection for  appropriate language or to initiate interaction.  Problem Solving Problem solving assist level: Solves basic 75 - 89% of the time/requires cueing 10 - 24% of the time  Memory Memory assist level: Recognizes or recalls 50 - 74% of the time/requires cueing 25 - 49% of the time    Pain No/Denies Pain   Therapy/Group: Individual Therapy  Torianna Junio 09/22/2017, 3:11 PM

## 2017-09-22 NOTE — Progress Notes (Signed)
Physical Therapy Session Note  Patient Details  Name: Jaime FuelRichard A Ruelas MRN: [id number] Date of Birth: 11/04/1946  Today's Date: 09/22/2017 PT Individual Time: 1115-1200 PT Individual Time Calculation (min): 45 min   Short Term Goals: Week 2:  PT Short Term Goal 1 (Week 2): Pt will perform sit>stand transfer w/ Max A x1  PT Short Term Goal 2 (Week 2): Pt will initiate LE reciprocal movement pattern in 50% of trials PT Short Term Goal 3 (Week 2): Pt will perform bed<>chair transfer w/ Mod A  PT Short Term Goal 4 (Week 2): Pt will maintain dynamic sitting balance w/ Min A  Skilled Therapeutic Interventions/Progress Updates: Pt received seated in w/c with wife present, denies pain and agreeable to treatment. Pre-gait weight shifts and LLE stepping forward/backward in prep for gait trial; maxA for sit >stand and mod/maxA for dynamic standing balance, cueing/facilitation at R knee for stance control. Gait with L rail x10' with maxA; assist for RLE placement and reducing scissoring. Gait x3 trials with three muskateers maxA +2, assist continued for RLE scissoring, however improved LLE step length, reduced R lateral lean/pushing with L extremities. Squat pivot +2 to/from mat for energy conservation. L lateral/anterior reaching with LUE to retrieve horseshoes on L side. Progressed activity with wife raising horseshoe overhead and performing sit >stand with maxA; maintained hand on horseshoe while lowering to sit to reduce tendency to push with LUE. Returned to room totalA; remained seated in w/c at end of session, all needs in reach. Discussed with pt's wife likely need for ramp at d/c; told wife that he may progress but stairs will likely still be very challenging, unsafe for wife to perform at home without skilled therapist assisting. Also discussed that it would be better to be prepared with ramp and then not need it, rather than hope he progresses enough to not need it and then be stuck at d/c.       Therapy Documentation Precautions:  Precautions Precautions: Fall Precaution Comments: right sided weakness, right inattention Restrictions Weight Bearing Restrictions: No   See Function Navigator for Current Functional Status.   Therapy/Group: Individual Therapy  [name] 09/22/2017, 1:18 PM

## 2017-09-22 NOTE — Progress Notes (Signed)
[facility]IL[facility]     PROGRESS NOTE   Subjective/Complaints: Feeling well. Noticed more movement in RLE. Moving right foot somewhat. Tone a little better. Tolerating baclofen without too many problems  ROS: pt denies nausea, vomiting, diarrhea, cough, shortness of breath or chest pain   Objective: Vital Signs: Blood pressure 136/75, pulse 79, temperature 97.9 F (36.6 C), temperature source Oral, resp. rate 18, height 6\' 1"  (1.854 m), weight 83.6 kg (184 lb 3.6 oz), SpO2 93 %. No results found. No results for input(s): WBC, HGB, HCT, PLT in the last 72 hours. No results for input(s): NA, K, CL, GLUCOSE, BUN, CREATININE, CALCIUM in the last 72 hours.  Invalid input(s): CO CBG (last 3)  No results for input(s): GLUCAP in the last 72 hours.  Wt Readings from Last 3 Encounters:  09/22/17 83.6 kg (184 lb 3.6 oz)  09/01/17 85.1 kg (187 lb 9.8 oz)  06/30/17 83 kg (183 lb)    Physical Exam:   Constitutional: He appears well-developed. NAD. HENT: Normocephalic. Atraumatic. Eyes: EOMare normal. No discharge. Cardiovascular: RRR without murmur. No JVD       Respiratory: CTA Bilaterally without wheezes or rales. Normal effort          GI: BS +, non-tender, non-distended .  Skin.warm and dry. Macular rash over buttock area and low back with some improvement Neurological: He is alertand oriented.  Improving initiation and language Motor: LUE/LLE: 5/5 proximal to distal  RUE: 0/5 proximal to distal, ?mild flexor synergy movement. RLE: 2/5 HF, 1/5 KE and ankle movement.  1-2/4, fluctuating tone RUE>RLE--no changes LLE-apraxic also Psych: affect more dynamic  Assessment/Plan: 1. 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 Physiatrist is providing close team supervision and 24 hour management of active medical problems  listed below. Physiatrist and rehab team continue to assess barriers to discharge/monitor patient progress toward functional and medical goals.  Function:  Bathing Bathing position   Position: Shower  Bathing parts Body parts bathed by patient: Chest, Abdomen, Front perineal area, Right upper leg, Left upper leg Body parts bathed by helper: Right arm, Left arm, Buttocks, Right lower leg, Left lower leg, Back  Bathing assist Assist Level:  (mod A)      Upper Body Dressing/Undressing Upper body dressing   What is the patient wearing?: Pull over shirt/dress     Pull over shirt/dress - Perfomed by patient: Put head through opening, Pull shirt over trunk Pull over shirt/dress - Perfomed by helper: Thread/unthread right sleeve, Thread/unthread left sleeve        Upper body assist Assist Level:  (mod A)      Lower Body Dressing/Undressing Lower body dressing   What is the patient wearing?: Pants, Socks   Underwear - Performed by helper: Thread/unthread right underwear leg, Thread/unthread left underwear leg, Pull underwear up/down   Pants- Performed by helper: Thread/unthread right pants leg, Thread/unthread left pants leg, Pull pants up/down, Fasten/unfasten pants   Non-skid slipper socks- Performed by helper: Don/doff right sock, Don/doff left sock                  Lower body assist Assist for lower body dressing: 2 Helpers      Toileting Toileting Toileting activity did not occur: Safety/medical concerns   Toileting steps completed by helper: Adjust clothing prior to toileting, Adjust clothing after toileting, Performs perineal hygiene Toileting Assistive  Devices: Grab bar or Customer service managerrail  Toileting assist Assist level: Two helpers   Transfers Chair/bed transfer   Chair/bed transfer method: Lateral scoot Chair/bed transfer assist level: Maximal assist (Pt 25 - 49%/lift and lower) Chair/bed transfer assistive device: Armrests Mechanical lift: Stedy    Locomotion Ambulation Ambulation activity did not occur: Safety/medical concerns   Max distance: 10 Assist level: 2 helpers   Wheelchair   Type: Manual Max wheelchair distance: 125 Assist Level: Supervision or verbal cues  Cognition Comprehension Comprehension assist level: Understands basic 75 - 89% of the time/ requires cueing 10 - 24% of the time  Expression Expression assist level: Expresses basic 90% of the time/requires cueing < 10% of the time.  Social Interaction Social Interaction assist level: Interacts appropriately 75 - 89% of the time - Needs redirection for appropriate language or to initiate interaction.  Problem Solving Problem solving assist level: Solves basic 75 - 89% of the time/requires cueing 10 - 24% of the time  Memory Memory assist level: Recognizes or recalls 50 - 74% of the time/requires cueing 25 - 49% of the time   Medical Problem List and Plan: 1. 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 DVT Prophylaxis/Anticoagulation: SCDs. Monitor for any signs of DVT  -Dopplers negative for DVT 3. Pain Management/spasticity:   -tolerating baclofen-titrated to 20mg  TID  -LLE rigidity due to apraxia??-- .   -PRAFO RLE  -ROM with therapy, family  -no WHO indicated yet RUE  -suspect he will be a botox candidate, discussed with him and wife 4. Mood: Provide emotional support 5. Neuropsych: This patient iscapable of making decisions on hisown behalf. 6. Skin/Wound Care: rash resolving  -off diflucan due to GI intolerance  -continuenystatin powder--increase to TID  -keeping skin dry/cool--  7. Fluids/Electrolytes/Nutrition:  I personally reviewed the patient's labs today.   8.CAD with CABG.No chest pain or shortness of breath 9. Hypertension. Lopressor 25 mg daily   Controlled on 10/21 10.COPD/tobacco abuse.  Counseling.Continue inhalers  -off oxygen 11. Hyponatremia:  -likely central  Sodium 132   10/25--recheck tomorrow  12. Mild leukocytosis: .     -wbc's down to 9.8--recheck tomorrow         LOS (Days) 13 A FACE TO FACE EVALUATION WAS PERFORMED  Ranelle OysterSWARTZ,ZACHARY T, MD 09/22/2017 8:42 AM

## 2017-09-22 NOTE — Progress Notes (Signed)
Social Work Patient ID: Jaime Pierce, male   DOB: 02/09/1946, 71 y.o.   MRN: [id number]   Met with pt, wife and daughter yesterday to review team conference.  All are pleased to hear that team feels pt is making steady progress as they feel they are seeing improvements daily.  Pt very happy to have begun gait work and wife notes his mood seems much improved since they started that as well.  Discussion of whether ramp is needed at home - deferred to PT.  Family and wife remain very involved and supportive and learning skills as they go along.  Will continue to follow for support and d/c planning needs.  Frederica Chrestman, LCSW

## 2017-09-22 NOTE — Progress Notes (Signed)
Occupational Therapy Session Note  Patient Details  Name: Jaime Pierce MRN: [id number] Date of Birth: October 31, 1946  Today's Date: 09/22/2017 OT Individual Time: [id number] OT Individual Time Calculation (min): 34 min    Short Term Goals: Week 2:  OT Short Term Goal 1 (Week 2): Pt will stand with max A of 1 to prepare for LB dressing. OT Short Term Goal 2 (Week 2): Pt will transfer bed to chair with max A of 1 to prepare for a safe transfer to a BSC. OT Short Term Goal 3 (Week 2): Pt will demonstrate improved praxis to don shirt with min A. OT Short Term Goal 4 (Week 2): Pt will attend to R side by using L hand to move R arm across body to prepare for rolling in bed.   Skilled Therapeutic Interventions/Progress Updates:    Took pt to the therapy gym via wheelchair.  Worked on transfer from wheelchair to therapy mat stand pivot, with max assist.  Pt with increased knee and hip flexion in the right LE in standing.  Worked on stretching to right scapula and internal rotators of the shoulder.  Had pt place RUE in weightbearing on the mat with max assist from therapist. Worked on weightbearing thru the RUE while reaching across his body with the LUE to target.  Mod assist for balance and for trace activation of the RUE to assist with return back to midline after reaching.  Placed RUE on mat as well for work on bilateral and reciprical scooting.  Max assist to complete both with decreased ability for pt to clear his hips for scooting forward or backwards.  Increased difficulty with initiation of movement as well.  Completed stand pivot back to the wheelchair with max assist.  Returned to room via wheelchair with squat pivot transfer back to bed at max assist level.  Finished session with RUE placed on pillows.  [name] present in room at end of session as well.    Therapy Documentation Precautions:  Precautions Precautions: Fall Precaution Comments: right sided weakness, right  inattention Restrictions Weight Bearing Restrictions: No   Pain: Pain Assessment Pain Assessment: No/denies pain ADL: See Function Navigator for Current Functional Status.   Therapy/Group: Individual Therapy OTR/L  Adryan Druckenmiller 09/22/2017, 3:53 PM

## 2017-09-23 ENCOUNTER — Inpatient Hospital Stay (HOSPITAL_COMMUNITY): Payer: Medicare HMO | Admitting: Speech Pathology

## 2017-09-23 ENCOUNTER — Inpatient Hospital Stay (HOSPITAL_COMMUNITY): Payer: Medicare HMO | Admitting: Physical Therapy

## 2017-09-23 ENCOUNTER — Inpatient Hospital Stay (HOSPITAL_COMMUNITY): Payer: Medicare HMO | Admitting: Occupational Therapy

## 2017-09-23 LAB — CBC
HCT: 43.9 % (ref 39.0–52.0)
HEMOGLOBIN: 15.2 g/dL (ref 13.0–17.0)
MCH: 31.9 pg (ref 26.0–34.0)
MCHC: 34.6 g/dL (ref 30.0–36.0)
MCV: 92 fL (ref 78.0–100.0)
PLATELETS: 405 10*3/uL — AB (ref 150–400)
RBC: 4.77 MIL/uL (ref 4.22–5.81)
RDW: 13.5 % (ref 11.5–15.5)
WBC: 8.4 10*3/uL (ref 4.0–10.5)

## 2017-09-23 LAB — BASIC METABOLIC PANEL
Anion gap: 11 (ref 5–15)
BUN: 17 mg/dL (ref 6–20)
CHLORIDE: 102 mmol/L (ref 101–111)
CO2: 23 mmol/L (ref 22–32)
CREATININE: 0.77 mg/dL (ref 0.61–1.24)
Calcium: 9.1 mg/dL (ref 8.9–10.3)
GFR calc non Af Amer: >60 mL/min (ref >60)
GLUCOSE: 128 mg/dL — AB (ref 65–99)
Potassium: 3.8 mmol/L (ref 3.5–5.1)
Sodium: 136 mmol/L (ref 135–145)

## 2017-09-23 NOTE — Progress Notes (Signed)
Physical Therapy Session Note  Patient Details  Name: Jaime Pierce MRN: [id number] Date of Birth: 14-Jan-1946  Today's Date: 09/23/2017 PT Individual Time: 1100-1200 PT Individual Time Calculation (min): 60 min   Short Term Goals: Week 2:  PT Short Term Goal 1 (Week 2): Pt will perform sit>stand transfer w/ Max A x1  PT Short Term Goal 2 (Week 2): Pt will initiate LE reciprocal movement pattern in 50% of trials PT Short Term Goal 3 (Week 2): Pt will perform bed<>chair transfer w/ Mod A  PT Short Term Goal 4 (Week 2): Pt will maintain dynamic sitting balance w/ Min A  Skilled Therapeutic Interventions/Progress Updates: Pt received seated in w/c, denies pain and agreeable to treatment. Stand pivot transfer w/c <>mat table maxA with facilitation for upright posture and tactile/verbal cues for L weight shift for midline orientation. Sit <>stand with elevated mat table with visual target reaching with LUE forward and to L side towards horseshoe, then horseshoe elevated to facilitate sit >stand. Standing weight shifts R/L with verbal/tactile cues for bumping rehab tech with L hip to facilitate L weight shift and reduce contraversive pushing. Performed 3 sets of 3 reps with rest breaks between d/t fatigue; improving initiation of sit >stand and stand>sit with reduced cues and assist with repetition. Standing weight shifts with LUE palm up on bedside table to prevent pushing but still give stable end point, visual target for weight shift. Gait x2 trials at 10' each with maxA, +2 for stabilizing bedside table and wife bringing w/c up behind pt at end of trial. Performed with slow speed to ensure proper weight shifting R/L before contralateral LE progression; again ongoing cues for L weight shift toward table, assist to maintain LUE in appropriate position to reduce pushing. Third gait trial performed "three muskateers" x15' with assist for RLE progression and stance control, cues for L weight shift.  Returned to room totalA in w/c by wife at end of session.   Therapy Documentation Precautions:  Precautions Precautions: Fall Precaution Comments: right sided weakness, right inattention Restrictions Weight Bearing Restrictions: No   See Function Navigator for Current Functional Status.   Therapy/Group: Individual Therapy  [name] 09/23/2017, 12:53 PM

## 2017-09-23 NOTE — Progress Notes (Signed)
Speech Language Pathology Daily Session Note  Patient Details  Name: Jaime Pierce MRN: [id number] Date of Birth: 02-07-1946  Today's Date: 09/23/2017 SLP Individual Time: 1430-1500 SLP Individual Time Calculation (min): 30 min  Short Term Goals: Week 2: SLP Short Term Goal 1 (Week 2): Patient will demonstrate selective attention in a mildly distracting enviornment  to functional tasks for ~30 minutes with Min A verbal cues for redirection.  SLP Short Term Goal 2 (Week 2): Patient will self-monitor and correct verbal errors at the phrase level with supervision verbal cues.  SLP Short Term Goal 3 (Week 2): Patient will follow 2 step directions with 75% accuracy and Mod A verbal cues.  SLP Short Term Goal 4 (Week 2): Patient will demonstrate functional problem solving for basic and familiar tasks with Mod A verbal cues.  SLP Short Term Goal 5 (Week 2): Patient will utilize word-finding strategies at the phrase level with Min A multimodal cues.  SLP Short Term Goal 6 (Week 2): Patient will recall new, daily information with Mod A multimodal cues.   Skilled Therapeutic Interventions: Skilled treatment session focused on cognitive-linguistic goals. SLP facilitated session by providing Min-Mod A verbal and visual cues for problem solving during a mildly complex, novel card task. Patient left upright in wheelchair with quick release belt in place. Continue with current plan of care.      Function:   Cognition Comprehension Comprehension assist level: Understands basic 75 - 89% of the time/ requires cueing 10 - 24% of the time  Expression   Expression assist level: Expresses basic 90% of the time/requires cueing < 10% of the time.  Social Interaction Social Interaction assist level: Interacts appropriately 75 - 89% of the time - Needs redirection for appropriate language or to initiate interaction.  Problem Solving Problem solving assist level: Solves basic 50 - 74% of the time/requires  cueing 25 - 49% of the time  Memory Memory assist level: Recognizes or recalls 50 - 74% of the time/requires cueing 25 - 49% of the time    Pain No/Denies Pain   Therapy/Group: Individual Therapy  [name] 09/23/2017, 3:05 PM

## 2017-09-23 NOTE — Progress Notes (Signed)
[facility]     PROGRESS NOTE   Subjective/Complaints: No new problems. Slept well  ROS: pt denies nausea, vomiting, diarrhea, cough, shortness of breath or chest pain   Objective: Vital Signs: Blood pressure 137/69, pulse 80, temperature 97.8 F (36.6 C), temperature source Oral, resp. rate 17, height 6\' 1"  (1.854 m), weight 83.5 kg (184 lb 1.6 oz), SpO2 95 %. No results found. No results for input(s): WBC, HGB, HCT, PLT in the last 72 hours. No results for input(s): NA, K, CL, GLUCOSE, BUN, CREATININE, CALCIUM in the last 72 hours.  Invalid input(s): CO CBG (last 3)  No results for input(s): GLUCAP in the last 72 hours.  Wt Readings from Last 3 Encounters:  09/23/17 83.5 kg (184 lb 1.6 oz)  09/01/17 85.1 kg (187 lb 9.8 oz)  06/30/17 83 kg (183 lb)    Physical Exam:   Constitutional: He appears well-developed. NAD. HENT: Normocephalic. Atraumatic. Eyes: EOMare normal. No discharge. Cardiovascular: RRR without murmur. No JVD        Respiratory: CTA Bilaterally without wheezes or rales. Normal effort          GI: BS +, non-tender, non-distended .  Skin.warm and dry. Rash nearly resolved Neurological: He is alertand oriented.  Improving initiation and language Motor: LUE/LLE: 5/5 proximal to distal  RUE: 0/5 proximal to distal, ?mild flexor synergy movement. RLE: 2/5 HF, 1/5 KE and ankle movement.   2/4,  Tone biceps, pecs RUE. RLE--tr-1/4 extensor tone LLE-apraxic also Psych: affect more dynamic  Assessment/Plan: 1. 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 Physiatrist is providing close team supervision and 24 hour management of active medical problems listed below. Physiatrist and rehab team continue to assess barriers to discharge/monitor patient progress toward functional and medical  goals.  Function:  Bathing Bathing position   Position: Shower  Bathing parts Body parts bathed by patient: Chest, Abdomen, Front perineal area, Right upper leg, Left upper leg Body parts bathed by helper: Right arm, Left arm, Buttocks, Right lower leg, Left lower leg, Back  Bathing assist Assist Level:  (mod A)      Upper Body Dressing/Undressing Upper body dressing   What is the patient wearing?: Pull over shirt/dress     Pull over shirt/dress - Perfomed by patient: Put head through opening, Pull shirt over trunk Pull over shirt/dress - Perfomed by helper: Thread/unthread right sleeve, Thread/unthread left sleeve        Upper body assist Assist Level:  (mod A)      Lower Body Dressing/Undressing Lower body dressing   What is the patient wearing?: Pants, Socks   Underwear - Performed by helper: Thread/unthread right underwear leg, Thread/unthread left underwear leg, Pull underwear up/down   Pants- Performed by helper: Thread/unthread right pants leg, Thread/unthread left pants leg, Pull pants up/down, Fasten/unfasten pants   Non-skid slipper socks- Performed by helper: Don/doff right sock, Don/doff left sock                  Lower body assist Assist for lower body dressing: 2 Helpers      Toileting Toileting Toileting activity did not occur: Safety/medical concerns   Toileting steps completed by helper: Adjust clothing prior to toileting, Adjust clothing after toileting, Performs perineal hygiene Toileting Assistive Devices: Grab bar or rail  Toileting assist Assist level: Two helpers   Transfers Chair/bed transfer  Chair/bed transfer method: Stand pivot Chair/bed transfer assist level: Maximal assist (Pt 25 - 49%/lift and lower) Chair/bed transfer assistive device: Armrests Mechanical lift: Stedy   Locomotion Ambulation Ambulation activity did not occur: Safety/medical concerns   Max distance: 10 Assist level: 2 helpers   Wheelchair   Type:  Manual Max wheelchair distance: 125 Assist Level: Supervision or verbal cues  Cognition Comprehension Comprehension assist level: Understands basic 75 - 89% of the time/ requires cueing 10 - 24% of the time  Expression Expression assist level: Expresses basic 90% of the time/requires cueing < 10% of the time.  Social Interaction Social Interaction assist level: Interacts appropriately 75 - 89% of the time - Needs redirection for appropriate language or to initiate interaction.  Problem Solving Problem solving assist level: Solves basic 50 - 74% of the time/requires cueing 25 - 49% of the time  Memory Memory assist level: Recognizes or recalls 50 - 74% of the time/requires cueing 25 - 49% of the time   Medical Problem List and Plan: 1. 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 DVT Prophylaxis/Anticoagulation: SCDs. Monitor for any signs of DVT  -Dopplers negative for DVT 3. Pain Management/spasticity:   -tolerating baclofen-titrated to 20mg  TID with positive effects   -PRAFO RLE  -ROM with therapy, family  -no WHO indicated yet RUE  -suspect he will be a botox candidate, discussed with him and wife 4. Mood: Provide emotional support 5. Neuropsych: This patient iscapable of making decisions on hisown behalf. 6. Skin/Wound Care: rash resolving  -off diflucan due to GI intolerance  -continuenystatin powder--increase to TID  -keeping skin dry/cool--  7. Fluids/Electrolytes/Nutrition:  I personally reviewed the patient's labs today.   8.CAD with CABG.No chest pain or shortness of breath 9. Hypertension. Lopressor 25 mg daily   Controlled on 11/1 10.COPD/tobacco abuse. Counseling.Continue inhalers  -off oxygen 11. Hyponatremia:  -likely central  Sodium 132: labs pending for today  12. Mild leukocytosis: .     -wbc's down to 9.8-- labs pending         LOS (Days)  14 A FACE TO FACE EVALUATION WAS PERFORMED  Ranelle Oyster, MD 09/23/2017 8:57 AM

## 2017-09-23 NOTE — Progress Notes (Signed)
Occupational Therapy Session Note  Patient Details  Name: Jaime Pierce MRN: [id number] Date of Birth: 1946-08-26  Today's Date: 09/23/2017 OT Individual Time: [id number] and 1535 and 1604 OT Individual Time Calculation (min): 68 min and 29 min    Short Term Goals: Week 2:  OT Short Term Goal 1 (Week 2): Pt will stand with max A of 1 to prepare for LB dressing. OT Short Term Goal 2 (Week 2): Pt will transfer bed to chair with max A of 1 to prepare for a safe transfer to a BSC. OT Short Term Goal 3 (Week 2): Pt will demonstrate improved praxis to don shirt with min A. OT Short Term Goal 4 (Week 2): Pt will attend to R side by using L hand to move R arm across body to prepare for rolling in bed.   Skilled Therapeutic Interventions/Progress Updates:    Session 1: Upon entering the room, pt supine in bed with wife present in room. Pt reporting need to have BM. Supine >sit with max A to EOB. Pt standing from bed with max A. Stand pivot transfer to L into wheelchair with mod multimodal cues and increased time for transition. OT assisted pt into bathroom via wheelchair and transferred with total A to R onto drop arm commode chair for squat pivot transfer. Pt having large BM. Pt standing from toilet with max A while second helper assisted with hygiene and pulling pants over B hips. Pt transferred with max stand pivot transfer into wheelchair. Pt shaving face with electric razor with mod cues for initiation and sequencing of task. Pt donning pull over shirt with hemiplegic technique with max multimodal cues for technique and mod A. Pt remaining in wheelchair at end of session with wife present in room.   Session 2: Upon entering the room, pt seated in wheelchair with wife present for session. OT assisted pt via wheelchair to day room for time management. Pt standing from wheelchair with max A and multimodal cues for sequence. Pt standing at table in weight bearing position on R UE while working on table  top activity. Pt standing for 12 minutes with R UE blocked and min - mod A for upright posture with manual facilitation. Pt going down onto R elbow when reaching across to table with L hand to obtain items. Pt returning to wheelchair secondary to fatigue. OT assisted pt back to room. Lap tray donned and caregiver remaining present. Call bell and all needed items within reach upon exiting the room.   Therapy Documentation Precautions:  Precautions Precautions: Fall Precaution Comments: right sided weakness, right inattention Restrictions Weight Bearing Restrictions: No General:    See Function Navigator for Current Functional Status.   Therapy/Group: Individual Therapy  Alen BleacherBradsher, Madisan Bice P 09/23/2017, 12:53 PM

## 2017-09-24 ENCOUNTER — Inpatient Hospital Stay (HOSPITAL_COMMUNITY): Payer: Medicare HMO | Admitting: Occupational Therapy

## 2017-09-24 ENCOUNTER — Inpatient Hospital Stay (HOSPITAL_COMMUNITY): Payer: Medicare HMO | Admitting: Physical Therapy

## 2017-09-24 ENCOUNTER — Inpatient Hospital Stay (HOSPITAL_COMMUNITY): Payer: Medicare HMO | Admitting: Speech Pathology

## 2017-09-24 NOTE — Progress Notes (Signed)
Occupational Therapy Session Note  Patient Details  Name: Jaime FuelRichard A Neu MRN: [id number] Date of Birth: [birth year]  Today's Date: 09/24/2017 OT Individual Time: [id number] OT Individual Time Calculation (min): 94 min    Short Term Goals: Week 2:  OT Short Term Goal 1 (Week 2): Pt will stand with max A of 1 to prepare for LB dressing. OT Short Term Goal 2 (Week 2): Pt will transfer bed to chair with max A of 1 to prepare for a safe transfer to a BSC. OT Short Term Goal 3 (Week 2): Pt will demonstrate improved praxis to don shirt with min A. OT Short Term Goal 4 (Week 2): Pt will attend to R side by using L hand to move R arm across body to prepare for rolling in bed.   Skilled Therapeutic Interventions/Progress Updates:    Tx focus on ADL retraining, balance, perceptual skills, and functional transfers.   Pt greeted supine in bed, breakfast just arrived and pt requesting to eat prior to tx. Pt transitioning to EOB with extra time and light Mod A for righting trunk. He ate breakfast EOB with setup, HOH for bilaterally integrating R UE during beverage mgt and while consuming fruit from cup. Spouse educated on using bilateral methods during meals for NMR. Afterwards pt transferred to w/c<TTB with Max A and support on Rt side. While transferring into shower, pt with increased ease while gripping vertical grab bar. Pt bathing while seated, able to maintain dynamic sitting balance with close supervision. Pt leaning laterally/anteriorally for OT to complete pericare. Due to positioning of shower and OT unable to protect Rt side, opted to remain seated vs. Standing today. Pt then transferred back to w/c with Max A to minimize pushing to Rt. Pt proceeding to dress w/c level at sink with Max A sit<stand with spouse assisting with elevating pants over hips while OT supported balance (max cuing for using visual aides to minimize pushing). 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 Emphasis placed on distinguishing Lt/Rt and up/down limb movements with instruction. Pt still with significant perceptual deficits and benefited from tactile and visual feedback. Pt donning overhead shirt with Mod A and able to thread L LE into pants with Min A for dynamic balance. Facilitated bilateral UE use during oral care at sink. Pt then reported he needed to void bowels. Pt transferring to toilet with use of Stedy. Once he was safely on toilet, he was left with spouse, who verbalized she would ring call light when he needed staff assist post voiding. NT made aware of pts position.   Therapy Documentation Precautions:  Precautions Precautions: Fall Precaution Comments: right sided weakness, right inattention Restrictions Weight Bearing Restrictions: No Vital Signs: Oxygen Therapy SpO2: 94 % O2 Device: Not Delivered Pain: No c/o pain during tx    ADL:      See Function Navigator for Current Functional Status.   Therapy/Group: Individual Therapy  Jordan Caraveo A Yanessa Hocevar 09/24/2017, 12:15 PM

## 2017-09-24 NOTE — Progress Notes (Signed)
Speech Language Pathology Weekly Progress and Session Note  Patient Details  Name: Jaime Pierce MRN: [id number] Date of Birth: 10/22/1946  Beginning of progress report period: September 17, 2017 End of progress report period: September 24, 2017  Today's Date: 09/24/2017 SLP Individual Time: 1335-1430 SLP Individual Time Calculation (min): 55 min  Short Term Goals: Week 2: SLP Short Term Goal 1 (Week 2): Patient will demonstrate selective attention in a mildly distracting enviornment  to functional tasks for ~30 minutes with Min A verbal cues for redirection.  SLP Short Term Goal 1 - Progress (Week 2): Met SLP Short Term Goal 2 (Week 2): Patient will self-monitor and correct verbal errors at the phrase level with supervision verbal cues.  SLP Short Term Goal 2 - Progress (Week 2): Met SLP Short Term Goal 3 (Week 2): Patient will follow 2 step directions with 75% accuracy and Mod A verbal cues.  SLP Short Term Goal 3 - Progress (Week 2): Met SLP Short Term Goal 4 (Week 2): Patient will demonstrate functional problem solving for basic and familiar tasks with Mod A verbal cues.  SLP Short Term Goal 4 - Progress (Week 2): Met SLP Short Term Goal 5 (Week 2): Patient will utilize word-finding strategies at the phrase level with Min A multimodal cues.  SLP Short Term Goal 5 - Progress (Week 2): Met SLP Short Term Goal 6 (Week 2): Patient will recall new, daily information with Mod A multimodal cues.  SLP Short Term Goal 6 - Progress (Week 2): Met    New Short Term Goals: Week 3: SLP Short Term Goal 1 (Week 3): Patient will recall new, daily information with Min A multimodal cues.  SLP Short Term Goal 2 (Week 3): Patient will demonstrate functional problem solving for basic and familiar tasks with Min A verbal cues.  SLP Short Term Goal 3 (Week 3): Patient will follow 2 step directions with 75% accuracy and Min A verbal cues.  SLP Short Term Goal 4 (Week 3): Patient will demonstrate selective  attention in a mildly distracting enviornment  to functional tasks for ~60 minutes with Mod I.  SLP Short Term Goal 5 (Week 3): Patient will verbally express mildly abstract thoughts at the phrase and sentence level with superivsion verbal cues.  SLP Short Term Goal 6 (Week 3): Patient will demonstrate reading comprehension at the phrase level with Min A verbal cues.   Weekly Progress Updates: Patient continues to make excellent progress and has met 6 of 6 STG's this reporting period. Currently, patient is verbally expressing his basic wants/needs at the phrase and sentence level with supervision verbal cues and extra time. Patient also demonstrates increased comprehension but continues to require Mod verbal cues for comprehension of complex information. Patient also continues to require overall Mod A verbal cues for problem solving, recall, awareness and attention with functional and familiar tasks. Patient and family education is ongoing. Patient would benefit from continued skilled SLP intervention to maximize his overall cognitive-linguistic function and functional independence prior to discharge.      Intensity: Minumum of 1-2 x/day, 30 to 90 minutes Frequency: 3 to 5 out of 7 days Duration/Length of Stay: 11/16 Treatment/Interventions: Cognitive remediation/compensation;Cueing hierarchy;Functional tasks;Patient/family education;Therapeutic Activities;Environmental controls;Internal/external aids;Speech/Language facilitation   Daily Session  Skilled Therapeutic Interventions: Skilled treatment session focused on cognitive goals. SLP facilitated session by providing Mod A verbal cues for recall of his current medications and Min-Mod A verbal and visual cues for functional problem solving while organizing a QID pill  box. Patient left upright in wheelchair with all needs within reach. Continue with current plan of care.       Function:    Cognition Comprehension Comprehension assist level:  Understands basic 75 - 89% of the time/ requires cueing 10 - 24% of the time  Expression   Expression assist level: Expresses basic 90% of the time/requires cueing < 10% of the time.  Social Interaction Social Interaction assist level: Interacts appropriately 75 - 89% of the time - Needs redirection for appropriate language or to initiate interaction.  Problem Solving Problem solving assist level: Solves basic 50 - 74% of the time/requires cueing 25 - 49% of the time  Memory Memory assist level: Recognizes or recalls 50 - 74% of the time/requires cueing 25 - 49% of the time   Pain No/Denies pain   Therapy/Group: Individual Therapy  Jaime Pierce 09/24/2017, 3:30 PM

## 2017-09-24 NOTE — Progress Notes (Signed)
[place] PHYSICAL MEDICINE & REHABILITATION     PROGRESS NOTE   Subjective/Complaints: Up at EOB eating with OT. Feeling well overall  ROS: pt denies nausea, vomiting, diarrhea, cough, shortness of breath or chest pain   Objective: Vital Signs: Blood pressure 129/77, pulse 68, temperature 99.3 F (37.4 C), temperature source Oral, resp. rate 16, height 6\' 1"  (1.854 m), weight 83.5 kg (184 lb 1.6 oz), SpO2 94 %. No results found.  Recent Labs  09/23/17 0929  WBC 8.4  HGB 15.2  HCT 43.9  PLT 405*    Recent Labs  09/23/17 0929  NA 136  K 3.8  CL 102  GLUCOSE 128*  BUN 17  CREATININE 0.77  CALCIUM 9.1   CBG (last 3)  No results for input(s): GLUCAP in the last 72 hours.  Wt Readings from Last 3 Encounters:  09/23/17 83.5 kg (184 lb 1.6 oz)  09/01/17 85.1 kg (187 lb 9.8 oz)  06/30/17 83 kg (183 lb)    Physical Exam:   Constitutional: He appears well-developed. NAD. HENT: Normocephalic. Atraumatic. Eyes: EOMare normal. No discharge. Cardiovascular: RRR without murmur. No JVD         Respiratory: CTA Bilaterally without wheezes or rales. Normal effort           GI: BS +, non-tender, non-distended .  Skin.warm and dry. Rash nearly resolved Neurological: He is alertand oriented.  Improving initiation and language Motor: LUE/LLE: 5/5 proximal to distal  RUE: 0/5 proximal to distal, ?mild flexor synergy movement. RLE: 2/5 HF, 1/5 KE and ankle movement.   2/4  Tone biceps, pecs RUE. RLE--tr-1/4 extensor tone--no changes today. Good sitting balance LLE-apraxic also Psych: affect more dynamic  Assessment/Plan: 1. 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 Physiatrist is providing close team supervision and 24 hour management of active medical problems listed below. Physiatrist and rehab team continue to assess barriers to  discharge/monitor patient progress toward functional and medical goals.  Function:  Bathing Bathing position   Position: Shower  Bathing parts Body parts bathed by patient: Chest, Abdomen, Front perineal area, Right upper leg, Left upper leg Body parts bathed by helper: Right arm, Left arm, Buttocks, Right lower leg, Left lower leg, Back  Bathing assist Assist Level:  (mod A)      Upper Body Dressing/Undressing Upper body dressing   What is the patient wearing?: Pull over shirt/dress     Pull over shirt/dress - Perfomed by patient: Put head through opening, Pull shirt over trunk Pull over shirt/dress - Perfomed by helper: Thread/unthread right sleeve, Thread/unthread left sleeve        Upper body assist Assist Level:  (mod A)      Lower Body Dressing/Undressing Lower body dressing   What is the patient wearing?: Pants, Socks   Underwear - Performed by helper: Thread/unthread right underwear leg, Thread/unthread left underwear leg, Pull underwear up/down   Pants- Performed by helper: Thread/unthread right pants leg, Thread/unthread left pants leg, Pull pants up/down, Fasten/unfasten pants   Non-skid slipper socks- Performed by helper: Don/doff right sock, Don/doff left sock                  Lower body assist Assist for lower body dressing: 2 Helpers      Toileting Toileting Toileting activity did not occur: Safety/medical concerns   Toileting steps completed by helper: Adjust clothing prior to toileting,  Performs perineal hygiene, Adjust clothing after toileting Toileting Assistive Devices: Grab bar or rail  Toileting assist Assist level: Two helpers   Transfers Chair/bed transfer   Chair/bed transfer method: Stand pivot Chair/bed transfer assist level: Maximal assist (Pt 25 - 49%/lift and lower) Chair/bed transfer assistive device: Armrests Mechanical lift: Stedy   Locomotion Ambulation Ambulation activity did not occur: Safety/medical concerns   Max  distance: 15 Assist level: 2 helpers   Wheelchair   Type: Manual Max wheelchair distance: 125 Assist Level: Supervision or verbal cues  Cognition Comprehension Comprehension assist level: Understands basic 75 - 89% of the time/ requires cueing 10 - 24% of the time  Expression Expression assist level: Expresses basic 90% of the time/requires cueing < 10% of the time.  Social Interaction Social Interaction assist level: Interacts appropriately 75 - 89% of the time - Needs redirection for appropriate language or to initiate interaction.  Problem Solving Problem solving assist level: Solves basic 50 - 74% of the time/requires cueing 25 - 49% of the time  Memory Memory assist level: Recognizes or recalls 50 - 74% of the time/requires cueing 25 - 49% of the time   Medical Problem List and Plan: 1. 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 DVT Prophylaxis/Anticoagulation: SCDs. Monitor for any signs of DVT  -Dopplers negative for DVT 3. Pain Management/spasticity:   -tolerating baclofen-titrated to 20mg  TID with positive effects   -PRAFO RLE  -ROM with therapy, family  -no WHO indicated yet RUE  -suspect he will be a botox candidate, discussed with him and wife 4. Mood: Provide emotional support 5. Neuropsych: This patient iscapable of making decisions on hisown behalf. 6. Skin/Wound Care: rash resolving  -off diflucan due to GI intolerance  -continuenystatin powder--increase to TID  -keeping skin dry/cool--  7. Fluids/Electrolytes/Nutrition:  I personally reviewed the patient's labs today.   8.CAD with CABG.No chest pain or shortness of breath 9. Hypertension. Lopressor 25 mg daily   Controlled on 11/1 10.COPD/tobacco abuse. Counseling.Continue inhalers  -off oxygen 11. Hyponatremia:  -likely central  Sodium 132: labs pending for today  12. Mild leukocytosis:      -wbc's down to 8.4 today         LOS (Days) 15 A FACE TO FACE EVALUATION WAS PERFORMED  Ranelle OysterSWARTZ,Disa Riedlinger T, MD 09/24/2017 8:49 AM

## 2017-09-24 NOTE — Progress Notes (Signed)
Physical Therapy Weekly Progress Note  Patient Details  Name: Jaime Pierce MRN: [id number] Date of Birth: [birth year]  Beginning of progress report period: September 17, 2017 End of progress report period: September 24, 2017  Today's Date: 09/24/2017 PT Individual Time: 7867-5449 PT Individual Time Calculation (min): 45 min   Patient has met 3 of 4 short term goals.  Pt performing bed mobility with modA, transfers with modA squat pivot, maxA stand pivot. Initiated gait training this week with focus on midline orientation, L weight shifting, LE coordination. 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 Pt is motivated and hard working despite significant deficits, and family is very support often present at most therapy sessions. Have discussed d/c planning with wife, and this therapist continues to recommend preparation for ramp at home.   Patient continues to demonstrate the following deficits muscle weakness and muscle joint tightness, decreased cardiorespiratoy endurance, impaired timing and sequencing, abnormal tone, unbalanced muscle activation, motor apraxia, decreased coordination and decreased motor planning, decreased midline orientation, decreased motor planning and ideational apraxia, decreased initiation, decreased problem solving, decreased safety awareness, decreased memory and delayed processing and decreased sitting balance, decreased standing balance, decreased postural control, hemiplegia and decreased balance strategies and therefore will continue to benefit from skilled PT intervention to increase functional independence with mobility.  Patient progressing toward long term goals..  Continue plan of care.  PT Short Term Goals Week 2:  PT Short Term Goal 1 (Week 2): Pt will perform sit>stand transfer w/ Max A x1  PT Short Term Goal 1 - Progress (Week 2): Met PT Short Term Goal 2 (Week 2): Pt will  initiate LE reciprocal movement pattern in 50% of trials PT Short Term Goal 2 - Progress (Week 2): Met PT Short Term Goal 3 (Week 2): Pt will perform bed<>chair transfer w/ Mod A  PT Short Term Goal 3 - Progress (Week 2): Met PT Short Term Goal 4 (Week 2): Pt will maintain dynamic sitting balance w/ Min A PT Short Term Goal 4 - Progress (Week 2): Met Week 3:  PT Short Term Goal 1 (Week 3): Pt will demonstrate transfers w/c <>bed with minA PT Short Term Goal 2 (Week 3): Pt will demonstrate bed mobility with minA from flat bed PT Short Term Goal 3 (Week 3): Pt will ambulate x20' with modA PT Short Term Goal 4 (Week 3): Pt will demonstrate sit <>stand with consistent modA  Skilled Therapeutic Interventions/Progress Updates: Pt received seated in w/c, denies pain and agreeable to treatment. Stand pivot transfer to mat table towards L side with maxA; unable to maintain RLE on floor during transfer and pivoted around on LLE. Sit <>stand from elevated mat table with L lateral reaching for target; mod/maxA to initiate stand then able to complete remainder of stand with minA, minA for standing balance with cues for L weight shift to aim hips towards rehab tech standing on L side. Activity progressed to sit<>stand from low bench with LEs straddled to reduce tone, increase BOS, facilitate lumbopelvic extension and anterior weight shift; much improved performance with reduced assist needed to facilitate or complete sit >stand. Squat pivot transfer back to w/c towards L side with bobath technique and small slow scoots. Gait x15' in hall with L rail and maxA; cue for aiming L hip towards L hand for RLE stepping and midline alignment. Returned to room totalA by family at completion of session.      Therapy  Documentation Precautions:  Precautions Precautions: Fall Precaution Comments: right sided weakness, right inattention Restrictions Weight Bearing Restrictions: No   See Function Navigator for Current  Functional Status.  Therapy/Group: Individual Therapy  Luberta Mutter 09/24/2017, 3:50 PM

## 2017-09-24 NOTE — Progress Notes (Signed)
Physical Therapy Session Note  Patient Details  Name: CAROLL CUNNINGTON MRN: [id number] Date of Birth: 1945/11/27  Today's Date: 09/24/2017 PT Individual Time: 1005-1030 PT Individual Time Calculation (min): 25 min   Short Term Goals: Week 1:  PT Short Term Goal 1 (Week 1): pt will be able to perform basic transfers with max assist of 1 PT Short Term Goal 1 - Progress (Week 1): Met PT Short Term Goal 2 (Week 1): pt will be able to complete sit <> stand with max assist of 1 PT Short Term Goal 2 - Progress (Week 1): Partly met PT Short Term Goal 3 (Week 1): pt will be able to propel w/c x 100' with min assist PT Short Term Goal 3 - Progress (Week 1): Partly met  Skilled Therapeutic Interventions/Progress Updates:   Pt received sitting in WC and agreeable to PT  Sit<>stand in Stedy x 10 with max assist. Max cues for improved sequencing of movement and improved posture to prevent trunk collapse. Cues also provided to maintain contact of L shin on support pad to prevent pushing tendencies to improve symmetry of movement.  Pt noted to have improved trunk control with increased attempted to maintain upright posture and prevent pushing to the R.   Patient returned to room and left sitting in [facility] with call bell in reach and all needs met.          Therapy Documentation Precautions:  Precautions Precautions: Fall Precaution Comments: right sided weakness, right inattention Restrictions Weight Bearing Restrictions: No Vital Signs: Oxygen Therapy SpO2: 94 % O2 Device: Not Delivered Pain:   0/10   See Function Navigator for Current Functional Status.   Therapy/Group: Individual Therapy  Lorie Phenix 09/24/2017, 10:30 AM

## 2017-09-24 NOTE — Progress Notes (Signed)
Pt places self on Home Cpap for the night. RT to monitor

## 2017-09-25 ENCOUNTER — Inpatient Hospital Stay (HOSPITAL_COMMUNITY): Payer: Medicare HMO | Admitting: Physical Therapy

## 2017-09-25 ENCOUNTER — Inpatient Hospital Stay (HOSPITAL_COMMUNITY): Payer: Medicare HMO | Admitting: Occupational Therapy

## 2017-09-25 ENCOUNTER — Inpatient Hospital Stay (HOSPITAL_COMMUNITY): Payer: Medicare HMO | Admitting: Speech Pathology

## 2017-09-25 NOTE — Progress Notes (Signed)
Patient ID: Jaime Pierce, male   DOB: 05/30/1946, 71 y.o.   MRN: [id number]   09/25/17.  Jaime Pierce is a 71 y.o. 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 Coronary artery disease   . Dyspnea on exertion   . Hyperlipidemia   . Hypertension   . Tobacco abuse       Subjective:  No new complaints.  Wife at bedside  Objective: Vital signs in last 24 hours: Temp:  [97.6 F (36.4 C)-97.7 F (36.5 C)] 97.6 F (36.4 C) (11/03 0356) Pulse Rate:  [77-85] 77 (11/03 0356) Resp:  [16-18] 16 (11/03 0356) BP: (122-135)/(68-73) 135/68 (11/03 0356) SpO2:  [92 %-94 %] 94 % (11/03 0356) Weight change:  Last BM Date: 09/23/17  Intake/Output from previous day: 11/02 0701 - 11/03 0700 In: 480 [P.O.:480] Out: 1626 [Urine:1625; Stool:1] Last cbgs: CBG (last 3)  No results for input(s): GLUCAP in the last 72 hours.   Physical Exam General: No apparent distress   HEENT: not dry Lungs: Normal effort. Lungs clear to auscultation, no crackles or wheezes. Cardiovascular: Regular rate and rhythm, no edema; remote sternotomy Abdomen: S/NT/ND; BS(+) Musculoskeletal:  unchanged Neurological: No new neurological deficits with dense right hemiparesis Wounds: N/A    Extremities:  SCDs in place.  No edema Mental state: Alert, oriented, cooperative    Lab Results: BMET    Component Value Date/Time   NA 136 09/23/2017 0929   K 3.8 09/23/2017 0929   CL 102 09/23/2017 0929   CO2 23 09/23/2017 0929   GLUCOSE 128 (H) 09/23/2017 0929   GLUCOSE 113 (H) 09/21/2006 0832   BUN 17 09/23/2017 0929   CREATININE 0.77 09/23/2017 0929   CALCIUM 9.1 09/23/2017 0929   GFRNONAA >60 09/23/2017 0929   GFRAA >60 09/23/2017 0929   CBC    Component Value Date/Time   WBC 8.4 09/23/2017 0929   RBC 4.77 09/23/2017 0929   HGB 15.2  09/23/2017 0929   HCT 43.9 09/23/2017 0929   PLT 405 (H) 09/23/2017 0929   MCV 92.0 09/23/2017 0929   MCH 31.9 09/23/2017 0929   MCHC 34.6 09/23/2017 0929   RDW 13.5 09/23/2017 0929   LYMPHSABS 1.4 09/09/2017 1932   MONOABS 1.9 (H) 09/09/2017 1932   EOSABS 0.4 09/09/2017 1932   BASOSABS 0.0 09/09/2017 1932   BP Readings from Last 3 Encounters:  09/25/17 135/68  09/09/17 124/62  06/30/17 110/62    Medications: I have reviewed the patient's current medications.  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  Continue CIR  Hypertension-well-controlled  Coronary artery disease status post CABG COPD stable History of hyponatremia.  Resolved   Length of stay, days: 16  Rogelia BogaKWIATKOWSKI,Sharline Lehane FRANK , MD 09/25/2017, 9:48 AM

## 2017-09-25 NOTE — Progress Notes (Signed)
Speech Language Pathology Daily Session Note  Patient Details  Name: Delora FuelRichard A Manasco MRN: [id number] Date of Birth: 05-15-1946  Today's Date: 09/25/2017 SLP Individual Time: 1015-1055 SLP Individual Time Calculation (min): 40 min  Short Term Goals: Week 3: SLP Short Term Goal 1 (Week 3): Patient will recall new, daily information with Min A multimodal cues.  SLP Short Term Goal 2 (Week 3): Patient will demonstrate functional problem solving for basic and familiar tasks with Min A verbal cues.  SLP Short Term Goal 3 (Week 3): Patient will follow 2 step directions with 75% accuracy and Min A verbal cues.  SLP Short Term Goal 4 (Week 3): Patient will demonstrate selective attention in a mildly distracting enviornment  to functional tasks for ~60 minutes with Mod I.  SLP Short Term Goal 5 (Week 3): Patient will verbally express mildly abstract thoughts at the phrase and sentence level with superivsion verbal cues.  SLP Short Term Goal 6 (Week 3): Patient will demonstrate reading comprehension at the phrase level with Min A verbal cues.   Skilled Therapeutic Interventions: Skilled treatment session focused on cognition goals. SLP facilitated session by providing novel game to elicit functional problem solving and following 2 step directions and expressing mildly abstract lists of words. Pt required Mod A for all aspects of game (Call It) - for comprehension of game rules, following/recall of rules and to express words within specific category. Pt able to list 2 of 3 requests items within category with Min A cues and required heavy semantic/sentence completion cues for last word in group of 3. Pt was returned to room, left upright in wheelchair with all needs within reach. Continue per current plan of care.      Function:    Cognition Comprehension Comprehension assist level: Understands basic 75 - 89% of the time/ requires cueing 10 - 24% of the time  Expression   Expression assist level:  Expresses basic 75 - 89% of the time/requires cueing 10 - 24% of the time. Needs helper to occlude trach/needs to repeat words.  Social Interaction Social Interaction assist level: Interacts appropriately 75 - 89% of the time - Needs redirection for appropriate language or to initiate interaction.  Problem Solving Problem solving assist level: Solves basic 50 - 74% of the time/requires cueing 25 - 49% of the time  Memory Memory assist level: Recognizes or recalls 50 - 74% of the time/requires cueing 25 - 49% of the time    Pain Pain Assessment Pain Assessment: No/denies pain Pain Score: 0-No pain  Therapy/Group: Individual Therapy  Halvor Behrend 09/25/2017, 11:01 AM

## 2017-09-25 NOTE — Progress Notes (Signed)
Occupational Therapy Session Note  Patient Details  Name: Jaime Pierce MRN: [id number] Date of Birth: August 18, 1946  Today's Date: 09/25/2017 OT Individual Time: 6962-95281305-1339 OT Individual Time Calculation (min): 34 min   Short Term Goals: Week 1:  OT Short Term Goal 1 (Week 1): Pt will transfer to BSC/toilet with MAX A 1 caregiver to decrease burden of care OT Short Term Goal 1 - Progress (Week 1): Progressing toward goal OT Short Term Goal 2 (Week 1): Pt will recall hemi dressing techniques with min question cues OT Short Term Goal 2 - Progress (Week 1): Progressing toward goal OT Short Term Goal 3 (Week 1): Pt will locate 4/4 items in R visual field with no more  than 1 VC OT Short Term Goal 3 - Progress (Week 1): Progressing toward goal OT Short Term Goal 4 (Week 1): pt will sit to stand with MAX A of 1 caregiver in prep for clothing management OT Short Term Goal 4 - Progress (Week 1): Progressing toward goal Week 2:  OT Short Term Goal 1 (Week 2): Pt will stand with max A of 1 to prepare for LB dressing. OT Short Term Goal 2 (Week 2): Pt will transfer bed to chair with max A of 1 to prepare for a safe transfer to a BSC. OT Short Term Goal 3 (Week 2): Pt will demonstrate improved praxis to don shirt with min A. OT Short Term Goal 4 (Week 2): Pt will attend to R side by using L hand to move R arm across body to prepare for rolling in bed.   Skilled Therapeutic Interventions/Progress Updates:    Tx focus on midline orientation, sit<stands, and Rt NMR during self care tasks.   Pt greeted in w/c after lunch. Reported he needed to urinate. Pt completing sit<stand in [place] with Max A and improved ability to maintain midline than in previous sessions. Pt transferring to toilet with improved eccentric control, receptive to cues to use visual markers for obtaining neutral alignment. Pt requiring to use urinal to void due to positioning on toilet. Pt successful with voiding bladder, and then stood  again in [place], with once again, improved midline during power up and while seated on paddles back into room. Pt completed handwashing w/c level with step by step instructions and HOH for elevating R UE onto sink. Pt integrating bilateral UEs with extra time and cuing during task and then for drying. At end of tx pt was left seated in w/c with family present.   Therapy Documentation Precautions:  Precautions Precautions: Fall Precaution Comments: right sided weakness, right inattention Restrictions Weight Bearing Restrictions: No General:   Vital Signs: Therapy Vitals Temp: 97.8 F (36.6 C) Temp Source: Oral Pulse Rate: 87 Resp: 18 BP: 129/71 Patient Position (if appropriate): Sitting Oxygen Therapy SpO2: 95 % O2 Device: Not Delivered Pain:  No c/o pain during tx    ADL:      See Function Navigator for Current Functional Status.   Therapy/Group: Individual Therapy  Korena Nass A Shailah Gibbins 09/25/2017, 3:48 PM

## 2017-09-25 NOTE — Progress Notes (Signed)
Physical Therapy Session Note  Patient Details  Name: Jaime Pierce MRN: [id number] Date of Birth: 07/21/1946  Today's Date: 09/25/2017 PT Individual Time: 1400-1500 PT Individual Time Calculation (min): 60 min   Short Term Goals: Week 3:  PT Short Term Goal 1 (Week 3): Pt will demonstrate transfers w/c <>bed with minA PT Short Term Goal 2 (Week 3): Pt will demonstrate bed mobility with minA from flat bed PT Short Term Goal 3 (Week 3): Pt will ambulate x20' with modA PT Short Term Goal 4 (Week 3): Pt will demonstrate sit <>stand with consistent modA  Skilled Therapeutic Interventions/Progress Updates:   Pt in w/c upon arrival and agreeable to therapy, no c/o pain. Wife and daughter present throughout session and requesting activities pt can perform tomorrow when he doesn't have PT scheduled. Total A w/c transport to gym and worked on sit<>stands at L parallel bar. Pt performed sit<>stand x5 w/ Mod-Max A for initiation and Min A to maintain static standing w/ LUE support. Manual cues at R quad for knee extension in stance. Emphasis on increasing upright posture w/ visual cues in mirror. Performed lateral weight shifts and partial knee bends in standing w/ Mod A to promote RLE NMR and R quad control. Seated rest breaks 2/2 fatigue, passive L hamstring stretch during rest breaks. Performed 4 steps at parallel bar w/ Max A and ambulated 7' at rail in hallway w/ Max A as well for RLE placement and swing limb advancement. Mod-Max facilitation of lateral weight shifting during gait. Instructed and performed w/c mobility using BLEs only to promote reciprocal movement pattern and voluntary RLE control, pt can perform this technique outside of therapy w/ supervision from wife or daughter for safety. Also educated pt and family on performing reciprocal and voluntary movement patterns outside of therapy while seated such as alternating ankle pumps, LAQs, and knee marches. Pt returned demonstration safely  and appropriately. Returned to room Total A in w/c. Ended session in w/c and in care of family, all needs met.   Therapy Documentation Precautions:  Precautions Precautions: Fall Precaution Comments: right sided weakness, right inattention Restrictions Weight Bearing Restrictions: No Vital Signs: Therapy Vitals Temp: 97.8 F (36.6 C) Temp Source: Oral Pulse Rate: 87 Resp: 18 BP: 129/71 Patient Position (if appropriate): Sitting Oxygen Therapy SpO2: 95 % O2 Device: Not Delivered  See Function Navigator for Current Functional Status.   Therapy/Group: Individual Therapy   K Arnette 09/25/2017, 5:16 PM  

## 2017-09-26 ENCOUNTER — Inpatient Hospital Stay (HOSPITAL_COMMUNITY): Payer: Medicare HMO | Admitting: Occupational Therapy

## 2017-09-26 ENCOUNTER — Other Ambulatory Visit: Payer: Self-pay

## 2017-09-26 NOTE — Progress Notes (Signed)
Patient ID: Jaime Pierce, male   DOB: Oct 23, 1946, 71 y.o.   MRN: [id number]   09/26/17.  Jaime Pierce is a 71 y.o. male admitted for CIR following a left parietal ICH with right hemiplegia.  This was felt secondary to hypertension and small vessel disease.  Past Medical History:  Diagnosis Date  . Coronary artery disease   . Dyspnea on exertion   . Hyperlipidemia   . Hypertension   . Tobacco abuse      Subjective: No new complaints. No new problems.  Had a very good night.  Wife is present at bedside  Objective: Vital signs in last 24 hours: Temp:  [97.8 F (36.6 C)-98.1 F (36.7 C)] 98.1 F (36.7 C) (11/04 0518) Pulse Rate:  [68-87] 68 (11/04 0518) Resp:  [18] 18 (11/04 0518) BP: (121-129)/(71-78) 121/78 (11/04 0518) SpO2:  [95 %-97 %] 97 % (11/04 0518) Weight change:  Last BM Date: 09/25/17  Intake/Output from previous day: 11/03 0701 - 11/04 0700 In: 440 [P.O.:440] Out: 325 [Urine:325]   Physical Exam General: No apparent distress   HEENT: Clear Lungs: Normal effort. Lungs clear to auscultation, no crackles or wheezes. Cardiovascular: Regular rate and rhythm, no edema Abdomen: S/NT/ND; BS(+) Musculoskeletal:  unchanged Neurological: No new neurological deficits with a dense right hemiparesis Wounds: N/A    Extremities.  SCDs in place;  without edema Mental state: Alert, oriented, cooperative    Lab Results: BMET    Component Value Date/Time   NA 136 09/23/2017 0929   K 3.8 09/23/2017 0929   CL 102 09/23/2017 0929   CO2 23 09/23/2017 0929   GLUCOSE 128 (H) 09/23/2017 0929   GLUCOSE 113 (H) 09/21/2006 0832   BUN 17 09/23/2017 0929   CREATININE 0.77 09/23/2017 0929   CALCIUM 9.1 09/23/2017 0929   GFRNONAA >60 09/23/2017 0929   GFRAA >60 09/23/2017 0929   CBC    Component Value Date/Time   WBC 8.4 09/23/2017 0929   RBC 4.77 09/23/2017 0929   HGB 15.2 09/23/2017 0929   HCT 43.9 09/23/2017 0929   PLT 405 (H) 09/23/2017 0929   MCV 92.0  09/23/2017 0929   MCH 31.9 09/23/2017 0929   MCHC 34.6 09/23/2017 0929   RDW 13.5 09/23/2017 0929   LYMPHSABS 1.4 09/09/2017 1932   MONOABS 1.9 (H) 09/09/2017 1932   EOSABS 0.4 09/09/2017 1932   BASOSABS 0.0 09/09/2017 1932    Medications: I have reviewed the patient's current medications.  Current Facility-Administered Medications  Medication Dose Route Frequency Provider Last Rate Last Dose  . acetaminophen (TYLENOL) tablet 650 mg  650 mg Oral Q4H PRN Angiulli, Mcarthur Rossettianiel J, PA-C      . albuterol (PROVENTIL) (2.5 MG/3ML) 0.083% nebulizer solution 3 mL  3 mL Inhalation Q4H PRN Angiulli, Mcarthur Rossettianiel J, PA-C      . baclofen (LIORESAL) tablet 20 mg  20 mg Oral TID Ranelle OysterSwartz, Zachary T, MD   20 mg at 09/26/17 0810  . feeding supplement (ENSURE ENLIVE) (ENSURE ENLIVE) liquid 237 mL  237 mL Oral BID BM Ranelle OysterSwartz, Zachary T, MD   237 mL at 09/25/17 1458  . hydrocortisone 1 % ointment   Topical TID PRN Angiulli, Mcarthur Rossettianiel J, PA-C      . metoprolol tartrate (LOPRESSOR) tablet 25 mg  25 mg Oral Daily Charlton Amorngiulli, Daniel J, PA-C   25 mg at 09/26/17 0810  . mometasone-formoterol (DULERA) 200-5 MCG/ACT inhaler 2 puff  2 puff Inhalation BID Angiulli, Mcarthur Rossettianiel J, PA-C   2 puff at  09/26/17 0802  . nitroGLYCERIN (NITROSTAT) SL tablet 0.4 mg  0.4 mg Sublingual Q5 min PRN Angiulli, Mcarthur Rossetti, PA-C      . nystatin (MYCOSTATIN/NYSTOP) topical powder   Topical TID Ranelle Oyster, MD      . ondansetron [facility]) tablet 4 mg  4 mg Oral Q6H PRN Charlton Amor, PA-C       Or  . ondansetron [facility]) injection 4 mg  4 mg Intravenous Q6H PRN Angiulli, Mcarthur Rossetti, PA-C      . pantoprazole (PROTONIX) EC tablet 40 mg  40 mg Oral Daily Charlton Amor, PA-C   40 mg at 09/25/17 2045  . senna-docusate (Senokot-S) tablet 1 tablet  1 tablet Oral QHS PRN Angiulli, Mcarthur Rossetti, PA-C      . sorbitol 70 % solution 30 mL  30 mL Oral Daily PRN Angiulli, Mcarthur Rossetti, PA-C      . vitamin B-12 (CYANOCOBALAMIN) tablet 1,000 mcg  1,000 mcg Oral Daily  Charlton Amor, PA-C   1,000 mcg at 09/26/17 1610    Assessment/Plan:  Status post left parietal intraparenchymal hemorrhage with right hemiparesis.  Continue CIR Hypertension.  Controlled with metoprolol Coronary artery disease.   Status post CABG.  Consider statin therapy    Length of stay, days: 17  Rogelia Boga , MD 09/26/2017, 8:23 AM

## 2017-09-26 NOTE — Progress Notes (Signed)
Occupational Therapy Session Note  Patient Details  Name: Jaime Pierce MRN: [id number] Date of Birth: Dec 22, 1945  Today's Date: 09/26/2017 OT Individual Time: 0705-0800 OT Individual Time Calculation (min): 55 min    Short Term Goals: Week 2:  OT Short Term Goal 1 (Week 2): Pt will stand with max A of 1 to prepare for LB dressing. OT Short Term Goal 2 (Week 2): Pt will transfer bed to chair with max A of 1 to prepare for a safe transfer to a BSC. OT Short Term Goal 3 (Week 2): Pt will demonstrate improved praxis to don shirt with min A. OT Short Term Goal 4 (Week 2): Pt will attend to R side by using L hand to move R arm across body to prepare for rolling in bed.   Skilled Therapeutic Interventions/Progress Updates:    Treatment session with focus on self-care retraining with midline orientation and attention to Rt side of body.  Pt completed bed mobility with min cues for technique.  Utilized Stedy for transfer bed > tub bench in room shower with max assist for sit <> stand.  Stedy assist for sitting balance during bathing.  Hand over hand assist for positioning of RUE to increase success with washing underarm.  Utilized lateral leans to wash buttocks with improved trunk control with weight shifting.  Dressing completed with education on hemi-dressing technique with pt requiring mod cues for sequencing when donning shirt.  Max assist sit > stand in [place] for support and trunk control while therapist pulled up pants.  Educated on use of RUE as gross assist to hold deodorant when opening cap.  Encouraged use of RUE as gross assist for NMR during self-feeding.  Therapy Documentation Precautions:  Precautions Precautions: Fall Precaution Comments: right sided weakness, right inattention Restrictions Weight Bearing Restrictions: No General:   Vital Signs: Therapy Vitals Temp: 98.1 F (36.7 C) Temp Source: Oral Pulse Rate: 68 Resp: 18 BP: 121/78 Patient Position (if appropriate):  Lying Oxygen Therapy SpO2: 97 % O2 Device: Nasal Cannula O2 Flow Rate (L/min): 2 L/min Pain:  Pt with no c/o pain  See Function Navigator for Current Functional Status.   Therapy/Group: Individual Therapy  Rosalio LoudHOXIE, Idy Rawling 09/26/2017, 8:02 AM

## 2017-09-27 ENCOUNTER — Inpatient Hospital Stay (HOSPITAL_COMMUNITY): Payer: Medicare HMO | Admitting: Occupational Therapy

## 2017-09-27 ENCOUNTER — Ambulatory Visit: Payer: Medicare HMO | Admitting: Internal Medicine

## 2017-09-27 ENCOUNTER — Inpatient Hospital Stay (HOSPITAL_COMMUNITY): Payer: Medicare HMO | Admitting: Physical Therapy

## 2017-09-27 ENCOUNTER — Inpatient Hospital Stay (HOSPITAL_COMMUNITY): Payer: Medicare HMO | Admitting: Speech Pathology

## 2017-09-27 MED ORDER — IPRATROPIUM BROMIDE 0.02 % IN SOLN
RESPIRATORY_TRACT | Status: AC
  Filled 2017-09-27: qty 2.5

## 2017-09-27 MED ORDER — ALBUTEROL SULFATE (2.5 MG/3ML) 0.083% IN NEBU
INHALATION_SOLUTION | RESPIRATORY_TRACT | Status: AC
  Filled 2017-09-27: qty 6

## 2017-09-27 NOTE — Progress Notes (Signed)
Occupational Therapy Session Note  Patient Details  Name: Jaime Pierce MRN: [id number] Date of Birth: 31-Aug-1946  Today's Date: 09/27/2017 OT Individual Time: 0700-0800 OT Individual Time Calculation (min): 60 min    Short Term Goals: Week 2:  OT Short Term Goal 1 (Week 2): Pt will stand with max A of 1 to prepare for LB dressing. OT Short Term Goal 2 (Week 2): Pt will transfer bed to chair with max A of 1 to prepare for a safe transfer to a BSC. OT Short Term Goal 3 (Week 2): Pt will demonstrate improved praxis to don shirt with min A. OT Short Term Goal 4 (Week 2): Pt will attend to R side by using L hand to move R arm across body to prepare for rolling in bed.   Skilled Therapeutic Interventions/Progress Updates:    Upon entering the room, pt supine in bed with wife present in room. Pt agreeable to OT intervention and with no c/o pain this session. Pt performed supine >sit with min A to EOB. Pt required assistance to thread pants onto B feet but in standing pt able to pull over B hips. Pt standing from bed with max A for lifting and lowering assistance. Pt performed stand pivot transfer with max A to L with increased time as well. Pt donning pull over shirt with focus on hemiplegic techniques with assistance for R UE and to pull down trunk. Pt propelled wheelchair with hemiplegic technique 100' to day room with supervision. Pt eating at table with set up A to open some containers. He was able to open syrup this session and needing min cues for cutting his own pancakes. Pt propelled self back to room at end of session in same manner. Call bell and all needed items within reach upon exiting the room.   Therapy Documentation Precautions:  Precautions Precautions: Fall Precaution Comments: right sided weakness, right inattention Restrictions Weight Bearing Restrictions: No General:   Vital Signs: Therapy Vitals Temp: 97.6 F (36.4 C) Temp Source: Oral Pulse Rate: 76 Resp:  18 BP: 119/75 Patient Position (if appropriate): Lying Oxygen Therapy SpO2: 94 % O2 Device: Not Delivered Pain:   ADL:   Vision   Perception    Praxis   Exercises:   Other Treatments:    See Function Navigator for Current Functional Status.   Therapy/Group: Individual Therapy  Alen BleacherBradsher, Rayme Bui P 09/27/2017, 4:59 PM

## 2017-09-27 NOTE — Progress Notes (Signed)
Speech Language Pathology Daily Session Notes  Patient Details  Name: Jaime Pierce MRN: [id number] Date of Birth: [birth year]  Today's Date: 09/27/2017  Session 1: SLP Individual Time: [id number] SLP Individual Time Calculation (min): 30 min   Session 2: SLP Individual Time: 1300-1400 SLP Individual Time Calculation (min): 60 min  Short Term Goals: Week 3: SLP Short Term Goal 1 (Week 3): Patient will recall new, daily information with Min A multimodal cues.  SLP Short Term Goal 2 (Week 3): Patient will demonstrate functional problem solving for basic and familiar tasks with Min A verbal cues.  SLP Short Term Goal 3 (Week 3): Patient will follow 2 step directions with 75% accuracy and Min A verbal cues.  SLP Short Term Goal 4 (Week 3): Patient will demonstrate selective attention in a mildly distracting enviornment  to functional tasks for ~60 minutes with Mod I.  SLP Short Term Goal 5 (Week 3): Patient will verbally express mildly abstract thoughts at the phrase and sentence level with superivsion verbal cues.  SLP Short Term Goal 6 (Week 3): Patient will demonstrate reading comprehension at the phrase level with Min A verbal cues.   Skilled Therapeutic Interventions:  Session 1: Skilled treatment session focused on cognitive-linguistic goals. SLP facilitated session by providing extra time to complete a generative naming task and Min A verbal cues to self-monitor and correct spelling errors and perseveration during written expression at the word level with his non-dominant LUE. Patient left upright in bed with all needs within reach. Continue with current plan of care.   Session 2: Skilled treatment session focused on cognitive-linguistic goals. Upon arrival, patient was awake while supine in bed. Patient [facility] Min-Mod A verbal cues for problem solving with task. Patient also participated in a novel card task that he learned during previous  SLP session. Patient required Mod-Max A verbal cues for recall of procures and to recall associations between symbols and categories. However, patient was Mod I for word-finding throughout session. Patient left upright in wheelchair with quick release belt in place and all needs within reach. Continue with current plan of care.     Function:  Cognition Comprehension Comprehension assist level: Understands basic 75 - 89% of the time/ requires cueing 10 - 24% of the time  Expression   Expression assist level: Expresses basic 75 - 89% of the time/requires cueing 10 - 24% of the time. Needs helper to occlude trach/needs to repeat words.  Social Interaction Social Interaction assist level: Interacts appropriately 75 - 89% of the time - Needs redirection for appropriate language or to initiate interaction.  Problem Solving Problem solving assist level: Solves basic 50 - 74% of the time/requires cueing 25 - 49% of the time  Memory Memory assist level: Recognizes or recalls 75 - 89% of the time/requires cueing 10 - 24% of the time    Pain No/Denies Pain   Therapy/Group: Individual Therapy  Anevay Campanella 09/27/2017, 12:34 PM

## 2017-09-27 NOTE — Progress Notes (Signed)
Physical Therapy Session Note  Patient Details  Name: Jaime FuelRichard A Deetz MRN: [id number] Date of Birth: 1946-09-26  Today's Date: 09/27/2017 PT Individual Time: 0845-1000 PT Individual Time Calculation (min): 75 min   Short Term Goals: Week 3:  PT Short Term Goal 1 (Week 3): Pt will demonstrate transfers w/c <>bed with minA PT Short Term Goal 2 (Week 3): Pt will demonstrate bed mobility with minA from flat bed PT Short Term Goal 3 (Week 3): Pt will ambulate x20' with modA PT Short Term Goal 4 (Week 3): Pt will demonstrate sit <>stand with consistent modA  Skilled Therapeutic Interventions/Progress Updates: Pt received seated in w/c at sink finishing oral hygiene. Transported to gym totalA for time management. Squat pivot transfer w/c >mat towards R side with modA, max multimodal cueing for technique and sequencing. Lumbopelvic dissociation with visual feedback from mirror, tactile cueing for trunk shortening to elevate hip while maintaining neutral shoulder alignment. Attempts to transition hip shifting into scooting forward/back with little success d/t increased cognitive demand, apraxia. Sit <>stand with L lateral reach to retrieve horseshoes and facilitate midline alignment, desensitization to L weight shifting. Gait x2 trials of 5515' with modA, improved RLE progression and stance control, occasional assist to reduce RLE scissoring. Max multimodal cueing for L weight shifting, reducing pushing in LUE. W/c propulsion to return to room with L hemi technique after pt unsuccessful with BLEs. Squat pivot transfer to R side to return to bed modA. Sit >supine minA for RLE management d/t apraxia and pushing with R extremities. BLE PNF D2 flexion/extension patterns with rhythmic initiation and progression to mild resistance provided through both patterns. Remained supine in bed at end of session, all needs in reach and wife present.      Therapy Documentation Precautions:  Precautions Precautions:  Fall Precaution Comments: right sided weakness, right inattention Restrictions Weight Bearing Restrictions: No   See Function Navigator for Current Functional Status.   Therapy/Group: Individual Therapy  [name] 09/27/2017, 10:35 AM

## 2017-09-27 NOTE — Progress Notes (Signed)
[place] PHYSICAL MEDICINE & REHABILITATION     PROGRESS NOTE   Subjective/Complaints: No major issues. Oxygen applied for "decreased" o2 sat (94%). Pt up in chair. No complaints.   ROS: pt denies nausea, vomiting, diarrhea, cough, shortness of breath or chest pain   Objective: Vital Signs: Blood pressure 128/75, pulse 74, temperature 97.7 F (36.5 C), temperature source Oral, resp. rate 18, height 6\' 1"  (1.854 m), weight 83.5 kg (184 lb 1.6 oz), SpO2 95 %. No results found. No results for input(s): WBC, HGB, HCT, PLT in the last 72 hours. No results for input(s): NA, K, CL, GLUCOSE, BUN, CREATININE, CALCIUM in the last 72 hours.  Invalid input(s): CO CBG (last 3)  No results for input(s): GLUCAP in the last 72 hours.  Wt Readings from Last 3 Encounters:  09/23/17 83.5 kg (184 lb 1.6 oz)  09/01/17 85.1 kg (187 lb 9.8 oz)  06/30/17 83 kg (183 lb)    Physical Exam:   Constitutional: He appears well-developed. NAD. HENT: Normocephalic. Atraumatic. Eyes: EOMare normal. No discharge. Cardiovascular: RRR without murmur. No JVD          Respiratory: CTA Bilaterally without wheezes or rales. Normal effort           GI: BS +, non-tender, non-distended .  Skin.warm and dry. Rash nearly resolved Neurological: He is alertand oriented.  Much improved language/apraxia Motor: LUE/LLE: 5/5 proximal to distal  RUE: tr/5 proximal. Squeezed my handd 2/5![name] Kitchen  RLE: 3/5 HF, 3/5 KE and ankle PF/DF.   1-2/4  Tone biceps, pecs RUE. RLE--tr-1/4 extensor tone LLE-improved automatic movement Psych: affect more dynamic  Assessment/Plan: 1. 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 Physiatrist is providing close team supervision and 24 hour management of active medical problems listed below. Physiatrist and rehab team continue to assess barriers to  discharge/monitor patient progress toward functional and medical goals.  Function:  Bathing Bathing position   Position: Shower  Bathing parts Body parts bathed by patient: Chest, Abdomen, Front perineal area, Right upper leg, Left upper leg, Buttocks Body parts bathed by helper: Right arm, Left arm, Right lower leg, Left lower leg, Back  Bathing assist Assist Level: (Mod assist)      Upper Body Dressing/Undressing Upper body dressing   What is the patient wearing?: Pull over shirt/dress     Pull over shirt/dress - Perfomed by patient: Thread/unthread left sleeve, Put head through opening Pull over shirt/dress - Perfomed by helper: Thread/unthread right sleeve, Pull shirt over trunk        Upper body assist Assist Level: (Mod assist)      Lower Body Dressing/Undressing Lower body dressing   What is the patient wearing?: Pants, Socks, Shoes   Underwear - Performed by helper: Thread/unthread right underwear leg, Thread/unthread left underwear leg, Pull underwear up/down   Pants- Performed by helper: Thread/unthread right pants leg, Thread/unthread left pants leg, Pull pants up/down   Non-skid slipper socks- Performed by helper: Don/doff right sock, Don/doff left sock   Socks - Performed by helper: Don/doff right sock, Don/doff left sock   Shoes - Performed by helper: Don/doff right shoe, Don/doff left shoe, Fasten right, Fasten left          Lower body assist Assist for lower body dressing: 2 Designer, multimedia activity did not occur: Safety/medical concerns   Toileting steps completed by  helper: Performs perineal hygiene Toileting Assistive Devices: Grab bar or rail  Toileting assist Assist level: Touching or steadying assistance (Pt.75%)   Transfers Chair/bed transfer   Chair/bed transfer method: Lateral scoot Chair/bed transfer assist level: Moderate assist (Pt 50 - 74%/lift or lower) Chair/bed transfer assistive device:  Armrests Mechanical lift: Stedy   Locomotion Ambulation Ambulation activity did not occur: Safety/medical concerns   Max distance: 7' Assist level: Maximal assist (Pt 25 - 49%)   Wheelchair   Type: Manual Max wheelchair distance: 25 Assist Level: Moderate assistance (Pt 50 - 74%)  Cognition Comprehension Comprehension assist level: Understands basic 75 - 89% of the time/ requires cueing 10 - 24% of the time  Expression Expression assist level: Expresses basic 75 - 89% of the time/requires cueing 10 - 24% of the time. Needs helper to occlude trach/needs to repeat words.  Social Interaction Social Interaction assist level: Interacts appropriately 75 - 89% of the time - Needs redirection for appropriate language or to initiate interaction.  Problem Solving Problem solving assist level: Solves basic 50 - 74% of the time/requires cueing 25 - 49% of the time  Memory Memory assist level: Recognizes or recalls 75 - 89% of the time/requires cueing 10 - 24% of the time   Medical Problem List and Plan: 1. 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 Nice motor and linguistic gains   2. DVT Prophylaxis/Anticoagulation: SCDs. Monitor for any signs of DVT  -Dopplers negative for DVT 3. Pain Management/spasticity:   -tolerating baclofen-titrated to 20mg  TID=tone is responding   -PRAFO RLE  -ROM with therapy, family  -no WHO indicated yet RUE  -suspect he will be a botox candidate, discussed with him and wife 4. Mood: Provide emotional support 5. Neuropsych: This patient iscapable of making decisions on hisown behalf. 6. Skin/Wound Care: rash resolved  -continuenystatin powder-     7. Fluids/Electrolytes/Nutrition:  I personally reviewed the patient's labs today.   8.CAD with CABG.No chest pain or shortness of breath 9. Hypertension. Lopressor 25 mg daily   Controlled on  11/5 10.COPD/tobacco abuse. Counseling.Continue inhalers  -off oxygen 11. Hyponatremia:  -likely central  Sodium 132: labs pending for today  12. Mild leukocytosis:     -wbc's down to 8.4          LOS (Days) 18 A FACE TO FACE EVALUATION WAS PERFORMED  Ranelle OysterSWARTZ,Lavine Hargrove T, MD 09/27/2017 8:45 AM

## 2017-09-27 NOTE — Progress Notes (Signed)
Wife concerned that pt was SOB while sleeping. Pt's Spo2 at 94 % on Room air. Elevated HOB and applied Nasal Canula at 1 L/min. Patient says that he is feeling okay but wife insist for comfort measures and for precautions. Patient agreed to wear Stanley at 1 L/min No signs of respiratory distress. Even/Unlabored respirations. Chest Symmetrical. Skin color appropriate for ethnicity. Will Continue to monitor.

## 2017-09-28 ENCOUNTER — Inpatient Hospital Stay (HOSPITAL_COMMUNITY): Payer: Medicare HMO | Admitting: Speech Pathology

## 2017-09-28 ENCOUNTER — Inpatient Hospital Stay (HOSPITAL_COMMUNITY): Payer: Medicare HMO | Admitting: *Deleted

## 2017-09-28 ENCOUNTER — Inpatient Hospital Stay (HOSPITAL_COMMUNITY): Payer: Medicare HMO | Admitting: Physical Therapy

## 2017-09-28 ENCOUNTER — Inpatient Hospital Stay (HOSPITAL_COMMUNITY): Payer: Medicare HMO | Admitting: Occupational Therapy

## 2017-09-28 ENCOUNTER — Other Ambulatory Visit: Payer: Self-pay

## 2017-09-28 NOTE — Progress Notes (Signed)
Physical Therapy Session Note  Patient Details  Name: [name] MRN: [id number] Date of Birth: Jun 11, 1946  Today's Date: 09/28/2017 PT Individual Time: 1001-1030 PT Individual Time Calculation (min): 29 min   Short Term Goals: Week 3:  PT Short Term Goal 1 (Week 3): Pt will demonstrate transfers w/c <>bed with minA PT Short Term Goal 2 (Week 3): Pt will demonstrate bed mobility with minA from flat bed PT Short Term Goal 3 (Week 3): Pt will ambulate x20' with modA PT Short Term Goal 4 (Week 3): Pt will demonstrate sit <>stand with consistent modA  Skilled Therapeutic Interventions/Progress Updates:   Pt in w/c upon arrival and agreeable to therapy, no c/o pain. Worked on [employer] and LE strengthening this session. Total A w/c transport to/from day room and transferred to/from NuStep w/ Max A stand pivot transfer. Performed NuStep 15 min @ L1 for facilitation of reciprocal movement pattern and LE strengthening. Tactile and manual cues for technique. Returned to room in w/c and ended session in w/c and in care of wife, all needs met.   Therapy Documentation Precautions:  Precautions Precautions: Fall Precaution Comments: right sided weakness, right inattention Restrictions Weight Bearing Restrictions: No Vital Signs: Oxygen Therapy SpO2: 96 % O2 Device: Not Delivered Pain: Pain Assessment Pain Assessment: No/denies pain  See Function Navigator for Current Functional Status.   Therapy/Group: Individual Therapy  Laster Appling K Arnette 09/28/2017, 10:46 AM

## 2017-09-28 NOTE — Plan of Care (Signed)
Pt's rash improving faded pale pink/red

## 2017-09-28 NOTE — Progress Notes (Signed)
[facility] PHYSICAL MEDICINE & REHABILITATION     PROGRESS NOTE   Subjective/Complaints: Uneventful night.  No new complaints.  Still some tightness in the right shoulder.  ROS: pt denies nausea, vomiting, diarrhea, cough, shortness of breath or chest pain    Objective: Vital Signs: Blood pressure 129/74, pulse 73, temperature 97.8 F (36.6 C), temperature source Oral, resp. rate 16, height 6\' 1"  (1.854 m), weight 83.5 kg (184 lb 1.6 oz), SpO2 95 %. No results found. No results for input(s): WBC, HGB, HCT, PLT in the last 72 hours. No results for input(s): NA, K, CL, GLUCOSE, BUN, CREATININE, CALCIUM in the last 72 hours.  Invalid input(s): CO CBG (last 3)  No results for input(s): GLUCAP in the last 72 hours.  Wt Readings from Last 3 Encounters:  09/23/17 83.5 kg (184 lb 1.6 oz)  09/01/17 85.1 kg (187 lb 9.8 oz)  06/30/17 83 kg (183 lb)    Physical Exam:   Constitutional: He appears well-developed. NAD. HENT: Normocephalic. Atraumatic. Eyes: EOMare normal. No discharge. Cardiovascular: RRR without murmur. No JVD  Respiratory: CTA Bilaterally without wheezes or rales. Normal effort t           GI: BS +, non-tender, non-distended .  Skin.warm and dry. Rash nearly resolved Neurological: He is alertand oriented.  Much improved language/apraxia Motor: LUE/LLE: 5/5 proximal to distal  RUE: Trace to 1 out of 5 proximal to 2+ out of 5 in the right hand..  RLE: 3/5 HF, 3/5 KE and ankle PF/DF.   1-2/4  Tone biceps, pecs RUE. RLE--tr-1/4 extensor tone--tone stable LLE-moving more freely Psych: affect more dynamic.  Initiates conversation now  Assessment/Plan: 1. 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 Physiatrist is providing close team supervision and 24 hour management of active medical problems listed below. Physiatrist and rehab team  continue to assess barriers to discharge/monitor patient progress toward functional and medical goals.  Function:  Bathing Bathing position   Position: Shower  Bathing parts Body parts bathed by patient: Chest, Abdomen, Front perineal area, Right upper leg, Left upper leg, Buttocks Body parts bathed by helper: Right arm, Left arm, Right lower leg, Left lower leg, Back  Bathing assist Assist Level: (Mod assist)      Upper Body Dressing/Undressing Upper body dressing   What is the patient wearing?: Pull over shirt/dress     Pull over shirt/dress - Perfomed by patient: Thread/unthread left sleeve, Put head through opening Pull over shirt/dress - Perfomed by helper: Thread/unthread right sleeve, Pull shirt over trunk        Upper body assist Assist Level: (mod a)      Lower Body Dressing/Undressing Lower body dressing   What is the patient wearing?: Pants, Socks, Shoes   Underwear - Performed by helper: Thread/unthread right underwear leg, Thread/unthread left underwear leg, Pull underwear up/down Pants- Performed by patient: Pull pants up/down Pants- Performed by helper: Thread/unthread right pants leg, Thread/unthread left pants leg   Non-skid slipper socks- Performed by helper: Don/doff right sock, Don/doff left sock   Socks - Performed by helper: Don/doff right sock, Don/doff left sock   Shoes - Performed by helper: Don/doff right shoe, Don/doff left shoe, Fasten right, Fasten left          Lower body assist Assist for lower body dressing: (total)      Toileting Toileting Toileting activity did not occur: Safety/medical  concerns   Toileting steps completed by helper: Performs perineal hygiene Toileting Assistive Devices: Grab bar or rail  Toileting assist Assist level: Touching or steadying assistance (Pt.75%)   Transfers Chair/bed transfer   Chair/bed transfer method: Stand pivot Chair/bed transfer assist level: Maximal assist (Pt 25 - 49%/lift and  lower) Chair/bed transfer assistive device: Armrests Mechanical lift: Stedy   Locomotion Ambulation Ambulation activity did not occur: Safety/medical concerns   Max distance: 15 Assist level: Maximal assist (Pt 25 - 49%)   Wheelchair   Type: Manual Max wheelchair distance: 25 Assist Level: Moderate assistance (Pt 50 - 74%)  Cognition Comprehension Comprehension assist level: Understands basic 75 - 89% of the time/ requires cueing 10 - 24% of the time  Expression Expression assist level: Expresses basic 75 - 89% of the time/requires cueing 10 - 24% of the time. Needs helper to occlude trach/needs to repeat words.  Social Interaction Social Interaction assist level: Interacts appropriately 75 - 89% of the time - Needs redirection for appropriate language or to initiate interaction.  Problem Solving Problem solving assist level: Solves basic 50 - 74% of the time/requires cueing 25 - 49% of the time  Memory Memory assist level: Recognizes or recalls 75 - 89% of the time/requires cueing 10 - 24% of the time   Medical Problem List and Plan: 1. 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 Nice motor and linguistic gains   2. DVT Prophylaxis/Anticoagulation: SCDs. Monitor for any signs of DVT  -Dopplers negative for DVT 3. Pain Management/spasticity:   - Continue baclofen 20 mg 3 times daily   -PRAFO RLE  -ROM with therapy, family  -no WHO indicated yet RUE  -suspect he will be a botox candidate for RUE as outpt 4. Mood: Provide emotional support 5. Neuropsych: This patient iscapable of making decisions on hisown behalf. 6. Skin/Wound Care: rash resolved  -continuenystatin powder-     7. Fluids/Electrolytes/Nutrition:   Vigorous appetite 8.CAD with CABG.No chest pain or shortness of breath 9. Hypertension. Lopressor 25 mg daily   Controlled on 11/5 10.COPD/tobacco abuse.  Counseling.Continue inhalers  -off oxygen 11. Hyponatremia:  -likely central  Sodium 136 on 11/1  12. Mild leukocytosis:     -wbc's down to 8.4 most recently         LOS (Days) 19 A FACE TO FACE EVALUATION WAS PERFORMED  Ranelle OysterSWARTZ,Jaime Pierce T, MD 09/28/2017 8:48 AM

## 2017-09-28 NOTE — Progress Notes (Signed)
Speech Language Pathology Daily Session Note  Patient Details  Name: Jaime Pierce MRN: [id number] Date of Birth: [birth year]  Today's Date: 09/28/2017 SLP Individual Time: 0830-0930 SLP Individual Time Calculation (min): 60 min  Short Term Goals: Week 3: SLP Short Term Goal 1 (Week 3): Patient will recall new, daily information with Min A multimodal cues.  SLP Short Term Goal 2 (Week 3): Patient will demonstrate functional problem solving for basic and familiar tasks with Min A verbal cues.  SLP Short Term Goal 3 (Week 3): Patient will follow 2 step directions with 75% accuracy and Min A verbal cues.  SLP Short Term Goal 4 (Week 3): Patient will demonstrate selective attention in a mildly distracting enviornment  to functional tasks for ~60 minutes with Mod I.  SLP Short Term Goal 5 (Week 3): Patient will verbally express mildly abstract thoughts at the phrase and sentence level with superivsion verbal cues.  SLP Short Term Goal 6 (Week 3): Patient will demonstrate reading comprehension at the phrase level with Min A verbal cues.   Skilled Therapeutic Interventions: Skilled treatment session focused on cognitive-linguistic goals. Patient demonstrated reading comprehension at the phrase and sentence level with Mod I and required supervision verbal cues for reading comprehension at the paragraph level. A mild right inattention was noted while decoding at the paragraph level. Patient left upright in wheelchair with all needs within reach. Continue with current plan of care.      Function:   Cognition Comprehension Comprehension assist level: Understands basic 75 - 89% of the time/ requires cueing 10 - 24% of the time  Expression   Expression assist level: Expresses basic 75 - 89% of the time/requires cueing 10 - 24% of the time. Needs helper to occlude trach/needs to repeat words.  Social Interaction Social Interaction assist level: Interacts appropriately 75 - 89% of the time - Needs  redirection for appropriate language or to initiate interaction.  Problem Solving Problem solving assist level: Solves basic 50 - 74% of the time/requires cueing 25 - 49% of the time  Memory Memory assist level: Recognizes or recalls 75 - 89% of the time/requires cueing 10 - 24% of the time    Pain No/Denies Pain   Therapy/Group: Individual Therapy  Abbigaile Rockman 09/28/2017, 12:36 PM

## 2017-09-28 NOTE — Patient Care Conference (Signed)
Inpatient RehabilitationTeam Conference and Plan of Care Update Date: 09/28/2017   Time: 2:30 PM    Patient Name: Jaime Pierce      Medical Record Number: [id number]  Date of Birth: Mar 28, 1946 Sex: Male         Room/Bed: 4W08C/4W08C-01 Payor Info: Payor: [employer] MEDICARE / Plan: [employer] MEDICARE HMO/PPO / Product Type: *No Product type* /    Admitting Diagnosis: Stroke  Admit Date/Time:  09/09/2017  6:52 PM Admission Comments: No comment available   Primary Diagnosis:  Intraparenchymal hemorrhage of brain (HCC) Principal Problem: Intraparenchymal hemorrhage of brain [facility](HCC)  Patient Active Problem List   Diagnosis Date Noted  . Hyponatremia   . Benign essential HTN   . Intraparenchymal hemorrhage of brain (HCC) 09/09/2017  . 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 Gait disturbance, post-stroke   . Aphasia as late effect of stroke   . SAH (subarachnoid hemorrhage) (HCC) 09/02/2017  . B12 deficiency 09/02/2017  . Well adult exam 06/16/2016  . Cigarette smoker 02/23/2016  . COPD GOLD II  12/24/2014  . CAD (coronary artery disease) 07/22/2011  . HTN (hypertension) 07/22/2011  . Dyslipidemia 07/22/2011  . Smoker 07/22/2011    Expected Discharge Date: Expected Discharge Date: 10/08/17  Team Members Present: Physician leading conference: Dr. Faith RogueZachary Swartz Social Worker Present: Dossie DerBecky Kaysen Deal, LCSW Nurse Present: Kennon PortelaJeanna Hicks, RN PT Present: Karolee StampsAlison Gray, PT OT Present: Kearney HardElisabeth Doe, OT SLP Present: Feliberto Gottronourtney Payne, SLP PPS Coordinator present : Tora DuckMarie Noel, RN, CRRN     Current Status/Progress Goal Weekly Team Focus  Medical   Patient's right-sided tone continues to improve.  Language is more spontaneous.  Ongoing improvement of use of right arm and leg.  Tone control, nutrition   Bowel/Bladder   continent of bowel and bladder. Uses urinal with assist to empty. Last BM 09/27/2017.  Remain continent of bowel and  bladder.  Monitor for bowel and bladder function.    Swallow/Nutrition/ Hydration             ADL's   decreased pushing and increased trunk control, stand pivot/squat pivot max A to L and total A to R, mod A UB self care, toileting total A, LB total A.   S/set up for sitting balance,UB self care, and grooming, mod A toileting, min A all other goals (goals may need to be downgraded by next week)  R NME, functional transfers, self care training, pt/family education, cognition   Mobility   minA bed mobility, mod/maxA transfers and gait with L rail; significant apraxia and pusher syndrome, hypertonicity RLE improving  min assist w/c level transfers; supervision w/c propulsion, mod car transfer, mod assist gait x 30'  neuro re-ed, postural alignment and midline orientation, motor planning, attention, functional mobility   Communication   Supervision-Min A  Supervision   comprehesion of complex commands    Safety/Cognition/ Behavioral Observations  Min-Mod A  Min A  problem solving and recall    Pain   Denies pain this shift.  Pain rated less than 2.  Assess for pain q shift prn.   Skin   Rash to buttocks and groin area. Improving. Using Nystatin powder scheduled.  No skin breakdown while in rehab, current rash to resolve by discharge.  Assess skin q shift and prn.      *See Care Plan and progress notes for long and short-term goals.     Barriers to Discharge  Current Status/Progress Possible Resolutions Date Resolved  Physician    Medical stability        Continue current medical plan      Nursing                  PT                    OT                  SLP                SW                Discharge Planning/Teaching Needs:  Plan to DC home with wife who can provide 24 hr care.       Team Discussion:  Continuing to make progress toward his min/mod level wheelchair level goals. Decreasing pushing and tone better. Increased trunk control. Wife here daily and participating  when able. Discussed ramp into home for the two steps they have. Medically adjusting tone medications.   Revisions to Treatment Plan:  DC 11/16    Continued Need for Acute Rehabilitation Level of Care: The patient requires daily medical management by a physician with specialized training in physical medicine and rehabilitation for the following conditions: Daily direction of a multidisciplinary physical rehabilitation program to ensure safe treatment while eliciting the highest outcome that is of practical value to the patient.: Yes Daily medical management of patient stability for increased activity during participation in an intensive rehabilitation regime.: Yes Daily analysis of laboratory values and/or radiology reports with any subsequent need for medication adjustment of medical intervention for : Neurological problems  [name], [name] 09/28/2017, 3:01 PM

## 2017-09-28 NOTE — Progress Notes (Signed)
Occupational Therapy Session Note  Patient Details  Name: Jaime Pierce MRN: [id number] Date of Birth: [birth year]  Today's Date: 09/28/2017 OT Individual Time: 1302-1400 OT Individual Time Calculation (min): 58 min    Short Term Goals: Week 2:  OT Short Term Goal 1 (Week 2): Pt will stand with max A of 1 to prepare for LB dressing. OT Short Term Goal 2 (Week 2): Pt will transfer bed to chair with max A of 1 to prepare for a safe transfer to a BSC. OT Short Term Goal 3 (Week 2): Pt will demonstrate improved praxis to don shirt with min A. OT Short Term Goal 4 (Week 2): Pt will attend to R side by using L hand to move R arm across body to prepare for rolling in bed.   Skilled Therapeutic Interventions/Progress Updates:    Pt seated in w/c upon OT arrival for NMR session this PM.  Per wife request, education provided on PROM techniques for RUE, including shoulder positioning for optimal motion and to decrease risk of impingement as well as wife's hand positioning.  OT demonstrated and wife replicated with proper body mechanics.  Pt presents with hypertonicity and increased pain around 90* shoulder flexion and 10* ER.  Additional education provided on neurologic recovery timeframes.  OT propelled pt to gym and pt completed 2 standing trials at standing frame, initial trial x 9 mins and second trial x 1 minute d/t increased pushing towards R.  Max A to stand with max multimodal cues for positioning and technique.  While standing, pt participated in targeted reaching activities while weightbearing through RUE for improved awareness and facilitation of shoulder activation/stabilization.  Pt assisted back to room and remained seated in w/c with wife present and all needs met upon OT departure.  Therapy Documentation Precautions:  Precautions Precautions: Fall Precaution Comments: right sided weakness, right inattention Restrictions Weight Bearing Restrictions: No Pain: Pain Assessment Pain  Assessment: No/denies pain  See Function Navigator for Current Functional Status.   Therapy/Group: Individual Therapy  Marcella Dubs 09/28/2017, 2:41 PM

## 2017-09-28 NOTE — Progress Notes (Signed)
Social Work Patient ID: Jaime Pierce, male   DOB: [birth year], 71 y.o.   MRN: [id number]  Met with pt, wife and friend who were here for team conference update. Pt would like to be making progress more quickly both he and wife are grateful he is making progress but pt wishes it was quicker. Wife is realizing how much care pt requires and feels he will need to make a lot more progress for her to be able to manage him at home. Discussed building a ramp into the home for the two steps. She will talk with PT regarding this. Aware Lorre Nick will return next Monday. If questions arise will let this worker know.

## 2017-09-28 NOTE — Progress Notes (Signed)
Physical Therapy Session Note  Patient Details  Name: Jaime Pierce MRN: [id number] Date of Birth: 12/17/1945  Today's Date: 09/28/2017 PT Individual Time: 1105-1200 PT Individual Time Calculation (min): 55 min   Short Term Goals: Week 3:  PT Short Term Goal 1 (Week 3): Pt will demonstrate transfers w/c <>bed with minA PT Short Term Goal 2 (Week 3): Pt will demonstrate bed mobility with minA from flat bed PT Short Term Goal 3 (Week 3): Pt will ambulate x20' with modA PT Short Term Goal 4 (Week 3): Pt will demonstrate sit <>stand with consistent modA  Skilled Therapeutic Interventions/Progress Updates:  Pt presented in w/c agreeable to therapy. Transported to rehab gym total assist for time management and energy conservation. Initiated gait training at wall. Pt ambulated x 10 ft maxA with max multimodal cues for wt shift to R, R TKE in stance phase (with PTA blocking knee), and maintaining erect trunk for postural control. Pt with delayed advancement of LLE with poor control of RLE. Pt agreeable to trial Lite Gait. Pt required maxA sit to stand with set up and max multimodal cues for erect posture. Pt able to ambulate approx 4710ft with [employer] maxA with increased pushing noted with max multimodal cues for achieving midline. Also performed static stand at wall rail with mirror feedback, performed wt shifting to R while performing L hip flexion x 5, cues for posture and sequencing. Pt transported back to room at end of session and remained in w/c with call button within reach and wife present.      Therapy Documentation Precautions:  Precautions Precautions: Fall Precaution Comments: right sided weakness, right inattention Restrictions Weight Bearing Restrictions: No General:   Vital Signs: Oxygen Therapy SpO2: 96 % O2 Device: Not Delivered   See Function Navigator for Current Functional Status.   Therapy/Group: Individual Therapy  [name]  Carlette Palmatier,  PTA  09/28/2017, 12:16 PM

## 2017-09-29 ENCOUNTER — Inpatient Hospital Stay (HOSPITAL_COMMUNITY): Payer: Medicare HMO | Admitting: Physical Therapy

## 2017-09-29 ENCOUNTER — Inpatient Hospital Stay (HOSPITAL_COMMUNITY): Payer: Medicare HMO | Admitting: Occupational Therapy

## 2017-09-29 ENCOUNTER — Other Ambulatory Visit: Payer: Self-pay

## 2017-09-29 ENCOUNTER — Inpatient Hospital Stay (HOSPITAL_COMMUNITY): Payer: Medicare HMO | Admitting: Speech Pathology

## 2017-09-29 NOTE — Progress Notes (Signed)
Nutrition Follow-up  DOCUMENTATION CODES:   Not applicable  INTERVENTION:   - Continue Ensure Enlive po BID, each supplement provides 350 kcal and 20 grams of protein  - Encourage adequate PO intake  NUTRITION DIAGNOSIS:   Inadequate oral intake related to poor appetite as evidenced by per patient/family report. Improving  GOAL:   Patient will meet greater than or equal to 90% of their needs Met  MONITOR:   PO intake, Supplement acceptance, Labs, Weight trends, Skin, I & O's     ASSESSMENT:   71 y.o.right handed male with history of CAD with CABG, hypertension, tobacco abuse. Presented 09/01/2017 with acute onset of right sided weakness and facial droop. CT/MRI showed a 4.2 x 2.5 x 3.1 cm left parietal lobe intraparenchymal hematoma. Small-volume acute subarachnoid hemorrhage was in the adjacent left parietal lobe as well as at the anterior right frontal lobe.   Pt reports a good appetite and eating most of his food. Meal completion has varied from 30-100% with most meals >/= 60%. Weight has been stable. Ensure has been ordered BID and pt reports consuming them.  Medications and labs reviewed. Will continue to monitor.  Diet Order:  Diet Heart Room service appropriate? Yes; Fluid consistency: Thin  EDUCATION NEEDS:   No education needs identified at this time  Skin:  Skin Assessment: Reviewed RN Assessment  Last BM:  11/5  Height:   Ht Readings from Last 1 Encounters:  09/09/17 _0  (1.854 m)    Weight:   Wt Readings from Last 1 Encounters:  09/23/17 184 lb 1.6 oz (83.5 kg)    Ideal Body Weight:  83.6 kg  BMI:  Body mass index is 24.29 kg/m.  Estimated Nutritional Needs:   Kcal:  2000-2200  Protein:  90-100 grams  Fluid:  2-2.2 L/day    [place] Dietetic Intern Pager: [phone] 09/29/2017 12:32 PM

## 2017-09-29 NOTE — Progress Notes (Signed)
[facility]IC[facility] & REHABILITATION     PROGRESS NOTE   Subjective/Complaints: Had another good night. No new issues  ROS: pt denies nausea, vomiting, diarrhea, cough, shortness of breath or chest pain    Objective: Vital Signs: Blood pressure 125/68, pulse 75, temperature 97.8 F (36.6 C), temperature source Oral, resp. rate 18, height 6\' 1"  (1.854 m), weight 83.5 kg (184 lb 1.6 oz), SpO2 93 %. No results found. No results for input(s): WBC, HGB, HCT, PLT in the last 72 hours. No results for input(s): NA, K, CL, GLUCOSE, BUN, CREATININE, CALCIUM in the last 72 hours.  Invalid input(s): CO CBG (last 3)  No results for input(s): GLUCAP in the last 72 hours.  Wt Readings from Last 3 Encounters:  09/23/17 83.5 kg (184 lb 1.6 oz)  09/01/17 85.1 kg (187 lb 9.8 oz)  06/30/17 83 kg (183 lb)    Physical Exam:   Constitutional: He appears well-developed. NAD. HENT: Normocephalic. Atraumatic. Eyes: EOMare normal. No discharge. Cardiovascular: RRR without murmur. No JVD   Respiratory:CTA Bilaterally without wheezes or rales. Normal effort           GI: BS +, non-tender, non-distended .  Skin.warm and dry. Rash has resolved on back Neurological: He is alertand oriented.  Much improved language/more automatic Motor: LUE/LLE: 5/5 proximal to distal  RUE: Trace to 1 out of 5 proximal to 2+ out of 5 in the right hand..  RLE: 3/5 HF, 3/5 KE and ankle PF/DF.   1-2/4  Tone biceps, pecs RUE. RLE--tr-1/4 extensor tone--tone stable to improved LLE-moving more freely Psych: affect more dynamic  Assessment/Plan: 1. 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 Physiatrist is providing close team supervision and 24 hour management of active medical problems listed below. Physiatrist and rehab team continue to assess barriers to discharge/monitor  patient progress toward functional and medical goals.  Function:  Bathing Bathing position   Position: Shower  Bathing parts Body parts bathed by patient: Chest, Abdomen, Front perineal area, Right upper leg, Left upper leg, Buttocks Body parts bathed by helper: Right arm, Left arm, Right lower leg, Left lower leg, Back  Bathing assist Assist Level: (Mod assist)      Upper Body Dressing/Undressing Upper body dressing   What is the patient wearing?: Pull over shirt/dress     Pull over shirt/dress - Perfomed by patient: Thread/unthread left sleeve, Put head through opening Pull over shirt/dress - Perfomed by helper: Thread/unthread right sleeve, Pull shirt over trunk        Upper body assist Assist Level: (mod a)      Lower Body Dressing/Undressing Lower body dressing   What is the patient wearing?: Pants, Socks, Shoes   Underwear - Performed by helper: Thread/unthread right underwear leg, Thread/unthread left underwear leg, Pull underwear up/down Pants- Performed by patient: Pull pants up/down Pants- Performed by helper: Thread/unthread right pants leg, Thread/unthread left pants leg   Non-skid slipper socks- Performed by helper: Don/doff right sock, Don/doff left sock   Socks - Performed by helper: Don/doff right sock, Don/doff left sock   Shoes - Performed by helper: Don/doff right shoe, Don/doff left shoe, Fasten right, Fasten left          Lower body assist Assist for lower body dressing: (total)      Toileting Toileting Toileting activity did not occur: Safety/medical concerns   Toileting steps completed by  helper: Performs perineal hygiene Toileting Assistive Devices: Grab bar or rail  Toileting assist Assist level: Touching or steadying assistance (Pt.75%)   Transfers Chair/bed transfer   Chair/bed transfer method: Stand pivot Chair/bed transfer assist level: Maximal assist (Pt 25 - 49%/lift and lower) Chair/bed transfer assistive device: Bedrails,  Armrests Mechanical lift: Stedy   Locomotion Ambulation Ambulation activity did not occur: Safety/medical concerns   Max distance: 15 Assist level: Maximal assist (Pt 25 - 49%)   Wheelchair   Type: Manual Max wheelchair distance: 25 Assist Level: Moderate assistance (Pt 50 - 74%)  Cognition Comprehension Comprehension assist level: Understands basic 75 - 89% of the time/ requires cueing 10 - 24% of the time  Expression Expression assist level: Expresses basic 75 - 89% of the time/requires cueing 10 - 24% of the time. Needs helper to occlude trach/needs to repeat words.  Social Interaction Social Interaction assist level: Interacts appropriately 75 - 89% of the time - Needs redirection for appropriate language or to initiate interaction.  Problem Solving Problem solving assist level: Solves basic 50 - 74% of the time/requires cueing 25 - 49% of the time  Memory Memory assist level: Recognizes or recalls 75 - 89% of the time/requires cueing 10 - 24% of the time   Medical Problem List and Plan: 1. 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 Nice motor and linguistic gains   2. DVT Prophylaxis/Anticoagulation: SCDs. Monitor for any signs of DVT  -Dopplers negative for DVT 3. Pain Management/spasticity:   - Continue baclofen 20 mg 3 times daily-has responded well to this so far   -PRAFO RLE  -ROM with therapy, family  -no WHO indicated yet RUE  -suspect he will be a botox candidate for RUE (pecs) as outpt 4. Mood: Provide emotional support 5. Neuropsych: This patient iscapable of making decisions on hisown behalf. 6. Skin/Wound Care: rash resolved  -continuenystatin powder-    -keep skin dry 7. Fluids/Electrolytes/Nutrition:   Vigorous appetite 8.CAD with CABG.No chest pain or shortness of breath 9. Hypertension. 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 Counseling.Continue inhalers  -off oxygen 11. Hyponatremia:  -likely central  Sodium 136 on 11/1  12. Mild leukocytosis:     -wbc's down to 8.4 most recently         LOS (Days) 20 A FACE TO FACE EVALUATION WAS PERFORMED  Ranelle OysterSWARTZ,Jabin Tapp T, MD 09/29/2017 8:45 AM

## 2017-09-29 NOTE — Plan of Care (Signed)
Pt denies any pain at present. Rash is healing.

## 2017-09-29 NOTE — Significant Event (Signed)
pt6 refused, would like it much later.

## 2017-09-29 NOTE — Progress Notes (Signed)
Speech Language Pathology Daily Session Note  Patient Details  Name: Jaime FuelRichard A Botting MRN: [id number] Date of Birth: 1946-03-29  Today's Date: 09/29/2017 SLP Individual Time: 0905-1005 SLP Individual Time Calculation (min): 60 min  Short Term Goals: Week 3: SLP Short Term Goal 1 (Week 3): Patient will recall new, daily information with Min A multimodal cues.  SLP Short Term Goal 2 (Week 3): Patient will demonstrate functional problem solving for basic and familiar tasks with Min A verbal cues.  SLP Short Term Goal 3 (Week 3): Patient will follow 2 step directions with 75% accuracy and Min A verbal cues.  SLP Short Term Goal 4 (Week 3): Patient will demonstrate selective attention in a mildly distracting enviornment  to functional tasks for ~60 minutes with Mod I.  SLP Short Term Goal 5 (Week 3): Patient will verbally express mildly abstract thoughts at the phrase and sentence level with superivsion verbal cues.  SLP Short Term Goal 6 (Week 3): Patient will demonstrate reading comprehension at the phrase level with Min A verbal cues.   Skilled Therapeutic Interventions:  Pt was seen for skilled ST targeting communication goals.  Therapist facilitated the session with reading comprehension tasks of paragraphs increasing in complexity.  Pt was able to retell details of each targeted reading passage in his own words to demonstrate comprehension with supervision question cues when passage's content was basic; however, as complexity increased, pt needed up to min assist verbal cues for retelling in his own words.  Pt and his wife both report that they feel his communication is near baseline but wife indicated that pt may benefit from more cognitive therapy.  Pt was left in wheelchair with call bell within reach and wife at bedside.  Continue per current plan of care.    Function:  Eating Eating                 Cognition Comprehension Comprehension assist level: Understands basic 75 - 89% of  the time/ requires cueing 10 - 24% of the time  Expression   Expression assist level: Expresses basic 75 - 89% of the time/requires cueing 10 - 24% of the time. Needs helper to occlude trach/needs to repeat words.  Social Interaction Social Interaction assist level: Interacts appropriately 75 - 89% of the time - Needs redirection for appropriate language or to initiate interaction.  Problem Solving Problem solving assist level: Solves basic 50 - 74% of the time/requires cueing 25 - 49% of the time  Memory Memory assist level: Recognizes or recalls 75 - 89% of the time/requires cueing 10 - 24% of the time    Pain Pain Assessment Pain Assessment: No/denies pain  Therapy/Group: Individual Therapy  Edy Mcbane, Melanee SpryNicole L 09/29/2017, 11:28 AM

## 2017-09-29 NOTE — Progress Notes (Signed)
Occupational Therapy Session Note  Patient Details  Name: Jaime Pierce MRN: [id number] Date of Birth: 08/14/1946  Today's Date: 09/29/2017 OT Individual Time: 1300-1400 OT Individual Time Calculation (min): 60 min    Short Term Goals: Week 2:  OT Short Term Goal 1 (Week 2): Pt will stand with max A of 1 to prepare for LB dressing. OT Short Term Goal 2 (Week 2): Pt will transfer bed to chair with max A of 1 to prepare for a safe transfer to a BSC. OT Short Term Goal 3 (Week 2): Pt will demonstrate improved praxis to don shirt with min A. OT Short Term Goal 4 (Week 2): Pt will attend to R side by using L hand to move R arm across body to prepare for rolling in bed.   Skilled Therapeutic Interventions/Progress Updates:    Upon entering the room, pt verbalizing need for BM this session. OT assisted pt into bathroom via wheelchair and pt performed total A squat pivot to R side onto drop arm commode chair placed over toilet. Pt standing from toilet with max A while assisted with LB clothing management. Pt having successful BM and required assistance for hygiene. Pt performed stand pivot transfer to the L with max A. Pt required max multimodal cues for sequencing of transfers. Pt washing hands in sink with min A . OT also educating pt and wife on recommendations for discharge of SNF vs. Home with HHOT based on pt's current needs. They verbalized understanding and would like to discuss together. OT providing caregiver with PROM stretching HEP for R hemiplegic side. OT to review at next session. Call bell and all needed items within reach upon exiting the room.   Therapy Documentation Precautions:  Precautions Precautions: Fall Precaution Comments: right sided weakness, right inattention Restrictions Weight Bearing Restrictions: No General:   Vital Signs: Therapy Vitals Temp: 98.6 F (37 C) Temp Source: Oral Pulse Rate: 86 Resp: 18 BP: 125/67 Patient Position (if appropriate):  Lying Oxygen Therapy SpO2: 95 % O2 Device: Not Delivered Pain: Pain Assessment Pain Assessment: No/denies pain :   Other Treatments:    See Function Navigator for Current Functional Status.   Therapy/Group: Individual Therapy  Alen BleacherBradsher, Sejal Cofield P 09/29/2017, 2:44 PM

## 2017-09-29 NOTE — Progress Notes (Signed)
Physical Therapy Session Note  Patient Details  Name: Jaime Pierce MRN: [id number] Date of Birth: 07/12/1946  Today's Date: 09/29/2017 PT Individual Time: 1025-1155 PT Individual Time Calculation (min): 90 min   Short Term Goals: Week 3:  PT Short Term Goal 1 (Week 3): Pt will demonstrate transfers w/c <>bed with minA PT Short Term Goal 2 (Week 3): Pt will demonstrate bed mobility with minA from flat bed PT Short Term Goal 3 (Week 3): Pt will ambulate x20' with modA PT Short Term Goal 4 (Week 3): Pt will demonstrate sit <>stand with consistent modA  Skilled Therapeutic Interventions/Progress Updates: Pt received seated in w/c, denies pain and agreeable to treatment. Transfer w/c >mat towards L side with modA and improving coordination, reduced pushing through LUE. Sit <>stand from elevated mat table maxA for initiation, minA to complete. Standing balance in front of mirror with eva walker, LUE palm up on walker, and tactile cues for weight shifting while performing pre-gait forward/backward stepping in place. LUE propped on elbow during rest breaks to continue desensitization to L weight shifts. Gait with eva walker x20' including turns with modA +2 for steering walker, tactile cues for weight shifting, assist to reduce RLE scissoring, and facilitate RLE stance. Sit <>stand facing parallel bars with mirror feedback x5 reps with maxA>modA with repetition and reduced pushing; cues to maintain focus on mirror for awareness of midline. Standing RLE taps laterally and forward onto platform for focus on contralateral weight shift and RLE coordination. Improving tolerance of L weight shifts with mod multimodal cueing. Returned to room totalA by wife, all needs in reach.      Therapy Documentation Precautions:  Precautions Precautions: Fall Precaution Comments: right sided weakness, right inattention Restrictions Weight Bearing Restrictions: No Pain: Pain Assessment Pain Assessment: No/denies  pain   See Function Navigator for Current Functional Status.   Therapy/Group: Individual Therapy  [name] 09/29/2017, 11:56 AM

## 2017-09-29 NOTE — Progress Notes (Signed)
Occupational Therapy Session Note  Patient Details  Name: Jaime Pierce MRN: [id number] Date of Birth: 04-29-1946  Today's Date: 09/29/2017 OT Individual Time: 1500-1534 OT Individual Time Calculation (min): 34 min    Short Term Goals: Week 2:  OT Short Term Goal 1 (Week 2): Pt will stand with max A of 1 to prepare for LB dressing. OT Short Term Goal 2 (Week 2): Pt will transfer bed to chair with max A of 1 to prepare for a safe transfer to a BSC. OT Short Term Goal 3 (Week 2): Pt will demonstrate improved praxis to don shirt with min A. OT Short Term Goal 4 (Week 2): Pt will attend to R side by using L hand to move R arm across body to prepare for rolling in bed.   Skilled Therapeutic Interventions/Progress Updates:    Pt transferred from supine to sit EOB with min assist.  Completed squat pivot transfer from the bed to wheelchair with mod assist.  Worked on sit to stand and functional mobility in the dayroom using three muskateers technique.  Pt with increased motor planning difficulty to maintain upright posture in trunk and hip as well as left knee extension.  Max facilitation needed to put all three together in order to stabilize on the RLE for stepping.  Total assist +2 (pt 30%) for functional mobility 10-12 ft X 2.  Finished session with return to the room via wheelchair.  Pt transferred squat pivot back to the bed and to supine with mod assist and max demonstrational cueing for sequencing.  Pt left with spouse present and call button in reach.    Therapy Documentation Precautions:  Precautions Precautions: Fall Precaution Comments: right sided weakness, right inattention Restrictions Weight Bearing Restrictions: No Pain: Pain Assessment Pain Assessment: No/denies pain ADL: See Function Navigator for Current Functional Status.   Therapy/Group: Individual Therapy  Sirr Kabel OTR/L 09/29/2017, 3:45 PM

## 2017-09-30 ENCOUNTER — Inpatient Hospital Stay (HOSPITAL_COMMUNITY): Payer: Medicare HMO | Admitting: Occupational Therapy

## 2017-09-30 ENCOUNTER — Inpatient Hospital Stay (HOSPITAL_COMMUNITY): Payer: Medicare HMO | Admitting: Speech Pathology

## 2017-09-30 ENCOUNTER — Inpatient Hospital Stay (HOSPITAL_COMMUNITY): Payer: Medicare HMO | Admitting: Physical Therapy

## 2017-09-30 ENCOUNTER — Other Ambulatory Visit: Payer: Self-pay

## 2017-09-30 NOTE — Progress Notes (Signed)
[facility] MEDICINE & REHABILITATION     PROGRESS NOTE   Subjective/Complaints: No issues overnight.  Right arm still sensitive at times but tolerable.  Appetite still good  ROS: pt denies nausea, vomiting, diarrhea, cough, shortness of breath or chest pain   Objective: Vital Signs: Blood pressure 128/66, pulse 81, temperature 97.6 F (36.4 C), temperature source Oral, resp. rate 18, height 6\' 1"  (1.854 m), weight 84.6 kg (186 lb 9.6 oz), SpO2 94 %. No results found. No results for input(s): WBC, HGB, HCT, PLT in the last 72 hours. No results for input(s): NA, K, CL, GLUCOSE, BUN, CREATININE, CALCIUM in the last 72 hours.  Invalid input(s): CO CBG (last 3)  No results for input(s): GLUCAP in the last 72 hours.  Wt Readings from Last 3 Encounters:  09/29/17 84.6 kg (186 lb 9.6 oz)  09/01/17 85.1 kg (187 lb 9.8 oz)  06/30/17 83 kg (183 lb)    Physical Exam:   Constitutional: He appears well-developed. NAD. HENT: Normocephalic. Atraumatic. Eyes: EOMare normal. No discharge. Cardiovascular: RRR without murmur. No JVD    Respiratory: CTA Bilaterally without wheezes or rales. Normal effort          GI: BS +, non-tender, non-distended .  Skin.warm and dry. Rash has resolved on back Neurological: He is alertand oriented.  Much improved language/more automatic Motor: LUE/LLE: 5/5 proximal to distal  RUE: Trace to 1 out of 5 proximal to 2+ out of 5 in the right hand..  RLE: 3/5 HF, 3/5 KE and ankle PF/DF.   1-2/4  Tone biceps, pecs RUE. RLE--tr-1/4 extensor tone--tone stable to improved LLE-moving more freely Psych: affect more dynamic  Assessment/Plan: 1. 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 Physiatrist is providing close team supervision and 24 hour management of active medical problems listed below. Physiatrist and rehab  team continue to assess barriers to discharge/monitor patient progress toward functional and medical goals.  Function:  Bathing Bathing position   Position: Shower  Bathing parts Body parts bathed by patient: Chest, Abdomen, Front perineal area, Right upper leg, Left upper leg, Buttocks Body parts bathed by helper: Right arm, Left arm, Right lower leg, Left lower leg, Back  Bathing assist Assist Level: (Mod assist)      Upper Body Dressing/Undressing Upper body dressing   What is the patient wearing?: Pull over shirt/dress     Pull over shirt/dress - Perfomed by patient: Thread/unthread left sleeve, Put head through opening Pull over shirt/dress - Perfomed by helper: Thread/unthread right sleeve, Pull shirt over trunk        Upper body assist Assist Level: (mod a)      Lower Body Dressing/Undressing Lower body dressing   What is the patient wearing?: Pants, Socks, Shoes   Underwear - Performed by helper: Thread/unthread right underwear leg, Thread/unthread left underwear leg, Pull underwear up/down Pants- Performed by patient: Pull pants up/down Pants- Performed by helper: Thread/unthread right pants leg, Thread/unthread left pants leg   Non-skid slipper socks- Performed by helper: Don/doff right sock, Don/doff left sock   Socks - Performed by helper: Don/doff right sock, Don/doff left sock   Shoes - Performed by helper: Don/doff right shoe, Don/doff left shoe, Fasten right, Fasten left          Lower body assist Assist for lower body dressing: (total)      Toileting Toileting Toileting activity did not  occur: Safety/medical concerns   Toileting steps completed by helper: Adjust clothing prior to toileting, Performs perineal hygiene, Adjust clothing after toileting Toileting Assistive Devices: Grab bar or rail  Toileting assist Assist level: Two helpers   Transfers Chair/bed transfer   Chair/bed transfer method: Squat pivot Chair/bed transfer assist level:  Moderate assist (Pt 50 - 74%/lift or lower) Chair/bed transfer assistive device: Bedrails, Armrests Mechanical lift: Stedy   Locomotion Ambulation Ambulation activity did not occur: Safety/medical concerns   Max distance: 15 Assist level: Maximal assist (Pt 25 - 49%)   Wheelchair   Type: Manual Max wheelchair distance: 25 Assist Level: Moderate assistance (Pt 50 - 74%)  Cognition Comprehension Comprehension assist level: Understands basic 75 - 89% of the time/ requires cueing 10 - 24% of the time  Expression Expression assist level: Expresses basic 75 - 89% of the time/requires cueing 10 - 24% of the time. Needs helper to occlude trach/needs to repeat words.  Social Interaction Social Interaction assist level: Interacts appropriately 75 - 89% of the time - Needs redirection for appropriate language or to initiate interaction.  Problem Solving Problem solving assist level: Solves basic 75 - 89% of the time/requires cueing 10 - 24% of the time  Memory Memory assist level: Recognizes or recalls 75 - 89% of the time/requires cueing 10 - 24% of the time   Medical Problem List and Plan: 1. 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  Patient demonstrating ongoing cognitive, linguistic, motoric progress   2. DVT Prophylaxis/Anticoagulation: SCDs. Monitor for any signs of DVT  -Dopplers negative for DVT 3. Pain Management/spasticity:   - Continue baclofen 20 mg 3 times daily-has responded well to this so far   -PRAFO RLE  -ROM with therapy, family  -no WHO indicated  RUE  -may be a botox candidate for RUE (pecs) as outpt although tone is improving.  4. Mood: Provide emotional support 5. Neuropsych: This patient iscapable of making decisions on hisown behalf. 6. Skin/Wound Care: rash resolved  -continuenystatin powder-    -keep skin dry 7. Fluids/Electrolytes/Nutrition:    Vigorous appetite  -recheck bmet on Monday 8.CAD with CABG.No chest pain or shortness of breath 9. Hypertension. Lopressor 25 mg daily   Controlled on 11/8 10.COPD/tobacco abuse. Counseling.Continue inhalers  -off oxygen 11. Hyponatremia:  -likely central  Sodium 136 on 11/1  12. Mild leukocytosis:     -resolved  -follow up on Monday         LOS (Days) 21 A FACE TO FACE EVALUATION WAS PERFORMED  Ranelle OysterSWARTZ,Arnie Clingenpeel T, MD 09/30/2017 8:19 AM

## 2017-09-30 NOTE — Progress Notes (Signed)
Speech Language Pathology Daily Session Note  Patient Details  Name: Jaime Pierce MRN: [id number] Date of Birth: February 28, 1946  Today's Date: 09/30/2017 SLP Individual Time: [id number] SLP Individual Time Calculation (min): 25 min  Short Term Goals: Week 3: SLP Short Term Goal 1 (Week 3): Patient will recall new, daily information with Min A multimodal cues.  SLP Short Term Goal 2 (Week 3): Patient will demonstrate functional problem solving for basic and familiar tasks with Min A verbal cues.  SLP Short Term Goal 3 (Week 3): Patient will follow 2 step directions with 75% accuracy and Min A verbal cues.  SLP Short Term Goal 4 (Week 3): Patient will demonstrate selective attention in a mildly distracting enviornment  to functional tasks for ~60 minutes with Mod I.  SLP Short Term Goal 5 (Week 3): Patient will verbally express mildly abstract thoughts at the phrase and sentence level with superivsion verbal cues.  SLP Short Term Goal 6 (Week 3): Patient will demonstrate reading comprehension at the phrase level with Min A verbal cues.   Skilled Therapeutic Interventions:  Pt was seen for skilled ST targeting cognitive goals.  SLP facilitated the session with a novel route finding task to address working memory.  Pt initially needed mod-max assist cues for working memory of a set of verbally presented directions in order to be able to create a route between two targeted locations; however, with the use of a visual aid of route which pt then had to verbally describe therapist was able to fade cues to min assist.  Pt demonstrated good insight into task difficulty.  Pt was returned to room and left in wheelchair with wife present.  Continue per current plan of care.    Function:  Eating Eating                 Cognition Comprehension Comprehension assist level: Understands basic 75 - 89% of the time/ requires cueing 10 - 24% of the time  Expression   Expression assist level: Expresses  basic 75 - 89% of the time/requires cueing 10 - 24% of the time. Needs helper to occlude trach/needs to repeat words.  Social Interaction Social Interaction assist level: Interacts appropriately 75 - 89% of the time - Needs redirection for appropriate language or to initiate interaction.  Problem Solving Problem solving assist level: Solves basic 50 - 74% of the time/requires cueing 25 - 49% of the time  Memory Memory assist level: Recognizes or recalls 50 - 74% of the time/requires cueing 25 - 49% of the time    Pain Pain Assessment Pain Assessment: No/denies pain  Therapy/Group: Individual Therapy  Laquenta Whitsell, Melanee SpryNicole L 09/30/2017, 1:28 PM

## 2017-09-30 NOTE — Progress Notes (Signed)
Speech Language Pathology Daily Session Note  Patient Details  Name: Jaime Pierce MRN: [id number] Date of Birth: 11/01/1946  Today's Date: 09/30/2017 SLP Individual Time: 1100-1200 SLP Individual Time Calculation (min): 60 min  Short Term Goals: Week 3: SLP Short Term Goal 1 (Week 3): Patient will recall new, daily information with Min A multimodal cues.  SLP Short Term Goal 2 (Week 3): Patient will demonstrate functional problem solving for basic and familiar tasks with Min A verbal cues.  SLP Short Term Goal 3 (Week 3): Patient will follow 2 step directions with 75% accuracy and Min A verbal cues.  SLP Short Term Goal 4 (Week 3): Patient will demonstrate selective attention in a mildly distracting enviornment  to functional tasks for ~60 minutes with Mod I.  SLP Short Term Goal 5 (Week 3): Patient will verbally express mildly abstract thoughts at the phrase and sentence level with superivsion verbal cues.  SLP Short Term Goal 6 (Week 3): Patient will demonstrate reading comprehension at the phrase level with Min A verbal cues.   Skilled Therapeutic Interventions: Skilled treatment session focused on cognitive goals. SLP facilitated session by administering the MoCA-BLIND due to UE weakness. Patient scored 16/22 points with a score of 18 or above considered normal. Patient demonstrated deficits in immediate and short-term recall. Patient educated on strategies to utilize to maximize recall but will need reinforcement. Patient left upright in wheelchair with all needs within reach. Continue with current plan of care.      Function:   Cognition Comprehension Comprehension assist level: Understands basic 75 - 89% of the time/ requires cueing 10 - 24% of the time  Expression   Expression assist level: Expresses basic 75 - 89% of the time/requires cueing 10 - 24% of the time. Needs helper to occlude trach/needs to repeat words.  Social Interaction Social Interaction assist level:  Interacts appropriately 75 - 89% of the time - Needs redirection for appropriate language or to initiate interaction.  Problem Solving Problem solving assist level: Solves basic 75 - 89% of the time/requires cueing 10 - 24% of the time  Memory Memory assist level: Recognizes or recalls 50 - 74% of the time/requires cueing 25 - 49% of the time    Pain Pain Assessment Pain Assessment: No/denies pain  Therapy/Group: Individual Therapy  Mase Dhondt 09/30/2017, 2:00 PM

## 2017-09-30 NOTE — Progress Notes (Signed)
Physical Therapy Session Note  Patient Details  Name: Jaime FuelRichard A Nehring MRN: [id number] Date of Birth: [birth year]  Today's Date: 09/30/2017 PT Individual Time: 1000-1100 PT Individual Time Calculation (min): 60 min   Short Term Goals: Week 3:  PT Short Term Goal 1 (Week 3): Pt will demonstrate transfers w/c <>bed with minA PT Short Term Goal 2 (Week 3): Pt will demonstrate bed mobility with minA from flat bed PT Short Term Goal 3 (Week 3): Pt will ambulate x20' with modA PT Short Term Goal 4 (Week 3): Pt will demonstrate sit <>stand with consistent modA  Skilled Therapeutic Interventions/Progress Updates: Pt received seated in w/c, denies pain and agreeable to treatment. Squat pivot transfer throughout session x3 with modA to both sides. Gait with litegait and eva walker x15' +3A for equipment management, cueing for L weight shift and reducing RLE scissoring. Gait x15' no AD with litegait; increased assist needed for RLE stance control. Gait with three muskateers x82' maxA +2; tactile cueing for weight shifting, assist for RLE progression and stance. Returned to w/c with stand pivot maxA. Wife recorded videos of pt ambulating to show pt to increase awareness of gait quality, visualize weight shifting and midline orientation. Handoff to SLP at end of session.      Therapy Documentation Precautions:  Precautions Precautions: Fall Precaution Comments: right sided weakness, right inattention Restrictions Weight Bearing Restrictions: No  See Function Navigator for Current Functional Status.   Therapy/Group: Individual Therapy  [name] 09/30/2017, 11:24 AM

## 2017-09-30 NOTE — Progress Notes (Signed)
Occupational Therapy Session Note  Patient Details  Name: Jaime FuelRichard A Geigle MRN: [id number] Date of Birth: 04-21-1946  Today's Date: 09/30/2017 OT Individual Time: 0800-0900 OT Individual Time Calculation (min): 60 min    Short Term Goals: Week 2:  OT Short Term Goal 1 (Week 2): Pt will stand with max A of 1 to prepare for LB dressing. OT Short Term Goal 2 (Week 2): Pt will transfer bed to chair with max A of 1 to prepare for a safe transfer to a BSC. OT Short Term Goal 3 (Week 2): Pt will demonstrate improved praxis to don shirt with min A. OT Short Term Goal 4 (Week 2): Pt will attend to R side by using L hand to move R arm across body to prepare for rolling in bed.   Skilled Therapeutic Interventions/Progress Updates:    Upon entering the room, pt supine in bed awaiting therapist with wife present. Pt requesting to shower this session. Supine >sit with min A to EOB. Pt squat pivot transfer to R with total A from bed >wheelchair. Pt transferred again to R side with total A onto TTB for bathing. Pt seated with supervision for safety with sitting balance. Pt utilized R UE with hand over hand technique and required increased time with mod multimodal cues for sequencing of task. Pt transferred to wheelchair to L with max A stand pivot transfer. Focus on hemiplegic dressing techniques with pt making improvements in UB and LB dressing. Pt standing with min - mod A for balance while pull pants over B hips. Pt with several questions regarding progression since stroke, rehab stay, progress, and deficits. OT answered questions and pt satisfied with no further questions at this time. Pt remained seated in wheelchair with call bell and all needed items within reach.   Therapy Documentation Precautions:  Precautions Precautions: Fall Precaution Comments: right sided weakness, right inattention Restrictions Weight Bearing Restrictions: No  See Function Navigator for Current Functional  Status.   Therapy/Group: Individual Therapy  Alen BleacherBradsher, Oshay Stranahan P 09/30/2017, 1:26 PM

## 2017-10-01 ENCOUNTER — Inpatient Hospital Stay (HOSPITAL_COMMUNITY): Payer: Medicare HMO | Admitting: Speech Pathology

## 2017-10-01 ENCOUNTER — Inpatient Hospital Stay (HOSPITAL_COMMUNITY): Payer: Medicare HMO | Admitting: Physical Therapy

## 2017-10-01 ENCOUNTER — Inpatient Hospital Stay (HOSPITAL_COMMUNITY): Payer: Medicare HMO | Admitting: Occupational Therapy

## 2017-10-01 ENCOUNTER — Inpatient Hospital Stay (HOSPITAL_COMMUNITY): Payer: Medicare HMO

## 2017-10-01 DIAGNOSIS — R52 Pain, unspecified: Secondary | ICD-10-CM

## 2017-10-01 MED ORDER — DICLOFENAC SODIUM 1 % TD GEL
2.0000 g | Freq: Four times a day (QID) | TRANSDERMAL | Status: DC | PRN
  Administered 2017-10-02 – 2017-10-14 (×5): 2 g via TOPICAL
  Filled 2017-10-01 (×2): qty 100

## 2017-10-01 NOTE — Progress Notes (Signed)
[facility] PHYSICAL MEDICINE & REHABILITATION     PROGRESS NOTE   Subjective/Complaints: Pt seen laying in bed this AM.  Wife at bedside.  Patient states he slept well overnight.  Wife notes edema in left knee and that he turned it the night of the stroke.    ROS: +Right knee pain. Denies nausea, vomiting, diarrhea, shortness of breath or chest pain   Objective: Vital Signs: Blood pressure 119/65, pulse 85, temperature 98 F (36.7 C), temperature source Oral, resp. rate 18, height 6\' 1"  (1.854 m), weight 84.6 kg (186 lb 9.6 oz), SpO2 93 %. No results found. No results for input(s): WBC, HGB, HCT, PLT in the last 72 hours. No results for input(s): NA, K, CL, GLUCOSE, BUN, CREATININE, CALCIUM in the last 72 hours.  Invalid input(s): CO CBG (last 3)  No results for input(s): GLUCAP in the last 72 hours.  Wt Readings from Last 3 Encounters:  09/29/17 84.6 kg (186 lb 9.6 oz)  09/01/17 85.1 kg (187 lb 9.8 oz)  06/30/17 83 kg (183 lb)    Physical Exam:  Constitutional: He appears well-developed. NAD. HENT: Normocephalic. Atraumatic. Eyes: EOMare normal. No discharge. Cardiovascular: RRR. No JVD    Respiratory: CTA Bilaterally. Normal effort          GI: BS +, non-distended.  MSK: Edema right knee with mild tenderness Neurological: He is alertand oriented.  Motor: LUE/LLE: 5/5 proximal to distal  RUE: 2-/5 proximal to distal  RLE: 3/5 HF, 3/5 KE and ankle PF/DF.    Skin.warm and dry. Rash has resolved on back Psych: Normal mood and behavior.   Assessment/Plan: 1. 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 Physiatrist is providing close team supervision and 24 hour management of active medical problems listed below. Physiatrist and rehab team continue to assess barriers to discharge/monitor patient progress toward functional and medical  goals.  Function:  Bathing Bathing position   Position: Shower  Bathing parts Body parts bathed by patient: Chest, Abdomen, Front perineal area, Right upper leg, Left upper leg, Buttocks Body parts bathed by helper: Right arm, Left arm, Right lower leg, Left lower leg, Back  Bathing assist Assist Level: (mod a)      Upper Body Dressing/Undressing Upper body dressing   What is the patient wearing?: Pull over shirt/dress     Pull over shirt/dress - Perfomed by patient: Thread/unthread left sleeve, Put head through opening, Pull shirt over trunk Pull over shirt/dress - Perfomed by helper: Thread/unthread right sleeve        Upper body assist Assist Level: (min A)      Lower Body Dressing/Undressing Lower body dressing   What is the patient wearing?: Pants, Socks, Shoes   Underwear - Performed by helper: Thread/unthread right underwear leg, Thread/unthread left underwear leg, Pull underwear up/down Pants- Performed by patient: Pull pants up/down, Thread/unthread left pants leg Pants- Performed by helper: Thread/unthread right pants leg   Non-skid slipper socks- Performed by helper: Don/doff right sock, Don/doff left sock   Socks - Performed by helper: Don/doff right sock, Don/doff left sock   Shoes - Performed by helper: Don/doff right shoe, Don/doff left shoe, Fasten right, Fasten left          Lower body assist Assist for lower body dressing: (max A)      Toileting Toileting Toileting activity did not occur: Safety/medical concerns   Toileting steps  completed by helper: Adjust clothing prior to toileting, Performs perineal hygiene, Adjust clothing after toileting Toileting Assistive Devices: Grab bar or rail  Toileting assist Assist level: Two helpers   Transfers Chair/bed transfer   Chair/bed transfer method: Squat pivot Chair/bed transfer assist level: Moderate assist (Pt 50 - 74%/lift or lower) Chair/bed transfer assistive device: Armrests Mechanical lift:  Stedy   Locomotion Ambulation Ambulation activity did not occur: Safety/medical concerns   Max distance: 82 Assist level: 2 helpers   Wheelchair   Type: Manual Max wheelchair distance: 25 Assist Level: Moderate assistance (Pt 50 - 74%)  Cognition Comprehension Comprehension assist level: Understands basic 75 - 89% of the time/ requires cueing 10 - 24% of the time  Expression Expression assist level: Expresses basic 75 - 89% of the time/requires cueing 10 - 24% of the time. Needs helper to occlude trach/needs to repeat words.  Social Interaction Social Interaction assist level: Interacts appropriately 75 - 89% of the time - Needs redirection for appropriate language or to initiate interaction.  Problem Solving Problem solving assist level: Solves basic 75 - 89% of the time/requires cueing 10 - 24% of the time  Memory Memory assist level: Recognizes or recalls 50 - 74% of the time/requires cueing 25 - 49% of the time   Medical Problem List and Plan: 1. 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   2. DVT Prophylaxis/Anticoagulation: SCDs. Monitor for any signs of DVT  -Dopplers negative for DVT 3. Pain Management/spasticity:   - Continue baclofen 20 mg 3 times daily-has responded well to this so far  -PRAFO RLE  -ROM with therapy, family  -may be a botox candidate for RUE (pecs) as outpt although tone is improving.  4. Mood: Provide emotional support 5. Neuropsych: This patient iscapable of making decisions on hisown behalf. 6. Skin/Wound Care: rash resolved   -keep skin dry 7. Fluids/Electrolytes/Nutrition:     -recheck bmet on Monday 8.CAD with CABG.No chest pain or shortness of breath 9. Hypertension. Lopressor 25 mg daily   Controlled on 11/9 10.COPD/tobacco abuse. Counseling.Continue inhalers  -off oxygen 11. Hyponatremia: Resolved  Sodium 136 on 11/1  12. Mild  leukocytosis:     -resolved  -follow up on Monday 13. Right knee pain with edema  Xray ordered   Voltaren gel PRN  LOS (Days) 22 A FACE TO FACE EVALUATION WAS PERFORMED  Kori Colin Karis JubaAnil Kasyn Rolph, MD 10/01/2017 10:14 AM

## 2017-10-01 NOTE — Progress Notes (Signed)
Physical Therapy Weekly Progress Note  Patient Details  Name: Jaime Pierce MRN: [id number] Date of Birth: 12-05-1945  Beginning of progress report period: September 24, 2017 End of progress report period: October 01, 2017  Today's Date: 10/01/2017 PT Individual Time: 1100-1200 and 1515-1550 PT Individual Time Calculation (min): 60 min and 40 min (total 100 min)   Patient has met 2 of 4 short term goals.  Patient currently requires minA/S for bed mobility, mod/maxA for transfers, mod/maxA for gait with rail in hallway. 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 Patient is making slow but steady progress towards all goals. Patient would benefit from additional time in rehab to progress towards goals, and reach a minA functional level that the wife can manage at home.  Patient continues to demonstrate the following deficits muscle weakness, muscle joint tightness and muscle paralysis, decreased cardiorespiratoy endurance, impaired timing and sequencing, abnormal tone, unbalanced muscle activation, motor apraxia, decreased coordination and decreased motor planning, decreased midline orientation, decreased attention to right, decreased motor planning and ideational apraxia, decreased initiation, decreased problem solving, decreased safety awareness and delayed processing and decreased sitting balance, decreased standing balance, decreased postural control, hemiplegia and decreased balance strategies and therefore will continue to benefit from skilled PT intervention to increase functional independence with mobility.  Patient progressing toward long term goals..  Continue plan of care.  PT Short Term Goals Week 3:  PT Short Term Goal 1 (Week 3): Pt will demonstrate transfers w/c <>bed with minA PT Short Term Goal 1 - Progress (Week 3): Not met PT Short Term Goal 2 (Week 3): Pt will demonstrate bed  mobility with minA from flat bed PT Short Term Goal 2 - Progress (Week 3): Met PT Short Term Goal 3 (Week 3): Pt will ambulate x20' with modA PT Short Term Goal 3 - Progress (Week 3): Met PT Short Term Goal 4 (Week 3): Pt will demonstrate sit <>stand with consistent modA PT Short Term Goal 4 - Progress (Week 3): Not met Week 4:  PT Short Term Goal 1 (Week 4): Pt will demonstrate transfers w/c <>bed with minA PT Short Term Goal 2 (Week 4): Pt will ambulate x20' with consistent minA PT Short Term Goal 3 (Week 4): Pt will perform bed mobility with S on flat bed in ADL apartment PT Short Term Goal 4 (Week 4): Pt will demonstrate sit <>stand with consistent modA  Skilled Therapeutic Interventions/Progress Updates: Tx 1: Received in bed, denies pain and agreeable to treatment. Supine>sit with HOB flat and no rails, S and increased time for RLE management. Stand pivot transfer maxA to R side into w/c. Gait training performed with rail x25'; improved L weight shift when cued for shoulder to wall as opposed to hip towards wall. Improving RLE progression with occasional assist to reduce scissoring. Requires tactile cues for RLE stance control while progressing L. Gait trial x2 with LUE HHA and +2A; improving initiation of L weight shift, however worsening RLE stance control/coordination. Standing weight shifts with staggered stance RLE in front for focus on weight acceptance on R and stance control. Activity downgraded to normal BOS weight shifts, and attempted one LLE tap to 3" step with maxA. Returned to room totalA by wife at end of session.   Tx 2: Denies pain. Transfer w/c <>mat table modA squat pivot. Sit <>stand from elevated mat table with therapist in front of pt, cues for LUE holding RUE to reduce  pushing, yoga block between feet to maintain neutral alignment. MaxA initially, reduced to min/modA with repetition, use of mirror feedback for trunk alignment and pt able to initiate weight shift to L to  correct for pushing and LOB to R. Upon reaching standing pt able to demonstrate maintaining neutral alignment with min guard, weight shift R/L minimally displacing COG with min guard. Attempted LLE tap to step however once pt able to demo RLE extension/stance control and shifts attention to moving LLE, RLE begins to buckle. Returned to w/c as above; wife transported pt back to room [place].      Therapy Documentation Precautions:  Precautions Precautions: Fall Precaution Comments: right sided weakness, right inattention Restrictions Weight Bearing Restrictions: No   See Function Navigator for Current Functional Status.  Therapy/Group: Individual Therapy  Luberta Mutter 10/01/2017, 1:38 PM

## 2017-10-01 NOTE — Progress Notes (Signed)
Occupational Therapy Weekly Progress Note  Patient Details  Name: Jaime Pierce MRN: [id number] Date of Birth: Jul 19, 1946  Beginning of progress report period: 09/20/17 End of progress report period: 10/01/17  Today's Date: 10/01/2017 OT Individual Time: 6301-6010 and 9323-5573 OT Individual Time Calculation (min): 59 min and 35 min  Patient has met 3 of 3 short term goals.    Pt has made steady progress during this report period and has achieved 3/3 STGs. His midline orientation has improved while standing during LB self care tasks. Static balance has also improved enough for pt to safely sit on TTB to shower (!) and BSC with close supervision. Pts spouse is present during most all ADL sessions and has been active with assisting during functional transfers and self care tasks. Education regarding NMR strategies is ongoing with pt/spouse. Due to pts progress, OT recommends for length of stay to be increased to maximize his independence with self care tasks for safe d/c home with spouse. Continue with POC.   Patient continues to demonstrate the following deficits: muscle weakness and muscle paralysis, decreased cardiorespiratoy endurance, impaired timing and sequencing, abnormal tone, unbalanced muscle activation, motor apraxia, decreased coordination and decreased motor planning, decreased midline orientation, decreased attention to right, right side neglect, decreased motor planning and ideational apraxia, decreased initiation, decreased attention, decreased awareness, decreased problem solving, decreased safety awareness, decreased memory and delayed processing and decreased sitting balance, decreased standing balance, decreased postural control, hemiplegia and decreased balance strategies and therefore will continue to benefit from skilled OT intervention to enhance overall performance with BADL.  Patient progressing toward long term goals..  Continue plan of care.  OT Short Term  Goals Week 2:  OT Short Term Goal 1 (Week 2): Pt will stand with max A of 1 to prepare for LB dressing. OT Short Term Goal 1 - Progress (Week 2): Met OT Short Term Goal 2 (Week 2): Pt will transfer bed to chair with max A of 1 to prepare for a safe transfer to a BSC. OT Short Term Goal 2 - Progress (Week 2): Met OT Short Term Goal 3 (Week 2): Pt will demonstrate improved praxis to don shirt with min A. OT Short Term Goal 3 - Progress (Week 2): Met OT Short Term Goal 4 (Week 2): Pt will attend to R side by using L hand to move R arm across body to prepare for rolling in bed.  Week 3:  OT Short Term Goal 1 (Week 3): Pt will complete toilet transfers with consistent Mod A OT Short Term Goal 2 (Week 3): Pt will don footwear with Mod A OT Short Term Goal 3 (Week 3): Pt will don overhead shirt with supervision   Skilled Therapeutic Interventions/Progress Updates:    Tx focus on functional transfers, midline orientation, and perceptual skills during self care tasks.   Pt greeted in w/c with spouse Butch Penny present. He reported needing to use the restroom. Pt completed squat pivot<drop arm commode over toilet with Mod A to the Rt side. Pt and spouse educated on head/hip relationship during transfer. Pt voiding bowels. While spouse completed pericare, pt stood with OT in front (Max A sit<stand) with improved awareness of midline. Able to keep R UE on grab bar with Min A! After he was finished, he completed squat pivot back to w/c with Max A (Lt side). Provided pt with tactile cues for recognizing up/down/left/right. He still exhibits trouble with body awareness and requires Max cues for scooting each hip back into  w/c. Educated his spouse on this perceptual deficit and ways that she can assist with remediation outside of therapies. Handwashing completed w/c level at sink with focus on Rt/Lt discrimination, Rt NMR, and ability to follow 1 step commands. He was then escorted to dayroom. Had him engage in modified  checkers activity while standing with R UE weightbearing on elevated table. Worked on maintaining balance with unilateral support in prep for increasing independence with toileting/LB dressing tasks. Rt knee supported. He required mod cues for instructions related to moving checkers in certain directions. Pt requiring min-mod cues for midline, though exhibited minimal pushing. Pt with 1 major LOB to Rt side, able to recover with assist from OT, and able to continue standing for additional 5 minutes without issue. He was then escorted back to room and left with spouse.    2nd Session 1:1 tx (35 min) Tx focus on Rt NMR during meaningful activity engagement.   Pt greeted in w/c after lunch. Agreeable to tx. Pt engaging in Rt NMR activity, dancing to [employer] with mirror used for visual feedback. OT also facilitated tapping at biceps and deltoids as appropriate. Worked on reciprocal shoulder movements in gravity eliminated planes, required [facility] for pt to demonstrate proper technique. HOH for body awareness (i.e. distinguishing up/down and left/right with bilateral UEs). He exhibited limited Rt forearm supination. Discussed this with pt/spouse and educated them to work on gentle ROM in pain free range to help with increase ROM. At end of session pt was left in w/c with spouse present.    Therapy Documentation Precautions:  Precautions Precautions: Fall Precaution Comments: right sided weakness, right inattention Restrictions Weight Bearing Restrictions: No :   Pain: No c/o pain during tx    ADL:     See Function Navigator for Current Functional Status.   Therapy/Group: Individual Therapy  Danyla Wattley A Lorrayne Ismael 10/01/2017, 4:28 PM

## 2017-10-01 NOTE — Plan of Care (Signed)
Pt taking tylenol for increased tone.

## 2017-10-01 NOTE — Progress Notes (Signed)
Speech Language Pathology Weekly Progress and Session Note  Patient Details  Name: Jaime Pierce MRN: [id number] Date of Birth: 23-Apr-1946  Beginning of progress report period: September 24, 2017 End of progress report period: October 01, 2017  Today's Date: 10/01/2017 SLP Individual Time: 0820-0900 SLP Individual Time Calculation (min): 40 min  Short Term Goals: Week 3: SLP Short Term Goal 1 (Week 3): Patient will recall new, daily information with Min A multimodal cues.  SLP Short Term Goal 1 - Progress (Week 3): Not met SLP Short Term Goal 2 (Week 3): Patient will demonstrate functional problem solving for basic and familiar tasks with Min A verbal cues.  SLP Short Term Goal 2 - Progress (Week 3): Met SLP Short Term Goal 3 (Week 3): Patient will follow 2 step directions with 75% accuracy and Min A verbal cues.  SLP Short Term Goal 3 - Progress (Week 3): Met SLP Short Term Goal 4 (Week 3): Patient will demonstrate selective attention in a mildly distracting enviornment  to functional tasks for ~60 minutes with Mod I.  SLP Short Term Goal 4 - Progress (Week 3): Met SLP Short Term Goal 5 (Week 3): Patient will verbally express mildly abstract thoughts at the phrase and sentence level with superivsion verbal cues.  SLP Short Term Goal 5 - Progress (Week 3): Met SLP Short Term Goal 6 (Week 3): Patient will demonstrate reading comprehension at the phrase level with Min A verbal cues.  SLP Short Term Goal 6 - Progress (Week 3): Met    New Short Term Goals: Week 4: SLP Short Term Goal 1 (Week 4): Patient will recall new, daily information with Min A multimodal cues.  SLP Short Term Goal 2 (Week 4): Patient will follow 2 step directions with 75% accuracy and supervision verbal cues.  SLP Short Term Goal 3 (Week 4): Patient will verbally express mildly abstract thoughts at the phrase and sentence level with Mod I.  SLP Short Term Goal 4 (Week 4): Patient will demonstrate functional problem  solving for mildly complex tasks with supervision verbal cues.  SLP Short Term Goal 5 (Week 4): Patient will self-monitor and correct errors during functional tasks with Min A verbal cues.   Weekly Progress Updates: Patient has made functional gains and has met 5 of 6 STG's this reporting period. Currently, patient is overall Mod I for basic auditory comprehension, basic reading comprehension and basic verbal expression. However, patient requires increased cueing for verbal expression for complex, abstract thoughts and multi-step directions. Patient also overall Mod I-supervision for selective attention and basic problem solving but continues to require Min-Mod verbal cues for recall of new information. Patient would benefit from continued skilled SLP intervention to maximize her cognitive-linguistic function and overall functional independence prior to discharge.      Intensity: Minumum of 1-2 x/day, 30 to 90 minutes Frequency: 3 to 5 out of 7 days Duration/Length of Stay: 11/16 Treatment/Interventions: Cognitive remediation/compensation;Cueing hierarchy;Functional tasks;Patient/family education;Therapeutic Activities;Environmental controls;Internal/external aids;Speech/Language facilitation   Daily Session  Skilled Therapeutic Interventions: Skilled treatment session focused on cognitive goals. Patient completed a basic money management task and basic mathematical problems with Mod I for problem solving and selective attention in a mildly distracting environment. Patient left upright in the wheelchair with all needs within reach. Continue with current plan of care.       Function:   Cognition Comprehension Comprehension assist level: Follows basic conversation/direction with extra time/assistive device  Expression   Expression assist level: Expresses basic 90% of the  time/requires cueing < 10% of the time.  Social Interaction Social Interaction assist level: Interacts appropriately 90% of  the time - Needs monitoring or encouragement for participation or interaction.  Problem Solving Problem solving assist level: Solves basic 90% of the time/requires cueing < 10% of the time  Memory Memory assist level: Recognizes or recalls 50 - 74% of the time/requires cueing 25 - 49% of the time   Pain No/Denies Pain   Therapy/Group: Individual Therapy  Willean Schurman, [place] 10/01/2017, 11:17 AM

## 2017-10-02 ENCOUNTER — Inpatient Hospital Stay (HOSPITAL_COMMUNITY): Payer: Medicare HMO

## 2017-10-02 NOTE — Progress Notes (Deleted)
10/02/17 1503 nursing To Rehab alert and oriented patient accompanied by RN. Noted dressing to left  lower extremity dry and intact per patient it was done by PA this am. Oriented to unit set up. Fall prevention sheet done and signed by patient.

## 2017-10-02 NOTE — Progress Notes (Signed)
Occupational Therapy Session Note  Patient Details  Name: Jaime Pierce MRN: [id number] Date of Birth: 1946-09-16  Today's Date: 10/02/2017 OT Individual Time: 1500-1545 OT Individual Time Calculation (min): 45 min    Short Term Goals: Week 3:  OT Short Term Goal 1 (Week 3): Pt will complete toilet transfers with consistent Mod A OT Short Term Goal 2 (Week 3): Pt will don footwear with Mod A OT Short Term Goal 3 (Week 3): Pt will don overhead shirt with supervision   Skilled Therapeutic Interventions/Progress Updates:    1:1. Pt and wife present upon entering room. Throughout session pt completes 5 sit to stand transitions with MAX A fading to MOD A for lifting with VC for leaning trunk over L knee to break up pushing pattern. Pt stand to complete various activities (i.e towel glides for weight bearing through RUE and L/R discrimination and learning new game of dominoes) with min A overall for balance, R knee blocking and VC for weight shifting laterally/extending R knee. Exited session with pt seated in w/c with call light in reach and all needs met.  Therapy Documentation Precautions:  Precautions Precautions: Fall Precaution Comments: right sided weakness, right inattention Restrictions Weight Bearing Restrictions: No General: See Function Navigator for Current Functional Status.   Therapy/Group: Individual Therapy    Tonny Branch 10/02/2017, 6:44 AM

## 2017-10-02 NOTE — Progress Notes (Signed)
Jaime Pierce PHYSICAL MEDICINE & REHABILITATION     PROGRESS NOTE   Subjective/Complaints: Wife is pleased with progress  , starting to move right arm better, denies knee pain today  ROS: +Right knee pain. Denies nausea, vomiting, diarrhea, shortness of breath or chest pain   Objective: Vital Signs: Blood pressure 135/70, pulse 82, temperature 97.9 F (36.6 C), temperature source Oral, resp. rate 18, height 6\' 1"  (1.854 m), weight 84.6 kg (186 lb 9.6 oz), SpO2 99 %. Dg Knee 1-2 Views Right  Result Date: 10/01/2017 CLINICAL DATA:  Twisting right knee injury last night. EXAM: RIGHT KNEE - 1-2 VIEW COMPARISON:  None. FINDINGS: Mild degenerate change of the patellofemoral joint. Chondrocalcinosis over the mediolateral compartments. Suggestion of a small joint effusion. No evidence of fracture or dislocation. Surgical clips over the posteromedial soft tissues. IMPRESSION: No acute fracture.  Possible small joint effusion. Mild degenerate change of the patellofemoral joint. Chondrocalcinosis. Electronically Signed   By: Elberta Fortisaniel  Boyle M.D.   On: 10/01/2017 11:01   No results for input(s): WBC, HGB, HCT, PLT in the last 72 hours. No results for input(s): NA, K, CL, GLUCOSE, BUN, CREATININE, CALCIUM in the last 72 hours.  Invalid input(s): CO CBG (last 3)  No results for input(s): GLUCAP in the last 72 hours.  Wt Readings from Last 3 Encounters:  09/29/17 84.6 kg (186 lb 9.6 oz)  09/01/17 85.1 kg (187 lb 9.8 oz)  06/30/17 83 kg (183 lb)    Physical Exam:  Constitutional: He appears well-developed. NAD. HENT: Normocephalic. Atraumatic. Eyes: EOMare normal. No discharge. Cardiovascular: RRR. No JVD    Respiratory: CTA Bilaterally. Normal effort          GI: BS +, non-distended.  MSK: Edema right knee with mild tenderness Neurological: He is alertand oriented.  Motor: LUE/LLE: 5/5 proximal to distal  RUE: 2-/5 . Right biceps and right shoulder adductor RLE: 3/5 HF, 3/5 KE and ankle  PF/DF.    Skin.warm and dry.  Psych: Normal mood and behavior. , Speech is sparse, but has good comprehension and answers in full sentences  Assessment/Plan: 1. 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 Physiatrist is providing close team supervision and 24 hour management of active medical problems listed below. Physiatrist and rehab team continue to assess barriers to discharge/monitor patient progress toward functional and medical goals.  Function:  Bathing Bathing position   Position: Shower  Bathing parts Body parts bathed by patient: Chest, Abdomen, Front perineal area, Right upper leg, Left upper leg, Buttocks Body parts bathed by helper: Right arm, Left arm, Right lower leg, Left lower leg, Back  Bathing assist Assist Level: (mod a)      Upper Body Dressing/Undressing Upper body dressing   What is the patient wearing?: Pull over shirt/dress     Pull over shirt/dress - Perfomed by patient: Thread/unthread left sleeve, Put head through opening, Pull shirt over trunk Pull over shirt/dress - Perfomed by helper: Thread/unthread right sleeve        Upper body assist Assist Level: (min A)      Lower Body Dressing/Undressing Lower body dressing   What is the patient wearing?: Pants, Socks, Shoes   Underwear - Performed by helper: Thread/unthread right underwear leg, Thread/unthread left underwear leg, Pull underwear up/down Pants- Performed by patient: Pull pants up/down, Thread/unthread left pants leg Pants- Performed by helper: Thread/unthread right pants leg  Non-skid slipper socks- Performed by helper: Don/doff right sock, Don/doff left sock   Socks - Performed by helper: Don/doff right sock, Don/doff left sock   Shoes - Performed by helper: Don/doff right shoe, Don/doff left shoe, Fasten right, Fasten left          Lower  body assist Assist for lower body dressing: (max A)      Toileting Toileting Toileting activity did not occur: Safety/medical concerns   Toileting steps completed by helper: Adjust clothing prior to toileting, Performs perineal hygiene, Adjust clothing after toileting Toileting Assistive Devices: Grab bar or rail  Toileting assist Assist level: Two helpers   Transfers Chair/bed transfer   Chair/bed transfer method: Stand pivot Chair/bed transfer assist level: Maximal assist (Pt 25 - 49%/lift and lower) Chair/bed transfer assistive device: Armrests Mechanical lift: Stedy   Locomotion Ambulation Ambulation activity did not occur: Safety/medical concerns   Max distance: 15 Assist level: 2 helpers   Wheelchair   Type: Manual Max wheelchair distance: 25 Assist Level: Moderate assistance (Pt 50 - 74%)  Cognition Comprehension Comprehension assist level: Follows basic conversation/direction with extra time/assistive device  Expression Expression assist level: Expresses basic 90% of the time/requires cueing < 10% of the time.  Social Interaction Social Interaction assist level: Interacts appropriately 90% of the time - Needs monitoring or encouragement for participation or interaction.  Problem Solving Problem solving assist level: Solves basic 90% of the time/requires cueing < 10% of the time  Memory Memory assist level: Recognizes or recalls 50 - 74% of the time/requires cueing 25 - 49% of the time   Medical Problem List and Plan: 1. 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   2. DVT Prophylaxis/Anticoagulation: SCDs. Monitor for any signs of DVT  -Dopplers negative for DVT 3. Pain Management/spasticity:   - Continue baclofen 20 mg 3 times daily-has responded well to this so far  -PRAFO RLE  -ROM with therapy, family  -may be a botox candidate for RUE (pecs)  as outpt although tone is improving.  4. Mood: Provide emotional support 5. Neuropsych: This patient iscapable of making decisions on hisown behalf. 6. Skin/Wound Care: rash resolved   -keep skin dry 7. Fluids/Electrolytes/Nutrition:     -recheck bmet on Monday 8.CAD with CABG.No chest pain or shortness of breath 9. Hypertension. Lopressor 25 mg daily   Vitals:   10/02/17 0543 10/02/17 0858  BP: 135/70   Pulse: 82   Resp: 18   Temp: 97.9 F (36.6 C)   SpO2: 95% 99%   10.COPD/tobacco abuse. Counseling.Continue inhalers  -off oxygen 11. Hyponatremia: Resolved  Sodium 136 on 11/1  12. Mild leukocytosis:     -resolved  -follow up on Monday 13. Right knee pain with edema  Xray showed mild degenerative changes in the patellofemoral joint, possible small effusion  Voltaren gel PRN  LOS (Days) 23 A FACE TO FACE EVALUATION WAS PERFORMED  Erick ColaceKIRSTEINS,ANDREW E, MD 10/02/2017 11:07 AM

## 2017-10-03 ENCOUNTER — Inpatient Hospital Stay (HOSPITAL_COMMUNITY): Payer: Medicare HMO | Admitting: Occupational Therapy

## 2017-10-03 NOTE — Plan of Care (Signed)
  RH BOWEL ELIMINATION RH STG MANAGE BOWEL WITH ASSISTANCE Description STG Manage Bowel with min Assistance.   10/03/2017 0459 - Progressing by Martina SinnerMurray, Yeraldi Fidler A, RN   RH BOWEL ELIMINATION RH STG MANAGE BOWEL W/MEDICATION W/ASSISTANCE Description STG Manage Bowel with Medication with min Assistance.   10/03/2017 0459 - Progressing by Martina SinnerMurray, Ayden Hardwick A, RN   RH BLADDER ELIMINATION RH STG MANAGE BLADDER WITH ASSISTANCE Description STG Manage Bladder With min Assistance   10/03/2017 0459 - Progressing by Martina SinnerMurray, Chesley Valls A, RN   RH SKIN INTEGRITY RH STG SKIN FREE OF INFECTION/BREAKDOWN Description Prevent skin breakdown or infection with mod assist  10/03/2017 0459 - Progressing by Martina SinnerMurray, Haylie Mccutcheon A, RN   [facility] Description See Patient Education module for education specifics   10/03/2017 0459 - Progressing by Martina SinnerMurray, Kallee Nam A, RN

## 2017-10-03 NOTE — Plan of Care (Signed)
  Progressing Consults RH STROKE PATIENT EDUCATION Description See Patient Education module for education specifics   10/03/2017 2314 - Progressing by Martina SinnerMurray, Deitra Craine A, RN RH BOWEL ELIMINATION RH STG MANAGE BOWEL WITH ASSISTANCE Description STG Manage Bowel with min Assistance.   10/03/2017 2314 - Progressing by Martina SinnerMurray, Alexio Sroka A, RN RH STG MANAGE BOWEL W/MEDICATION W/ASSISTANCE Description STG Manage Bowel with Medication with min Assistance.   10/03/2017 2314 - Progressing by Martina SinnerMurray, Travante Knee A, RN RH SKIN INTEGRITY RH STG SKIN FREE OF INFECTION/BREAKDOWN Description Prevent skin breakdown or infection with mod assist  10/03/2017 2314 - Progressing by Martina SinnerMurray, Treva Huyett A, RN RH STG MAINTAIN SKIN INTEGRITY WITH ASSISTANCE Description STG Maintain Skin Integrity With min Assistance.   10/03/2017 2314 - Progressing by Martina SinnerMurray, Dayleen Beske A, RN RH SAFETY RH STG ADHERE TO SAFETY PRECAUTIONS W/ASSISTANCE/DEVICE Description STG Adhere to Safety Precautions With mod I Assistance/Device.   10/03/2017 2314 - Progressing by Martina SinnerMurray, Savina Olshefski A, RN RH PAIN MANAGEMENT RH STG PAIN MANAGED AT OR BELOW PT'S PAIN GOAL Description Less than 3 out of 10  10/03/2017 2314 - Progressing by Martina SinnerMurray, [place] Vanaken A, RN

## 2017-10-03 NOTE — Progress Notes (Addendum)
Occupational Therapy Session Note  Patient Details  Name: Jaime Pierce MRN: [id number] Date of Birth: 03-Nov-1946  Today's Date: 10/03/2017 OT Individual Time: 1112-1212 OT Individual Time Calculation (min): 60 min   Skilled Therapeutic Interventions/Progress Updates:    Tx focus on NMR, apraxia, and pt/family education during dressing tasks.   Pt greeted in w/c with spouse present. Already bathed, dressed, and toileted. Ready to go. Worked on Allied Waste Industriesdonning/doffing footwear together. Pt able to achieve figure 4 position with L LE to doff/don socks and shoes. He required extra time to self organize and problem solve with mod questioning and instructional cues provided. Increased challenge with the R LE, though pt able to dorsiflex foot while OT removed shoe (on adaptive footstool). Able to don the Rt sock with instruction from OT on adaptive hemi technique! Plantarflexion for donning shoe with tactile cues. At this time, seated figure 4 was too painful to tolerate with R LE. Transitioned to praxis remediation via practicing donning overhead shirt. Pt still with decreased ability to orient his shirt or identify parts of shirt without max questioning/instructional cues. Step by step instruction for carryover of Rt hemi techniques. Continued working on Rt/Lt discrimination, as this became more confusing to him with adding Rt/Lt shirt components. Per spouse "looks like we have some homework for the afternoon." Pt left with spouse at time of departure.   Also issued him elastic shoe laces.   Therapy Documentation Precautions:  Precautions Precautions: Fall Precaution Comments: right sided weakness, right inattention Restrictions Weight Bearing Restrictions: No Pain: Pain Assessment Pain Score: 0-No pain ADL:  :    See Function Navigator for Current Functional Status.   Therapy/Group: Individual Therapy  Avionna Bower A Shamera Yarberry 10/03/2017, 12:47 PM

## 2017-10-03 NOTE — Progress Notes (Signed)
[place] PHYSICAL MEDICINE & REHABILITATION     PROGRESS NOTE   Subjective/Complaints: Patient and wife have been practicing upper extremity movement on the right side. He denies any pain in the shoulder at current time but sometimes has this with movement. States that with further movement he does have a reduction of pain again  ROS: +Right knee pain. Denies nausea, vomiting, diarrhea, shortness of breath or chest pain   Objective: Vital Signs: Blood pressure 138/78, pulse 81, temperature 97.6 F (36.4 C), temperature source Oral, resp. rate 18, height 6\' 1"  (1.854 m), weight 84.6 kg (186 lb 9.6 oz), SpO2 95 %. No results found. No results for input(s): WBC, HGB, HCT, PLT in the last 72 hours. No results for input(s): NA, K, CL, GLUCOSE, BUN, CREATININE, CALCIUM in the last 72 hours.  Invalid input(s): CO CBG (last 3)  No results for input(s): GLUCAP in the last 72 hours.  Wt Readings from Last 3 Encounters:  09/29/17 84.6 kg (186 lb 9.6 oz)  09/01/17 85.1 kg (187 lb 9.8 oz)  06/30/17 83 kg (183 lb)    Physical Exam:  Constitutional: He appears well-developed. NAD. HENT: Normocephalic. Atraumatic. Eyes: EOMare normal. No discharge. Cardiovascular: RRR. No JVD    Respiratory: CTA Bilaterally. Normal effort          GI: BS +, non-distended.  MSK: Edema right knee with mild tenderness Neurological: He is alertand oriented.  Motor: LUE/LLE: 5/5 proximal to distal  RUE: 2-/5 . Right biceps and right shoulder adductor RLE: 3/5 HF, 3/5 KE and ankle PF/DF.    Skin.warm and dry.  Psych: Normal mood and behavior. , Speech is sparse, but has good comprehension and answers in full sentences  Assessment/Plan: 1. 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 Physiatrist is providing close team supervision and 24 hour management of active medical  problems listed below. Physiatrist and rehab team continue to assess barriers to discharge/monitor patient progress toward functional and medical goals.  Function:  Bathing Bathing position   Position: Shower  Bathing parts Body parts bathed by patient: Chest, Abdomen, Front perineal area, Right upper leg, Left upper leg, Buttocks Body parts bathed by helper: Right arm, Left arm, Right lower leg, Left lower leg, Back  Bathing assist Assist Level: (mod a)      Upper Body Dressing/Undressing Upper body dressing   What is the patient wearing?: Pull over shirt/dress     Pull over shirt/dress - Perfomed by patient: Thread/unthread left sleeve, Put head through opening, Pull shirt over trunk Pull over shirt/dress - Perfomed by helper: Thread/unthread right sleeve        Upper body assist Assist Level: (min A)      Lower Body Dressing/Undressing Lower body dressing   What is the patient wearing?: Pants, Socks, Shoes   Underwear - Performed by helper: Thread/unthread right underwear leg, Thread/unthread left underwear leg, Pull underwear up/down Pants- Performed by patient: Pull pants up/down, Thread/unthread left pants leg Pants- Performed by helper: Thread/unthread right pants leg   Non-skid slipper socks- Performed by helper: Don/doff right sock, Don/doff left sock   Socks - Performed by helper: Don/doff right sock, Don/doff left sock   Shoes - Performed by helper: Don/doff right shoe, Don/doff left shoe, Fasten right, Fasten left          Lower body assist Assist for lower body dressing: (max A)  Toileting Toileting Toileting activity did not occur: Safety/medical concerns   Toileting steps completed by helper: Adjust clothing prior to toileting, Performs perineal hygiene, Adjust clothing after toileting Toileting Assistive Devices: Grab bar or rail  Toileting assist Assist level: Two helpers   Transfers Chair/bed transfer   Chair/bed transfer method: Stand  pivot Chair/bed transfer assist level: Maximal assist (Pt 25 - 49%/lift and lower) Chair/bed transfer assistive device: Armrests Mechanical lift: Stedy   Locomotion Ambulation Ambulation activity did not occur: Safety/medical concerns   Max distance: 15 Assist level: 2 helpers   Wheelchair   Type: Manual Max wheelchair distance: 25 Assist Level: Moderate assistance (Pt 50 - 74%)  Cognition Comprehension Comprehension assist level: Follows basic conversation/direction with extra time/assistive device  Expression Expression assist level: Expresses basic 90% of the time/requires cueing < 10% of the time.  Social Interaction Social Interaction assist level: Interacts appropriately 90% of the time - Needs monitoring or encouragement for participation or interaction.  Problem Solving Problem solving assist level: Solves basic 90% of the time/requires cueing < 10% of the time  Memory Memory assist level: Recognizes or recalls 50 - 74% of the time/requires cueing 25 - 49% of the time   Medical Problem List and Plan: 1. 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   2. DVT Prophylaxis/Anticoagulation: SCDs. Monitor for any signs of DVT  -Dopplers negative for DVT 3. Pain Management/spasticity:   - Continue baclofen 20 mg 3 times daily-has responded well to this so far  -PRAFO RLE  -ROM with therapy, family  -may be a botox candidate for RUE (pecs) as outpt although tone is improving.  4. Mood: Provide emotional support 5. Neuropsych: This patient iscapable of making decisions on hisown behalf. 6. Skin/Wound Care: rash resolved   -keep skin dry 7. Fluids/Electrolytes/Nutrition:     -recheck bmet on Monday 8.CAD with CABG.No chest pain or shortness of breath 9. Hypertension. Lopressor 25 mg daily   Vitals:   10/03/17 0500 10/03/17 0744  BP: (!) 147/78 138/78   Pulse: 81   Resp: 18   Temp: 97.6 F (36.4 C)   SpO2: 95%    10.COPD/tobacco abuse. Counseling.Continue inhalers  -off oxygen 11. Hyponatremia: Resolved  Sodium 136 on 11/1  12. Mild leukocytosis:     -resolved  -follow up on Monday 13. Right knee pain with edema  Xray showed mild degenerative changes in the patellofemoral joint, possible small effusion  Voltaren gel PRN  LOS (Days) 24 A FACE TO FACE EVALUATION WAS PERFORMED  Erick ColaceKIRSTEINS,Kaytelynn Scripter E, MD 10/03/2017 11:21 AM

## 2017-10-04 ENCOUNTER — Inpatient Hospital Stay (HOSPITAL_COMMUNITY): Payer: Medicare HMO

## 2017-10-04 ENCOUNTER — Inpatient Hospital Stay (HOSPITAL_COMMUNITY): Payer: Medicare HMO | Admitting: Occupational Therapy

## 2017-10-04 ENCOUNTER — Inpatient Hospital Stay (HOSPITAL_COMMUNITY): Payer: Medicare HMO | Admitting: Physical Therapy

## 2017-10-04 DIAGNOSIS — I612 Nontraumatic intracerebral hemorrhage in hemisphere, unspecified: Secondary | ICD-10-CM

## 2017-10-04 NOTE — Progress Notes (Signed)
Occupational Therapy Session Note  Patient Details  Name: Jaime Pierce MRN: [id number] Date of Birth: 1946-06-01  Today's Date: 10/04/2017 OT Individual Time: [id number] OT Individual Time Calculation (min): 74 min    Short Term Goals: Week 3:  OT Short Term Goal 1 (Week 3): Pt will complete toilet transfers with consistent Mod A OT Short Term Goal 2 (Week 3): Pt will don footwear with Mod A OT Short Term Goal 3 (Week 3): Pt will don overhead shirt with supervision   Skilled Therapeutic Interventions/Progress Updates:    Treatment session focused on self care/ADL training, transfer training, NMR, pt/caregiver education.   Upon entering pt supine in bed with wife at bedside. Agreeable to AM ADLs. Pt completed bed mob with mod A from supine to EOB sit. Performed sit<>stand transfers with stedy with max A fading to min A with HOH facilitation for R UE function. Transferred to toilet for BM with stedy, v/c for hand/foot placement. Used Stedy to transfer into shower on shower bench, completed bathing UB with min A for washing L LE and obtaining items. Pt required max A for LB washing d/t unsteadiness. Perineal hygiene completed with max A standing in shower with grab bars for support. Pt completed transfer to w/c with stedy and mod A. Therapist instructed pt on hemi dressing techniques, pt demo'ed confusion of sequencing and problem solving task and required mod A for threading R UE. Pt completed LB dressing with max A for looping pants to R LE. Difficulty with figure 4 dressing technique. Instructed on donning sock with hemi technique, noted difficulty and required assistance to minimize frustration. Pt completed sit<>stand transfer training using stedy and standing tolerance task while maintaining upright posture and min A. Pt returned to w/c and left to visit with his wife. No complaints of pain .   Therapy Documentation Precautions:  Precautions Precautions: Fall Precaution Comments:  right sided weakness, right inattention Restrictions Weight Bearing Restrictions: No General:   Vital Signs: Oxygen Therapy SpO2: 91 % O2 Device: Not Delivered Pain: Pain Assessment Pain Assessment: 0-10 Pain Score: 1 (premedicated for therapy) Pain Type: Acute pain Pain Location: Knee Pain Orientation: Right Pain Intervention(s): Medication (See eMAR) ADL:   Vision   Perception    Praxis   Exercises:   Other Treatments:    See Function Navigator for Current Functional Status.   Therapy/Group: Individual Therapy  Verdie ShireMolly  Cristian Davitt 10/04/2017, 12:05 PM

## 2017-10-04 NOTE — Progress Notes (Signed)
[place] PHYSICAL MEDICINE & REHABILITATION     PROGRESS NOTE   Subjective/Complaints: Pt seen laying in bed this AM about to work with therapies.  He slept well overnight.  Wife at bedside, who reports improvement with Voltaren gel.    ROS: Denies nausea, vomiting, diarrhea, shortness of breath or chest pain   Objective: Vital Signs: Blood pressure (!) 141/70, pulse 76, temperature 97.7 F (36.5 C), temperature source Oral, resp. rate 16, height 6\' 1"  (1.854 m), weight 84.6 kg (186 lb 9.6 oz), SpO2 91 %. No results found. No results for input(s): WBC, HGB, HCT, PLT in the last 72 hours. No results for input(s): NA, K, CL, GLUCOSE, BUN, CREATININE, CALCIUM in the last 72 hours.  Invalid input(s): CO CBG (last 3)  No results for input(s): GLUCAP in the last 72 hours.  Wt Readings from Last 3 Encounters:  09/29/17 84.6 kg (186 lb 9.6 oz)  09/01/17 85.1 kg (187 lb 9.8 oz)  06/30/17 83 kg (183 lb)    Physical Exam:  Constitutional: He appears well-developed. NAD. HENT: Normocephalic. Atraumatic. Eyes: EOMare normal. No discharge. Cardiovascular: RRR. No JVD    Respiratory: CTA Bilaterally. Normal effort          GI: BS +, non-distended.  MSK: Mild edema right knee. No tenderness Neurological: He is alertand oriented.  Motor: LUE/LLE: 5/5 proximal to distal  RUE: 2-/5 . Right biceps and right shoulder adductor, 4-/5 wrist extension mAS 2/4 elbow flexors RLE: 3/5 HF, 3/5 KE and ankle PF/DF.    Skin.warm and dry.  Psych: Normal mood and behavior.   Assessment/Plan: 1. 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 Physiatrist is providing close team supervision and 24 hour management of active medical problems listed below. Physiatrist and rehab team continue to assess barriers to discharge/monitor patient progress toward functional and  medical goals.  Function:  Bathing Bathing position   Position: Shower  Bathing parts Body parts bathed by patient: Chest, Abdomen, Front perineal area, Right upper leg, Left upper leg, Buttocks Body parts bathed by helper: Right arm, Left arm, Right lower leg, Left lower leg, Back  Bathing assist Assist Level: (mod a)      Upper Body Dressing/Undressing Upper body dressing   What is the patient wearing?: Pull over shirt/dress     Pull over shirt/dress - Perfomed by patient: Thread/unthread left sleeve, Put head through opening, Pull shirt over trunk Pull over shirt/dress - Perfomed by helper: Thread/unthread right sleeve        Upper body assist Assist Level: (min A)      Lower Body Dressing/Undressing Lower body dressing   What is the patient wearing?: Pants, Socks, Shoes   Underwear - Performed by helper: Thread/unthread right underwear leg, Thread/unthread left underwear leg, Pull underwear up/down Pants- Performed by patient: Pull pants up/down, Thread/unthread left pants leg Pants- Performed by helper: Thread/unthread right pants leg   Non-skid slipper socks- Performed by helper: Don/doff right sock, Don/doff left sock   Socks - Performed by helper: Don/doff right sock, Don/doff left sock   Shoes - Performed by helper: Don/doff right shoe, Don/doff left shoe, Fasten right, Fasten left          Lower body assist Assist for lower body dressing: (max A)      Toileting Toileting Toileting activity did not occur: Safety/medical concerns   Toileting steps completed by helper: Adjust  clothing prior to toileting, Performs perineal hygiene, Adjust clothing after toileting Toileting Assistive Devices: Grab bar or rail  Toileting assist Assist level: Two helpers   Transfers Chair/bed transfer   Chair/bed transfer method: Stand pivot Chair/bed transfer assist level: Maximal assist (Pt 25 - 49%/lift and lower) Chair/bed transfer assistive device: Armrests Mechanical  lift: Stedy   Locomotion Ambulation Ambulation activity did not occur: Safety/medical concerns   Max distance: 15 Assist level: 2 helpers   Wheelchair   Type: Manual Max wheelchair distance: 25 Assist Level: Moderate assistance (Pt 50 - 74%)  Cognition Comprehension Comprehension assist level: Follows basic conversation/direction with extra time/assistive device  Expression Expression assist level: Expresses basic 90% of the time/requires cueing < 10% of the time.  Social Interaction Social Interaction assist level: Interacts appropriately 90% of the time - Needs monitoring or encouragement for participation or interaction.  Problem Solving Problem solving assist level: Solves basic 90% of the time/requires cueing < 10% of the time  Memory Memory assist level: Recognizes or recalls 50 - 74% of the time/requires cueing 25 - 49% of the time   Medical Problem List and Plan: 1. 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 DVT Prophylaxis/Anticoagulation: SCDs. Monitor for any signs of DVT  -Dopplers negative for DVT 3. Pain Management/spasticity:   - Continue baclofen 20 mg 3 times daily-has responded well to this so far  -PRAFO RLE  -ROM with therapy, family  -may be a botox candidate for RUE (pecs)  4. Mood: Provide emotional support 5. Neuropsych: This patient iscapable of making decisions on hisown behalf. 6. Skin/Wound Care: rash resolved   -keep skin dry 7. Fluids/Electrolytes/Nutrition:     -recheck bmet on pending 8.CAD with CABG.No chest pain or shortness of breath 9. Hypertension. Lopressor 25 mg daily   Vitals:   10/04/17 0612 10/04/17 0917  BP: (!) 141/70   Pulse: 76   Resp: 16   Temp: 97.7 F (36.5 C)   SpO2: 92% 91%    Slightly labile, but overall controlled on 11/12 10.COPD/tobacco abuse. Counseling.Continue inhalers  -off oxygen 11. Hyponatremia:  Resolved  Sodium 136 on 11/1  12. Mild leukocytosis:     -resolved  -follow up on Monday 13. Right knee pain with edema  Xray reviewed, showing mild degenerative changes in the patellofemoral joint  Voltaren gel PRN  LOS (Days) 25 A FACE TO FACE EVALUATION WAS PERFORMED  Ankit Karis JubaAnil Patel, MD 10/04/2017 10:33 AM

## 2017-10-04 NOTE — Progress Notes (Signed)
Speech Language Pathology Daily Session Note  Patient Details  Name: Jaime Pierce MRN: [id number] Date of Birth: 1946/03/21  Today's Date: 10/04/2017 SLP Individual Time: 0930-1030 SLP Individual Time Calculation (min): 60 min  Short Term Goals: Week 4: SLP Short Term Goal 1 (Week 4): Patient will recall new, daily information with Min A multimodal cues.  SLP Short Term Goal 2 (Week 4): Patient will follow 2 step directions with 75% accuracy and supervision verbal cues.  SLP Short Term Goal 3 (Week 4): Patient will verbally express mildly abstract thoughts at the phrase and sentence level with Mod I.  SLP Short Term Goal 4 (Week 4): Patient will demonstrate functional problem solving for mildly complex tasks with supervision verbal cues.  SLP Short Term Goal 5 (Week 4): Patient will self-monitor and correct errors during functional tasks with Min A verbal cues.   Skilled Therapeutic Interventions:Skilled ST services focused on cognitive skills. SLP facilitated following 2 step directions focusing on right and left discrimination of body movements requiring mod fading to min A verbal and visual aid cues. Pt demonstrated ability to answer daily math problem from ALFA with min A verbal cues during semi-complex problem solving task. SLP facilitated expressing abstract thought at phrase and sentence level utilizing word deduction, analogies and explanation of proverbs tasks requiring min A verbal cues. Pt was left in room with wife. Recommend to continue skilled ST services.         Function:  Eating Eating                 Cognition Comprehension Comprehension assist level: Follows basic conversation/direction with extra time/assistive device  Expression   Expression assist level: Expresses basic 90% of the time/requires cueing < 10% of the time.  Social Interaction Social Interaction assist level: Interacts appropriately 90% of the time - Needs monitoring or encouragement for  participation or interaction.  Problem Solving Problem solving assist level: Solves basic 90% of the time/requires cueing < 10% of the time  Memory Memory assist level: Recognizes or recalls 50 - 74% of the time/requires cueing 25 - 49% of the time    Pain Pain Assessment Pain Assessment: No/denies pain Pain Score: 1 (premedicated for therapy) Pain Type: Acute pain Pain Location: Knee Pain Orientation: Right Pain Intervention(s): Medication (See eMAR)  Therapy/Group: Individual Therapy  Jaime Pierce  [facility] IncCRATCH 10/04/2017, 12:55 PM

## 2017-10-04 NOTE — Progress Notes (Signed)
Physical Therapy Session Note  Patient Details  Name: Jaime Pierce MRN: [id number] Date of Birth: [birth year]  Today's Date: 10/04/2017 PT Individual Time: 1300-1400 PT Individual Time Calculation (min): 60 min   Short Term Goals: Week 4:  PT Short Term Goal 1 (Week 4): Pt will demonstrate transfers w/c <>bed with minA PT Short Term Goal 2 (Week 4): Pt will ambulate x20' with consistent minA PT Short Term Goal 3 (Week 4): Pt will perform bed mobility with S on flat bed in ADL apartment PT Short Term Goal 4 (Week 4): Pt will demonstrate sit <>stand with consistent modA  Skilled Therapeutic Interventions/Progress Updates: Pt received in w/c, denies pain and agreeable to treatment. Three muskateers assist to get on/off table with one LE in tall kneeling, other LE off side of mat table. Weight shifts on/off LE on mat in tall kneeling for focus on R stance control/hip extension when RLE on table, L weight shifts and contralateral stance when LLE on table. Requires repetitive cueing for R hip extension in stance while in tall kneeling, and demos improved RLE stance control when performing LLE tall kneeling weight shifts. Mirror used for visual feedback of posture and midline alignment. Standing L weight shifts with mat oriented on L side of pt to provide end point for L weight shifts while reaching with LUE for horseshoe held by pt's wife; performed to facilitate L weight shift and desensitization. Gait with standard walker x15' min/modA +2 with mirror for feedback; slow speed and ongoing cueing for weight shifting, assist to reduce RLE scissoring. Returned to room in w/c totalA by wife at end of session.     Therapy Documentation Precautions:  Precautions Precautions: Fall Precaution Comments: right sided weakness, right inattention Restrictions Weight Bearing Restrictions: No   See Function Navigator for Current Functional Status.   Therapy/Group: Individual Therapy  Vista Lawmanlizabeth J  Tygielski 10/04/2017, 3:41 PM

## 2017-10-04 NOTE — Progress Notes (Signed)
Nutrition Follow-up  DOCUMENTATION CODES:   Not applicable  INTERVENTION:  Continue Ensure Enlive po BID, each supplement provides 350 kcal and 20 grams of protein  Encourage adequate PO intake.    NUTRITION DIAGNOSIS:   Inadequate oral intake related to poor appetite as evidenced by per patient/family report; improved  GOAL:   Patient will meet greater than or equal to 90% of their needs; met  MONITOR:   PO intake, Supplement acceptance, Labs, Weight trends, Skin, I & O's  REASON FOR ASSESSMENT:   Malnutrition Screening Tool    ASSESSMENT:   71 y.o.right handed male with history of CAD with CABG, hypertension, tobacco abuse. Presented 09/01/2017 with acute onset of right sided weakness and facial droop. CT/MRI showed a 4.2 x 2.5 x 3.1 cm left parietal lobe intraparenchymal hematoma. Small-volume acute subarachnoid hemorrhage was in the adjacent left parietal lobe as well as at the anterior right frontal lobe.   Meal completion has been 50-100% with most intake >/= 60%. Pt currently has Ensure ordered and has been consuming them. RD to continue with current orders to aid in adequate nutrition.   Diet Order:  Diet Heart Room service appropriate? Yes; Fluid consistency: Thin  EDUCATION NEEDS:   No education needs identified at this time  Skin:  Skin Assessment: Reviewed RN Assessment  Last BM:  11/11  Height:   Ht Readings from Last 1 Encounters:  09/09/17 6' 1" (1.854 m)    Weight:   Wt Readings from Last 1 Encounters:  09/29/17 186 lb 9.6 oz (84.6 kg)    Ideal Body Weight:  83.6 kg  BMI:  Body mass index is 24.62 kg/m.  Estimated Nutritional Needs:   Kcal:  2000-2200  Protein:  90-100 grams  Fluid:  2-2.2 L/day    Corrin Parker, MS, RD, LDN Pager # [phone] After hours/ weekend pager # [phone]

## 2017-10-05 ENCOUNTER — Inpatient Hospital Stay (HOSPITAL_COMMUNITY): Payer: Medicare HMO | Admitting: Speech Pathology

## 2017-10-05 ENCOUNTER — Inpatient Hospital Stay (HOSPITAL_COMMUNITY): Payer: Medicare HMO | Admitting: Occupational Therapy

## 2017-10-05 ENCOUNTER — Inpatient Hospital Stay (HOSPITAL_COMMUNITY): Payer: Medicare HMO

## 2017-10-05 ENCOUNTER — Inpatient Hospital Stay (HOSPITAL_COMMUNITY): Payer: Medicare HMO | Admitting: Physical Therapy

## 2017-10-05 DIAGNOSIS — R059 Cough, unspecified: Secondary | ICD-10-CM

## 2017-10-05 DIAGNOSIS — R05 Cough: Secondary | ICD-10-CM

## 2017-10-05 NOTE — Progress Notes (Signed)
Occupational Therapy Session Note  Patient Details  Name: Jaime Pierce MRN: [id number] Date of Birth: 07/09/1946  Today's Date: 10/05/2017 OT Individual Time: 1300-1345 OT Individual Time Calculation (min): 45 min    Short Term Goals: Week 3:  OT Short Term Goal 1 (Week 3): Pt will complete toilet transfers with consistent Mod A OT Short Term Goal 2 (Week 3): Pt will don footwear with Mod A OT Short Term Goal 3 (Week 3): Pt will don overhead shirt with supervision   Skilled Therapeutic Interventions/Progress Updates:    1:1 NMES applied to wrist flexors to facilitate gross grasp movements and strength. E-stim coupled with bicep/tricep flexion and extension and then pronation/supination. Success with increased gross grasping abilities with e-stim applied, pt able to grasp/ release foam block with VCs to coordinate with alternating stimulation. Provided education to pt and pt's wife regarding purpose and benefits of e-stim. No adverse reactions after treatment and is skin intact.  Sitting EOM, completed AAROM stretching to pecs and shoulder, increased shoulder tightness noted as well as in pronator/supinators. Provided education and demonstration for stretches to do btwn sessions and positioning in bed to facilitate opening up of shoulder. Completed x2 step/stand pivot transfers w/c <> mat, +2 available for safety with assist and multimodal cuing for progression of R LE.   [name] returned him to room in w/c at end of session.      Therapy Documentation Precautions:  Precautions Precautions: Fall Precaution Comments: right sided weakness, right inattention Restrictions Weight Bearing Restrictions: No Pain:   No/ denies pain  See Function Navigator for Current Functional Status.   Therapy/Group: Individual Therapy  Lewis, Feleica Fulmore C 10/05/2017, 7:21 AM

## 2017-10-05 NOTE — Progress Notes (Signed)
Speech Language Pathology Daily Session Note  Patient Details  Name: Jaime Pierce MRN: [id number] Date of Birth: 1946-03-25  Today's Date: 10/05/2017 SLP Individual Time: 0730-0830 SLP Individual Time Calculation (min): 60 min  Short Term Goals: Week 4: SLP Short Term Goal 1 (Week 4): Patient will recall new, daily information with Min A multimodal cues.  SLP Short Term Goal 2 (Week 4): Patient will follow 2 step directions with 75% accuracy and supervision verbal cues.  SLP Short Term Goal 3 (Week 4): Patient will verbally express mildly abstract thoughts at the phrase and sentence level with Mod I.  SLP Short Term Goal 4 (Week 4): Patient will demonstrate functional problem solving for mildly complex tasks with supervision verbal cues.  SLP Short Term Goal 5 (Week 4): Patient will self-monitor and correct errors during functional tasks with Min A verbal cues.   Skilled Therapeutic Interventions: Skilled treatment session focused on cognitive goals. SLP facilitated session by providing Min A verbal cues for problem solving and mental flexibility during a mildly complex money management task. Patient left upright in wheelchair with all needs within reach and wife present. Continue with current plan of care.      Function:   Cognition Comprehension Comprehension assist level: Follows basic conversation/direction with extra time/assistive device  Expression   Expression assist level: Expresses basic 90% of the time/requires cueing < 10% of the time.  Social Interaction Social Interaction assist level: Interacts appropriately 90% of the time - Needs monitoring or encouragement for participation or interaction.  Problem Solving Problem solving assist level: Solves basic 90% of the time/requires cueing < 10% of the time  Memory Memory assist level: Recognizes or recalls 50 - 74% of the time/requires cueing 25 - 49% of the time    Pain No/Denies Pain   Therapy/Group: Individual  Therapy  Jaime Pierce 10/05/2017, 11:22 AM

## 2017-10-05 NOTE — Patient Care Conference (Signed)
Inpatient RehabilitationTeam Conference and Plan of Care Update Date: 10/05/2017   Time: 2:43 PM    Patient Name: Jaime Pierce      Medical Record Number: [id number]  Date of Birth: 1946/02/07 Sex: Male         Room/Bed: 4W08C/4W08C-01 Payor Info: Payor: [employer] MEDICARE / Plan: [employer] MEDICARE HMO/PPO / Product Type: *No Product type* /    Admitting Diagnosis: Stroke  Admit Date/Time:  09/09/2017  6:52 PM Admission Comments: No comment available   Primary Diagnosis:  Intraparenchymal hemorrhage of brain (HCC) Principal Problem: Intraparenchymal hemorrhage of brain [facility](HCC)  Patient Active Problem List   Diagnosis Date Noted  . Cough   . 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 Pain   . Hyponatremia   . Benign essential HTN   . Intraparenchymal hemorrhage of brain (HCC) 09/09/2017  . 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 Gait disturbance, post-stroke   . Aphasia as late effect of stroke   . SAH (subarachnoid hemorrhage) (HCC) 09/02/2017  . B12 deficiency 09/02/2017  . Well adult exam 06/16/2016  . Cigarette smoker 02/23/2016  . COPD GOLD II  12/24/2014  . CAD (coronary artery disease) 07/22/2011  . HTN (hypertension) 07/22/2011  . Dyslipidemia 07/22/2011  . Smoker 07/22/2011    Expected Discharge Date: Expected Discharge Date: 10/16/17  Team Members Present: Physician leading conference: Dr. Maryla MorrowAnkit Patel Social Worker Present: Amada JupiterLucy Rukaya Kleinschmidt, LCSW Nurse Present: Chrissie NoaMelanie Barnes, RN PT Present: Other (comment)(Cindy Sharee PimpleWynn, PT) OT Present: Roney MansJennifer Smith, OT SLP Present: Feliberto Gottronourtney Payne, SLP PPS Coordinator present : Tora DuckMarie Noel, RN, CRRN     Current Status/Progress Goal Weekly Team Focus  Medical   Right hemiplegia secondary to left parietal intraparenchymal hemorrhage likely due to hypertension and small vessel disease  Improve mobility, transfers, safety  See above   Bowel/Bladder    Continent of bowel and bladder. Last BM 11/12.  Remain continent of bowel and bladder.  Monitor for bowel and bladder function.   Swallow/Nutrition/ Hydration             ADL's   improved pushing syndrome, sit to stands still max A to max A+2, begining to perform stand step tranfsers with RW with mod to max A, mod A - max A for bathing and dressing   S/set up for sitting balance,UB self care, and grooming, mod A toileting, min A all other goals   right NMR functional transfers, self care retraining transfer tranining, cognitive remedication, pt/fam education   Mobility   mod A slide board and squat pivot transfers, max A sit to stand; mod A +2 gait in controlled enviornment,   min assist w/c level transfers; supervision w/c propulsion, mod car transfer, mod assist gait x 30'  transfers, midline orientation, postural alignment, gait   Communication   Supervision for complex comprehension and complex verbal expression   Supervision   Complex comprehension/verbal expression    Safety/Cognition/ Behavioral Observations  Min A for problem solving, Min-Mod A for recall   Min A  problem solving and recall    Pain   Denies pain.  Pain less than or equal to 2.  Assess for pain q shift and prn. Pre-medicate for therapy prn.   Skin   Rash to groin and buttock area. On nystatin powder.  No skin breakdown while in rehab.  Assess skin q shift and prn.      *See Care Plan and progress notes  for long and short-term goals.     Barriers to Discharge  Current Status/Progress Possible Resolutions Date Resolved   Physician    Medical stability     See above  Therapies, follow labs, CXR ordered for productive cough      Nursing                  PT  Home environment access/layout;Other (comments)  needs ramp, hypertonicity, apraxia              OT                  SLP                SW                Discharge Planning/Teaching Needs:  Plan to DC home with wife who can provide 24 hr care.    Teaching with wife ongoing - she is here daily   Team Discussion:  Checking CXR due to cough; recall still a little impaired as well as mental flexibility.  Good cognitive recovery overall.  Sit-stand @ mod- max but decrease in tendency to "push".  LB dressing at mod assist.  Began some ambualtion this morning with a rolling walker!  LTGs overall min assist but team feels very strongly that he will benefit with lengthening LOS another week in order to reach a more functional ambulation level.    Revisions to Treatment Plan:  Change in d/c date to 11/24    Continued Need for Acute Rehabilitation Level of Care: The patient requires daily medical management by a physician with specialized training in physical medicine and rehabilitation for the following conditions: Daily direction of a multidisciplinary physical rehabilitation program to ensure safe treatment while eliciting the highest outcome that is of practical value to the patient.: Yes Daily medical management of patient stability for increased activity during participation in an intensive rehabilitation regime.: Yes Daily analysis of laboratory values and/or radiology reports with any subsequent need for medication adjustment of medical intervention for : Neurological problems;Pulmonary problems  Lus Kriegel 10/05/2017, 2:43 PM

## 2017-10-05 NOTE — Progress Notes (Signed)
Occupational Therapy Session Note  Patient Details  Name: Jaime Pierce MRN: [id number] Date of Birth: Jul 05, 1946  Today's Date: 10/05/2017 OT Individual Time: 1045-1130 OT Individual Time Calculation (min): 45 min    Short Term Goals: Week 3:  OT Short Term Goal 1 (Week 3): Pt will complete toilet transfers with consistent Mod A OT Short Term Goal 2 (Week 3): Pt will don footwear with Mod A OT Short Term Goal 3 (Week 3): Pt will don overhead shirt with supervision   Skilled Therapeutic Interventions/Progress Updates:    Treatment session with focus on RUE NMR.  Pt received upright in w/c reporting progress with mobility, but frustrated with "lack of progress" with RUE.  Engaged in RUE NMR with towel glides on table, initially with tactile cues at elbow from therapist to reduce effects of gravity and friction.  Hand over hand assist to facilitate further ROM with shoulder flexion/extension and horizontal abduction/adduction.  Increased challenge to adding visual target to promote increased ROM.  Pt benefiting from analogy of learning how to play baseball with increasing challenge.  Educated pt and pt's wife on exercises to complete in room or at table in Dayroom for increased support and ROM.  Pt reporting pleased with progress experienced this session.  Therapy Documentation Precautions:  Precautions Precautions: Fall Precaution Comments: right sided weakness, right inattention Restrictions Weight Bearing Restrictions: No General:   Vital Signs: Oxygen Therapy SpO2: 96 % O2 Device: Not Delivered Pain:   Pt with no c/o pain  See Function Navigator for Current Functional Status.   Therapy/Group: Individual Therapy  Rosalio LoudHOXIE, Shandee Jergens 10/05/2017, 12:20 PM

## 2017-10-05 NOTE — Progress Notes (Signed)
Physical Therapy Session Note  Patient Details  Name: Jaime Pierce MRN: [id number] Date of Birth: 03-18-1946  Today's Date: 10/05/2017 PT Individual Time: 0900-1000 PT Individual Time Calculation (min): 60 min   Short Term Goals: Week 4:  PT Short Term Goal 1 (Week 4): Pt will demonstrate transfers w/c <>bed with minA PT Short Term Goal 2 (Week 4): Pt will ambulate x20' with consistent minA PT Short Term Goal 3 (Week 4): Pt will perform bed mobility with S on flat bed in ADL apartment PT Short Term Goal 4 (Week 4): Pt will demonstrate sit <>stand with consistent modA  Skilled Therapeutic Interventions/Progress Updates: Pt received seated in w/c, denies pain and agreeable to treatment. Pt excited by improving hand function. Transported to gym totalA. Sit <>stand modA with cue for LUE reaching forward towards L side for horseshoe. Staggered stance LLE lead with LUE reaching to L side with elevated mat table as end point. Standing alternating LE taps to 3" step; cues for L weight shift and improving righting reactions with min verbal cues required from therapist to recover LOB to R side. Assist initially required for stepping RLE backwards off of step, however improved to performing independently with repetition. Gait with standard walker x40' with minA +2 for walker management and w/c follow when ready to sit. Mod cues for weight shifting, midline orientation, and sequencing with alternating LE stepping and walker. Second trial minA +2 x30', upon attempting to turn pt with major LOB requiring maxA to recover, max cues to correct to midline and orient to task d/t apraxia and impulsivity. Once pt stopped in midline for standing rest break, able to perform turning/backwards steps to mat table minA. Sit <>stand from mat table with walker and min A/min guard. Stand pivot transfer to w/c towards L side with minA. Returned to room in w/c totalA by wife.      Therapy Documentation Precautions:   Precautions Precautions: Fall Precaution Comments: right sided weakness, right inattention Restrictions Weight Bearing Restrictions: No  See Function Navigator for Current Functional Status.   Therapy/Group: Individual Therapy  [name] 10/05/2017, 10:21 AM

## 2017-10-05 NOTE — Progress Notes (Addendum)
[facility]     PROGRESS NOTE   Subjective/Complaints: Pt seen sitting up, working with SLP this AM.  He slept well overnight. Cough reported, discussed with SLP.      ROS: +Cough. Denies fevers, chills, nausea, vomiting, diarrhea, shortness of breath or chest pain   Objective: Vital Signs: Blood pressure 129/69, pulse 72, temperature 97.9 F (36.6 C), temperature source Oral, resp. rate 17, height 6\' 1"  (1.854 m), weight 84.6 kg (186 lb 9.6 oz), SpO2 96 %. No results found. No results for input(s): WBC, HGB, HCT, PLT in the last 72 hours. No results for input(s): NA, K, CL, GLUCOSE, BUN, CREATININE, CALCIUM in the last 72 hours.  Invalid input(s): CO CBG (last 3)  No results for input(s): GLUCAP in the last 72 hours.  Wt Readings from Last 3 Encounters:  09/29/17 84.6 kg (186 lb 9.6 oz)  09/01/17 85.1 kg (187 lb 9.8 oz)  06/30/17 83 kg (183 lb)    Physical Exam:  Constitutional: He appears well-developed. NAD. HENT: Normocephalic. Atraumatic. Eyes: EOMare normal. No discharge. Cardiovascular: RRR. No JVD    Respiratory: CTA Bilaterally. Normal effort          GI: BS +, non-distended.  MSK: Mild edema right knee. No tenderness Neurological: He is alertand oriented.  Motor: LUE/LLE: 5/5 proximal to distal  RUE: 2-/5 right biceps and right shoulder adductor, 4-/5 wrist extension (stable) mAS 2/4 elbow flexors RLE: 3/5 HF, 3/5 KE and ankle PF/DF.    Skin.warm and dry.  Psych: Normal mood and behavior.   Assessment/Plan: 1. 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 Physiatrist is providing close team supervision and 24 hour management of active medical problems listed below. Physiatrist and rehab team continue to assess barriers to discharge/monitor patient progress toward functional and medical  goals.  Function:  Bathing Bathing position   Position: Shower  Bathing parts Body parts bathed by patient: Right arm, Chest, Abdomen, Front perineal area, Right upper leg, Left upper leg Body parts bathed by helper: Right lower leg, Left lower leg, Back, Buttocks, Left arm  Bathing assist Assist Level: Touching or steadying assistance(Pt > 75%)      Upper Body Dressing/Undressing Upper body dressing   What is the patient wearing?: Pull over shirt/dress     Pull over shirt/dress - Perfomed by patient: Thread/unthread left sleeve, Put head through opening Pull over shirt/dress - Perfomed by helper: Thread/unthread right sleeve, Pull shirt over trunk        Upper body assist Assist Level: Touching or steadying assistance(Pt > 75%)      Lower Body Dressing/Undressing Lower body dressing   What is the patient wearing?: Pants, Socks, Shoes   Underwear - Performed by helper: Thread/unthread right underwear leg, Thread/unthread left underwear leg, Pull underwear up/down Pants- Performed by patient: Thread/unthread left pants leg Pants- Performed by helper: Thread/unthread right pants leg, Pull pants up/down   Non-skid slipper socks- Performed by helper: Don/doff right sock, Don/doff left sock   Socks - Performed by helper: Don/doff right sock, Don/doff left sock Shoes - Performed by patient: Don/doff left shoe Shoes - Performed by helper: Don/doff right shoe          Lower body assist Assist for lower body dressing: Touching or steadying assistance (Pt > 75%)      Toileting Toileting Toileting activity did not occur: Safety/medical concerns  Toileting steps completed by helper: Performs perineal hygiene Toileting Assistive Devices: Grab bar or rail  Toileting assist Assist level: Two helpers   Transfers Chair/bed transfer   Chair/bed transfer method: Other(stedy) Chair/bed transfer assist level: Maximal assist (Pt 25 - 49%/lift and lower) Chair/bed transfer assistive  device: Armrests Mechanical lift: Stedy   Locomotion Ambulation Ambulation activity did not occur: Safety/medical concerns   Max distance: 15 Assist level: 2 helpers   Wheelchair   Type: Manual Max wheelchair distance: 25 Assist Level: Moderate assistance (Pt 50 - 74%)  Cognition Comprehension Comprehension assist level: Follows basic conversation/direction with extra time/assistive device  Expression Expression assist level: Expresses basic 90% of the time/requires cueing < 10% of the time.  Social Interaction Social Interaction assist level: Interacts appropriately 90% of the time - Needs monitoring or encouragement for participation or interaction.  Problem Solving Problem solving assist level: Solves basic 90% of the time/requires cueing < 10% of the time  Memory Memory assist level: Recognizes or recalls 50 - 74% of the time/requires cueing 25 - 49% of the time   Medical Problem List and Plan: 1. 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 DVT Prophylaxis/Anticoagulation: SCDs. Monitor for any signs of DVT  -Dopplers negative for DVT 3. Pain Management/spasticity:   - Continue baclofen 20 mg 3 times daily-has responded well to this so far  -PRAFO RLE  -ROM with therapy, family  -may be a botox candidate for RUE (pecs)  4. Mood: Provide emotional support 5. Neuropsych: This patient iscapable of making decisions on hisown behalf. 6. Skin/Wound Care: rash resolved   -keep skin dry 7. Fluids/Electrolytes/Nutrition:     -recheck bmet pending 8.CAD with CABG.No chest pain or shortness of breath 9. Hypertension. Lopressor 25 mg daily   Vitals:   10/05/17 0500 10/05/17 0853  BP: 129/69   Pulse: 72   Resp: 17   Temp: 97.9 F (36.6 C)   SpO2: 93% 96%    Controlled on 11/13 10.COPD/tobacco abuse. Counseling.Continue inhalers  -off oxygen 11. Hyponatremia: Resolved  Sodium  136 on 11/1  12. Mild leukocytosis:     -resolved  -follow up pending 13. Right knee pain with edema  Xray reviewed, showing mild degenerative changes in the patellofemoral joint  Voltaren gel PRN  Improving 14. Cough   Productive, likely secondary to COPD   Will order CXR to rule out infectious etiology  LOS (Days) 26 A FACE TO FACE EVALUATION WAS PERFORMED  Deangleo Passage Karis JubaAnil Damean Poffenberger, MD 10/05/2017 9:44 AM

## 2017-10-06 ENCOUNTER — Inpatient Hospital Stay (HOSPITAL_COMMUNITY): Payer: Medicare HMO | Admitting: Physical Therapy

## 2017-10-06 ENCOUNTER — Inpatient Hospital Stay (HOSPITAL_COMMUNITY): Payer: Medicare HMO | Admitting: Occupational Therapy

## 2017-10-06 ENCOUNTER — Inpatient Hospital Stay (HOSPITAL_COMMUNITY): Payer: Medicare HMO | Admitting: Speech Pathology

## 2017-10-06 ENCOUNTER — Encounter (HOSPITAL_COMMUNITY): Payer: Medicare HMO | Admitting: Psychology

## 2017-10-06 LAB — BASIC METABOLIC PANEL
ANION GAP: 8 (ref 5–15)
BUN: 19 mg/dL (ref 6–20)
CALCIUM: 9 mg/dL (ref 8.9–10.3)
CHLORIDE: 104 mmol/L (ref 101–111)
CO2: 27 mmol/L (ref 22–32)
CREATININE: 0.87 mg/dL (ref 0.61–1.24)
GFR calc Af Amer: >60 mL/min (ref >60)
GLUCOSE: 112 mg/dL — AB (ref 65–99)
POTASSIUM: 4.4 mmol/L (ref 3.5–5.1)
Sodium: 139 mmol/L (ref 135–145)

## 2017-10-06 LAB — CBC WITH DIFFERENTIAL/PLATELET
BASOS ABS: 0.1 10*3/uL (ref 0.0–0.1)
BASOS PCT: 2 %
EOS ABS: 0.3 10*3/uL (ref 0.0–0.7)
EOS PCT: 4 %
HCT: 39.4 % (ref 39.0–52.0)
Hemoglobin: 13.1 g/dL (ref 13.0–17.0)
Lymphocytes Relative: 27 %
Lymphs Abs: 2.1 10*3/uL (ref 0.7–4.0)
MCH: 30.8 pg (ref 26.0–34.0)
MCHC: 33.2 g/dL (ref 30.0–36.0)
MCV: 92.7 fL (ref 78.0–100.0)
MONO ABS: 0.8 10*3/uL (ref 0.1–1.0)
Monocytes Relative: 10 %
Neutro Abs: 4.4 10*3/uL (ref 1.7–7.7)
Neutrophils Relative %: 57 %
PLATELETS: 291 10*3/uL (ref 150–400)
RBC: 4.25 MIL/uL (ref 4.22–5.81)
RDW: 13.7 % (ref 11.5–15.5)
WBC: 7.6 10*3/uL (ref 4.0–10.5)

## 2017-10-06 NOTE — Progress Notes (Signed)
Jaime Pierce PHYSICAL MEDICINE & REHABILITATION     PROGRESS NOTE   Subjective/Complaints: Up at sink cleaning up. No new issues. Happy that he wsa up on walker yesterday.   ROS: pt denies nausea, vomiting, diarrhea, cough, shortness of breath or chest pain   Objective: Vital Signs: Blood pressure 137/70, pulse 69, temperature 97.9 F (36.6 C), temperature source Oral, resp. rate 16, height 6\' 1"  (1.854 m), weight 85 kg (187 lb 6.3 oz), SpO2 94 %. Dg Chest 2 View  Result Date: 10/05/2017 CLINICAL DATA:  Cough EXAM: CHEST  2 VIEW COMPARISON:  Chest radiograph 11/27/2014 FINDINGS: Lungs are hyperinflated. Sequelae of coronary artery bypass graft. No focal airspace consolidation or pulmonary edema. No pneumothorax or sizable pleural effusion. IMPRESSION: Hyperinflated lungs suggesting COPD.  No focal airspace disease. Electronically Signed   By: Deatra RobinsonKevin  Herman M.D.   On: 10/05/2017 19:51   Recent Labs    10/06/17 0626  WBC 7.6  HGB 13.1  HCT 39.4  PLT 291   Recent Labs    10/06/17 0626  NA 139  K 4.4  CL 104  GLUCOSE 112*  BUN 19  CREATININE 0.87  CALCIUM 9.0   CBG (last 3)  No results for input(s): GLUCAP in the last 72 hours.  Wt Readings from Last 3 Encounters:  10/06/17 85 kg (187 lb 6.3 oz)  09/01/17 85.1 kg (187 lb 9.8 oz)  06/30/17 83 kg (183 lb)    Physical Exam:  Constitutional: He appears well-developed. NAD. HENT: Normocephalic. Atraumatic. Eyes: EOMare normal. No discharge. Cardiovascular: RRR without murmur. No JVD   Respiratory: CTA Bilaterally without wheezes or rales. Normal effort           GI: BS +, non-distended.  MSK: Mild edema right knee. No tenderness Neurological: He is alertand oriented.  Motor: LUE/LLE: 5/5 proximal to distal  RUE: 2-/5 right biceps and right shoulder adductor, 4-/5 wrist extension (stable) Tone 0 to tr/4 elbow flexors RLE: 3/5 HF, 3/5 KE and ankle PF/DF.    Skin.warm and dry.  Psych: Normal mood and behavior.  outgoing  Assessment/Plan: 1. 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 Physiatrist is providing close team supervision and 24 hour management of active medical problems listed below. Physiatrist and rehab team continue to assess barriers to discharge/monitor patient progress toward functional and medical goals.  Function:  Bathing Bathing position   Position: Shower  Bathing parts Body parts bathed by patient: Right arm, Chest, Abdomen, Front perineal area, Right upper leg, Left upper leg Body parts bathed by helper: Right lower leg, Left lower leg, Back, Buttocks, Left arm  Bathing assist Assist Level: Touching or steadying assistance(Pt > 75%)      Upper Body Dressing/Undressing Upper body dressing   What is the patient wearing?: Pull over shirt/dress     Pull over shirt/dress - Perfomed by patient: Thread/unthread left sleeve, Put head through opening Pull over shirt/dress - Perfomed by helper: Thread/unthread right sleeve, Pull shirt over trunk        Upper body assist Assist Level: Touching or steadying assistance(Pt > 75%)      Lower Body Dressing/Undressing Lower body dressing   What is the patient wearing?: Pants, Socks, Shoes   Underwear - Performed by helper: Thread/unthread right underwear leg, Thread/unthread left underwear leg, Pull underwear up/down Pants- Performed by patient: Thread/unthread left pants leg Pants- Performed by helper: Thread/unthread  right pants leg, Pull pants up/down   Non-skid slipper socks- Performed by helper: Don/doff right sock, Don/doff left sock   Socks - Performed by helper: Don/doff right sock, Don/doff left sock Shoes - Performed by patient: Don/doff left shoe Shoes - Performed by helper: Don/doff right shoe          Lower body assist Assist for lower body dressing: Touching or steadying  assistance (Pt > 75%)      Toileting Toileting Toileting activity did not occur: Safety/medical concerns   Toileting steps completed by helper: Performs perineal hygiene Toileting Assistive Devices: Grab bar or rail  Toileting assist Assist level: Two helpers   Transfers Chair/bed transfer   Chair/bed transfer method: Stand pivot Chair/bed transfer assist level: Touching or steadying assistance (Pt > 75%) Chair/bed transfer assistive device: Biochemist, clinicalWalker Mechanical lift: Landscape architecttedy   Locomotion Ambulation Ambulation activity did not occur: Safety/medical concerns   Max distance: 30 Assist level: Moderate assist (Pt 50 - 74%)   Wheelchair   Type: Manual Max wheelchair distance: 25 Assist Level: Moderate assistance (Pt 50 - 74%)  Cognition Comprehension Comprehension assist level: Follows basic conversation/direction with extra time/assistive device  Expression Expression assist level: Expresses basic 90% of the time/requires cueing < 10% of the time.  Social Interaction Social Interaction assist level: Interacts appropriately 90% of the time - Needs monitoring or encouragement for participation or interaction.  Problem Solving Problem solving assist level: Solves basic 90% of the time/requires cueing < 10% of the time  Memory Memory assist level: Recognizes or recalls 50 - 74% of the time/requires cueing 25 - 49% of the time   Medical Problem List and Plan: 1. 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 DVT Prophylaxis/Anticoagulation: SCDs. Monitor for any signs of DVT  -Dopplers negative for DVT 3. Pain Management/spasticity:   - Continue baclofen 20 mg 3 times daily-has responded very well  -PRAFO RLE  -ROM with therapy, family  -less likely a botox candidate for RUE (pecs)  4. Mood: Provide emotional support 5. Neuropsych: This patient iscapable of making  decisions on hisown behalf. 6. Skin/Wound Care: rash resolved   -keep skin dry 7. Fluids/Electrolytes/Nutrition:     -recheck bmet today reviewed 8.CAD with CABG.No chest pain or shortness of breath 9. Hypertension. Lopressor 25 mg daily   Vitals:   10/05/17 2101 10/06/17 0547  BP:  137/70  Pulse:  69  Resp:  16  Temp:  97.9 F (36.6 C)  SpO2: 92% 94%    Controlled on 11/14 10.COPD/tobacco abuse. Counseling.Continue inhalers  -off oxygen 11. Hyponatremia: Resolved  Sodium 139 today  12. Mild leukocytosis:     -resolved 7.6 today    13. Right knee pain with edema  Xray reviewed, showing mild degenerative changes in the patellofemoral joint  Voltaren gel PRN  Improving 14. Cough   Productive, likely secondary to COPD   CXR reviewed personally and negative  LOS (Days) 27 A FACE TO FACE EVALUATION WAS PERFORMED  Faith RogueSWARTZ,ZACHARY T, MD 10/06/2017 9:00 AM

## 2017-10-06 NOTE — Progress Notes (Signed)
Physical Therapy Session Note  Patient Details  Name: Jaime FuelRichard A Mandrell MRN: [id number] Date of Birth: [birth year]  Today's Date: 10/06/2017 PT Individual Time: 1000-1100 and 1500-1555 PT Individual Time Calculation (min): 60 min and 55 min (total 115 min)   Short Term Goals: Week 4:  PT Short Term Goal 1 (Week 4): Pt will demonstrate transfers w/c <>bed with minA PT Short Term Goal 2 (Week 4): Pt will ambulate x20' with consistent minA PT Short Term Goal 3 (Week 4): Pt will perform bed mobility with S on flat bed in ADL apartment PT Short Term Goal 4 (Week 4): Pt will demonstrate sit <>stand with consistent modA  Skilled Therapeutic Interventions/Progress Updates: Tx 1: Pt received seated in w/c, denies pain and agreeable to treatment. Gait trial with litegait x15'; modA with poor L weight shift and R foot clearance. Suspect pt distracted by increased stimuli with litegait and additional staff, other patients in gym. Standing balance with S x5 min with min guard and BUE on RW; cues to reduce reliance leaning RW on litegait. Gait trial without lite gait, using RW and min/modA x50' including one 90 degree turn into hallway, and one trial x40' with reduced assist by therapist to manage RW. Cues required for posterior weight shift for neutral standing posture when attempting to progress RLE with heavy reliance on RW. Tactile cues provided for posterior weight shift for neutral alignment. Cues throughout to reduce RLE scissoring, tactile cues at R glute med/max for stance control to reduce trendelenburg and LOB to R side. Returned to room totalA in w/c by wife at end of session.   Tx 2: Pt received seated in w/c, denies pain and agreeable to treatment. Gait with RW x30' including two turns with min/modA; increased assist needed d/t RLE scissoring and LOBs to R side, however improving management of RW with reduced weight bearing through UEs. Standing LE toe taps to 6" step (trashcan) for focus on RLE  stance control with upright posture, L weight shifting. Gait x20' with RW and minA; improved RLE placement with increased time. Stand pivot transfer w/c>bed with minA and RW, increased time. Sit >supine with minA, increased time, assist for RLE placement in hookyling to A with scooting. Remained supine in bed, all needs in reach. Discussed with wife need for ramp; even if pt progresses to using steps, they do not have a rail currently, and for energy conservation purposes a ramp would be most beneficial. Wife reports friend who is building ramp can build something temporary, and agreeable to initiating that process asap.      Therapy Documentation Precautions:  Precautions Precautions: Fall Precaution Comments: right sided weakness, right inattention Restrictions Weight Bearing Restrictions: No   See Function Navigator for Current Functional Status.   Therapy/Group: Individual Therapy  [name] 10/06/2017, 11:24 AM

## 2017-10-06 NOTE — Progress Notes (Signed)
Speech Language Pathology Daily Session Note  Patient Details  Name: Jaime Pierce MRN: [id number] Date of Birth: 1946/05/29  Today's Date: 10/06/2017 SLP Individual Time: 0800-0900 SLP Individual Time Calculation (min): 60 min  Short Term Goals: Week 4: SLP Short Term Goal 1 (Week 4): Patient will recall new, daily information with Min A multimodal cues.  SLP Short Term Goal 2 (Week 4): Patient will follow 2 step directions with 75% accuracy and supervision verbal cues.  SLP Short Term Goal 3 (Week 4): Patient will verbally express mildly abstract thoughts at the phrase and sentence level with Mod I.  SLP Short Term Goal 4 (Week 4): Patient will demonstrate functional problem solving for mildly complex tasks with supervision verbal cues.  SLP Short Term Goal 5 (Week 4): Patient will self-monitor and correct errors during functional tasks with Min A verbal cues.   Skilled Therapeutic Interventions: Skilled treatment session focused on cognitive goals. SLP facilitated session by providing Min A verbal cues for patient to self-monitor and correct errors during a mildly complex reading comprehension task in which patient had to carryout 2-3 steps with multiple components. Patient left upright in wheelchair with all needs within reach. Continue with current plan of care.      Function:   Cognition Comprehension Comprehension assist level: Follows basic conversation/direction with extra time/assistive device  Expression   Expression assist level: Expresses basic 90% of the time/requires cueing < 10% of the time.  Social Interaction Social Interaction assist level: Interacts appropriately 90% of the time - Needs monitoring or encouragement for participation or interaction.  Problem Solving Problem solving assist level: Solves basic 90% of the time/requires cueing < 10% of the time  Memory Memory assist level: Recognizes or recalls 50 - 74% of the time/requires cueing 25 - 49% of the time     Pain No/Denies Pain   Therapy/Group: Individual Therapy  Monserath Neff 10/06/2017, 12:46 PM

## 2017-10-06 NOTE — Progress Notes (Signed)
Occupational Therapy Session Note  Patient Details  Name: Jaime FuelRichard A Cincotta MRN: [id number] Date of Birth: 01/15/1946  Today's Date: 10/06/2017 OT Individual Time: 1303-1400 OT Individual Time Calculation (min): 57 min    Short Term Goals: Week 3:  OT Short Term Goal 1 (Week 3): Pt will complete toilet transfers with consistent Mod A OT Short Term Goal 2 (Week 3): Pt will don footwear with Mod A OT Short Term Goal 3 (Week 3): Pt will don overhead shirt with supervision   Skilled Therapeutic Interventions/Progress Updates:    Upon entering the room, pt seated in wheelchair awaiting therapist and assisted to gym via wheelchair. Pt standing from wheelchair with mod A and ambulating to mat 10' with RW and mod A. OT performing scapular mobilizations in all planes to R UE with pt in side laying. Pt supine with focus on R NMR for PNF shoulder DI and D2 movement patterns with pt able to perform movements by sliding UE over chest . Pt seated on EOB with focus on sequencing and R NMR to pass box of tissues from L hand to R hand. Pt able to perform task with AAROM. Pt however, required max cues for sequencing and increased time to initiate task. Pt standing from mat with mod A and ambulates back to wheelchair in same manner. Pt propelled wheelchair with supervision and increased time to day room and  Left with wife. All needed items within reach upon exiting the room.   Therapy Documentation Precautions:  Precautions Precautions: Fall Precaution Comments: right sided weakness, right inattention Restrictions Weight Bearing Restrictions: No General:   Vital Signs: Therapy Vitals Temp: 97.8 F (36.6 C) Temp Source: Oral Pulse Rate: 73 Resp: 17 BP: 116/68 Patient Position (if appropriate): Sitting Oxygen Therapy SpO2: 93 % O2 Device: Not Delivered Pain:   ADL:   Vision   Perception    Praxis   Exercises:   Other Treatments:    See Function Navigator for Current Functional  Status.   Therapy/Group: Individual Therapy  Alen BleacherBradsher, Adellyn Capek P 10/06/2017, 5:32 PM

## 2017-10-06 NOTE — Consult Note (Signed)
Neuropsychological Consultation   Patient:   Jaime Pierce   DOB:   01-Aug-1946  MR Number:  [id number]  [facility]  [facility] A [id number] [facility] [place] [id number] Dept: [phone] Loc: [phone]           Date of Service:   09/15/2017  Start Time:   11 AM End Time:   12 PM  Provider/Observer:  Arley PhenixJohn Rodenbough, Psy.D.       Clinical Neuropsychologist       Billing Code/Service: [phone] [id number] Units  Chief Complaint:    Jaime FuelRichard a Cromwell is a 71 year old right hand male with a history of CAD with CABG, hypertension and tobacco abuse. The patient presented on 09/01/2017 with acute onset of right-sided weakness and facial droop. CT/MRI showed a left parietal lobe intraparenchymal hematoma. There was small volume acute subarachnoid hemorrhage in the left parietal lobe as well is the right frontal lobe. The patient has had ongoing difficulties with cognitive difficulties and adjustment to ongoing difficulties.  He has been improving, but still working on adjustment issues.  Reason for Service:  Mack HookRichard Trela was referred for neuropsychological consultation due to ongoing adjustment and difficulties from left parietal lobe and an anterior right frontal lobe hemorrhage. Below is the history of present illness for the current admission.  HPI: Deborah ChalkRichard A Jeffersis a 71 y.o.right handed malewith history of CAD with CABG, hypertension, tobacco abuse. Per chart review patient lives with spouse. Independent and active. Wife is also retired. One level home 2 steps to entry. Presented 09/01/2017 with acute onset of right sided weakness and facial droop. Blood pressure 170/90. CT/MRI showed a 4.2 x 2.5 x 3.1 cm left parietal lobe intraparenchymal hematoma. Small-volume acute subarachnoid hemorrhage was in the adjacent left parietal lobe as well as at the anterior right frontal lobe. CT angiogram head and neck  with no evidence of aneurysm or vascular malformation. Follow-up neurology services with conservative care. Echocardiogram with ejection fraction of 55% no wall motion abnormalities. Follow-up cranial CT scan 09/02/2017 showed a mild interval increase in size of left parietal lobe intraparenchymal hematoma measuring 4.7 x 5.4 x 4.9 cm. No other acute intracranial abnormality. Tolerating a regular consistency diet. Physical and occupationaltherapy evaluationscompleted with recommendations of physical medicine rehabilitation consult.Patient was admitted for a comprehensive rehabilitation program  Behavioral Observation: Jaime FuelRichard A Runyan  presents as a 71 y.o.-year-old Right  Male who appeared his stated age. his dress was Appropriate and he was Well Groomed and his manners were Appropriate to the situation.  his participation was indicative of Appropriate behaviors.  There were physical disabilities noted.  he displayed an appropriate level of cooperation and motivation.     Interactions:    Active Appropriate  Attention:   abnormal and attention span appeared shorter than expected for age  Memory:   abnormal; remote memory intact, recent memory impaired  Visuo-spatial:  within normal limits  Speech (Volume):  normal  Speech:   slurred;   Thought Process:  Coherent and Relevant  Though Content:  WNL; not suicidal  Orientation:   person, place, time/date and situation  Judgment:   Fair  Planning:   Fair  Affect:    Anxious and Irritable  Mood:    Anxious  Insight:   Fair  Intelligence:   normal  Medical History:   Past Medical History:  Diagnosis Date  . Coronary artery disease   . Dyspnea on exertion   .  Hyperlipidemia   . Hypertension   . Tobacco abuse          Family Med/Psych History:  Family History  Problem Relation Age of Onset  . Heart disease Father   . CVA Mother   . Heart disease Maternal Grandfather     Impression/DX:  Gerlene BurdockRichard a Elza RafterJeffers is a  71 year old right hand male with a history of CAD with CABG, hypertension and tobacco abuse. The patient presented on 09/01/2017 with acute onset of right-sided weakness and facial droop. CT/MRI showed a left parietal lobe intraparenchymal hematoma. There was small volume acute subarachnoid hemorrhage in the left parietal lobe as well is the right frontal lobe. The patient has had ongoing difficulties with cognitive difficulties and adjustment to ongoing difficulties.  Today, worked on further coping issues around planned discharge in near future.  The patient has shown continued significant improvement recently and goals have been upgraded.           Electronically Signed   _______________________ Arley PhenixJohn Rodenbough, Psy.D.

## 2017-10-07 ENCOUNTER — Inpatient Hospital Stay (HOSPITAL_COMMUNITY): Payer: Medicare HMO | Admitting: Speech Pathology

## 2017-10-07 ENCOUNTER — Inpatient Hospital Stay (HOSPITAL_COMMUNITY): Payer: Medicare HMO | Admitting: Physical Therapy

## 2017-10-07 ENCOUNTER — Inpatient Hospital Stay (HOSPITAL_COMMUNITY): Payer: Medicare HMO | Admitting: Occupational Therapy

## 2017-10-07 NOTE — Progress Notes (Signed)
Physical Therapy Session Note  Patient Details  Name: Jaime Pierce MRN: [id number] Date of Birth: 08/04/1946  Today's Date: 10/07/2017 PT Individual Time: 0800-0900 PT Individual Time Calculation (min): 60 min   Short Term Goals: Week 4:  PT Short Term Goal 1 (Week 4): Pt will demonstrate transfers w/c <>bed with minA PT Short Term Goal 2 (Week 4): Pt will ambulate x20' with consistent minA PT Short Term Goal 3 (Week 4): Pt will perform bed mobility with S on flat bed in ADL apartment PT Short Term Goal 4 (Week 4): Pt will demonstrate sit <>stand with consistent modA  Skilled Therapeutic Interventions/Progress Updates: Pt received seated on EOB, denies pain and agreeable to treatment. Sit >stand from bed with bedrail LUE and min guard, increased time required d/t motor planning impairments. Stand pivot transfer minA to w/c towards R side, assist for placement of RLE d/t incoordination with LE abduction. Stand pivot transfer w/c >mat table minA no AD towards R side, assist as above. 1/2 inch platform added to R shoe for forced weight shift to midline/L side. Sit <>stand with min guard/close S throughout session; occasional LOBs on initial 1-2 attempts and seated back on mat table to attempt again; reduced assist provided to allow pt to feel midline/balance impairments, and pt able to self-correct with L weight shift. Standing toe taps to 3" step with L hemiwalker and minA; cues for full extension in RLE stance, assist for RLE progression up to step and cues for "knee to chest" d/t tendency to try extending LE into step. Side stepping attempted R/L with HW at mat table; on initial attempt pt unable to motor plan and follow commands for lateral stepping. Trialed in sitting with pt given verbal cues for R vs L and "in" vs "out"; pt then able to carry over into standing and does best with external cues, I.e. "step the right towards me" "step the left toward the walker". Pt able to demo side stepping  R/L x5' each direction with min guard/minA for RLE foot placement and increased time, cues for upright posture. Educated pt on importance of lateral stepping for carryover into transfers, turning, etc. Gait with HW x50' with min guard/minA, cues for [facility] placement and full LLE step to pass R. Returned to room totalA by wife at end of session.      Therapy Documentation Precautions:  Precautions Precautions: Fall Precaution Comments: right sided weakness, right inattention Restrictions Weight Bearing Restrictions: No Pain: Pain Assessment Pain Score: 0-No pain   See Function Navigator for Current Functional Status.   Therapy/Group: Individual Therapy  [name] 10/07/2017, 9:12 AM

## 2017-10-07 NOTE — Progress Notes (Signed)
Occupational Therapy Session Note  Patient Details  Name: Delora FuelRichard A Johnston MRN: [id number] Date of Birth: June 26, 1946  Today's Date: 10/07/2017 OT Individual Time: [id number] OT Individual Time Calculation (min): 73 min    Short Term Goals: Week 3:  OT Short Term Goal 1 (Week 3): Pt will complete toilet transfers with consistent Mod A OT Short Term Goal 2 (Week 3): Pt will don footwear with Mod A OT Short Term Goal 3 (Week 3): Pt will don overhead shirt with supervision   Skilled Therapeutic Interventions/Progress Updates:    Upon entering the room, pt seated in wheelchair with wife present in room. Pt standing from wheelchair with mod lifting assistance and ambulating into bathroom with RW and mod multimodal cues. Pt removing clothing items while seated on TTB for bathing tasks. Bath mitt donned to R UE for hand over hand washing tasks. Pt required min A for sequencing and increased time with task. Pt donning clothing items from seated position on bench with focus on hemiplegic dressing. Pt requiring max multimodal cues and increased time for task. Pt becoming very frustrated during task. Pt ambulating back to wheelchair with max multimodal cues and multiple LOB requiring mod - max A to correct. Pt having difficulty sequencing movements and requiring increased tactile cues and manual facilitation. Pt seated in wheelchair for rest break. Therapist discussed home set up and equipment recommendations with caregiver. Call bell and all needed items within reach upon exiting the room.   Therapy Documentation Precautions:  Precautions Precautions: Fall Precaution Comments: right sided weakness, right inattention Restrictions Weight Bearing Restrictions: No General:   Vital Signs: Therapy Vitals Temp: 97.6 F (36.4 C) Temp Source: Oral Pulse Rate: 76 Resp: 18 BP: 122/73 Patient Position (if appropriate): Sitting Oxygen Therapy SpO2: 91 % O2 Device: Not Delivered Pain:   ADL:    Vision   Perception    Praxis   Exercises:   Other Treatments:    See Function Navigator for Current Functional Status.   Therapy/Group: Individual Therapy    Alen BleacherBradsher, Donavin Audino P 10/07/2017, 4:06 PM

## 2017-10-07 NOTE — Progress Notes (Signed)
Social Work Patient ID: Jaime Pierce, male   DOB: 06-18-1946, 71 y.o.   MRN: [id number]   Have reviewed team conference with pt and wife.  Both very pleased with continued progress and agreeable with team recommendation to extend CIR stay with new targeted dc date of 10/16/17.  They are aware that I have submitted this information to insurance CM as well and await response from them about continued coverage.  Will keep team, pt and wife informed.  Continue to follow.  Orie Baxendale, LCSW

## 2017-10-07 NOTE — Progress Notes (Signed)
[facility]IL[facility]     PROGRESS NOTE   Subjective/Complaints: Sitting at eob. Feels well.  No new issues today.  ROS: pt denies nausea, vomiting, diarrhea, cough, shortness of breath or chest pain   Objective: Vital Signs: Blood pressure (!) 131/91, pulse 71, temperature 97.9 F (36.6 C), temperature source Oral, resp. rate 16, height 6\' 1"  (1.854 m), weight 85 kg (187 lb 6.3 oz), SpO2 96 %. Dg Chest 2 View  Result Date: 10/05/2017 CLINICAL DATA:  Cough EXAM: CHEST  2 VIEW COMPARISON:  Chest radiograph 11/27/2014 FINDINGS: Lungs are hyperinflated. Sequelae of coronary artery bypass graft. No focal airspace consolidation or pulmonary edema. No pneumothorax or sizable pleural effusion. IMPRESSION: Hyperinflated lungs suggesting COPD.  No focal airspace disease. Electronically Signed   By: Deatra RobinsonKevin  Herman M.D.   On: 10/05/2017 19:51   Recent Labs    10/06/17 0626  WBC 7.6  HGB 13.1  HCT 39.4  PLT 291   Recent Labs    10/06/17 0626  NA 139  K 4.4  CL 104  GLUCOSE 112*  BUN 19  CREATININE 0.87  CALCIUM 9.0   CBG (last 3)  No results for input(s): GLUCAP in the last 72 hours.  Wt Readings from Last 3 Encounters:  10/06/17 85 kg (187 lb 6.3 oz)  09/01/17 85.1 kg (187 lb 9.8 oz)  06/30/17 83 kg (183 lb)    Physical Exam:  Constitutional: He appears well-developed. NAD. HENT: Normocephalic. Atraumatic. Eyes: EOMare normal. No discharge. Cardiovascular: RRR without murmur. No JVD    Respiratory:CTA Bilaterally without wheezes or rales. Normal effort       GI: BS +, non-distended.  MSK: Mild edema right knee. No tenderness Neurological: He is alertand oriented.  Motor: LUE/LLE: 5/5 proximal to distal  RUE: 2-/5 right biceps and right shoulder adductor, 4-/5 wrist extension (stable). 1+5 HI Tone remains trace out of 4 in the right upper and right lower extremities RLE: 3 to 3+/5 HF, 3/5 KE and ankle PF/DF.    Skin.warm and dry.  Psych: Normal  mood and behavior.  g  Assessment/Plan: 1. 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 Physiatrist is providing close team supervision and 24 hour management of active medical problems listed below. Physiatrist and rehab team continue to assess barriers to discharge/monitor patient progress toward functional and medical goals.  Function:  Bathing Bathing position   Position: Shower  Bathing parts Body parts bathed by patient: Right arm, Chest, Abdomen, Front perineal area, Right upper leg, Left upper leg Body parts bathed by helper: Right lower leg, Left lower leg, Back, Buttocks, Left arm  Bathing assist Assist Level: Touching or steadying assistance(Pt > 75%)      Upper Body Dressing/Undressing Upper body dressing   What is the patient wearing?: Pull over shirt/dress     Pull over shirt/dress - Perfomed by patient: Thread/unthread left sleeve, Put head through opening Pull over shirt/dress - Perfomed by helper: Thread/unthread right sleeve, Pull shirt over trunk        Upper body assist Assist Level: Touching or steadying assistance(Pt > 75%)      Lower Body Dressing/Undressing Lower body dressing   What is the patient wearing?: Pants, Socks, Shoes   Underwear - Performed by helper: Thread/unthread right underwear leg, Thread/unthread left underwear leg, Pull underwear up/down Pants- Performed by patient: Thread/unthread left pants leg Pants- Performed  by helper: Thread/unthread right pants leg, Pull pants up/down   Non-skid slipper socks- Performed by helper: Don/doff right sock, Don/doff left sock   Socks - Performed by helper: Don/doff right sock, Don/doff left sock Shoes - Performed by patient: Don/doff left shoe Shoes - Performed by helper: Don/doff right shoe          Lower body assist Assist for lower body dressing: Touching  or steadying assistance (Pt > 75%)      Toileting Toileting Toileting activity did not occur: Safety/medical concerns   Toileting steps completed by helper: Performs perineal hygiene Toileting Assistive Devices: Grab bar or rail  Toileting assist Assist level: Two helpers   Transfers Chair/bed transfer   Chair/bed transfer method: Stand pivot Chair/bed transfer assist level: Touching or steadying assistance (Pt > 75%) Chair/bed transfer assistive device: Biochemist, clinicalWalker Mechanical lift: Landscape architecttedy   Locomotion Ambulation Ambulation activity did not occur: Safety/medical concerns   Max distance: 50 Assist level: Moderate assist (Pt 50 - 74%)   Wheelchair   Type: Manual Max wheelchair distance: 25 Assist Level: Moderate assistance (Pt 50 - 74%)  Cognition Comprehension Comprehension assist level: Follows basic conversation/direction with extra time/assistive device  Expression Expression assist level: Expresses basic 90% of the time/requires cueing < 10% of the time.  Social Interaction Social Interaction assist level: Interacts appropriately 90% of the time - Needs monitoring or encouragement for participation or interaction.  Problem Solving Problem solving assist level: Solves basic 90% of the time/requires cueing < 10% of the time  Memory Memory assist level: Recognizes or recalls 50 - 74% of the time/requires cueing 25 - 49% of the time   Medical Problem List and Plan: 1. 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 DVT Prophylaxis/Anticoagulation: SCDs. Monitor for any signs of DVT  -Dopplers negative for DVT 3. Pain Management/spasticity:   - Continue baclofen 20 mg 3 times daily-has responded very well  -PRAFO RLE  -ROM with therapy, family  -less likely a botox candidate for RUE (pecs)  4. Mood: Provide emotional support 5.  Neuropsych: This patient iscapable of making decisions on hisown behalf. 6. Skin/Wound Care: rash resolved   -keep skin dry 7. Fluids/Electrolytes/Nutrition:     -Recent labs are normal 8.CAD with CABG.No chest pain or shortness of breath 9. Hypertension. Lopressor 25 mg daily   Vitals:   10/06/17 2042 10/07/17 0608  BP:  (!) 131/91  Pulse:  71  Resp:  16  Temp:  97.9 F (36.6 C)  SpO2: 95% 96%    Controlled on 11/14 10.COPD/tobacco abuse. Counseling.Continue inhalers  -off oxygen 11. Hyponatremia: Resolved  Sodium 139   12. Mild leukocytosis:     -resolved 7.6      13. Right knee pain with edema  Xray reviewed, showing mild degenerative changes in the patellofemoral joint  Voltaren gel PRN  Improving 14. Cough   Productive, likely secondary to COPD   CXR negative for acute change  LOS (Days) 28 A FACE TO FACE EVALUATION WAS PERFORMED  Ranelle OysterSWARTZ,Donnelle Rubey T, MD 10/07/2017 8:54 AM

## 2017-10-07 NOTE — Progress Notes (Signed)
Speech Language Pathology Daily Session Note  Patient Details  Name: Jaime Pierce A [place]heis MRN: [id number] Date of Birth: 1946/09/08  Today's Date: 10/07/2017 SLP Individual Time: 1300-1355 SLP Individual Time Calculation (min): 55 min  Short Term Goals: Week 4: SLP Short Term Goal 1 (Week 4): Patient will recall new, daily information with Min A multimodal cues.  SLP Short Term Goal 2 (Week 4): Patient will follow 2 step directions with 75% accuracy and supervision verbal cues.  SLP Short Term Goal 3 (Week 4): Patient will verbally express mildly abstract thoughts at the phrase and sentence level with Mod I.  SLP Short Term Goal 4 (Week 4): Patient will demonstrate functional problem solving for mildly complex tasks with supervision verbal cues.  SLP Short Term Goal 5 (Week 4): Patient will self-monitor and correct errors during functional tasks with Min A verbal cues.   Skilled Therapeutic Interventions: Skilled treatment session focused on cognitive goals. Patient completed a mildly complex comprehension task in which the patient had to carryout 2-3 steps with multiple components with Mod I. Patient left upright in wheelchair with all needs within reach. Continue with current plan of care.        Function:  Cognition Comprehension Comprehension assist level: Follows basic conversation/direction with extra time/assistive device  Expression   Expression assist level: Expresses basic 90% of the time/requires cueing < 10% of the time.  Social Interaction Social Interaction assist level: Interacts appropriately 90% of the time - Needs monitoring or encouragement for participation or interaction.  Problem Solving Problem solving assist level: Solves basic 90% of the time/requires cueing < 10% of the time  Memory Memory assist level: Recognizes or recalls 75 - 89% of the time/requires cueing 10 - 24% of the time    Pain No/Denies Pain   Therapy/Group: Individual Therapy  Pax Reasoner,  Emerly Prak 10/07/2017, 2:19 PM

## 2017-10-08 ENCOUNTER — Inpatient Hospital Stay (HOSPITAL_COMMUNITY): Payer: Medicare HMO | Admitting: Speech Pathology

## 2017-10-08 ENCOUNTER — Inpatient Hospital Stay (HOSPITAL_COMMUNITY): Payer: Medicare HMO | Admitting: Physical Therapy

## 2017-10-08 ENCOUNTER — Inpatient Hospital Stay (HOSPITAL_COMMUNITY): Payer: Medicare HMO | Admitting: Occupational Therapy

## 2017-10-08 NOTE — Progress Notes (Signed)
Speech Language Pathology Weekly Progress and Session Note  Patient Details  Name: Jaime Pierce MRN: [id number] Date of Birth: 08/18/1946  Beginning of progress report period: October 01, 2017 End of progress report period: October 08, 2017  Today's Date: 10/08/2017 SLP Individual Time: 1415-1500 SLP Individual Time Calculation (min): 45 min  Short Term Goals: Week 4: SLP Short Term Goal 1 (Week 4): Patient will recall new, daily information with Min A multimodal cues.  SLP Short Term Goal 1 - Progress (Week 4): Not met SLP Short Term Goal 2 (Week 4): Patient will follow 2 step directions with 75% accuracy and supervision verbal cues.  SLP Short Term Goal 2 - Progress (Week 4): Met SLP Short Term Goal 3 (Week 4): Patient will verbally express mildly abstract thoughts at the phrase and sentence level with Mod I.  SLP Short Term Goal 3 - Progress (Week 4): Met SLP Short Term Goal 4 (Week 4): Patient will demonstrate functional problem solving for mildly complex tasks with supervision verbal cues.  SLP Short Term Goal 4 - Progress (Week 4): Met SLP Short Term Goal 5 (Week 4): Patient will self-monitor and correct errors during functional tasks with Min A verbal cues.  SLP Short Term Goal 5 - Progress (Week 4): Met    New Short Term Goals: Week 5: SLP Short Term Goal 1 (Week 5): Patient will recall new, daily information with Min A multimodal cues.  SLP Short Term Goal 2 (Week 5): Patient will self-monitor and correct errors during functional tasks with supervision A verbal cues.  SLP Short Term Goal 3 (Week 5): Patient will follow multi step directions with 75% accuracy and supervision verbal cues.   Weekly Progress Updates: Patient continued to make functional gains and has met 4 of 5 STG's this reporting period. Currently, patient requires extra time and supervision verbal cues for auditory comprehension in regards to 2 step directions and is overall Mod I for verbal expression.  Patient also requires Mod I-supervision verbal cues for complex problem solving but continues to require Min-Mod verbal cues for recall of new information with use of compensatory strategies. Patient would benefit from continued skilled SLP intervention to maximize her cognitive-linguistic function and overall functional independence prior to discharge.   Intensity: Minumum of 1-2 x/day, 30 to 90 minutes Frequency: 3 to 5 out of 7 days Duration/Length of Stay: 10/16/17 Treatment/Interventions: Cognitive remediation/compensation;Cueing hierarchy;Functional tasks;Patient/family education;Therapeutic Activities;Environmental controls;Internal/external aids;Speech/Language facilitation   Daily Session  Skilled Therapeutic Interventions: Skilled treatment session focused on cognitive goals. SLP facilitated session by providing Max A verbal cues for recall of 4 items after a 2 minute delay. Patient verbalized need for use of external aid to assist with recall of items to be located at a later time but then required Mod A verbal cues to utilize aid throughout task. Patient handed off to PT. Continue with current plan of care.      Function:    Cognition Comprehension Comprehension assist level: Follows basic conversation/direction with extra time/assistive device  Expression   Expression assist level: Expresses basic 90% of the time/requires cueing < 10% of the time.  Social Interaction Social Interaction assist level: Interacts appropriately 90% of the time - Needs monitoring or encouragement for participation or interaction.  Problem Solving Problem solving assist level: Solves basic 90% of the time/requires cueing < 10% of the time  Memory Memory assist level: Recognizes or recalls 50 - 74% of the time/requires cueing 25 - 49% of the time  Pain No/Denies Pain   Therapy/Group: Individual Therapy  ,  10/08/2017, 3:07 PM         

## 2017-10-08 NOTE — Progress Notes (Signed)
[facility] MEDICINE & REHABILITATION     PROGRESS NOTE   Subjective/Complaints:  Sitting at edge of bed eating breakfast.  No new issues.  Thinks he is moving his arm a bit more than he has been.  Walked with therapy yesterday  ROS: pt denies nausea, vomiting, diarrhea, cough, shortness of breath or chest pain  Objective: Vital Signs: Blood pressure 119/72, pulse 79, temperature 97.6 F (36.4 C), temperature source Oral, resp. rate 18, height 6\' 1"  (1.854 m), weight 85 kg (187 lb 6.3 oz), SpO2 92 %. No results found. Recent Labs    10/06/17 0626  WBC 7.6  HGB 13.1  HCT 39.4  PLT 291   Recent Labs    10/06/17 0626  NA 139  K 4.4  CL 104  GLUCOSE 112*  BUN 19  CREATININE 0.87  CALCIUM 9.0   CBG (last 3)  No results for input(s): GLUCAP in the last 72 hours.  Wt Readings from Last 3 Encounters:  10/06/17 85 kg (187 lb 6.3 oz)  09/01/17 85.1 kg (187 lb 9.8 oz)  06/30/17 83 kg (183 lb)    Physical Exam:  Constitutional: He appears well-developed. NAD. HENT: Normocephalic. Atraumatic. Eyes: EOMare normal. No discharge. Cardiovascular: RRR without murmur. No JVD     Respiratory: CTA Bilaterally without wheezes or rales. Normal effort        GI: BS +, non-distended.  MSK: Mild edema right knee. No tenderness Neurological: He is alertand oriented.  Motor: LUE/LLE: 5/5 proximal to distal  RUE: 2/5 right biceps and right shoulder adductor, 4-/5 wrist extension (stable). 1+5 HI Tone remains trace to 0 out of 4 in the right upper and right lower extremities.  Good sitting balance RLE: 3 to 3+/5 HF, 3/5 KE and ankle PF/DF.    Skin.warm and dry.  Psych: Normal mood and behavior.     Assessment/Plan: 1. 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 Physiatrist is providing close team supervision and 24 hour management of active  medical problems listed below. Physiatrist and rehab team continue to assess barriers to discharge/monitor patient progress toward functional and medical goals.  Function:  Bathing Bathing position   Position: Shower  Bathing parts Body parts bathed by patient: Right arm, Chest, Abdomen, Front perineal area, Right upper leg, Left upper leg Body parts bathed by helper: Right lower leg, Left lower leg, Back, Buttocks, Left arm  Bathing assist Assist Level: (mod a)      Upper Body Dressing/Undressing Upper body dressing   What is the patient wearing?: Pull over shirt/dress     Pull over shirt/dress - Perfomed by patient: Thread/unthread left sleeve, Put head through opening Pull over shirt/dress - Perfomed by helper: Thread/unthread right sleeve, Pull shirt over trunk        Upper body assist Assist Level: (mod a)      Lower Body Dressing/Undressing Lower body dressing   What is the patient wearing?: Pants, Socks, Shoes   Underwear - Performed by helper: Thread/unthread right underwear leg, Thread/unthread left underwear leg, Pull underwear up/down Pants- Performed by patient: Thread/unthread left pants leg, Pull pants up/down Pants- Performed by helper: Thread/unthread right pants leg   Non-skid slipper socks- Performed by helper: Don/doff right sock, Don/doff left sock   Socks - Performed by helper: Don/doff right sock, Don/doff left sock Shoes - Performed by patient: Don/doff left shoe Shoes - Performed  by helper: Don/doff right shoe          Lower body assist Assist for lower body dressing: (mod a)      Toileting Toileting Toileting activity did not occur: Safety/medical concerns   Toileting steps completed by helper: Performs perineal hygiene Toileting Assistive Devices: Grab bar or rail  Toileting assist Assist level: Two helpers   Transfers Chair/bed transfer   Chair/bed transfer method: Stand pivot Chair/bed transfer assist level: Touching or steadying  assistance (Pt > 75%) Chair/bed transfer assistive device: Bedrails Mechanical lift: Stedy   Locomotion Ambulation Ambulation activity did not occur: Safety/medical concerns   Max distance: 50 Assist level: Moderate assist (Pt 50 - 74%)   Wheelchair   Type: Manual Max wheelchair distance: 25 Assist Level: Moderate assistance (Pt 50 - 74%)  Cognition Comprehension Comprehension assist level: Follows basic conversation/direction with extra time/assistive device  Expression Expression assist level: Expresses basic 90% of the time/requires cueing < 10% of the time.  Social Interaction Social Interaction assist level: Interacts appropriately 90% of the time - Needs monitoring or encouragement for participation or interaction.  Problem Solving Problem solving assist level: Solves basic 90% of the time/requires cueing < 10% of the time  Memory Memory assist level: Recognizes or recalls 50 - 74% of the time/requires cueing 25 - 49% of the time   Medical Problem List and Plan: 1. 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  He still have a week until his scheduled discharge at this point, however 2. DVT Prophylaxis/Anticoagulation: SCDs. Monitor for any signs of DVT  -Dopplers negative for DVT 3. Pain Management/spasticity:   - Continue baclofen 20 mg 3 times daily-has responded very well  -PRAFO RLE  -ROM with therapy, family  -less likely a botox candidate for RUE (pecs)  4. Mood: Provide emotional support 5. Neuropsych: This patient iscapable of making decisions on hisown behalf. 6. Skin/Wound Care: rash resolved   -keep skin dry 7. Fluids/Electrolytes/Nutrition:     -Recent labs are normal 8.CAD with CABG.No chest pain or shortness of breath 9. Hypertension. Lopressor 25 mg daily   Vitals:    10/07/17 2040 10/07/17 2200  BP:  119/72  Pulse: 88 79  Resp: 18   Temp:    SpO2: 90% 92%    Controlled on 11/14 10.COPD/tobacco abuse. Counseling.Continue inhalers  -off oxygen 11. Hyponatremia: Resolved  Sodium 139   12. Mild leukocytosis:     -resolved 7.6      13. Right knee pain with edema  Xray reviewed, showing mild degenerative changes in the patellofemoral joint  Voltaren gel PRN  Improving 14. Cough   Productive, likely secondary to COPD   CXR negative for acute change  LOS (Days) 29 A FACE TO FACE EVALUATION WAS PERFORMED  Faith RogueSWARTZ,Addalynne Golding T, MD 10/08/2017 8:59 AM

## 2017-10-08 NOTE — Progress Notes (Signed)
Physical Therapy Weekly Progress Note  Patient Details  Name: Jaime Pierce MRN: [id number] Date of Birth: [birth year]  Beginning of progress report period: October 01, 2017 End of progress report period: October 08, 2017  Today's Date: 10/08/2017 PT Individual Time: 1100-1200 and 1515-1545 PT Individual Time Calculation (min): 60 min and 30 min (total 90 min)   Patient has met 3 of 4 short term goals.  Pt currently requires S/occasional minA for bed mobility, min guard/minA for sit <>stand with multiple attempts required d/t tendency to push with LUE. Stand pivot transfers consistent minA with appropriate cueing. Gait training has progressed to min/modA with RW and hemi walker interchangeably, with neuromuscular interventions working to address internal righting reactions to reduce reliance on assistive devices. Pt continues to require skilled intervention due to significant apraxia, ongoing (however, improving) pusher syndrome, and poor focused attention. Wife remains involved in therapy sessions to observe progress. Plan to begin hands-on training early next week to prepare for d/c home.   Patient continues to demonstrate the following deficits muscle weakness and muscle joint tightness, decreased cardiorespiratoy endurance, impaired timing and sequencing, unbalanced muscle activation, motor apraxia, decreased coordination and decreased motor planning, decreased midline orientation, decreased attention to right, decreased motor planning and ideational apraxia, decreased initiation, decreased attention, decreased awareness, decreased problem solving, decreased safety awareness, decreased memory and delayed processing and decreased sitting balance, decreased standing balance, decreased postural control, hemiplegia and decreased balance strategies and therefore will continue to benefit from skilled PT intervention to increase functional independence with mobility.  Patient progressing toward  long term goals..  Continue plan of care.  PT Short Term Goals Week 4:  PT Short Term Goal 1 (Week 4): Pt will demonstrate transfers w/c <>bed with minA PT Short Term Goal 1 - Progress (Week 4): Met PT Short Term Goal 2 (Week 4): Pt will ambulate x20' with consistent minA PT Short Term Goal 2 - Progress (Week 4): Partly met PT Short Term Goal 3 (Week 4): Pt will perform bed mobility with S on flat bed in ADL apartment PT Short Term Goal 3 - Progress (Week 4): Met PT Short Term Goal 4 (Week 4): Pt will demonstrate sit <>stand with consistent modA PT Short Term Goal 4 - Progress (Week 4): Met Week 5:  PT Short Term Goal 1 (Week 5): =LTG due to estimated LOS  Skilled Therapeutic Interventions/Progress Updates: Tx 1: pt received seated in w/c, denies pain and agreeable to treatment. W/c <>mat table stand pivot no AD min A, cues for L weight shift to improve R foot clearance. Standing side steps R/L no AD with min/modA; difficulty with weightshift L of center to improve R foot clearance, and incoordination with R foot placement. Sit >supine minA for RLE management. Supine>prone modA for managing RUE. Prone on elbows x3 min for RUE weight bearing, and therapist performing RLE quad/hip flexor stretch. Prone on elbows>quadruped maxA. Performed supported quadruped with forward/backward weight shifts with assist to maintain R hand placement. Quadruped>tall kneeling minA with BUE supported on stability ball. Ab roller ball pushes forward and lateral with cues for L trunk lengthening/R trunk shortening with roll to R. Side sitting propped on L hip/L hand; pt performed RUE on stability ball forward/backward push/pull with tactile cues for tricep activation. MaxA to return to sitting on edge of mat. Stand pivot back to w/c with minA. Returned to room totalA in w/c by daughter at completion of session.   Tx 2: Pt received seated in w/c in gym;  denies pain. Sit <>stand min guard to facilitate smooth coordinated  movement. Standing heel raises, toe raises, hip abduction for focus on lateral weight shifts, symmetrical BLE weight bearing, righting reactions and stance control RLE. Side stepping R/L and backwards walking in parallel bars with light UE support, cues for reducing visual input via mirror d/t increased incoordination when pt attempting to place feet using mirror. Gait attempted with grocery cart; significant pushing and unable to maintain neutral postural alignment. Switched to [facility] for remainder of gait trial and able to ambulate x30' with min/modA; occasional assist for RLE placement to reduce scissoring. Returned to room totalA by wife at end of session.      Therapy Documentation Precautions:  Precautions Precautions: Fall Precaution Comments: right sided weakness, right inattention Restrictions Weight Bearing Restrictions: No Pain: Pain Assessment Pain Assessment: No/denies pain Pain Score: 0-No pain   See Function Navigator for Current Functional Status.  Therapy/Group: Individual Therapy  Luberta Mutter 10/08/2017, 12:59 PM

## 2017-10-08 NOTE — Progress Notes (Signed)
Occupational Therapy Session Note  Patient Details  Name: Jaime Pierce A Lemke MRN: [id number] Date of Birth: 1945-12-28  Today's Date: 10/08/2017 OT Individual Time: 1300-1415 OT Individual Time Calculation (min): 75 min   Skilled Therapeutic Interventions/Progress Updates:    Tx focus on Rt NMR, standing balance, and motor planning.   Pt greeted in w/c, agreeable to tx. Pt doffing/donning shirt with supervision and mod questioning cues for correct orientation. Pt exhibiting carryover of hemi techniques with extra time. Also improved ability to correctly identify areas of shirt! He was escorted to dayroom. Worked on Rt grasp/release with cones in standing and sitting positions. Pt with decreased hand strength and required use of dycem. HOH for forming gross grasp around cone with all digits involved. Associated reactions R UE/LE while sitting. Pt with increased awareness of extending Rt knee while standing at high table, however still required max support to prevent buckling while UEs were engaged (Min A sit<stand!). Noted increased wrist flexion when he was stacking cones due to limited wrist extension. Educated pt/spouse on importance of tenodesis, recommended for them to complete wrist extension stretches outside of tx to improve his ROM. Worked on gentle AAROM of wrist, as well as proximal strengthening in gravity eliminated planes. Provided tactile cues and modeling to enhance understanding of exercise instructions as verbal directions (up/down, right/left) are still confusing to him. Educated spouse on simple exercises she could complete with him to further functional gains with Rt arm.  He was then returned to room and left via SLP handoff.   Therapy Documentation Precautions:  Precautions Precautions: Fall Precaution Comments: right sided weakness, right inattention Restrictions Weight Bearing Restrictions: No Pain: No c/o pain during session   ADL:      See Function Navigator for  Current Functional Status.   Therapy/Group: Individual Therapy  Kelin Nixon A Gurjit Loconte 10/08/2017, 3:58 PM

## 2017-10-09 ENCOUNTER — Inpatient Hospital Stay (HOSPITAL_COMMUNITY): Payer: Medicare HMO | Admitting: Occupational Therapy

## 2017-10-09 DIAGNOSIS — I69351 Hemiplegia and hemiparesis following cerebral infarction affecting right dominant side: Secondary | ICD-10-CM

## 2017-10-09 DIAGNOSIS — G8110 Spastic hemiplegia affecting unspecified side: Secondary | ICD-10-CM

## 2017-10-09 NOTE — Plan of Care (Signed)
  Progressing Consults RH STROKE PATIENT EDUCATION Description See Patient Education module for education specifics   10/09/2017 1657 - Progressing by Melina ModenaBurchett, Kimani Bedoya, RN Nutrition Consult-if indicated 10/09/2017 1657 - Progressing by Melina ModenaBurchett, Earlie Arciga, RN RH BOWEL ELIMINATION RH STG MANAGE BOWEL WITH ASSISTANCE Description STG Manage Bowel with min Assistance.   10/09/2017 1657 - Progressing by Melina ModenaBurchett, Madisan Bice, RN RH STG MANAGE BOWEL W/MEDICATION W/ASSISTANCE Description STG Manage Bowel with Medication with min Assistance.   10/09/2017 1657 - Progressing by Melina ModenaBurchett, Robb Sibal, RN RH BLADDER ELIMINATION RH STG MANAGE BLADDER WITH ASSISTANCE Description STG Manage Bladder With min Assistance   10/09/2017 1657 - Progressing by Melina ModenaBurchett, Tarynn Garling, RN RH SKIN INTEGRITY RH STG SKIN FREE OF INFECTION/BREAKDOWN Description Prevent skin breakdown or infection with mod assist  10/09/2017 1657 - Progressing by Melina ModenaBurchett, Sharbel Sahagun, RN RH SAFETY RH STG ADHERE TO SAFETY PRECAUTIONS W/ASSISTANCE/DEVICE Description STG Adhere to Safety Precautions With mod I Assistance/Device.   10/09/2017 1657 - Progressing by Melina ModenaBurchett, Yousuf Ager, RN RH PAIN MANAGEMENT RH STG PAIN MANAGED AT OR BELOW PT'S PAIN GOAL Description Less than 3 out of 10  10/09/2017 1657 - Progressing by Melina ModenaBurchett, Clevester Helzer, RN

## 2017-10-09 NOTE — Progress Notes (Signed)
Occupational Therapy Session Note  Patient Details  Name: Jaime Pierce MRN: [id number] Date of Birth: 06/07/1946  Today's Date: 10/09/2017 OT Individual Time: 1001-1101 OT Individual Time Calculation (min): 60 min   Short Term Goals: Week 3:  OT Short Term Goal 1 (Week 3): Pt will complete toilet transfers with consistent Mod A OT Short Term Goal 2 (Week 3): Pt will don footwear with Mod A OT Short Term Goal 3 (Week 3): Pt will don overhead shirt with supervision   Skilled Therapeutic Interventions/Progress Updates:    Tx focus on functional ambulation with device, standing balance, and Rt NMR during functional tasks.   Pt greeted in w/c. Bathed, dressed, and ready to go.  He ambulated down hallway in straight path 19 ft with Mod A, multimodal cues for widening base of support and maintaining upright posture. He was escorted remainder of way to therapy apartment. Pt engaged in Rt NMR grasp/release tasks in sitting and standing positions. While sitting, worked on [employer]hman BrothersAROM for [employer]t wrist in prep for activity. Pt forming various grasps on large kitchen utensils and placing them on countertop. He required manual guidance to extend elbow for success. Pt standing at countertop and grasping textured fruits (with assist to stabilize fruit only), bringing fruits to chin in prep for using R UE functionally during self feeding. Due to associated reactions with R LE while R UE was engaged, he required max manual cues to prevent R LE from buckling. Encouraged pt/spouse to have him try using Rt hand for consuming finger foods at meals. At end of tx pt was escorted back to room and left with spouse.    Therapy Documentation Precautions:  Precautions Precautions: Fall Precaution Comments: right sided weakness, right inattention Restrictions Weight Bearing Restrictions: No  Pain: Pain Assessment Pain Score: 0-No pain ADL:   See Function Navigator for Current Functional Status.   Therapy/Group:  Individual Therapy  Jaime Pierce 10/09/2017, 12:25 PM

## 2017-10-09 NOTE — Progress Notes (Signed)
[place] [facility]IC[facility]ILIT[facility]     PROGRESS NOTE   Subjective/Complaints: Patient seen lying in bed this morning.  He states he slept well overnight.  He denies complaints this morning.  ROS: Denies nausea, vomiting, diarrhea, shortness of breath or chest pain   Objective: Vital Signs: Blood pressure 117/66, pulse 75, temperature (!) 97.5 F (36.4 C), temperature source Oral, resp. rate 18, height 6\' 1"  (1.854 m), weight 85 kg (187 lb 6.3 oz), SpO2 94 %. No results found. No results for input(s): WBC, HGB, HCT, PLT in the last 72 hours. No results for input(s): NA, K, CL, GLUCOSE, BUN, CREATININE, CALCIUM in the last 72 hours.  Invalid input(s): CO CBG (last 3)  No results for input(s): GLUCAP in the last 72 hours.  Wt Readings from Last 3 Encounters:  10/06/17 85 kg (187 lb 6.3 oz)  09/01/17 85.1 kg (187 lb 9.8 oz)  06/30/17 83 kg (183 lb)    Physical Exam:  Constitutional: He appears well-developed. NAD. HENT: Normocephalic. Atraumatic. Eyes: EOMare normal. No discharge. Cardiovascular: RRR. No JVD     Respiratory: CTA Bilaterally.  Normal effort   GI: BS +, non-distended.  MSK: Mild edema right knee. No tenderness Neurological: He is alertand oriented.  Motor: LUE/LLE: 5/5 proximal to distal  RUE: 2/5 shoulder abduction, 3+/5 elbow flexion/extension, 2+/5 hand grip  mAS 2/4 right elbow flexor RLE: 3 to 3+/5 HF, 3/5 KE and ankle PF/DF.    Skin.warm and dry.  Psych: Normal mood and behavior.     Assessment/Plan: 1. 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 Physiatrist is providing close team supervision and 24 hour management of active medical problems listed below. Physiatrist and rehab team continue to assess barriers to discharge/monitor patient progress toward functional and medical  goals.  Function:  Bathing Bathing position   Position: Shower  Bathing parts Body parts bathed by patient: Right arm, Chest, Abdomen, Front perineal area, Right upper leg, Left upper leg Body parts bathed by helper: Right lower leg, Left lower leg, Back, Buttocks, Left arm  Bathing assist Assist Level: (mod a)      Upper Body Dressing/Undressing Upper body dressing   What is the patient wearing?: Pull over shirt/dress     Pull over shirt/dress - Perfomed by patient: Thread/unthread left sleeve, Put head through opening Pull over shirt/dress - Perfomed by helper: Thread/unthread right sleeve, Pull shirt over trunk        Upper body assist Assist Level: (mod a)      Lower Body Dressing/Undressing Lower body dressing   What is the patient wearing?: Pants, Socks, Shoes   Underwear - Performed by helper: Thread/unthread right underwear leg, Thread/unthread left underwear leg, Pull underwear up/down Pants- Performed by patient: Thread/unthread left pants leg, Pull pants up/down Pants- Performed by helper: Thread/unthread right pants leg   Non-skid slipper socks- Performed by helper: Don/doff right sock, Don/doff left sock   Socks - Performed by helper: Don/doff right sock, Don/doff left sock Shoes - Performed by patient: Don/doff left shoe Shoes - Performed by helper: Don/doff right shoe          Lower body assist Assist for lower body dressing: (mod a)      Financial traderToileting Toileting Toileting activity did not occur: Safety/medical concerns   Toileting steps completed by helper: Performs perineal hygiene Toileting Assistive Devices: Grab bar or Systems analystrail  Toileting  assist Assist level: Two helpers   Transfers Chair/bed transfer   Chair/bed transfer method: Stand pivot Chair/bed transfer assist level: Touching or steadying assistance (Pt > 75%) Chair/bed transfer assistive device: Armrests Mechanical lift: Stedy   Locomotion Ambulation Ambulation activity did not occur:  Safety/medical concerns   Max distance: 30 Assist level: Moderate assist (Pt 50 - 74%)   Wheelchair   Type: Manual Max wheelchair distance: 25 Assist Level: Moderate assistance (Pt 50 - 74%)  Cognition Comprehension Comprehension assist level: Follows basic conversation/direction with extra time/assistive device  Expression Expression assist level: Expresses basic 90% of the time/requires cueing < 10% of the time.  Social Interaction Social Interaction assist level: Interacts appropriately 90% of the time - Needs monitoring or encouragement for participation or interaction.  Problem Solving Problem solving assist level: Solves basic 90% of the time/requires cueing < 10% of the time  Memory Memory assist level: Recognizes or recalls 50 - 74% of the time/requires cueing 25 - 49% of the time   Medical Problem List and Plan: 1. 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  2. DVT Prophylaxis/Anticoagulation: SCDs. Monitor for any signs of DVT  -Dopplers negative for DVT 3. Pain Management/spasticity:   -Continue baclofen 20 mg 3 times daily  -PRAFO RLE  -ROM with therapy, family 4. Mood: Provide emotional support 5. Neuropsych: This patient iscapable of making decisions on hisown behalf. 6. Skin/Wound Care: rash resolved   -keep skin dry 7. Fluids/Electrolytes/Nutrition:     -BMP within acceptable range on 11/14 8.CAD with CABG.No chest pain or shortness of breath 9. Hypertension. Lopressor 25 mg daily   Vitals:   10/09/17 0535 10/09/17 1504  BP: 121/60 117/66  Pulse: 75 75  Resp: 18 18  Temp: 97.6 F (36.4 C) (!) 97.5 F (36.4 C)  SpO2: 91% 94%    Controlled on 11/17 10.COPD/tobacco abuse. Counseling.Continue inhalers  -off oxygen 11. Hyponatremia: Resolved 12. Mild leukocytosis:  Resolved 13. Right knee pain with edema  Xray reviewed, showing mild degenerative changes in the patellofemoral joint  Voltaren gel PRN  Improving 14. Cough   Productive, likely secondary to COPD   CXR negative for acute change  LOS (Days) 30 A FACE TO FACE EVALUATION WAS PERFORMED  Ankit Karis JubaAnil Patel, MD 10/09/2017 3:15 PM

## 2017-10-10 DIAGNOSIS — I25810 Atherosclerosis of coronary artery bypass graft(s) without angina pectoris: Secondary | ICD-10-CM

## 2017-10-10 NOTE — Progress Notes (Signed)
Physical Therapy Session Note  Patient Details  Name: Jaime Pierce MRN: [id number] Date of Birth: [birth year]  Today's Date: 10/10/2017 PT Individual Time: 1102-1158 PT Individual Time Calculation (min): 56 min   Short Term Goals: Week 5:  PT Short Term Goal 1 (Week 5): =LTG due to estimated LOS  Skilled Therapeutic Interventions/Progress Updates:    Pt seated in w/c upon PT arrival, agreeable to therapy tx and denies pain. Pt transported to gym in w/c total assist. [name] present for entire session. Session focused on gait training. Pt ambulated 2 x 68 ft using RW and min assist in a closed environment without distractions, verbal cues given for increased R step length, R foot placement to prevent scissoring, R toe clearance and minimizing R lateral lean. Pt ambulated x 70 ft with RW and min assist in a closed environment without verbal cues to assess for carry over, pt with improved gait overall and able to self correct scissoring and short steps. Pt ambulated x70 ft with RW and min - mod assist, open environment with distractions introduced during ambulation, pt with difficulty focusing on gait mechanics with increased stimuli. Pt left seated in w/c at end of session with needs in reach.   Therapy Documentation Precautions:  Precautions Precautions: Fall Precaution Comments: right sided weakness, right inattention Restrictions Weight Bearing Restrictions: No   See Function Navigator for Current Functional Status.   Therapy/Group: Individual Therapy  Cresenciano GenreEmily van Schagen, PT, DPT 10/10/2017, 2:15 PM

## 2017-10-10 NOTE — Progress Notes (Signed)
Physical Therapy Session Note  Patient Details  Name: Jaime Pierce MRN: [id number] Date of Birth: 09/26/1946  Today's Date: 10/10/2017 PT Individual Time: 1400-1500 PT Individual Time Calculation (min): 60 min   Short Term Goals: Week 5:  PT Short Term Goal 1 (Week 5): =LTG due to estimated LOS  Skilled Therapeutic Interventions/Progress Updates: Pt received seated in w/c, denies pain and agreeable to treatment. Performed ascent/descent 4 steps 6" height with B handrails x2 trials. First trial performed forward step-to ascent with lead RLE, minA for powering through RLE; step-to descent forward with RLE lead; modA for RLE placement d/t scissoring. Second trial performed step-to ascent with LLE lead, assist for maintaining midline; Side stepping towards L for descent with modA for RLE eccentric control. Lateral step ups x10 reps BLE minA, cues for upright posture. Standing gastroc stretch x30 sec, x15 heel raises with cues for even weight bearing through LEs. Gait x15' minA with RW; pt with co pain at R thumb in hand splint. Coban applied to hand splint for comfort. Gait [phone] to return to room including several turns with minA/min guard overall; occasional cues for attention to RLE to reduce scissoring. Two minor LOBs when pt distracted by surroundings. Remained seated on EOB with wife and NT present at end of session, all needs in reach     Therapy Documentation Precautions:  Precautions Precautions: Fall Precaution Comments: right sided weakness, right inattention Restrictions Weight Bearing Restrictions: No   See Function Navigator for Current Functional Status.   Therapy/Group: Individual Therapy  [name] 10/10/2017, 3:01 PM

## 2017-10-10 NOTE — Progress Notes (Signed)
Occupational Therapy Weekly Progress Note  Patient Details  Name: Jaime Pierce MRN: [id number] Date of Birth: 02/27/1946  Beginning of progress report period: 10/01/17 End of progress report period: 10/10/17  Today's Date: 10/10/2017 OT Individual Time: 0856-1000 OT Individual Time Calculation (min): 64 min   Patient has met 2 of 3 short term goals.    Pt has made steady progress during this report period. He currently requires Mod A for bathroom transfers, Mod A for bathing at shower level, Mod A for UB/LB dressing and Max A for toileting tasks. His standing balance has improved enough for him to lift pants over hips during LB dressing, and to shower safely at sit<stand level. Functional gains have been achieved in his Rt arm and he is now using it as stabilizer/gross assist. He continues to be limited by tone abnormalities, limited ROM in elbow/wrist, apraxia, and general weakness. Pt continues to be motivated during tx sessions, and he and spouse verbalize that they actively work on therapeutic activities outside of tx. Caregiver education is ongoing and spouse is still present at most OT sessions. Pt is on track for LTG achievement. Continue POC.   Patient continues to demonstrate the following deficits: muscle weakness, muscle joint tightness and muscle paralysis, decreased cardiorespiratoy endurance, impaired timing and sequencing, abnormal tone, unbalanced muscle activation, decreased coordination and decreased motor planning, decreased midline orientation, decreased attention to right, decreased motor planning and ideational apraxia, decreased initiation, decreased problem solving, decreased safety awareness, decreased memory and delayed processing and decreased sitting balance, decreased standing balance, decreased postural control and hemiplegia and therefore will continue to benefit from skilled OT intervention to enhance overall performance with BADL.  Patient progressing toward  long term goals..  Continue plan of care.  OT Short Term Goals Week 4:  OT Short Term Goal 1 (Week 4): STGs=LTGs due to ELOS  Skilled Therapeutic Interventions/Progress Updates:    Tx focus on modified bathing/dressing, functional transfers, and Rt NMR.   Pt greeted EOB with spouse present, ready for shower. Pt completed stand step transfer with RW to w/c with Mod A. Able to side step for shower transfer to TTB. Rt NMR with use of bath mitt, pt able to wash up/down Lt arm and both thighs with instruction and initial HOH assist. Pt completing sit<stand with Rt grasp facilitated on grab bars (Mod A) while OT completed pericare. Mod cues for keeping Rt knee extended. Able to achieve figure 4 with L LE to wash his foot! Afterwards pt dressed w/c level at sink. Min-Mod A sit<stand and Mod A for LB dressing. Pt completed grooming/oral tasks w/c level at sink with Rt forearm weightbearing on sink (focusing on elbow extension), and also utilizing Rt hand as gross stabilizer/assist as needed. Pt left with spouse, finishing up UB dressing at sink.   Encouraged self organization/problem solving, and self sequencing throughout tx.  Therapy Documentation Precautions:  Precautions Precautions: Fall Precaution Comments: right sided weakness, right inattention Restrictions Weight Bearing Restrictions: No Pain: No c/o pain during tx    ADL:      See Function Navigator for Current Functional Status.  Therapy/Group: Individual Therapy   A  10/10/2017, 12:14 PM  

## 2017-10-10 NOTE — Progress Notes (Signed)
[place] PHYSICAL MEDICINE & REHABILITATION     PROGRESS NOTE   Subjective/Complaints: Patient seen lying in bed this morning. He states he slept well overnight. Wife bedside. She states that she wishes patient had therapies today. Patient appears to be pleased.  ROS: Denies nausea, vomiting, diarrhea, shortness of breath or chest pain   Objective: Vital Signs: Blood pressure 117/66, pulse 75, temperature (!) 97.5 F (36.4 C), temperature source Oral, resp. rate 18, height 6\' 1"  (1.854 m), weight 85 kg (187 lb 6.3 oz), SpO2 98 %. No results found. No results for input(s): WBC, HGB, HCT, PLT in the last 72 hours. No results for input(s): NA, K, CL, GLUCOSE, BUN, CREATININE, CALCIUM in the last 72 hours.  Invalid input(s): CO CBG (last 3)  No results for input(s): GLUCAP in the last 72 hours.  Wt Readings from Last 3 Encounters:  10/06/17 85 kg (187 lb 6.3 oz)  09/01/17 85.1 kg (187 lb 9.8 oz)  06/30/17 83 kg (183 lb)    Physical Exam:  Constitutional: He appears well-developed. NAD. HENT: Normocephalic. Atraumatic. Eyes: EOMare normal. No discharge. Cardiovascular: RRR. No JVD     Respiratory: CTA Bilaterally.  Normal effort   GI: BS +, non-distended.  MSK: Mild edema right knee. No tenderness Neurological: He is alertand oriented.  Motor: LUE/LLE: 5/5 proximal to distal  RUE: 2/5 shoulder abduction, 3+/5 elbow flexion/extension, 2+/5 hand grip  mAS 1/4 right elbow flexor RLE: 3 to 3+/5 HF, 3/5 KE and ankle PF/DF.    Skin.warm and dry.  Psych: Normal mood and behavior.     Assessment/Plan: 1. 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 Physiatrist is providing close team supervision and 24 hour management of active medical problems listed below. Physiatrist and rehab team continue to assess barriers to discharge/monitor patient  progress toward functional and medical goals.  Function:  Bathing Bathing position   Position: Shower  Bathing parts Body parts bathed by patient: Right arm, Chest, Abdomen, Front perineal area, Right upper leg, Left upper leg Body parts bathed by helper: Right lower leg, Left lower leg, Back, Buttocks, Left arm  Bathing assist Assist Level: (mod a)      Upper Body Dressing/Undressing Upper body dressing   What is the patient wearing?: Pull over shirt/dress     Pull over shirt/dress - Perfomed by patient: Thread/unthread left sleeve, Put head through opening Pull over shirt/dress - Perfomed by helper: Thread/unthread right sleeve, Pull shirt over trunk        Upper body assist Assist Level: (mod a)      Lower Body Dressing/Undressing Lower body dressing   What is the patient wearing?: Pants, Socks, Shoes   Underwear - Performed by helper: Thread/unthread right underwear leg, Thread/unthread left underwear leg, Pull underwear up/down Pants- Performed by patient: Thread/unthread left pants leg, Pull pants up/down Pants- Performed by helper: Thread/unthread right pants leg   Non-skid slipper socks- Performed by helper: Don/doff right sock, Don/doff left sock   Socks - Performed by helper: Don/doff right sock, Don/doff left sock Shoes - Performed by patient: Don/doff left shoe Shoes - Performed by helper: Don/doff right shoe          Lower body assist Assist for lower body dressing: (mod a)      Financial traderToileting Toileting Toileting activity did not occur: Safety/medical concerns   Toileting steps completed by helper: Performs perineal hygiene  Toileting Assistive Devices: Forensic scientistGrab bar or rail  Toileting assist Assist level: Two helpers   Transfers Chair/bed transfer   Chair/bed transfer method: Stand pivot Chair/bed transfer assist level: Touching or steadying assistance (Pt > 75%) Chair/bed transfer assistive device: Armrests Mechanical lift: Stedy   Locomotion Ambulation  Ambulation activity did not occur: Safety/medical concerns   Max distance: 30 Assist level: Moderate assist (Pt 50 - 74%)   Wheelchair   Type: Manual Max wheelchair distance: 25 Assist Level: Moderate assistance (Pt 50 - 74%)  Cognition Comprehension Comprehension assist level: Follows basic conversation/direction with extra time/assistive device  Expression Expression assist level: Expresses basic 90% of the time/requires cueing < 10% of the time.  Social Interaction Social Interaction assist level: Interacts appropriately 90% of the time - Needs monitoring or encouragement for participation or interaction.  Problem Solving Problem solving assist level: Solves basic 90% of the time/requires cueing < 10% of the time  Memory Memory assist level: Recognizes or recalls 50 - 74% of the time/requires cueing 25 - 49% of the time   Medical Problem List and Plan: 1. 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  2. DVT Prophylaxis/Anticoagulation: SCDs. Monitor for any signs of DVT  -Dopplers negative for DVT 3. Pain Management/spasticity:   -Continue baclofen 20 mg 3 times daily  -PRAFO RLE  -ROM with therapy, family 4. Mood: Provide emotional support 5. Neuropsych: This patient iscapable of making decisions on hisown behalf. 6. Skin/Wound Care: Routine skin care  7. Fluids/Electrolytes/Nutrition:     -BMP within acceptable range on 11/14 8.CAD with CABG.No chest pain or shortness of breath at present 9. Hypertension. Lopressor 25 mg daily   Vitals:   10/09/17 2030 10/10/17 0730  BP:    Pulse:    Resp:    Temp:    SpO2: 96% 98%    Controlled on 11/18 10.COPD/tobacco abuse. Counseling.Continue inhalers  -off oxygen 11. Hyponatremia: Resolved 12. Mild leukocytosis: Resolved 13. Right knee  pain with edema  Xray reviewed, showing mild degenerative changes in the patellofemoral joint  Voltaren gel PRN  Improving 14. Cough   Productive, likely secondary to COPD   CXR negative for acute change  LOS (Days) 31 A FACE TO FACE EVALUATION WAS PERFORMED  Wilba Mutz Karis JubaAnil Rey Fors, MD 10/10/2017 8:41 AM

## 2017-10-10 NOTE — Plan of Care (Signed)
  Progressing Consults RH STROKE PATIENT EDUCATION Description See Patient Education module for education specifics   10/10/2017 1418 - Progressing by Melina ModenaBurchett, Marylene Masek, RN Nutrition Consult-if indicated 10/10/2017 1418 - Progressing by Melina ModenaBurchett, Tiawana Forgy, RN RH BOWEL ELIMINATION RH STG MANAGE BOWEL WITH ASSISTANCE Description STG Manage Bowel with min Assistance.   10/10/2017 1418 - Progressing by Melina ModenaBurchett, Tianne Plott, RN RH STG MANAGE BOWEL W/MEDICATION W/ASSISTANCE Description STG Manage Bowel with Medication with min Assistance.   10/10/2017 1418 - Progressing by Melina ModenaBurchett, Syana Degraffenreid, RN RH BLADDER ELIMINATION RH STG MANAGE BLADDER WITH ASSISTANCE Description STG Manage Bladder With min Assistance   10/10/2017 1418 - Progressing by Melina ModenaBurchett, Nyles Mitton, RN RH SKIN INTEGRITY RH STG SKIN FREE OF INFECTION/BREAKDOWN Description Prevent skin breakdown or infection with mod assist  10/10/2017 1418 - Progressing by Melina ModenaBurchett, Kiyra Slaubaugh, RN RH SAFETY RH STG ADHERE TO SAFETY PRECAUTIONS W/ASSISTANCE/DEVICE Description STG Adhere to Safety Precautions With mod I Assistance/Device.   10/10/2017 1418 - Progressing by Melina ModenaBurchett, Yardley Beltran, RN RH PAIN MANAGEMENT RH STG PAIN MANAGED AT OR BELOW PT'S PAIN GOAL Description Less than 3 out of 10  10/10/2017 1418 - Progressing by Melina ModenaBurchett, Laurent Cargile, RN

## 2017-10-11 ENCOUNTER — Inpatient Hospital Stay (HOSPITAL_COMMUNITY): Payer: Medicare HMO | Admitting: Speech Pathology

## 2017-10-11 ENCOUNTER — Inpatient Hospital Stay (HOSPITAL_COMMUNITY): Payer: Medicare HMO | Admitting: Physical Therapy

## 2017-10-11 ENCOUNTER — Inpatient Hospital Stay (HOSPITAL_COMMUNITY): Payer: Medicare HMO | Admitting: Occupational Therapy

## 2017-10-11 NOTE — Progress Notes (Signed)
Physical Therapy Session Note  Patient Details  Name: Jaime FuelRichard A Paci MRN: [id number] Date of Birth: 09/29/1946  Today's Date: 10/11/2017 PT Individual Time: 1030-1130 PT Individual Time Calculation (min): 60 min   Short Term Goals: Week 5:  PT Short Term Goal 1 (Week 5): =LTG due to estimated LOS  Skilled Therapeutic Interventions/Progress Updates: Pt received seated in w/c, denies pain and agreeable to treatment. Gait x130' with RW and min guard; wife practiced providing min guard and cues as necessary to prevent LOB to R side. Stand pivot transfer standard chair >w/c with wife providing min guard and cueing for upright posture. Sit <>stand at toilet with grab bar and close S; wife provided min guard for balance and assist with clothing management. Pt able to demo min guard balance while releasing LUE from grab bar to flush toilet and assist with pulling pants up. Lateral step ups x10 reps BLE on 6" step. Performed ascent four 6-inch steps with B handrails forward with step-to pattern leading with LLE Sideways descent with RLE lead and increased time required d/t repositioning LEs for proper foot placement on each step. Stand pivot transfer no AD with minA. Sit<>stand with UEs across chest and mat elevated initially, progressed to low seat height equal to w/c seat height; min tactile cues for anterior weight shift to initiate sit>stand, more assist required as mat lowered. Returned to room totalA by wife at end of session.      Therapy Documentation Precautions:  Precautions Precautions: Fall Precaution Comments: right sided weakness, right inattention Restrictions Weight Bearing Restrictions: No   See Function Navigator for Current Functional Status.   Therapy/Group: Individual Therapy  [name] 10/11/2017, 11:48 AM

## 2017-10-11 NOTE — Progress Notes (Signed)
Occupational Therapy Session Note  Patient Details  Name: Jaime Pierce MRN: [id number] Date of Birth: 14-Jun-1946  Today's Date: 10/11/2017 OT Individual Time: [id number] OT Individual Time Calculation (min): 75 min    Short Term Goals: Week 4:  OT Short Term Goal 1 (Week 4): STGs=LTGs due to ELOS  Skilled Therapeutic Interventions/Progress Updates:    Upon entering the room, pt seated in wheelchair at sink brushing teeth with supervision and wife present. Pt with no c/o pain this session. Pt propelled wheelchair with B LE's this session [facility]' [facility]. Pt engaged in dynavision task in standing with reaching in all planes with L UE and steady assistance for balance. Pt able to hit 62 targets and 1.94 seconds for reaction time while standing for 2 minutes. Pt seated in wheelchair with bottom half of dynavision on and pt able to hit 7 targets with R UE and assistance from therapist. Pt engaged in R UE NMR by reaching for cones on table and addressing functional grip. Pt able to grasp cones from side and move across table to directed target. Pt also stacking cones with assistance from therapist. Pt ambulates 100' towards room with RW and min A on carpeted surface. Pt seated in wheelchair with call bell and all needed items within reach upon exiting the room.    Therapy Documentation Precautions:  Precautions Precautions: Fall Precaution Comments: right sided weakness, right inattention Restrictions Weight Bearing Restrictions: No General:   Vital Signs: Therapy Vitals Temp: 98.1 F (36.7 C) Temp Source: Oral Pulse Rate: 82 Resp: 18 BP: 116/67 Patient Position (if appropriate): Sitting Oxygen Therapy SpO2: 94 % O2 Device: Not Delivered Pain:   ADL:   Vision   Perception    Praxis   Exercises:   Other Treatments:    See Function Navigator for Current Functional Status.   Therapy/Group: Individual Therapy  Alen BleacherBradsher, Sirenia Whitis P 10/11/2017, 4:31 PM

## 2017-10-11 NOTE — Progress Notes (Signed)
Nutrition Follow-up  DOCUMENTATION CODES:   Not applicable  INTERVENTION:  Continue Ensure Enlive po BID, each supplement provides 350 kcal and 20 grams of protein.  Encourage adequate PO intake.   NUTRITION DIAGNOSIS:   Inadequate oral intake related to poor appetite as evidenced by per patient/family report; improved  GOAL:   Patient will meet greater than or equal to 90% of their needs; met  MONITOR:   PO intake, Supplement acceptance, Labs, Weight trends, Skin, I & O's  REASON FOR ASSESSMENT:   Malnutrition Screening Tool    ASSESSMENT:   71 y.o.right handed male with history of CAD with CABG, hypertension, tobacco abuse. Presented 09/01/2017 with acute onset of right sided weakness and facial droop. CT/MRI showed a 4.2 x 2.5 x 3.1 cm left parietal lobe intraparenchymal hematoma. Small-volume acute subarachnoid hemorrhage was in the adjacent left parietal lobe as well as at the anterior right frontal lobe.   Meal completion has been mostly 100%. Pt currently has Ensure ordered and has been consuming them. Intake adequate. RD to continue with current orders.   Diet Order:  Diet Heart Room service appropriate? Yes; Fluid consistency: Thin  EDUCATION NEEDS:   No education needs identified at this time  Skin:  Skin Assessment: Reviewed RN Assessment  Last BM:  11/18  Height:   Ht Readings from Last 1 Encounters:  09/09/17 6' 1"  (1.854 m)    Weight:   Wt Readings from Last 1 Encounters:  10/06/17 187 lb 6.3 oz (85 kg)    Ideal Body Weight:  83.6 kg  BMI:  Body mass index is 24.72 kg/m.  Estimated Nutritional Needs:   Kcal:  2000-2200  Protein:  90-100 grams  Fluid:  2-2.2 L/day    Corrin Parker, MS, RD, LDN Pager # [phone] After hours/ weekend pager # [phone]

## 2017-10-11 NOTE — Progress Notes (Signed)
[employer] PHYSICAL MEDICINE & REHABILITATION     PROGRESS NOTE   Subjective/Complaints:  Up in chair eating breakfast.  Proud to tell me that he walked 165 feet in therapy over the weekend.  Trying to use his right hand is more  ROS: pt denies nausea, vomiting, diarrhea, cough, shortness of breath or chest pain   Objective: Vital Signs: Blood pressure 134/75, pulse 65, temperature (!) 97.5 F (36.4 C), temperature source Oral, resp. rate 17, height 6\' 1"  (1.854 m), weight 85 kg (187 lb 6.3 oz), SpO2 92 %. No results found. No results for input(s): WBC, HGB, HCT, PLT in the last 72 hours. No results for input(s): NA, K, CL, GLUCOSE, BUN, CREATININE, CALCIUM in the last 72 hours.  Invalid input(s): CO CBG (last 3)  No results for input(s): GLUCAP in the last 72 hours.  Wt Readings from Last 3 Encounters:  10/06/17 85 kg (187 lb 6.3 oz)  09/01/17 85.1 kg (187 lb 9.8 oz)  06/30/17 83 kg (183 lb)    Physical Exam:  Constitutional: He appears well-developed. NAD. HENT: Normocephalic. Atraumatic. Eyes: EOMare normal. No discharge. Cardiovascular: RRR without murmur. No JVD      Respiratory: CTA Bilaterally without wheezes or rales. Normal effort  GI: BS +, non-distended.  MSK: Mild edema right knee. No tenderness Neurological: He is alertand oriented.  Motor: LUE/LLE: 5/5 proximal to distal  RUE: 2/5 shoulder abduction, 3+/5 elbow flexion/extension, 2+/5 hand grip---slow  mAS 1/4 right elbow flexor RLE: 3 to 3+/5 HF, 3/5 KE and ankle PF/DF.    Skin.warm and dry.  Psych: Normal mood and behavior.     Assessment/Plan: 1. 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 Physiatrist is providing close team supervision and 24 hour management of active medical problems listed below. Physiatrist and rehab team continue to assess barriers to  discharge/monitor patient progress toward functional and medical goals.  Function:  Bathing Bathing position   Position: Shower  Bathing parts Body parts bathed by patient: Right arm, Chest, Abdomen, Front perineal area, Right upper leg, Left upper leg, Left arm, Left lower leg Body parts bathed by helper: Buttocks, Right lower leg, Back  Bathing assist Assist Level: (mod a)      Upper Body Dressing/Undressing Upper body dressing   What is the patient wearing?: Pull over shirt/dress     Pull over shirt/dress - Perfomed by patient: Thread/unthread left sleeve, Put head through opening Pull over shirt/dress - Perfomed by helper: Thread/unthread right sleeve, Pull shirt over trunk        Upper body assist Assist Level: (mod a)      Lower Body Dressing/Undressing Lower body dressing   What is the patient wearing?: Pants, Socks, Shoes   Underwear - Performed by helper: Thread/unthread right underwear leg, Thread/unthread left underwear leg, Pull underwear up/down Pants- Performed by patient: Thread/unthread left pants leg Pants- Performed by helper: Thread/unthread right pants leg, Pull pants up/down   Non-skid slipper socks- Performed by helper: Don/doff right sock, Don/doff left sock Socks - Performed by patient: Don/doff left sock Socks - Performed by helper: Don/doff right sock Shoes - Performed by patient: Don/doff left shoe Shoes - Performed by helper: Don/doff right shoe          Lower body assist Assist for lower body dressing: (Mod A)      Toileting Toileting Toileting activity did not occur: Safety/medical  concerns   Toileting steps completed by helper: Performs perineal hygiene Toileting Assistive Devices: Grab bar or rail  Toileting assist Assist level: Two helpers   Transfers Chair/bed transfer   Chair/bed transfer method: Ambulatory Chair/bed transfer assist level: Touching or steadying assistance (Pt > 75%) Chair/bed transfer assistive device:  Armrests, Biochemist, clinicalWalker Mechanical lift: Magazine features editortedy   Locomotion Ambulation Ambulation activity did not occur: Safety/medical concerns   Max distance: 163 Assist level: Touching or steadying assistance (Pt > 75%)   Wheelchair   Type: Manual Max wheelchair distance: 25 Assist Level: Moderate assistance (Pt 50 - 74%)  Cognition Comprehension Comprehension assist level: Follows basic conversation/direction with extra time/assistive device  Expression Expression assist level: Expresses basic 90% of the time/requires cueing < 10% of the time.  Social Interaction Social Interaction assist level: Interacts appropriately 90% of the time - Needs monitoring or encouragement for participation or interaction.  Problem Solving Problem solving assist level: Solves basic 90% of the time/requires cueing < 10% of the time  Memory Memory assist level: Recognizes or recalls 50 - 74% of the time/requires cueing 25 - 49% of the time   Medical Problem List and Plan: 1. Right hemiplegiasecondary to left parietal intraparenchymal hemorrhage likely due to hypertension and small vessel disease  Continue CIR  -making steady gains toward discharge.  2. DVT Prophylaxis/Anticoagulation: SCDs. Monitor for any signs of DVT  -Dopplers negative for DVT 3. Pain Management/spasticity:   -Continue baclofen 20 mg 3 times daily  -PRAFO RLE  -ROM with therapy, family 4. Mood: Provide emotional support 5. Neuropsych: This patient iscapable of making decisions on hisown behalf. 6. Skin/Wound Care: Routine skin care  7. Fluids/Electrolytes/Nutrition:     -BMP within acceptable range on 11/14 8.CAD with CABG.No chest pain or shortness of breath at present 9. Hypertension. Lopressor 25 mg daily   Vitals:   10/10/17 2205 10/11/17 0445  BP:  134/75  Pulse:  65  Resp:  17  Temp:  (!) 97.5 F (36.4 C)  SpO2: 91% 92%    Controlled on 11/18 10.COPD/tobacco abuse. Counseling.Continue inhalers  -off oxygen 11. Hyponatremia:  Resolved 12. Mild leukocytosis: Resolved 13. Right knee pain with edema  Xray with mild degenerative changes in the patellofemoral joint  Voltaren gel PRN  Improving 14. Cough   Productive, likely secondary to COPD   CXR negative for acute change    LOS (Days) 32 A FACE TO FACE EVALUATION WAS PERFORMED  Jaime RogueSWARTZ,Lili Harts T, MD 10/11/2017 9:06 AM

## 2017-10-11 NOTE — Progress Notes (Signed)
Speech Language Pathology Daily Session Note  Patient Details  Name: Jaime Pierce MRN: [id number] Date of Birth: 08-Dec-1945  Today's Date: 10/11/2017 SLP Individual Time: 1300-1345 SLP Individual Time Calculation (min): 45 min  Short Term Goals: Week 5: SLP Short Term Goal 1 (Week 5): Patient will recall new, daily information with Min A multimodal cues.  SLP Short Term Goal 2 (Week 5): Patient will self-monitor and correct errors during functional tasks with supervision A verbal cues.  SLP Short Term Goal 3 (Week 5): Patient will follow multi step directions with 75% accuracy and supervision verbal cues.   Skilled Therapeutic Interventions: Skilled treatment session focused on cognitive goals. SLP facilitated session by providing Min A verbal cues for recall of functional information and Mod A verbal cues for recall of his current medications and verbal reasoning in regards to medication administration. Patient left upright in wheelchair with all needs within reach. Continue with current plan of care.      Function:  Cognition Comprehension Comprehension assist level: Follows basic conversation/direction with extra time/assistive device  Expression   Expression assist level: Expresses basic 90% of the time/requires cueing < 10% of the time.  Social Interaction Social Interaction assist level: Interacts appropriately 90% of the time - Needs monitoring or encouragement for participation or interaction.  Problem Solving Problem solving assist level: Solves basic 90% of the time/requires cueing < 10% of the time  Memory Memory assist level: Recognizes or recalls 50 - 74% of the time/requires cueing 25 - 49% of the time    Pain No/Denies Pain   Therapy/Group: Individual Therapy  Jaime Pierce 10/11/2017, 3:22 PM

## 2017-10-12 ENCOUNTER — Inpatient Hospital Stay (HOSPITAL_COMMUNITY): Payer: Medicare HMO

## 2017-10-12 ENCOUNTER — Inpatient Hospital Stay (HOSPITAL_COMMUNITY): Payer: Medicare HMO | Admitting: Occupational Therapy

## 2017-10-12 ENCOUNTER — Inpatient Hospital Stay (HOSPITAL_COMMUNITY): Payer: Medicare HMO | Admitting: Physical Therapy

## 2017-10-12 NOTE — Progress Notes (Signed)
[facility]IC[facility]ILITATION     PROGRESS NOTE   Subjective/Complaints: In shower with OT this morning.  No new complaints.  ROS: pt denies nausea, vomiting, diarrhea, cough, shortness of breath or chest pain   Objective: Vital Signs: Blood pressure (!) 143/74, pulse 73, temperature 97.8 F (36.6 C), temperature source Oral, resp. rate 20, height 6\' 1"  (1.854 m), weight 85 kg (187 lb 6.3 oz), SpO2 95 %. No results found. No results for input(s): WBC, HGB, HCT, PLT in the last 72 hours. No results for input(s): NA, K, CL, GLUCOSE, BUN, CREATININE, CALCIUM in the last 72 hours.  Invalid input(s): CO CBG (last 3)  No results for input(s): GLUCAP in the last 72 hours.  Wt Readings from Last 3 Encounters:  10/06/17 85 kg (187 lb 6.3 oz)  09/01/17 85.1 kg (187 lb 9.8 oz)  06/30/17 83 kg (183 lb)    Physical Exam:  Constitutional: He appears well-developed. NAD. HENT: Normocephalic. Atraumatic. Eyes: EOMare normal. No discharge. Cardiovascular: RRR without murmur. No JVD    Respiratory: normal effort GI: BS +, non-distended.  MSK: Mild edema right knee. No tenderness Neurological: He is alertand oriented.  Motor: LUE/LLE: 5/5 proximal to distal  RUE: 2/5 shoulder abduction, 3+/5 elbow flexion/extension, 2+/5 hand grip--apraxic mAS 1/4 right elbow flexor RLE: 3 to 3+/5 HF, 3/5 KE and ankle PF/DF.    Skin.warm and dry.  Psych: Normal mood and behavior.     Assessment/Plan: 1. 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 Physiatrist is providing close team supervision and 24 hour management of active medical problems listed below. Physiatrist and rehab team continue to assess barriers to discharge/monitor patient progress toward functional and medical goals.  Function:  Bathing Bathing position   Position: Shower   Bathing parts Body parts bathed by patient: Right arm, Chest, Abdomen, Front perineal area, Right upper leg, Left upper leg, Left arm, Left lower leg Body parts bathed by helper: Buttocks, Right lower leg, Back  Bathing assist Assist Level: (mod a)      Upper Body Dressing/Undressing Upper body dressing   What is the patient wearing?: Pull over shirt/dress     Pull over shirt/dress - Perfomed by patient: Thread/unthread left sleeve, Put head through opening Pull over shirt/dress - Perfomed by helper: Thread/unthread right sleeve, Pull shirt over trunk        Upper body assist Assist Level: (mod a)      Lower Body Dressing/Undressing Lower body dressing   What is the patient wearing?: Pants, Socks, Shoes   Underwear - Performed by helper: Thread/unthread right underwear leg, Thread/unthread left underwear leg, Pull underwear up/down Pants- Performed by patient: Thread/unthread left pants leg Pants- Performed by helper: Thread/unthread right pants leg, Pull pants up/down   Non-skid slipper socks- Performed by helper: Don/doff right sock, Don/doff left sock Socks - Performed by patient: Don/doff left sock Socks - Performed by helper: Don/doff right sock Shoes - Performed by patient: Don/doff left shoe Shoes - Performed by helper: Don/doff right shoe          Lower body assist Assist for lower body dressing: (Mod A)      Toileting Toileting Toileting activity did not occur: Safety/medical concerns   Toileting steps completed by helper: Performs perineal hygiene Toileting Assistive Devices: Grab bar or rail  Toileting assist Assist level: Two helpers   Transfers Chair/bed transfer  Chair/bed transfer method: Stand pivot Chair/bed transfer assist level: Touching or steadying assistance (Pt > 75%) Chair/bed transfer assistive device: Armrests Mechanical lift: Stedy   Locomotion Ambulation Ambulation activity did not occur: Safety/medical concerns   Max distance:  100' Assist level: Touching or steadying assistance (Pt > 75%)   Wheelchair   Type: Manual Max wheelchair distance: 25 Assist Level: Moderate assistance (Pt 50 - 74%)  Cognition Comprehension Comprehension assist level: Follows basic conversation/direction with extra time/assistive device  Expression Expression assist level: Expresses basic 90% of the time/requires cueing < 10% of the time.  Social Interaction Social Interaction assist level: Interacts appropriately 90% of the time - Needs monitoring or encouragement for participation or interaction.  Problem Solving Problem solving assist level: Solves basic 90% of the time/requires cueing < 10% of the time  Memory Memory assist level: Recognizes or recalls 50 - 74% of the time/requires cueing 25 - 49% of the time   Medical Problem List and Plan: 1. 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 DVT Prophylaxis/Anticoagulation: SCDs. Monitor for any signs of DVT  -Dopplers negative for DVT 3. Pain Management/spasticity:   -Continue baclofen 20 mg 3 times daily with good results  -PRAFO RLE while in bed  -ROM with therapy, family 4. Mood: Provide emotional support 5. Neuropsych: This patient iscapable of making decisions on hisown behalf. 6. Skin/Wound Care: Routine skin care  7. Fluids/Electrolytes/Nutrition:     -BMP within acceptable range on 11/14--recheck tomorrow 8.CAD with CABG.No chest pain or shortness of breath at present 9. Hypertension. Lopressor 25 mg daily   Vitals:   10/11/17 1500 10/12/17 0500  BP: 116/67 (!) 143/74  Pulse: 82 73  Resp: 18 20  Temp: 98.1 F (36.7 C) 97.8 F (36.6 C)  SpO2: 94% 95%    Controlled on 11/18 10.COPD/tobacco abuse. Counseling.Continue inhalers  -off oxygen without issue 11. Hyponatremia: Resolved 12. Mild leukocytosis: Resolved 13. Right knee pain with  edema  Xray with mild degenerative changes in the patellofemoral joint  Voltaren gel PRN  Improving 14. Cough   Productive, likely secondary to COPD   CXR negative for acute change    LOS (Days) 33 A FACE TO FACE EVALUATION WAS PERFORMED  Ranelle OysterSWARTZ,Correll Denbow T, MD 10/12/2017 8:49 AM

## 2017-10-12 NOTE — Progress Notes (Signed)
Physical Therapy Session Note  Patient Details  Name: Jaime Pierce MRN: [id number] Date of Birth: 1946-07-01  Today's Date: 10/12/2017 PT Individual Time: 1430-1545 PT Individual Time Calculation (min): 75 min   Short Term Goals: Week 5:  PT Short Term Goal 1 (Week 5): =LTG due to estimated LOS  Skilled Therapeutic Interventions/Progress Updates: Pt received in w/c, denies pain. Transfer w/c <>simulated car set at 32" to simulate pt's car; minA provided by wife with RW into car, pt able to lift LEs in/out of car with increased time and no assist. Stand pivot no AD to return to w/c as pt and therapist felt like walker got in the way and made wife less able to provide assist; minA overall with wife providing cueing as necessary for technique and safety. [name] requesting to try transfer to their vehicle. While wife getting car, pt and therapist ambulated up/down ramp with RW and min guard; cues for RLE stance phase during descent and maintaining RW closer to body. Performed stand pivot w/c <>car no AD with wife providing minA. Discussed follow up therapy recommendations, equipment needs, handicap pass; all info passed on to CSW at end of session. Performed side stepping R w/c >couch, Stand pivot transfer couch>w/c all with minA and moderate verbal cues for technique. Educated wife in simple one step commands, external cues more effective than internal. Returned pt to 16" width w/c to assess fit and comfort to determine appropriate size for w/c to take home. Returned to room totalA; remained seated in w/c with handoff to CSW.     Therapy Documentation Precautions:  Precautions Precautions: Fall Precaution Comments: right sided weakness, right inattention Restrictions Weight Bearing Restrictions: No   See Function Navigator for Current Functional Status.   Therapy/Group: Individual Therapy  [name] 10/12/2017, 3:52 PM

## 2017-10-12 NOTE — Progress Notes (Signed)
Speech Language Pathology Daily Session Note  Patient Details  Name: Jaime Pierce MRN: [id number] Date of Birth: 1946-05-12  Today's Date: 10/12/2017 SLP Individual Time: [id number] SLP Individual Time Calculation (min): 30 min  Short Term Goals: Week 5: SLP Short Term Goal 1 (Week 5): Patient will recall new, daily information with Min A multimodal cues.  SLP Short Term Goal 2 (Week 5): Patient will self-monitor and correct errors during functional tasks with supervision A verbal cues.  SLP Short Term Goal 3 (Week 5): Patient will follow multi step directions with 75% accuracy and supervision verbal cues.   Skilled Therapeutic Interventions: Pt seen for skilled ST intervention targeting cognition. SLP facilitated this session with use of mod A cues for recall of daily events including date and upcoming holiday date/DOW. Pt re-educated re: compensatory memory strategies (e.g. WARM strategies) with max A for pt to write down reminder to ask MD question re: his current BP medication + mod-max A to place in a location in room that would be visible. Pt benefited from min A to complete pill box organizer appropriately for 4 different medications. At the end of the session, pt left in room with wife with call bell within reach. Continue current plan of care.   Function:  Cognition Comprehension Comprehension assist level: Follows basic conversation/direction with extra time/assistive device  Expression   Expression assist level: Expresses basic needs/ideas: With extra time/assistive device  Social Interaction Social Interaction assist level: Interacts appropriately 90% of the time - Needs monitoring or encouragement for participation or interaction.  Problem Solving Problem solving assist level: Solves basic 75 - 89% of the time/requires cueing 10 - 24% of the time  Memory Memory assist level: Recognizes or recalls 50 - 74% of the time/requires cueing 25 - 49% of the time    Pain Pain  Assessment Pain Assessment: 0-10 Pain Score: 5  Pain Type: Chronic pain Pain Location: Leg Pain Orientation: Right Pain Descriptors / Indicators: Aching  Therapy/Group: Individual Therapy  Jaime Pierce 10/12/2017, 11:54 AM

## 2017-10-12 NOTE — Plan of Care (Signed)
  RH Bed Mobility LTG Patient will perform bed mobility with assist (PT) Description LTG: Patient will perform bed mobility with assistance, with/without cues (PT).  10/12/2017 0738 by Vista Lawmanygielski, Doria Fern J, PT Flowsheets Taken 10/12/2017 0738  LTG: Pt will perform bed mobility with assistance level of 5-Supervision/cueing (upgraded d/t progress) Note Upgraded d/t progress   RH Car Transfers LTG Patient will perform car transfers with assist (PT) Description LTG: Patient will perform car transfers with assistance (PT).  10/12/2017 0738 by Vista Lawmanygielski, Mariem Skolnick J, PT Flowsheets Taken 10/12/2017 0738  LTG: Pt will perform car transfers with assist: 4 - Minimal Assistance (upgraded d/t progress) Note Upgraded d/t progress   RH Ambulation LTG Patient will ambulate in controlled environment (PT) Description LTG: Patient will ambulate in a controlled environment, # of feet with assistance (PT).  10/12/2017 0738 by Vista Lawmanygielski, Matias Thurman J, PT Flowsheets Taken 10/12/2017 0738  LTG: Pt will ambulate in controlled environ  assist needed: 4 - Minimal Assistance (upgraded d/t progress) LTG: Ambulation distance in controlled environment 150' LRAD LTG: Pt will ambulate in controlled environ cueing needed: Minimal cueing Note Upgraded d/t progress

## 2017-10-12 NOTE — Progress Notes (Signed)
Occupational Therapy Session Note  Patient Details  Name: Jaime Pierce MRN: [id number] Date of Birth: 03/29/1946  Today's Date: 10/12/2017 OT Individual Time: 0720-0820 and 1045- 1200 OT Individual Time Calculation (min): 60 min    Short Term Goals: Week 4:  OT Short Term Goal 1 (Week 4): STGs=LTGs due to ELOS  Skilled Therapeutic Interventions/Progress Updates:    Session 1: Upon entering the room, pt seated on EOB finishing breakfast with wife, Jaime Pierce, present. Skilled OT intervention with focus on hands on caregiver training for self care tasks. OT providing cues to caregiver for technique and then caregiver demonstrated knowledge and understanding by assisting pt with various tasks. Pt ambulating with RW into bathroom for toileting. Pt having LOB during toilet transfer requiring mod A to correct. OT educated pt and caregiver on communication for safety. They verbalized understanding. Jaime Pierce assisted pt with hygiene while pt standing with min A for balance. Pt seated on TTB for bathing at shower level with use of bath mit on R UE to increase I with self care. Pt standing with min A and caregiver washing buttocks. Pt donned shorts while seated on edge of TTB with caregiver providing cues for hemiplegic dressing. She assisted pt back to wheelchair for UB self care and grooming. All needs within reach upon therapist exiting the room.   Session 2: Upon entering the room, pt's caregiver present for continued family education. Skilled OT with focus on family training for functional transfers, ambulation, and side stepping in simulated home environment. Caregiver assisted pt to ADL apartment via wheelchair for time management. Pt practice transfers from recliner chair and standard bed with assistance from wife. Pt required mod lifting assistance to stand from recliner secondary to it being much lower and also rocking. However, it is similar to pt's favorite chair. Pt performed bed mobility from flat  bed with overall supervision with min cues for technique. Pt performed side stepping around bed, on carpet, in both directions with RW and steady assistance for balance. Pt ambulating 100' towards room with caregiver providing min A and cues for technique. Pt returning to wheelchair secondary to fatigue and returning to room. Call bell and all needed items within reach upon exiting the room.   Therapy Documentation Precautions:  Precautions Precautions: Fall Precaution Comments: right sided weakness, right inattention Restrictions Weight Bearing Restrictions: No   Pain: Pain Assessment Pain Assessment: 0-10 Pain Score: 5  Pain Type: Chronic pain Pain Location: Leg Pain Orientation: Right Pain Descriptors / Indicators: Aching  See Function Navigator for Current Functional Status.   Therapy/Group: Individual Therapy  Alen BleacherBradsher, Rawlins Stuard P 10/12/2017, 2:18 PM

## 2017-10-12 NOTE — Plan of Care (Signed)
  RH Ambulation LTG Patient will ambulate in home environment (PT) Description LTG: Patient will ambulate in home environment, # of feet with assistance (PT). 10/12/2017 0739 by Vista Lawmanygielski, Alphonza Tramell J, PT Flowsheets Taken 10/12/2017 0739  LTG: Pt will ambulate in home environ  assist needed: 4 - Minimal Assistance (goal added d/t progress) LTG: Ambulation distance in home environment 25' LRAD LTG: Pt will ambulate in home environ cueing needed: Minimal cueing Note Goal added d/t progress

## 2017-10-12 NOTE — Progress Notes (Signed)
Pt resting in bed quietly. Easily aroused. C/o right leg pain that he describes as a cramping, spasmotic pain. Pt / family educated on the need to reposition the leg on a pillow to assist with discomfort, uses of voltaren gel to apply topically to leg and the taking of tylenol to assist also. Pt is encouraged to speak with provider to ensure that pain management is adequate. Safety maintained. callbell within reach. Will continue to monitor.

## 2017-10-13 ENCOUNTER — Inpatient Hospital Stay (HOSPITAL_COMMUNITY): Payer: Medicare HMO | Admitting: Occupational Therapy

## 2017-10-13 ENCOUNTER — Inpatient Hospital Stay (HOSPITAL_COMMUNITY): Payer: Medicare HMO | Admitting: Physical Therapy

## 2017-10-13 ENCOUNTER — Inpatient Hospital Stay (HOSPITAL_COMMUNITY): Payer: Medicare HMO

## 2017-10-13 LAB — CBC
HCT: 38.6 % — ABNORMAL LOW (ref 39.0–52.0)
HEMOGLOBIN: 12.6 g/dL — AB (ref 13.0–17.0)
MCH: 30.6 pg (ref 26.0–34.0)
MCHC: 32.6 g/dL (ref 30.0–36.0)
MCV: 93.7 fL (ref 78.0–100.0)
PLATELETS: 281 10*3/uL (ref 150–400)
RBC: 4.12 MIL/uL — AB (ref 4.22–5.81)
RDW: 14.3 % (ref 11.5–15.5)
WBC: 6.7 10*3/uL (ref 4.0–10.5)

## 2017-10-13 LAB — BASIC METABOLIC PANEL
ANION GAP: 8 (ref 5–15)
BUN: 17 mg/dL (ref 6–20)
CALCIUM: 9 mg/dL (ref 8.9–10.3)
CHLORIDE: 105 mmol/L (ref 101–111)
CO2: 25 mmol/L (ref 22–32)
CREATININE: 0.79 mg/dL (ref 0.61–1.24)
GFR calc non Af Amer: >60 mL/min (ref >60)
Glucose, Bld: 102 mg/dL — ABNORMAL HIGH (ref 65–99)
Potassium: 3.7 mmol/L (ref 3.5–5.1)
SODIUM: 138 mmol/L (ref 135–145)

## 2017-10-13 MED ORDER — METOPROLOL TARTRATE 12.5 MG HALF TABLET
12.5000 mg | ORAL_TABLET | Freq: Two times a day (BID) | ORAL | Status: DC
  Administered 2017-10-13 – 2017-10-16 (×6): 12.5 mg via ORAL
  Filled 2017-10-13 (×6): qty 1

## 2017-10-13 NOTE — Progress Notes (Signed)
Physical Therapy Session Note  Patient Details  Name: Jaime Pierce MRN: [id number] Date of Birth: Jun 11, 1946  Today's Date: 10/13/2017 PT Individual Time: 1300-1400 PT Individual Time Calculation (min): 60 min   Short Term Goals: Week 5:  PT Short Term Goal 1 (Week 5): =LTG due to estimated LOS  Skilled Therapeutic Interventions/Progress Updates: Pt received seated in w/c, denies pain and agreeable to treatment. Ambulatory transfer w/c <>bed with RW and min guard provided by wife on carpeted surface with direction changes to navigate around bed. Sit <>supine with S and increased time. Positioned in sidelying on each side, discussed protection of shoulder and demonstrated positioning, propping with pillows under RUE, between knees, behind back to increased comfort, pressure relief. Transfer w/c <>couch with wife providing min guard and RW for stand pivot transfer; several attempts required to stand from low couch with pt ultimately pulling from stabilized RW as he was sitting in the middle of the couch and did not have access to arm rests. Ambulatory transfer on carpet w/c <>standard chair with wife providing min guard; RW and cues for technique when turning and preparing to sit. Educated pt and wife in need for ongoing assessment to determine safest way to perform activities at home as pt progresses and based on environmental demands. Discussed with wife beginning to perform transfers with nurse tech present to supervise initially and faded to independence over the next three days to prepare for d/c home. Safety plan updated. Pt returned to room totalA by wife at end of session.      Therapy Documentation Precautions:  Precautions Precautions: Fall Precaution Comments: right sided weakness, right inattention Restrictions Weight Bearing Restrictions: No   See Function Navigator for Current Functional Status.   Therapy/Group: Individual Therapy  [name] 10/13/2017,  2:13 PM

## 2017-10-13 NOTE — Progress Notes (Signed)
Social Work Patient ID: Agnes Lawrence, male   DOB: 26-Jun-1946, 71 y.o.   MRN: [id number]   Met with pt and wife yesterday following team conference.  Both aware that team continues to plan for d/c on 11/24 and they are pleased with gains.  They both admit that they feel a little apprehensive about going home and decrease in therapy, however, agree with plan for OP tx to be optimal.   Arranging DME and f/u.  Continue to follow.  Taige Housman, LCSW

## 2017-10-13 NOTE — Progress Notes (Signed)
[facility] PHYSICAL MEDICINE & REHABILITATION     PROGRESS NOTE   Subjective/Complaints: Lying in bed. No new issues. Asked if metoprolol could be changed to home schedule of bid  ROS: pt denies nausea, vomiting, diarrhea, cough, shortness of breath or chest pain   Objective: Vital Signs: Blood pressure 134/70, pulse 65, temperature 97.6 F (36.4 C), temperature source Oral, resp. rate 20, height 6\' 1"  (1.854 m), weight 85 kg (187 lb 6.3 oz), SpO2 91 %. No results found. Recent Labs    10/13/17 0659  WBC 6.7  HGB 12.6*  HCT 38.6*  PLT 281   Recent Labs    10/13/17 0659  NA 138  K 3.7  CL 105  GLUCOSE 102*  BUN 17  CREATININE 0.79  CALCIUM 9.0   CBG (last 3)  No results for input(s): GLUCAP in the last 72 hours.  Wt Readings from Last 3 Encounters:  10/06/17 85 kg (187 lb 6.3 oz)  09/01/17 85.1 kg (187 lb 9.8 oz)  06/30/17 83 kg (183 lb)    Physical Exam:  Constitutional: He appears well-developed. NAD. HENT: Normocephalic. Atraumatic. Eyes: EOMare normal. No discharge. Cardiovascular: RRR without murmur. No JVD     Respiratory: normal effort GI: BS +, non-distended.  MSK: Mild edema right knee. No tenderness Neurological: He is alertand oriented.  Motor: LUE/LLE: 5/5 proximal to distal  RUE: 2/5 shoulder abduction, 3+/5 elbow flexion/extension, 2+/5 hand grip--apraxic mAS 1/4 right elbow flexor RLE: 3 to 3+/5 HF, 3/5 KE and ankle PF/DF.    Skin.warm and dry.  Psych: Normal mood and behavior.     Assessment/Plan: 1. 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 Physiatrist is providing close team supervision and 24 hour management of active medical problems listed below. Physiatrist and rehab team continue to assess barriers to discharge/monitor patient progress toward functional and medical  goals.  Function:  Bathing Bathing position   Position: Shower  Bathing parts Body parts bathed by patient: Right arm, Chest, Abdomen, Front perineal area, Right upper leg, Left upper leg, Left arm, Left lower leg Body parts bathed by helper: Buttocks, Right lower leg, Back  Bathing assist Assist Level: Touching or steadying assistance(Pt > 75%)      Upper Body Dressing/Undressing Upper body dressing   What is the patient wearing?: Pull over shirt/dress     Pull over shirt/dress - Perfomed by patient: Thread/unthread right sleeve, Thread/unthread left sleeve, Put head through opening, Pull shirt over trunk Pull over shirt/dress - Perfomed by helper: Thread/unthread right sleeve, Pull shirt over trunk        Upper body assist Assist Level: More than reasonable time      Lower Body Dressing/Undressing Lower body dressing   What is the patient wearing?: Pants, Socks, Shoes Underwear - Performed by patient: Thread/unthread right underwear leg, Thread/unthread left underwear leg, Pull underwear up/down Underwear - Performed by helper: Thread/unthread right underwear leg, Thread/unthread left underwear leg, Pull underwear up/down Pants- Performed by patient: Thread/unthread right pants leg, Pull pants up/down, Thread/unthread left pants leg, Fasten/unfasten pants Pants- Performed by helper: Thread/unthread right pants leg Non-skid slipper socks- Performed by patient: Don/doff right sock, Don/doff left sock Non-skid slipper socks- Performed by helper: Don/doff right sock, Don/doff left sock Socks - Performed by patient: Don/doff right sock, Don/doff left sock Socks - Performed by helper: Don/doff right sock, Don/doff left sock Shoes - Performed by  patient: Don/doff right shoe, Don/doff left shoe Shoes - Performed by helper: Don/doff right shoe          Lower body assist Assist for lower body dressing: (mod A)      Toileting Toileting Toileting activity did not occur:  Safety/medical concerns Toileting steps completed by patient: Adjust clothing prior to toileting, Adjust clothing after toileting Toileting steps completed by helper: Performs perineal hygiene Toileting Assistive Devices: Grab bar or rail  Toileting assist Assist level: (mod A)   Transfers Chair/bed transfer   Chair/bed transfer method: Stand pivot Chair/bed transfer assist level: Touching or steadying assistance (Pt > 75%) Chair/bed transfer assistive device: Armrests Mechanical lift: Stedy   Locomotion Ambulation Ambulation activity did not occur: Safety/medical concerns   Max distance: 130' Assist level: Touching or steadying assistance (Pt > 75%)   Wheelchair   Type: Manual Max wheelchair distance: 25 Assist Level: Moderate assistance (Pt 50 - 74%)  Cognition Comprehension Comprehension assist level: Follows complex conversation/direction with extra time/assistive device  Expression Expression assist level: Expresses complex ideas: With extra time/assistive device  Social Interaction Social Interaction assist level: Interacts appropriately with others - No medications needed.  Problem Solving Problem solving assist level: Solves basic 75 - 89% of the time/requires cueing 10 - 24% of the time  Memory Memory assist level: Recognizes or recalls 50 - 74% of the time/requires cueing 25 - 49% of the time   Medical Problem List and Plan: 1. 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 DVT Prophylaxis/Anticoagulation: SCDs. Monitor for any signs of DVT  -Dopplers negative for DVT 3. Pain Management/spasticity:   -Continue baclofen 20 mg 3 times daily with good results  -PRAFO RLE while in bed  -ROM with therapy, family 4. Mood: Provide emotional support 5. Neuropsych: This patient iscapable of making decisions on hisown behalf. 6. Skin/Wound Care: Routine skin care  7.  Fluids/Electrolytes/Nutrition:     -BMP reviewed and WNL 8.CAD with CABG.  9. Hypertension. Lopressor 25 mg daily ---changed to BID schedule he uses at home  Vitals:   10/12/17 1612 10/13/17 0551  BP: 109/79 134/70  Pulse: 79 65  Resp: 20 20  Temp: 98.7 F (37.1 C) 97.6 F (36.4 C)  SpO2: 93% 91%    Controlled on 11/18 10.COPD/tobacco abuse. Counseling.Continue inhalers  -off oxygen without issue 11. Hyponatremia: Resolved 12. Mild leukocytosis: Resolved 13. Right knee pain with edema  Xray with mild degenerative changes in the patellofemoral joint  Voltaren gel PRN  Improving 14. Cough   Productive, likely secondary to COPD   CXR negative for acute change    LOS (Days) 34 A FACE TO FACE EVALUATION WAS PERFORMED  Ranelle OysterSWARTZ,Markel Kurtenbach T, MD 10/13/2017 8:38 AM

## 2017-10-13 NOTE — Patient Care Conference (Signed)
Inpatient RehabilitationTeam Conference and Plan of Care Update Date: 10/12/2017   Time: 2:30 PM    Patient Name: Jaime Pierce      Medical Record Number: [id number]  Date of Birth: August 22, 1946 Sex: Male         Room/Bed: 4W08C/4W08C-01 Payor Info: Payor: [employer] MED[employer]ARE / Plan: [employer] MEDICARE HMO/PPO / Product Type: *No Product type* /    Admitting Diagnosis: Stroke  Admit Date/Time:  09/09/2017  6:52 PM Admission Comments: No comment available   Primary Diagnosis:  Intraparenchymal hemorrhage of brain (HCC) Principal Problem: Intraparenchymal hemorrhage of brain [facility](HCC)  Patient Active Problem List   Diagnosis Date Noted  . Spastic hemiparesis (HCC)   . Cough   . 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 Pain   . Hyponatremia   . Benign essential HTN   . Intraparenchymal hemorrhage of brain (HCC) 09/09/2017  . 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 Gait disturbance, post-stroke   . Aphasia as late effect of stroke   . SAH (subarachnoid hemorrhage) (HCC) 09/02/2017  . B12 deficiency 09/02/2017  . Well adult exam 06/16/2016  . Cigarette smoker 02/23/2016  . COPD GOLD II  12/24/2014  . CAD (coronary artery disease) 07/22/2011  . HTN (hypertension) 07/22/2011  . Dyslipidemia 07/22/2011  . Smoker 07/22/2011    Expected Discharge Date: Expected Discharge Date: 10/16/17  Team Members Present: Physician leading conference: Dr. Faith RogueZachary Swartz Social Worker Present: Amada JupiterLucy Geralene Afshar, LCSW Nurse Present: Other (comment)(Awndrea Troxler, RN) PT Present: Other (comment)(Cindy Sharee PimpleWynn, PT) OT Present: Callie FieldingKatie Pittman, OT SLP Present: Feliberto Gottronourtney Payne, SLP PPS Coordinator present : Tora DuckMarie Noel, RN, CRRN     Current Status/Progress Goal Weekly Team Focus  Medical   Showing gradual improvement in his right-sided weakness.  Right hand and legs a bit but showing progress as well.  Blood pressure better  controlled.  Spasticity improved with baclofen  Stabilized medically for discharge  Monitoring blood pressure and nutritional parameters, monitoring spasticity   Bowel/Bladder   cotninent of b/b  maintain continence      Swallow/Nutrition/ Hydration   cardiac diet with thin liquids  maintain adequate nutrition      ADL's   min A overall with self care, min A functional transfer, mod A toileting  mod A toileting, Supervision UB and grooming, min A all other goals  R NMR, functional transfers, self care retraining, cognition, pt/family education   Mobility   min guard bed mobility, min guard transfers, min/modA gait   bed mobility upgraded to S, minA transfers, home gait goal added minA short distances, controlled environment gait goal upgraded to minA  dynamic standing balance, gait training, begin hands on training with caregiver   Communication   Mod I-Supervision  Supervision  complex comprehension and verbal expression    Safety/Cognition/ Behavioral Observations  Min-Mod A  Min A   complex problem solving and recall    Pain   right leg pain and rates 5(10) on numerical scale. baclofen, voltaren gel and tylenol administered and effective  maintain comfort with pain management and repositioning      Skin   yeast rash has resolved only a slight scale is noted  maintain skin integrity       Rehab Goals Patient on target to meet rehab goals: Yes *See Care Plan and progress notes for long and short-term goals.     Barriers to Discharge  Current Status/Progress Possible Resolutions Date Resolved  Physician    Medical stability        Transition medical management to the outpatient setting      Nursing                  PT                    OT                  SLP                SW                Discharge Planning/Teaching Needs:  Will d/c home with wife who is here daily and will provide 24/7 assistance.  ongoing   Team Discussion:  Continues to make excellent  progress.  Min assist with functional transfers and ambulation. Hands on ed begun with wife today and she is doing well.  Ready for d/c end of week and recommend OP therapy f/u.  Revisions to Treatment Plan:  None    Continued Need for Acute Rehabilitation Level of Care: The patient requires daily medical management by a physician with specialized training in physical medicine and rehabilitation for the following conditions: Daily direction of a multidisciplinary physical rehabilitation program to ensure safe treatment while eliciting the highest outcome that is of practical value to the patient.: Yes Daily medical management of patient stability for increased activity during participation in an intensive rehabilitation regime.: Yes Daily analysis of laboratory values and/or radiology reports with any subsequent need for medication adjustment of medical intervention for : Neurological problems  Jaime Pierce 10/13/2017, 3:49 PM

## 2017-10-13 NOTE — Progress Notes (Signed)
Speech Language Pathology Daily Session Note  Patient Details  Name: Jaime Pierce MRN: [id number] Date of Birth: 03-09-1946  Today's Date: 10/13/2017 SLP Individual Time: 0923-1025 SLP Individual Time Calculation (min): 62 min  Short Term Goals: Week 5: SLP Short Term Goal 1 (Week 5): Patient will recall new, daily information with Min A multimodal cues.  SLP Short Term Goal 2 (Week 5): Patient will self-monitor and correct errors during functional tasks with supervision A verbal cues.  SLP Short Term Goal 3 (Week 5): Patient will follow multi step directions with 75% accuracy and supervision verbal cues.   Skilled Therapeutic Interventions: Skilled treatment session focused on cognitive goals. SLP facilitated session by providing Min A verbal cues for recall of functional information and to utilize compensatory memory strategies (e.g. WARM) to improve recall of functional information/list of items. Min to mod A verbal cues for pt to provide verbal reasoning re: problems/pros/cons that may occur around this time of year. Patient left upright in wheelchair with all needs within reach. Continue with current plan of care.   Function:  Eating Eating                 Cognition Comprehension Comprehension assist level: Understands complex 90% of the time/cues 10% of the time  Expression   Expression assist level: Expresses complex ideas: With extra time/assistive device  Social Interaction Social Interaction assist level: Interacts appropriately with others - No medications needed.  Problem Solving Problem solving assist level: Solves basic 75 - 89% of the time/requires cueing 10 - 24% of the time  Memory Memory assist level: Recognizes or recalls 50 - 74% of the time/requires cueing 25 - 49% of the time    Pain Pain Assessment Pain Assessment: No/denies pain Pain Score: 4  Pain Type: Chronic pain Pain Location: Leg Pain Orientation: Right Pain Intervention(s): Medication  (See eMAR)  Therapy/Group: Individual Therapy  Danyka Merlin A Alby Schwabe 10/13/2017, 10:48 AM

## 2017-10-13 NOTE — Progress Notes (Signed)
Occupational Therapy Session Note  Patient Details  Name: Jaime Pierce MRN: [id number] Date of Birth: 1946-04-12  Today's Date: 10/13/2017 OT Individual Time: 1045-1200 OT Individual Time Calculation (min): 75 min    Short Term Goals: Week 4:  OT Short Term Goal 1 (Week 4): STGs=LTGs due to ELOS  Skilled Therapeutic Interventions/Progress Updates:    Upon entering the room, pt seated in wheelchair awaiting therapist with no c/o pain this session. His wife, Jaime Pierce, is present for continued hands on training. Pt assisted to ADL room via wheelchair and total A for time management. OT problem solved with caregiver a simulated shower transfer with threshold and use of shower seat. Pt returned demonstrations for side stepping and also stepping backwards into shower. Pt performed better and safer with stepping backwards over threshold to sit onto shower seat and exiting by stepping forward. Caregiver demonstrated providing pt with min A for transfer x 2. Pt ambulating back to room 120' with RW and overall supervision with 1 LOB secondary to fatigue requiring min A. Pt taking seated rest break and returning to room via wheelchair. Call bell and all needed items within reach upon exiting the room.   Therapy Documentation Precautions:  Precautions Precautions: Fall Precaution Comments: right sided weakness, right inattention Restrictions Weight Bearing Restrictions: No General:   Vital Signs: Therapy Vitals Temp: 98.2 F (36.8 C) Temp Source: Oral Pulse Rate: 74 Resp: 19 BP: 110/64 Patient Position (if appropriate): Sitting Oxygen Therapy SpO2: 93 % O2 Device: Not Delivered Pain:   ADL:   Vision   Perception    Praxis   Exercises:   Other Treatments:    See Function Navigator for Current Functional Status.   Therapy/Group: Individual Therapy  Alen BleacherBradsher, Xzavier Swinger P 10/13/2017, 5:44 PM

## 2017-10-14 NOTE — Progress Notes (Signed)
[place] PHYSICAL MEDICINE & REHABILITATION     PROGRESS NOTE   Subjective/Complaints: No problems overnight.  Feeling well.  Excited about going home this weekend  ROS: pt denies nausea, vomiting, diarrhea, cough, shortness of breath or chest pain   Objective: Vital Signs: Blood pressure 128/64, pulse 67, temperature 98 F (36.7 C), temperature source Oral, resp. rate 18, height 6\' 1"  (1.854 m), weight 85 kg (187 lb 6.3 oz), SpO2 95 %. No results found. Recent Labs    10/13/17 0659  WBC 6.7  HGB 12.6*  HCT 38.6*  PLT 281   Recent Labs    10/13/17 0659  NA 138  K 3.7  CL 105  GLUCOSE 102*  BUN 17  CREATININE 0.79  CALCIUM 9.0   CBG (last 3)  No results for input(s): GLUCAP in the last 72 hours.  Wt Readings from Last 3 Encounters:  10/06/17 85 kg (187 lb 6.3 oz)  09/01/17 85.1 kg (187 lb 9.8 oz)  06/30/17 83 kg (183 lb)    Physical Exam:  Constitutional: He appears well-developed. NAD. HENT: Normocephalic. Atraumatic. Eyes: EOMare normal. No discharge. Cardiovascular: RRR without murmur. No JVD     Respiratory: normal effort GI: BS +, non-distended.  MSK: Mild edema right knee. No tenderness Neurological: He is alertand oriented.  Motor: LUE/LLE: 5/5 proximal to distal  RUE: 2/5 shoulder abduction, 3+/5 elbow flexion/extension, 2+/5 hand grip--apraxic mAS 1/4 right elbow flexor RLE: 3 to 3+/5 HF, 3/5 KE and ankle PF/DF.    Skin.warm and dry.  Psych: Normal mood and behavior.     Assessment/Plan: 1. 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 Physiatrist is providing close team supervision and 24 hour management of active medical problems listed below. Physiatrist and rehab team continue to assess barriers to discharge/monitor patient progress toward functional and medical goals.  Function:  Bathing Bathing  position   Position: Shower  Bathing parts Body parts bathed by patient: Right arm, Chest, Abdomen, Front perineal area, Right upper leg, Left upper leg, Left arm, Left lower leg Body parts bathed by helper: Buttocks, Right lower leg, Back  Bathing assist Assist Level: Touching or steadying assistance(Pt > 75%)      Upper Body Dressing/Undressing Upper body dressing   What is the patient wearing?: Pull over shirt/dress     Pull over shirt/dress - Perfomed by patient: Thread/unthread right sleeve, Thread/unthread left sleeve, Put head through opening, Pull shirt over trunk Pull over shirt/dress - Perfomed by helper: Thread/unthread right sleeve, Pull shirt over trunk        Upper body assist Assist Level: More than reasonable time      Lower Body Dressing/Undressing Lower body dressing   What is the patient wearing?: Pants, Socks, Shoes Underwear - Performed by patient: Thread/unthread right underwear leg, Thread/unthread left underwear leg, Pull underwear up/down Underwear - Performed by helper: Thread/unthread right underwear leg, Thread/unthread left underwear leg, Pull underwear up/down Pants- Performed by patient: Thread/unthread right pants leg, Pull pants up/down, Thread/unthread left pants leg, Fasten/unfasten pants Pants- Performed by helper: Thread/unthread right pants leg Non-skid slipper socks- Performed by patient: Don/doff right sock, Don/doff left sock Non-skid slipper socks- Performed by helper: Don/doff right sock, Don/doff left sock Socks - Performed by patient: Don/doff right sock, Don/doff left sock Socks - Performed by helper: Don/doff right sock, Don/doff left sock Shoes - Performed by patient: Don/doff right shoe,  Don/doff left shoe Shoes - Performed by helper: Don/doff right shoe          Lower body assist Assist for lower body dressing: (mod A)      Toileting Toileting Toileting activity did not occur: Safety/medical concerns Toileting steps completed  by patient: Adjust clothing prior to toileting, Adjust clothing after toileting Toileting steps completed by helper: Performs perineal hygiene Toileting Assistive Devices: Grab bar or rail  Toileting assist Assist level: (mod A)   Transfers Chair/bed transfer   Chair/bed transfer method: Stand pivot Chair/bed transfer assist level: Touching or steadying assistance (Pt > 75%) Chair/bed transfer assistive device: Bedrails, Armrests Mechanical lift: Stedy   Locomotion Ambulation Ambulation activity did not occur: Safety/medical concerns   Max distance: 130' Assist level: Touching or steadying assistance (Pt > 75%)   Wheelchair   Type: Manual Max wheelchair distance: 25 Assist Level: Moderate assistance (Pt 50 - 74%)  Cognition Comprehension Comprehension assist level: Follows complex conversation/direction with extra time/assistive device  Expression Expression assist level: Expresses complex ideas: With extra time/assistive device  Social Interaction Social Interaction assist level: Interacts appropriately with others - No medications needed.  Problem Solving Problem solving assist level: Solves basic 75 - 89% of the time/requires cueing 10 - 24% of the time  Memory Memory assist level: Recognizes or recalls 50 - 74% of the time/requires cueing 25 - 49% of the time   Medical Problem List and Plan: 1. 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 ELOS 11/24 2. DVT Prophylaxis/Anticoagulation: SCDs. Monitor for any signs of DVT  -Dopplers negative for DVT 3. Pain Management/spasticity:   -Continue baclofen 20 mg 3 times daily with good results  -PRAFO RLE while in bed  -ROM with therapy, family 4. Mood: Provide emotional support 5. Neuropsych: This patient iscapable of making decisions on hisown behalf. 6. Skin/Wound Care: Routine skin care  7. Fluids/Electrolytes/Nutrition:      -BMP  WNL 8.CAD with CABG.  9. Hypertension. Lopressor 25 mg daily ---changed to BID schedule he uses at home  Vitals:   10/13/17 2014 10/14/17 0524  BP:  128/64  Pulse:  67  Resp:  18  Temp:  98 F (36.7 C)  SpO2: 93% 95%    Controlled  10.COPD/tobacco abuse. Counseling.Continue inhalers  -off oxygen without issue 11. Hyponatremia: Resolved 12. Mild leukocytosis: Resolved 13. Right knee pain with edema  Xray with mild degenerative changes in the patellofemoral joint  Voltaren gel PRN  Improving 14. Cough   resolved    LOS (Days) 35 A FACE TO FACE EVALUATION WAS PERFORMED  Ranelle OysterSWARTZ,Annalysa Mohammad T, MD 10/14/2017 8:42 AM

## 2017-10-14 NOTE — Progress Notes (Signed)
Pt resting in bed quietly easily aroused. Denies any pain at this time and states that tylenol prior to bed assists in chronic discomfort noted. Able to make needs known. Looking forward to discharge. New order for lopressor 12.5 mg po administered per order and pt / wife discussed that this dose is what he took prior to hospitalization. Safety maintained. callbell within reach. Will continue to monitor.

## 2017-10-15 ENCOUNTER — Inpatient Hospital Stay (HOSPITAL_COMMUNITY): Payer: Medicare HMO | Admitting: Physical Therapy

## 2017-10-15 ENCOUNTER — Inpatient Hospital Stay (HOSPITAL_COMMUNITY): Payer: Medicare HMO | Admitting: Occupational Therapy

## 2017-10-15 ENCOUNTER — Inpatient Hospital Stay (HOSPITAL_COMMUNITY): Payer: Medicare HMO | Admitting: Speech Pathology

## 2017-10-15 DIAGNOSIS — J449 Chronic obstructive pulmonary disease, unspecified: Secondary | ICD-10-CM | POA: Diagnosis not present

## 2017-10-15 DIAGNOSIS — I69151 Hemiplegia and hemiparesis following nontraumatic intracerebral hemorrhage affecting right dominant side: Secondary | ICD-10-CM | POA: Diagnosis not present

## 2017-10-15 MED ORDER — PANTOPRAZOLE SODIUM 40 MG PO TBEC: 40.0000 mg | DELAYED_RELEASE_TABLET | Freq: Every day | ORAL | 0 refills | Status: DC

## 2017-10-15 MED ORDER — HYDROCORTISONE 1 % EX OINT: TOPICAL_OINTMENT | Freq: Three times a day (TID) | CUTANEOUS | 0 refills | Status: DC | PRN

## 2017-10-15 MED ORDER — NYSTATIN 100000 UNIT/GM EX POWD: Freq: Three times a day (TID) | CUTANEOUS | 0 refills | Status: DC

## 2017-10-15 MED ORDER — METOPROLOL TARTRATE 25 MG PO TABS: 12.5000 mg | ORAL_TABLET | Freq: Two times a day (BID) | ORAL | 0 refills | Status: DC

## 2017-10-15 MED ORDER — BACLOFEN 20 MG PO TABS: 20.0000 mg | ORAL_TABLET | Freq: Three times a day (TID) | ORAL | 0 refills | Status: DC

## 2017-10-15 MED ORDER — DICLOFENAC SODIUM 1 % TD GEL: 2.0000 g | Freq: Four times a day (QID) | TRANSDERMAL | 0 refills | Status: DC | PRN

## 2017-10-15 NOTE — Progress Notes (Addendum)
Social Work  Discharge Note  The overall goal for the admission was met for:   Discharge location: Yes - home with wife able to provide 24/7 assistance  Length of Stay: Yes - 37 days (with discharge on 10/16/17)  Discharge activity level: Yes - supervision to min assist  Home/community participation: Yes  Services provided included: MD, RD, PT, OT, SLP, RN, TR, Pharmacy, [place]: [employer]  Follow-up services arranged: Outpatient: PT, OT, ST via [facility], DME: 18x18 lightweight w/c with right 1/2 lap tray, cushion, rolling walker, 3n1 commode, tub seat via [place] and Patient/Family has no preference for HH/DME agencies  Comments (or additional information):  Patient/Family verbalized understanding of follow-up arrangements: Yes  Individual responsible for coordination of the follow-up plan: pt  Confirmed correct DME delivered: Heron Pitcock 10/15/2017    [place], [place]

## 2017-10-15 NOTE — Discharge Instructions (Signed)
Inpatient Rehab Discharge Instructions  Jaime FuelRichard A Pierce Discharge date and time: No discharge date for patient encounter.10/15/17   Activities/Precautions/ Functional Status: Activity: activity as tolerated Diet: regular diet Wound Care: none needed   Functional status:  ___ No restrictions     ___ Walk up steps independently _X__ 24/7 supervision/assistance   ___ Walk up steps with assistance ___ Intermittent supervision/assistance  ___ Bathe/dress independently ___ Walk with walker     _X__ Bathe/dress with assistance ___ Walk Independently    ___ Shower independently ___ Walk with assistance    ___ Shower with assistance _X__ No alcohol     ___ Return to work/school ________    COMMUNITY REFERRALS UPON DISCHARGE:    Outpatient: PT    OT    ST                   Agency:  [facility]      Phone:  [phone]                Appointment Date/Time: 11/27 @ 9:30 am (please arrive by 9:15am) - PT and OT visits                                                                                                                                    12/7 @ 2:45 pm - speech   Medical Equipment/Items Ordered:  Wheelchair, walker, commode, tub seat                                                       Agency/Supplier:  [facility] @ [phone]    GENERAL COMMUNITY RESOURCES FOR PATIENT/FAMILY:  Support Groups:    Stroke Support Group                                2nd Thursday of each month                                3 - 4:00 pm in the dayroom of the [facility]patient Rehab Unit at [facility]                                Contact:  Estill DoomsCaitlin Warren @ [phone]     Special Instructions: No driving or smoking   STROKE/TIA DISCHARGE INSTRUCTIONS SMOKING Cigarette smoking nearly doubles your risk of having a stroke & is the single most alterable risk factor  If you smoke or have smoked in the last 12 months, you are advised to quit smoking for your health.  Most of the  excess  cardiovascular risk related to smoking disappears within a year of stopping.  Ask you doctor about anti-smoking medications  [place] Quit Line: 1-800-QUIT NOW  Free Smoking Cessation Classes [phone]  CHOLESTEROL Know your levels; limit fat & cholesterol in your diet  Lipid Panel     Component Value Date/Time   CHOL 131 09/02/2017 0314   TRIG 73 09/02/2017 0314   TRIG 65 09/21/2006 0832   HDL 35 (L) 09/02/2017 0314   CHOLHDL 3.7 09/02/2017 0314   VLDL 15 09/02/2017 0314   LDLCALC 81 09/02/2017 0314      Many patients benefit from treatment even if their cholesterol is at goal.  Goal: Total Cholesterol (CHOL) less than 160  Goal:  Triglycerides (TRIG) less than 150  Goal:  HDL greater than 40  Goal:  LDL (LDLCALC) less than 100   BLOOD PRESSURE American Stroke Association blood pressure target is less that 120/80 mm/Hg  Your discharge blood pressure is:  BP: (!) 150/72  Monitor your blood pressure  Limit your salt and alcohol intake  Many individuals will require more than one medication for high blood pressure  DIABETES (A1c is a blood sugar average for last 3 months) Goal HGBA1c is under 7% (HBGA1c is blood sugar average for last 3 months)  Diabetes: No known diagnosis of diabetes    Lab Results  Component Value Date   HGBA1C 5.9 (H) 09/02/2017     Your HGBA1c can be lowered with medications, healthy diet, and exercise.  Check your blood sugar as directed by your physician  Call your physician if you experience unexplained or low blood sugars.  PHYSICAL ACTIVITY/REHABILITATION Goal is 30 minutes at least 4 days per week  Activity: Increase activity slowly, and No driving, Therapies: Physical Therapy: Home Health Return to work:   Activity decreases your risk of heart attack and stroke and makes your heart stronger.  It helps control your weight and blood pressure; helps you relax and can improve your mood.  Participate in a regular exercise  program.  Talk with your doctor about the best form of exercise for you (dancing, walking, swimming, cycling).  DIET/WEIGHT Goal is to maintain a healthy weight  Your discharge diet is: Diet Heart Room service appropriate? Yes; Fluid consistency: Thin  liquids Your height is:  Height:  (185.4 cm) Your current weight is: Weight: 85 kg (187 lb 6.3 oz) Your Body Mass Index (BMI) is:  BMI (Calculated): 24.73  Following the type of diet specifically designed for you will help prevent another stroke.  You are at goal weight.     Your goal Body Mass Index (BMI) is 19-24.  Healthy food habits can help reduce 3 risk factors for stroke:  High cholesterol, hypertension, and excess weight.  RESOURCES Stroke/Support Group:  Call [phone]   STROKE EDUCATION PROVIDED/REVIEWED AND GIVEN TO PATIENT Stroke warning signs and symptoms How to activate emergency medical system (call 911). Medications prescribed at discharge. Need for follow-up after discharge. Personal risk factors for stroke. Pneumonia vaccine given:  Flu vaccine given:  My questions have been answered, the writing is legible, and I understand these instructions.  I will adhere to these goals & educational materials that have been provided to me after my discharge from the hospital.      My questions have been answered and I understand these instructions. I will adhere to these goals and the provided educational materials after my discharge from the hospital.  Patient/Caregiver Signature _______________________________ Date __________  Clinician  Signature _______________________________________ Date __________  Please bring this form and your medication list with you to all your follow-up doctor's appointments.

## 2017-10-15 NOTE — Discharge Summary (Signed)
Occupational Therapy Discharge Summary  Patient Details  Name: Jaime Pierce MRN: [id number] Date of Birth: [birth year]  Today's Date: 10/15/2017 OT Individual Time: 7209-4709 OT Individual Time Calculation (min): 69 min   Patient has met 13 of 13 long term goals due to improved activity tolerance, improved balance, postural control, ability to compensate for deficits, functional use of  RIGHT upper extremity, improved attention, improved awareness and improved coordination.  Patient to discharge at [facility] Assist level.  Patient's care partner is independent to provide the necessary physical and cognitive assistance at discharge.    All goals met.   Recommendation:  Patient will benefit from ongoing skilled OT services in outpatient setting to continue to advance functional skills in the area of BADL.  Equipment: shower seat + elongated BSC  Reasons for discharge: treatment goals met  Patient/family agrees with progress made and goals achieved: Yes   Skilled Therapeutic Intervention:  Tx focus on OT reevaluation, pt/family education, and functional transfers.   Pt greeted in w/c with spouse Butch Penny present. Declining shower, but agreeable to complete shower transfers per simulated home environment. Butch Penny was provided with hands on practice while pt completed transfer with Min A at ambulatory level, backing up over walk in shower threshold of 6 inches with RW. Discussed bathroom modifications to implement to maximize safety. Pt ambulated to elevated toilet, able to complete clothing mgt x2 while spouse provided steady assist. Answered safety questions spouse had in regards to hygiene completion and cues to provide him to maintain upright standing posture while she assisted him with this. Pt then ambulated to w/c in room. Pt able to doff/don footwear with supervision and extra time using hemi techniques. Pt able to achieve figure 4 bilateral LEs now! Steady assist (from spouse) for LB  dressing at sit<stand level, supervision donning overhead shirt. Throughout session, spouse active in providing appropriate multimodal cues to enhance safety while maximizing pts functional independence. Discussed functional activities and AAROM exercises to complete at home for further NMR. Answered pertinent d/c questions from pt/spouse. At end of session pt was left in w/c with all needs within reach.   OT Discharge Precautions/Restrictions  Precautions Precautions: Fall Precaution Comments: right sided weakness, right inattention Restrictions Weight Bearing Restrictions: No Pain No c/o pain during tx    ADL ADL ADL Comments: Please see functional navigator for ADL status Vision Baseline Vision/History: Wears glasses Wears Glasses: Reading only Patient Visual Report: No change from baseline Vision Assessment?: Yes(pt able to read labels on ADL items, clock, and newspaper. Rt inattention still present, however improved since time of eval) Perception  Perception: Impaired Inattention/Neglect: Does not attend to right visual field;Does not attend to right side of body(Mild Rt inattention ) Praxis Praxis: Impaired Praxis Impairment Details: Motor planning;Ideomotor Cognition Overall Cognitive Status: Impaired/Different from baseline Arousal/Alertness: Awake/alert Orientation Level: Oriented X4 Attention: Sustained Sustained Attention: Appears intact Memory: Impaired Awareness: Impaired Safety/Judgment: Appears intact Sensation Sensation Light Touch: Impaired Detail Light Touch Impaired Details: Impaired RUE;Impaired RLE Stereognosis: Not tested Hot/Cold: Appears Intact(bilateral UEs) Proprioception: Impaired Detail Proprioception Impaired Details: Impaired RUE;Impaired RLE Coordination Gross Motor Movements are Fluid and Coordinated: No Fine Motor Movements are Fluid and Coordinated: No Coordination and Movement Description: affected by Rt hemiparesis  Motor   Motor Motor: Hemiplegia;Abnormal tone;Abnormal postural alignment and control Motor - Discharge Observations: Affected by motor planning deficits, Rt hemiparesis and abnormal tone  Mobility  Transfers Transfers: Sit to Stand;Stand to Sit Sit to Stand: From toilet;4: Min assist Stand to Sit:  To toilet;4: Min assist  Trunk/Postural Assessment  Cervical Assessment Cervical Assessment: Within Functional Limits Thoracic Assessment Thoracic Assessment: Exceptions to [facility] Lumbar Assessment Lumbar Assessment: Exceptions to WFL(posterior pelvic tilt) Postural Control Postural Control: Deficits on evaluation  Balance Balance Balance Assessed: Yes Dynamic Sitting Balance Dynamic Sitting - Balance Support: (LB bathing/dressing) Dynamic Sitting - Level of Assistance: 5: Stand by assistance Dynamic Standing Balance Dynamic Standing - Balance Support: No upper extremity supported Dynamic Standing - Level of Assistance: 4: Min assist Dynamic Standing - Balance Activities: Lateral lean/weight shifting;Forward lean/weight shifting;Reaching for objects Dynamic Standing - Comments: during LB self care Extremity/Trunk Assessment RUE Assessment RUE Assessment: (Brunnstrom Stage 4, pt with developing gross grasp and able to elevate limb to mouth during self feeding) LUE Assessment LUE Assessment: Within Functional Limits   See Function Navigator for Current Functional Status.  Nance Mccombs A Tieshia Rettinger 10/15/2017, 8:01 PM

## 2017-10-15 NOTE — Plan of Care (Signed)
  Progressing Consults RH STROKE PATIENT EDUCATION Description See Patient Education module for education specifics   10/15/2017 1641 - Progressing by Melina ModenaBurchett, Jaizon Deroos, RN RH BOWEL ELIMINATION RH STG MANAGE BOWEL WITH ASSISTANCE Description STG Manage Bowel with min Assistance.   10/15/2017 1641 - Progressing by Melina ModenaBurchett, Etoile Looman, RN RH STG MANAGE BOWEL W/MEDICATION W/ASSISTANCE Description STG Manage Bowel with Medication with min Assistance.   10/15/2017 1641 - Progressing by Melina ModenaBurchett, Vyncent Overby, RN RH BLADDER ELIMINATION RH STG MANAGE BLADDER WITH ASSISTANCE Description STG Manage Bladder With min Assistance   10/15/2017 1641 - Progressing by Melina ModenaBurchett, Ximenna Fonseca, RN RH SKIN INTEGRITY RH STG SKIN FREE OF INFECTION/BREAKDOWN Description Prevent skin breakdown or infection with mod assist  10/15/2017 1641 - Progressing by Melina ModenaBurchett, Othmar Ringer, RN RH SAFETY RH STG ADHERE TO SAFETY PRECAUTIONS W/ASSISTANCE/DEVICE Description STG Adhere to Safety Precautions With mod I Assistance/Device.   10/15/2017 1641 - Progressing by Melina ModenaBurchett, Audrielle Vankuren, RN RH PAIN MANAGEMENT RH STG PAIN MANAGED AT OR BELOW PT'S PAIN GOAL Description Less than 3 out of 10  10/15/2017 1641 - Progressing by Melina ModenaBurchett, Saheed Carrington, RN

## 2017-10-15 NOTE — Plan of Care (Signed)
Pt continent of bladder Tylenol with relief

## 2017-10-15 NOTE — Progress Notes (Addendum)
Pt resting in bed quietly. Easily aroused. Chronic pain noted with pain management administered and effective. Able to make needs known. Safety maintained. callbell within reach. Will continue to monitor. Wife at bedside and very involved within his care.

## 2017-10-15 NOTE — Progress Notes (Signed)
Speech Language Pathology Session Note & Discharge Summary  Patient Details  Name: Jaime Pierce MRN: [id number] Date of Birth: [birth year]  Today's Date: 10/15/2017 SLP Individual Time: 0930-1030 SLP Individual Time Calculation (min): 60 min   Skilled Therapeutic Interventions: Skilled treatment session focused on cognitive-linguistic goals and completion of patient and family education.  Patient was Mod I for following 2 and 3 step commands and 2 step commands with up to 9 manipulatives. Patient and his wife re-educated on patient's current cognitive function and strategies to utilize at home to maximize recall and overall safety. All verbalized understanding and agreement of information. Patient left upright in wheelchair with family present.   Patient has met 7 of 7 long term goals.  Patient to discharge at overall Supervision;Min level.   Reasons goals not met: N/A   Clinical Impression/Discharge Summary: Patient has made excellent gains and has met 7 of 7 LTG's this admission. Currently, patient is overall Mod I for auditory comprehension and verbal expression of complex information with excellent gains in motor planning as well. Patient continues to demonstrate minimal impairments in selective attention, recall with use of strategies and complex problem solving. Patient and family education is complete and patient will discharge home with 24 hour supervision from family. Patient would benefit from f/u SLP services to maximize his cognitive function and overall functional independence prior to discharge.   Care Partner:  Caregiver Able to Provide Assistance: Yes  Type of Caregiver Assistance: Physical;Cognitive  Recommendation:  Outpatient SLP;24 hour supervision/assistance  Rationale for SLP Follow Up: Reduce caregiver burden;Maximize cognitive function and independence   Equipment: N/A   Reasons for discharge: Treatment goals met   Patient/Family Agrees with Progress Made  and Goals Achieved: Yes   Function:   Cognition Comprehension Comprehension assist level: Follows complex conversation/direction with extra time/assistive device  Expression   Expression assist level: Expresses complex ideas: With extra time/assistive device  Social Interaction Social Interaction assist level: Interacts appropriately with others - No medications needed.  Problem Solving Problem solving assist level: Solves basic 75 - 89% of the time/requires cueing 10 - 24% of the time  Memory Memory assist level: Recognizes or recalls 75 - 89% of the time/requires cueing 10 - 24% of the time   Jaime Pierce 10/15/2017, 12:56 PM

## 2017-10-15 NOTE — Plan of Care (Signed)
All LTGs achieved 10/15/17 

## 2017-10-15 NOTE — Progress Notes (Signed)
[place] PHYSICAL MEDICINE & REHABILITATION     PROGRESS NOTE   Subjective/Complaints: Upper edge of bed brushing his teeth.  No new complaints.  ROS: pt denies nausea, vomiting, diarrhea, cough, shortness of breath or chest pain   Objective: Vital Signs: Blood pressure (!) 150/72, pulse 64, temperature (!) 97.5 F (36.4 C), temperature source Oral, resp. rate 18, height 6\' 1"  (1.854 m), weight 85 kg (187 lb 6.3 oz), SpO2 95 %. No results found. Recent Labs    10/13/17 0659  WBC 6.7  HGB 12.6*  HCT 38.6*  PLT 281   Recent Labs    10/13/17 0659  NA 138  K 3.7  CL 105  GLUCOSE 102*  BUN 17  CREATININE 0.79  CALCIUM 9.0   CBG (last 3)  No results for input(s): GLUCAP in the last 72 hours.  Wt Readings from Last 3 Encounters:  10/06/17 85 kg (187 lb 6.3 oz)  09/01/17 85.1 kg (187 lb 9.8 oz)  06/30/17 83 kg (183 lb)    Physical Exam:  Constitutional: He appears well-developed. NAD. HENT: Normocephalic. Atraumatic. Eyes: EOMare normal. No discharge. Cardiovascular: RRR without murmur. No JVD      Respiratory: normal effort GI: BS +, non-distended.  MSK: Mild edema right knee. No tenderness Neurological: He is alertand oriented.  Motor: LUE/LLE: 5/5 proximal to distal  RUE: 2/5 shoulder abduction, 3+/5 elbow flexion/extension, 2+/5 hand grip--apraxic mAS tr to 1/4 right elbow flexor RLE: 3 to 3+/5 HF, 3/5 KE and ankle PF/DF.    Skin.warm and dry.  Psych: Normal mood and behavior.     Assessment/Plan: 1. 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 Physiatrist is providing close team supervision and 24 hour management of active medical problems listed below. Physiatrist and rehab team continue to assess barriers to discharge/monitor patient progress toward functional and medical goals.  Function:  Bathing Bathing  position   Position: Shower  Bathing parts Body parts bathed by patient: Right arm, Chest, Abdomen, Front perineal area, Right upper leg, Left upper leg, Left arm, Left lower leg Body parts bathed by helper: Buttocks, Right lower leg, Back  Bathing assist Assist Level: Touching or steadying assistance(Pt > 75%)      Upper Body Dressing/Undressing Upper body dressing   What is the patient wearing?: Pull over shirt/dress     Pull over shirt/dress - Perfomed by patient: Thread/unthread right sleeve, Thread/unthread left sleeve, Put head through opening, Pull shirt over trunk Pull over shirt/dress - Perfomed by helper: Thread/unthread right sleeve, Pull shirt over trunk        Upper body assist Assist Level: More than reasonable time      Lower Body Dressing/Undressing Lower body dressing   What is the patient wearing?: Pants, Socks, Shoes Underwear - Performed by patient: Thread/unthread right underwear leg, Thread/unthread left underwear leg, Pull underwear up/down Underwear - Performed by helper: Thread/unthread right underwear leg, Thread/unthread left underwear leg, Pull underwear up/down Pants- Performed by patient: Thread/unthread right pants leg, Pull pants up/down, Thread/unthread left pants leg, Fasten/unfasten pants Pants- Performed by helper: Thread/unthread right pants leg Non-skid slipper socks- Performed by patient: Don/doff right sock, Don/doff left sock Non-skid slipper socks- Performed by helper: Don/doff right sock, Don/doff left sock Socks - Performed by patient: Don/doff right sock, Don/doff left sock Socks - Performed by helper: Don/doff right sock, Don/doff left sock Shoes - Performed by patient:  Don/doff right shoe, Don/doff left shoe Shoes - Performed by helper: Don/doff right shoe          Lower body assist Assist for lower body dressing: (mod A)      Toileting Toileting Toileting activity did not occur: Safety/medical concerns Toileting steps completed  by patient: Adjust clothing prior to toileting, Adjust clothing after toileting Toileting steps completed by helper: Performs perineal hygiene Toileting Assistive Devices: Grab bar or rail  Toileting assist Assist level: (mod A)   Transfers Chair/bed transfer   Chair/bed transfer method: Stand pivot Chair/bed transfer assist level: Touching or steadying assistance (Pt > 75%) Chair/bed transfer assistive device: Bedrails, Armrests Mechanical lift: Stedy   Locomotion Ambulation Ambulation activity did not occur: Safety/medical concerns   Max distance: 130' Assist level: Touching or steadying assistance (Pt > 75%)   Wheelchair   Type: Manual Max wheelchair distance: 25 Assist Level: Moderate assistance (Pt 50 - 74%)  Cognition Comprehension Comprehension assist level: Follows complex conversation/direction with extra time/assistive device  Expression Expression assist level: Expresses complex ideas: With extra time/assistive device  Social Interaction Social Interaction assist level: Interacts appropriately with others - No medications needed.  Problem Solving Problem solving assist level: Solves basic 75 - 89% of the time/requires cueing 10 - 24% of the time  Memory Memory assist level: Recognizes or recalls 50 - 74% of the time/requires cueing 25 - 49% of the time   Medical Problem List and Plan: 1. 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 ELOS 11/24 2. DVT Prophylaxis/Anticoagulation: SCDs. Monitor for any signs of DVT  -Dopplers negative for DVT 3. Pain Management/spasticity:   -Continue baclofen 20 mg 3 times daily with good results  -PRAFO RLE while in bed  -ROM with therapy, family 4. Mood: Provide emotional support 5. Neuropsych: This patient iscapable of making decisions on hisown behalf. 6. Skin/Wound Care: Routine skin care  7. Fluids/Electrolytes/Nutrition:      -BMP  WNL 8.CAD with CABG.  9. Hypertension. Lopressor   ---changed to 12.5mg  BID schedule he uses at home  Vitals:   10/15/17 0700 10/15/17 0900  BP: (!) 150/72   Pulse:    Resp:    Temp:    SpO2:  95%    Controlled for the most part 10.COPD/tobacco abuse. Counseling.Continue inhalers  -off oxygen without issue 11. Hyponatremia: Resolved 12. Mild leukocytosis: Resolved 13. Right knee pain with edema  Xray with mild degenerative changes in the patellofemoral joint  Voltaren gel PRN--- he would like a prescription for this at home  Improving 14. Cough   resolved    LOS (Days) 36 A FACE TO FACE EVALUATION WAS PERFORMED  Ranelle OysterSWARTZ,Siana Panameno T, MD 10/15/2017 10:13 AM

## 2017-10-15 NOTE — Progress Notes (Addendum)
Physical Therapy Discharge Summary  Patient Details  Name: Jaime Pierce MRN: [id number] Date of Birth: [birth year]  Today's Date: 10/15/2017 PT Individual Time: 1300-1415 PT Individual Time Calculation (min): 75 min    Patient has met 10 of 10 long term goals due to improved activity tolerance, improved balance, improved postural control, increased strength, ability to compensate for deficits, functional use of  right upper extremity and right lower extremity, improved attention, improved awareness and improved coordination.  Patient to discharge at an ambulatory level [place].   Patient's care partner is independent to provide the necessary physical and cognitive assistance at discharge.  Reasons goals not met: All goals met  Recommendation:  Patient will benefit from ongoing skilled PT services in outpatient setting to continue to advance safe functional mobility, address ongoing impairments in strength, balance, coordination, postural control, midline orientation, and minimize fall risk.  Equipment: w/c, RW  Reasons for discharge: treatment goals met and discharge from hospital  Patient/family agrees with progress made and goals achieved: Yes  PT Discharge Precautions/Restrictions Precautions Precautions: Fall Precaution Comments: right sided weakness, right inattention Restrictions Weight Bearing Restrictions: No Pain Pain Assessment Pain Assessment: No/denies pain Vision/Perception  Praxis Praxis: Impaired Praxis Impairment Details: Motor planning;Ideomotor  Cognition Overall Cognitive Status: Impaired/Different from baseline Arousal/Alertness: Awake/alert Attention: Selective Sustained Attention: Impaired Selective Attention: Impaired Selective Attention Impairment: Verbal basic;Functional basic Awareness: Impaired Awareness Impairment: Emergent impairment Safety/Judgment: Appears intact Sensation Sensation Light Touch: Impaired Detail Light Touch  Impaired Details: Impaired RUE;Impaired RLE Stereognosis: Not tested Hot/Cold: Not tested Proprioception: Impaired Detail Proprioception Impaired Details: Impaired RUE;Impaired RLE Coordination Gross Motor Movements are Fluid and Coordinated: No Coordination and Movement Description: incoordination d/t increased tone, apraxia Heel Shin Test: impaired RLE Motor  Motor Motor: Hemiplegia;Abnormal tone;Abnormal postural alignment and control Motor - Discharge Observations: L trunk shortening/R trunk lengthening, R hemiparesis and hypertonicity  Mobility Bed Mobility Bed Mobility: Sit to Supine;Supine to Sit Supine to Sit: 5: Supervision Supine to Sit Details: Verbal cues for precautions/safety;Verbal cues for technique Sit to Supine: 5: Supervision Sit to Supine - Details: Verbal cues for technique;Verbal cues for precautions/safety Transfers Transfers: Yes Sit to Stand: 4: Min guard;5: Supervision Sit to Stand Details: Verbal cues for technique;Verbal cues for precautions/safety;Verbal cues for safe use of DME/AE Sit to Stand Details (indicate cue type and reason): cues for hand placement on w/c min verbal/tactile cues for upright posture to complete stand Stand to Sit: 5: Supervision;With armrests Stand to Sit Details: cues for reaching back to chair, aiming hips towards hand to prevent pushing Stand Pivot Transfers: 4: Min guard;5: Supervision;With armrests Stand Pivot Transfer Details: Verbal cues for technique;Verbal cues for precautions/safety;Verbal cues for safe use of DME/AE;Tactile cues for placement Stand Pivot Transfer Details (indicate cue type and reason): S with RW, min guard no AD Locomotion  Ambulation Ambulation: Yes Ambulation/Gait Assistance: 5: Supervision;4: Min guard Ambulation Distance (Feet): 150 Feet Assistive device: Rolling walker;Other (Comment)(R hand splint) Ambulation/Gait Assistance Details: Verbal cues for technique;Verbal cues for  precautions/safety;Tactile cues for posture Gait Gait: Yes Gait Pattern: Impaired Gait Pattern: Poor foot clearance - right;Right flexed knee in stance;Lateral hip instability;Decreased dorsiflexion - right;Decreased stride length;Step-through pattern;Decreased stance time - right;Right foot flat;Scissoring;Narrow base of support Gait velocity: 0.32 ft/sec Stairs / Additional Locomotion Stairs: Yes Stairs Assistance: 4: Min guard Stair Management Technique: Step to pattern;Forwards;Sideways(forward ascent, sideways descent) Number of Stairs: 4 Height of Stairs: 6 Ramp: 5: Supervision;4: Min Chemical engineer: Yes Wheelchair Assistance: 5: Personnel officer  Assistance Details: Verbal cues for technique Wheelchair Propulsion: Left lower extremity;Left upper extremity Wheelchair Parts Management: Needs assistance Distance: 150'  Trunk/Postural Assessment  Cervical Assessment Cervical Assessment: Within Functional Limits Thoracic Assessment Thoracic Assessment: Exceptions to WFL(L trunk shortening, R trunk lengthening) Lumbar Assessment Lumbar Assessment: Exceptions to WFL(posterior pelvic tilt, decreased lumbar lordosis) Postural Control Postural Control: Deficits on evaluation Righting Reactions: delayed righting reactions/stepping strategies  Balance Balance Balance Assessed: Yes Static Sitting Balance Static Sitting - Balance Support: No upper extremity supported;Feet supported Static Sitting - Level of Assistance: 6: Modified independent (Device/Increase time) Dynamic Sitting Balance Dynamic Sitting - Balance Support: No upper extremity supported;Feet supported Dynamic Sitting - Level of Assistance: 5: Stand by assistance Dynamic Sitting - Balance Activities: Lateral lean/weight shifting;Forward lean/weight shifting;Reaching for objects Static Standing Balance Static Standing - Balance Support: Bilateral upper extremity supported Static  Standing - Level of Assistance: 5: Stand by assistance Dynamic Standing Balance Dynamic Standing - Balance Support: Bilateral upper extremity supported Dynamic Standing - Level of Assistance: 5: Stand by assistance;4: Min assist Dynamic Standing - Balance Activities: Lateral lean/weight shifting;Forward lean/weight shifting;Reaching for objects Extremity Assessment  RUE Assessment RUE Assessment: Exceptions to WFL(hemiparesis, increased tone; gross grasp and 2/5 shoulder flexion/abduction) LUE Assessment LUE Assessment: Within Functional Limits RLE Assessment RLE Assessment: Exceptions to [facility] RLE Strength RLE Overall Strength Comments: grossly 4/5 throughout; demonstrates hip abduction strength impairments during functional tasks (standing, gait) RLE Tone RLE Tone Comments: hypertonicity LLE Assessment LLE Assessment: Within Functional Limits LLE Strength LLE Overall Strength Comments: grossly 4+/5 throughout  Skilled Therapeutic Intervention: pt received seated in w/c, denies pain and agreeable to treatment. Assessed all mobility as described above with wife providing min guard and cueing as necessary. Set DME at appropriate height/position and educated pt and wife in w/c management for storage/transport. Pt demonstrates significantly decreased gait speed of 0.32 ft/sec indicative of limited household ambulator and increased risk for falls; discussed risks with patient and wife. Pt and wife with no further questions or concerns at this time. Remained seated in w/c at end of session, all needs in reach.    See Function Navigator for Current Functional Status.  Benjiman Core Tygielski 10/15/2017, 2:24 PM

## 2017-10-16 NOTE — Progress Notes (Signed)
Jaime Pierce PHYSICAL MEDICINE & REHABILITATION     PROGRESS NOTE   Subjective/Complaints: Rested soundly.  No new complaints.  Feels ready for discharge today and is excited  ROS: pt denies nausea, vomiting, diarrhea, cough, shortness of breath or chest pain   Objective: Vital Signs: Blood pressure (!) 151/77, pulse 68, temperature (!) 97.4 F (36.3 C), temperature source Oral, resp. rate 18, height 6\' 1"  (1.854 m), weight 85 kg (187 lb 6.3 oz), SpO2 92 %. No results found. No results for input(s): WBC, HGB, HCT, PLT in the last 72 hours. No results for input(s): NA, K, CL, GLUCOSE, BUN, CREATININE, CALCIUM in the last 72 hours.  Invalid input(s): CO CBG (last 3)  No results for input(s): GLUCAP in the last 72 hours.  Wt Readings from Last 3 Encounters:  10/06/17 85 kg (187 lb 6.3 oz)  09/01/17 85.1 kg (187 lb 9.8 oz)  06/30/17 83 kg (183 lb)    Physical Exam:  Constitutional: He appears well-developed. NAD. HENT: Normocephalic. Atraumatic. Eyes: EOMare normal. No discharge. Cardiovascular: RRR without murmur. No JVD       Respiratory: normal effort GI: BS +, non-distended.  MSK: Mild edema right knee. No tenderness Neurological: He is alertand oriented.  Motor: LUE/LLE: 5/5 proximal to distal  RUE: 2/5 shoulder abduction, 3+/5 elbow flexion/extension, 2+/5 hand grip--apraxic mAS tr to 1/4 right elbow flexor RLE: 3 to 3+/5 HF, 3/5 KE and ankle PF/DF.    Skin.warm and dry.  Psych: Normal mood and behavior.     Assessment/Plan: 1. 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 Physiatrist is providing close team supervision and 24 hour management of active medical problems listed below. Physiatrist and rehab team continue to assess barriers to discharge/monitor patient progress toward functional and medical  goals.  Function:  Bathing Bathing position   Position: Shower(Simulated)  Bathing parts Body parts bathed by patient: Right arm, Chest, Abdomen, Front perineal area, Right upper leg, Left upper leg, Left arm, Left lower leg, Right lower leg Body parts bathed by helper: Back, Buttocks  Bathing assist Assist Level: Touching or steadying assistance(Pt > 75%)      Upper Body Dressing/Undressing Upper body dressing   What is the patient wearing?: Pull over shirt/dress     Pull over shirt/dress - Perfomed by patient: Thread/unthread right sleeve, Thread/unthread left sleeve, Put head through opening, Pull shirt over trunk Pull over shirt/dress - Perfomed by helper: Thread/unthread right sleeve, Pull shirt over trunk        Upper body assist Assist Level: Supervision or verbal cues      Lower Body Dressing/Undressing Lower body dressing   What is the patient wearing?: Pants, Socks, Shoes Underwear - Performed by patient: Thread/unthread right underwear leg, Thread/unthread left underwear leg, Pull underwear up/down Underwear - Performed by helper: Thread/unthread right underwear leg, Thread/unthread left underwear leg, Pull underwear up/down Pants- Performed by patient: Thread/unthread right pants leg, Pull pants up/down, Thread/unthread left pants leg, Fasten/unfasten pants Pants- Performed by helper: Thread/unthread right pants leg Non-skid slipper socks- Performed by patient: Don/doff right sock, Don/doff left sock Non-skid slipper socks- Performed by helper: Don/doff right sock, Don/doff left sock Socks - Performed by patient: Don/doff right sock, Don/doff left sock Socks - Performed by helper: Don/doff right sock, Don/doff left sock Shoes - Performed by patient: Don/doff right shoe, Don/doff left shoe Shoes - Performed by helper: Don/doff right  shoe          Lower body assist Assist for lower body dressing: Touching or steadying assistance (Pt > 75%)       Toileting Toileting Toileting activity did not occur: Safety/medical concerns Toileting steps completed by patient: Adjust clothing prior to toileting, Adjust clothing after toileting Toileting steps completed by helper: Performs perineal hygiene Toileting Assistive Devices: Grab bar or rail  Toileting assist Assist level: (Mod A)   Transfers Chair/bed transfer   Chair/bed transfer method: Stand pivot Chair/bed transfer assist level: Touching or steadying assistance (Pt > 75%) Chair/bed transfer assistive device: Armrests, Walker Mechanical lift: Landscape architecttedy   Locomotion Ambulation Ambulation activity did not occur: Safety/medical concerns   Max distance: 160 Assist level: Touching or steadying assistance (Pt > 75%)   Wheelchair   Type: Manual Max wheelchair distance: 150 Assist Level: Supervision or verbal cues  Cognition Comprehension Comprehension assist level: Follows complex conversation/direction with extra time/assistive device  Expression Expression assist level: Expresses complex ideas: With extra time/assistive device  Social Interaction Social Interaction assist level: Interacts appropriately with others - No medications needed.  Problem Solving Problem solving assist level: Solves basic 75 - 89% of the time/requires cueing 10 - 24% of the time  Memory Memory assist level: Recognizes or recalls 75 - 89% of the time/requires cueing 10 - 24% of the time   Medical Problem List and Plan: 1. Right hemiplegiasecondary to left parietal intraparenchymal hemorrhage likely due to hypertension and small vessel disease  Discharge home today.  Transitional care follow-up with me in 1-2 weeks 2. DVT Prophylaxis/Anticoagulation: SCDs. Monitor for any signs of DVT  -Dopplers negative for DVT 3. Pain Management/spasticity:   -Continue baclofen 20 mg 3 times daily with good results  -PRAFO RLE while in bed  -ROM with therapy, family 4. Mood: Provide emotional support 5. Neuropsych:  This patient iscapable of making decisions on hisown behalf. 6. Skin/Wound Care: Routine skin care  7. Fluids/Electrolytes/Nutrition:     -BMP  WNL 8.CAD with CABG.  9. Hypertension. Lopressor   ---changed to 12.5mg  BID schedule he uses at home  Vitals:   10/16/17 0731 10/16/17 0746  BP:  (!) 151/77  Pulse: 61 68  Resp: 18   Temp:    SpO2: 92%     Controlled for the most part 10.COPD/tobacco abuse. Counseling.Continue inhalers  -off oxygen without issue 11. Hyponatremia: Resolved 12. Mild leukocytosis: Resolved 13. Right knee pain with edema  Xray with mild degenerative changes in the patellofemoral joint  Voltaren gel PRN--- he would like a prescription for this at home  Improved 14. Cough   resolved    LOS (Days) 37 A FACE TO FACE EVALUATION WAS PERFORMED  Faith RogueSWARTZ,ZACHARY T, MD 10/16/2017 8:10 AM

## 2017-10-16 NOTE — Plan of Care (Signed)
  Completed/Met Consults [facility] STROKE PATIENT EDUCATION Description See Patient Education module for education specifics   10/16/2017 0749 - Completed/Met by Ginette Pitman, RN Nutrition Consult-if indicated 10/16/2017 7573 - Completed/Met by Ginette Pitman, RN RH BOWEL ELIMINATION RH STG MANAGE BOWEL WITH ASSISTANCE Description STG Manage Bowel with min Assistance.   10/16/2017 0749 - Completed/Met by Ginette Pitman, RN RH STG MANAGE BOWEL W/MEDICATION W/ASSISTANCE Description STG Manage Bowel with Medication with min Assistance.   10/16/2017 0749 - Completed/Met by Ginette Pitman, RN RH BLADDER ELIMINATION RH STG MANAGE BLADDER WITH ASSISTANCE Description STG Manage Bladder With min Assistance   10/16/2017 0749 - Completed/Met by Ginette Pitman, RN RH SKIN INTEGRITY RH STG SKIN FREE OF INFECTION/BREAKDOWN Description Prevent skin breakdown or infection with mod assist  10/16/2017 0749 - Completed/Met by Ginette Pitman, RN RH SAFETY RH STG ADHERE TO SAFETY PRECAUTIONS W/ASSISTANCE/DEVICE Description STG Adhere to Safety Precautions With mod I Assistance/Device.   10/16/2017 0749 - Completed/Met by Ginette Pitman, RN RH PAIN MANAGEMENT RH STG PAIN MANAGED AT OR BELOW PT'S PAIN GOAL Description Less than 3 out of 10  10/16/2017 0749 - Completed/Met by Ginette Pitman, RN

## 2017-10-16 NOTE — Progress Notes (Signed)
Pt is sitting in his room with wife, received discharge instructions on 10/15/17.

## 2017-10-17 NOTE — Discharge Summary (Signed)
NAM[place]den Field:  Pirro, Normal             ACCOUNT NO.:  [id number]  MEDICAL RECORD NO.:  [id number]  LOCATION:  [id number]                        FACILITY:  [facility]  PHYSICIAN:  Ranelle OysterZachary T. Swartz, M.D.DATE OF BIRTH:  04/20/1946  DATE OF ADMISSION:  09/09/2017 DATE OF DISCHARGE:  10/16/2017                              DISCHARGE SUMMARY   DISCHARGE DIAGNOSES: 1. Left parietal intraparenchymal hemorrhage, likely due to     hypertension and small vessel disease. 2. Sequential compression devices for deep vein thrombosis     prophylaxis. 3. Pain management. 4. Coronary artery disease with coronary artery bypass grafting. 5. Hypertension. 6. Chronic obstructive pulmonary disease with tobacco abuse. 7. Hyponatremia, resolved. 8. Mild leukocytosis, resolved. 9. Right knee pain with edema.  This is a 71 year old right-handed male with history of CAD, CABG, hypertension, tobacco abuse.  Lives with spouse, independent prior to admission.  Presented on September 01, 2017, with acute onset of right- sided weakness and facial droop.  Blood pressure 170/90.  CT MRI showed a 4.2 x 2.5 x 3.1 cm left parietal lobe intraparenchymal hematoma. Small volume acute subarachnoid hemorrhage was at the adjacent left parietal lobe.  CT angiogram of head and neck with no evidence of aneurysm.  Follow up with Neurology Services conservative care, monitoring of blood pressure.  Echocardiogram with ejection fraction of 55%, no wall motion abnormalities.  Followup cranial CT scan showed a mild interval increase in size of left parietal lobe intraparenchymal hematoma measuring 4.7 x 5.4 x 4.9 cm.  No other acute abnormalities and again advised conservative care.  Tolerating a regular diet.  Physical and occupational therapy ongoing.  The patient was admitted for a comprehensive rehab program.  PAST MEDICAL HISTORY:  See discharge diagnoses.  SOCIAL HISTORY:  Lives with spouse, independent prior to admission.  FUNCTIONAL  STATUS UPON ADMISSION TO REHAB SERVICES:  +2 physical assist sit to stand, +2 physical assist supine to sit, mod to max assist activities of daily living.  PHYSICAL EXAMINATION:  VITAL SIGNS:  Blood pressure 125/70, pulse 81, temperature 97, and respirations 16. GENERAL:  This was an alert male, in no acute distress. HEENT:  EOMs intact. NECK:  Supple, nontender.  No JVD. CARDIAC:  Rate controlled. ABDOMEN:  Soft, nontender.  Good bowel sounds. LUNGS:  Clear to auscultation without wheeze. NEUROLOGICAL:  The patient followed full commands.  Fair awareness of deficits.  REHABILITATION HOSPITAL COURSE:  The patient was admitted to Inpatient Rehab Services with therapies initiated on a 3-hour daily basis, consisting of physical therapy, occupational therapy, speech therapy, and rehabilitation nursing.  The following issues were addressed during the patient's rehabilitation stay.  Pertaining to Mr. Elza RafterJeffers' left parietal intraparenchymal hemorrhage, remained stable, he would follow up with Neurology Services.  Close monitoring of blood pressure with Lopressor, adjusted accordingly.  Pain management, spasticity, initially on baclofen 20 mg t.i.d. with good results.  Fitted with a PRAFO to the right lower extremity.  No chest pain or shortness of breath with noted history of CAD with CABG.  COPD with tobacco abuse, again no shortness of breath, received full counsel in regard to cessation of nicotine products.  Mild hyponatremia since resolved.  He had some right knee pain  with edema.  X-ray showed mild degenerative changes in the patellofemoral joint.  Placed on Voltaren gel as needed.  The patient received weekly collaborative interdisciplinary team conferences to discuss estimated length of stay, family teaching, any barriers to his discharge.  Working with energy conservation techniques, showing improved postural control as well as increased strength, ability to compensate for  deficits right upper extremity and right lower extremity, improved attention, improved awareness, and improved coordination. Ambulating minimal guard to supervision with assistive device.  Needing some assistance for lower body dressing and grooming.  Needing encouragement at times to participate.  Discussed all bathroom modifications to implement and maximize his safety.  Again working with safety awareness.  He could communicate his needs.  Full family teaching was completed with plan discharge to home with his wife.  DISCHARGE MEDICATIONS:  Included: 1. Albuterol inhaler 2 puffs every 4 hours if needed as well as Symbicort 2 puffs every 12 hours. 2. Zyrtec 10 mg p.o. daily. 3. Vitamin B12 1000 mcg p.o. daily. 4. Nitroglycerin as needed. 5. Baclofen 20 mg 3 times a day 6. Voltaren gel 4 times daily as needed 7. Protonix 40 mg daily 8. Lopressor 12.5 mg twice a day   DIET:  He will continue on a low-sodium diet.  FOLLOWUP:  Follow up with Dr. Faith RogueZachary Swartz as indicated; Dr. Delia HeadyPramod [name], call for appointment; arrangements made to follow up with primary MD.     Mariam Dollaraniel Kourtney Montesinos, P.A.   ______________________________ Ranelle OysterZachary T. Swartz, M.D.    DA/MEDQ  D:  10/17/2017  T:  10/17/2017  Job:  [id number]  cc:   Ranelle OysterZachary T. Swartz, M.D. Georgina QuintAleksei V. Plotnikov, MD Pramod P. Pearlean BrownieSethi, MD

## 2017-10-17 NOTE — Discharge Summary (Signed)
Discharge summary job # [id number]

## 2017-10-18 ENCOUNTER — Telehealth: Payer: Self-pay | Admitting: *Deleted

## 2017-10-18 NOTE — Telephone Encounter (Signed)
Called pt to confirm appt that was made for 10/22/17 w/ Dr. Posey ReaPlotnikov spoke with pt/daughter Joice Lofts(Amber) she stated she was there sitting w/dad since mom had some errands. Dad gave authorization for me to talk w/amber. [facility] 2:30. Inform had some additional questions concerning hosp stay. Completed TCM call below.../lmb  Transition Care Management Follow-up Telephone Call   Date discharged? 10/16/17   How have you been since you were released from the hospital? Pt/daughter Catering manager(Amber) states he is fairly doing   Do you understand why you were in the hospital? YES   Do you understand the discharge instructions? YES   Where were you discharged to? Home   Items Reviewed:  Medications reviewed: YES  Allergies reviewed: YES  Dietary changes reviewed: YES  Referrals reviewed: NO   Functional Questionnaire:   Activities of Daily Living (ADLs):   She states he are independent in the following: feeding, continence, grooming and toileting States he require assistance with the following: ambulation, bathing and hygiene and dressing   Any transportation issues/concerns?: NO   Any patient concerns? NO   Confirmed importance and date/time of follow-up visits scheduled YES, appt 10/22/17  Provider Appointment booked with Dr. Posey ReaPlotnikov  Confirmed with patient if condition begins to worsen call PCP or go to the ER.  Patient was given the office number and encouraged to call back with question or concerns.  : YES

## 2017-10-19 ENCOUNTER — Encounter: Payer: Self-pay | Admitting: Physical Therapy

## 2017-10-19 ENCOUNTER — Ambulatory Visit: Payer: Medicare HMO | Attending: Physical Medicine & Rehabilitation | Admitting: Physical Therapy

## 2017-10-19 ENCOUNTER — Ambulatory Visit: Payer: Medicare HMO | Admitting: Occupational Therapy

## 2017-10-19 VITALS — BP 149/79 | HR 64

## 2017-10-19 DIAGNOSIS — R2689 Other abnormalities of gait and mobility: Secondary | ICD-10-CM | POA: Diagnosis present

## 2017-10-19 DIAGNOSIS — R2681 Unsteadiness on feet: Secondary | ICD-10-CM | POA: Insufficient documentation

## 2017-10-19 DIAGNOSIS — I69251 Hemiplegia and hemiparesis following other nontraumatic intracranial hemorrhage affecting right dominant side: Secondary | ICD-10-CM

## 2017-10-19 DIAGNOSIS — M6281 Muscle weakness (generalized): Secondary | ICD-10-CM | POA: Diagnosis present

## 2017-10-19 DIAGNOSIS — R278 Other lack of coordination: Secondary | ICD-10-CM | POA: Diagnosis present

## 2017-10-19 DIAGNOSIS — I69351 Hemiplegia and hemiparesis following cerebral infarction affecting right dominant side: Secondary | ICD-10-CM | POA: Insufficient documentation

## 2017-10-19 DIAGNOSIS — I69218 Other symptoms and signs involving cognitive functions following other nontraumatic intracranial hemorrhage: Secondary | ICD-10-CM

## 2017-10-19 DIAGNOSIS — R208 Other disturbances of skin sensation: Secondary | ICD-10-CM | POA: Insufficient documentation

## 2017-10-19 DIAGNOSIS — R41841 Cognitive communication deficit: Secondary | ICD-10-CM | POA: Insufficient documentation

## 2017-10-19 DIAGNOSIS — M25561 Pain in right knee: Secondary | ICD-10-CM | POA: Diagnosis present

## 2017-10-19 DIAGNOSIS — M25511 Pain in right shoulder: Secondary | ICD-10-CM

## 2017-10-19 NOTE — Therapy (Signed)
[facility] [address] [place], [place], [address] Phone: [phone]   Fax:  [phone]  Occupational Therapy Evaluation  Patient Details  Name: Jaime FuelRichard A Borski MRN: [id number] Date of Birth: Apr 06, 1946 Referring Provider: Dr. Dow AdolphZachery Swartz   Encounter Date: 10/19/2017  OT End of Session - 10/19/17 1045    Visit Number  1    Number of Visits  24    Date for OT Re-Evaluation  12/18/17    Authorization Type  [employer] MCR - G code required    Authorization - Visit Number  1    Authorization - Number of Visits  10    OT Start Time  0930    OT Stop Time  1015    OT Time Calculation (min)  45 min    Activity Tolerance  Patient tolerated treatment well       Past Medical History:  Diagnosis Date  . Coronary artery disease   . Dyspnea on exertion   . Hyperlipidemia   . Hypertension   . Tobacco abuse     Past Surgical History:  Procedure Laterality Date  . CORONARY ARTERY BYPASS GRAFT      There were no vitals filed for this visit.  Subjective Assessment - 10/19/17 0940    Patient is accompained by:  Family member Wife    Pertinent History  ICH Lt parietal lobe 09/01/17. PMH: CAD, SAH, COPD, s/p CABG x 6    Patient Stated Goals  be independent again    Currently in Pain?  No/denies        [facility] - 10/19/17 0001      Assessment   Diagnosis  Lt ICH  Parietal lobe    Referring Provider  Dr. Dow AdolphZachery Swartz    Onset Date  09/01/17    Assessment  arrived in w/c    Prior Therapy  CIR 09/09/17 - 10/16/17      Precautions   Precautions  Fall      Balance Screen   Has the patient fallen in the past 6 months  No    Has the patient had a decrease in activity level because of a fear of falling?   Yes    Is the patient reluctant to leave their home because of a fear of falling?   Yes      Home  Environment   Radiation protection practitionerBathroom Shower/Tub  Walk-in Shower;Door w/ grab bar and shower seat    Additional  Comments  Pt lives in 1 story home with 2 steps to enter, but has ramp now. Full basement but does not need to go down to basement. DME: Dan HumphreysWalker, w/c, BSC, Shower seat    Lives With  Spouse      Prior Function   Level of Independence  Independent and driving    Vocation  Retired    Brewing technologistLeisure  Fishing      ADL   Eating/Feeding  Needs assist with cutting food eating w/ Lt non dominant hand    Grooming  Set up w/ Lt non dominant hand    Upper Body Bathing  Minimal assistance    Lower Body Bathing  Minimal assistance    Upper Body Dressing  Increased time pullover shirts only    Lower Body Dressing  Minimal assistance occasional help especially if in a hurry    StatisticianToilet Transfer  Supervision/safety;Set up with commode chair over toilet    Toileting - Clothing Manipulation  Maximal assistance  Toileting -  Hygiene  -- dependent    Web designer  -- has not attempted yet, but has equipment    ADL comments  Pt has been sponge bathing since home from hospital. Pt has shower seat and grab bar for walk in shower but has not yet attempted      IADL   Shopping  -- dependent    Light Housekeeping  -- wife did more of this, pt did yardwork    Meal Prep  -- dependent    Data processing manager  Relies on family or friends for transportation    Medication Management  -- wife gives him for greater ease, not d/t memory deficits    Financial Management  -- wife always did       Mobility   Mobility Status  Needs assist    Mobility Status Comments  w/c dependent for community, short distances with walker       Written Expression   Dominant Hand  Right    Handwriting  -- unable d/t no control      Vision - History   Baseline Vision  Wears glasses only for reading    Additional Comments  denies change. [name] reported mild far Rt VFC initially but resolved      Cognition   Overall Cognitive Status  Impaired/Different from baseline but pt/wife reports improving      Observation/Other Assessments    Observations  Pt reports increased time and concentration needed for certain tasks      Sensation   Light Touch  Impaired Detail    Light Touch Impaired Details  Impaired RUE    Additional Comments  Rt hand inconsistent for localization, and unable to detect stimuli last 3 digits      Coordination   Box and Blocks  Rt = unable d/t no shoulder movement and decr. control w/ combined movements    Coordination  Pt can pick up block Rt hand but has no shoulder movement to do functional tasks. Pt unable to pick up pen.       Edema   Edema  moderate Rt hand      Tone   Assessment Location  Right Upper Extremity      ROM / Strength   AROM / PROM / Strength  AROM;PROM      AROM   Overall AROM Comments  LUE AROM WNL's. RUE: little to no true shoulder movement (approx. 10-15 degrees), elbow flex/ext approx 90%, supination and wrist ext 75%, full composite hand flex and extension approx 90%, but decreased control distally and no proximal movement.       PROM   Overall PROM Comments  Pt has pain with shoulder flexion 90 degrees or greater. Decreased scapulohumeral rhythm and decr. downward depression and upward rotation of Rt scapula.       Hand Function   Right Hand Grip (lbs)  10 lbs    Left Hand Grip (lbs)  94 lbs      RUE Tone   RUE Tone  Hypotonic                        OT Short Term Goals - 10/19/17 1051      OT SHORT TERM GOAL #1   Title  Independent with initial HEP     Time  4    Period  Weeks    Status  New    Target Date  11/18/17  OT SHORT TERM GOAL #2   Title  Pt/wife to verbalize understanding with edema management (including compression glove) and safety considerations w/ lack of sensation Rt hand    Time  4    Period  Weeks    Status  New      OT SHORT TERM GOAL #3   Title  Pt to demo 25 degrees shoulder flexion in prep for low level reaching RUE    Time  4    Period  Weeks    Status  New      OT SHORT TERM GOAL #4   Title  Pt to  consistently be mod I level for dressing and bathing with A/E and task modifications prn    Time  4    Period  Weeks    Status  New      OT SHORT TERM GOAL #5   Title  Pt to use Rt hand to feed self finger foods 50% of the time    Time  4    Period  Weeks    Status  New      Additional Short Term Goals   Additional Short Term Goals  Yes      OT SHORT TERM GOAL #6   Title  Pt to perform shower transfer with CGA only using DME prn    Time  4    Period  Weeks    Status  New        OT Long Term Goals - 10/19/17 1055      OT LONG TERM GOAL #1   Title  Pt to be independent with updated HEP     Time  8    Period  Weeks    Status  New    Target Date  12/18/17      OT LONG TERM GOAL #2   Title  Pt to demo shoulder flexion to 40 degrees or greater for consistent low level reaching    Time  8    Period  Weeks    Status  New      OT LONG TERM GOAL #3   Title  Pt to improve RUE functional use as evidenced by performing 10 blocks on Box & Blocks test    Time  8    Period  Weeks    Status  New      OT LONG TERM GOAL #4   Title  Pt to use Rt hand to write name at 75% legibility or greater with A/E prn    Time  8    Period  Weeks    Status  New      OT LONG TERM GOAL #5   Title  Pt to use Rt hand for eating and grooming 75% of the time    Time  8    Period  Weeks    Status  New      Long Term Additional Goals   Additional Long Term Goals  Yes      OT LONG TERM GOAL #6   Title  Pt to return to simple cooking task with distant supervision and A/E prn    Time  8    Period  Weeks    Status  New            Plan - 10/19/17 1046    Clinical Impression Statement  Pt is a 71 y.o. male who presents to outpatient rehab s/p ICH on 09/01/17 with Rt dominant  side hemiplegia. Pt was on CIR from 09/09/17 - 10/16/17. Pt now with limited ability to use Rt dominant UE for any functional tasks including ADLS. Pt also requires assist for BADLS, dependent for IALDS, and only able to  walk short distances w/ walker. Pt w/ edema and decreased sensation Rt hand.     Occupational Profile and client history currently impacting functional performance  PMH: CAD, HTN, SAH, s/p CABG x 6, COPD    Occupational performance deficits (Please refer to evaluation for details):  ADL's;IADL's;Leisure;Social Participation    Rehab Potential  Good    OT Frequency  3x / week    OT Duration  8 weeks    OT Treatment/Interventions  Self-care/ADL training;Moist Heat;DME and/or AE instruction;Splinting;Patient/family education;Compression bandaging;Therapeutic exercises;Therapeutic activities;Neuromuscular education;Aquatic Therapy;Functional Mobility Training;Passive range of motion;Cognitive remediation/compensation;Parrafin;Visual/perceptual remediation/compensation;Electrical Stimulation    Plan  HEP, edema glove for Rt hand     Clinical Decision Making  Multiple treatment options, significant modification of task necessary       Patient will benefit from skilled therapeutic intervention in order to improve the following deficits and impairments:  Decreased coordination, Decreased range of motion, Increased edema, Impaired sensation, Decreased knowledge of precautions, Impaired tone, Decreased activity tolerance, Decreased balance, Decreased knowledge of use of DME, Impaired UE functional use, Pain, Decreased cognition, Decreased mobility, Decreased strength, Impaired vision/preception  Visit Diagnosis: Hemiplegia and hemiparesis following cerebral infarction affecting right dominant side (HCC) - Plan: Ot plan of care cert/re-cert  Acute pain of right shoulder - Plan: Ot plan of care cert/re-cert  Other lack of coordination - Plan: Ot plan of care cert/re-cert  Unsteadiness on feet - Plan: Ot plan of care cert/re-cert  Other symptoms and signs involving cognitive functions following other nontraumatic intracranial hemorrhage - Plan: Ot plan of care cert/re-cert  G-Codes - 10/19/17 1058     Functional Assessment Tool Used (Outpatient only)  RUE: unable to use functionally at this time    Functional Limitation  Carrying, moving and handling objects    Carrying, Moving and Handling Objects Current Status ([id number])  At least 80 percent but less than 100 percent impaired, limited or restricted    Carrying, Moving and Handling Objects Goal Status (U0454(G8985)  At least 40 percent but less than 60 percent impaired, limited or restricted       Problem List Patient Active Problem List   Diagnosis Date Noted  . Spastic hemiparesis (HCC)   . Cough   . 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 Pain   . Hyponatremia   . Benign essential HTN   . Intraparenchymal hemorrhage of brain (HCC) 09/09/2017  . 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 Gait disturbance, post-stroke   . Aphasia as late effect of stroke   . SAH (subarachnoid hemorrhage) (HCC) 09/02/2017  . B12 deficiency 09/02/2017  . Well adult exam 06/16/2016  . Cigarette smoker 02/23/2016  . COPD GOLD II  12/24/2014  . CAD (coronary artery disease) 07/22/2011  . HTN (hypertension) 07/22/2011  . Dyslipidemia 07/22/2011  . Smoker 07/22/2011    Kelli ChurnBallie, Mohan Erven Johnson, OTR/L 10/19/2017, 2:30 PM  [facility] [address] [place], [place], [address] Phone: [phone]   Fax:  [phone]  Name: Jaime FuelRichard A Hally MRN: [id number] Date of Birth: 08-May-1946

## 2017-10-19 NOTE — Therapy (Signed)
[facility] [address] [place], [place], [address] Phone: [phone]   Fax:  [phone]  Physical Therapy Evaluation  Patient Details  Name: Jaime Pierce MRN: [id number] Date of Birth: Aug 05, 1946 Referring Provider: Ranelle Oyster, MD   Encounter Date: 10/19/2017  PT End of Session - 10/19/17 2112    Visit Number  1    Number of Visits  25    Date for PT Re-Evaluation  12/18/17    Authorization Type  [employer] Medicare - G code and PN every 10th visit    PT Start Time  1020    PT Stop Time  1113    PT Time Calculation (min)  53 min    Activity Tolerance  Patient tolerated treatment well    Behavior During Therapy  [facility] for tasks assessed/performed       Past Medical History:  Diagnosis Date  . Coronary artery disease   . Dyspnea on exertion   . Hyperlipidemia   . Hypertension   . Tobacco abuse     Past Surgical History:  Procedure Laterality Date  . CORONARY ARTERY BYPASS GRAFT      Vitals:   10/19/17 1050  BP: (!) 149/79  Pulse: 64     Subjective Assessment - 10/19/17 1025    Subjective  History provided by pt and wife: Woke up one morning with multiple symptoms of CVA: RUE and facial weakness, imbalance, and speech difficulty.  Imaging demonstrated non-traumatic L parietal ICH.  Pt admitted to CIR requiring +2 for mobility, demonstrated significant cognitive impairments and only able to move toes of RLE.  Upon D/C from CIR pt was ambulating with RW 165' and demonstrated active movement in hip, knee and ankle of RLE.  D/C home from CIR on 11/24.  Pt ambulates short distance with RW but mainly uses w/c at home.  No falls in hospital or since being home, sleeping in regular bed.  Can perform stand pivot transfers with supervision.  Enters/exits house with w/c on temporary ramp.      Patient is accompained by:  Family member    Pertinent History  CAD with CABG, DOE, hyperlipidemia, HTN and tobacco  abuse    Limitations  Standing;Walking    How long can you walk comfortably?  19'    Patient Stated Goals  To be able to do the things he did before: mow with his tractor, get into and out of the boat for fishing!    Currently in Pain?  No/denies         [facility] PT Assessment - 10/19/17 1035      Assessment   Medical Diagnosis  L parietal ICH; R hemiplegia    Referring Provider  Ranelle Oyster, MD    Hand Dominance  Right    Prior Therapy  acute care, CIR      Precautions   Precautions  Other (comment)    Precaution Comments  CAD with CABG, DOE, hyperlipidemia, HTN and tobacco abuse      Balance Screen   Has the patient fallen in the past 6 months  No    Has the patient had a decrease in activity level because of a fear of falling?   Yes    Is the patient reluctant to leave their home because of a fear of falling?   No      Home Public house manager residence    Living Arrangements  Spouse/significant other  Type of Home  House    Home Access  Ramped entrance    Home Layout  One level    Home Equipment  Walker - 2 wheels;Wheelchair - manual;Shower seat;Bedside commode      Prior Function   Level of Independence  Independent    Vocation  Retired    Leisure  fishing and mowing with his tractor      Observation/Other Assessments   Focus on Therapeutic Outcomes (FOTO)   28(72% limited; predicted 48% limitation by D/C)      Observation/Other Assessments-Edema    Edema  -- RUE and hand      Sensation   Light Touch  Impaired Detail    Light Touch Impaired Details  Impaired RUE;Impaired RLE    Proprioception  Impaired Detail    Proprioception Impaired Details  Impaired RLE      Posture/Postural Control   Posture Comments  R shoulder depressed, scapula depressed and protracted      Tone   Assessment Location  Right Lower Extremity      ROM / Strength   AROM / PROM / Strength  Strength      Strength   Overall Strength  Deficits    Overall  Strength Comments  LLE: 5/5; RLE: 3+/5 initial contraction but unable to maintain contraction, poor eccentric control.        Transfers   Transfers  Sit to Stand;Stand to Dollar GeneralSit;Stand Pivot Transfers    Sit to Stand  4: Min assist    Sit to Stand Details (indicate cue type and reason)  from w/c and then from low mat with assistance on R side for R foot placement, to maintain R hip neutral rotation and for full anterior weight shift over BOS; verbal cues for safe hand placement during sit > stand    Stand to Sit  4: Min assist    Stand Pivot Transfers  4: Min assist    Stand Pivot Transfer Details (indicate cue type and reason)  cues for full pivot and sequencing with placing RUE into hand orthosis and removing hand prior to sitting      Ambulation/Gait   Ambulation/Gait  Yes    Ambulation/Gait Assistance  4: Min assist    Ambulation Distance (Feet)  100 Feet    Assistive device  Rolling walker    Gait Pattern  Step-to pattern;Decreased step length - right;Decreased stride length;Decreased hip/knee flexion - right;Decreased dorsiflexion - right;Decreased trunk rotation;Narrow base of support;Poor foot clearance - right    Ambulation Surface  Level;Indoor    Gait velocity  1.30 seconds or .36 ft/sec      Standardized Balance Assessment   Standardized Balance Assessment  10 meter walk test;Timed Up and Go Test    10 Meter Walk  1.30 seconds or .36 ft/sec with RW      RLE Tone   RLE Tone  Modified Ashworth;Hypertonic      RLE Tone   Modified Ashworth Scale for Grading Hypertonia RLE  Slight increase in muscle tone, manifested by a catch and release or by minimal resistance at the end of the range of motion when the affected part(s) is moved in flexion or extension             Objective measurements completed on examination: See above findings.              PT Education - 10/19/17 2046    Education provided  Yes    Education Details  clinical findings,  PT POC and goals     Person(s) Educated  Patient;Spouse    Methods  Explanation    Comprehension  Verbalized understanding       PT Short Term Goals - 10/19/17 2122      PT SHORT TERM GOAL #1   Title  Pt will participate in further assessment of balance with TUG and five time sit to stand with RW    Time  4    Period  Weeks    Status  New    Target Date  11/18/17      PT SHORT TERM GOAL #2   Title  Pt will perform sit <> stand and stand pivot transfers with LRAD and supervision with <10% cues for safety/sequencing    Baseline  min A with 50% cues    Time  4    Period  Weeks    Status  New    Target Date  11/18/17      PT SHORT TERM GOAL #3   Title  Pt will ambulate x 250' on level, indoor surfaces (to/from waiting area) and will negotiate short ramp to simulate home entry/exit with LRAD and min A     Baseline  165' with RW min A, w/c for majority of mobility and to enter/exit home on ramp    Time  4    Period  Weeks    Status  New    Target Date  11/18/17      PT SHORT TERM GOAL #4   Title  Pt will improve gait velocity to > or = 1.0 ft/sec    Baseline  .36 ft/sec with RW    Time  4    Period  Weeks    Status  New    Target Date  11/18/17      PT SHORT TERM GOAL #5   Title  Pt will initiate gait training with cane or lesser AD over level, indoor surfaces with therapy    Time  4    Period  Weeks    Status  New    Target Date  11/18/17        PT Long Term Goals - 10/19/17 2130      PT LONG TERM GOAL #1   Title  Pt and wife will demonstrate independence with HEP    Time  8    Period  Weeks    Status  New    Target Date  12/18/17      PT LONG TERM GOAL #2   Title  Pt will decrease TUG time by 4 seconds with LRAD    Baseline  TBD    Time  8    Period  Weeks    Status  New    Target Date  12/18/17      PT LONG TERM GOAL #3   Title  Pt will improve LE strength as indicated by decrease in five time sit to stand by 4 seconds    Baseline  TBD    Time  8    Period  Weeks     Status  New    Target Date  12/18/17      PT LONG TERM GOAL #4   Title  Pt will perform bed mobility and all transfers with LRAD MOD I    Time  8    Period  Weeks    Status  New    Target Date  12/18/17  PT LONG TERM GOAL #5   Title  Pt will ambulate >300 over indoor and paved outdoor surfaces and negotiate ramp with LRAD and supervision for more independent household and community mobility    Time  8    Period  Weeks    Status  New    Target Date  12/18/17      Additional Long Term Goals   Additional Long Term Goals  Yes      PT LONG TERM GOAL #6   Title  Pt will improve gait velocity to > or = 1.69ft/sec with LRAD    Time  8    Period  Weeks    Status  New    Target Date  12/18/17      PT LONG TERM GOAL #7   Title  Pt will improve overall function on FOTO by 25 points    Baseline  28%    Time  8    Period  Weeks    Status  New    Target Date  12/18/17             Plan - 10/19/17 2113    Clinical Impression Statement  Pt is a 71 year old male referred to OPP[facility] neuro for evaluation of L parietal non-traumatic ICH with R dominant hemiplegia.  Pt's PMH is significant for the following: CAD with CABG, DOE, hyperlipidemia, HTN and tobacco abuse. The following deficits were noted during pt's exam: R dominant hemiplegia with impaired sensation and proprioception, impaired strength, impaired motor planning, pain in R knee, impaired endurance, hypertonicity, impaired postural control, balance and gait.  Pt's gait speed indicates pt is at significant risk for falls.  Pt would benefit from skilled PT to address these impairments and functional limitations to maximize functional mobility independence and reduce falls risk.    History and Personal Factors relevant to plan of care:  independent prior to CVA, CAD with CABG, DOE, hyperlipidemia, HTN and tobacco abuse    Clinical Presentation  Evolving    Clinical Presentation due to:  independent prior to CVA, CAD with CABG, DOE,  hyperlipidemia, HTN and tobacco abuse    Clinical Decision Making  Moderate    Rehab Potential  Good    PT Frequency  3x / week    PT Duration  8 weeks    PT Treatment/Interventions  ADLs/Self Care Home Management;Electrical Stimulation;DME Instruction;Gait training;Stair training;Functional mobility training;Therapeutic activities;Therapeutic exercise;Balance training;Neuromuscular re-education;Cognitive remediation;Patient/family education;Orthotic Fit/Training;Passive range of motion;Energy conservation;Taping;Manual techniques    PT Next Visit Plan  Assess TUG and five times sit to stand with RW and please revise baseline for LTG, initiate stretching and strengthening HEP with pt and wife; sit <> stand training from low surfaces, R NMR    PT Home Exercise Plan  pt goals include mowing with his tractor and fishing on his boat - incorporate into balance activities    Consulted and Agree with Plan of Care  Patient;Family member/caregiver    Family Member Consulted  wife       Patient will benefit from skilled therapeutic intervention in order to improve the following deficits and impairments:  Abnormal gait, Cardiopulmonary status limiting activity, Decreased balance, Decreased cognition, Decreased endurance, Decreased mobility, Decreased strength, Difficulty walking, Increased muscle spasms, Impaired sensation, Impaired tone, Impaired UE functional use, Postural dysfunction, Pain, Decreased range of motion, Increased edema  Visit Diagnosis: 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) - Plan: PT plan of care cert/re-cert  Other disturbances of skin sensation - Plan: PT plan of care cert/re-cert  Muscle weakness (generalized) - Plan: PT plan of care cert/re-cert  Other abnormalities of gait and mobility - Plan: PT plan of care cert/re-cert  Right knee pain, unspecified chronicity - Plan: PT plan of care  cert/re-cert  G-Codes - 11-11-17 April 23, 2137    Functional Assessment Tool Used (Outpatient Only)  FOTO: 28%/72% limited; min A transfers and gait with RW; gait velocity .57ft/sec    Functional Limitation  Mobility: Walking and moving around    Mobility: Walking and Moving Around Current Status [phone])  At least 60 percent but less than 80 percent impaired, limited or restricted    Mobility: Walking and Moving Around Goal Status [phone])  At least 20 percent but less than 40 percent impaired, limited or restricted        Problem List Patient Active Problem List   Diagnosis Date Noted  . Spastic hemiparesis (HCC)   . Cough   . 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 Pain   . Hyponatremia   . Benign essential HTN   . Intraparenchymal hemorrhage of brain (HCC) 09/09/2017  . 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 Gait disturbance, post-stroke   . Aphasia as late effect of stroke   . SAH (subarachnoid hemorrhage) (HCC) 09/02/2017  . B12 deficiency 09/02/2017  . Well adult exam 06/16/2016  . Cigarette smoker 02/23/2016  . COPD GOLD II  12/24/2014  . CAD (coronary artery disease) 07/22/2011  . HTN (hypertension) 07/22/2011  . Dyslipidemia 07/22/2011  . Smoker 07/22/2011    Dierdre Highman, PT, DPT 11/11/17    9:44 PM    [facility] [address] [place], [place], [address] Phone: [phone]   Fax:  [phone]  Name: Jaime Pierce MRN: [id number] Date of Birth: [birth year]

## 2017-10-21 ENCOUNTER — Ambulatory Visit: Payer: Medicare HMO | Admitting: Occupational Therapy

## 2017-10-21 ENCOUNTER — Ambulatory Visit: Payer: Medicare HMO

## 2017-10-21 VITALS — BP 150/84 | HR 74

## 2017-10-21 DIAGNOSIS — I69351 Hemiplegia and hemiparesis following cerebral infarction affecting right dominant side: Secondary | ICD-10-CM

## 2017-10-21 DIAGNOSIS — I69251 Hemiplegia and hemiparesis following other nontraumatic intracranial hemorrhage affecting right dominant side: Secondary | ICD-10-CM | POA: Diagnosis not present

## 2017-10-21 DIAGNOSIS — M25511 Pain in right shoulder: Secondary | ICD-10-CM

## 2017-10-21 DIAGNOSIS — R278 Other lack of coordination: Secondary | ICD-10-CM

## 2017-10-21 DIAGNOSIS — R2681 Unsteadiness on feet: Secondary | ICD-10-CM

## 2017-10-21 NOTE — Therapy (Signed)
[facility] [address] [place], [place], [address] Phone: [phone]   Fax:  [phone]  Physical Therapy Treatment  Patient Details  Name: Jaime FuelRichard A Dina MRN: [id number] Date of Birth: 02-01-1946 Referring Provider: Ranelle OysterZachary T. Swartz, MD   Encounter Date: 10/21/2017  PT End of Session - 10/21/17 1202    Visit Number  2    Number of Visits  25    Date for PT Re-Evaluation  12/18/17    Authorization Type  [employer] Medicare - G code and PN every 10th visit    PT Start Time  1019 pt late    PT Stop Time  1103    PT Time Calculation (min)  44 min    Equipment Utilized During Treatment  Gait belt    Activity Tolerance  Patient tolerated treatment well    Behavior During Therapy  WFL for tasks assessed/performed       Past Medical History:  Diagnosis Date  . Coronary artery disease   . Dyspnea on exertion   . Hyperlipidemia   . Hypertension   . Tobacco abuse     Past Surgical History:  Procedure Laterality Date  . CORONARY ARTERY BYPASS GRAFT      Vitals:   10/21/17 1041  BP: (!) 150/84  Pulse: 74  SpO2: 95%    Subjective Assessment - 10/21/17 1022    Subjective  Pt denied falls or changes since last visit. Pt reported his father is ill and was transferred to hospice care this week.     Patient is accompained by:  Family member Wife: [name]    Pertinent History  CAD with CABG, DOE, hyperlipidemia, HTN and tobacco abuse    Patient Stated Goals  To be able to do the things he did before: mow with his tractor, get into and out of the boat for fishing!    Currently in Pain?  No/denies          Therex: PT provided pt with strengthening and flexibility exercises, which pt tolerated well. No c/o during session.  Please see pt instructions for HEP details.            [facility] Adult PT Treatment/Exercise - 10/21/17 1023      Transfers   Transfers  Sit to Stand;Stand to Dollar GeneralSit;Stand Pivot Transfers     Sit to Stand  4: Min assist;With upper extremity assist    Sit to Stand Details  Tactile cues for initiation;Tactile cues for sequencing;Verbal cues for sequencing;Verbal cues for technique;Manual facilitation for placement    Sit to Stand Details (indicate cue type and reason)  To/from w/c and mat with cues for RLE/UE placement and ant. weight shifting.    Five time sit to stand comments   17.27sec.    Stand to Sit  4: Min assist;With upper extremity assist    Stand to Sit Details (indicate cue type and reason)  Verbal cues for sequencing;Verbal cues for technique;Manual facilitation for placement;Visual cues for safe use of DME/AE    Stand to Sit Details  Cues to actively lift RUE from R hand orthosis on RW and to improve eccentric control.     Stand Pivot Transfers  4: Min assist    Stand Pivot Transfer Details (indicate cue type and reason)  Cues to improve lateral weight shifting    Number of Reps  10 reps      Ambulation/Gait   Ambulation/Gait  Yes    Ambulation/Gait Assistance  4: Min  assist    Ambulation/Gait Assistance Details  Cues to improve advancement of RLE, improve R knee/hip flexion and R heel strike.    Ambulation Distance (Feet)  20 Feet x2    Assistive device  Rolling walker with R h/o    Gait Pattern  Step-to pattern;Decreased step length - right;Decreased stride length;Decreased hip/knee flexion - right;Decreased dorsiflexion - right;Decreased trunk rotation;Narrow base of support;Poor foot clearance - right    Ambulation Surface  Level;Indoor      Standardized Balance Assessment   Standardized Balance Assessment  Timed Up and Go Test      Timed Up and Go Test   TUG  Normal TUG    Normal TUG (seconds)  62.62 with RW             PT Education - 10/21/17 1200    Education provided  Yes    Education Details  PT discussed outcome measure results and initiated strengthening and flexibility HEP.     Person(s) Educated  Patient;Spouse    Methods   Explanation;Demonstration;Tactile cues;Verbal cues;Handout    Comprehension  Returned demonstration;Verbalized understanding;Need further instruction       PT Short Term Goals - 10/21/17 1205      PT SHORT TERM GOAL #1   Title  Pt will participate in further assessment of balance with TUG and five time sit to stand with RW    Time  4    Period  Weeks    Status  Achieved      PT SHORT TERM GOAL #2   Title  Pt will perform sit <> stand and stand pivot transfers with LRAD and supervision with <10% cues for safety/sequencing    Baseline  min A with 50% cues    Time  4    Period  Weeks    Status  New      PT SHORT TERM GOAL #3   Title  Pt will ambulate x 250' on level, indoor surfaces (to/from waiting area) and will negotiate short ramp to simulate home entry/exit with LRAD and min A     Baseline  165' with RW min A, w/c for majority of mobility and to enter/exit home on ramp    Time  4    Period  Weeks    Status  New      PT SHORT TERM GOAL #4   Title  Pt will improve gait velocity to > or = 1.0 ft/sec    Baseline  .36 ft/sec with RW    Time  4    Period  Weeks    Status  New      PT SHORT TERM GOAL #5   Title  Pt will initiate gait training with cane or lesser AD over level, indoor surfaces with therapy    Time  4    Period  Weeks    Status  New        PT Long Term Goals - 10/21/17 1205      PT LONG TERM GOAL #1   Title  Pt and wife will demonstrate independence with HEP    Time  8    Period  Weeks    Status  New      PT LONG TERM GOAL #2   Title  Pt will decrease TUG time by  seconds with LRAD to </=47 sec. to decr. falls risk.    Baseline  62.62 sec. with RW and R hand orthosis on RW  Time  8    Period  Weeks    Status  New      PT LONG TERM GOAL #3   Title  Pt will improve LE strength as indicated by decrease in five time sit to stand to <12 sec.    Baseline  TBD    Time  8    Period  Weeks    Status  New      PT LONG TERM GOAL #4   Title  Pt will  perform bed mobility and all transfers with LRAD MOD I    Time  8    Period  Weeks    Status  New      PT LONG TERM GOAL #5   Title  Pt will ambulate >300 over indoor and paved outdoor surfaces and negotiate ramp with LRAD and supervision for more independent household and community mobility    Time  8    Period  Weeks    Status  New      PT LONG TERM GOAL #6   Title  Pt will improve gait velocity to > or = 1.106ft/sec with LRAD    Time  8    Period  Weeks    Status  New      PT LONG TERM GOAL #7   Title  Pt will improve overall function on FOTO by 25 points    Baseline  28%    Time  8    Period  Weeks    Status  New            Plan - 10/21/17 1202    Clinical Impression Statement  Pt's TUG time indicates pt is at risk for falls. Pt's 5 time STS test time (17.27 sec.) is below normal for older adults (<12 sec.). Today's skilled session focused on initiating flexibility and functional strengthening HEP. [name] assisted in supine piriformis stretch 2/2 R hemiparesis and spasticity. Pt would continue to benefit from skilled PT to improve safety during functional mobility.     Rehab Potential  Good    PT Frequency  3x / week    PT Duration  8 weeks    PT Treatment/Interventions  ADLs/Self Care Home Management;Electrical Stimulation;DME Instruction;Gait training;Stair training;Functional mobility training;Therapeutic activities;Therapeutic exercise;Balance training;Neuromuscular re-education;Cognitive remediation;Patient/family education;Orthotic Fit/Training;Passive range of motion;Energy conservation;Taping;Manual techniques    PT Next Visit Plan  Complete and review stretching and strengthening HEP with pt and wife; sit <> stand training from low surfaces, R NMR    PT Home Exercise Plan  pt goals include mowing with his tractor and fishing on his boat - incorporate into balance activities    Consulted and Agree with Plan of Care  Patient;Family member/caregiver    Family Member  Consulted  wife       Patient will benefit from skilled therapeutic intervention in order to improve the following deficits and impairments:  Abnormal gait, Cardiopulmonary status limiting activity, Decreased balance, Decreased cognition, Decreased endurance, Decreased mobility, Decreased strength, Difficulty walking, Increased muscle spasms, Impaired sensation, Impaired tone, Impaired UE functional use, Postural dysfunction, Pain, Decreased range of motion, Increased edema  Visit Diagnosis: Hemiplegia and hemiparesis following cerebral infarction affecting right dominant side (HCC)  Unsteadiness on feet     Problem List Patient Active Problem List   Diagnosis Date Noted  . Spastic hemiparesis (HCC)   . Cough   . 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 Pain   . Hyponatremia   .  Benign essential HTN   . Intraparenchymal hemorrhage of brain (HCC) 09/09/2017  . 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 Gait disturbance, post-stroke   . Aphasia as late effect of stroke   . SAH (subarachnoid hemorrhage) (HCC) 09/02/2017  . B12 deficiency 09/02/2017  . Well adult exam 06/16/2016  . Cigarette smoker 02/23/2016  . COPD GOLD II  12/24/2014  . CAD (coronary artery disease) 07/22/2011  . HTN (hypertension) 07/22/2011  . Dyslipidemia 07/22/2011  . Smoker 07/22/2011    Daesia Zylka L 10/21/2017, 12:14 PM  [facility] [address] [place], [place], [address] Phone: [phone]   Fax:  [phone]  Name: Jaime FuelRichard A Saltos MRN: [id number] Date of Birth: November 13, 1946  Zerita BoersJennifer Egan Sahlin, PT,DPT 10/21/17 12:19 PM Phone: [phone] Fax: [phone]

## 2017-10-21 NOTE — Therapy (Signed)
[facility] [address] [place], [place], [address] Phone: [phone]   Fax:  [phone]  Occupational Therapy Treatment  Patient Details  Name: Jaime Pierce MRN: [id number] Date of Birth: 21-Nov-1946 Referring Provider: Dr. Dow Adolph   Encounter Date: 10/21/2017  OT End of Session - 10/21/17 1213    Visit Number  2    Number of Visits  24    Date for OT Re-Evaluation  12/18/17    Authorization Type  [employer] MCR - G code required    Authorization - Visit Number  2    Authorization - Number of Visits  10    OT Start Time  1100    OT Stop Time  1145    OT Time Calculation (min)  45 min    Activity Tolerance  Patient tolerated treatment well    Behavior During Therapy  [facility] for tasks assessed/performed       Past Medical History:  Diagnosis Date  . Coronary artery disease   . Dyspnea on exertion   . Hyperlipidemia   . Hypertension   . Tobacco abuse     Past Surgical History:  Procedure Laterality Date  . CORONARY ARTERY BYPASS GRAFT      There were no vitals filed for this visit.  Subjective Assessment - 10/21/17 1110    Patient is accompained by:  Family member    Pertinent History  ICH Lt parietal lobe 09/01/17. PMH: CAD, SAH, COPD, s/p CABG x 6    Patient Stated Goals  be independent again    Currently in Pain?  No/denies                   OT Treatments/Exercises (OP) - 10/21/17 0001      ADLs   ADL Comments  Pt/wife educated in edema management techniques for Rt hand including use of compression glove and wearing time, retrograde massage, elevation, and specific ex's. Pt/wife issued handout (not in EPIC)       Neurological Re-education Exercises   Other Exercises 1  Pt issued initial HEP for RUE for neuro re-education - pt demo each. See pt instructions for details.     Other Exercises 2  Passively stretched Rt shoulder slightly beyond 90* in small circumduction and  abduction while pt in supine    Other Weight-Bearing Exercises 1  Wt bearing through RUE while cross reaching LUE, followed by sit to squat ex's with LUE disengaged to activate Rt side             OT Education - 10/21/17 1137    Education provided  Yes    Education Details  Edema mangement including compression glove, initial HEP    Person(s) Educated  Patient;Spouse    Methods  Explanation;Demonstration;Verbal cues;Handout    Comprehension  Verbalized understanding;Returned demonstration       OT Short Term Goals - 10/21/17 1218      OT SHORT TERM GOAL #1   Title  Independent with initial HEP     Time  4    Period  Weeks    Status  On-going      OT SHORT TERM GOAL #2   Title  Pt/wife to verbalize understanding with edema management (including compression glove) and safety considerations w/ lack of sensation Rt hand    Time  4    Period  Weeks    Status  On-going      OT SHORT TERM GOAL #3  Title  Pt to demo 25 degrees shoulder flexion in prep for low level reaching RUE    Time  4    Period  Weeks    Status  New      OT SHORT TERM GOAL #4   Title  Pt to consistently be mod I level for dressing and bathing with A/E and task modifications prn    Time  4    Period  Weeks    Status  New      OT SHORT TERM GOAL #5   Title  Pt to use Rt hand to feed self finger foods 50% of the time    Time  4    Period  Weeks    Status  New      OT SHORT TERM GOAL #6   Title  Pt to perform shower transfer with CGA only using DME prn    Time  4    Period  Weeks    Status  New        OT Long Term Goals - 10/19/17 1055      OT LONG TERM GOAL #1   Title  Pt to be independent with updated HEP     Time  8    Period  Weeks    Status  New    Target Date  12/18/17      OT LONG TERM GOAL #2   Title  Pt to demo shoulder flexion to 40 degrees or greater for consistent low level reaching    Time  8    Period  Weeks    Status  New      OT LONG TERM GOAL #3   Title  Pt to  improve RUE functional use as evidenced by performing 10 blocks on Box & Blocks test    Time  8    Period  Weeks    Status  New      OT LONG TERM GOAL #4   Title  Pt to use Rt hand to write name at 75% legibility or greater with A/E prn    Time  8    Period  Weeks    Status  New      OT LONG TERM GOAL #5   Title  Pt to use Rt hand for eating and grooming 75% of the time    Time  8    Period  Weeks    Status  New      Long Term Additional Goals   Additional Long Term Goals  Yes      OT LONG TERM GOAL #6   Title  Pt to return to simple cooking task with distant supervision and A/E prn    Time  8    Period  Weeks    Status  New            Plan - 10/21/17 1219    Clinical Impression Statement  Pt tolerating self ROM ex's well. Pt with little proximal movement RUE    Rehab Potential  Good    OT Frequency  2x / week    OT Duration  8 weeks    OT Treatment/Interventions  Self-care/ADL training;Moist Heat;DME and/or AE instruction;Splinting;Patient/family education;Compression bandaging;Therapeutic exercises;Therapeutic activities;Neuromuscular education;Aquatic Therapy;Functional Mobility Training;Passive range of motion;Cognitive remediation/compensation;Parrafin;Visual/perceptual remediation/compensation;Electrical Stimulation    Plan  sidelying: RUE AA/ROM and scapula mobilization, tilted stool activity    Consulted and Agree with Plan of Care  Patient;Family member/caregiver    Family  Member Consulted  wife       Patient will benefit from skilled therapeutic intervention in order to improve the following deficits and impairments:  Decreased coordination, Decreased range of motion, Increased edema, Impaired sensation, Decreased knowledge of precautions, Impaired tone, Decreased activity tolerance, Decreased balance, Decreased knowledge of use of DME, Impaired UE functional use, Pain, Decreased cognition, Decreased mobility, Decreased strength, Impaired  vision/preception  Visit Diagnosis: Hemiplegia and hemiparesis following cerebral infarction affecting right dominant side (HCC)  Acute pain of right shoulder  Other lack of coordination    Problem List Patient Active Problem List   Diagnosis Date Noted  . Spastic hemiparesis (HCC)   . Cough   . 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 Pain   . Hyponatremia   . Benign essential HTN   . Intraparenchymal hemorrhage of brain (HCC) 09/09/2017  . 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 Gait disturbance, post-stroke   . Aphasia as late effect of stroke   . SAH (subarachnoid hemorrhage) (HCC) 09/02/2017  . B12 deficiency 09/02/2017  . Well adult exam 06/16/2016  . Cigarette smoker 02/23/2016  . COPD GOLD II  12/24/2014  . CAD (coronary artery disease) 07/22/2011  . HTN (hypertension) 07/22/2011  . Dyslipidemia 07/22/2011  . Smoker 07/22/2011    Kelli ChurnBallie, Kylah Maresh Johnson, OTR/L 10/21/2017, 12:22 PM  [facility] [address] [place], [place], [address] Phone: [phone]   Fax:  [phone]  Name: Delora FuelRichard A Schaab MRN: [id number] Date of Birth: 1946/09/10

## 2017-10-21 NOTE — Patient Instructions (Signed)
HIP: Hamstrings - Short Sitting    Rest leg on raised surface. Keep knee straight. Lift chest. Hold __30_ seconds. __3_ reps per set, _2-3__ sets per day, _7__ days per week  Copyright  VHI. All rights reserved.   Piriformis Stretch - Supine    Have a helper pull uninvolved knee across body toward opposite shoulder. Hold slight stretch for _30__ seconds. Repeat with involved leg. Repeat __3_ times. Do _2-3__ times per day.  Copyright  VHI. All rights reserved.    Functional Quadriceps: Sit to Stand    Use walker in front of you. Sit on edge of chair, feet flat on floor. Stand upright, extending knees fully. Repeat __10 times per session. Repeat 2 times a day. Perform every day.  http://orth.exer.us/734   Copyright  VHI. All rights reserved.

## 2017-10-21 NOTE — Patient Instructions (Signed)
   EXERCISES:   1. Laying down - Hold Rt wrist with Lt hand, thumb side up, raise arm up towards ceiling x 10 reps, 2-3 times per day  2. Table slides - place Rt hand on folded hand towel and guide arm with Lt hand forwards and backwards on table x 10 reps, 2-3 times per day  3. Sitting up tall, elbow bent, turn palm up as much as you can, then stretch further with other hand holding 5-10 sec. and then turn palm down. Do not tense shoulders. Repeat 10 times, 2-3 times per day  4. Sitting up tall, hang hand over edge of pillow, lift wrist up and down x 10 reps, 2 - 3 times per day. Make sure wrist is going straight up and not towards little finger side  5. Make full fist and then open hand completely

## 2017-10-22 ENCOUNTER — Ambulatory Visit: Payer: Medicare HMO

## 2017-10-22 ENCOUNTER — Encounter: Payer: Self-pay | Admitting: Internal Medicine

## 2017-10-22 ENCOUNTER — Ambulatory Visit (INDEPENDENT_AMBULATORY_CARE_PROVIDER_SITE_OTHER): Payer: Medicare HMO | Admitting: Internal Medicine

## 2017-10-22 ENCOUNTER — Other Ambulatory Visit: Payer: Self-pay

## 2017-10-22 VITALS — BP 142/80 | HR 86 | Temp 98.2°F | Ht 73.0 in | Wt 172.0 lb

## 2017-10-22 DIAGNOSIS — E538 Deficiency of other specified B group vitamins: Secondary | ICD-10-CM | POA: Diagnosis not present

## 2017-10-22 DIAGNOSIS — I10 Essential (primary) hypertension: Secondary | ICD-10-CM | POA: Diagnosis not present

## 2017-10-22 DIAGNOSIS — R269 Unspecified abnormalities of gait and mobility: Secondary | ICD-10-CM | POA: Diagnosis not present

## 2017-10-22 DIAGNOSIS — I69398 Other sequelae of cerebral infarction: Secondary | ICD-10-CM | POA: Diagnosis not present

## 2017-10-22 DIAGNOSIS — R278 Other lack of coordination: Secondary | ICD-10-CM

## 2017-10-22 DIAGNOSIS — R41841 Cognitive communication deficit: Secondary | ICD-10-CM

## 2017-10-22 DIAGNOSIS — I69351 Hemiplegia and hemiparesis following cerebral infarction affecting right dominant side: Secondary | ICD-10-CM

## 2017-10-22 DIAGNOSIS — I612 Nontraumatic intracerebral hemorrhage in hemisphere, unspecified: Secondary | ICD-10-CM

## 2017-10-22 DIAGNOSIS — I2510 Atherosclerotic heart disease of native coronary artery without angina pectoris: Secondary | ICD-10-CM

## 2017-10-22 DIAGNOSIS — I2583 Coronary atherosclerosis due to lipid rich plaque: Secondary | ICD-10-CM

## 2017-10-22 DIAGNOSIS — E785 Hyperlipidemia, unspecified: Secondary | ICD-10-CM | POA: Diagnosis not present

## 2017-10-22 DIAGNOSIS — R69 Illness, unspecified: Secondary | ICD-10-CM | POA: Diagnosis not present

## 2017-10-22 DIAGNOSIS — R195 Other fecal abnormalities: Secondary | ICD-10-CM

## 2017-10-22 DIAGNOSIS — R2681 Unsteadiness on feet: Secondary | ICD-10-CM

## 2017-10-22 DIAGNOSIS — F17200 Nicotine dependence, unspecified, uncomplicated: Secondary | ICD-10-CM

## 2017-10-22 DIAGNOSIS — I69251 Hemiplegia and hemiparesis following other nontraumatic intracranial hemorrhage affecting right dominant side: Secondary | ICD-10-CM | POA: Diagnosis not present

## 2017-10-22 MED ORDER — IRBESARTAN 75 MG PO TABS: 75.0000 mg | ORAL_TABLET | Freq: Every day | ORAL | 3 refills | Status: AC

## 2017-10-22 MED ORDER — VITAMIN D3 50 MCG (2000 UT) PO CAPS: 2000.0000 [IU] | ORAL_CAPSULE | Freq: Every day | ORAL | 3 refills | Status: AC

## 2017-10-22 MED ORDER — B COMPLEX PO TABS: 1.0000 | ORAL_TABLET | Freq: Every day | ORAL | 3 refills | Status: AC

## 2017-10-22 NOTE — Assessment & Plan Note (Addendum)
Irbesartan re-start Metoprolol

## 2017-10-22 NOTE — Assessment & Plan Note (Signed)
Off Lipitor for now F/u w/Dr [place] EmmsNishan

## 2017-10-22 NOTE — Assessment & Plan Note (Signed)
Irbesartan

## 2017-10-22 NOTE — Assessment & Plan Note (Signed)
Irbesartan re-start Metoprolol 

## 2017-10-22 NOTE — Assessment & Plan Note (Signed)
B complex

## 2017-10-22 NOTE — Patient Instructions (Signed)
Mini Squat: Double Leg    Use hands on walker for safety. With feet shoulder width apart, reach forward for balance and do a mini squat. Keep knees in line with second toe. Knees do not go past toes. MAKE SURE YOU HAVE EQUAL WEIGHT THROUGH BOTH LEGS.  Repeat __10_ times per set. Rest _30-60__ seconds after set. Do _3__ sets per session. Perform 3 times per week.  http://plyo.exer.us/70   Copyright  VHI. All rights reserved.   Bridge    Lie back, legs bent. Tuck in stomach and then lift hips up towards ceiling and hold for 2 seconds. Then lower hips back down.  Repeat __10__ times. Perform 3 sets. Do __3__ sessions per week.   http://pm.exer.us/54   Copyright  VHI. All rights reserved.   Toe / Heel Raise (Sitting)    With RIGHT FOOT only, lift toes and hold for 2 seconds and then lower. Next, lift heel and hold for 2 seconds and then lower. Repeat __20__ times. Perform every day.  Copyright  VHI. All rights reserved.

## 2017-10-22 NOTE — Assessment & Plan Note (Addendum)
In OP PT

## 2017-10-22 NOTE — Therapy (Signed)
[facility] [address] [place], [place], [address] Phone: [phone]   Fax:  [phone]  Physical Therapy Treatment  Patient Details  Name: Jaime Pierce MRN: [id number] Date of Birth: May 04, 1946 Referring Provider: Ranelle Oyster, MD   Encounter Date: 10/22/2017  PT End of Session - 10/22/17 1419    Visit Number  3    Number of Visits  25    Date for PT Re-Evaluation  12/18/17    Authorization Type  Aetna Medicare - G code and PN every 10th visit    PT Start Time  1319 pt late    PT Stop Time  1403    PT Time Calculation (min)  44 min    Equipment Utilized During Treatment  Gait belt    Activity Tolerance  Patient tolerated treatment well    Behavior During Therapy  [facility] for tasks assessed/performed       Past Medical History:  Diagnosis Date  . Coronary artery disease   . Dyspnea on exertion   . Hyperlipidemia   . Hypertension   . Tobacco abuse     Past Surgical History:  Procedure Laterality Date  . CORONARY ARTERY BYPASS GRAFT      There were no vitals filed for this visit.  Subjective Assessment - 10/22/17 1327    Subjective  Pt denied falls since last visit. Pt did take a two hour nap after therapy yesterday 2/2 fatigue. Pt amb. back to PT gym with RW and R h/o.    Patient is accompained by:  Family member [name]-wife    Pertinent History  CAD with CABG, DOE, hyperlipidemia, HTN and tobacco abuse    Patient Stated Goals  To be able to do the things he did before: mow with his tractor, get into and out of the boat for fishing!    Currently in Pain?  No/denies          Therex: Pt performed strengthening HEP with cues and demo for technique. Min A required during bridges to decr. RLE abduction. Please see pt instructions for HEP details.      10/22/17 0001  Ambulation/Gait  Ambulation/Gait Yes  Ambulation/Gait Assistance 4: Min guard  Ambulation/Gait Assistance Details Pt amb.  back to gym with RW and R h/o. Cues to improve RLE advancement and heel strike. Included in neuro re-ed.  Ambulation Distance (Feet) 75 Feet  Assistive device Rolling walker (with R h/o)  Gait Pattern Step-to pattern;Decreased step length - right;Decreased stride length;Decreased hip/knee flexion - right;Decreased dorsiflexion - right;Decreased trunk rotation;Narrow base of support;Poor foot clearance - right  Ambulation Surface Level;Indoor              Balance Exercises - 10/22/17 1411      Balance Exercises: Standing   SLS  Eyes open;Upper extremity support 2;2 reps;10 secs    Balance Master: Limits for Stability  Lateral weight shifting with RW and BUE support x10 reps to improve R lateral weight shifting. with 2" block under LLE to facilitate improved RLEwt. shift     Other Standing Exercises  Performed with min A to improve lateral weight shifting during weight shifting. Mirror used for feedback. Pt also performed mini squats with BUE support on RW and mirror for feedback 3x10 reps, with 2" block under LLE to improve wt. shift to the RLE. Cues for improved wt. shifting and upright posture.        PT Education - 10/22/17 1419  Education provided  Yes    Education Details  PT provided pt with strengthening HEP.    Person(s) Educated  Patient;Spouse    Methods  Explanation;Demonstration;Tactile cues;Verbal cues;Handout    Comprehension  Returned demonstration;Verbalized understanding;Need further instruction       PT Short Term Goals - 10/21/17 1205      PT SHORT TERM GOAL #1   Title  Pt will participate in further assessment of balance with TUG and five time sit to stand with RW    Time  4    Period  Weeks    Status  Achieved      PT SHORT TERM GOAL #2   Title  Pt will perform sit <> stand and stand pivot transfers with LRAD and supervision with <10% cues for safety/sequencing    Baseline  min A with 50% cues    Time  4    Period  Weeks    Status  New      PT  SHORT TERM GOAL #3   Title  Pt will ambulate x 250' on level, indoor surfaces (to/from waiting area) and will negotiate short ramp to simulate home entry/exit with LRAD and min A     Baseline  165' with RW min A, w/c for majority of mobility and to enter/exit home on ramp    Time  4    Period  Weeks    Status  New      PT SHORT TERM GOAL #4   Title  Pt will improve gait velocity to > or = 1.0 ft/sec    Baseline  .36 ft/sec with RW    Time  4    Period  Weeks    Status  New      PT SHORT TERM GOAL #5   Title  Pt will initiate gait training with cane or lesser AD over level, indoor surfaces with therapy    Time  4    Period  Weeks    Status  New        PT Long Term Goals - 10/21/17 1205      PT LONG TERM GOAL #1   Title  Pt and wife will demonstrate independence with HEP    Time  8    Period  Weeks    Status  New      PT LONG TERM GOAL #2   Title  Pt will decrease TUG time by  seconds with LRAD to </=47 sec. to decr. falls risk.    Baseline  62.62 sec. with RW and R hand orthosis on RW    Time  8    Period  Weeks    Status  New      PT LONG TERM GOAL #3   Title  Pt will improve LE strength as indicated by decrease in five time sit to stand to <12 sec.    Baseline  TBD    Time  8    Period  Weeks    Status  New      PT LONG TERM GOAL #4   Title  Pt will perform bed mobility and all transfers with LRAD MOD I    Time  8    Period  Weeks    Status  New      PT LONG TERM GOAL #5   Title  Pt will ambulate >300 over indoor and paved outdoor surfaces and negotiate ramp with LRAD and supervision for more independent household and  community mobility    Time  8    Period  Weeks    Status  New      PT LONG TERM GOAL #6   Title  Pt will improve gait velocity to > or = 1.548ft/sec with LRAD    Time  8    Period  Weeks    Status  New      PT LONG TERM GOAL #7   Title  Pt will improve overall function on FOTO by 25 points    Baseline  28%    Time  8    Period  Weeks     Status  New            Plan - 10/22/17 1419    Clinical Impression Statement  Today's skilled session focused on encouraging incr. lateral weight shifting onto RLE during functional activities. Pt required rest breaks after each activity 2/2 fatigue. Pt required min A during bridges to decr. R LE abduction, pt's wife will assist with this activity at home. Continue with POC.     Rehab Potential  Good    PT Frequency  3x / week    PT Duration  8 weeks    PT Treatment/Interventions  ADLs/Self Care Home Management;Electrical Stimulation;DME Instruction;Gait training;Stair training;Functional mobility training;Therapeutic activities;Therapeutic exercise;Balance training;Neuromuscular re-education;Cognitive remediation;Patient/family education;Orthotic Fit/Training;Passive range of motion;Energy conservation;Taping;Manual techniques    PT Next Visit Plan  Review HEP as needed, sit <> stand training from low surfaces, R NMR    PT Home Exercise Plan  pt goals include mowing with his tractor and fishing on his boat - incorporate into balance activities    Consulted and Agree with Plan of Care  Patient;Family member/caregiver    Family Member Consulted  wife       Patient will benefit from skilled therapeutic intervention in order to improve the following deficits and impairments:  Abnormal gait, Cardiopulmonary status limiting activity, Decreased balance, Decreased cognition, Decreased endurance, Decreased mobility, Decreased strength, Difficulty walking, Increased muscle spasms, Impaired sensation, Impaired tone, Impaired UE functional use, Postural dysfunction, Pain, Decreased range of motion, Increased edema  Visit Diagnosis: Hemiplegia and hemiparesis following cerebral infarction affecting right dominant side (HCC)  Unsteadiness on feet  Other lack of coordination     Problem List Patient Active Problem List   Diagnosis Date Noted  . Spastic hemiparesis (HCC)   . Cough   .  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 Pain   . Hyponatremia   . Benign essential HTN   . Intraparenchymal hemorrhage of brain (HCC) 09/09/2017  . 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 Gait disturbance, post-stroke   . Aphasia as late effect of stroke   . SAH (subarachnoid hemorrhage) (HCC) 09/02/2017  . B12 deficiency 09/02/2017  . Well adult exam 06/16/2016  . Cigarette smoker 02/23/2016  . COPD GOLD II  12/24/2014  . CAD (coronary artery disease) 07/22/2011  . HTN (hypertension) 07/22/2011  . Dyslipidemia 07/22/2011  . Smoker 07/22/2011    Kesley Gaffey L 10/22/2017, 2:30 PM  [facility] [address] [place], [place], [address] Phone: [phone]   Fax:  [phone]  Name: Delora FuelRichard A Wetherell MRN: [id number] Date of Birth: 1946/09/03  Zerita BoersJennifer Maleya Leever, PT,DPT 10/22/17 2:31 PM Phone: [phone] Fax: [phone]

## 2017-10-22 NOTE — Assessment & Plan Note (Signed)
GI ref 

## 2017-10-22 NOTE — Progress Notes (Signed)
Subjective:  Patient ID: Jaime Pierce, male    DOB: 1946/04/15  Age: 71 y.o. MRN: [id number]  CC: No chief complaint on file.   HPI Jaime FuelRichard A Zhen presents for post-hosp visit following his hemorrhage/CVA in Oct 2018. F/u CAD, dyslipidemia, COPD  "DISCHARGE DIAGNOSES: 1. Left parietal intraparenchymal hemorrhage, likely due to     hypertension and small vessel disease. 2. Sequential compression devices for deep vein thrombosis     prophylaxis. 3. Pain management. 4. Coronary artery disease with coronary artery bypass grafting. 5. Hypertension. 6. Chronic obstructive pulmonary disease with tobacco abuse. 7. Hyponatremia, resolved. 8. Mild leukocytosis, resolved. 9. Right knee pain with edema."    Outpatient Medications Prior to Visit  Medication Sig Dispense Refill  . albuterol (PROAIR HFA) 108 (90 Base) MCG/ACT inhaler 2 puffs up to every 4 hours if can't catch your breath (Patient taking differently: Inhale 1-2 puffs into the lungs every 4 (four) hours as needed for wheezing or shortness of breath. ) 1 Inhaler 11  . baclofen (LIORESAL) 20 MG tablet Take 1 tablet (20 mg total) by mouth 3 (three) times daily. 90 each 0  . budesonide-formoterol (SYMBICORT) 160-4.5 MCG/ACT inhaler Take 2 puffs first thing in am and then another 2 puffs about 12 hours later. (Patient taking differently: Inhale 2 puffs into the lungs 2 (two) times daily. ) 1 Inhaler 0  . diclofenac sodium (VOLTAREN) 1 % GEL Apply 2 g topically 4 (four) times daily as needed (Pain). 3 Tube 0  . metoprolol tartrate (LOPRESSOR) 25 MG tablet Take 0.5 tablets (12.5 mg total) by mouth 2 (two) times daily. 30 tablet 0  . nitroGLYCERIN (NITROSTAT) 0.4 MG SL tablet Place 1 tablet (0.4 mg total) under the tongue every 5 (five) minutes as needed for chest pain. 20 tablet 3  . vitamin B-12 1000 MCG tablet Take 1 tablet (1,000 mcg total) by mouth daily. 90 tablet 0  . cetirizine (ZYRTEC) 10 MG tablet Take 10 mg by mouth  daily.    . hydrocortisone 1 % ointment Apply topically 3 (three) times daily as needed for itching. 30 g 0  . nystatin (MYCOSTATIN/NYSTOP) powder Apply topically 3 (three) times daily. To affected areas 60 g 0  . pantoprazole (PROTONIX) 40 MG tablet Take 1 tablet (40 mg total) by mouth daily. 30 tablet 0   No facility-administered medications prior to visit.     ROS Review of Systems  Constitutional: Negative for appetite change, fatigue and unexpected weight change.  HENT: Negative for congestion, nosebleeds, sneezing, sore throat and trouble swallowing.   Eyes: Negative for itching and visual disturbance.  Respiratory: Negative for cough.   Cardiovascular: Negative for chest pain, palpitations and leg swelling.  Gastrointestinal: Negative for abdominal distention, blood in stool, diarrhea and nausea.  Genitourinary: Negative for frequency and hematuria.  Musculoskeletal: Positive for gait problem. Negative for back pain, joint swelling and neck pain.  Skin: Negative for rash.  Neurological: Positive for weakness. Negative for dizziness, tremors and speech difficulty.  Psychiatric/Behavioral: Negative for agitation, dysphoric mood and sleep disturbance. The patient is not nervous/anxious.     Objective:  BP (!) 142/80 (BP Location: Left Arm, Patient Position: Sitting, Cuff Size: Normal)   Pulse 86   Temp 98.2 F (36.8 C) (Oral)   Ht 6\' 1"  (1.854 m)   Wt 172 lb (78 kg)   SpO2 97%   BMI 22.69 kg/m   BP Readings from Last 3 Encounters:  10/22/17 (!) 142/80  10/21/17 [name] Kitchen(!)  150/84  10/19/17 (!) 149/79    Wt Readings from Last 3 Encounters:  10/22/17 172 lb (78 kg)  10/06/17 187 lb 6.3 oz (85 kg)  09/01/17 187 lb 9.8 oz (85.1 kg)    Physical Exam  Constitutional: He is oriented to person, place, and time. He appears well-developed. No distress.  NAD  HENT:  Mouth/Throat: Oropharynx is clear and moist.  Eyes: Conjunctivae are normal. Pupils are equal, round, and reactive  to light.  Neck: Normal range of motion. No JVD present. No thyromegaly present.  Cardiovascular: Normal rate, regular rhythm, normal heart sounds and intact distal pulses. Exam reveals no gallop and no friction rub.  No murmur heard. Pulmonary/Chest: Effort normal and breath sounds normal. No respiratory distress. He has no wheezes. He has no rales. He exhibits no tenderness.  Abdominal: Soft. Bowel sounds are normal. He exhibits no distension and no mass. There is no tenderness. There is no rebound and no guarding.  Musculoskeletal: Normal range of motion. He exhibits no edema or tenderness.  Lymphadenopathy:    He has no cervical adenopathy.  Neurological: He is alert and oriented to person, place, and time. He has normal reflexes. No cranial nerve deficit. He exhibits normal muscle tone. He displays a negative Romberg sign. Coordination abnormal. Gait normal.  Skin: Skin is warm and dry. No rash noted.  Psychiatric: He has a normal mood and affect. His behavior is normal. Judgment and thought content normal.  R hemiparesis walker  Lab Results  Component Value Date   WBC 6.7 10/13/2017   HGB 12.6 (L) 10/13/2017   HCT 38.6 (L) 10/13/2017   PLT 281 10/13/2017   GLUCOSE 102 (H) 10/13/2017   CHOL 131 09/02/2017   TRIG 73 09/02/2017   HDL 35 (L) 09/02/2017   LDLCALC 81 09/02/2017   ALT 61 09/09/2017   AST 39 09/09/2017   NA 138 10/13/2017   K 3.7 10/13/2017   CL 105 10/13/2017   CREATININE 0.79 10/13/2017   BUN 17 10/13/2017   CO2 25 10/13/2017   TSH 1.753 09/02/2017   PSA 1.25 07/01/2017   INR 1.02 09/01/2017   HGBA1C 5.9 (H) 09/02/2017    No results found.  Assessment & Plan:   There are no diagnoses linked to this encounter. I have discontinued Jolly A. Otwell's cetirizine, hydrocortisone, nystatin, and pantoprazole. I am also having him maintain his albuterol, budesonide-formoterol, nitroGLYCERIN, cyanocobalamin, baclofen, diclofenac sodium, and metoprolol  tartrate.  No orders of the defined types were placed in this encounter.    Follow-up: No Follow-up on file.  Sonda PrimesAlex Millicent Blazejewski, MD

## 2017-10-22 NOTE — Assessment & Plan Note (Signed)
Quit Oct 2018 

## 2017-10-23 ENCOUNTER — Emergency Department (HOSPITAL_COMMUNITY)
Admission: EM | Admit: 2017-10-23 | Discharge: 2017-10-24 | Disposition: A | Discharge status: Home / Self Care | Payer: Medicare HMO | Attending: Emergency Medicine | Admitting: Emergency Medicine

## 2017-10-23 ENCOUNTER — Emergency Department (HOSPITAL_COMMUNITY): Payer: Medicare HMO

## 2017-10-23 ENCOUNTER — Other Ambulatory Visit: Payer: Self-pay

## 2017-10-23 DIAGNOSIS — G459 Transient cerebral ischemic attack, unspecified: Secondary | ICD-10-CM | POA: Diagnosis not present

## 2017-10-23 DIAGNOSIS — J449 Chronic obstructive pulmonary disease, unspecified: Secondary | ICD-10-CM | POA: Insufficient documentation

## 2017-10-23 DIAGNOSIS — Z79899 Other long term (current) drug therapy: Secondary | ICD-10-CM | POA: Insufficient documentation

## 2017-10-23 DIAGNOSIS — R569 Unspecified convulsions: Secondary | ICD-10-CM | POA: Insufficient documentation

## 2017-10-23 DIAGNOSIS — I2510 Atherosclerotic heart disease of native coronary artery without angina pectoris: Secondary | ICD-10-CM | POA: Insufficient documentation

## 2017-10-23 DIAGNOSIS — R0902 Hypoxemia: Secondary | ICD-10-CM | POA: Diagnosis not present

## 2017-10-23 DIAGNOSIS — M62838 Other muscle spasm: Secondary | ICD-10-CM | POA: Diagnosis present

## 2017-10-23 DIAGNOSIS — I10 Essential (primary) hypertension: Secondary | ICD-10-CM | POA: Insufficient documentation

## 2017-10-23 DIAGNOSIS — R69 Illness, unspecified: Secondary | ICD-10-CM | POA: Diagnosis not present

## 2017-10-23 DIAGNOSIS — E785 Hyperlipidemia, unspecified: Secondary | ICD-10-CM | POA: Insufficient documentation

## 2017-10-23 DIAGNOSIS — F17210 Nicotine dependence, cigarettes, uncomplicated: Secondary | ICD-10-CM | POA: Diagnosis not present

## 2017-10-23 LAB — COMPREHENSIVE METABOLIC PANEL
ALK PHOS: 57 U/L (ref 38–126)
ALT: 16 U/L — ABNORMAL LOW (ref 17–63)
ANION GAP: 9 (ref 5–15)
AST: 16 U/L (ref 15–41)
Albumin: 3.2 g/dL — ABNORMAL LOW (ref 3.5–5.0)
BILIRUBIN TOTAL: 0.9 mg/dL (ref 0.3–1.2)
BUN: 12 mg/dL (ref 6–20)
CALCIUM: 9 mg/dL (ref 8.9–10.3)
CO2: 22 mmol/L (ref 22–32)
Chloride: 107 mmol/L (ref 101–111)
Creatinine, Ser: 0.69 mg/dL (ref 0.61–1.24)
Glucose, Bld: 105 mg/dL — ABNORMAL HIGH (ref 65–99)
Potassium: 3.5 mmol/L (ref 3.5–5.1)
Sodium: 138 mmol/L (ref 135–145)
TOTAL PROTEIN: 6.2 g/dL — AB (ref 6.5–8.1)

## 2017-10-23 LAB — CBC WITH DIFFERENTIAL/PLATELET
Basophils Absolute: 0.1 10*3/uL (ref 0.0–0.1)
Basophils Relative: 1 %
Eosinophils Absolute: 0.1 10*3/uL (ref 0.0–0.7)
Eosinophils Relative: 1 %
HEMATOCRIT: 39.3 % (ref 39.0–52.0)
HEMOGLOBIN: 13.2 g/dL (ref 13.0–17.0)
LYMPHS ABS: 2.2 10*3/uL (ref 0.7–4.0)
Lymphocytes Relative: 23 %
MCH: 31.1 pg (ref 26.0–34.0)
MCHC: 33.6 g/dL (ref 30.0–36.0)
MCV: 92.5 fL (ref 78.0–100.0)
MONO ABS: 1 10*3/uL (ref 0.1–1.0)
MONOS PCT: 11 %
NEUTROS PCT: 64 %
Neutro Abs: 5.9 10*3/uL (ref 1.7–7.7)
Platelets: 253 10*3/uL (ref 150–400)
RBC: 4.25 MIL/uL (ref 4.22–5.81)
RDW: 14.2 % (ref 11.5–15.5)
WBC: 9.3 10*3/uL (ref 4.0–10.5)

## 2017-10-23 MED ORDER — METOPROLOL TARTRATE 25 MG PO TABS
12.5000 mg | ORAL_TABLET | Freq: Once | ORAL | Status: AC
  Administered 2017-10-23: 12.5 mg via ORAL
  Filled 2017-10-23: qty 1

## 2017-10-23 MED ORDER — LEVETIRACETAM 500 MG PO TABS
1000.0000 mg | ORAL_TABLET | Freq: Once | ORAL | Status: AC
Start: 2017-10-23 — End: 2017-10-23
  Administered 2017-10-23: 1000 mg via ORAL
  Filled 2017-10-23: qty 2

## 2017-10-23 MED ORDER — BACLOFEN 20 MG PO TABS
20.0000 mg | ORAL_TABLET | Freq: Once | ORAL | Status: AC
  Administered 2017-10-23: 20 mg via ORAL
  Filled 2017-10-23 (×2): qty 1

## 2017-10-23 MED ORDER — LORAZEPAM 2 MG/ML IJ SOLN: 0.5000 mg | Freq: Once | INTRAMUSCULAR | Status: DC

## 2017-10-23 MED ORDER — LEVETIRACETAM 500 MG PO TABS: 500.0000 mg | ORAL_TABLET | Freq: Two times a day (BID) | ORAL | 0 refills | Status: DC

## 2017-10-23 MED ORDER — SODIUM CHLORIDE 0.9 % IV SOLN
1000.0000 mg | Freq: Once | INTRAVENOUS | Status: DC
  Filled 2017-10-23: qty 10

## 2017-10-23 MED ORDER — ASPIRIN 81 MG PO CHEW
324.0000 mg | CHEWABLE_TABLET | Freq: Once | ORAL | Status: DC
  Filled 2017-10-23: qty 4

## 2017-10-23 NOTE — ED Notes (Signed)
Pt admitted to EMT that he was engaged in sexual activity while the spasms began. Pt does not seem to be comfortable discussing his sexual activity with family or male staff present.

## 2017-10-23 NOTE — ED Notes (Signed)
Pt transported to CT ?

## 2017-10-23 NOTE — ED Provider Notes (Signed)
[facility] EMERGENCY DEPARTMENT Provider Note   CSN: [id number] Arrival date & time: 10/23/17  1829     History   Chief Complaint Chief Complaint  Patient presents with  . Spasms    HPI Jaime Pierce is a 71 y.o. male.  HPI Patient really recently diagnosed with intraparenchymal hemorrhagic stroke affecting his right side.  Presents with 3 witnessed episodes of "spasms" of his right upper and right lower extremities as well as the right side of his face.  Each episode lasted roughly 2 minutes.  Patient did not lose consciousness.  No incontinence.  No previously similar symptoms.  Continues to have right upper greater than right lower extremity weakness.  No headache or visual changes. Past Medical History:  Diagnosis Date  . Coronary artery disease   . Dyspnea on exertion   . Hyperlipidemia   . Hypertension   . Tobacco abuse     Patient Active Problem List   Diagnosis Date Noted  . Positive colorectal cancer screening using Cologuard test 10/22/2017  . Spastic hemiparesis (HCC)   . Cough   . 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 Pain   . Hyponatremia   . Benign essential HTN   . 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 Gait disturbance, post-stroke   . Aphasia as late effect of stroke   . SAH (subarachnoid hemorrhage) (HCC) 09/02/2017  . B12 deficiency 09/02/2017  . Well adult exam 06/16/2016  . Cigarette smoker 02/23/2016  . COPD GOLD II  12/24/2014  . CAD (coronary artery disease) 07/22/2011  . HTN (hypertension) 07/22/2011  . Dyslipidemia 07/22/2011  . Smoker 07/22/2011    Past Surgical History:  Procedure Laterality Date  . CORONARY ARTERY BYPASS GRAFT         Home Medications    Prior to Admission medications   Medication Sig Start Date End Date Taking? Authorizing Provider  albuterol (PROAIR HFA) 108 (90 Base) MCG/ACT inhaler 2 puffs up to  every 4 hours if can't catch your breath Patient taking differently: Inhale 1-2 puffs into the lungs every 4 (four) hours as needed for wheezing or shortness of breath.  03/25/17  Yes Nyoka Cowden, MD  b complex vitamins tablet Take 1 tablet by mouth daily. 10/22/17  Yes Plotnikov, Georgina Quint, MD  baclofen (LIORESAL) 20 MG tablet Take 1 tablet (20 mg total) by mouth 3 (three) times daily. 10/15/17  Yes Love, Evlyn Kanner, PA-C  budesonide-formoterol (SYMBICORT) 160-4.5 MCG/ACT inhaler Take 2 puffs first thing in am and then another 2 puffs about 12 hours later. Patient taking differently: Inhale 2 puffs into the lungs 2 (two) times daily.  03/25/17  Yes Nyoka Cowden, MD  Cholecalciferol (VITAMIN D3) 2000 units capsule Take 1 capsule (2,000 Units total) by mouth daily. 10/22/17  Yes Plotnikov, Georgina Quint, MD  diclofenac sodium (VOLTAREN) 1 % GEL Apply 2 g topically 4 (four) times daily as needed (Pain). 10/15/17  Yes Love, Evlyn Kanner, PA-C  metoprolol tartrate (LOPRESSOR) 25 MG tablet Take 0.5 tablets (12.5 mg total) by mouth 2 (two) times daily. 10/15/17  Yes Love, Evlyn Kanner, PA-C  nitroGLYCERIN (NITROSTAT) 0.4 MG SL tablet Place 1 tablet (0.4 mg total) under the tongue every 5 (five) minutes as needed for chest pain. 06/30/17 10/24/21 Yes Plotnikov, Georgina Quint, MD  irbesartan (AVAPRO) 75 MG tablet Take 1 tablet (75 mg total) by mouth daily. 10/22/17  Plotnikov, Georgina QuintAleksei V, MD  levETIRAcetam (KEPPRA) 500 MG tablet Take 1 tablet (500 mg total) by mouth 2 (two) times daily. 10/23/17   Loren RacerYelverton, Angelisse Riso, MD    Family History Family History  Problem Relation Age of Onset  . Heart disease Father   . CVA Mother   . Heart disease Maternal Grandfather     Social History Social History   Tobacco Use  . Smoking status: Current Every Day Smoker    Packs/day: 0.50    Years: 50.00    Pack years: 25.00    Types: Cigarettes  . Smokeless tobacco: Never Used  Substance Use Topics  . Alcohol use: Yes     Alcohol/week: 0.0 oz    Comment: occasional  . Drug use: No     Allergies   Sulfonamide derivatives   Review of Systems Review of Systems  Constitutional: Negative for chills and fever.  Eyes: Negative for visual disturbance.  Respiratory: Negative for cough and shortness of breath.   Cardiovascular: Negative for chest pain.  Gastrointestinal: Negative for abdominal pain, diarrhea, nausea and vomiting.  Musculoskeletal: Negative for back pain, myalgias and neck pain.  Skin: Negative for rash and wound.  Neurological: Positive for tremors and weakness. Negative for syncope, numbness and headaches.  All other systems reviewed and are negative.    Physical Exam Updated Vital Signs BP (!) 148/78 (BP Location: Left Arm)   Pulse 68   Temp 98.3 F (36.8 C) (Oral)   Resp 19   Ht 6\' 1"  (1.854 m)   Wt 78.5 kg (173 lb)   SpO2 93%   BMI 22.82 kg/m   Physical Exam  Constitutional: He is oriented to person, place, and time. He appears well-developed and well-nourished. No distress.  HENT:  Head: Normocephalic and atraumatic.  Mouth/Throat: Oropharynx is clear and moist. No oropharyngeal exudate.  Eyes: EOM are normal. Pupils are equal, round, and reactive to light.  Neck: Normal range of motion. Neck supple.  Cardiovascular: Normal rate and regular rhythm.  Pulmonary/Chest: Effort normal and breath sounds normal. No stridor. No respiratory distress. He has no wheezes. He has no rales. He exhibits no tenderness.  Abdominal: Soft. Bowel sounds are normal. There is no tenderness. There is no rebound and no guarding.  Musculoskeletal: Normal range of motion. He exhibits no edema or tenderness.  Lymphadenopathy:    He has no cervical adenopathy.  Neurological: He is alert and oriented to person, place, and time.  2/5 motor in right upper extremity.  4/5 motor in right lower extremity.  Small amount of right sided lower facial droop.  5/5 motor in left upper and left lower extremity  with sensation intact.  Skin: Skin is warm and dry. No rash noted. No erythema.  Psychiatric: He has a normal mood and affect. His behavior is normal.  Nursing note and vitals reviewed.    ED Treatments / Results  Labs (all labs ordered are listed, but only abnormal results are displayed) Labs Reviewed  COMPREHENSIVE METABOLIC PANEL - Abnormal; Notable for the following components:      Result Value   Glucose, Bld 105 (*)    Total Protein 6.2 (*)    Albumin 3.2 (*)    ALT 16 (*)    All other components within normal limits  CBC WITH DIFFERENTIAL/PLATELET    EKG  EKG Interpretation  Date/Time:  Saturday October 23 2017 20:14:42 EST Ventricular Rate:  70 PR Interval:    QRS Duration: 98 QT Interval:  444  QTC Calculation: 480 R Axis:   109 Text Interpretation:  Sinus rhythm Right axis deviation Borderline repolarization abnormality Borderline prolonged QT interval Confirmed by Geoffery LyonseLo, Douglas ([id number]) on 10/25/2017 10:49:55 AM       Radiology No results found.  Procedures Procedures (including critical care time)  Medications Ordered in ED Medications  baclofen (LIORESAL) tablet 20 mg (20 mg Oral Given 10/23/17 2352)  metoprolol tartrate (LOPRESSOR) tablet 12.5 mg (12.5 mg Oral Given 10/23/17 2236)  levETIRAcetam (KEPPRA) tablet 1,000 mg (1,000 mg Oral Given 10/23/17 2352)     Initial Impression / Assessment and Plan / ED Course  I have reviewed the triage vital signs and the nursing notes.  Pertinent labs & imaging results that were available during my care of the patient were reviewed by me and considered in my medical decision making (see chart for details).     Suspect partial seizures.  Will repeat CT head to reassess hemorrhage and will discuss with neurology. Discussed with Dr. Amada JupiterKirkpatrick.  Recommends Keppra loading and starting Keppra 500 mg twice daily.  Patient is refusing IV loading.  We will give oral loading dose.  Advise close follow-up with neurology  and return precautions given. Final Clinical Impressions(s) / ED Diagnoses   Final diagnoses:  Partial seizure [facility](HCC)    ED Discharge Orders        Ordered    levETIRAcetam (KEPPRA) 500 MG tablet  2 times daily     10/23/17 2324       Loren RacerYelverton, Vietta Bonifield, MD 10/26/17 1756

## 2017-10-23 NOTE — ED Triage Notes (Signed)
Pt had a stroke in October with right-sided deficits. Today Pt has experienced 3 different right-sided muscle spasms over 45 minute period lasting approx 3 minutes each. Spasms occurred in right arm, right leg, and right side of face. Spasms started approx 4:45pm today.

## 2017-10-25 DIAGNOSIS — R2681 Unsteadiness on feet: Secondary | ICD-10-CM | POA: Diagnosis present

## 2017-10-25 DIAGNOSIS — M25561 Pain in right knee: Secondary | ICD-10-CM | POA: Insufficient documentation

## 2017-10-25 DIAGNOSIS — M6281 Muscle weakness (generalized): Secondary | ICD-10-CM | POA: Insufficient documentation

## 2017-10-25 DIAGNOSIS — R278 Other lack of coordination: Secondary | ICD-10-CM | POA: Diagnosis present

## 2017-10-25 DIAGNOSIS — R41841 Cognitive communication deficit: Secondary | ICD-10-CM | POA: Diagnosis present

## 2017-10-25 DIAGNOSIS — R2689 Other abnormalities of gait and mobility: Secondary | ICD-10-CM | POA: Diagnosis present

## 2017-10-25 DIAGNOSIS — I69351 Hemiplegia and hemiparesis following cerebral infarction affecting right dominant side: Secondary | ICD-10-CM | POA: Diagnosis present

## 2017-10-25 DIAGNOSIS — I612 Nontraumatic intracerebral hemorrhage in hemisphere, unspecified: Secondary | ICD-10-CM | POA: Diagnosis present

## 2017-10-25 DIAGNOSIS — I69251 Hemiplegia and hemiparesis following other nontraumatic intracranial hemorrhage affecting right dominant side: Secondary | ICD-10-CM | POA: Insufficient documentation

## 2017-10-25 DIAGNOSIS — I69218 Other symptoms and signs involving cognitive functions following other nontraumatic intracranial hemorrhage: Secondary | ICD-10-CM | POA: Insufficient documentation

## 2017-10-25 DIAGNOSIS — M25511 Pain in right shoulder: Secondary | ICD-10-CM | POA: Insufficient documentation

## 2017-10-25 DIAGNOSIS — R208 Other disturbances of skin sensation: Secondary | ICD-10-CM | POA: Insufficient documentation

## 2017-10-25 NOTE — Therapy (Signed)
[facility] [address] [place], [place], [address] Phone: [phone]   Fax:  [phone]  Speech Language Pathology Evaluation  Patient Details  Name: Jaime Pierce MRN: [id number] Date of Birth: [birth year] Referring Provider: Faith Rogue, MD   Encounter Date: 10/22/2017  End of Session - 10/25/17 1111    Visit Number  1    Number of Visits  25    Date for SLP Re-Evaluation  12/31/17    SLP Start Time  1103    SLP Stop Time   1147    SLP Time Calculation (min)  44 min    Activity Tolerance  Patient tolerated treatment well       Past Medical History:  Diagnosis Date  . Coronary artery disease   . Dyspnea on exertion   . Hyperlipidemia   . Hypertension   . Tobacco abuse     Past Surgical History:  Procedure Laterality Date  . CORONARY ARTERY BYPASS GRAFT      There were no vitals filed for this visit.  Subjective Assessment - 10/25/17 1055    Subjective  Pt ST on CIR reports cognitive linguistic deficits.    Currently in Pain?  No/denies         SLP Evaluation [facility] - 10/25/17 1055      SLP Visit Information   SLP Received On  10/22/17    Referring Provider  Faith Rogue, MD    Onset Date  09-03-17    Medical Diagnosis  ICH      General Information   HPI  ST on CIR focused on cognitive-linguistics.      Prior Functional Status   Cognitive/Linguistic Baseline  Within functional limits    Type of Home  House     Lives With  Spouse    Vocation  Retired Biochemist, clinical       Cognition   Overall Cognitive Status  Impaired/Different from baseline    Area of Impairment  Problem solving;Awareness;Attention    Current Attention Level  Sustained;Selective working memory    Attention Comments  staring out window and at clock during conversation in evaluation; wife states pt has difficulty with selective attention; working memory - pt req'd occasional repeat of directions/eval stimuli  during evaluation - CIR report of working memory deficits    Awareness  Emergent    Awareness Comments  Pt unaware of errors of reasoning/problem solving    Problem Solving  Slow processing    Problem Solving Comments  pt unable to succinctly verbalize (vague answers, even with SLP rewording questions) why he would have difficulty with day trading (e.g., "I wouldn't trust myself.")    Behaviors  Restless mildly      Auditory Comprehension   Overall Auditory Comprehension  Other (comment) impaired vs working memory deficits      Verbal Expression   Overall Verbal Expression  Appears within functional limits for tasks assessed      Oral Motor/Sensory Function   Overall Oral Motor/Sensory Function  Appears within functional limits for tasks assessed      Motor Speech   Overall Motor Speech  Appears within functional limits for tasks assessed                      SLP Education - 10/25/17 1111    Education provided  Yes    Education Details  eval results    Person(s) Educated  Patient;Spouse    Methods  Explanation    Comprehension  Verbalized understanding       SLP Short Term Goals - 10/25/17 1116      SLP SHORT TERM GOAL #1   Title  pt will demo emergent awareness 90% of the time in mod complex cognitive linguistic tasks    Time  4    Period  Weeks    Status  New      SLP SHORT TERM GOAL #2   Title  pt will recall memory strategies (WARM) with modified independence over three sessions    Time  4    Period  Weeks    Status  New      SLP SHORT TERM GOAL #3   Title  pt will tell SLP three non-physical deficits over two sessions    Time  4    Period  Weeks    Status  New      SLP SHORT TERM GOAL #4   Title  pt will complete mod complex reasoning/problem solving tasks involving money/time/etc with 95% success over three sessions    Time  4    Period  Weeks    Status  New       SLP Long Term Goals - 10/25/17 1129      SLP LONG TERM GOAL #1   Title   pt will use 1 memory strategy in 5 therapy sessions    Time  8    Period  Weeks    Status  New      SLP LONG TERM GOAL #2   Title  pt will demo [facility] executive function skills when presented with problems in everyday tasks over three sessions, with SBA    Time  8    Period  Weeks    Status  New      SLP LONG TERM GOAL #3   Title  pt will demo simple divided attention with 90% success on both tasks over three sessions    Time  8    Period  Weeks    Status  New      SLP LONG TERM GOAL #4   Title  pt will use anticipatory awareness by double checking written cognitive linguistic tasks 100% of the time in 4 sessions    Time  8    Period  Weeks    Status  New         Patient will benefit from skilled therapeutic intervention in order to improve the following deficits and impairments:   Cognitive communication deficit  G-Codes - 10/22/17 1703    Functional Assessment Tool Used  NOMS ,clincal judgment    Functional Limitations  Other Speech Language Pathology problem solving/reasoning    Other Speech-Language Pathology Functional Limitation Current Status [phone]([id number])  At least 20 percent but less than 40 percent impaired, limited or restricted    Other Speech-Language Pathology Functional Limitation Goal Status ([id number])  At least 1 percent but less than 20 percent impaired, limited or restricted       Problem List Patient Active Problem List   Diagnosis Date Noted  . Positive colorectal cancer screening using Cologuard test 10/22/2017  . Spastic hemiparesis (HCC)   . Cough   . 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 Pain   . Hyponatremia   . Benign essential HTN   . 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 Gait disturbance, post-stroke   . Aphasia  as late effect of stroke   . SAH (subarachnoid hemorrhage) (HCC) 09/02/2017  . B12 deficiency 09/02/2017  . Well adult exam 06/16/2016  . Cigarette  smoker 02/23/2016  . COPD GOLD II  12/24/2014  . CAD (coronary artery disease) 07/22/2011  . HTN (hypertension) 07/22/2011  . Dyslipidemia 07/22/2011  . Smoker 07/22/2011    [facility] KatyCHINKE,Nikia Levels ,MS, CCC-SLP  10/25/2017, 11:36 AM  [facility] [address] [place], [place], [address] Phone: [phone]   Fax:  [phone]  Name: Jaime Pierce MRN: [id number] Date of Birth: May 22, 1946

## 2017-10-26 ENCOUNTER — Ambulatory Visit: Payer: Medicare HMO | Attending: Physical Medicine & Rehabilitation | Admitting: Occupational Therapy

## 2017-10-26 ENCOUNTER — Encounter: Payer: Self-pay | Admitting: Occupational Therapy

## 2017-10-26 DIAGNOSIS — I69218 Other symptoms and signs involving cognitive functions following other nontraumatic intracranial hemorrhage: Secondary | ICD-10-CM

## 2017-10-26 DIAGNOSIS — I69351 Hemiplegia and hemiparesis following cerebral infarction affecting right dominant side: Secondary | ICD-10-CM

## 2017-10-26 DIAGNOSIS — R208 Other disturbances of skin sensation: Secondary | ICD-10-CM

## 2017-10-26 DIAGNOSIS — R278 Other lack of coordination: Secondary | ICD-10-CM

## 2017-10-26 DIAGNOSIS — I69251 Hemiplegia and hemiparesis following other nontraumatic intracranial hemorrhage affecting right dominant side: Secondary | ICD-10-CM

## 2017-10-26 DIAGNOSIS — R2681 Unsteadiness on feet: Secondary | ICD-10-CM

## 2017-10-26 NOTE — Therapy (Signed)
[facility] [address] [place], [place], [address] Phone: [phone]   Fax:  [phone]  Occupational Therapy Treatment  Patient Details  Name: Jaime Pierce MRN: [id number] Date of Birth: 1946/04/03 Referring Provider: Dr. Dow Adolph   Encounter Date: 10/26/2017  OT End of Session - 10/26/17 1713    Visit Number  3    Number of Visits  24    Date for OT Re-Evaluation  12/18/17    Authorization Type  Aetna MCR - G code required    Authorization - Visit Number  3    Authorization - Number of Visits  10    OT Start Time  1530    OT Stop Time  1623    OT Time Calculation (min)  53 min    Activity Tolerance  Patient tolerated treatment well    Behavior During Therapy  [facility] for tasks assessed/performed       Past Medical History:  Diagnosis Date  . Coronary artery disease   . Dyspnea on exertion   . Hyperlipidemia   . Hypertension   . Tobacco abuse     Past Surgical History:  Procedure Laterality Date  . CORONARY ARTERY BYPASS GRAFT      There were no vitals filed for this visit.  Subjective Assessment - 10/26/17 1538    Subjective   Went to ED Saturday with seizure, no admission.  Haven't been able to do HEP due to ED visit and father passing away.    Patient is accompained by:  Family member    Pertinent History  ICH Lt parietal lobe 09/01/17. PMH: CAD, SAH, COPD, s/p CABG x 6    Patient Stated Goals  be independent again    Currently in Pain?  No/denies        In supine, AAROM chest press with BUEs with mod facilitation.    AAROM shoulder flex/elbow elbow ext with tilted stool with min facilitation, min-mod cueing.  Edema massage to R hand performed.    Supine>sit with min-mod facilitation for rolling to the R to facilitate normal movement patterns.         OT Education - 10/26/17 1647    Education Details  proper positioning of RUE in the bed (supine, lying on L side) and in  sitting; Reviewed HEP and pt returned demo each with min-mod cueing; Reviewed edema management    Person(s) Educated  Patient;Spouse    Methods  Explanation;Demonstration;Verbal cues    Comprehension  Verbalized understanding;Returned demonstration;Verbal cues required;Need further instruction       OT Short Term Goals - 10/21/17 1218      OT SHORT TERM GOAL #1   Title  Independent with initial HEP     Time  4    Period  Weeks    Status  On-going      OT SHORT TERM GOAL #2   Title  Pt/wife to verbalize understanding with edema management (including compression glove) and safety considerations w/ lack of sensation Rt hand    Time  4    Period  Weeks    Status  On-going      OT SHORT TERM GOAL #3   Title  Pt to demo 25 degrees shoulder flexion in prep for low level reaching RUE    Time  4    Period  Weeks    Status  New      OT SHORT TERM GOAL #4   Title  Pt  to consistently be mod I level for dressing and bathing with A/E and task modifications prn    Time  4    Period  Weeks    Status  New      OT SHORT TERM GOAL #5   Title  Pt to use Rt hand to feed self finger foods 50% of the time    Time  4    Period  Weeks    Status  New      OT SHORT TERM GOAL #6   Title  Pt to perform shower transfer with CGA only using DME prn    Time  4    Period  Weeks    Status  New        OT Long Term Goals - 10/19/17 1055      OT LONG TERM GOAL #1   Title  Pt to be independent with updated HEP     Time  8    Period  Weeks    Status  New    Target Date  12/18/17      OT LONG TERM GOAL #2   Title  Pt to demo shoulder flexion to 40 degrees or greater for consistent low level reaching    Time  8    Period  Weeks    Status  New      OT LONG TERM GOAL #3   Title  Pt to improve RUE functional use as evidenced by performing 10 blocks on Box & Blocks test    Time  8    Period  Weeks    Status  New      OT LONG TERM GOAL #4   Title  Pt to use Rt hand to write name at 75%  legibility or greater with A/E prn    Time  8    Period  Weeks    Status  New      OT LONG TERM GOAL #5   Title  Pt to use Rt hand for eating and grooming 75% of the time    Time  8    Period  Weeks    Status  New      Long Term Additional Goals   Additional Long Term Goals  Yes      OT LONG TERM GOAL #6   Title  Pt to return to simple cooking task with distant supervision and A/E prn    Time  8    Period  Weeks    Status  New            Plan - 10/26/17 1713    Clinical Impression Statement  Pt demo improved AAROM with cueing for compensation/movement.      Rehab Potential  Good    OT Frequency  2x / week    OT Duration  8 weeks    Plan  sidelying: RUE AA/ROM and scapula mobilization    Consulted and Agree with Plan of Care  Patient;Family member/caregiver    Family Member Consulted  wife       Patient will benefit from skilled therapeutic intervention in order to improve the following deficits and impairments:  Decreased coordination, Decreased range of motion, Increased edema, Impaired sensation, Decreased knowledge of precautions, Impaired tone, Decreased activity tolerance, Decreased balance, Decreased knowledge of use of DME, Impaired UE functional use, Pain, Decreased cognition, Decreased mobility, Decreased strength, Impaired vision/preception  Visit Diagnosis: Hemiplegia and hemiparesis following cerebral infarction affecting right dominant side (  HCC)  Unsteadiness on feet  Other lack of coordination  Other symptoms and signs involving cognitive functions following other nontraumatic intracranial hemorrhage  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)  Other disturbances of skin sensation    Problem List Patient Active Problem List   Diagnosis Date Noted  . Positive colorectal cancer screening using Cologuard test 10/22/2017  . Spastic hemiparesis (HCC)   . Cough   .  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 Pain   . Hyponatremia   . Benign essential HTN   . 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 Gait disturbance, post-stroke   . Aphasia as late effect of stroke   . SAH (subarachnoid hemorrhage) (HCC) 09/02/2017  . B12 deficiency 09/02/2017  . Well adult exam 06/16/2016  . Cigarette smoker 02/23/2016  . COPD GOLD II  12/24/2014  . CAD (coronary artery disease) 07/22/2011  . HTN (hypertension) 07/22/2011  . Dyslipidemia 07/22/2011  . Smoker 07/22/2011    [facility] Childrens HospitalFREEMAN,Beila Purdie 10/26/2017, 5:16 PM  [place] [facility] [address] [place], [place], [address] Phone: [phone]   Fax:  [phone]  Name: Delora FuelRichard A Wyatt MRN: [id number] Date of Birth: 09-26-1946   Willa FraterAngela Tregan Read, OTR/L [facility] [address] [place], [place]  [address] [phone] phone [phone] 10/26/17 5:16 PM

## 2017-10-27 ENCOUNTER — Encounter: Payer: Medicare HMO | Admitting: Speech Pathology

## 2017-10-27 ENCOUNTER — Encounter: Payer: Medicare HMO | Admitting: Occupational Therapy

## 2017-10-27 ENCOUNTER — Ambulatory Visit: Payer: Medicare HMO | Admitting: Physical Therapy

## 2017-10-28 ENCOUNTER — Ambulatory Visit: Payer: Medicare HMO | Admitting: Physical Therapy

## 2017-10-28 ENCOUNTER — Ambulatory Visit: Payer: Medicare HMO | Admitting: Occupational Therapy

## 2017-10-28 ENCOUNTER — Encounter: Payer: Self-pay | Admitting: Physical Therapy

## 2017-10-28 ENCOUNTER — Telehealth: Payer: Self-pay | Admitting: Physical Therapy

## 2017-10-28 ENCOUNTER — Encounter: Payer: Medicare HMO | Admitting: Occupational Therapy

## 2017-10-28 ENCOUNTER — Ambulatory Visit: Payer: Medicare HMO

## 2017-10-28 VITALS — BP 124/69 | HR 78

## 2017-10-28 DIAGNOSIS — M25511 Pain in right shoulder: Secondary | ICD-10-CM

## 2017-10-28 DIAGNOSIS — M25561 Pain in right knee: Secondary | ICD-10-CM

## 2017-10-28 DIAGNOSIS — R2689 Other abnormalities of gait and mobility: Secondary | ICD-10-CM

## 2017-10-28 DIAGNOSIS — I69251 Hemiplegia and hemiparesis following other nontraumatic intracranial hemorrhage affecting right dominant side: Secondary | ICD-10-CM

## 2017-10-28 DIAGNOSIS — M6281 Muscle weakness (generalized): Secondary | ICD-10-CM

## 2017-10-28 DIAGNOSIS — I69351 Hemiplegia and hemiparesis following cerebral infarction affecting right dominant side: Secondary | ICD-10-CM

## 2017-10-28 DIAGNOSIS — R41841 Cognitive communication deficit: Secondary | ICD-10-CM

## 2017-10-28 DIAGNOSIS — R208 Other disturbances of skin sensation: Secondary | ICD-10-CM

## 2017-10-28 NOTE — Therapy (Signed)
[facility] [address] [place], [place], [address] Phone: [phone]   Fax:  [phone]  Physical Therapy Treatment  Patient Details  Name: Jaime Pierce MRN: [id number] Date of Birth: 1946-11-07 Referring Provider: Ranelle Oyster, MD   Encounter Date: 10/28/2017  PT End of Session - 10/28/17 1253    Visit Number  4    Number of Visits  25    Date for PT Re-Evaluation  12/18/17    Authorization Type  Aetna Medicare - G code and PN every 10th visit    PT Start Time  0855    PT Stop Time  0940    PT Time Calculation (min)  45 min    Activity Tolerance  Patient tolerated treatment well    Behavior During Therapy  [facility] for tasks assessed/performed       Past Medical History:  Diagnosis Date  . Coronary artery disease   . Dyspnea on exertion   . Hyperlipidemia   . Hypertension   . Tobacco abuse     Past Surgical History:  Procedure Laterality Date  . CORONARY ARTERY BYPASS GRAFT      Vitals:   10/28/17 0905  BP: 124/69  Pulse: 78    Subjective Assessment - 10/28/17 0905    Subjective  Pt had recent visit to ED due to 3 seizures; started on Keppra - no further seizures, no significant changes in vision, strength and function.  No falls.  Pt's father passed away, had funeral yesterday.    Patient is accompained by:  Family member [name]-wife    Pertinent History  CAD with CABG, DOE, hyperlipidemia, HTN and tobacco abuse    Patient Stated Goals  To be able to do the things he did before: mow with his tractor, get into and out of the boat for fishing!    Currently in Pain?  No/denies                      [facility] Adult PT Treatment/Exercise - 10/28/17 0916      Transfers   Sit to Stand  3: Mod assist;Without upper extremity assist    Sit to Stand Details (indicate cue type and reason)  x 8 reps from low mat with L foot wedged on 4" block to facilitate weight shift to RLE, bilat UE  resting on RLE for increased weight shift and use of upright gaze and inhalation to facilitate extension through RLE during sit > stand    Stand to Sit  3: Mod assist    Stand to Sit Details  use of counting out loud (exhalation) for more eccentric control during stand > sit with L foot wedged on 4" block to facilitate increased weight shift to R      Ambulation/Gait   Ambulation/Gait  Yes    Ambulation/Gait Assistance  4: Min guard    Ambulation/Gait Assistance Details  pt ambulating without hand orthosis today; initially when ambulating into treatment area pt's R hand slipping off RW especially when distracted; added coban to increase friction and diameter for improved grip.  Pt reported improvement with use of Coban    Ambulation Distance (Feet)  75 Feet    Assistive device  Rolling walker          Balance Exercises - 10/28/17 1251      Balance Exercises: Standing   Standing, One Foot on a Step  Eyes open;4 inch L foot on step, WB RLE,  lifting LLE 5x for 2-3 seconds        PT Education - 10/28/17 1252    Education provided  Yes    Education Details  sit <> stand sequencing for increased use of RLE; use of Bioness functional estimulation - will require MD clearance due to seizures (precaution)    Person(s) Educated  Patient;Spouse    Methods  Explanation    Comprehension  Verbalized understanding       PT Short Term Goals - 10/28/17 1257      PT SHORT TERM GOAL #1   Title  Pt will participate in further assessment of balance with TUG and five time sit to stand with RW    Time  4    Period  Weeks    Status  Achieved      PT SHORT TERM GOAL #2   Title  Pt will perform sit <> stand and stand pivot transfers with LRAD and supervision with <10% cues for safety/sequencing    Baseline  min A with 50% cues    Time  4    Period  Weeks    Status  New    Target Date  11/18/17      PT SHORT TERM GOAL #3   Title  Pt will ambulate x 250' on level, indoor surfaces (to/from  waiting area) and will negotiate short ramp to simulate home entry/exit with LRAD and min A     Baseline  165' with RW min A, w/c for majority of mobility and to enter/exit home on ramp    Time  4    Period  Weeks    Status  New    Target Date  11/18/17      PT SHORT TERM GOAL #4   Title  Pt will improve gait velocity to > or = 1.0 ft/sec    Baseline  .36 ft/sec with RW    Time  4    Period  Weeks    Status  New    Target Date  11/18/17      PT SHORT TERM GOAL #5   Title  Pt will initiate gait training with cane or lesser AD over level, indoor surfaces with therapy    Time  4    Period  Weeks    Status  New    Target Date  11/18/17        PT Long Term Goals - 10/28/17 1256      PT LONG TERM GOAL #1   Title  Pt and wife will demonstrate independence with HEP    Time  8    Period  Weeks    Status  New    Target Date  12/18/16      PT LONG TERM GOAL #2   Title  Pt will decrease TUG time by  seconds with LRAD to </=47 sec. to decr. falls risk.    Baseline  62.62 sec. with RW and R hand orthosis on RW    Time  8    Period  Weeks    Status  New    Target Date  12/18/16      PT LONG TERM GOAL #3   Title  Pt will improve LE strength as indicated by decrease in five time sit to stand to <12 sec.    Baseline  17.27 seconds    Time  8    Period  Weeks    Status  New    Target  Date  12/18/16      PT LONG TERM GOAL #4   Title  Pt will perform bed mobility and all transfers with LRAD MOD I    Time  8    Period  Weeks    Status  New    Target Date  12/18/16      PT LONG TERM GOAL #5   Title  Pt will ambulate >300 over indoor and paved outdoor surfaces and negotiate ramp with LRAD and supervision for more independent household and community mobility    Time  8    Period  Weeks    Status  New    Target Date  12/18/16      PT LONG TERM GOAL #6   Title  Pt will improve gait velocity to > or = 1.468ft/sec with LRAD    Time  8    Period  Weeks    Status  New    Target  Date  12/18/16      PT LONG TERM GOAL #7   Title  Pt will improve overall function on FOTO by 25 points    Baseline  28%    Time  8    Period  Weeks    Status  New    Target Date  12/18/16            Plan - 10/28/17 1253    Clinical Impression Statement  Treatment session focused on adjustment of RW to improve RUE comfort, positioning, grip and attention during gait (without hand orthosis).  Continued to focus on NMR during sit <> stand and static standing to continue to facilitate weight shift and activation of RLE.  Pt is interested in use of BIONESS functional e-stim-PT to discuss with MD due to recent seizures.  Will continue to address.    Rehab Potential  Good    PT Frequency  3x / week    PT Duration  8 weeks    PT Treatment/Interventions  ADLs/Self Care Home Management;Electrical Stimulation;DME Instruction;Gait training;Stair training;Functional mobility training;Therapeutic activities;Therapeutic exercise;Balance training;Neuromuscular re-education;Cognitive remediation;Patient/family education;Orthotic Fit/Training;Passive range of motion;Energy conservation;Taping;Manual techniques    PT Next Visit Plan  R NMR, WB RLE and RUE, gait training, balance, BIONESS?? if cleared by MD due to recent seizures    PT Home Exercise Plan  pt goals include mowing with his tractor and fishing on his boat - incorporate into balance activities    Consulted and Agree with Plan of Care  Patient;Family member/caregiver    Family Member Consulted  wife       Patient will benefit from skilled therapeutic intervention in order to improve the following deficits and impairments:  Abnormal gait, Cardiopulmonary status limiting activity, Decreased balance, Decreased cognition, Decreased endurance, Decreased mobility, Decreased strength, Difficulty walking, Increased muscle spasms, Impaired sensation, Impaired tone, Impaired UE functional use, Postural dysfunction, Pain, Decreased range of motion,  Increased edema  Visit Diagnosis: 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)  Muscle weakness (generalized)  Other abnormalities of gait and mobility  Right knee pain, unspecified chronicity  Other disturbances of skin sensation     Problem List Patient Active Problem List   Diagnosis Date Noted  . Positive colorectal cancer screening using Cologuard test 10/22/2017  . Spastic hemiparesis (HCC)   . Cough   . 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 Pain   . Hyponatremia   . Benign essential HTN   . Hemiparesis of  right dominant side as late effect of nontraumatic intracerebral hemorrhage (HCC)   . Gait disturbance, post-stroke   . Aphasia as late effect of stroke   . SAH (subarachnoid hemorrhage) (HCC) 09/02/2017  . B12 deficiency 09/02/2017  . Well adult exam 06/16/2016  . Cigarette smoker 02/23/2016  . COPD GOLD II  12/24/2014  . CAD (coronary artery disease) 07/22/2011  . HTN (hypertension) 07/22/2011  . Dyslipidemia 07/22/2011  . Smoker 07/22/2011    Dierdre Highman, PT, DPT 10/28/17    1:01 PM    [place] [facility] [address] [place], [place], [address] Phone: [phone]   Fax:  [phone]  Name: SHELL YANDOW MRN: [id number] Date of Birth: 19-Nov-1946

## 2017-10-28 NOTE — Patient Instructions (Signed)
  Please complete the assigned speech therapy homework prior to your next session and return it to the speech therapist at your next visit.  

## 2017-10-28 NOTE — Therapy (Signed)
[facility] [address] [place], [place], [address] Phone: [phone]   Fax:  [phone]  Occupational Therapy Treatment  Patient Details  Name: Jaime Pierce MRN: [id number] Date of Birth: January 28, 1946 Referring Provider: Dr. Dow Adolph   Encounter Date: 10/28/2017  OT End of Session - 10/28/17 1059    Visit Number  4    Number of Visits  24    Date for OT Re-Evaluation  12/18/17    Authorization Type  Aetna MCR - G code required    Authorization - Visit Number  4    Authorization - Number of Visits  10    OT Start Time  0945    OT Stop Time  1040    OT Time Calculation (min)  55 min    Activity Tolerance  Patient tolerated treatment well    Behavior During Therapy  [facility] for tasks assessed/performed       Past Medical History:  Diagnosis Date  . Coronary artery disease   . Dyspnea on exertion   . Hyperlipidemia   . Hypertension   . Tobacco abuse     Past Surgical History:  Procedure Laterality Date  . CORONARY ARTERY BYPASS GRAFT      There were no vitals filed for this visit.  Subjective Assessment - 10/28/17 1053    Pertinent History  ICH Lt parietal lobe 09/01/17. PMH: CAD, SAH, COPD, s/p CABG x 6    Limitations  Recent partial seizure 10/23/17 - no estim unless specific MD clearance, no driving for 6 months    Patient Stated Goals  be independent again    Currently in Pain?  No/denies                   OT Treatments/Exercises (OP) - 10/28/17 0001      ADLs   ADL Comments  Discussion re: partial seziure and recommended discussing with neurologist for further clarification. Also, discussed typical synergy pattern following stroke and spasticity to flexor and IR muscles is typical. Discussed proper positioning and positions to avoid      Neurological Re-education Exercises   Other Exercises 1  Supine: AA/ROM for chest press with mod to max assist, followed by stretches to RUE  in 90* sh. flexion. Sidelying: AA/ROM for sh. flexion and extension with scapula mobilization for upward rotation    Other Exercises 2  Seated: AA/ROM BUE's rolling physioball with mod facilitation RUE and to facilitate wrist extension. Tilted stool activity for RUE activation with min to mod facilitation on reverse tilt and to prevent trunk and shoulder compensations               OT Short Term Goals - 10/21/17 1218      OT SHORT TERM GOAL #1   Title  Independent with initial HEP     Time  4    Period  Weeks    Status  On-going      OT SHORT TERM GOAL #2   Title  Pt/wife to verbalize understanding with edema management (including compression glove) and safety considerations w/ lack of sensation Rt hand    Time  4    Period  Weeks    Status  On-going      OT SHORT TERM GOAL #3   Title  Pt to demo 25 degrees shoulder flexion in prep for low level reaching RUE    Time  4    Period  Weeks  Status  New      OT SHORT TERM GOAL #4   Title  Pt to consistently be mod I level for dressing and bathing with A/E and task modifications prn    Time  4    Period  Weeks    Status  New      OT SHORT TERM GOAL #5   Title  Pt to use Rt hand to feed self finger foods 50% of the time    Time  4    Period  Weeks    Status  New      OT SHORT TERM GOAL #6   Title  Pt to perform shower transfer with CGA only using DME prn    Time  4    Period  Weeks    Status  New        OT Long Term Goals - 10/19/17 1055      OT LONG TERM GOAL #1   Title  Pt to be independent with updated HEP     Time  8    Period  Weeks    Status  New    Target Date  12/18/17      OT LONG TERM GOAL #2   Title  Pt to demo shoulder flexion to 40 degrees or greater for consistent low level reaching    Time  8    Period  Weeks    Status  New      OT LONG TERM GOAL #3   Title  Pt to improve RUE functional use as evidenced by performing 10 blocks on Box & Blocks test    Time  8    Period  Weeks    Status   New      OT LONG TERM GOAL #4   Title  Pt to use Rt hand to write name at 75% legibility or greater with A/E prn    Time  8    Period  Weeks    Status  New      OT LONG TERM GOAL #5   Title  Pt to use Rt hand for eating and grooming 75% of the time    Time  8    Period  Weeks    Status  New      Long Term Additional Goals   Additional Long Term Goals  Yes      OT LONG TERM GOAL #6   Title  Pt to return to simple cooking task with distant supervision and A/E prn    Time  8    Period  Weeks    Status  New            Plan - 10/28/17 1100    Clinical Impression Statement  Pt demo improved AAROM with cueing for compensation/movement.      Occupational Profile and client history currently impacting functional performance  PMH: CAD, HTN, SAH, s/p CABG x 6, COPD    Rehab Potential  Good    OT Frequency  3x / week    OT Duration  8 weeks    OT Treatment/Interventions  Self-care/ADL training;Moist Heat;DME and/or AE instruction;Splinting;Contrast Bath;Compression bandaging;Therapeutic activities;Ultrasound;Therapeutic exercise;Cognitive remediation/compensation;Neuromuscular education;Functional Mobility Training;Passive range of motion;Visual/perceptual remediation/compensation;Electrical Stimulation;Paraffin;Manual Therapy;Patient/family education    Plan  continue NMR RUE and trunk       Patient will benefit from skilled therapeutic intervention in order to improve the following deficits and impairments:  Decreased coordination, Decreased range of motion, Increased edema,  Impaired sensation, Decreased knowledge of precautions, Impaired tone, Decreased activity tolerance, Decreased balance, Decreased knowledge of use of DME, Impaired UE functional use, Pain, Decreased cognition, Decreased mobility, Decreased strength, Impaired vision/preception  Visit Diagnosis: Hemiplegia and hemiparesis following cerebral infarction affecting right dominant side (HCC)  Acute pain of right  shoulder    Problem List Patient Active Problem List   Diagnosis Date Noted  . Positive colorectal cancer screening using Cologuard test 10/22/2017  . Spastic hemiparesis (HCC)   . Cough   . 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 Pain   . Hyponatremia   . Benign essential HTN   . 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 Gait disturbance, post-stroke   . Aphasia as late effect of stroke   . SAH (subarachnoid hemorrhage) (HCC) 09/02/2017  . B12 deficiency 09/02/2017  . Well adult exam 06/16/2016  . Cigarette smoker 02/23/2016  . COPD GOLD II  12/24/2014  . CAD (coronary artery disease) 07/22/2011  . HTN (hypertension) 07/22/2011  . Dyslipidemia 07/22/2011  . Smoker 07/22/2011    Kelli ChurnBallie, Keyuna Cuthrell Johnson, OTR/L 10/28/2017, 12:17 PM  [facility] [address] [place], [place], [address] Phone: [phone]   Fax:  [phone]  Name: Delora FuelRichard A Breiner MRN: [id number] Date of Birth: 06-05-1946

## 2017-10-28 NOTE — Telephone Encounter (Signed)
Hello Dr. Riley KillSwartz, I would like to try our new Bioness unit with [name] but a history of seizures is a precaution for Bioness use and requires physician clearance.    In light of [name] recent seizures and ED visit, do you feel it would be safe for us to use the Bioness on his RLE?    If you think he would be safe please respond with the following statement that I can copy into his therapy note:  " (patient name) is cleared to use functional electrical stimulation on ### extremity in the presence of ### (h/o seizures)".  Thank you, Dierdre HighmanAudra F Khayri Kargbo, PT, DPT 10/28/17    1:08 PM

## 2017-10-28 NOTE — Therapy (Signed)
[facility] [address] [place], [place], [address] Phone: [phone]   Fax:  [phone]  Speech Language Pathology Treatment  Patient Details  Name: Jaime FuelRichard A Liming MRN: [id number] Date of Birth: 09-22-1946 Referring Provider: Faith RogueSwartz, Zachary, MD   Encounter Date: 10/28/2017  End of Session - 10/28/17 1556    Visit Number  2    Number of Visits  25    Date for SLP Re-Evaluation  12/31/17    SLP Start Time  1451    SLP Stop Time   1534    SLP Time Calculation (min)  43 min    Activity Tolerance  Patient tolerated treatment well       Past Medical History:  Diagnosis Date  . Coronary artery disease   . Dyspnea on exertion   . Hyperlipidemia   . Hypertension   . Tobacco abuse     Past Surgical History:  Procedure Laterality Date  . CORONARY ARTERY BYPASS GRAFT      There were no vitals filed for this visit.  Subjective Assessment - 10/28/17 1500    Subjective  Pt with partial seizure on Saturday 10-23-17. SLP told pt to contact MD to alert him/her.    Currently in Pain?  No/denies            ADULT SLP TREATMENT - 10/28/17 1501      General Information   Behavior/Cognition  Alert;Cooperative;Pleasant mood;Distractible      Treatment Provided   Treatment provided  Cognitive-Linquistic      Cognitive-Linquistic Treatment   Treatment focused on  Cognition    Skilled Treatment  SLP provided pt with attention to detail task with awareness also targeted. Pt req'd min-mod A with alternating attention between instruction and figure. Total A needed for emergent awareness, and min verbal cues needed occasionally for finding errors. Pt initially explained away errors but as task progressed pt became more aware of difficulty he had with tihs simple task. "I didn't think I was this stupid." SLP educated pt intellect remained the same post CVA but that pt having difficulty expressing intelligence. Simple  card sort task resulted in pt requiring written cues and once cues were written, pt req'd initial min verbal cues for using the written cues, faded to independent with looking at cues PRN.       Assessment / Recommendations / Plan   Plan  Continue with current plan of care      Progression Toward Goals   Progression toward goals  Progressing toward goals       SLP Education - 10/28/17 1556    Education provided  Yes    Education Details  deficit areas    Person(s) Educated  Patient    Methods  Explanation    Comprehension  Verbalized understanding;Verbal cues required;Need further instruction       SLP Short Term Goals - 10/28/17 1558      SLP SHORT TERM GOAL #1   Title  pt will demo emergent awareness 90% of the time in mod complex cognitive linguistic tasks    Time  4    Period  Weeks    Status  On-going      SLP SHORT TERM GOAL #2   Title  pt will recall memory strategies (WARM) with modified independence over three sessions    Time  4    Period  Weeks    Status  On-going      SLP SHORT TERM GOAL #  3   Title  pt will tell SLP three non-physical deficits over two sessions    Time  4    Period  Weeks    Status  On-going      SLP SHORT TERM GOAL #4   Title  pt will complete min-mod complex reasoning/problem solving tasks involving money/time/etc with 95% success over three sessions    Time  4    Period  Weeks    Status  Revised       SLP Long Term Goals - 10/28/17 1559      SLP LONG TERM GOAL #1   Title  pt will use 1 memory strategy in 5 therapy sessions    Time  8    Period  Weeks    Status  On-going      SLP LONG TERM GOAL #2   Title  pt will demo [facility] executive function skills when presented with problems in everyday tasks over three sessions, with SBA    Time  8    Period  Weeks    Status  On-going      SLP LONG TERM GOAL #3   Title  pt will demo simple divided attention with 90% success on both tasks over three sessions    Time  8    Period  Weeks     Status  On-going      SLP LONG TERM GOAL #4   Title  pt will use anticipatory awareness by double checking written cognitive linguistic tasks 100% of the time in 4 sessions    Time  8    Period  Weeks    Status  On-going       Plan - 10/28/17 1557    Clinical Impression Statement  Pt performance in ST today confirms SLP's initial assessment of pt with cognitive deficits in attention (selective, due to internal distraction?), and working memory, problem solving and reasoning, and intellectual and emergent awareness. Pt will benefit from skilled ST to address these deficits in order to return pt to PLOF.    Speech Therapy Frequency  3x / week    Duration  -- 8 weeks    Treatment/Interventions  SLP instruction and feedback;Compensatory strategies;Patient/family education;Internal/external aids;Cognitive reorganization;Cueing hierarchy;Environmental controls;Language facilitation;Functional tasks    Potential to Achieve Goals  Good       Patient will benefit from skilled therapeutic intervention in order to improve the following deficits and impairments:   Cognitive communication deficit    Problem List Patient Active Problem List   Diagnosis Date Noted  . Positive colorectal cancer screening using Cologuard test 10/22/2017  . Spastic hemiparesis (HCC)   . Cough   . 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 Pain   . Hyponatremia   . Benign essential HTN   . 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 Gait disturbance, post-stroke   . Aphasia as late effect of stroke   . SAH (subarachnoid hemorrhage) (HCC) 09/02/2017  . B12 deficiency 09/02/2017  . Well adult exam 06/16/2016  . Cigarette smoker 02/23/2016  . COPD GOLD II  12/24/2014  . CAD (coronary artery disease) 07/22/2011  . HTN (hypertension) 07/22/2011  . Dyslipidemia 07/22/2011  . Smoker 07/22/2011    [facility] ,MS,  CCC-SLP  10/28/2017, 4:00 PM  [facility] [address] [place], [place], [address] Phone: [phone]   Fax:  [phone]   Name: Gerlene Burdock  Earlie Lou Pujol MRN: [id number] Date of Birth: [birth year]

## 2017-10-29 ENCOUNTER — Ambulatory Visit: Payer: Medicare HMO | Admitting: Physical Therapy

## 2017-10-29 ENCOUNTER — Encounter: Payer: Self-pay | Admitting: Physical Therapy

## 2017-10-29 DIAGNOSIS — R208 Other disturbances of skin sensation: Secondary | ICD-10-CM

## 2017-10-29 DIAGNOSIS — M6281 Muscle weakness (generalized): Secondary | ICD-10-CM

## 2017-10-29 DIAGNOSIS — M25561 Pain in right knee: Secondary | ICD-10-CM

## 2017-10-29 DIAGNOSIS — I69251 Hemiplegia and hemiparesis following other nontraumatic intracranial hemorrhage affecting right dominant side: Secondary | ICD-10-CM

## 2017-10-29 DIAGNOSIS — I69351 Hemiplegia and hemiparesis following cerebral infarction affecting right dominant side: Secondary | ICD-10-CM | POA: Diagnosis not present

## 2017-10-29 DIAGNOSIS — R2689 Other abnormalities of gait and mobility: Secondary | ICD-10-CM

## 2017-10-29 NOTE — Telephone Encounter (Signed)
I would refrain for now

## 2017-10-29 NOTE — Therapy (Signed)
[facility] [address] [place], [place], [address] Phone: [phone]   Fax:  [phone]  Physical Therapy Treatment  Patient Details  Name: Jaime Pierce MRN: [id number] Date of Birth: 1946/03/30 Referring Provider: Ranelle OysterZachary T. Swartz, MD   Encounter Date: 10/29/2017  PT End of Session - 10/29/17 1658    Visit Number  5    Number of Visits  25    Date for PT Re-Evaluation  12/18/17    Authorization Type  [employer] - G code and PN every 10th visit    PT Start Time  1447    PT Stop Time  1536    PT Time Calculation (min)  49 min    Activity Tolerance  Patient tolerated treatment well    Behavior During Therapy  [facility] for tasks assessed/performed       Past Medical History:  Diagnosis Date  . Coronary artery disease   . Dyspnea on exertion   . Hyperlipidemia   . Hypertension   . Tobacco abuse     Past Surgical History:  Procedure Laterality Date  . CORONARY ARTERY BYPASS GRAFT      There were no vitals filed for this visit.  Subjective Assessment - 10/29/17 1453    Subjective  Pt was a little fatigued after yesterday's sessions but no increase in pain.  Has a small scratch on R hand from dog.  Physiatrist recommended to refrain from use of functional electrical stimulation due to recent seizures.    Patient is accompained by:  Family member [name]-wife    Pertinent History  CAD with CABG, DOE, hyperlipidemia, HTN and tobacco abuse    Patient Stated Goals  To be able to do the things he did before: mow with his tractor, get into and out of the boat for fishing!    Currently in Pain?  No/denies                      [facility] Adult PT Treatment/Exercise - 10/29/17 1643      Neuro Re-ed    Neuro Re-ed Details   Standing facing mat with UE in partial WB on bench on top of mat; performed forwards and backwards weight shifting over UE to begin to stretch out R wrist and then progressively  moving feet back for increased WB through UE.  Continued to focus on weight shifting to R UE and LE by lifting LUE off of bench straight ahead and then with rotation to R alternating with lifting LLE off of floor; therapist provided tactile and verbal cues for weight shift and sustained activation through RUE.      Knee/Hip Exercises: Supine   Bridges  Strengthening;Both;1 set;10 reps    Bridges Limitations  with focus on breathing sequence and pressing through UE while lifting hips to facilitate increased extension    Bridges with [employer]sonBall Squeeze  Strengthening;Both;1 set;10 reps    Bridges with Clamshell  Strengthening;Both;1 set;10 reps pressing out against belt for isometric hip ABD         PT Education - 10/29/17 1657    Education provided  Yes    Education Details  table top activities for RUE; progressed bridges in supine    Person(s) Educated  Patient;Spouse    Methods  Explanation;Demonstration;Handout    Comprehension  Returned demonstration;Verbalized understanding       PT Short Term Goals - 10/28/17 1257      PT SHORT TERM GOAL #1  Title  Pt will participate in further assessment of balance with TUG and five time sit to stand with RW    Time  4    Period  Weeks    Status  Achieved      PT SHORT TERM GOAL #2   Title  Pt will perform sit <> stand and stand pivot transfers with LRAD and supervision with <10% cues for safety/sequencing    Baseline  min A with 50% cues    Time  4    Period  Weeks    Status  New    Target Date  11/18/17      PT SHORT TERM GOAL #3   Title  Pt will ambulate x 250' on level, indoor surfaces (to/from waiting area) and will negotiate short ramp to simulate home entry/exit with LRAD and min A     Baseline  165' with RW min A, w/c for majority of mobility and to enter/exit home on ramp    Time  4    Period  Weeks    Status  New    Target Date  11/18/17      PT SHORT TERM GOAL #4   Title  Pt will improve gait velocity to > or = 1.0 ft/sec     Baseline  .36 ft/sec with RW    Time  4    Period  Weeks    Status  New    Target Date  11/18/17      PT SHORT TERM GOAL #5   Title  Pt will initiate gait training with cane or lesser AD over level, indoor surfaces with therapy    Time  4    Period  Weeks    Status  New    Target Date  11/18/17        PT Long Term Goals - 10/28/17 1256      PT LONG TERM GOAL #1   Title  Pt and wife will demonstrate independence with HEP    Time  8    Period  Weeks    Status  New    Target Date  12/18/16      PT LONG TERM GOAL #2   Title  Pt will decrease TUG time by  seconds with LRAD to </=47 sec. to decr. falls risk.    Baseline  62.62 sec. with RW and R hand orthosis on RW    Time  8    Period  Weeks    Status  New    Target Date  12/18/16      PT LONG TERM GOAL #3   Title  Pt will improve LE strength as indicated by decrease in five time sit to stand to <12 sec.    Baseline  17.27 seconds    Time  8    Period  Weeks    Status  New    Target Date  12/18/16      PT LONG TERM GOAL #4   Title  Pt will perform bed mobility and all transfers with LRAD MOD I    Time  8    Period  Weeks    Status  New    Target Date  12/18/16      PT LONG TERM GOAL #5   Title  Pt will ambulate >300 over indoor and paved outdoor surfaces and negotiate ramp with LRAD and supervision for more independent household and community mobility    Time  8  Period  Weeks    Status  New    Target Date  12/18/16      PT LONG TERM GOAL #6   Title  Pt will improve gait velocity to > or = 1.70ft/sec with LRAD    Time  8    Period  Weeks    Status  New    Target Date  12/18/16      PT LONG TERM GOAL #7   Title  Pt will improve overall function on FOTO by 25 points    Baseline  28%    Time  8    Period  Weeks    Status  New    Target Date  12/18/16            Plan - 10/29/17 1659    Clinical Impression Statement  Focused treatment session on progression of bridges for HEP to incorporate more  hip activation and then progressed to partial WB through UE on bench on mat to work towards more challenging, full WB positions.  Pt reported significant stretch/discomfort in wrist with partial WB.  Continued to educate pt and wife on positioning for RUE when in supine or sitting to minimize edema and maintain mm length and joint ROM.  Also educated pt and wife on table top activities to perform with RUE in standing.  Will continue to progress towards LTG.    Rehab Potential  Good    PT Frequency  3x / week    PT Duration  8 weeks    PT Treatment/Interventions  ADLs/Self Care Home Management;Electrical Stimulation;DME Instruction;Gait training;Stair training;Functional mobility training;Therapeutic activities;Therapeutic exercise;Balance training;Neuromuscular re-education;Cognitive remediation;Patient/family education;Orthotic Fit/Training;Passive range of motion;Energy conservation;Taping;Manual techniques    PT Next Visit Plan  NO ESTIM/BIONESS DUE TO RECENT SEIZURES PER DR. SWARTZ;   R NMR, WB RLE and RUE, gait training, balance    PT Home Exercise Plan  pt goals include mowing with his tractor and fishing on his boat - incorporate into balance activities    Consulted and Agree with Plan of Care  Patient;Family member/caregiver    Family Member Consulted  wife       Patient will benefit from skilled therapeutic intervention in order to improve the following deficits and impairments:  Abnormal gait, Cardiopulmonary status limiting activity, Decreased balance, Decreased cognition, Decreased endurance, Decreased mobility, Decreased strength, Difficulty walking, Increased muscle spasms, Impaired sensation, Impaired tone, Impaired UE functional use, Postural dysfunction, Pain, Decreased range of motion, Increased edema  Visit Diagnosis: 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)  Other disturbances of skin sensation  Muscle  weakness (generalized)  Other abnormalities of gait and mobility  Right knee pain, unspecified chronicity     Problem List Patient Active Problem List   Diagnosis Date Noted  . Positive colorectal cancer screening using Cologuard test 10/22/2017  . Spastic hemiparesis (HCC)   . Cough   . 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 Pain   . Hyponatremia   . Benign essential HTN   . 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 Gait disturbance, post-stroke   . Aphasia as late effect of stroke   . SAH (subarachnoid hemorrhage) (HCC) 09/02/2017  . B12 deficiency 09/02/2017  . Well adult exam 06/16/2016  . Cigarette smoker 02/23/2016  . COPD GOLD II  12/24/2014  . CAD (coronary artery disease) 07/22/2011  . HTN (hypertension) 07/22/2011  . Dyslipidemia  07/22/2011  . Smoker 07/22/2011    Dierdre HighmanAudra F Princess Karnes, PT, DPT 10/29/17    5:04 PM    [facility] [address] [place], [place], [address] Phone: [phone]   Fax:  [phone]  Name: Jaime FuelRichard A Pierce MRN: [id number] Date of Birth: 1946-04-08

## 2017-10-29 NOTE — Patient Instructions (Signed)
HIP: Hamstrings - Short Sitting    Rest leg on raised surface. Keep knee straight. Lift chest. Hold __30_ seconds. __3_ reps per set, _2-3__ sets per day, _7__ days per week  Copyright  VHI. All rights reserved.   Piriformis Stretch - Supine    Have a helper pull uninvolved knee across body toward opposite shoulder. Hold slight stretch for _30__ seconds. Repeat with involved leg. Repeat __3_ times. Do _2-3__ times per day.  Copyright  VHI. All rights reserved.    Functional Quadriceps: Sit to Stand    Use walker in front of you. Sit on edge of chair, feet flat on floor. Stand upright, extending knees fully. Repeat __10 times per session. Repeat 2 times a day. Perform every day.   Mini Squat: Double Leg    Use hands on walker for safety. With feet shoulder width apart, reach forward for balance and do a mini squat. Keep knees in line with second toe. Knees do not go past toes. MAKE SURE YOU HAVE EQUAL WEIGHT THROUGH BOTH LEGS.  Repeat __10_ times per set. Rest _30-60__ seconds after set. Do _3__ sets per session. Perform 3 times per week.  http://plyo.exer.us/70   Copyright  VHI. All rights reserved.   Bridge    Lie back, legs bent. Tuck in stomach and then lift hips up towards ceiling and hold for 2 seconds. Then lower hips back down.  Repeat __10__ times. Perform 3 sets. Do __3__ sessions per week.   Repeat bridges with pillow folded between knees: squeeze knees and then lift hips.  Repeat 10 times  Repeat bridges with belt around knees: press out against belt equally and then lift hips.  Repeat 10 times.  FOR ALL BRIDGES: PRESS ARMS/PALMS DOWN INTO BED WHILE LIFTING HIPS; BREATHE IN WHILE LIFTING HIPS, BREATHE OUT WHILE LOWERING HIPS    Toe / Heel Raise (Sitting)    With RIGHT FOOT only, lift toes and hold for 2 seconds and then lower. Next, lift heel and hold for 2 seconds and then lower. Repeat __20__ times. Perform every day.

## 2017-11-01 ENCOUNTER — Ambulatory Visit: Payer: Medicare HMO | Admitting: Occupational Therapy

## 2017-11-01 ENCOUNTER — Ambulatory Visit: Payer: Medicare HMO | Admitting: Speech Pathology

## 2017-11-01 ENCOUNTER — Ambulatory Visit: Payer: Medicare HMO | Admitting: Physical Therapy

## 2017-11-03 ENCOUNTER — Ambulatory Visit: Payer: Medicare HMO

## 2017-11-03 ENCOUNTER — Ambulatory Visit: Payer: Medicare HMO | Admitting: Occupational Therapy

## 2017-11-03 ENCOUNTER — Other Ambulatory Visit: Payer: Self-pay

## 2017-11-03 ENCOUNTER — Encounter: Payer: Medicare HMO | Admitting: Occupational Therapy

## 2017-11-03 DIAGNOSIS — M6281 Muscle weakness (generalized): Secondary | ICD-10-CM

## 2017-11-03 DIAGNOSIS — I69351 Hemiplegia and hemiparesis following cerebral infarction affecting right dominant side: Secondary | ICD-10-CM | POA: Diagnosis not present

## 2017-11-03 DIAGNOSIS — R41841 Cognitive communication deficit: Secondary | ICD-10-CM

## 2017-11-03 DIAGNOSIS — I69251 Hemiplegia and hemiparesis following other nontraumatic intracranial hemorrhage affecting right dominant side: Secondary | ICD-10-CM

## 2017-11-03 DIAGNOSIS — R208 Other disturbances of skin sensation: Secondary | ICD-10-CM

## 2017-11-03 DIAGNOSIS — R2689 Other abnormalities of gait and mobility: Secondary | ICD-10-CM

## 2017-11-03 NOTE — Patient Instructions (Signed)
  Please complete the assigned speech therapy homework prior to your next session and return it to the speech therapist at your next visit.  

## 2017-11-03 NOTE — Therapy (Signed)
[facility] [address] [place], [place], [address] Phone: [phone]   Fax:  [phone]  Physical Therapy Treatment  Patient Details  Name: Jaime Pierce MRN: [id number] Date of Birth: 11-05-1946 Referring Provider: Ranelle OysterZachary T. Swartz, MD   Encounter Date: 11/03/2017  PT End of Session - 11/03/17 1327    Visit Number  6    Number of Visits  25    Date for PT Re-Evaluation  12/18/17    Authorization Type  [employer] Medicare - G code and PN every 10th visit    PT Start Time  1230    PT Stop Time  1314    PT Time Calculation (min)  44 min    Equipment Utilized During Treatment  -- min guard to S prn    Activity Tolerance  Patient tolerated treatment well    Behavior During Therapy  [facility] for tasks assessed/performed       Past Medical History:  Diagnosis Date  . Coronary artery disease   . Dyspnea on exertion   . Hyperlipidemia   . Hypertension   . Tobacco abuse     Past Surgical History:  Procedure Laterality Date  . CORONARY ARTERY BYPASS GRAFT      There were no vitals filed for this visit.  Subjective Assessment - 11/03/17 1237    Subjective  Pt denied falls or changes since last visit. Pt reports he thinks the seizure medication makes him tired. Pt denied seizures since last visit.     Patient is accompained by:  Family member Wife: [name]    Pertinent History  CAD with CABG, DOE, hyperlipidemia, HTN and tobacco abuse    Patient Stated Goals  To be able to do the things he did before: mow with his tractor, get into and out of the boat for fishing!    Currently in Pain?  No/denies                      [facility] Treatment/Exercise - 11/03/17 1238      Ambulation/Gait   Ambulation/Gait  Yes    Ambulation/Gait Assistance  4: Min guard;5: Supervision    Ambulation/Gait Assistance Details  Cues to improve R heel strike, keep R hand on RW and to stay within RW during turns to sit on  mat.     Ambulation Distance (Feet)  115 Feet x3 and 75'    Assistive device  Rolling walker    Gait Pattern  Step-to pattern;Decreased step length - right;Decreased stride length;Decreased hip/knee flexion - right;Decreased dorsiflexion - right;Decreased trunk rotation;Narrow base of support;Poor foot clearance - right    Pre-Gait Activities  Standing with B hands on RW, pt performed lateral weight shifting from L to R side to encourage weight bearing on RLE. Tactile and verbal cues to improve lateral wt. shifing to R side and to keep R knee from buckling with incr. weight bearing.              PT Education - 11/03/17 1327    Education provided  Yes    Education Details  During seated rest breaks: PT educated pt on the importance of proper sleep positioning for R side, and to try body pillow if pt lies on his L side, to support RUE. PT spoke with OT and pt to discuss adding more coband (vs. R h/o since pt finds it uncomfortable), in order to keep R hand on RW during amb.  Person(s) Educated  Patient;Spouse    Methods  Explanation;Demonstration;Verbal cues    Comprehension  Verbalized understanding;Returned demonstration       PT Short Term Goals - 10/28/17 1257      PT SHORT TERM GOAL #1   Title  Pt will participate in further assessment of balance with TUG and five time sit to stand with RW    Time  4    Period  Weeks    Status  Achieved      PT SHORT TERM GOAL #2   Title  Pt will perform sit <> stand and stand pivot transfers with LRAD and supervision with <10% cues for safety/sequencing    Baseline  min A with 50% cues    Time  4    Period  Weeks    Status  New    Target Date  11/18/17      PT SHORT TERM GOAL #3   Title  Pt will ambulate x 250' on level, indoor surfaces (to/from waiting area) and will negotiate short ramp to simulate home entry/exit with LRAD and min A     Baseline  165' with RW min A, w/c for majority of mobility and to enter/exit home on ramp     Time  4    Period  Weeks    Status  New    Target Date  11/18/17      PT SHORT TERM GOAL #4   Title  Pt will improve gait velocity to > or = 1.0 ft/sec    Baseline  .36 ft/sec with RW    Time  4    Period  Weeks    Status  New    Target Date  11/18/17      PT SHORT TERM GOAL #5   Title  Pt will initiate gait training with cane or lesser AD over level, indoor surfaces with therapy    Time  4    Period  Weeks    Status  New    Target Date  11/18/17        PT Long Term Goals - 10/28/17 1256      PT LONG TERM GOAL #1   Title  Pt and wife will demonstrate independence with HEP    Time  8    Period  Weeks    Status  New    Target Date  12/18/16      PT LONG TERM GOAL #2   Title  Pt will decrease TUG time by  seconds with LRAD to </=47 sec. to decr. falls risk.    Baseline  62.62 sec. with RW and R hand orthosis on RW    Time  8    Period  Weeks    Status  New    Target Date  12/18/16      PT LONG TERM GOAL #3   Title  Pt will improve LE strength as indicated by decrease in five time sit to stand to <12 sec.    Baseline  17.27 seconds    Time  8    Period  Weeks    Status  New    Target Date  12/18/16      PT LONG TERM GOAL #4   Title  Pt will perform bed mobility and all transfers with LRAD MOD I    Time  8    Period  Weeks    Status  New    Target Date  12/18/16  PT LONG TERM GOAL #5   Title  Pt will ambulate >300 over indoor and paved outdoor surfaces and negotiate ramp with LRAD and supervision for more independent household and community mobility    Time  8    Period  Weeks    Status  New    Target Date  12/18/16      PT LONG TERM GOAL #6   Title  Pt will improve gait velocity to > or = 1.21ft/sec with LRAD    Time  8    Period  Weeks    Status  New    Target Date  12/18/16      PT LONG TERM GOAL #7   Title  Pt will improve overall function on FOTO by 25 points    Baseline  28%    Time  8    Period  Weeks    Status  New    Target Date   12/18/16            Plan - 11/03/17 1329    Clinical Impression Statement  Pt demonstrated progress, as he was able to amb. with less cues to improve heel strike and upright posture. Pt continues to require cues and intermittent min A to keep R hand on RW during amb. PT discussed this with OT and they are working on a solution. Continue with POC.     Rehab Potential  Good    PT Frequency  3x / week    PT Duration  8 weeks    PT Treatment/Interventions  ADLs/Self Care Home Management;Electrical Stimulation;DME Instruction;Gait training;Stair training;Functional mobility training;Therapeutic activities;Therapeutic exercise;Balance training;Neuromuscular re-education;Cognitive remediation;Patient/family education;Orthotic Fit/Training;Passive range of motion;Energy conservation;Taping;Manual techniques    PT Next Visit Plan  NO ESTIM/BIONESS DUE TO RECENT SEIZURES PER DR. SWARTZ;   R NMR, WB RLE and RUE, gait training, balance (weight shifting and reaching)    PT Home Exercise Plan  pt goals include mowing with his tractor and fishing on his boat - incorporate into balance activities    Consulted and Agree with Plan of Care  Patient;Family member/caregiver    Family Member Consulted  wife       Patient will benefit from skilled therapeutic intervention in order to improve the following deficits and impairments:  Abnormal gait, Cardiopulmonary status limiting activity, Decreased balance, Decreased cognition, Decreased endurance, Decreased mobility, Decreased strength, Difficulty walking, Increased muscle spasms, Impaired sensation, Impaired tone, Impaired UE functional use, Postural dysfunction, Pain, Decreased range of motion, Increased edema  Visit Diagnosis: Other abnormalities of gait and mobility  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 Positive colorectal cancer screening using Cologuard test 10/22/2017  . Spastic hemiparesis (HCC)   . Cough   . 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 Pain   . Hyponatremia   . Benign essential HTN   . 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 Gait disturbance, post-stroke   . Aphasia as late effect of stroke   . SAH (subarachnoid hemorrhage) (HCC) 09/02/2017  . B12 deficiency 09/02/2017  . Well adult exam 06/16/2016  . Cigarette smoker 02/23/2016  . COPD GOLD II  12/24/2014  . CAD (coronary artery disease) 07/22/2011  . HTN (hypertension) 07/22/2011  . Dyslipidemia 07/22/2011  . Smoker 07/22/2011  Kymberlie Brazeau L 11/03/2017, 1:31 PM  [facility] [address] [place], [place], [address] Phone: [phone]   Fax:  [phone]  Name: Jaime Pierce MRN: [id number] Date of Birth: [birth year]  Zerita BoersJennifer Kendallyn Lippold, PT,DPT 11/03/17 1:32 PM Phone: [phone] Fax: [phone]

## 2017-11-03 NOTE — Therapy (Signed)
[facility] [address] [place], [place], [address] Phone: [phone]   Fax:  [phone]  Speech Language Pathology Treatment  Patient Details  Name: Jaime Pierce MRN: [id number] Date of Birth: 01-14-1946 Referring Provider: Faith RogueSwartz, Zachary, MD   Encounter Date: 11/03/2017  End of Session - 11/03/17 1646    Visit Number  3    Number of Visits  25    Date for SLP Re-Evaluation  12/31/17    SLP Start Time  1533    SLP Stop Time   1615    SLP Time Calculation (min)  42 min    Activity Tolerance  Patient tolerated treatment well;Patient limited by fatigue fatigue last 10 minutes       Past Medical History:  Diagnosis Date  . Coronary artery disease   . Dyspnea on exertion   . Hyperlipidemia   . Hypertension   . Tobacco abuse     Past Surgical History:  Procedure Laterality Date  . CORONARY ARTERY BYPASS GRAFT      There were no vitals filed for this visit.  Subjective Assessment - 11/03/17 1548    Subjective  "You gonna make me do some more things to make me feel stupid?"    Currently in Pain?  No/denies            ADULT SLP TREATMENT - 11/03/17 1548      General Information   Behavior/Cognition  Alert;Cooperative;Pleasant mood;Distractible      Treatment Provided   Treatment provided  Cognitive-Linquistic      Cognitive-Linquistic Treatment   Treatment focused on  Cognition    Skilled Treatment  SLP reviewed pt's last visit with him and reminded him of reduced attention/attention to detail. SLP provided a crossword puzzle to work on pt's attention skills. Pt req'd SBA and extra time for the first 10 minutes and after that time req'd SLP min-mod A for alternating attention occasionally. Emergent awareness approx 75%. Pt demonstrated appropriate intellectual awareness stating "This was much harder for me." Pt with feasible but not probable way to make same task easier next time, requiring  SLP A (reduced anticipatory awareness and problem solving).       Assessment / Recommendations / Plan   Plan  Continue with current plan of care      Progression Toward Goals   Progression toward goals  Progressing toward goals         SLP Short Term Goals - 11/03/17 1648      SLP SHORT TERM GOAL #1   Title  pt will demo emergent awareness 90% of the time in mod complex cognitive linguistic tasks    Time  3    Period  Weeks    Status  On-going      SLP SHORT TERM GOAL #2   Title  pt will recall memory strategies (WARM) with modified independence over three sessions    Time  3    Period  Weeks    Status  On-going      SLP SHORT TERM GOAL #3   Title  pt will tell SLP three non-physical deficits over two sessions    Time  3    Period  Weeks    Status  On-going      SLP SHORT TERM GOAL #4   Title  pt will complete min-mod complex reasoning/problem solving tasks involving money/time/etc with 95% success over three sessions    Time  3  Period  Weeks    Status  Revised       SLP Long Term Goals - 11/03/17 1648      SLP LONG TERM GOAL #1   Title  pt will use 1 memory strategy in 5 therapy sessions    Time  7    Period  Weeks    Status  On-going      SLP LONG TERM GOAL #2   Title  pt will demo [facility] executive function skills when presented with problems in everyday tasks over three sessions, with SBA    Time  7    Period  Weeks    Status  On-going      SLP LONG TERM GOAL #3   Title  pt will demo simple divided attention with 90% success on both tasks over three sessions    Time  7    Period  Weeks    Status  On-going      SLP LONG TERM GOAL #4   Title  pt will use anticipatory awareness by double checking written cognitive linguistic tasks 100% of the time in 4 sessions    Time  7    Period  Weeks    Status  On-going       Plan - 11/03/17 1647    Clinical Impression Statement  Pt cont to exhibit cognitive deficits in attention (selective, due to internal  distraction?), and working memory, problem solving and reasoning, and emergent and anticipatory awareness. Intellectual awareness for ST tasks is improved. Pt will benefit from skilled ST to address these deficits in order to return pt to PLOF.    Speech Therapy Frequency  3x / week    Duration  -- 8 weeks    Treatment/Interventions  SLP instruction and feedback;Compensatory strategies;Patient/family education;Internal/external aids;Cognitive reorganization;Cueing hierarchy;Environmental controls;Language facilitation;Functional tasks    Potential to Achieve Goals  Good       Patient will benefit from skilled therapeutic intervention in order to improve the following deficits and impairments:   Cognitive communication deficit    Problem List Patient Active Problem List   Diagnosis Date Noted  . Positive colorectal cancer screening using Cologuard test 10/22/2017  . Spastic hemiparesis (HCC)   . Cough   . 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 Pain   . Hyponatremia   . Benign essential HTN   . 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 Gait disturbance, post-stroke   . Aphasia as late effect of stroke   . SAH (subarachnoid hemorrhage) (HCC) 09/02/2017  . B12 deficiency 09/02/2017  . Well adult exam 06/16/2016  . Cigarette smoker 02/23/2016  . COPD GOLD II  12/24/2014  . CAD (coronary artery disease) 07/22/2011  . HTN (hypertension) 07/22/2011  . Dyslipidemia 07/22/2011  . Smoker 07/22/2011    [facility] Asc[facility]INKE,Cindia Hustead ,MS, CCC-SLP  11/03/2017, 4:48 PM  [facility] [address] [place], [place], [address] Phone: [phone]   Fax:  [phone]   Name: Jaime Pierce MRN: [id number] Date of Birth: 05-Feb-1946

## 2017-11-04 ENCOUNTER — Ambulatory Visit: Payer: Medicare HMO | Admitting: Speech Pathology

## 2017-11-04 ENCOUNTER — Other Ambulatory Visit: Payer: Self-pay

## 2017-11-04 ENCOUNTER — Ambulatory Visit: Payer: Medicare HMO

## 2017-11-04 ENCOUNTER — Ambulatory Visit: Payer: Medicare HMO | Admitting: Occupational Therapy

## 2017-11-04 ENCOUNTER — Encounter: Payer: Self-pay | Admitting: Occupational Therapy

## 2017-11-04 ENCOUNTER — Encounter: Payer: Self-pay | Admitting: Speech Pathology

## 2017-11-04 VITALS — BP 111/71 | HR 84

## 2017-11-04 DIAGNOSIS — R2689 Other abnormalities of gait and mobility: Secondary | ICD-10-CM

## 2017-11-04 DIAGNOSIS — I69251 Hemiplegia and hemiparesis following other nontraumatic intracranial hemorrhage affecting right dominant side: Secondary | ICD-10-CM

## 2017-11-04 DIAGNOSIS — R208 Other disturbances of skin sensation: Secondary | ICD-10-CM

## 2017-11-04 DIAGNOSIS — R41841 Cognitive communication deficit: Secondary | ICD-10-CM

## 2017-11-04 DIAGNOSIS — M6281 Muscle weakness (generalized): Secondary | ICD-10-CM

## 2017-11-04 DIAGNOSIS — I69351 Hemiplegia and hemiparesis following cerebral infarction affecting right dominant side: Secondary | ICD-10-CM | POA: Diagnosis not present

## 2017-11-04 DIAGNOSIS — R2681 Unsteadiness on feet: Secondary | ICD-10-CM

## 2017-11-04 NOTE — Therapy (Signed)
[facility] [address] [place], [place], [address] Phone: [phone]   Fax:  [phone]  Occupational Therapy Treatment  Patient Details  Name: Jaime Pierce MRN: [id number] Date of Birth: 1946-07-29 Referring Provider (Historical): Dr. Dow Adolph   Encounter Date: 11/03/2017  OT End of Session - 11/04/17 1143    Visit Number  5    Number of Visits  24    Date for OT Re-Evaluation  12/18/17    Authorization Type  [employer] MCR - G code required    Authorization - Visit Number  5    Authorization - Number of Visits  10    OT Start Time  1320    OT Stop Time  1400    OT Time Calculation (min)  40 min    Activity Tolerance  Patient tolerated treatment well    Behavior During Therapy  [facility] for tasks assessed/performed       Past Medical History:  Diagnosis Date  . Coronary artery disease   . Dyspnea on exertion   . Hyperlipidemia   . Hypertension   . Tobacco abuse     Past Surgical History:  Procedure Laterality Date  . CORONARY ARTERY BYPASS GRAFT      There were no vitals filed for this visit.  Subjective Assessment - 11/04/17 1142    Subjective   No denies    Pertinent History  ICH Lt parietal lobe 09/01/17. PMH: CAD, SAH, COPD, s/p CABG x 6    Limitations  Recent partial seizure 10/23/17 - no estim unless specific MD clearance, no driving for 6 months    Patient Stated Goals  be independent again    Currently in Pain?  No/denies           Treatment: Supine chest press, and shoulder flexion closed chain with mod/ max facilitation Seated AA/ROM low range shoulder flexion weightbearing through tilted stool, mod facilitation  Functional grasp / release of cylindrical dowel pegs, mod/ max facilitation Encouraged pt's wife to bring in walker splint next visit for further assessment of use.                   OT Short Term Goals - 10/21/17 1218      OT SHORT TERM GOAL #1   Title  Independent with initial HEP     Time  4    Period  Weeks    Status  On-going      OT SHORT TERM GOAL #2   Title  Pt/wife to verbalize understanding with edema management (including compression glove) and safety considerations w/ lack of sensation Rt hand    Time  4    Period  Weeks    Status  On-going      OT SHORT TERM GOAL #3   Title  Pt to demo 25 degrees shoulder flexion in prep for low level reaching RUE    Time  4    Period  Weeks    Status  New      OT SHORT TERM GOAL #4   Title  Pt to consistently be mod I level for dressing and bathing with A/E and task modifications prn    Time  4    Period  Weeks    Status  New      OT SHORT TERM GOAL #5   Title  Pt to use Rt hand to feed self finger foods 50% of the time    Time  4    Period  Weeks    Status  New      OT SHORT TERM GOAL #6   Title  Pt to perform shower transfer with CGA only using DME prn    Time  4    Period  Weeks    Status  New        OT Long Term Goals - 10/19/17 1055      OT LONG TERM GOAL #1   Title  Pt to be independent with updated HEP     Time  8    Period  Weeks    Status  New    Target Date  12/18/17      OT LONG TERM GOAL #2   Title  Pt to demo shoulder flexion to 40 degrees or greater for consistent low level reaching    Time  8    Period  Weeks    Status  New      OT LONG TERM GOAL #3   Title  Pt to improve RUE functional use as evidenced by performing 10 blocks on Box & Blocks test    Time  8    Period  Weeks    Status  New      OT LONG TERM GOAL #4   Title  Pt to use Rt hand to write name at 75% legibility or greater with A/E prn    Time  8    Period  Weeks    Status  New      OT LONG TERM GOAL #5   Title  Pt to use Rt hand for eating and grooming 75% of the time    Time  8    Period  Weeks    Status  New      Long Term Additional Goals   Additional Long Term Goals  Yes      OT LONG TERM GOAL #6   Title  Pt to return to simple cooking task with distant  supervision and A/E prn    Time  8    Period  Weeks    Status  New            Plan - 11/04/17 1144    Clinical Impression Statement  Pt is progressing towards goals for RUE functional use.    Rehab Potential  Good    OT Frequency  3x / week    OT Duration  8 weeks    OT Treatment/Interventions  Self-care/ADL training;Moist Heat;DME and/or AE instruction;Splinting;Contrast Bath;Compression bandaging;Therapeutic activities;Ultrasound;Therapeutic exercise;Cognitive remediation/compensation;Neuromuscular education;Functional Mobility Training;Passive range of motion;Visual/perceptual remediation/compensation;Electrical Stimulation;Paraffin;Manual Therapy;Patient/family education    Plan  continue NMR RUE and trunk    Consulted and Agree with Plan of Care  Patient;Family member/caregiver    Family Member Consulted  wife       Patient will benefit from skilled therapeutic intervention in order to improve the following deficits and impairments:  Decreased coordination, Decreased range of motion, Increased edema, Impaired sensation, Decreased knowledge of precautions, Impaired tone, Decreased activity tolerance, Decreased balance, Decreased knowledge of use of DME, Impaired UE functional use, Pain, Decreased cognition, Decreased mobility, Decreased strength, Impaired vision/preception  Visit Diagnosis: 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 Positive colorectal cancer screening using Cologuard test 10/22/2017  . Spastic hemiparesis (HCC)   .  Cough   . 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 Pain   . Hyponatremia   . Benign essential HTN   . 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 Gait  disturbance, post-stroke   . Aphasia as late effect of stroke   . SAH (subarachnoid hemorrhage) (HCC) 09/02/2017  . B12 deficiency 09/02/2017  . Well adult exam 06/16/2016  . Cigarette smoker 02/23/2016  . COPD GOLD II  12/24/2014  . CAD (coronary artery disease) 07/22/2011  . HTN (hypertension) 07/22/2011  . Dyslipidemia 07/22/2011  . Smoker 07/22/2011    RINE,KATHRYN 11/04/2017, 11:45 AM   [facility] [address] [place], [place], [address] Phone: [phone]   Fax:  [phone]  Name: Jaime Pierce MRN: [id number] Date of Birth: 1946/10/21

## 2017-11-04 NOTE — Therapy (Signed)
[facility] [address] [place], [place], [address] Phone: [phone]   Fax:  [phone]  Occupational Therapy Treatment  Patient Details  Name: Jaime Pierce MRN: [id number] Date of Birth: 1946/04/17 Referring Provider (Historical): Dr. Dow Adolph   Encounter Date: 11/04/2017  OT End of Session - 11/04/17 1617    Visit Number  6    Number of Visits  24    Date for OT Re-Evaluation  12/18/17    Authorization Type  Aetna MCR - G code required    Authorization - Visit Number  6    Authorization - Number of Visits  10    OT Start Time  1445    OT Stop Time  1530    OT Time Calculation (min)  45 min    Activity Tolerance  Patient tolerated treatment well    Behavior During Therapy  [facility] for tasks assessed/performed       Past Medical History:  Diagnosis Date  . Coronary artery disease   . Dyspnea on exertion   . Hyperlipidemia   . Hypertension   . Tobacco abuse     Past Surgical History:  Procedure Laterality Date  . CORONARY ARTERY BYPASS GRAFT      There were no vitals filed for this visit.  Subjective Assessment - 11/04/17 1536    Subjective   Per wife, pt fell down to knees going from the waiting room to the vestibule yesterday, but no injuries    Patient is accompained by:  Family member    Pertinent History  ICH Lt parietal lobe 09/01/17. PMH: CAD, SAH, COPD, s/p CABG x 6    Limitations  Recent partial seizure 10/23/17 - no estim unless specific MD clearance, no driving for 6 months    Patient Stated Goals  be independent again    Currently in Pain?  No/denies                   OT Treatments/Exercises (OP) - 11/04/17 0001      ADLs   ADL Comments  Assessed positioning of walker splint for comfort and for carryover of use for safety w/ ambulation. Added padding for increased comfort. Pt practiced walking with walker splint      Neurological Re-education Exercises   Other  Exercises 1  Closed chain AA/ROM in low and mid ranges with UE Ranger for RUE    Other Exercises 2  Closed chain body on arm movements to activate RUE in approx. 70 degrees sh. flexion    Other Weight-Bearing Exercises 1  Wt bearing over RUE with sit to squat ex's with min assist               OT Short Term Goals - 10/21/17 1218      OT SHORT TERM GOAL #1   Title  Independent with initial HEP     Time  4    Period  Weeks    Status  On-going      OT SHORT TERM GOAL #2   Title  Pt/wife to verbalize understanding with edema management (including compression glove) and safety considerations w/ lack of sensation Rt hand    Time  4    Period  Weeks    Status  On-going      OT SHORT TERM GOAL #3   Title  Pt to demo 25 degrees shoulder flexion in prep for low level reaching RUE    Time  4  Period  Weeks    Status  New      OT SHORT TERM GOAL #4   Title  Pt to consistently be mod I level for dressing and bathing with A/E and task modifications prn    Time  4    Period  Weeks    Status  New      OT SHORT TERM GOAL #5   Title  Pt to use Rt hand to feed self finger foods 50% of the time    Time  4    Period  Weeks    Status  New      OT SHORT TERM GOAL #6   Title  Pt to perform shower transfer with CGA only using DME prn    Time  4    Period  Weeks    Status  New        OT Long Term Goals - 10/19/17 1055      OT LONG TERM GOAL #1   Title  Pt to be independent with updated HEP     Time  8    Period  Weeks    Status  New    Target Date  12/18/17      OT LONG TERM GOAL #2   Title  Pt to demo shoulder flexion to 40 degrees or greater for consistent low level reaching    Time  8    Period  Weeks    Status  New      OT LONG TERM GOAL #3   Title  Pt to improve RUE functional use as evidenced by performing 10 blocks on Box & Blocks test    Time  8    Period  Weeks    Status  New      OT LONG TERM GOAL #4   Title  Pt to use Rt hand to write name at 75%  legibility or greater with A/E prn    Time  8    Period  Weeks    Status  New      OT LONG TERM GOAL #5   Title  Pt to use Rt hand for eating and grooming 75% of the time    Time  8    Period  Weeks    Status  New      Long Term Additional Goals   Additional Long Term Goals  Yes      OT LONG TERM GOAL #6   Title  Pt to return to simple cooking task with distant supervision and A/E prn    Time  8    Period  Weeks    Status  New            Plan - 11/04/17 1618    Clinical Impression Statement  Pt is progressing towards goals for RUE functional use.    Occupational Profile and client history currently impacting functional performance  PMH: CAD, HTN, SAH, s/p CABG x 6, COPD    Rehab Potential  Good    OT Frequency  2x / week    OT Duration  8 weeks    OT Treatment/Interventions  Self-care/ADL training;Moist Heat;DME and/or AE instruction;Splinting;Contrast Bath;Compression bandaging;Therapeutic activities;Ultrasound;Therapeutic exercise;Cognitive remediation/compensation;Neuromuscular education;Functional Mobility Training;Passive range of motion;Visual/perceptual remediation/compensation;Electrical Stimulation;Paraffin;Manual Therapy;Patient/family education    Plan  sidelying for AA/ROM RUE w/ scapular mobs, modified wt bearing RUE in standing    Consulted and Agree with Plan of Care  Patient;Family member/caregiver    Family Member  Consulted  wife       Patient will benefit from skilled therapeutic intervention in order to improve the following deficits and impairments:  Decreased coordination, Decreased range of motion, Increased edema, Impaired sensation, Decreased knowledge of precautions, Impaired tone, Decreased activity tolerance, Decreased balance, Decreased knowledge of use of DME, Impaired UE functional use, Pain, Decreased cognition, Decreased mobility, Decreased strength, Impaired vision/preception  Visit Diagnosis: 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 Positive colorectal cancer screening using Cologuard test 10/22/2017  . Spastic hemiparesis (HCC)   . Cough   . 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 Pain   . Hyponatremia   . Benign essential HTN   . 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 Gait disturbance, post-stroke   . Aphasia as late effect of stroke   . SAH (subarachnoid hemorrhage) (HCC) 09/02/2017  . B12 deficiency 09/02/2017  . Well adult exam 06/16/2016  . Cigarette smoker 02/23/2016  . COPD GOLD II  12/24/2014  . CAD (coronary artery disease) 07/22/2011  . HTN (hypertension) 07/22/2011  . Dyslipidemia 07/22/2011  . Smoker 07/22/2011    Kelli ChurnBallie, Audyn Dimercurio Johnson, OTR/L 11/04/2017, 4:20 PM  [facility] [address] [place], [place], [address] Phone: [phone]   Fax:  [phone]  Name: Jaime Pierce MRN: [id number] Date of Birth: 09/08/1946

## 2017-11-04 NOTE — Therapy (Signed)
[facility] [address] [place], [place], [address] Phone: [phone]   Fax:  [phone]  Physical Therapy Treatment  Patient Details  Name: Jaime Pierce: [id number] Date of Birth: 03-08-1946 Referring Provider: Ranelle Oyster, MD   Encounter Date: 11/04/2017  PT End of Session - 11/04/17 0940    Visit Number  7    Number of Visits  25    Date for PT Re-Evaluation  12/18/17    Authorization Type  Aetna Medicare - G code and PN every 10th visit    PT Start Time  0847    PT Stop Time  0929    PT Time Calculation (min)  42 min    Equipment Utilized During Treatment  Gait belt    Activity Tolerance  Patient tolerated treatment well    Behavior During Therapy  [facility] for tasks assessed/performed       Past Medical History:  Diagnosis Date  . Coronary artery disease   . Dyspnea on exertion   . Hyperlipidemia   . Hypertension   . Tobacco abuse     Past Surgical History:  Procedure Laterality Date  . CORONARY ARTERY BYPASS GRAFT      Vitals:   11/04/17 0912  BP: 111/71  Pulse: 84    Subjective Assessment - 11/04/17 0855    Subjective  Pt reported he fell yesterday while leaving therapy sessions but denied injury and stated his knees feel fine.     Patient is accompained by:  Family member wife: Jaime Pierce    Pertinent History  CAD with CABG, DOE, hyperlipidemia, HTN and tobacco abuse    Patient Stated Goals  To be able to do the things he did before: mow with his tractor, get into and out of the boat for fishing!    Currently in Pain?  No/denies                      [facility] PT Treatment/Exercise - 11/04/17 0857      Ambulation/Gait   Ambulation/Gait  Yes    Ambulation/Gait Assistance  4: Min guard;5: Supervision    Ambulation/Gait Assistance Details  Cues to improve R heel strike, R hand placement on RW, and upright posture. Pt also performed 1/2 turns to improve lateral  weight shifting.    Ambulation Distance (Feet)  100 Feet 75', 20'x2    Assistive device  Rolling walker    Gait Pattern  Step-to pattern;Decreased step length - right;Decreased stride length;Decreased hip/knee flexion - right;Decreased dorsiflexion - right;Decreased trunk rotation;Narrow base of support;Poor foot clearance - right      High Level Balance   High Level Balance Activities  Side stepping;Backward walking    High Level Balance Comments  Performed in //bars with min guard for safety. Performed 4-6x/activity. Cues to improve upright posture, weight shifting onto RLE, remember to move R hand along the // bars.             PT Education - 11/03/17 1327    Education provided  Yes    Education Details  During seated rest breaks: PT educated pt on the importance of proper sleep positioning for R side, and to try body pillow if pt lies on his L side, to support RUE. PT spoke with OT and pt to discuss adding more coband (vs. R h/o since pt finds it uncomfortable), in order to keep R hand on RW during amb.  Person(s) Educated  Patient;Spouse    Methods  Explanation;Demonstration;Verbal cues    Comprehension  Verbalized understanding;Returned demonstration       PT Short Term Goals - 10/28/17 1257      PT SHORT TERM GOAL #1   Title  Pt will participate in further assessment of balance with TUG and five time sit to stand with RW    Time  4    Period  Weeks    Status  Achieved      PT SHORT TERM GOAL #2   Title  Pt will perform sit <> stand and stand pivot transfers with LRAD and supervision with <10% cues for safety/sequencing    Baseline  min A with 50% cues    Time  4    Period  Weeks    Status  New    Target Date  11/18/17      PT SHORT TERM GOAL #3   Title  Pt will ambulate x 250' on level, indoor surfaces (to/from waiting area) and will negotiate short ramp to simulate home entry/exit with LRAD and min A     Baseline  165' with RW min A, w/c for majority of mobility  and to enter/exit home on ramp    Time  4    Period  Weeks    Status  New    Target Date  11/18/17      PT SHORT TERM GOAL #4   Title  Pt will improve gait velocity to > or = 1.0 ft/sec    Baseline  .36 ft/sec with RW    Time  4    Period  Weeks    Status  New    Target Date  11/18/17      PT SHORT TERM GOAL #5   Title  Pt will initiate gait training with cane or lesser AD over level, indoor surfaces with therapy    Time  4    Period  Weeks    Status  New    Target Date  11/18/17        PT Long Term Goals - 10/28/17 1256      PT LONG TERM GOAL #1   Title  Pt and wife will demonstrate independence with HEP    Time  8    Period  Weeks    Status  New    Target Date  12/18/16      PT LONG TERM GOAL #2   Title  Pt will decrease TUG time by  seconds with LRAD to </=47 sec. to decr. falls risk.    Baseline  62.62 sec. with RW and R hand orthosis on RW    Time  8    Period  Weeks    Status  New    Target Date  12/18/16      PT LONG TERM GOAL #3   Title  Pt will improve LE strength as indicated by decrease in five time sit to stand to <12 sec.    Baseline  17.27 seconds    Time  8    Period  Weeks    Status  New    Target Date  12/18/16      PT LONG TERM GOAL #4   Title  Pt will perform bed mobility and all transfers with LRAD MOD I    Time  8    Period  Weeks    Status  New    Target Date  12/18/16  PT LONG TERM GOAL #5   Title  Pt will ambulate >300 over indoor and paved outdoor surfaces and negotiate ramp with LRAD and supervision for more independent household and community mobility    Time  8    Period  Weeks    Status  New    Target Date  12/18/16      PT LONG TERM GOAL #6   Title  Pt will improve gait velocity to > or = 1.1098ft/sec with LRAD    Time  8    Period  Weeks    Status  New    Target Date  12/18/16      PT LONG TERM GOAL #7   Title  Pt will improve overall function on FOTO by 25 points    Baseline  28%    Time  8    Period  Weeks     Status  New    Target Date  12/18/16            Plan - 11/04/17 0941    Clinical Impression Statement  Pt demonstrated progress, as he was able to tolerate high level balance activiites with cues to improve lateral weight shifting onto RLE, upright posture, and R hand placement. PT required 3 seated rest breaks 2/2 fatigue and to improve quality of movement during balance and gait activities. Pt's BP was WNL when assessed during session. Pt would continue to benefit from skilled PT to improve safety during functional mobility.     Rehab Potential  Good    PT Frequency  3x / week    PT Duration  8 weeks    PT Treatment/Interventions  ADLs/Self Care Home Management;Electrical Stimulation;DME Instruction;Gait training;Stair training;Functional mobility training;Therapeutic activities;Therapeutic exercise;Balance training;Neuromuscular re-education;Cognitive remediation;Patient/family education;Orthotic Fit/Training;Passive range of motion;Energy conservation;Taping;Manual techniques    PT Next Visit Plan  NO ESTIM/BIONESS DUE TO RECENT SEIZURES PER DR. SWARTZ;   R NMR, WB RLE and RUE, gait training, balance (weight shifting and reaching)    PT Home Exercise Plan  pt goals include mowing with his tractor and fishing on his boat - incorporate into balance activities    Consulted and Agree with Plan of Care  Patient;Family member/caregiver    Family Member Consulted  wife       Patient will benefit from skilled therapeutic intervention in order to improve the following deficits and impairments:  Abnormal gait, Cardiopulmonary status limiting activity, Decreased balance, Decreased cognition, Decreased endurance, Decreased mobility, Decreased strength, Difficulty walking, Increased muscle spasms, Impaired sensation, Impaired tone, Impaired UE functional use, Postural dysfunction, Pain, Decreased range of motion, Increased edema  Visit Diagnosis: Other abnormalities of gait and  mobility  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 Positive colorectal cancer screening using Cologuard test 10/22/2017  . Spastic hemiparesis (HCC)   . Cough   . 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 Pain   . Hyponatremia   . Benign essential HTN   . 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 Gait disturbance, post-stroke   . Aphasia as late effect of stroke   . SAH (subarachnoid hemorrhage) (HCC) 09/02/2017  . B12 deficiency 09/02/2017  . Well adult exam 06/16/2016  . Cigarette smoker 02/23/2016  . COPD GOLD II  12/24/2014  . CAD (coronary  artery disease) 07/22/2011  . HTN (hypertension) 07/22/2011  . Dyslipidemia 07/22/2011  . Smoker 07/22/2011    Miller,Jennifer L 11/04/2017, 9:46 AM  [facility] [address] [place], [place], [address] Phone: [phone]   Fax:  [phone]  Name: Jaime Pierce Pierce: [id number] Date of Birth: [birth year]  Zerita Boers, PT,DPT 11/04/17 9:46 AM Phone: [phone] Fax: [phone]

## 2017-11-04 NOTE — Therapy (Signed)
[facility] [address] [place], [place], [address] Phone: [phone]   Fax:  [phone]  Speech Language Pathology Treatment  Patient Details  Name: Jaime Pierce MRN: [id number] Date of Birth: Nov 10, 1946 Referring Provider: Faith RogueSwartz, Zachary, MD   Encounter Date: 11/04/2017  End of Session - 11/04/17 1316    Visit Number  4    Number of Visits  25    Date for SLP Re-Evaluation  12/31/17    SLP Start Time  1017    SLP Stop Time   1100    SLP Time Calculation (min)  43 min    Activity Tolerance  Patient tolerated treatment well       Past Medical History:  Diagnosis Date  . Coronary artery disease   . Dyspnea on exertion   . Hyperlipidemia   . Hypertension   . Tobacco abuse     Past Surgical History:  Procedure Laterality Date  . CORONARY ARTERY BYPASS GRAFT      There were no vitals filed for this visit.  Subjective Assessment - 11/04/17 1022    Subjective  "It's been brought to my attention that i can't remember everything"    Currently in Pain?  No/denies            ADULT SLP TREATMENT - 11/04/17 1022      General Information   Behavior/Cognition  Alert;Cooperative;Pleasant mood;Distractible    Patient Positioning  Upright in chair      Treatment Provided   Treatment provided  Cognitive-Linquistic      Pain Assessment   Pain Assessment  No/denies pain      Cognitive-Linquistic Treatment   Treatment focused on  Cognition    Skilled Treatment  Pt told SLP 2 non-physical deficits this session: memory and attention ("sometimes I don't focus as good as I should."). SLP faciliated working memory with simple 3 word mental manipulation task. Pt with 60% accuracy initially; SLP reviewed WARM strategies. Pt used repetition to improve immediate recall of words, improved accuracy to 85%. Paragraph retention/inferencing 90% accuracy. Began deductive reasoning puzzle 1; pt required extended time  as well as occasional min-mod A for alternating attention, task persistence. Pt demo'd intellectual awareness, acknowledging difficulty focusing as the task progressed.        Assessment / Recommendations / Plan   Plan  Continue with current plan of care      Progression Toward Goals   Progression toward goals  Progressing toward goals         SLP Short Term Goals - 11/04/17 1313      SLP SHORT TERM GOAL #1   Title  pt will demo emergent awareness 90% of the time in mod complex cognitive linguistic tasks    Time  3    Period  Weeks    Status  On-going      SLP SHORT TERM GOAL #2   Title  pt will recall memory strategies (WARM) with modified independence over three sessions    Time  3    Period  Weeks    Status  On-going      SLP SHORT TERM GOAL #3   Title  pt will tell SLP three non-physical deficits over two sessions    Time  3    Period  Weeks    Status  On-going      SLP SHORT TERM GOAL #4   Title  pt will complete min-mod complex reasoning/problem solving tasks involving money/time/etc  with 95% success over three sessions    Time  3    Period  Weeks    Status  On-going       SLP Long Term Goals - 11/04/17 1314      SLP LONG TERM GOAL #1   Title  pt will use 1 memory strategy in 5 therapy sessions    Time  7    Period  Weeks    Status  On-going      SLP LONG TERM GOAL #2   Title  pt will demo [facility] executive function skills when presented with problems in everyday tasks over three sessions, with SBA    Time  7    Period  Weeks    Status  On-going      SLP LONG TERM GOAL #3   Title  pt will demo simple divided attention with 90% success on both tasks over three sessions    Time  7    Period  Weeks    Status  On-going       Plan - 11/04/17 1316    Clinical Impression Statement  Pt cont to exhibit cognitive deficits in attention (selective, due to internal distraction?), and working memory, problem solving and reasoning, and emergent and anticipatory  awareness. Intellectual awareness for ST tasks is improved. Pt will benefit from skilled ST to address these deficits in order to return pt to PLOF.    Speech Therapy Frequency  3x / week    Treatment/Interventions  SLP instruction and feedback;Compensatory strategies;Patient/family education;Internal/external aids;Cognitive reorganization;Cueing hierarchy;Environmental controls;Language facilitation;Functional tasks    Potential to Achieve Goals  Good    Consulted and Agree with Plan of Care  Patient       Patient will benefit from skilled therapeutic intervention in order to improve the following deficits and impairments:   Cognitive communication deficit    Problem List Patient Active Problem List   Diagnosis Date Noted  . Positive colorectal cancer screening using Cologuard test 10/22/2017  . Spastic hemiparesis (HCC)   . Cough   . 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 Pain   . Hyponatremia   . Benign essential HTN   . 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 Gait disturbance, post-stroke   . Aphasia as late effect of stroke   . SAH (subarachnoid hemorrhage) (HCC) 09/02/2017  . B12 deficiency 09/02/2017  . Well adult exam 06/16/2016  . Cigarette smoker 02/23/2016  . COPD GOLD II  12/24/2014  . CAD (coronary artery disease) 07/22/2011  . HTN (hypertension) 07/22/2011  . Dyslipidemia 07/22/2011  . Smoker 07/22/2011   Rondel BatonMary Beth Spruha Weight, MS, CCC-SLP Speech-Language Pathologist   Arlana LindauMary E Marquez Ceesay 11/04/2017, 3:31 PM  [facility] [address] [place], [place], [address] Phone: [phone]   Fax:  [phone]   Name: Jaime Pierce MRN: [id number] Date of Birth: 10-Sep-1946

## 2017-11-05 ENCOUNTER — Ambulatory Visit: Payer: Medicare HMO | Admitting: Physical Therapy

## 2017-11-08 ENCOUNTER — Encounter: Payer: Medicare HMO | Attending: Physical Medicine & Rehabilitation | Admitting: Physical Medicine & Rehabilitation

## 2017-11-08 ENCOUNTER — Ambulatory Visit: Payer: Medicare HMO | Admitting: Occupational Therapy

## 2017-11-08 ENCOUNTER — Encounter: Payer: Self-pay | Admitting: Physical Medicine & Rehabilitation

## 2017-11-08 VITALS — BP 116/73 | HR 71

## 2017-11-08 DIAGNOSIS — R569 Unspecified convulsions: Secondary | ICD-10-CM | POA: Diagnosis not present

## 2017-11-08 DIAGNOSIS — I69398 Other sequelae of cerebral infarction: Secondary | ICD-10-CM | POA: Diagnosis not present

## 2017-11-08 DIAGNOSIS — J449 Chronic obstructive pulmonary disease, unspecified: Secondary | ICD-10-CM | POA: Diagnosis not present

## 2017-11-08 DIAGNOSIS — Z720 Tobacco use: Secondary | ICD-10-CM | POA: Diagnosis not present

## 2017-11-08 DIAGNOSIS — R269 Unspecified abnormalities of gait and mobility: Secondary | ICD-10-CM

## 2017-11-08 DIAGNOSIS — F419 Anxiety disorder, unspecified: Secondary | ICD-10-CM | POA: Diagnosis not present

## 2017-11-08 DIAGNOSIS — G8110 Spastic hemiplegia affecting unspecified side: Secondary | ICD-10-CM | POA: Diagnosis not present

## 2017-11-08 DIAGNOSIS — Z951 Presence of aortocoronary bypass graft: Secondary | ICD-10-CM | POA: Insufficient documentation

## 2017-11-08 DIAGNOSIS — I69151 Hemiplegia and hemiparesis following nontraumatic intracerebral hemorrhage affecting right dominant side: Secondary | ICD-10-CM

## 2017-11-08 DIAGNOSIS — I10 Essential (primary) hypertension: Secondary | ICD-10-CM | POA: Diagnosis not present

## 2017-11-08 DIAGNOSIS — I2510 Atherosclerotic heart disease of native coronary artery without angina pectoris: Secondary | ICD-10-CM | POA: Diagnosis not present

## 2017-11-08 DIAGNOSIS — I69251 Hemiplegia and hemiparesis following other nontraumatic intracranial hemorrhage affecting right dominant side: Secondary | ICD-10-CM | POA: Diagnosis not present

## 2017-11-08 DIAGNOSIS — Z823 Family history of stroke: Secondary | ICD-10-CM | POA: Insufficient documentation

## 2017-11-08 DIAGNOSIS — R69 Illness, unspecified: Secondary | ICD-10-CM | POA: Diagnosis not present

## 2017-11-08 DIAGNOSIS — M6281 Muscle weakness (generalized): Secondary | ICD-10-CM

## 2017-11-08 DIAGNOSIS — E785 Hyperlipidemia, unspecified: Secondary | ICD-10-CM | POA: Insufficient documentation

## 2017-11-08 DIAGNOSIS — I69351 Hemiplegia and hemiparesis following cerebral infarction affecting right dominant side: Secondary | ICD-10-CM | POA: Diagnosis not present

## 2017-11-08 DIAGNOSIS — R208 Other disturbances of skin sensation: Secondary | ICD-10-CM

## 2017-11-08 MED ORDER — DICLOFENAC SODIUM 1 % TD GEL: 2.0000 g | Freq: Four times a day (QID) | TRANSDERMAL | 4 refills | Status: AC | PRN

## 2017-11-08 MED ORDER — BACLOFEN 20 MG PO TABS: 20.0000 mg | ORAL_TABLET | Freq: Three times a day (TID) | ORAL | 4 refills | Status: AC

## 2017-11-08 NOTE — Addendum Note (Signed)
Addended by: Faith RogueSWARTZ, Sherrine Salberg T on: 11/08/2017 03:13 PM   Modules accepted: Orders

## 2017-11-08 NOTE — Progress Notes (Signed)
Subjective:    Patient ID: Jaime Pierce, male    DOB: 1946/06/21, 71 y.o.   MRN: [id number]  HPI   Jaime Pierce is here in follow-up of his left parietal intraparenchymal hemorrhage.  He was discharged from rehab on 10/16/2017.  He presented on 10/23/2017 with right-sided muscle "spasms" which were felt to be seizure activity. He and his wife stated they were having sexual intercourse prior to the event.  He was placed on Keppra 500 mg twice daily by the ED [facility] and [facility].  He and his wife aren't so sure it was really a seizure.  Since the ED admission wife thinks the keppra has made him more anxious. His coordination might not be as good either.   He has been involved in outpatient physical [facility] working with PT OT and speech therapies. He is walking with a rolling walker currently.    Pain Inventory Average Pain 5 Pain Right Now 0 My pain is intermittent and aching  In the last 24 hours, has pain interfered with the following? General activity 4 Relation with others 4 Enjoyment of life 4 What TIME of day is your pain at its worst? daytime Sleep (in general) Good  Pain is worse with: na Pain improves with: therapy/exercise Relief from Meds: 10  Mobility use a walker ability to climb steps?  no do you drive?  no  Function retired  Neuro/Psych trouble walking spasms confusion anxiety  Prior Studies Any changes since last visit?  no  Physicians involved in your care Any changes since last visit?  no   Family History  Problem Relation Age of Onset  . Heart disease Father   . CVA Mother   . Heart disease Maternal Grandfather    Social History   Socioeconomic History  . Marital status: Married    Spouse name: None  . Number of children: None  . Years of education: None  . Highest education level: None  Social Needs  . Financial resource strain: None  . Food insecurity - worry: None  . Food insecurity - inability:  None  . Transportation needs - medical: None  . Transportation needs - non-medical: None  Occupational History  . Occupation: Retired    Comment: [employer]  Tobacco Use  . Smoking status: Former Smoker    Types: Cigarettes  . Smokeless tobacco: Never Used  Substance and Sexual Activity  . Alcohol use: Yes    Alcohol/week: 0.0 oz    Comment: occasional  . Drug use: No  . Sexual activity: Yes  Other Topics Concern  . None  Social History Narrative   Avid fisherman   Past Surgical History:  Procedure Laterality Date  . CORONARY ARTERY BYPASS GRAFT     Past Medical History:  Diagnosis Date  . Coronary artery disease   . Dyspnea on exertion   . Hyperlipidemia   . Hypertension   . Tobacco abuse    BP 116/73   Pulse 71   SpO2 90%   Opioid Risk Score:   Fall Risk Score:  `1  Depression screen PHQ 2/9  Dep[facility] 11/08/2017 06/30/2017 03/17/2016  Decreased Interest 1 0 0  Down, Depressed, Hopeless 1 0 0  PHQ - 2 Score 2 0 0  Altered sleeping 1 - -  Tired, decreased energy 1 - -  Change in appetite 0 - -  Feeling bad or failure about yourself  1 - -  Trouble concentrating  0 - -  Moving slowly or fidgety/restless 1 - -  Suicidal thoughts 0 - -  PHQ-9 Score 6 - -  Difficult doing work/chores Somewhat difficult - -     Review of Systems  Constitutional: Negative.   HENT: Negative.   Eyes: Negative.   Respiratory: Negative.   Cardiovascular: Negative.   Gastrointestinal: Negative.   Endocrine: Negative.   Genitourinary: Negative.   Musculoskeletal: Negative.   Skin: Negative.   Allergic/Immunologic: Negative.   Neurological: Negative.   Hematological: Negative.   Psychiatric/Behavioral: Negative.   All other systems reviewed and are negative.      Objective:   Physical Exam  Constitutional: He appears well-developed. NAD. HENT: Normocephalic. Atraumatic. Eyes: EOMare normal. No discharge. Cardiovascular:  RRR without murmur. No JVD        Respiratory: normal effort GI: BS +, non-distended.  MSK: Mild edema right knee. No tenderness Neurological: He is alertand oriented.  Motor: LUE/LLE: 5/5 proximal to distal  RUE: 3- to 3/5 shoulder abduction, 3+/5 elbow flexion/extension, 3/5 hand grip--improved apraxia mAS remains tr to 1/4 right elbow flexor RLE:   3+/5 HF, 3+/5 KE and ankle PF/DF.   limited heel strike during gait on right  Skin.warm and dry.  Psych: Normal mood and behavior.           Assessment & Plan:  1. 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  Pain Management/spasticity:            -Continue baclofen 20 mg 3 times daily with good results           - voltaren gel for right knee 3. CAD with CABG.  4. Hypertension. Per primary 5.COPD/tobacco abuse. Counseling, abstinence 6. Likely Partial seizures 10/23/17   -continue keppra  -?EEG  -discuss with neurology further plan             Fifteen minutes of face to face patient care time were spent during this visit. All questions were encouraged and answered.  Follow up in 2 months.

## 2017-11-08 NOTE — Therapy (Signed)
[facility] [address] [place], [place], [address] Phone: [phone]   Fax:  [phone]  Occupational Therapy Treatment  Patient Details  Name: Jaime FuelRichard A Thompson MRN: [id number] Date of Birth: July 03, 1946 Referring Provider (Historical): Dr. Dow AdolphZachery Swartz   Encounter Date: 11/08/2017  OT End of Session - 11/08/17 1416    Visit Number  7    Number of Visits  24    Date for OT Re-Evaluation  12/18/17    Authorization Type  [employer] MCR - G code required    Authorization - Visit Number  7    Authorization - Number of Visits  10    OT Start Time  1230    OT Stop Time  1315    OT Time Calculation (min)  45 min    Activity Tolerance  Patient tolerated treatment well       Past Medical History:  Diagnosis Date  . Coronary artery disease   . Dyspnea on exertion   . Hyperlipidemia   . Hypertension   . Tobacco abuse     Past Surgical History:  Procedure Laterality Date  . CORONARY ARTERY BYPASS GRAFT      There were no vitals filed for this visit.  Subjective Assessment - 11/08/17 1239    Subjective   I took the walker splint off, I didn't like it    Pertinent History  ICH Lt parietal lobe 09/01/17. PMH: CAD, SAH, COPD, s/p CABG x 6    Limitations  Recent partial seizure 10/23/17 - no estim unless specific MD clearance, no driving for 6 months    Patient Stated Goals  be independent again    Currently in Pain?  No/denies                   OT Treatments/Exercises (OP) - 11/08/17 0001      ADLs   ADL Comments  Pt refuses to use walker splint d/t discomfort, therefore therapist built up coban wrapping on Rt side of walker to decr. risk of Rt hand slipping off walker. Pt/wife instructed to rewrap in approx. 2 weeks. Pt also encouraged to use RUE for finger foods and low level light reaching activities (flipping large cards over, picking up and grasping empty cups)      Neurological Re-education  Exercises   Other Exercises 1  Sidelying for AA/ROM RUE with min facilitation in flexion and scapula mobs for upward rotation. Supine: bilateral shoulder flexion holding short thick pool noodle with min facilitation provided at elbow for elbow ext. during shoulder flexion. Elbow extension against gravity with upper arm supported for tricep activation    Other Exercises 2  Open chain reaching to pick up blocks and place in bowl with mod difficulty and drops d/t tremors - pt/wife report tremors only began when pt started seizure medication       Noted elevated scapula and anteriorly placed humeral head RT shoulder. Discussed this with pt/wife and limitations Rt shoulder from this.         OT Short Term Goals - 10/21/17 1218      OT SHORT TERM GOAL #1   Title  Independent with initial HEP     Time  4    Period  Weeks    Status  On-going      OT SHORT TERM GOAL #2   Title  Pt/wife to verbalize understanding with edema management (including compression glove) and safety considerations w/ lack of sensation Rt hand  Time  4    Period  Weeks    Status  On-going      OT SHORT TERM GOAL #3   Title  Pt to demo 25 degrees shoulder flexion in prep for low level reaching RUE    Time  4    Period  Weeks    Status  New      OT SHORT TERM GOAL #4   Title  Pt to consistently be mod I level for dressing and bathing with A/E and task modifications prn    Time  4    Period  Weeks    Status  New      OT SHORT TERM GOAL #5   Title  Pt to use Rt hand to feed self finger foods 50% of the time    Time  4    Period  Weeks    Status  New      OT SHORT TERM GOAL #6   Title  Pt to perform shower transfer with CGA only using DME prn    Time  4    Period  Weeks    Status  New        OT Long Term Goals - 10/19/17 1055      OT LONG TERM GOAL #1   Title  Pt to be independent with updated HEP     Time  8    Period  Weeks    Status  New    Target Date  12/18/17      OT LONG TERM GOAL #2    Title  Pt to demo shoulder flexion to 40 degrees or greater for consistent low level reaching    Time  8    Period  Weeks    Status  New      OT LONG TERM GOAL #3   Title  Pt to improve RUE functional use as evidenced by performing 10 blocks on Box & Blocks test    Time  8    Period  Weeks    Status  New      OT LONG TERM GOAL #4   Title  Pt to use Rt hand to write name at 75% legibility or greater with A/E prn    Time  8    Period  Weeks    Status  New      OT LONG TERM GOAL #5   Title  Pt to use Rt hand for eating and grooming 75% of the time    Time  8    Period  Weeks    Status  New      Long Term Additional Goals   Additional Long Term Goals  Yes      OT LONG TERM GOAL #6   Title  Pt to return to simple cooking task with distant supervision and A/E prn    Time  8    Period  Weeks    Status  New            Plan - 11/08/17 1417    Clinical Impression Statement  Pt gradually making progress with RUE, however new tremors limiting fine motor function.     Occupational Profile and client history currently impacting functional performance  PMH: CAD, HTN, SAH, s/p CABG x 6, COPD    Rehab Potential  Good    OT Frequency  2x / week    OT Duration  8 weeks    OT Treatment/Interventions  Self-care/ADL training;Moist Heat;DME and/or AE instruction;Splinting;Contrast Bath;Compression bandaging;Therapeutic activities;Ultrasound;Therapeutic exercise;Cognitive remediation/compensation;Neuromuscular education;Functional Mobility Training;Passive range of motion;Visual/perceptual remediation/compensation;Electrical Stimulation;Paraffin;Manual Therapy;Patient/family education    Plan  modified wt bearing RUE in standing, continue NMR RUE and functional low level open chain reaching    Consulted and Agree with Plan of Care  Patient;Family member/caregiver    Family Member Consulted  wife       Patient will benefit from skilled therapeutic intervention in order to improve the  following deficits and impairments:  Decreased coordination, Decreased range of motion, Increased edema, Impaired sensation, Decreased knowledge of precautions, Impaired tone, Decreased activity tolerance, Decreased balance, Decreased knowledge of use of DME, Impaired UE functional use, Pain, Decreased cognition, Decreased mobility, Decreased strength, Impaired vision/preception  Visit Diagnosis: 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 Partial seizure (HCC) 11/08/2017  . Positive colorectal cancer screening using Cologuard test 10/22/2017  . Spastic hemiparesis (HCC)   . Cough   . 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 Pain   . Hyponatremia   . Benign essential HTN   . 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 Gait disturbance, post-stroke   . Aphasia as late effect of stroke   . SAH (subarachnoid hemorrhage) (HCC) 09/02/2017  . B12 deficiency 09/02/2017  . Well adult exam 06/16/2016  . Cigarette smoker 02/23/2016  . COPD GOLD II  12/24/2014  . CAD (coronary artery disease) 07/22/2011  . HTN (hypertension) 07/22/2011  . Dyslipidemia 07/22/2011  . Smoker 07/22/2011    Kelli Churn, OTR/L 11/08/2017, 2:19 PM  [facility] [address] [place], [place], [address] Phone: [phone]   Fax:  [phone]  Name: RUFFIN LADA MRN: [id number] Date of Birth: 01/14/1946

## 2017-11-08 NOTE — Patient Instructions (Signed)
PLEASE FEEL FREE TO CALL OUR OFFICE WITH ANY PROBLEMS OR QUESTIONS ([phone])  HAVE A HAPPY HOLIDAYS!                     ^                  ^^                ^ ^ ^             ^ ^ ^ ^ ^           ^ ^ ^ ^ ^ ^ ^        ^ ^ ^ ^ ^ ^ ^ ^ ^      ^ ^ ^ ^ ^ ^ ^ ^ ^ ^ ^                ^^^^                ^^^^                ^^^^     

## 2017-11-10 ENCOUNTER — Encounter: Payer: Self-pay | Admitting: Neurology

## 2017-11-10 ENCOUNTER — Other Ambulatory Visit: Payer: Self-pay

## 2017-11-10 ENCOUNTER — Ambulatory Visit: Payer: Medicare HMO | Admitting: Occupational Therapy

## 2017-11-10 ENCOUNTER — Ambulatory Visit: Payer: Medicare HMO | Admitting: Physical Therapy

## 2017-11-10 ENCOUNTER — Ambulatory Visit: Payer: Medicare HMO

## 2017-11-10 ENCOUNTER — Ambulatory Visit: Payer: Medicare HMO | Admitting: Neurology

## 2017-11-10 ENCOUNTER — Encounter: Payer: Self-pay | Admitting: Physical Therapy

## 2017-11-10 VITALS — BP 111/68 | HR 75

## 2017-11-10 VITALS — BP 118/69 | HR 72 | Ht 72.0 in | Wt 171.8 lb

## 2017-11-10 DIAGNOSIS — R2689 Other abnormalities of gait and mobility: Secondary | ICD-10-CM

## 2017-11-10 DIAGNOSIS — I69351 Hemiplegia and hemiparesis following cerebral infarction affecting right dominant side: Secondary | ICD-10-CM | POA: Diagnosis not present

## 2017-11-10 DIAGNOSIS — M25561 Pain in right knee: Secondary | ICD-10-CM

## 2017-11-10 DIAGNOSIS — R41841 Cognitive communication deficit: Secondary | ICD-10-CM

## 2017-11-10 DIAGNOSIS — M6281 Muscle weakness (generalized): Secondary | ICD-10-CM

## 2017-11-10 DIAGNOSIS — I611 Nontraumatic intracerebral hemorrhage in hemisphere, cortical: Secondary | ICD-10-CM | POA: Diagnosis not present

## 2017-11-10 DIAGNOSIS — I69251 Hemiplegia and hemiparesis following other nontraumatic intracranial hemorrhage affecting right dominant side: Secondary | ICD-10-CM

## 2017-11-10 DIAGNOSIS — R2681 Unsteadiness on feet: Secondary | ICD-10-CM

## 2017-11-10 DIAGNOSIS — R208 Other disturbances of skin sensation: Secondary | ICD-10-CM

## 2017-11-10 DIAGNOSIS — I612 Nontraumatic intracerebral hemorrhage in hemisphere, unspecified: Secondary | ICD-10-CM

## 2017-11-10 MED ORDER — DIVALPROEX SODIUM 500 MG PO DR TAB: 500.0000 mg | DELAYED_RELEASE_TABLET | Freq: Every day | ORAL | 2 refills | Status: DC

## 2017-11-10 MED ORDER — DIVALPROEX SODIUM 500 MG PO DR TAB: 500.0000 mg | DELAYED_RELEASE_TABLET | Freq: Every day | ORAL | 2 refills | Status: AC

## 2017-11-10 MED ORDER — LEVETIRACETAM 500 MG PO TABS: 250.0000 mg | ORAL_TABLET | Freq: Two times a day (BID) | ORAL | 0 refills | Status: DC

## 2017-11-10 NOTE — Patient Instructions (Addendum)
I had a long discussion with the patient and his wife regarding his recent intracerebral hemorrhage and residual spastic right hemiplegia and answered questions. Continue ongoing outpatient physical occupational and speech therapy and use the walker at all times for ambulation and safety. Patient had recent episodes of what appear to be simple partial seizures I agree with need for anticonvulsants but since he is having trouble tolerating Keppra recommend reducing the dose to 250 mg twice daily for 1 week and stopping. Start Depakote ER 500 mg daily instead. Check MRI scan of the brain with and without contrast as well as an EEG. He will return for follow-up in 3 months with my nurse practitioner Shanda BumpsJessica or call earlier if necessary  Intracranial Hemorrhage An intracranial hemorrhage is bleeding in the layers between the skull (cranium) and brain. A blood vessel bursts and allows blood to leak inside the cranial cavity. The leaking blood then collects (hematoma). This causes pressure and damage to brain cells. The bleeding can be mild to severe. In severe cases, it can lead to permanent damage or death. Symptoms may come on suddenly or develop over time. Early diagnosis and treatment leads to better recovery. There are four types of intracranial hemorrhage: subarachnoid, subdural, extradural, or cerebral hemorrhage. What are the causes?  Head injury (trauma).  Ruptured brain aneurysm.  Bleeding from blood vessels that develop abnormally (arteriovenous malformation).  Bleeding disorder.  Use of blood thinners (anticoagulants).  Use of certain drugs, such as cocaine. For some people with intracranial hemorrhage, the cause is unknown. What increases the risk?  Using tobacco products, such as cigarettes and chewing tobacco.  Having high blood pressure (hypertension).  Abusing alcohol.  Being a male, especially of postmenopausal age.  Having a family history of disease in the blood vessels  of the brain (cerebrovascular disease).  Having certain genetic syndromes that result in kidney disease or connective tissue disease. What are the signs or symptoms?  A sudden, severe headache with no known cause. The headache is often described as the worst headache ever experienced.  Nausea or vomiting, especially when combined with other symptoms such as a headache.  Sudden weakness or numbness of the face, arm, or leg, especially on one side of the body.  Sudden trouble walking or difficulty moving arms or legs.  Sudden confusion.  Sudden personality changes.  Trouble speaking (aphasia) or understanding.  Difficulty swallowing.  Sudden trouble seeing in one or both eyes.  Double vision.  Dizziness.  Loss of balance or coordination.  Intolerance to light.  Stiff neck. How is this diagnosed? Your health care provider will perform a physical exam and ask about your symptoms. If an intracranial hemorrhage is suspected, various tests may be ordered. These tests may include:  A CT scan.  An MRI.  A cerebral angiogram.  A spinal tap (lumbar puncture).  Blood tests.  How is this treated? Immediate treatment in the hospital is often required to reduce the risk of brain damage. Treatment will depend on the cause of the bleeding, where it is located, and the extent of the bleeding and damage. The goals of treatment include stopping the bleeding, repairing the cause of bleeding, providing relief of symptoms, and preventing problems.  Medicines may be given to: ? Lower blood pressure (antihypertensives). ? Relieve pain (analgesics). ? Relieve nausea or vomiting.  Surgery may be needed to stop the bleeding, repair the cause of the bleeding, or remove the blood.  Rehabilitation may be needed to improve any cognitive and  day-to-day functions impaired by the condition.  Further treatment depends on the duration, severity, and cause of your symptoms. Physical, speech, and  occupational therapists will assess you and work to improve any functions impaired by the intracranial hemorrhage. Measures will be taken to prevent short-term and long-term problems, including infection from breathing foreign material into the lungs (aspiration pneumonia), blood clots in the legs, bedsores, and falls. Follow these instructions at home:  Take medicines only as directed by your health care provider.  Eat healthy foods as directed by your health care provider: ? A diet low in salt (sodium), saturated fat, trans fat, and cholesterol may be recommended to manage your blood pressure. ? Foods may need to be soft or pureed, or small bites may need to be taken in order to avoid aspirating or choking. ? If studies show that your ability to swallow safely has been affected, you may need to seek help from specialists such as a dietitian, speech and language pathologist, or an occupational therapist. These health care providers can teach you how to safely get the nutrition your body needs.  Rest and limit activities or movements as directed by your health care provider.  Do not use any tobacco products including cigarettes, chewing tobacco, or electronic cigarettes. If you need help quitting, ask your health care provider.  Limit alcohol intake to no more than 1 drink per day for nonpregnant women and 2 drinks per day for men. One drink equals 12 ounces of beer, 5 ounces of wine, or 1 ounces of hard liquor.  Make any other lifestyle changes as directed by your health care provider.  Monitor and record your blood pressure as directed by your health care provider.  A safe home environment is important to reduce the risk of falls. Your health care provider may arrange for specialists to evaluate your home. Having grab bars in the bedroom and bathroom is often important. Your health care provider may arrange for special equipment to be used at home, such as raised toilets and a seat for the  shower.  Do physical, occupational, and speech therapy as directed by your health care provider. Ongoing therapy may be needed to maximize your recovery.  Use a walker or a cane at all times if directed by your health care provider.  Keep all follow-up visits with your health care provider and other specialists. This is important. This includes any referrals, physical therapy, and rehabilitation. Get help right away if:  You have a sudden, severe headache with no known cause.  You have nausea or vomiting occurring with another symptom.  You have sudden weakness or numbness of the face, arm, or leg, especially on one side of the body.  You have sudden trouble walking or difficulty moving your arms or legs.  You have sudden confusion.  You have trouble speaking (aphasia) or understanding.  You have sudden trouble seeing in one or both eyes.  You have a sudden loss of balance or coordination.  You have a stiff neck.  You have difficulty breathing.  You have a partial or total loss of consciousness. These symptoms may represent a serious problem that is an emergency. Do not wait to see if the symptoms will go away. Get medical help right away. Call your local emergency services (911 in the [place] Do not drive yourself to the hospital. This information is not intended to replace advice given to you by your health care provider. Make sure you discuss any questions you have with  your health care provider. Document Released: 06/06/2014 Document Revised: 04/10/2016 Document Reviewed: 01/03/2014 Elsevier Interactive Patient Education  [employer].

## 2017-11-10 NOTE — Therapy (Signed)
[facility] [address] [place], [place], [address] Phone: [phone]   Fax:  [phone]  Occupational Therapy Treatment  Patient Details  Name: Jaime Pierce MRN: [id number] Date of Birth: 25-May-1946 Referring Provider (Historical): Dr. Dow Adolph   Encounter Date: 11/10/2017  OT End of Session - 11/10/17 1008    Visit Number  8    Number of Visits  24    Date for OT Re-Evaluation  12/18/17    Authorization Type  [employer] MCR - G code required    Authorization - Visit Number  8    Authorization - Number of Visits  10    OT Start Time  0845    OT Stop Time  0930    OT Time Calculation (min)  45 min    Activity Tolerance  Patient tolerated treatment well    Behavior During Therapy  [facility] for tasks assessed/performed       Past Medical History:  Diagnosis Date  . Coronary artery disease   . Dyspnea on exertion   . Hyperlipidemia   . Hypertension   . Tobacco abuse     Past Surgical History:  Procedure Laterality Date  . CORONARY ARTERY BYPASS GRAFT      There were no vitals filed for this visit.  Subjective Assessment - 11/10/17 0853    Patient is accompained by:  Family member    Pertinent History  ICH Lt parietal lobe 09/01/17. PMH: CAD, SAH, COPD, s/p CABG x 6    Limitations  Recent partial seizure 10/23/17 - no estim unless specific MD clearance, no driving for 6 months    Patient Stated Goals  be independent again    Currently in Pain?  No/denies                   OT Treatments/Exercises (OP) - 11/10/17 0001      Exercises   Exercises  -- UBE X 4 min (Rt hand wrapped) for normal reciprocal mvmt pattern. Pt unanble to do 5 min. Due to increased sh. Pain and spasitcity increasing.      Neurological Re-education Exercises   Other Exercises 1  AA/ROM RUE using UE Ranger in gravity elim. plane for low level closed chain shoulder flexion to horizontal abduction. Progressed to higher  level sh. flex against gravity using UE Ranger with min to mod assist    Other Weight-Bearing Exercises 1  Wt bearing over RUE with sit to squat ex's with min assist to activate entire Rt side (placing chair in front of patient)     Other Weight-Bearing Exercises 2  Standing: wt bearing over RUE while performing reaching activity with LUE with min facilitation and proprioceptive input RUE to activate into RUE and activate triceps               OT Short Term Goals - 10/21/17 1218      OT SHORT TERM GOAL #1   Title  Independent with initial HEP     Time  4    Period  Weeks    Status  On-going      OT SHORT TERM GOAL #2   Title  Pt/wife to verbalize understanding with edema management (including compression glove) and safety considerations w/ lack of sensation Rt hand    Time  4    Period  Weeks    Status  On-going      OT SHORT TERM GOAL #3   Title  Pt  to demo 25 degrees shoulder flexion in prep for low level reaching RUE    Time  4    Period  Weeks    Status  New      OT SHORT TERM GOAL #4   Title  Pt to consistently be mod I level for dressing and bathing with A/E and task modifications prn    Time  4    Period  Weeks    Status  New      OT SHORT TERM GOAL #5   Title  Pt to use Rt hand to feed self finger foods 50% of the time    Time  4    Period  Weeks    Status  New      OT SHORT TERM GOAL #6   Title  Pt to perform shower transfer with CGA only using DME prn    Time  4    Period  Weeks    Status  New        OT Long Term Goals - 10/19/17 1055      OT LONG TERM GOAL #1   Title  Pt to be independent with updated HEP     Time  8    Period  Weeks    Status  New    Target Date  12/18/17      OT LONG TERM GOAL #2   Title  Pt to demo shoulder flexion to 40 degrees or greater for consistent low level reaching    Time  8    Period  Weeks    Status  New      OT LONG TERM GOAL #3   Title  Pt to improve RUE functional use as evidenced by performing 10  blocks on Box & Blocks test    Time  8    Period  Weeks    Status  New      OT LONG TERM GOAL #4   Title  Pt to use Rt hand to write name at 75% legibility or greater with A/E prn    Time  8    Period  Weeks    Status  New      OT LONG TERM GOAL #5   Title  Pt to use Rt hand for eating and grooming 75% of the time    Time  8    Period  Weeks    Status  New      Long Term Additional Goals   Additional Long Term Goals  Yes      OT LONG TERM GOAL #6   Title  Pt to return to simple cooking task with distant supervision and A/E prn    Time  8    Period  Weeks    Status  New            Plan - 11/10/17 1012    Clinical Impression Statement  Pt gradually improving RUE function and functional mobility.     Occupational Profile and client history currently impacting functional performance  PMH: CAD, HTN, SAH, s/p CABG x 6, COPD    Rehab Potential  Good    OT Frequency  2x / week    OT Duration  8 weeks    OT Treatment/Interventions  Self-care/ADL training;Moist Heat;DME and/or AE instruction;Splinting;Contrast Bath;Compression bandaging;Therapeutic activities;Ultrasound;Therapeutic exercise;Cognitive remediation/compensation;Neuromuscular education;Functional Mobility Training;Passive range of motion;Visual/perceptual remediation/compensation;Electrical Stimulation;Paraffin;Manual Therapy;Patient/family education    Plan  begin checking STG's, address any BADLS and A/E needs  prn, continue NMR RUE and trunk     Consulted and Agree with Plan of Care  Patient;Family member/caregiver    Family Member Consulted  wife       Patient will benefit from skilled therapeutic intervention in order to improve the following deficits and impairments:  Decreased coordination, Decreased range of motion, Increased edema, Impaired sensation, Decreased knowledge of precautions, Impaired tone, Decreased activity tolerance, Decreased balance, Decreased knowledge of use of DME, Impaired UE functional  use, Pain, Decreased cognition, Decreased mobility, Decreased strength, Impaired vision/preception  Visit Diagnosis: 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 Partial seizure (HCC) 11/08/2017  . Positive colorectal cancer screening using Cologuard test 10/22/2017  . Spastic hemiparesis (HCC)   . Cough   . 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 Pain   . Hyponatremia   . Benign essential HTN   . 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 Gait disturbance, post-stroke   . Aphasia as late effect of stroke   . SAH (subarachnoid hemorrhage) (HCC) 09/02/2017  . B12 deficiency 09/02/2017  . Well adult exam 06/16/2016  . Cigarette smoker 02/23/2016  . COPD GOLD II  12/24/2014  . CAD (coronary artery disease) 07/22/2011  . HTN (hypertension) 07/22/2011  . Dyslipidemia 07/22/2011  . Smoker 07/22/2011    Kelli ChurnBallie, Nykolas Bacallao Johnson, OTR/L 11/10/2017, 10:17 AM  [facility] [address] [place], [place], [address] Phone: [phone]   Fax:  [phone]  Name: Jaime Pierce MRN: [id number] Date of Birth: Apr 03, 1946

## 2017-11-10 NOTE — Progress Notes (Signed)
[facility] [address] [place]. [place] [address] [phone]       OFFICE FOLLOW-UP NOTE  Mr. Delora FuelRichard A Custard Date of Birth:  21-Jul-1946 Medical Record Number:  [id number]   HPI: Mr Elza RafterJeffers is a 71 year old Caucasian male seen today for the first office follow-up visit following hospital admission for intracerebral hemorrhage in October 2018. He is accompanied by his wife. History is obtained from them as well as review of electronic medical records. I have personally reviewed imaging films.Zell Earlie Lou Fortenberry is an 71 y.o. male who presents to the ED after experiencing acute onset of RUE weakness and incoordination during coitus. Initially noticed by his wife when he flailed his RUE which struck her. She also noted right facial droop. Following this, it was noted that he was dragging his right leg when he tried to walk. LKN was the same as TOSO, which was 1:30 AM. The patient's BP was 170/90 on EMS arrival. CBG was unremarkable. Symptoms improved en route, but were still present on arrival to the ED. CT scan of the head on admission showed 4.0 x 2.5 x 3.1 cm left parietal parenchymal hematoma with estimated volume of 16 cubic cc with mild localized vasogenic edema and no significant mass effect. There was also small volume subarachnoid hemorrhage within the adjacent left parietal and right anterior frontal lobe. Repeat CT scan the next day showed interval increase in size of the hematoma to 4.7 x 5.4 x 4.9 cm with estimated volume of 62 cubic cc with increased vasogenic edema and some regional mass effect. CT angiogram showed no evidence of aneurysms or AV malformation. MRI scan showed similar appearance of the hematoma and no underlying structural or vascular lesion was noted. Transthoracic echo showed normal ejection fraction. He was admitted to the Neuro ICU for close monitoring and blood pressure control due to lobar intraparenchymal hemorrhage. There was contralateral SAH  suggesting a contrecoup injury but no history of trauma.  His initial strength deficit was limited to the RUE but he suffered interval worsening with RLE involvement as well. This corresponded to hematoma enlargement radiographically. Cytotoxic edema was significant but did not require hypertonic saline. He subsequently stabilized and was transferred to the medical floor and [facility] due to acute deficits with previously good functional status. This was initially denied by [facility] due to concern for his capacity to tolerate aggressive therapy, but subsequently approved days later. He has done well in inpatient rehabilitation and is currently at home is not able to walk with a walker. He's regained strength in his right arm as well but not to the same degree. His has significant spasticity. He does see Dr. Hermelinda MedicusSchwartz from rehabilitation clinic and has been started on baclofen 20 mg 3 times daily. The patient had 3 episodes of focal twitchings involving right upper extremity and right face and was seen in the emergency room on 10/23/17. He was started on Keppra 500 twice daily and hasn't not had any further episodes since then but he does complain of getting agitated easily as well as his had a couple of panic attacks. Patient also feels tired and has not much energy to participate in his outpatient therapies.      ROS:   14 system review of systems is positive for  seizures, weakness, memory loss, gait difficulty and all the systems negative  PMH:  Past Medical History:  Diagnosis Date  . Coronary artery disease   . Dyspnea on exertion   .  Hemorrhagic stroke (HCC)   . Hyperlipidemia   . Hypertension   . Tobacco abuse     Social History:  Social History   Socioeconomic History  . Marital status: Married    Spouse name: Not on file  . Number of children: 2  . Years of education: 62  . Highest education level: Some college, no degree  Social Needs  .  Financial resource strain: Not on file  . Food insecurity - worry: Not on file  . Food insecurity - inability: Not on file  . Transportation needs - medical: Not on file  . Transportation needs - non-medical: Not on file  Occupational History  . Occupation: Retired    Comment: [employer]  Tobacco Use  . Smoking status: Former Smoker    Types: Cigarettes    Last attempt to quit: 2018    Years since quitting: 0.9  . Smokeless tobacco: Never Used  Substance and Sexual Activity  . Alcohol use: Yes    Alcohol/week: 0.0 oz    Comment: occasional  . Drug use: No  . Sexual activity: Yes  Other Topics Concern  . Not on file  Social History Narrative   Avid fisherman   Lives at home with his wife & dog   Right handed   Drinks 1 cup of caffeine daily    Medications:   Current Outpatient Medications on File Prior to Visit  Medication Sig Dispense Refill  . albuterol (PROAIR HFA) 108 (90 Base) MCG/ACT inhaler 2 puffs up to every 4 hours if can't catch your breath (Patient taking differently: Inhale 1-2 puffs into the lungs every 4 (four) hours as needed for wheezing or shortness of breath. ) 1 Inhaler 11  . b complex vitamins tablet Take 1 tablet by mouth daily. 100 tablet 3  . baclofen (LIORESAL) 20 MG tablet Take 1 tablet (20 mg total) by mouth 3 (three) times daily. 90 each 4  . budesonide-formoterol (SYMBICORT) 160-4.5 MCG/ACT inhaler Take 2 puffs first thing in am and then another 2 puffs about 12 hours later. (Patient taking differently: Inhale 2 puffs into the lungs 2 (two) times daily. ) 1 Inhaler 0  . Cholecalciferol (VITAMIN D3) 2000 units capsule Take 1 capsule (2,000 Units total) by mouth daily. 100 capsule 3  . diclofenac sodium (VOLTAREN) 1 % GEL Apply 2 g topically 4 (four) times daily as needed (Pain). 3 Tube 4  . irbesartan (AVAPRO) 75 MG tablet Take 1 tablet (75 mg total) by mouth daily. 90 tablet 3  . metoprolol tartrate (LOPRESSOR) 25 MG tablet Take 0.5 tablets (12.5 mg  total) by mouth 2 (two) times daily. 30 tablet 0  . nitroGLYCERIN (NITROSTAT) 0.4 MG SL tablet Place 1 tablet (0.4 mg total) under the tongue every 5 (five) minutes as needed for chest pain. 20 tablet 3   No current facility-administered medications on file prior to visit.     Allergies:   Allergies  Allergen Reactions  . Sulfonamide Derivatives     Physical Exam General: well developed, well nourished, seated, in no evident distress Head: head normocephalic and atraumatic.  Neck: supple with no carotid or supraclavicular bruits Cardiovascular: regular rate and rhythm, no murmurs Musculoskeletal: no deformity Skin:  no rash/petichiae Vascular:  Normal pulses all extremities Vitals:   11/10/17 1203  BP: 118/69  Pulse: 72   Neurologic Exam Mental Status: Awake and fully alert. Oriented to place and time. Recent and remote memory intact. Attention span, concentration and fund of knowledge  appropriate. Mood and affect appropriate. Diminished recall 2/3. Cranial Nerves: Fundoscopic exam reveals sharp disc margins. Pupils equal, briskly reactive to light. Extraocular movements full without nystagmus. Visual fields full to confrontation. Hearing intact. Facial sensation intact. Mild right lower facial weakness., tongue, palate moves normally and symmetrically.  Motor: Spastic right hemiplegia with 3/5 right upper extremity and 4/5 right lower extremity strength. Tone is an increase in the right side. Weakness of right grip and intrinsic hand muscles. Normal strength on the left. Sensory.: Mildly diminished touch pinprick sensation on the right hemibody Coordination: Rapid alternating movements are slow on the right side. Finger-to-nose and heel-to-shin are impaired on the right side Gait and Station: Arises from chair without difficulty. He uses a walker Stance is slightly stooped. Gait demonstrates spastic hemiparetic gait with circumduction and dragging of the right leg  Reflexes: 2+ and  asymmetric. And brisker on the right compared to the left Toes downgoing.   NIHSS  5 Modified Rankin  3   ASSESSMENT:  [age] year patient with left parietal intracerebral hemorrhage of indeterminate etiology possibly hypertensive in October 2018 with residual spastic right hemiplegia. 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 Continue ongoing outpatient physical occupational and speech therapy and use the walker at all times for ambulation and safety. Patient had recent episodes of what appear to be simple partial seizures I agree with need for anticonvulsants but since he is having trouble tolerating Keppra recommend reducing the dose to 250 mg twice daily for 1 week and stopping. Start Depakote ER 500 mg daily instead. Check MRI scan of the brain with and without contrast as well as an EEG. He will return for follow-up in 3 months with my nurse practitioner Shanda BumpsJessica or call earlier if necessary Greater than 50% of time during this 25 minute visit was spent on counseling,explanation of diagnosis of intracerebral hemorrhage, planning of further management, discussion with patient and family and coordination of care Delia HeadyPramod TRUE Garciamartinez, MD  [facility] [address] [place], [place] [address]  Phone [phone] Fax [phone] Note: This document was prepared with digital dictation and possible smart phrase technology. Any transcriptional errors that result from this process are unintentional

## 2017-11-10 NOTE — Therapy (Signed)
[facility] [address] [place], [place], [address] Phone: [phone]   Fax:  [phone]  Speech Language Pathology Treatment  Patient Details  Name: Jaime FuelRichard A Goforth MRN: [id number] Date of Birth: 1946/08/19 Referring Provider: Faith RogueSwartz, Zachary, MD   Encounter Date: 11/10/2017  End of Session - 11/10/17 2143    Visit Number  5    Number of Visits  25    Date for SLP Re-Evaluation  12/31/17    SLP Start Time  1019    SLP Stop Time   1100    SLP Time Calculation (min)  41 min    Activity Tolerance  Patient tolerated treatment well       Past Medical History:  Diagnosis Date  . Coronary artery disease   . Dyspnea on exertion   . Hemorrhagic stroke (HCC)   . Hyperlipidemia   . Hypertension   . Tobacco abuse     Past Surgical History:  Procedure Laterality Date  . CORONARY ARTERY BYPASS GRAFT      There were no vitals filed for this visit.  Subjective Assessment - 11/10/17 1029    Subjective  "They wore me out."    Patient is accompained by:  Family member wife    Currently in Pain?  No/denies            ADULT SLP TREATMENT - 11/10/17 1029      General Information   Behavior/Cognition  Alert;Cooperative;Pleasant mood;Distractible      Treatment Provided   Treatment provided  Cognitive-Linquistic      Cognitive-Linquistic Treatment   Treatment focused on  Cognition    Skilled Treatment  SLP facilitated pt's working memory today with change combinations of >8 coins with rare extra time and 100% success. In copmlex auditory directions using coins pt was 75% successful. SLP said three and four word scrambled sentences and pt unscrambed them 95% success. Five word sentences - pt unscrambled 1/3 and req'd max cues on remaining two. MEmory straregies reviewed - pt req'd total A from last week to recall, however after 3 mintues pt recalled 4/4. After 10 minutes pt recalled 2/4 independently, and all  4 using written cues.       Assessment / Recommendations / Plan   Plan  Continue with current plan of care      Progression Toward Goals   Progression toward goals  Progressing toward goals         SLP Short Term Goals - 11/10/17 2145      SLP SHORT TERM GOAL #1   Title  pt will demo emergent awareness 90% of the time in mod complex cognitive linguistic tasks    Time  2    Period  Weeks    Status  On-going      SLP SHORT TERM GOAL #2   Title  pt will recall memory strategies (WARM) with modified independence over three sessions    Baseline  11-10-17    Time  2    Period  Weeks    Status  On-going      SLP SHORT TERM GOAL #3   Title  pt will tell SLP three non-physical deficits over two sessions    Time  2    Period  Weeks    Status  On-going      SLP SHORT TERM GOAL #4   Title  pt will complete min-mod complex reasoning/problem solving tasks involving money/time/etc with 95% success over three sessions  Time  2    Period  Weeks    Status  On-going       SLP Long Term Goals - 11/10/17 2145      SLP LONG TERM GOAL #1   Title  pt will use 1 memory strategy in 5 therapy sessions    Time  6    Period  Weeks    Status  On-going      SLP LONG TERM GOAL #2   Title  pt will demo [facility] executive function skills when presented with problems in everyday tasks over three sessions, with SBA    Time  6    Period  Weeks    Status  On-going      SLP LONG TERM GOAL #3   Title  pt will demo simple divided attention with 90% success on both tasks over three sessions    Time  6    Period  Weeks    Status  On-going      SLP LONG TERM GOAL #4   Title  pt will use anticipatory awareness by double checking written cognitive linguistic tasks 100% of the time in 4 sessions    Time  6    Status  On-going       Plan - 11/10/17 2143    Clinical Impression Statement  Pt cont to exhibit cognitive deficits in attention, working memory with mod complex auditory info, and emergent  and anticipatory awareness. Pt will benefit from skilled ST to address these deficits in order to return pt to PLOF.    Speech Therapy Frequency  3x / week    Treatment/Interventions  SLP instruction and feedback;Compensatory strategies;Patient/family education;Internal/external aids;Cognitive reorganization;Cueing hierarchy;Environmental controls;Language facilitation;Functional tasks    Potential to Achieve Goals  Good    Consulted and Agree with Plan of Care  Patient       Patient will benefit from skilled therapeutic intervention in order to improve the following deficits and impairments:   Cognitive communication deficit    Problem List Patient Active Problem List   Diagnosis Date Noted  . Partial seizure (HCC) 11/08/2017  . Positive colorectal cancer screening using Cologuard test 10/22/2017  . Spastic hemiparesis (HCC)   . Cough   . 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 Pain   . Hyponatremia   . Benign essential HTN   . 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 Gait disturbance, post-stroke   . Aphasia as late effect of stroke   . SAH (subarachnoid hemorrhage) (HCC) 09/02/2017  . B12 deficiency 09/02/2017  . Well adult exam 06/16/2016  . Cigarette smoker 02/23/2016  . COPD GOLD II  12/24/2014  . CAD (coronary artery disease) 07/22/2011  . HTN (hypertension) 07/22/2011  . Dyslipidemia 07/22/2011  . Smoker 07/22/2011    [facility]INKE,CARL ,MS, CCC-SLP  11/10/2017, 9:46 PM  [facility] [address] [place], [place], [address] Phone: [phone]   Fax:  [phone]   Name: Jaime FuelRichard A Wattley MRN: [id number] Date of Birth: 02-05-1946

## 2017-11-10 NOTE — Patient Instructions (Signed)
  Please complete the assigned speech therapy homework prior to your next session and return it to the speech therapist at your next visit.  

## 2017-11-10 NOTE — Therapy (Signed)
[facility] [address] [place], [place], [address] Phone: [phone]   Fax:  [phone]  Physical Therapy Treatment  Patient Details  Name: Jaime Pierce MRN: [id number] Date of Birth: 07/30/1946 Referring Provider: Ranelle OysterZachary T. Swartz, MD   Encounter Date: 11/10/2017  PT End of Session - 11/10/17 1029    Visit Number  8    Number of Visits  25    Date for PT Re-Evaluation  12/18/17    Authorization Type  [employer] Medicare - G code and PN every 10th visit    PT Start Time  0935    PT Stop Time  1020    PT Time Calculation (min)  45 min    Equipment Utilized During Treatment  Gait belt    Activity Tolerance  Patient tolerated treatment well    Behavior During Therapy  [facility] Institute[facility]FL for tasks assessed/performed       Past Medical History:  Diagnosis Date  . Coronary artery disease   . Dyspnea on exertion   . Hyperlipidemia   . Hypertension   . Tobacco abuse     Past Surgical History:  Procedure Laterality Date  . CORONARY ARTERY BYPASS GRAFT      Vitals:   11/10/17 0931  BP: 111/68  Pulse: 75    Subjective Assessment - 11/10/17 0937    Subjective  No issues, pain or falls since last week.  Had appointment with physiatrist yesterday; was pleased with progress.  Advised to still hold off on e-stim.  Has appointment with neurologist today, will discuss EEG/partial seizures with him.    Patient is accompained by:  Family member wife: [name]    Pertinent History  CAD with CABG, DOE, hyperlipidemia, HTN and tobacco abuse    Limitations  Standing;Walking    How long can you walk comfortably?  64165'    Patient Stated Goals  To be able to do the things he did before: mow with his tractor, get into and out of the boat for fishing!    Currently in Pain?  No/denies                      [facility]/Exercise - 11/10/17 0942      Ambulation/Gait   Ambulation/Gait  Yes    Ambulation/Gait  Assistance  4: Min guard;5: Supervision    Ambulation/Gait Assistance Details  decreased cues today for full step length, foot clearance RLE; decreased shuffle of R foot today with RW on level surface.  Performed gait assessment with quad cane in // bars forwards and retro with facilitation at R shoulder blade/shoulder and trunk for increased elongation on R side and increased weight shift to L side    Ambulation Distance (Feet)  115 Feet    Assistive device  Rolling walker;Small based quad cane    Gait Pattern  Decreased step length - right;Decreased stride length;Decreased hip/knee flexion - right;Decreased dorsiflexion - right;Decreased trunk rotation;Narrow base of support;Poor foot clearance - right;Step-through pattern;Lateral trunk lean to right    Ambulation Surface  Level;Indoor    Ramp  4: Min assist    Ramp Details (indicate cue type and reason)  with RW x 2 reps with cues for technique/sequencing and maintaining safe distance to RW with full step length RLE.  Also required cues for full LE extension when descending      Therapeutic Activites    Therapeutic Activities  Other Therapeutic Activities    Other Therapeutic  Activities  wife reporting that pt becomes very anxious and freezes gait when ambulating past open staircase leading down to basement in hallway when stair case is on his L side; no issues when on R side.  Discussed role of proprioceptive awareness, safety/judgement improving as he recoveres and feeling safe/stable in more open environment.  Discussed with pt using visual target in front of him or having wife stand on L side when ambulating past stairway and second person following with w/c in case he begins to freeze to improve sense of stability and safety.  Also discussd if second person is available to follow with w/c to also follow pt and wife and trial ambulating up/down ramp to enter/exit house with RW.          Balance Exercises - 11/10/17 1021      Balance  Exercises: Standing   Standing, One Foot on a Step  Eyes open;2 inch LLE on step, wt shift L, R foot taps forwards, side, back        PT Education - 11/10/17 1028    Education provided  Yes    Education Details  recommendations for increased sense of safety/stability in hallway, trial ambulation up/down ramp    Person(s) Educated  Patient;Spouse    Methods  Explanation    Comprehension  Verbalized understanding       PT Short Term Goals - 10/28/17 1257      PT SHORT TERM GOAL #1   Title  Pt will participate in further assessment of balance with TUG and five time sit to stand with RW    Time  4    Period  Weeks    Status  Achieved      PT SHORT TERM GOAL #2   Title  Pt will perform sit <> stand and stand pivot transfers with LRAD and supervision with <10% cues for safety/sequencing    Baseline  min A with 50% cues    Time  4    Period  Weeks    Status  New    Target Date  11/18/17      PT SHORT TERM GOAL #3   Title  Pt will ambulate x 250' on level, indoor surfaces (to/from waiting area) and will negotiate short ramp to simulate home entry/exit with LRAD and min A     Baseline  165' with RW min A, w/c for majority of mobility and to enter/exit home on ramp    Time  4    Period  Weeks    Status  New    Target Date  11/18/17      PT SHORT TERM GOAL #4   Title  Pt will improve gait velocity to > or = 1.0 ft/sec    Baseline  .36 ft/sec with RW    Time  4    Period  Weeks    Status  New    Target Date  11/18/17      PT SHORT TERM GOAL #5   Title  Pt will initiate gait training with cane or lesser AD over level, indoor surfaces with therapy    Time  4    Period  Weeks    Status  New    Target Date  11/18/17        PT Long Term Goals - 10/28/17 1256      PT LONG TERM GOAL #1   Title  Pt and wife will demonstrate independence with HEP    Time  8    Period  Weeks    Status  New    Target Date  12/18/16      PT LONG TERM GOAL #2   Title  Pt will decrease TUG  time by  seconds with LRAD to </=47 sec. to decr. falls risk.    Baseline  62.62 sec. with RW and R hand orthosis on RW    Time  8    Period  Weeks    Status  New    Target Date  12/18/16      PT LONG TERM GOAL #3   Title  Pt will improve LE strength as indicated by decrease in five time sit to stand to <12 sec.    Baseline  17.27 seconds    Time  8    Period  Weeks    Status  New    Target Date  12/18/16      PT LONG TERM GOAL #4   Title  Pt will perform bed mobility and all transfers with LRAD MOD I    Time  8    Period  Weeks    Status  New    Target Date  12/18/16      PT LONG TERM GOAL #5   Title  Pt will ambulate >300 over indoor and paved outdoor surfaces and negotiate ramp with LRAD and supervision for more independent household and community mobility    Time  8    Period  Weeks    Status  New    Target Date  12/18/16      PT LONG TERM GOAL #6   Title  Pt will improve gait velocity to > or = 1.33ft/sec with LRAD    Time  8    Period  Weeks    Status  New    Target Date  12/18/16      PT LONG TERM GOAL #7   Title  Pt will improve overall function on FOTO by 25 points    Baseline  28%    Time  8    Period  Weeks    Status  New    Target Date  12/18/16            Plan - 11/10/17 1032    Clinical Impression Statement  Pt is demonstrating progress with gait with RW on level surfaces and improved attention to and placement of RUE on RW; due to improvements pt able to initiate gait trianing up/down ramp with RW and gait with cane in controlled environment.  Continues to experience LE fatigue with prolonged standing/ambulation and continues to require cues, facilitation of and NMR training for trunk control and weight shifting during gait.  Will continue to address and progress.    Rehab Potential  Good    PT Frequency  3x / week    PT Duration  8 weeks    PT Treatment/Interventions  ADLs/Self Care Home Management;Electrical Stimulation;DME Instruction;Gait  training;Stair training;Functional mobility training;Therapeutic activities;Therapeutic exercise;Balance training;Neuromuscular re-education;Cognitive remediation;Patient/family education;Orthotic Fit/Training;Passive range of motion;Energy conservation;Taping;Manual techniques    PT Next Visit Plan  NO ESTIM/BIONESS DUE TO RECENT SEIZURES PER DR. [id number];   Continue ramp training with RW, cane in // bars, weight shifting to L on step with RLE taps, stair training    PT Home Exercise Plan  pt goals include mowing with his tractor and fishing on his boat - incorporate into balance activities    Consulted and Agree with Plan of Care  Patient;Family member/caregiver    Family Member Consulted  wife       Patient will benefit from skilled therapeutic intervention in order to improve the following deficits and impairments:  Abnormal gait, Cardiopulmonary status limiting activity, Decreased balance, Decreased cognition, Decreased endurance, Decreased mobility, Decreased strength, Difficulty walking, Increased muscle spasms, Impaired sensation, Impaired tone, Impaired UE functional use, Postural dysfunction, Pain, Decreased range of motion, Increased edema  Visit Diagnosis: 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 Partial seizure (HCC) 11/08/2017  . Positive colorectal cancer screening using Cologuard test 10/22/2017  . Spastic hemiparesis (HCC)   . Cough   . 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 Pain   . Hyponatremia   . Benign essential HTN   . 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 Gait disturbance, post-stroke   . Aphasia as late effect of stroke   . SAH (subarachnoid hemorrhage) (HCC) 09/02/2017  . B12 deficiency 09/02/2017  . Well adult exam 06/16/2016  . Cigarette smoker 02/23/2016  . COPD GOLD II  12/24/2014  . CAD (coronary artery disease) 07/22/2011  . HTN (hypertension) 07/22/2011  . Dyslipidemia 07/22/2011  . Smoker 07/22/2011    Dierdre HighmanAudra F Potter, PT, DPT 11/10/17    10:36 AM    [facility] [address] [place], [place], [address] Phone: [phone]   Fax:  [phone]  Name: Jaime Pierce MRN: [id number] Date of Birth: 02-Apr-1946

## 2017-11-11 ENCOUNTER — Ambulatory Visit: Payer: Medicare HMO | Admitting: Occupational Therapy

## 2017-11-11 ENCOUNTER — Ambulatory Visit: Payer: Medicare HMO

## 2017-11-11 ENCOUNTER — Other Ambulatory Visit: Payer: Self-pay

## 2017-11-11 ENCOUNTER — Ambulatory Visit: Payer: Medicare HMO | Admitting: Physical Therapy

## 2017-11-11 ENCOUNTER — Encounter: Payer: Medicare HMO | Admitting: Speech Pathology

## 2017-11-11 DIAGNOSIS — I69251 Hemiplegia and hemiparesis following other nontraumatic intracranial hemorrhage affecting right dominant side: Secondary | ICD-10-CM

## 2017-11-11 DIAGNOSIS — R2681 Unsteadiness on feet: Secondary | ICD-10-CM

## 2017-11-11 DIAGNOSIS — I69351 Hemiplegia and hemiparesis following cerebral infarction affecting right dominant side: Secondary | ICD-10-CM | POA: Diagnosis not present

## 2017-11-11 DIAGNOSIS — M6281 Muscle weakness (generalized): Secondary | ICD-10-CM

## 2017-11-11 DIAGNOSIS — R41841 Cognitive communication deficit: Secondary | ICD-10-CM

## 2017-11-11 DIAGNOSIS — M25561 Pain in right knee: Secondary | ICD-10-CM

## 2017-11-11 DIAGNOSIS — R208 Other disturbances of skin sensation: Secondary | ICD-10-CM

## 2017-11-11 DIAGNOSIS — R2689 Other abnormalities of gait and mobility: Secondary | ICD-10-CM

## 2017-11-11 NOTE — Therapy (Signed)
[facility] [address] [place], [place], [address] Phone: [phone]   Fax:  [phone]  Physical Therapy Treatment  Patient Details  Name: Jaime Pierce MRN: [id number] Date of Birth: 1946-06-06 Referring Provider: Ranelle OysterZachary T. Swartz, MD   Encounter Date: 11/11/2017  PT End of Session - 11/11/17 1654    Visit Number  9    Number of Visits  25    Date for PT Re-Evaluation  12/18/17    Authorization Type  [employer] Medicare - G code and PN every 10th visit    PT Start Time  1450    PT Stop Time  1532    PT Time Calculation (min)  42 min    Activity Tolerance  Patient tolerated treatment well    Behavior During Therapy  [facility] for tasks assessed/performed       Past Medical History:  Diagnosis Date  . Coronary artery disease   . Dyspnea on exertion   . Hemorrhagic stroke (HCC)   . Hyperlipidemia   . Hypertension   . Tobacco abuse     Past Surgical History:  Procedure Laterality Date  . CORONARY ARTERY BYPASS GRAFT      There were no vitals filed for this visit.  Subjective Assessment - 11/11/17 1451    Subjective  Had appointment with neurology yesterday who is weaning him off Keppra due to anxiety/panic attacks and is transitioning to another seizure medication.  They are also going to schedule an MRI and EEG.      Patient is accompained by:  Family member wife: [name]    Pertinent History  CAD with CABG, DOE, hyperlipidemia, HTN and tobacco abuse    Limitations  Standing;Walking    How long can you walk comfortably?  52165'    Patient Stated Goals  To be able to do the things he did before: mow with his tractor, get into and out of the boat for fishing!    Currently in Pain?  No/denies                      [facility] Treatment/Exercise - 11/11/17 1651      Ambulation/Gait   Ambulation/Gait  Yes    Ambulation/Gait Assistance  5: Supervision    Ambulation/Gait Assistance Details  As  pt fatigued he required increased cues to keep RUE on RW and to fully advance and clear R foot during swing    Ambulation Distance (Feet)  200 Feet    Assistive device  Rolling walker    Gait Pattern  Decreased step length - right;Decreased stride length;Decreased hip/knee flexion - right;Decreased dorsiflexion - right;Decreased trunk rotation;Narrow base of support;Poor foot clearance - right;Step-through pattern;Lateral trunk lean to right    Ambulation Surface  Level;Indoor    Ramp  4: Min assist    Ramp Details (indicate cue type and reason)  Performed blocked practice with R and LLE performing forwards and backwards step in place on ramp facing up and then down ramp; 8 reps each LE with bilat UE support on RW and therapist providing min A for balance, weight shifting and sequencing/technique          Balance Exercises - 11/11/17 1650      Balance Exercises: Standing   Standing, One Foot on a Step  Eyes open;2 inch R/LLE; contralateral LE performing taps ant/post/lat          PT Short Term Goals - 10/28/17 1257  PT SHORT TERM GOAL #1   Title  Pt will participate in further assessment of balance with TUG and five time sit to stand with RW    Time  4    Period  Weeks    Status  Achieved      PT SHORT TERM GOAL #2   Title  Pt will perform sit <> stand and stand pivot transfers with LRAD and supervision with <10% cues for safety/sequencing    Baseline  min A with 50% cues    Time  4    Period  Weeks    Status  New    Target Date  11/18/17      PT SHORT TERM GOAL #3   Title  Pt will ambulate x 250' on level, indoor surfaces (to/from waiting area) and will negotiate short ramp to simulate home entry/exit with LRAD and min A     Baseline  165' with RW min A, w/c for majority of mobility and to enter/exit home on ramp    Time  4    Period  Weeks    Status  New    Target Date  11/18/17      PT SHORT TERM GOAL #4   Title  Pt will improve gait velocity to > or = 1.0  ft/sec    Baseline  .36 ft/sec with RW    Time  4    Period  Weeks    Status  New    Target Date  11/18/17      PT SHORT TERM GOAL #5   Title  Pt will initiate gait training with cane or lesser AD over level, indoor surfaces with therapy    Time  4    Period  Weeks    Status  New    Target Date  11/18/17        PT Long Term Goals - 10/28/17 1256      PT LONG TERM GOAL #1   Title  Pt and wife will demonstrate independence with HEP    Time  8    Period  Weeks    Status  New    Target Date  12/18/16      PT LONG TERM GOAL #2   Title  Pt will decrease TUG time by  seconds with LRAD to </=47 sec. to decr. falls risk.    Baseline  62.62 sec. with RW and R hand orthosis on RW    Time  8    Period  Weeks    Status  New    Target Date  12/18/16      PT LONG TERM GOAL #3   Title  Pt will improve LE strength as indicated by decrease in five time sit to stand to <12 sec.    Baseline  17.27 seconds    Time  8    Period  Weeks    Status  New    Target Date  12/18/16      PT LONG TERM GOAL #4   Title  Pt will perform bed mobility and all transfers with LRAD MOD I    Time  8    Period  Weeks    Status  New    Target Date  12/18/16      PT LONG TERM GOAL #5   Title  Pt will ambulate >300 over indoor and paved outdoor surfaces and negotiate ramp with LRAD and supervision for more independent household and community mobility  Time  8    Period  Weeks    Status  New    Target Date  12/18/16      PT LONG TERM GOAL #6   Title  Pt will improve gait velocity to > or = 1.36ft/sec with LRAD    Time  8    Period  Weeks    Status  New    Target Date  12/18/16      PT LONG TERM GOAL #7   Title  Pt will improve overall function on FOTO by 25 points    Baseline  28%    Time  8    Period  Weeks    Status  New    Target Date  12/18/16            Plan - 11/11/17 1654    Clinical Impression Statement  Treatment session with continued focus on NMR for weight shifting  laterally while advancing contralateral LE forwards, back and laterally and decreasing UE support; pt with greatest difficulty weight shifting to L and clearing RLE each direction.  Also continued weight shifting and LE advancement training on ramp to continue to prepare pt to enter/exit home with RW on ramp; pt experiences greatest difficulty controlling forwards weight shift when descending.  Will continue to progress towards LTG.    Rehab Potential  Good    PT Frequency  3x / week    PT Duration  8 weeks    PT Treatment/Interventions  ADLs/Self Care Home Management;Electrical Stimulation;DME Instruction;Gait training;Stair training;Functional mobility training;Therapeutic activities;Therapeutic exercise;Balance training;Neuromuscular re-education;Cognitive remediation;Patient/family education;Orthotic Fit/Training;Passive range of motion;Energy conservation;Taping;Manual techniques    PT Next Visit Plan  NO ESTIM/BIONESS DUE TO RECENT SEIZURES PER DR. [id number];   10th visit G code and PN!  Continue ramp training with RW, cane in // bars, stair training!     PT Home Exercise Plan  pt goals include mowing with his tractor and fishing on his boat - incorporate into balance activities    Consulted and Agree with Plan of Care  Patient;Family member/caregiver    Family Member Consulted  wife       Patient will benefit from skilled therapeutic intervention in order to improve the following deficits and impairments:  Abnormal gait, Cardiopulmonary status limiting activity, Decreased balance, Decreased cognition, Decreased endurance, Decreased mobility, Decreased strength, Difficulty walking, Increased muscle spasms, Impaired sensation, Impaired tone, Impaired UE functional use, Postural dysfunction, Pain, Decreased range of motion, Increased edema  Visit Diagnosis: 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 Partial seizure (HCC) 11/08/2017  . Positive colorectal cancer screening using Cologuard test 10/22/2017  . Spastic hemiparesis (HCC)   . Cough   . 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 Pain   . Hyponatremia   . Benign essential HTN   . 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 Gait disturbance, post-stroke   . Aphasia as late effect of stroke   . SAH (subarachnoid hemorrhage) (HCC) 09/02/2017  . B12 deficiency 09/02/2017  . Well adult exam 06/16/2016  . Cigarette smoker 02/23/2016  . COPD GOLD II  12/24/2014  . CAD (coronary artery disease) 07/22/2011  . HTN (  hypertension) 07/22/2011  . Dyslipidemia 07/22/2011  . Smoker 07/22/2011   Dierdre Highman, PT, DPT 11/11/17    5:00 PM    [facility] [address] [place], [place], [address] Phone: [phone]   Fax:  [phone]  Name: Jaime Pierce MRN: [id number] Date of Birth: 1946/06/22

## 2017-11-11 NOTE — Patient Instructions (Signed)
  Please complete the assigned speech therapy homework prior to your next session and return it to the speech therapist at your next visit.  

## 2017-11-11 NOTE — Therapy (Signed)
Jaime [address] [place], [place], [address] Phone: [phone]   Fax:  [phone]  Occupational Therapy Treatment  Patient Details  Name: Jaime [place] MRN: [id number] Date of Birth: [birth year] Referring Provider (Historical): Dr. Donetta Potts   Encounter Date: 11/11/2017  OT End of Session - 11/11/17 1617    Visit Number  9    Number of Visits  24    Date for OT Re-Evaluation  12/18/17    Authorization Type  [employer] MCR - G code required    Authorization - Visit Number  9    Authorization - Number of Visits  10    OT Start Time  6256    OT Stop Time  1400    OT Time Calculation (min)  45 min    Activity Tolerance  Patient tolerated treatment well    Behavior During Therapy  [facility] for tasks assessed/performed       Past Medical History:  Diagnosis Date  . Coronary artery disease   . Dyspnea on exertion   . Hemorrhagic stroke ([place])   . Hyperlipidemia   . Hypertension   . Tobacco abuse     Past Surgical History:  Procedure Laterality Date  . CORONARY ARTERY BYPASS GRAFT      There were no vitals filed for this visit.  Subjective Assessment - 11/11/17 1318    Subjective   The doctor changed my meds    Pertinent History  ICH Lt parietal lobe 09/01/17. PMH: CAD, SAH, COPD, s/p CABG x 6    Limitations  Recent partial seizure 10/23/17 - no estim unless specific MD clearance, no driving for 6 months    Patient Stated Goals  be independent again    Currently in Pain?  No/denies                   OT Treatments/Exercises (OP) - 11/11/17 0001      ADLs   LB Dressing  Pt/family shown LH shoe horn and shoe buttons. Therapist demo use of shoe buttons and instructed on how/where to purchase both. Pt/family also educated in other options for tying shoes if interested. Pt I'ly demo donning/doffing Rt shoe and sock but currently dependent for tying shoe. Instructed to place foot over opposite  thigh to don socks.     Bathing  Discussed ways to increase independence with bathing including using LH sponge to wash back and feet (Pt already has LH sponge) and how to wash Lt arm either with mitt on Rt hemiplegic arm or using LH sponge. Emphasized that pt still needs supervision for bathing and min assist for shower transfer for safety.     ADL Comments  Encouraged pt to practice using Rt hemiplegic UE to help wash Lt arm and eat finger foods.       Neurological Re-education Exercises   Other Exercises 1  Supine: AA/ROM in chest press motion with PVC frame and min facilitation Rt elbow. Followed by bilateral sh. flexion using thick foam noodle with min assist RUE. Practiced moving from supine to sit and sit to supine with greatest ease and most efficient and safe technique    Other Exercises 2  Seated: AA/ROM in low range sh. flexion/extension rolling large physioball out               OT Short Term Goals - 11/11/17 1618      OT SHORT TERM GOAL #1   Title  Independent with initial  HEP     Time  4    Period  Weeks    Status  On-going      OT SHORT TERM GOAL #2   Title  Pt/wife to verbalize understanding with edema management (including compression glove) and safety considerations w/ lack of sensation Rt hand    Time  4    Period  Weeks    Status  Achieved      OT SHORT TERM GOAL #3   Title  Pt to demo 25 degrees shoulder flexion in prep for low level reaching RUE    Time  4    Period  Weeks    Status  Achieved      OT SHORT TERM GOAL #4   Title  Pt to consistently be mod I level for dressing and bathing with A/E and task modifications prn    Time  4    Period  Weeks    Status  On-going      OT SHORT TERM GOAL #5   Title  Pt to use Rt hand to feed self finger foods 50% of the time    Time  4    Period  Weeks    Status  On-going      OT SHORT TERM GOAL #6   Title  Pt to perform shower transfer with CGA only using DME prn    Time  4    Period  Weeks    Status   On-going        OT Long Term Goals - 10/19/17 1055      OT LONG TERM GOAL #1   Title  Pt to be independent with updated HEP     Time  8    Period  Weeks    Status  New    Target Date  12/18/17      OT LONG TERM GOAL #2   Title  Pt to demo shoulder flexion to 40 degrees or greater for consistent low level reaching    Time  8    Period  Weeks    Status  New      OT LONG TERM GOAL #3   Title  Pt to improve RUE functional use as evidenced by performing 10 blocks on [employer] test    Time  8    Period  Weeks    Status  New      OT LONG TERM GOAL #4   Title  Pt to use Rt hand to write name at 75% legibility or greater with A/E prn    Time  8    Period  Weeks    Status  New      OT LONG TERM GOAL #5   Title  Pt to use Rt hand for eating and grooming 75% of the time    Time  8    Period  Weeks    Status  New      Long Term Additional Goals   Additional Long Term Goals  Yes      OT LONG TERM GOAL #6   Title  Pt to return to simple cooking task with distant supervision and A/E prn    Time  8    Period  Weeks    Status  New            Plan - 11/11/17 1618    Clinical Impression Statement  Pt has met STG's #2 and #3. Pt progressing towards  remaining STG's.     Occupational Profile and client history currently impacting functional performance  PMH: CAD, HTN, SAH, s/p CABG x 6, COPD    Rehab Potential  Good    OT Frequency  3x / week    OT Duration  8 weeks    OT Treatment/Interventions  Self-care/ADL training;Moist Heat;DME and/or AE instruction;Splinting;Contrast Bath;Compression bandaging;Therapeutic activities;Ultrasound;Therapeutic exercise;Cognitive remediation/compensation;Neuromuscular education;Functional Mobility Training;Passive range of motion;Visual/perceptual remediation/compensation;Electrical Stimulation;Paraffin;Manual Therapy;Patient/family education    Plan  G-code and progress note, continue NMR RUE and trunk    Consulted and Agree with Plan of  Care  Patient;Family member/caregiver    Family Member Consulted  wife       Patient will benefit from skilled therapeutic intervention in order to improve the following deficits and impairments:  Decreased coordination, Decreased range of motion, Increased edema, Impaired sensation, Decreased knowledge of precautions, Impaired tone, Decreased activity tolerance, Decreased balance, Decreased knowledge of use of DME, Impaired UE functional use, Pain, Decreased cognition, Decreased mobility, Decreased strength, Impaired vision/preception  Visit Diagnosis: 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 Partial seizure ([place]) 11/08/2017  . Positive colorectal cancer screening using Cologuard test 10/22/2017  . Spastic hemiparesis ([place])   . Cough   . Nontraumatic hemorrhage of left cerebral hemisphere ([place])   . Pain   . Hyponatremia   . Benign essential HTN   . 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 Gait disturbance, post-stroke   . Aphasia as late effect of stroke   . SAH (subarachnoid hemorrhage) ([place]) 09/02/2017  . B12 deficiency 09/02/2017  . Well adult exam 06/16/2016  . Cigarette smoker 02/23/2016  . COPD GOLD II  12/24/2014  . CAD (coronary artery disease) 07/22/2011  . HTN (hypertension) 07/22/2011  . Dyslipidemia 07/22/2011  . Smoker 07/22/2011    Carey Bullocks, OTR/L 11/11/2017, 4:21 PM  [facility] [address] [place], [place], [address] Phone: [phone]   Fax:  [phone]  Name: Jaime [place] MRN: [id number] Date of Birth: 03/05/1946

## 2017-11-11 NOTE — Therapy (Signed)
[facility] [address] [place], [place], [address] Phone: [phone]   Fax:  [phone]  Speech Language Pathology Treatment  Patient Details  Name: Jaime FuelRichard A Tusing MRN: [id number] Date of Birth: [birth year] Referring Provider: Faith RogueSwartz, Zachary, MD   Encounter Date: 11/11/2017  End of Session - 11/11/17 1534    Visit Number  6    Number of Visits  25    Date for SLP Re-Evaluation  12/31/17    SLP Start Time  1403    SLP Stop Time   1445    SLP Time Calculation (min)  42 min    Activity Tolerance  Patient tolerated treatment well       Past Medical History:  Diagnosis Date  . Coronary artery disease   . Dyspnea on exertion   . Hemorrhagic stroke (HCC)   . Hyperlipidemia   . Hypertension   . Tobacco abuse     Past Surgical History:  Procedure Laterality Date  . CORONARY ARTERY BYPASS GRAFT      There were no vitals filed for this visit.  Subjective Assessment - 11/11/17 1412    Subjective  Pt arrived from PT.    Currently in Pain?  No/denies            ADULT SLP TREATMENT - 11/11/17 1412      General Information   Behavior/Cognition  Alert;Cooperative;Pleasant mood;Distractible      Treatment Provided   Treatment provided  Cognitive-Linquistic      Cognitive-Linquistic Treatment   Treatment focused on  Cognition    Skilled Treatment  Wife stated that working  memory homework went well (5 word scrambled sentences). In working memory tasks today pt told "odd man out" with 5 auditory words 100% success and 90% success recall of words. Analogies completed 100% success. When pt ordered 4 words (by size, location, age, volume, etc) pt forgot at least one item 35% of the time - when he needed to think about how to order the words. Pt asked if he could "graduate" today - SLP told pt he was making progress but he wasn't ready for d/c yet.      Assessment / Recommendations / Plan   Plan  Continue  with current plan of care      Progression Toward Goals   Progression toward goals  Progressing toward goals         SLP Short Term Goals - 11/11/17 1535      SLP SHORT TERM GOAL #1   Title  pt will demo emergent awareness 90% of the time in mod complex cognitive linguistic tasks    Time  2    Period  Weeks    Status  On-going      SLP SHORT TERM GOAL #2   Title  pt will recall memory strategies (WARM) with modified independence over three sessions    Baseline  11-10-17    Time  2    Period  Weeks    Status  On-going      SLP SHORT TERM GOAL #3   Title  pt will tell SLP three non-physical deficits over two sessions    Time  2    Period  Weeks    Status  On-going      SLP SHORT TERM GOAL #4   Title  pt will complete min-mod complex reasoning/problem solving tasks involving money/time/etc with 95% success over three sessions    Time  2  Period  Weeks    Status  On-going       SLP Long Term Goals - 11/11/17 1536      SLP LONG TERM GOAL #1   Title  pt will use 1 memory strategy in 5 therapy sessions    Time  6    Period  Weeks    Status  On-going      SLP LONG TERM GOAL #2   Title  pt will demo [facility] executive function skills when presented with problems in everyday tasks over three sessions, with SBA    Time  6    Period  Weeks    Status  On-going      SLP LONG TERM GOAL #3   Title  pt will demo simple divided attention with 90% success on both tasks over three sessions    Time  6    Period  Weeks    Status  On-going      SLP LONG TERM GOAL #4   Title  pt will use anticipatory awareness by double checking written cognitive linguistic tasks 100% of the time in 4 sessions    Time  6    Status  On-going       Plan - 11/11/17 1535    Clinical Impression Statement  Pt cont to exhibit cognitive deficits in attention, working memory with mod complex auditory info, and emergent and anticipatory awareness. SLP believes pt's emergent awareness has improved from  previous weeks. Pt will benefit from skilled ST to address these deficits in order to return pt to PLOF.    Speech Therapy Frequency  3x / week    Treatment/Interventions  SLP instruction and feedback;Compensatory strategies;Patient/family education;Internal/external aids;Cognitive reorganization;Cueing hierarchy;Environmental controls;Language facilitation;Functional tasks    Potential to Achieve Goals  Good    Consulted and Agree with Plan of Care  Patient       Patient will benefit from skilled therapeutic intervention in order to improve the following deficits and impairments:   Cognitive communication deficit    Problem List Patient Active Problem List   Diagnosis Date Noted  . Partial seizure (HCC) 11/08/2017  . Positive colorectal cancer screening using Cologuard test 10/22/2017  . Spastic hemiparesis (HCC)   . Cough   . 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 Pain   . Hyponatremia   . Benign essential HTN   . 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 Gait disturbance, post-stroke   . Aphasia as late effect of stroke   . SAH (subarachnoid hemorrhage) (HCC) 09/02/2017  . B12 deficiency 09/02/2017  . Well adult exam 06/16/2016  . Cigarette smoker 02/23/2016  . COPD GOLD II  12/24/2014  . CAD (coronary artery disease) 07/22/2011  . HTN (hypertension) 07/22/2011  . Dyslipidemia 07/22/2011  . Smoker 07/22/2011    [facility] Inc[facility]INKE,Yahir Tavano ,MS, CCC-SLP  11/11/2017, 3:36 PM  [facility] [address] [place], [place], [address] Phone: [phone]   Fax:  [phone]   Name: Jaime FuelRichard A Bolding MRN: [id number] Date of Birth: 31-Jan-1946

## 2017-11-12 ENCOUNTER — Ambulatory Visit: Payer: Medicare HMO

## 2017-11-12 ENCOUNTER — Ambulatory Visit: Payer: Medicare HMO | Admitting: Physical Therapy

## 2017-11-12 ENCOUNTER — Encounter: Payer: Self-pay | Admitting: Physical Therapy

## 2017-11-12 ENCOUNTER — Other Ambulatory Visit: Payer: Self-pay

## 2017-11-12 DIAGNOSIS — I69251 Hemiplegia and hemiparesis following other nontraumatic intracranial hemorrhage affecting right dominant side: Secondary | ICD-10-CM

## 2017-11-12 DIAGNOSIS — R2689 Other abnormalities of gait and mobility: Secondary | ICD-10-CM

## 2017-11-12 DIAGNOSIS — R41841 Cognitive communication deficit: Secondary | ICD-10-CM

## 2017-11-12 DIAGNOSIS — I69351 Hemiplegia and hemiparesis following cerebral infarction affecting right dominant side: Secondary | ICD-10-CM | POA: Diagnosis not present

## 2017-11-12 DIAGNOSIS — M6281 Muscle weakness (generalized): Secondary | ICD-10-CM

## 2017-11-12 DIAGNOSIS — R208 Other disturbances of skin sensation: Secondary | ICD-10-CM

## 2017-11-12 DIAGNOSIS — M25561 Pain in right knee: Secondary | ICD-10-CM

## 2017-11-12 NOTE — Therapy (Signed)
[facility] [address] [place], [place], [address] Phone: [phone]   Fax:  [phone]  Physical Therapy Treatment  Patient Details  Name: Jaime Pierce MRN: [id number] Date of Birth: July 22, 1946 Referring Provider: Ranelle OysterZachary T. Swartz, MD   Encounter Date: 11/12/2017  PT End of Session - 11/12/17 1252    Visit Number  10    Number of Visits  25    Date for PT Re-Evaluation  12/18/17    Authorization Type  [employer] - G code and PN every 10th visit    PT Start Time  1105    PT Stop Time  1150    PT Time Calculation (min)  45 min    Activity Tolerance  Patient tolerated treatment well    Behavior During Therapy  [facility] for tasks assessed/performed       Past Medical History:  Diagnosis Date  . Coronary artery disease   . Dyspnea on exertion   . Hemorrhagic stroke (HCC)   . Hyperlipidemia   . Hypertension   . Tobacco abuse     Past Surgical History:  Procedure Laterality Date  . CORONARY ARTERY BYPASS GRAFT      There were no vitals filed for this visit.  Subjective Assessment - 11/12/17 1104    Subjective  Was not too fatigued yesterday evening; took a shower and stayed up late to watch basketball game.    Patient is accompained by:  Family member wife: [name]    Pertinent History  CAD with CABG, DOE, hyperlipidemia, HTN and tobacco abuse    Limitations  Standing;Walking    How long can you walk comfortably?  70165'    Patient Stated Goals  To be able to do the things he did before: mow with his tractor, get into and out of the boat for fishing!    Currently in Pain?  No/denies         [facility] - 11/12/17 1116      Standardized Balance Assessment   Standardized Balance Assessment  10 meter walk test    10 Meter Walk  .58 seconds or .56 ft/sec                  [facility] Adult PT Treatment/Exercise - 11/12/17 1140      Transfers   Transfers  Stand to Sit;Sit to  Stand;Stand Pivot Transfers    Sit to Stand  5: Supervision    Sit to Stand Details (indicate cue type and reason)  can perform with supervision and without use of UE    Stand to Sit  5: Supervision    Stand to Sit Details  continues to require cues to release RW and to reach back for seat with RUE prior to sitting    Stand Pivot Transfers  5: Supervision    Stand Pivot Transfer Details (indicate cue type and reason)  Supervision with RW; without AD pt requires min HHA.  Pt requires less cues for full pivot prior to sitting      Ambulation/Gait   Ambulation/Gait  Yes    Ambulation/Gait Assistance  5: Supervision    Ambulation/Gait Assistance Details  Improved step length and foot clearance RLE after performing stairs    Ambulation Distance (Feet)  200 Feet    Assistive device  Rolling walker    Gait Pattern  Step-through pattern;Decreased stride length;Decreased stance time - right    Ambulation Surface  Level;Indoor    Gait velocity  .  58 seconds or .56 ft/sec    Stairs  Yes    Stairs Assistance  4: Min assist;3: Mod assist    Stairs Assistance Details (indicate cue type and reason)  stair negotiation training initially with bilat UE support on rails and step to sequencing progressing to bilat UE support and alternating sequence >> unilat UE support, RUE to ascend, LUE to descend, with step to sequencing.  Pt has greatest difficulty with ascending with RUE support only - pt more hesitant to weight shift and fully WB through RUE and LE; when descending pt has increased difficulty with forwards weight shift during alternating sequence.    Stair Management Technique  One rail Right;One rail Left;Two rails;Alternating pattern;Step to pattern;Forwards    Number of Stairs  8    Height of Stairs  6      Neuro Re-ed    Neuro Re-ed Details   Standing facing mat with UE in partial WB on bench on top of mat; performed rocking forwards and back for increased WB through UE; performed alternating hip  flexion marches and then hip extension x 6 reps each to continue to facilitate weight shift and WB through R side for increased proprioceptive input and activation              PT Short Term Goals - 10/28/17 1257      PT SHORT TERM GOAL #1   Title  Pt will participate in further assessment of balance with TUG and five time sit to stand with RW    Time  4    Period  Weeks    Status  Achieved      PT SHORT TERM GOAL #2   Title  Pt will perform sit <> stand and stand pivot transfers with LRAD and supervision with <10% cues for safety/sequencing    Baseline  min A with 50% cues    Time  4    Period  Weeks    Status  New    Target Date  11/18/17      PT SHORT TERM GOAL #3   Title  Pt will ambulate x 250' on level, indoor surfaces (to/from waiting area) and will negotiate short ramp to simulate home entry/exit with LRAD and min A     Baseline  165' with RW min A, w/c for majority of mobility and to enter/exit home on ramp    Time  4    Period  Weeks    Status  New    Target Date  11/18/17      PT SHORT TERM GOAL #4   Title  Pt will improve gait velocity to > or = 1.0 ft/sec    Baseline  .36 ft/sec with RW    Time  4    Period  Weeks    Status  New    Target Date  11/18/17      PT SHORT TERM GOAL #5   Title  Pt will initiate gait training with cane or lesser AD over level, indoor surfaces with therapy    Time  4    Period  Weeks    Status  New    Target Date  11/18/17        PT Long Term Goals - 10/28/17 1256      PT LONG TERM GOAL #1   Title  Pt and wife will demonstrate independence with HEP    Time  8    Period  Weeks  Status  New    Target Date  12/18/16      PT LONG TERM GOAL #2   Title  Pt will decrease TUG time by  seconds with LRAD to </=47 sec. to decr. falls risk.    Baseline  62.62 sec. with RW and R hand orthosis on RW    Time  8    Period  Weeks    Status  New    Target Date  12/18/16      PT LONG TERM GOAL #3   Title  Pt will improve LE  strength as indicated by decrease in five time sit to stand to <12 sec.    Baseline  17.27 seconds    Time  8    Period  Weeks    Status  New    Target Date  12/18/16      PT LONG TERM GOAL #4   Title  Pt will perform bed mobility and all transfers with LRAD MOD I    Time  8    Period  Weeks    Status  New    Target Date  12/18/16      PT LONG TERM GOAL #5   Title  Pt will ambulate >300 over indoor and paved outdoor surfaces and negotiate ramp with LRAD and supervision for more independent household and community mobility    Time  8    Period  Weeks    Status  New    Target Date  12/18/16      PT LONG TERM GOAL #6   Title  Pt will improve gait velocity to > or = 1.85ft/sec with LRAD    Time  8    Period  Weeks    Status  New    Target Date  12/18/16      PT LONG TERM GOAL #7   Title  Pt will improve overall function on FOTO by 25 points    Baseline  28%    Time  8    Period  Weeks    Status  New    Target Date  12/18/16            Plan - 11/12/17 1252    Clinical Impression Statement  Treatment session focused initially on stair negotiation training due to pt's goal to eventually enter/exit house with stairs when ramp is removed.  Pt demonstrated good activation and use of R UE and LE but continues to require cues and hands on facilitation for attention R hand placement, sequencing, weight shifting and full weight acceptance and activation of R side.  Transitioned to NMR with UE in partial WB on bench for continued focus on weight shifting to R, postural control, proprioceptive input and activation of R side.  Pt is making steady progress towards goals as indicated by decreased assistance required for transfers and gait and gait speed increasing to .56 ft/sec (required 32 seconds less to ambulate 10 meters with RW).  Will continue to progress towards LTG.    Rehab Potential  Good    PT Frequency  3x / week    PT Duration  8 weeks    PT Treatment/Interventions  ADLs/Self  Care Home Management;Electrical Stimulation;DME Instruction;Gait training;Stair training;Functional mobility training;Therapeutic activities;Therapeutic exercise;Balance training;Neuromuscular re-education;Cognitive remediation;Patient/family education;Orthotic Fit/Training;Passive range of motion;Energy conservation;Taping;Manual techniques    PT Next Visit Plan  NO ESTIM/BIONESS DUE TO RECENT SEIZURES PER DR. AVWUJW;  Check STG!!  Continue ramp training with RW, cane in //  bars, stair training, Could try NMR in supported quadruped    PT Home Exercise Plan  pt goals include mowing with his tractor and fishing on his boat - incorporate into balance activities    Consulted and Agree with Plan of Care  Patient;Family member/caregiver    Family Member Consulted  wife       Patient will benefit from skilled therapeutic intervention in order to improve the following deficits and impairments:  Abnormal gait, Cardiopulmonary status limiting activity, Decreased balance, Decreased cognition, Decreased endurance, Decreased mobility, Decreased strength, Difficulty walking, Increased muscle spasms, Impaired sensation, Impaired tone, Impaired UE functional use, Postural dysfunction, Pain, Decreased range of motion, Increased edema  Visit Diagnosis: Other disturbances of skin sensation  Muscle weakness (generalized)  Other abnormalities of gait and mobility  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)  Right knee pain, unspecified chronicity   G-Codes - 11/12/17 1109    Functional Assessment Tool Used (Outpatient Only)  FOTO: 28%/72% limited; supervision transfers and gait with RW; gait velocity .56 ft/sec    Functional Limitation  Mobility: Walking and moving around    Mobility: Walking and Moving Around Current Status [phone]([id number])  At least 40 percent but less than 60 percent impaired, limited or restricted    Mobility: Walking and  Moving Around Goal Status [phone]([id number])  At least 20 percent but less than 40 percent impaired, limited or restricted      Physical Therapy Progress Note  Dates of Reporting Period: 10/19/17 to 11/12/17  Objective Reports of Subjective Statement: See impression statement above  Objective Measurements: Gait velocity, transfers, gait  Goal Update: Progressing towards STG and LTG: STG to be assessed at next visit  Plan: Continue POC  Reason Skilled Services are Required: to continue to address impairments in hemiplegia, proprioception, motor planning, postural control, balance, gait, endurance to maximize functional mobility independence.   Problem List Patient Active Problem List   Diagnosis Date Noted  . Partial seizure (HCC) 11/08/2017  . Positive colorectal cancer screening using Cologuard test 10/22/2017  . Spastic hemiparesis (HCC)   . Cough   . 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 Pain   . Hyponatremia   . Benign essential HTN   . 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 Gait disturbance, post-stroke   . Aphasia as late effect of stroke   . SAH (subarachnoid hemorrhage) (HCC) 09/02/2017  . B12 deficiency 09/02/2017  . Well adult exam 06/16/2016  . Cigarette smoker 02/23/2016  . COPD GOLD II  12/24/2014  . CAD (coronary artery disease) 07/22/2011  . HTN (hypertension) 07/22/2011  . Dyslipidemia 07/22/2011  . Smoker 07/22/2011    Dierdre HighmanAudra F Potter, PT, DPT 11/12/17    1:01 PM    [facility] [address] [place], [place], [address] Phone: [phone]   Fax:  [phone]  Name: Jaime Pierce MRN: [id number] Date of Birth: 03-Aug-1946

## 2017-11-14 DIAGNOSIS — I69151 Hemiplegia and hemiparesis following nontraumatic intracerebral hemorrhage affecting right dominant side: Secondary | ICD-10-CM | POA: Diagnosis not present

## 2017-11-14 DIAGNOSIS — J449 Chronic obstructive pulmonary disease, unspecified: Secondary | ICD-10-CM | POA: Diagnosis not present

## 2017-11-15 ENCOUNTER — Ambulatory Visit: Payer: Medicare HMO

## 2017-11-15 ENCOUNTER — Telehealth: Payer: Self-pay | Admitting: Internal Medicine

## 2017-11-15 MED ORDER — METOPROLOL TARTRATE 25 MG PO TABS: 12.5000 mg | ORAL_TABLET | Freq: Two times a day (BID) | ORAL | 1 refills | Status: AC

## 2017-11-15 NOTE — Telephone Encounter (Signed)
Copied from CRM [id number]. Topic: General - Other >> Nov 15, 2017  8:39 AM Cecelia ByarsGreen, Lamorris Knoblock L, RMA wrote: Reason for CRM: Medication [facility]ill [facility] 25 [facility], pt is completely out of this medication

## 2017-11-15 NOTE — Telephone Encounter (Signed)
Reviewed chart pt is up-to-date sent refills to [facility].../lmb  

## 2017-11-15 NOTE — Patient Instructions (Signed)
  Please complete the assigned speech therapy homework prior to your next session and return it to the speech therapist at your next visit.  

## 2017-11-15 NOTE — Therapy (Signed)
[facility] [address] [place], [place], [address] Phone: [phone]   Fax:  [phone]  Speech Language Pathology Treatment  Patient Details  Name: Jaime FuelRichard A Pierce MRN: [id number] Date of Birth: July 18, 1946 Referring Provider: Faith RogueSwartz, Zachary, MD   Encounter Date: 11/12/2017  End of Session - 11/15/17 0007    Visit Number  7    Number of Visits  25    Date for SLP Re-Evaluation  12/31/17    SLP Start Time  1018    SLP Stop Time   1100    SLP Time Calculation (min)  42 min    Activity Tolerance  Patient tolerated treatment well       Past Medical History:  Diagnosis Date  . Coronary artery disease   . Dyspnea on exertion   . Hemorrhagic stroke (HCC)   . Hyperlipidemia   . Hypertension   . Tobacco abuse     Past Surgical History:  Procedure Laterality Date  . CORONARY ARTERY BYPASS GRAFT      There were no vitals filed for this visit.         ADULT SLP TREATMENT - 11/15/17 0001      General Information   Behavior/Cognition  Alert;Cooperative;Pleasant mood;Distractible      Treatment Provided   Treatment provided  Cognitive-Linquistic      Cognitive-Linquistic Treatment   Treatment focused on  Cognition    Skilled Treatment  Pt completed word problems with simple math provided verbally by SLP with 75% accuracy, and 90% with extra time. SLP provided homework for pt.       Assessment / Recommendations / Plan   Plan  Continue with current plan of care      Progression Toward Goals   Progression toward goals  Progressing toward goals         SLP Short Term Goals - 11/15/17 0009      SLP SHORT TERM GOAL #1   Title  pt will demo emergent awareness 90% of the time in mod complex cognitive linguistic tasks    Time  2    Period  Weeks    Status  On-going      SLP SHORT TERM GOAL #2   Title  pt will recall memory strategies (WARM) with modified independence over three sessions    Baseline  11-10-17    Time  2    Period  Weeks    Status  On-going      SLP SHORT TERM GOAL #3   Title  pt will tell SLP three non-physical deficits over two sessions    Time  2    Period  Weeks    Status  On-going      SLP SHORT TERM GOAL #4   Title  pt will complete min-mod complex reasoning/problem solving tasks involving money/time/etc with 95% success over three sessions    Time  2    Period  Weeks    Status  On-going       SLP Long Term Goals - 11/15/17 0009      SLP LONG TERM GOAL #1   Title  pt will use 1 memory strategy in 5 therapy sessions    Time  6    Period  Weeks    Status  On-going      SLP LONG TERM GOAL #2   Title  pt will demo [facility] executive function skills when presented with problems in everyday tasks over three sessions,  with SBA    Time  6    Period  Weeks    Status  On-going      SLP LONG TERM GOAL #3   Title  pt will demo simple divided attention with 90% success on both tasks over three sessions    Time  6    Period  Weeks    Status  On-going      SLP LONG TERM GOAL #4   Title  pt will use anticipatory awareness by double checking written cognitive linguistic tasks 100% of the time in 4 sessions    Time  6    Status  On-going       Plan - 11/15/17 0008    Clinical Impression Statement  Pt cont to exhibit cognitive deficits in attention, working memory with simple to mod complex auditory info, and emergent and anticipatory awareness. SLP believes pt's emergent awareness has improved from previous weeks. Pt will benefit from skilled ST to address these deficits in order to return pt to PLOF.    Speech Therapy Frequency  3x / week    Treatment/Interventions  SLP instruction and feedback;Compensatory strategies;Patient/family education;Internal/external aids;Cognitive reorganization;Cueing hierarchy;Environmental controls;Language facilitation;Functional tasks    Potential to Achieve Goals  Good    Consulted and Agree with Plan of Care  Patient        Patient will benefit from skilled therapeutic intervention in order to improve the following deficits and impairments:   Cognitive communication deficit    Problem List Patient Active Problem List   Diagnosis Date Noted  . Partial seizure (HCC) 11/08/2017  . Positive colorectal cancer screening using Cologuard test 10/22/2017  . Spastic hemiparesis (HCC)   . Cough   . 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 Pain   . Hyponatremia   . Benign essential HTN   . 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 Gait disturbance, post-stroke   . Aphasia as late effect of stroke   . SAH (subarachnoid hemorrhage) (HCC) 09/02/2017  . B12 deficiency 09/02/2017  . Well adult exam 06/16/2016  . Cigarette smoker 02/23/2016  . COPD GOLD II  12/24/2014  . CAD (coronary artery disease) 07/22/2011  . HTN (hypertension) 07/22/2011  . Dyslipidemia 07/22/2011  . Smoker 07/22/2011    [facility] System[facility]INKE,Jaime Pierce ,MS, CCC-SLP  11/15/2017, 12:09 AM  [facility] [address] [place], [place], [address] Phone: [phone]   Fax:  [phone]   Name: Jaime FuelRichard A Pierce MRN: [id number] Date of Birth: [birth year]

## 2017-11-17 ENCOUNTER — Encounter: Payer: Medicare HMO | Admitting: Occupational Therapy

## 2017-11-17 ENCOUNTER — Ambulatory Visit: Payer: Medicare HMO | Admitting: Occupational Therapy

## 2017-11-17 ENCOUNTER — Encounter: Payer: Self-pay | Admitting: Physical Therapy

## 2017-11-17 ENCOUNTER — Ambulatory Visit: Payer: Medicare HMO | Admitting: Physical Therapy

## 2017-11-17 DIAGNOSIS — M6281 Muscle weakness (generalized): Secondary | ICD-10-CM

## 2017-11-17 DIAGNOSIS — R2689 Other abnormalities of gait and mobility: Secondary | ICD-10-CM

## 2017-11-17 DIAGNOSIS — I69251 Hemiplegia and hemiparesis following other nontraumatic intracranial hemorrhage affecting right dominant side: Secondary | ICD-10-CM

## 2017-11-17 DIAGNOSIS — I69351 Hemiplegia and hemiparesis following cerebral infarction affecting right dominant side: Secondary | ICD-10-CM | POA: Diagnosis not present

## 2017-11-17 DIAGNOSIS — R2681 Unsteadiness on feet: Secondary | ICD-10-CM

## 2017-11-17 DIAGNOSIS — R208 Other disturbances of skin sensation: Secondary | ICD-10-CM

## 2017-11-17 NOTE — Therapy (Signed)
[facility] [address] [place], [place], [address] Phone: [phone]   Fax:  [phone]  Occupational Therapy Treatment  Patient Details  Name: Jaime FuelRichard A Pierce MRN: [id number] Date of Birth: 1946/06/26 Referring Provider (Historical): Dr. Dow AdolphZachery Swartz   Encounter Date: 11/17/2017  OT End of Session - 11/17/17 1442    Visit Number  10    Number of Visits  24    Date for OT Re-Evaluation  12/18/17    Authorization Type  [employer] MCR - G code required    Authorization - Visit Number  10    Authorization - Number of Visits  20    OT Start Time  1405    OT Stop Time  1447    OT Time Calculation (min)  42 min    Activity Tolerance  Patient tolerated treatment well    Behavior During Therapy  [facility] for tasks assessed/performed       Past Medical History:  Diagnosis Date  . Coronary artery disease   . Dyspnea on exertion   . Hemorrhagic stroke (HCC)   . Hyperlipidemia   . Hypertension   . Tobacco abuse     Past Surgical History:  Procedure Laterality Date  . CORONARY ARTERY BYPASS GRAFT      There were no vitals filed for this visit.  Subjective Assessment - 11/17/17 1617    Pertinent History  ICH Lt parietal lobe 09/01/17. PMH: CAD, SAH, COPD, s/p CABG x 6    Limitations  Recent partial seizure 10/23/17 - no estim unless specific MD clearance, no driving for 6 months    Patient Stated Goals  be independent again    Currently in Pain?  Yes    Pain Score  3     Pain Location  Arm    Pain Orientation  Right    Pain Descriptors / Indicators  Aching    Pain Type  Acute pain    Pain Onset  More than a month ago    Pain Frequency  Intermittent    Aggravating Factors   malpositioning    Pain Relieving Factors  reposistioning    Multiple Pain Sites  No          Treatment:Pt /wife report a recent fall. Pt appears unharmed, however he was instructed to contact MD if he notes increased pain or swelling  anywhere. Supine AA/ROM shoulder flexion with PVC pipe frame, min/ mod facilitation. Seated shoulder flexion while weightbearing through tilted stool, min- mod facilitation. Weightbearing edge of mat through RUE, min facilitation/ v.c Picking up medium ball from floor with bilateral UE's, then picking up cones from floor with RUE, min-mod v.c/ facilitation.                   OT Short Term Goals - 11/11/17 1618      OT SHORT TERM GOAL #1   Title  Independent with initial HEP     Time  4    Period  Weeks    Status  On-going      OT SHORT TERM GOAL #2   Title  Pt/wife to verbalize understanding with edema management (including compression glove) and safety considerations w/ lack of sensation Rt hand    Time  4    Period  Weeks    Status  Achieved      OT SHORT TERM GOAL #3   Title  Pt to demo 25 degrees shoulder flexion in prep for low  level reaching RUE    Time  4    Period  Weeks    Status  Achieved      OT SHORT TERM GOAL #4   Title  Pt to consistently be mod I level for dressing and bathing with A/E and task modifications prn    Time  4    Period  Weeks    Status  On-going      OT SHORT TERM GOAL #5   Title  Pt to use Rt hand to feed self finger foods 50% of the time    Time  4    Period  Weeks    Status  On-going      OT SHORT TERM GOAL #6   Title  Pt to perform shower transfer with CGA only using DME prn    Time  4    Period  Weeks    Status  On-going        OT Long Term Goals - 10/19/17 1055      OT LONG TERM GOAL #1   Title  Pt to be independent with updated HEP     Time  8    Period  Weeks    Status  New    Target Date  12/18/17      OT LONG TERM GOAL #2   Title  Pt to demo shoulder flexion to 40 degrees or greater for consistent low level reaching    Time  8    Period  Weeks    Status  New      OT LONG TERM GOAL #3   Title  Pt to improve RUE functional use as evidenced by performing 10 blocks on Box & Blocks test    Time  8     Period  Weeks    Status  New      OT LONG TERM GOAL #4   Title  Pt to use Rt hand to write name at 75% legibility or greater with A/E prn    Time  8    Period  Weeks    Status  New      OT LONG TERM GOAL #5   Title  Pt to use Rt hand for eating and grooming 75% of the time    Time  8    Period  Weeks    Status  New      Long Term Additional Goals   Additional Long Term Goals  Yes      OT LONG TERM GOAL #6   Title  Pt to return to simple cooking task with distant supervision and A/E prn    Time  8    Period  Weeks    Status  New            Plan - 11/17/17 1628    Clinical Impression Statement  Pt is progressing towards goals for RUE functional use, however he remains limited by right inattention and cognitive deficits.    Occupational Profile and client history currently impacting functional performance  PMH: CAD, HTN, SAH, s/p CABG x 6, COPD    Occupational performance deficits (Please refer to evaluation for details):  ADL's;IADL's;Leisure;Social Participation    Rehab Potential  Good    OT Frequency  3x / week    OT Duration  8 weeks    OT Treatment/Interventions  Self-care/ADL training;Moist Heat;DME and/or AE instruction;Splinting;Contrast Bath;Compression bandaging;Therapeutic activities;Ultrasound;Therapeutic exercise;Cognitive remediation/compensation;Neuromuscular education;Functional Mobility Training;Passive range of motion;Visual/perceptual remediation/compensation;Electrical Stimulation;Paraffin;Manual Therapy;Patient/family  education    Plan  NMR RUE/ trunk    Consulted and Agree with Plan of Care  Patient;Family member/caregiver    Family Member Consulted  wife       Patient will benefit from skilled therapeutic intervention in order to improve the following deficits and impairments:  Decreased coordination, Decreased range of motion, Increased edema, Impaired sensation, Decreased knowledge of precautions, Impaired tone, Decreased activity tolerance,  Decreased balance, Decreased knowledge of use of DME, Impaired UE functional use, Pain, Decreased cognition, Decreased mobility, Decreased strength, Impaired vision/preception  Visit Diagnosis: Other disturbances of skin sensation  Muscle weakness (generalized)  Other abnormalities of gait and mobility  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)  G-Codes - 11/17/17 1611    Functional Assessment Tool Used (Outpatient only)  RUE: pt uses minimally/ and requires v.c to attend to RUE    Carrying, Moving and Handling Objects Current Status [phone]([id number])  At least 80 percent but less than 100 percent impaired, limited or restricted    Carrying, Moving and Handling Objects Goal Status ([id number])  At least 80 percent but less than 100 percent impaired, limited or restricted     Occupational Therapy Progress Note  Dates of Reporting Period: 10/19/17 to 11/17/18  Objective Reports of Subjective Statement: Pt uses RUE less than 10% of the time as a gross/ active assist  Objective Measurements: see above  Goal Update: Pt is progressing slowly towards goals.  Plan: Continue to work towards unmet goals.  Reason Skilled Services are Required: Pt can benefit from skilled occupational therapy to address the following deficits: RUE weakness, decreased coordination, decreased balance, decreased functional mobility and right inattention in order to maximize pt/ safety and independence with ADLS/IADLS.  Problem List Patient Active Problem List   Diagnosis Date Noted  . Partial seizure (HCC) 11/08/2017  . Positive colorectal cancer screening using Cologuard test 10/22/2017  . Spastic hemiparesis (HCC)   . Cough   . 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 Pain   . Hyponatremia   . Benign essential HTN   . 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 Gait  disturbance, post-stroke   . Aphasia as late effect of stroke   . SAH (subarachnoid hemorrhage) (HCC) 09/02/2017  . B12 deficiency 09/02/2017  . Well adult exam 06/16/2016  . Cigarette smoker 02/23/2016  . COPD GOLD II  12/24/2014  . CAD (coronary artery disease) 07/22/2011  . HTN (hypertension) 07/22/2011  . Dyslipidemia 07/22/2011  . Smoker 07/22/2011    Terell Kincy 11/17/2017, 4:29 PM Keene BreathKathryn Nicoya Friel, OTR/L [phone] Phone: [phone] 4:30 PM 11/17/17 [facility] [address] [place], [place], [address] Phone: [phone]   Fax:  [phone]  Name: Jaime FuelRichard A Pierce MRN: [id number] Date of Birth: 11-17-1946

## 2017-11-18 ENCOUNTER — Encounter: Payer: Self-pay | Admitting: Speech Pathology

## 2017-11-18 ENCOUNTER — Ambulatory Visit: Payer: Medicare HMO

## 2017-11-18 ENCOUNTER — Encounter: Payer: Self-pay | Admitting: Occupational Therapy

## 2017-11-18 ENCOUNTER — Ambulatory Visit: Payer: Medicare HMO | Admitting: Occupational Therapy

## 2017-11-18 ENCOUNTER — Other Ambulatory Visit: Payer: Self-pay

## 2017-11-18 ENCOUNTER — Ambulatory Visit: Payer: Medicare HMO | Admitting: Speech Pathology

## 2017-11-18 ENCOUNTER — Encounter: Payer: Medicare HMO | Admitting: Occupational Therapy

## 2017-11-18 DIAGNOSIS — M25511 Pain in right shoulder: Secondary | ICD-10-CM

## 2017-11-18 DIAGNOSIS — R2681 Unsteadiness on feet: Secondary | ICD-10-CM

## 2017-11-18 DIAGNOSIS — R208 Other disturbances of skin sensation: Secondary | ICD-10-CM

## 2017-11-18 DIAGNOSIS — R41841 Cognitive communication deficit: Secondary | ICD-10-CM

## 2017-11-18 DIAGNOSIS — R2689 Other abnormalities of gait and mobility: Secondary | ICD-10-CM

## 2017-11-18 DIAGNOSIS — I69218 Other symptoms and signs involving cognitive functions following other nontraumatic intracranial hemorrhage: Secondary | ICD-10-CM

## 2017-11-18 DIAGNOSIS — I69251 Hemiplegia and hemiparesis following other nontraumatic intracranial hemorrhage affecting right dominant side: Secondary | ICD-10-CM

## 2017-11-18 DIAGNOSIS — R278 Other lack of coordination: Secondary | ICD-10-CM

## 2017-11-18 DIAGNOSIS — M6281 Muscle weakness (generalized): Secondary | ICD-10-CM

## 2017-11-18 DIAGNOSIS — I69351 Hemiplegia and hemiparesis following cerebral infarction affecting right dominant side: Secondary | ICD-10-CM | POA: Diagnosis not present

## 2017-11-18 NOTE — Therapy (Signed)
[facility] [address] [place], [place], [address] Phone: [phone]   Fax:  [phone]  Physical Therapy Treatment  Patient Details  Name: Jaime Pierce MRN: [id number] Date of Birth: 10-Jun-1946 Referring Provider: Meredith Staggers, MD   Encounter Date: 11/18/2017  PT End of Session - 11/18/17 1200    Visit Number  12    Number of Visits  25    Date for PT Re-Evaluation  12/18/17    Authorization Type  [employer] Medicare - G code and PN every 10th visit    PT Start Time  1102    PT Stop Time  1144    PT Time Calculation (min)  42 min    Equipment Utilized During Treatment  Gait belt    Activity Tolerance  Patient tolerated treatment well;Patient limited by fatigue    Behavior During Therapy  [facility] for tasks assessed/performed       Past Medical History:  Diagnosis Date  . Coronary artery disease   . Dyspnea on exertion   . Hemorrhagic stroke ([place])   . Hyperlipidemia   . Hypertension   . Tobacco abuse     Past Surgical History:  Procedure Laterality Date  . CORONARY ARTERY BYPASS GRAFT      There were no vitals filed for this visit.  Subjective Assessment - 11/18/17 1104    Subjective  Pt reported he fell Saturday morning and reached outside BOS with RW to turn on light. Pt reports he feels better today. Pt denied seizures since last visit.     Patient is accompained by:  Family member wife: Butch Penny    Pertinent History  CAD with CABG, DOE, hyperlipidemia, HTN and tobacco abuse    Patient Stated Goals  To be able to do the things he did before: mow with his tractor, get into and out of the boat for fishing!    Currently in Pain?  No/denies          Neuro re-ed: -Performed in tall kneeling with BUE support: pt performed 3x10 reps of squats with assist and cues to improve lateral weight shifting to the R side. Pt also performed sidestepping with BUE support 6x3'/direction to improve weight  shifting. Pt required cues to engage abdominal muscles, glute muscles, and upright posture during activities. Pt denied pain but did require 3 seated rest breaks 2/2 fatigue, as this was pt's third therapy session of the morning.  -Standing with BUE support on RW: pt performed B marches to improve weight shifting, with PT providing min A to keep R knee from buckling during SLS. x10 reps/LE.                  PT Short Term Goals - 11/17/17 2035      PT SHORT TERM GOAL #1   Title  Pt will participate in further assessment of balance with TUG and five time sit to stand with RW    Time  4    Period  Weeks    Status  Achieved      PT SHORT TERM GOAL #2   Title  Pt will perform sit <> stand and stand pivot transfers with LRAD and supervision with <10% cues for safety/sequencing    Baseline  MET 11/17/2017    Time  4    Period  Weeks    Status  Achieved      PT SHORT TERM GOAL #3   Title  Pt will ambulate  x 250' on level, indoor surfaces (to/from waiting area) and will negotiate short ramp to simulate home entry/exit with LRAD and min A     Baseline  MET 11/17/2017    Time  4    Period  Weeks    Status  Achieved      PT SHORT TERM GOAL #4   Title  Pt will improve gait velocity to > or = 1.0 ft/sec    Baseline  NOT MET 11/17/2017  Gait Velocity 0.61 ft/sec    Time  4    Period  Weeks    Status  Not Met      PT SHORT TERM GOAL #5   Title  Pt will initiate gait training with cane or lesser AD over level, indoor surfaces with therapy    Baseline  MET 11/17/2017    Time  4    Period  Weeks    Status  Achieved        PT Long Term Goals - 10/28/17 1256      PT LONG TERM GOAL #1   Title  Pt and wife will demonstrate independence with HEP    Time  8    Period  Weeks    Status  New    Target Date  12/18/16      PT LONG TERM GOAL #2   Title  Pt will decrease TUG time by  seconds with LRAD to </=47 sec. to decr. falls risk.    Baseline  62.62 sec. with RW and R hand  orthosis on RW    Time  8    Period  Weeks    Status  New    Target Date  12/18/16      PT LONG TERM GOAL #3   Title  Pt will improve LE strength as indicated by decrease in five time sit to stand to <12 sec.    Baseline  17.27 seconds    Time  8    Period  Weeks    Status  New    Target Date  12/18/16      PT LONG TERM GOAL #4   Title  Pt will perform bed mobility and all transfers with LRAD MOD I    Time  8    Period  Weeks    Status  New    Target Date  12/18/16      PT LONG TERM GOAL #5   Title  Pt will ambulate >300 over indoor and paved outdoor surfaces and negotiate ramp with LRAD and supervision for more independent household and community mobility    Time  8    Period  Weeks    Status  New    Target Date  12/18/16      PT LONG TERM GOAL #6   Title  Pt will improve gait velocity to > or = 1.52f/sec with LRAD    Time  8    Period  Weeks    Status  New    Target Date  12/18/16      PT LONG TERM GOAL #7   Title  Pt will improve overall function on FOTO by 25 points    Baseline  28%    Time  8    Period  Weeks    Status  New    Target Date  12/18/16            Plan - 11/18/17 1200    Clinical Impression Statement  Today's skilled session focused on NMR during mat activities such as tall kneeling to engage core muscles and to improve weight shifting to the R side. Pt requires cues to improve lateral weight shifting to the R side and required seated rest breaks (5 minutes each) after each set of activities 2/2 fatigue. Continue with POC.     Rehab Potential  Good    PT Frequency  3x / week    PT Duration  8 weeks    PT Treatment/Interventions  ADLs/Self Care Home Management;Electrical Stimulation;DME Instruction;Gait training;Stair training;Functional mobility training;Therapeutic activities;Therapeutic exercise;Balance training;Neuromuscular re-education;Cognitive remediation;Patient/family education;Orthotic Fit/Training;Passive range of motion;Energy  conservation;Taping;Manual techniques    PT Next Visit Plan  NO ESTIM/BIONESS DUE TO RECENT SEIZURES PER DR. BOMQTT;  Continue ramp training with RW, stair training, SBQC for short distances. Could try NMR in supported quadruped    PT Home Exercise Plan  pt goals include mowing with his tractor and fishing on his boat - incorporate into balance activities    Consulted and Agree with Plan of Care  Patient;Family member/caregiver    Family Member Consulted  wife       Patient will benefit from skilled therapeutic intervention in order to improve the following deficits and impairments:  Abnormal gait, Cardiopulmonary status limiting activity, Decreased balance, Decreased cognition, Decreased endurance, Decreased mobility, Decreased strength, Difficulty walking, Increased muscle spasms, Impaired sensation, Impaired tone, Impaired UE functional use, Postural dysfunction, Pain, Decreased range of motion, Increased edema  Visit Diagnosis: Other abnormalities of gait and mobility  Muscle weakness (generalized)  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 Partial seizure ([place]) 11/08/2017  . Positive colorectal cancer screening using Cologuard test 10/22/2017  . Spastic hemiparesis ([place])   . Cough   . Nontraumatic hemorrhage of left cerebral hemisphere ([place])   . Pain   . Hyponatremia   . Benign essential HTN   . 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 Gait disturbance, post-stroke   . Aphasia as late effect of stroke   . SAH (subarachnoid hemorrhage) ([place]) 09/02/2017  . B12 deficiency 09/02/2017  . Well adult exam 06/16/2016  . Cigarette smoker 02/23/2016  . COPD GOLD II  12/24/2014  . CAD (coronary artery disease) 07/22/2011  . HTN (hypertension)  07/22/2011  . Dyslipidemia 07/22/2011  . Smoker 07/22/2011    Miller,Jennifer L 11/18/2017, 12:04 PM  [facility] [address] [place], [place], [address] Phone: [phone]   Fax:  [phone]  Name: Jaime Pierce MRN: [id number] Date of Birth: [birth year]  Geoffry Paradise, PT,DPT 11/18/17 12:05 PM Phone: [phone] Fax: [phone]

## 2017-11-18 NOTE — Therapy (Signed)
[facility] [address] [place], [place], [address] Phone: [phone]   Fax:  [phone]  Physical Therapy Treatment  Patient Details  Name: Jaime Pierce MRN: [id number] Date of Birth: [birth year] Referring Provider: Meredith Staggers, MD   Encounter Date: 11/17/2017  PT End of Session - 11/17/17 2033    Visit Number  11    Number of Visits  25    Date for PT Re-Evaluation  12/18/17    Authorization Type  [employer] - G code and PN every 10th visit    PT Start Time  1103    PT Stop Time  1150    PT Time Calculation (min)  47 min    Activity Tolerance  Patient tolerated treatment well    Behavior During Therapy  [facility] for tasks assessed/performed       Past Medical History:  Diagnosis Date  . Coronary artery disease   . Dyspnea on exertion   . Hemorrhagic stroke ([place])   . Hyperlipidemia   . Hypertension   . Tobacco abuse     Past Surgical History:  Procedure Laterality Date  . CORONARY ARTERY BYPASS GRAFT      There were no vitals filed for this visit.  Subjective Assessment - 11/17/17 1102    Subjective  He fell Saturday morning. He tried to walk when wife was walking the dogs. He is uncertain if his right shoulder is weak. HIs wife assisted with getting up     Patient is accompained by:  Family member    Pertinent History  CAD with CABG, DOE, hyperlipidemia, HTN and tobacco abuse    Limitations  Standing;Walking    Patient Stated Goals  To be able to do the things he did before: mow with his tractor, get into and out of the boat for fishing!    Currently in Pain?  No/denies                      [facility] Adult PT Treatment/Exercise - 11/17/17 1100      Transfers   Transfers  Sit to Stand;Stand to [employer]    Sit to Stand  5: Supervision;With upper extremity assist;From chair/3-in-1;With armrests    Sit to Stand Details  Verbal cues for technique;Tactile cues  for weight shifting    Stand to Sit  5: Supervision    Stand to Sit Details (indicate cue type and reason)  Verbal cues for technique;Verbal cues for precautions/safety;Tactile cues for weight shifting    Stand to Sit Details  verbal cues on Rt hand placement to avoid sitting or injurying his UE    Stand Pivot Transfers  5: Supervision;With armrests with RW    Stand Pivot Transfer Details (indicate cue type and reason)  verbal cues on RUE safety      Ambulation/Gait   Ambulation/Gait  Yes    Ambulation/Gait Assistance  4: Min assist;4: Min guard    Ambulation/Gait Assistance Details  verbal cues on posture & step length, tactile cues on weight shift.  Gait with SBQC, verbal cues on sequence. initially MinA to stabilize SBQC progressed to no assist on [facility] & manual assist for balance & weight shift.     Ambulation Distance (Feet)  250 Feet 250' with RW & 25' with [facility]    Assistive device  Rolling walker    Gait Pattern  Step-through pattern;Decreased stride length;Decreased stance time - right    Ambulation Surface  Indoor;Level    Gait velocity  0.61 ft/sec    Ramp  4: Min assist RW    Ramp Details (indicate cue type and reason)  verbal & manual cues on wt shift & posture             PT Education - 11/17/17 1115    Education provided  Yes    Education Details  PT reviewed need for family assist / supervision for all standing & gait to prevent falls. Increase activity level by 1)high frequency of short walks (room to room), 2)4-6 medium walks /day  3) 1 long (his max tolerance) /day with wife following with w/c.  PT demo how to follow with w/c so wife can provide hands on assist/supervision.     Person(s) Educated  Patient;Spouse    Methods  Explanation;Demonstration;Verbal cues    Comprehension  Verbalized understanding;Need further instruction       PT Short Term Goals - 11/17/17 2035      PT SHORT TERM GOAL #1   Title  Pt will participate in further assessment of balance with  TUG and five time sit to stand with RW    Time  4    Period  Weeks    Status  Achieved      PT SHORT TERM GOAL #2   Title  Pt will perform sit <> stand and stand pivot transfers with LRAD and supervision with <10% cues for safety/sequencing    Baseline  MET 11/17/2017    Time  4    Period  Weeks    Status  Achieved      PT SHORT TERM GOAL #3   Title  Pt will ambulate x 250' on level, indoor surfaces (to/from waiting area) and will negotiate short ramp to simulate home entry/exit with LRAD and min A     Baseline  MET 11/17/2017    Time  4    Period  Weeks    Status  Achieved      PT SHORT TERM GOAL #4   Title  Pt will improve gait velocity to > or = 1.0 ft/sec    Baseline  NOT MET 11/17/2017  Gait Velocity 0.61 ft/sec    Time  4    Period  Weeks    Status  Not Met      PT SHORT TERM GOAL #5   Title  Pt will initiate gait training with cane or lesser AD over level, indoor surfaces with therapy    Baseline  MET 11/17/2017    Time  4    Period  Weeks    Status  Achieved        PT Long Term Goals - 10/28/17 1256      PT LONG TERM GOAL #1   Title  Pt and wife will demonstrate independence with HEP    Time  8    Period  Weeks    Status  New    Target Date  12/18/16      PT LONG TERM GOAL #2   Title  Pt will decrease TUG time by  seconds with LRAD to </=47 sec. to decr. falls risk.    Baseline  62.62 sec. with RW and R hand orthosis on RW    Time  8    Period  Weeks    Status  New    Target Date  12/18/16      PT LONG TERM GOAL #3   Title  Pt will  improve LE strength as indicated by decrease in five time sit to stand to <12 sec.    Baseline  17.27 seconds    Time  8    Period  Weeks    Status  New    Target Date  12/18/16      PT LONG TERM GOAL #4   Title  Pt will perform bed mobility and all transfers with LRAD MOD I    Time  8    Period  Weeks    Status  New    Target Date  12/18/16      PT LONG TERM GOAL #5   Title  Pt will ambulate >300 over indoor and  paved outdoor surfaces and negotiate ramp with LRAD and supervision for more independent household and community mobility    Time  8    Period  Weeks    Status  New    Target Date  12/18/16      PT LONG TERM GOAL #6   Title  Pt will improve gait velocity to > or = 1.78f/sec with LRAD    Time  8    Period  Weeks    Status  New    Target Date  12/18/16      PT LONG TERM GOAL #7   Title  Pt will improve overall function on FOTO by 25 points    Baseline  28%    Time  8    Period  Weeks    Status  New    Target Date  12/18/16            Plan - 11/17/17 2039    Clinical Impression Statement  Patient met 4 of 5 STGs set for first 30 days. Patient and wife appear to understand recommendation for ambulating with assist & improving activity tolerance.     Rehab Potential  Good    PT Frequency  3x / week    PT Duration  8 weeks    PT Treatment/Interventions  ADLs/Self Care Home Management;Electrical Stimulation;DME Instruction;Gait training;Stair training;Functional mobility training;Therapeutic activities;Therapeutic exercise;Balance training;Neuromuscular re-education;Cognitive remediation;Patient/family education;Orthotic Fit/Training;Passive range of motion;Energy conservation;Taping;Manual techniques    PT Next Visit Plan  NO ESTIM/BIONESS DUE TO RECENT SEIZURES PER DR. SVELFYB  Continue ramp training with RW, stair training, SBQC for short distances. Could try NMR in supported quadruped    PT Home Exercise Plan  pt goals include mowing with his tractor and fishing on his boat - incorporate into balance activities    Consulted and Agree with Plan of Care  Patient;Family member/caregiver    Family Member Consulted  wife       Patient will benefit from skilled therapeutic intervention in order to improve the following deficits and impairments:  Abnormal gait, Cardiopulmonary status limiting activity, Decreased balance, Decreased cognition, Decreased endurance, Decreased mobility,  Decreased strength, Difficulty walking, Increased muscle spasms, Impaired sensation, Impaired tone, Impaired UE functional use, Postural dysfunction, Pain, Decreased range of motion, Increased edema  Visit Diagnosis: Muscle weakness (generalized)  Other abnormalities of gait and mobility  Unsteadiness on feet     Problem List Patient Active Problem List   Diagnosis Date Noted  . Partial seizure ([place] 11/08/2017  . Positive colorectal cancer screening using Cologuard test 10/22/2017  . 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 Cough   . 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 Pain   . Hyponatremia   . Benign essential HTN   . 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 Gait disturbance, post-stroke   . Aphasia as late effect of stroke   . SAH (subarachnoid hemorrhage) ([place]) 09/02/2017  . B12 deficiency 09/02/2017  . Well adult exam 06/16/2016  . Cigarette smoker 02/23/2016  . COPD GOLD II  12/24/2014  . CAD (coronary artery disease) 07/22/2011  . HTN (hypertension) 07/22/2011  . Dyslipidemia 07/22/2011  . Smoker 07/22/2011    Jamey Reas PT, DPT 11/18/2017, 12:44 AM  [facility] [address] [place], [place], [address] Phone: [phone]   Fax:  [phone]  Name: Jaime Pierce MRN: [id number] Date of Birth: 05/22/1946

## 2017-11-18 NOTE — Therapy (Signed)
[facility] [address] [place], [place], [address] Phone: [phone]   Fax:  [phone]  Occupational Therapy Treatment  Patient Details  Name: Jaime Pierce MRN: [id number] Date of Birth: 1946-08-31 Referring Provider (Historical): Dr. Dow AdolphZachery Swartz   Encounter Date: 11/18/2017  OT End of Session - 11/18/17 1022    Visit Number  11    Number of Visits  24    Date for OT Re-Evaluation  12/18/17    Authorization Type  [employer] MCR - G code required    Authorization - Visit Number  11    Authorization - Number of Visits  20    OT Start Time  1022    OT Stop Time  1102    OT Time Calculation (min)  40 min    Activity Tolerance  Patient tolerated treatment well    Behavior During Therapy  [facility] for tasks assessed/performed       Past Medical History:  Diagnosis Date  . Coronary artery disease   . Dyspnea on exertion   . Hemorrhagic stroke ([facility])   . Hyperlipidemia   . Hypertension   . Tobacco abuse     Past Surgical History:  Procedure Laterality Date  . CORONARY ARTERY BYPASS GRAFT      There were no vitals filed for this visit.  Subjective Assessment - 11/18/17 1021    Subjective   Wife reports that pt continues to put RUE across body    Patient is accompained by:  Family member    Pertinent History  ICH Lt parietal lobe 09/01/17. PMH: CAD, SAH, COPD, s/p CABG x 6    Limitations  Recent partial seizure 10/23/17 - no estim unless specific MD clearance, no driving for 6 months    Patient Stated Goals  be independent again    Currently in Pain?  No/denies    Pain Onset  More than a month ago        In supine, gentle joint mobs to R shoulder due to pain.  Followed by PROM shoulder flex and ER, then AAROM shoulder flex with mod cueing for normal movement patterns/decr tone and spasticity patterns and ER AAROM with mod cueing.  Progressed to roll to R side for wt. Bearing followed by supine>sit with  emphasis/mod cueing normal movement patterns.    In sitting, anterior pelvic tilts with scapular retraction/depression with mod cueing/facilitation to trunk/neck/scapula.  Wt. Bearing with R hand on mat with body on arm movements/LUE reaching with mod cueing/min facilitation for normal movement patterns.  Low range functional reach to retrieve cylinder object from floor with min-mod cueing, min facilitation for normal movement patterns and mod difficulty with cylinder grasp.  Instructed pt/wife how to perform at home.                    OT Education - 11/18/17 1310    Education Details  normal movement patterns and proper positioning for RUE movement and in rest    Person(s) Educated  Patient;Spouse    Methods  Explanation;Demonstration    Comprehension  Verbalized understanding;Returned demonstration;Need further instruction       OT Short Term Goals - 11/11/17 1618      OT SHORT TERM GOAL #1   Title  Independent with initial HEP     Time  4    Period  Weeks    Status  On-going      OT SHORT TERM GOAL #2   Title  Pt/wife to verbalize understanding with edema management (including compression glove) and safety considerations w/ lack of sensation Rt hand    Time  4    Period  Weeks    Status  Achieved      OT SHORT TERM GOAL #3   Title  Pt to demo 25 degrees shoulder flexion in prep for low level reaching RUE    Time  4    Period  Weeks    Status  Achieved      OT SHORT TERM GOAL #4   Title  Pt to consistently be mod I level for dressing and bathing with A/E and task modifications prn    Time  4    Period  Weeks    Status  On-going      OT SHORT TERM GOAL #5   Title  Pt to use Rt hand to feed self finger foods 50% of the time    Time  4    Period  Weeks    Status  On-going      OT SHORT TERM GOAL #6   Title  Pt to perform shower transfer with CGA only using DME prn    Time  4    Period  Weeks    Status  On-going        OT Long Term Goals -  10/19/17 1055      OT LONG TERM GOAL #1   Title  Pt to be independent with updated HEP     Time  8    Period  Weeks    Status  New    Target Date  12/18/17      OT LONG TERM GOAL #2   Title  Pt to demo shoulder flexion to 40 degrees or greater for consistent low level reaching    Time  8    Period  Weeks    Status  New      OT LONG TERM GOAL #3   Title  Pt to improve RUE functional use as evidenced by performing 10 blocks on [employer] test    Time  8    Period  Weeks    Status  New      OT LONG TERM GOAL #4   Title  Pt to use Rt hand to write name at 75% legibility or greater with A/E prn    Time  8    Period  Weeks    Status  New      OT LONG TERM GOAL #5   Title  Pt to use Rt hand for eating and grooming 75% of the time    Time  8    Period  Weeks    Status  New      Long Term Additional Goals   Additional Long Term Goals  Yes      OT LONG TERM GOAL #6   Title  Pt to return to simple cooking task with distant supervision and A/E prn    Time  8    Period  Weeks    Status  New            Plan - 11/18/17 1023    Clinical Impression Statement  Pt is progressing with improving RUE movement in prep for functional use, but cognitive deficits and R inattention are limiting factors.    Occupational Profile and client history currently impacting functional performance  PMH: CAD, HTN, SAH, s/p CABG x 6, COPD  Occupational performance deficits (Please refer to evaluation for details):  ADL's;IADL's;Leisure;Social Participation    Rehab Potential  Good    OT Frequency  3x / week    OT Duration  8 weeks    OT Treatment/Interventions  Self-care/ADL training;Moist Heat;DME and/or AE instruction;Splinting;Contrast Bath;Compression bandaging;Therapeutic activities;Ultrasound;Therapeutic exercise;Cognitive remediation/compensation;Neuromuscular education;Functional Mobility Training;Passive range of motion;Visual/perceptual remediation/compensation;Electrical  Stimulation;Paraffin;Manual Therapy;Patient/family education    Plan  NMR RUE/ trunk    Consulted and Agree with Plan of Care  Patient;Family member/caregiver    Family Member Consulted  wife       Patient will benefit from skilled therapeutic intervention in order to improve the following deficits and impairments:  Decreased coordination, Decreased range of motion, Increased edema, Impaired sensation, Decreased knowledge of precautions, Impaired tone, Decreased activity tolerance, Decreased balance, Decreased knowledge of use of DME, Impaired UE functional use, Pain, Decreased cognition, Decreased mobility, Decreased strength, Impaired vision/preception  Visit Diagnosis: 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)  Unsteadiness on feet  Other symptoms and signs involving cognitive functions following other nontraumatic intracranial hemorrhage  Other lack of coordination  Acute pain of right shoulder  Other disturbances of skin sensation  Other abnormalities of gait and mobility  G-Codes - 11/17/17 1611    Functional Assessment Tool Used (Outpatient only)  RUE: pt uses minimally/ and requires v.c to attend to RUE    Carrying, Moving and Handling Objects Current Status [phone]([id number])  At least 80 percent but less than 100 percent impaired, limited or restricted    Carrying, Moving and Handling Objects Goal Status ([id number])  At least 80 percent but less than 100 percent impaired, limited or restricted       Problem List Patient Active Problem List   Diagnosis Date Noted  . Partial seizure (HCC) 11/08/2017  . Positive colorectal cancer screening using Cologuard test 10/22/2017  . Spastic hemiparesis (H[facility])   . Cough   . 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 Pain   . Hyponatremia   . Benign essential HTN   . Hemiparesis of right dominant side as late effect of nontraumatic intracerebral  hemorrhage (H[facility])   . Gait disturbance, post-stroke   . Aphasia as late effect of stroke   . SAH (subarachnoid hemorrhage) (H[facility]) 09/02/2017  . B12 deficiency 09/02/2017  . Well adult exam 06/16/2016  . Cigarette smoker 02/23/2016  . COPD GOLD II  12/24/2014  . CAD (coronary artery disease) 07/22/2011  . HTN (hypertension) 07/22/2011  . Dyslipidemia 07/22/2011  . Smoker 07/22/2011    [facility]landFREEMAN,Magdalen Cabana 11/18/2017, 1:13 PM  [facility] [address] [place], [place], [address] Phone: [phone]   Fax:  [phone]  Name: Jaime Pierce MRN: [id number] Date of Birth: [birth year]   Willa FraterAngela Brack Shaddock, OTR/L [facility] [address] [place], [place]  [address] [phone] 11/18/17 1:13 PM

## 2017-11-18 NOTE — Therapy (Signed)
[facility] [address] [place], [place], [address] Phone: [phone]   Fax:  [phone]  Speech Language Pathology Treatment  Patient Details  Name: Jaime CUDWORTH MRN: [id number] Date of Birth: 11-Nov-1946 Referring Provider: Faith Rogue, MD   Encounter Date: 11/18/2017  End of Session - 11/18/17 1223    Visit Number  8    Number of Visits  25    Date for SLP Re-Evaluation  12/31/17    SLP Start Time  0933    SLP Stop Time   1015    SLP Time Calculation (min)  42 min    Activity Tolerance  Patient tolerated treatment well;Patient limited by fatigue       Past Medical History:  Diagnosis Date  . Coronary artery disease   . Dyspnea on exertion   . Hemorrhagic stroke (HCC)   . Hyperlipidemia   . Hypertension   . Tobacco abuse     Past Surgical History:  Procedure Laterality Date  . CORONARY ARTERY BYPASS GRAFT      There were no vitals filed for this visit.  Subjective Assessment - 11/18/17 0939    Subjective  "I don't know, same ole same ole" re: what he'd been working on in ST    Currently in Pain?  No/denies            ADULT SLP TREATMENT - 11/18/17 0941      General Information   Behavior/Cognition  Alert;Cooperative;Pleasant mood;Distractible    Patient Positioning  Upright in chair      Treatment Provided   Treatment provided  Cognitive-Linquistic      Pain Assessment   Pain Assessment  No/denies pain      Cognitive-Linquistic Treatment   Treatment focused on  Cognition    Skilled Treatment  Pt fell on Saturday, no injuries reported. Pt acknowledged he fell when he "shouldn't have" as he was moving around without assistance/supervision. Simple math word problems 100% accuracy today. SLP increased task complexity slightly (word problems with use of transporation schedule) and pt accuracy fell to 40%, with mod-max A required for attention, reasoning. Pt stated he would not  need to read transportation schedule. SLP educated re: underlying cognitive processes being targeted during this task and explained pt would require these skills in other tasks. [name] mentioned pt used to plan vacations and road trips. SLP demo'd having pt "walk through" planning a simple road trip using the internet. Pt required cues for reasoning re: search topic choices to locate his destination on a map. Pt answered simple math/reasoning questions re: route choice with 75% accuracy, cue x1 for error awareness. SLP encouraged pt and his wife to try similar tasks at home.      Assessment / Recommendations / Plan   Plan  Continue with current plan of care      Progression Toward Goals   Progression toward goals  Progressing toward goals       SLP Education - 11/18/17 1222    Education provided  Yes    Education Details  attention, reasoning skills needed in everyday tasks    Person(s) Educated  Patient;Spouse    Methods  Explanation;Verbal cues       SLP Short Term Goals - 11/18/17 1224      SLP SHORT TERM GOAL #1   Title  pt will demo emergent awareness 90% of the time in mod complex cognitive linguistic tasks    Time  2  Period  Weeks    Status  On-going      SLP SHORT TERM GOAL #2   Title  pt will recall memory strategies (WARM) with modified independence over three sessions    Time  2    Period  Weeks    Status  On-going      SLP SHORT TERM GOAL #3   Title  pt will tell SLP three non-physical deficits over two sessions    Time  2    Period  Weeks    Status  On-going      SLP SHORT TERM GOAL #4   Title  pt will complete min-mod complex reasoning/problem solving tasks involving money/time/etc with 95% success over three sessions    Time  2    Period  Weeks    Status  On-going       SLP Long Term Goals - 11/18/17 1225      SLP LONG TERM GOAL #1   Title  pt will use 1 memory strategy in 5 therapy sessions    Time  6    Period  Weeks    Status  On-going       SLP LONG TERM GOAL #2   Title  pt will demo [facility] executive function skills when presented with problems in everyday tasks over three sessions, with SBA    Time  6    Period  Weeks    Status  On-going      SLP LONG TERM GOAL #3   Title  pt will demo simple divided attention with 90% success on both tasks over three sessions    Time  6    Period  Weeks    Status  On-going      SLP LONG TERM GOAL #4   Title  pt will use anticipatory awareness by double checking written cognitive linguistic tasks 100% of the time in 4 sessions    Time  6    Period  Weeks    Status  On-going       Plan - 11/18/17 1223    Clinical Impression Statement  Pt cont to exhibit cognitive deficits in attention, working memory with simple to mod complex auditory info, and emergent and anticipatory awareness. Working memory appears to be improving as seen in simple math word problems today. Pt will benefit from skilled ST to address these deficits in order to return pt to PLOF.    Speech Therapy Frequency  3x / week    Treatment/Interventions  SLP instruction and feedback;Compensatory strategies;Patient/family education;Internal/external aids;Cognitive reorganization;Cueing hierarchy;Environmental controls;Language facilitation;Functional tasks    Potential to Achieve Goals  Good    Consulted and Agree with Plan of Care  Patient       Patient will benefit from skilled therapeutic intervention in order to improve the following deficits and impairments:   Cognitive communication deficit    Problem List Patient Active Problem List   Diagnosis Date Noted  . Partial seizure (HCC) 11/08/2017  . Positive colorectal cancer screening using Cologuard test 10/22/2017  . Spastic hemiparesis (HCC)   . Cough   . 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 Pain   . Hyponatremia   . Benign essential HTN   . Hemiparesis of right dominant side as late effect of nontraumatic intracerebral hemorrhage  (HCC)   . Gait disturbance, post-stroke   . Aphasia as late effect of stroke   . SAH (subarachnoid hemorrhage) (HCC) 09/02/2017  . B12 deficiency  09/02/2017  . Well adult exam 06/16/2016  . Cigarette smoker 02/23/2016  . COPD GOLD II  12/24/2014  . CAD (coronary artery disease) 07/22/2011  . HTN (hypertension) 07/22/2011  . Dyslipidemia 07/22/2011  . Smoker 07/22/2011   Rondel BatonMary Beth Jaida Basurto, MS, CCC-SLP Speech-Language Pathologist  Arlana LindauMary E Mclain Freer 11/18/2017, 12:26 PM  [facility] [address] [place], [place], [address] Phone: [phone]   Fax:  [phone]   Name: Delora FuelRichard A Arenivas MRN: [id number] Date of Birth: November 05, 1946

## 2017-11-19 ENCOUNTER — Ambulatory Visit: Payer: Medicare HMO

## 2017-11-19 ENCOUNTER — Ambulatory Visit: Payer: Medicare HMO | Admitting: Occupational Therapy

## 2017-11-19 DIAGNOSIS — M6281 Muscle weakness (generalized): Secondary | ICD-10-CM

## 2017-11-19 DIAGNOSIS — I69351 Hemiplegia and hemiparesis following cerebral infarction affecting right dominant side: Secondary | ICD-10-CM | POA: Diagnosis not present

## 2017-11-19 DIAGNOSIS — R278 Other lack of coordination: Secondary | ICD-10-CM

## 2017-11-19 DIAGNOSIS — I69251 Hemiplegia and hemiparesis following other nontraumatic intracranial hemorrhage affecting right dominant side: Secondary | ICD-10-CM

## 2017-11-19 DIAGNOSIS — R2689 Other abnormalities of gait and mobility: Secondary | ICD-10-CM

## 2017-11-19 DIAGNOSIS — R208 Other disturbances of skin sensation: Secondary | ICD-10-CM

## 2017-11-19 DIAGNOSIS — I69218 Other symptoms and signs involving cognitive functions following other nontraumatic intracranial hemorrhage: Secondary | ICD-10-CM

## 2017-11-19 DIAGNOSIS — R2681 Unsteadiness on feet: Secondary | ICD-10-CM

## 2017-11-19 NOTE — Therapy (Signed)
[facility] [address] [place], [place], [address] Phone: [phone]   Fax:  [phone]  Occupational Therapy Treatment  Patient Details  Name: Jaime Pierce MRN: [id number] Date of Birth: May 13, 1946 Referring Provider (Historical): Dr. Dow Adolph   Encounter Date: 11/19/2017  OT End of Session - 11/19/17 1255    Visit Number  12    Number of Visits  24    Date for OT Re-Evaluation  12/18/17    Authorization Type  Aetna MCR - G code required    Authorization - Visit Number  12    Authorization - Number of Visits  20    OT Start Time  0850    OT Stop Time  0930    OT Time Calculation (min)  40 min    Activity Tolerance  Patient tolerated treatment well    Behavior During Therapy  [facility] for tasks assessed/performed       Past Medical History:  Diagnosis Date  . Coronary artery disease   . Dyspnea on exertion   . Hemorrhagic stroke (HCC)   . Hyperlipidemia   . Hypertension   . Tobacco abuse     Past Surgical History:  Procedure Laterality Date  . CORONARY ARTERY BYPASS GRAFT      There were no vitals filed for this visit.  Subjective Assessment - 11/19/17 1254    Pertinent History  ICH Lt parietal lobe 09/01/17. PMH: CAD, SAH, COPD, s/p CABG x 6    Limitations  Recent partial seizure 10/23/17 - no estim unless specific MD clearance, no driving for 6 months    Patient Stated Goals  be independent again    Currently in Pain?  No/denies             Treatment:Supine AA/ROM shoulder flexion min/ mod facilitation. Seated shoulder flexion while weightbearing through UE ranger stool, min- mod facilitation. Weightbearing through  Right elbow on mat with body on arm movements, max facilitation Weightbearing edge of mat through RUE, min facilitation/ v.  picking up cones from mat to place on low stool with RUE, min-mod v.c/ facilitation.                  OT Short Term Goals -  11/19/17 1257      OT SHORT TERM GOAL #1   Title  Independent with initial HEP     Status  On-going      OT SHORT TERM GOAL #2   Title  Pt/wife to verbalize understanding with edema management (including compression glove) and safety considerations w/ lack of sensation Rt hand    Status  Achieved      OT SHORT TERM GOAL #3   Title  Pt to demo 25 degrees shoulder flexion in prep for low level reaching RUE    Status  Achieved      OT SHORT TERM GOAL #4   Title  Pt to consistently be mod I level for dressing and bathing with A/E and task modifications prn    Status  On-going      OT SHORT TERM GOAL #5   Title  Pt to use Rt hand to feed self finger foods 50% of the time    Status  On-going      OT SHORT TERM GOAL #6   Title  Pt to perform shower transfer with CGA only using DME prn    Status  On-going        OT Long  Term Goals - 10/19/17 1055      OT LONG TERM GOAL #1   Title  Pt to be independent with updated HEP     Time  8    Period  Weeks    Status  New    Target Date  12/18/17      OT LONG TERM GOAL #2   Title  Pt to demo shoulder flexion to 40 degrees or greater for consistent low level reaching    Time  8    Period  Weeks    Status  New      OT LONG TERM GOAL #3   Title  Pt to improve RUE functional use as evidenced by performing 10 blocks on Box & Blocks test    Time  8    Period  Weeks    Status  New      OT LONG TERM GOAL #4   Title  Pt to use Rt hand to write name at 75% legibility or greater with A/E prn    Time  8    Period  Weeks    Status  New      OT LONG TERM GOAL #5   Title  Pt to use Rt hand for eating and grooming 75% of the time    Time  8    Period  Weeks    Status  New      Long Term Additional Goals   Additional Long Term Goals  Yes      OT LONG TERM GOAL #6   Title  Pt to return to simple cooking task with distant supervision and A/E prn    Time  8    Period  Weeks    Status  New            Plan - 11/19/17 1258     Clinical Impression Statement  Pt is progressing with improving RUE movement in prep for functional use however pt remains limited by  cognitive deficits and R inattention.    Occupational Profile and client history currently impacting functional performance  PMH: CAD, HTN, SAH, s/p CABG x 6, COPD    Occupational performance deficits (Please refer to evaluation for details):  ADL's;IADL's;Leisure;Social Participation    Rehab Potential  Good    OT Frequency  3x / week    OT Duration  8 weeks    OT Treatment/Interventions  Self-care/ADL training;Moist Heat;DME and/or AE instruction;Splinting;Contrast Bath;Compression bandaging;Therapeutic activities;Ultrasound;Therapeutic exercise;Cognitive remediation/compensation;Neuromuscular education;Functional Mobility Training;Passive range of motion;Visual/perceptual remediation/compensation;Electrical Stimulation;Paraffin;Manual Therapy;Patient/family education    Plan  NMR RUE/ trunk    Consulted and Agree with Plan of Care  Patient    Family Member Consulted  wife       Patient will benefit from skilled therapeutic intervention in order to improve the following deficits and impairments:  Decreased coordination, Decreased range of motion, Increased edema, Impaired sensation, Decreased knowledge of precautions, Impaired tone, Decreased activity tolerance, Decreased balance, Decreased knowledge of use of DME, Impaired UE functional use, Pain, Decreased cognition, Decreased mobility, Decreased strength, Impaired vision/preception  Visit Diagnosis: 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 Partial seizure (HCC) 11/08/2017  . Positive colorectal cancer screening using Cologuard test 10/22/2017  . Spastic  hemiparesis (HCC)   . Cough   .  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 Pain   . Hyponatremia   . Benign essential HTN   . 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 Gait disturbance, post-stroke   . Aphasia as late effect of stroke   . SAH (subarachnoid hemorrhage) (HCC) 09/02/2017  . B12 deficiency 09/02/2017  . Well adult exam 06/16/2016  . Cigarette smoker 02/23/2016  . COPD GOLD II  12/24/2014  . CAD (coronary artery disease) 07/22/2011  . HTN (hypertension) 07/22/2011  . Dyslipidemia 07/22/2011  . Smoker 07/22/2011    RINE,KATHRYN 11/19/2017, 1:00 PM  [facility] [address] [place], [place], [address] Phone: [phone]   Fax:  [phone]  Name: Jaime Pierce MRN: [id number] Date of Birth: 1946/11/15

## 2017-11-19 NOTE — Therapy (Signed)
[facility] [address] [place], [place], [address] Phone: [phone]   Fax:  [phone]  Physical Therapy Treatment  Patient Details  Name: Jaime Pierce MRN: [id number] Date of Birth: [birth year] Referring Provider: Meredith Staggers, MD   Encounter Date: 11/19/2017  PT End of Session - 11/19/17 1019    Visit Number  13    Number of Visits  25    Date for PT Re-Evaluation  12/18/17    Authorization Type  Aetna Medicare - G code and PN every 10th visit    PT Start Time  0931    PT Stop Time  1013    PT Time Calculation (min)  42 min    Equipment Utilized During Treatment  Gait belt    Activity Tolerance  Patient tolerated treatment well    Behavior During Therapy  [facility] for tasks assessed/performed       Past Medical History:  Diagnosis Date  . Coronary artery disease   . Dyspnea on exertion   . Hemorrhagic stroke ([place])   . Hyperlipidemia   . Hypertension   . Tobacco abuse     Past Surgical History:  Procedure Laterality Date  . CORONARY ARTERY BYPASS GRAFT      There were no vitals filed for this visit.  Subjective Assessment - 11/19/17 0934    Subjective  Pt denied falls or changes since last visit.     Patient is accompained by:  Family member wife: Butch Penny    Pertinent History  CAD with CABG, DOE, hyperlipidemia, HTN and tobacco abuse    Patient Stated Goals  To be able to do the things he did before: mow with his tractor, get into and out of the boat for fishing!    Currently in Pain?  No/denies                      [facility] Adult PT Treatment/Exercise - 11/19/17 0934      Ambulation/Gait   Ambulation/Gait  Yes    Ambulation/Gait Assistance  5: Supervision    Ambulation/Gait Assistance Details  Cues to improve narrow BOS, posture, and heel strike. Pt amb. with R ant. AFO (toe off and reaction) and without AFO, pt amb. with simulated leather toe cap with R toe off.     Ambulation Distance (Feet)  75 x 2 Feet no AFO, 115' c R reaction (ant), 50'x2 with R toe off    Assistive device  Rolling walker    Gait Pattern  Step-through pattern;Decreased stride length;Decreased stance time - right    Ambulation Surface  Level;Indoor    Ramp  Other (comment) min guard    Ramp Details (indicate cue type and reason)  Cues to improve weight shifting on incline/decline ,performed x2 reps.             PT Education - 11/19/17 1018    Education provided  Yes    Education Details  PT discussed different AFO and toe cap options to improve R foot clearance, and that PT will continue to trial difference options.     Person(s) Educated  Patient;Spouse    Methods  Explanation    Comprehension  Verbalized understanding       PT Short Term Goals - 11/17/17 2035      PT SHORT TERM GOAL #1   Title  Pt will participate in further assessment of balance with TUG and five time sit to stand with RW  Time  4    Period  Weeks    Status  Achieved      PT SHORT TERM GOAL #2   Title  Pt will perform sit <> stand and stand pivot transfers with LRAD and supervision with <10% cues for safety/sequencing    Baseline  MET 11/17/2017    Time  4    Period  Weeks    Status  Achieved      PT SHORT TERM GOAL #3   Title  Pt will ambulate x 250' on level, indoor surfaces (to/from waiting area) and will negotiate short ramp to simulate home entry/exit with LRAD and min A     Baseline  MET 11/17/2017    Time  4    Period  Weeks    Status  Achieved      PT SHORT TERM GOAL #4   Title  Pt will improve gait velocity to > or = 1.0 ft/sec    Baseline  NOT MET 11/17/2017  Gait Velocity 0.61 ft/sec    Time  4    Period  Weeks    Status  Not Met      PT SHORT TERM GOAL #5   Title  Pt will initiate gait training with cane or lesser AD over level, indoor surfaces with therapy    Baseline  MET 11/17/2017    Time  4    Period  Weeks    Status  Achieved        PT Long Term Goals -  10/28/17 1256      PT LONG TERM GOAL #1   Title  Pt and wife will demonstrate independence with HEP    Time  8    Period  Weeks    Status  New    Target Date  12/18/16      PT LONG TERM GOAL #2   Title  Pt will decrease TUG time by  seconds with LRAD to </=47 sec. to decr. falls risk.    Baseline  62.62 sec. with RW and R hand orthosis on RW    Time  8    Period  Weeks    Status  New    Target Date  12/18/16      PT LONG TERM GOAL #3   Title  Pt will improve LE strength as indicated by decrease in five time sit to stand to <12 sec.    Baseline  17.27 seconds    Time  8    Period  Weeks    Status  New    Target Date  12/18/16      PT LONG TERM GOAL #4   Title  Pt will perform bed mobility and all transfers with LRAD MOD I    Time  8    Period  Weeks    Status  New    Target Date  12/18/16      PT LONG TERM GOAL #5   Title  Pt will ambulate >300 over indoor and paved outdoor surfaces and negotiate ramp with LRAD and supervision for more independent household and community mobility    Time  8    Period  Weeks    Status  New    Target Date  12/18/16      PT LONG TERM GOAL #6   Title  Pt will improve gait velocity to > or = 1.36f/sec with LRAD    Time  8    Period  Weeks  Status  New    Target Date  12/18/16      PT LONG TERM GOAL #7   Title  Pt will improve overall function on FOTO by 25 points    Baseline  28%    Time  8    Period  Weeks    Status  New    Target Date  12/18/16            Plan - 11/19/17 1019    Clinical Impression Statement  Pt demonstrated improved R toe clearance with R anterior AFO donned, with simulated toe cap. Foot clearance especially noted with R toe off but pt preferred R reaction AFO (ant). PT will continue to trial R AFOs to determine best fit for pt. Pt would continue to benefit from skilled PT to improve safety during functional mobility.     Rehab Potential  Good    PT Frequency  3x / week    PT Duration  8 weeks    PT  Treatment/Interventions  ADLs/Self Care Home Management;Electrical Stimulation;DME Instruction;Gait training;Stair training;Functional mobility training;Therapeutic activities;Therapeutic exercise;Balance training;Neuromuscular re-education;Cognitive remediation;Patient/family education;Orthotic Fit/Training;Passive range of motion;Energy conservation;Taping;Manual techniques    PT Next Visit Plan  NO ESTIM/BIONESS DUE TO RECENT SEIZURES PER DR. GMWNUU;  Trial R anterior AFOs (including blue rocker) with toe cap and determine best fit. Continue ramp training with RW, stair training, SBQC for short distances. Could try NMR in supported quadruped    PT Home Exercise Plan  pt goals include mowing with his tractor and fishing on his boat - incorporate into balance activities    Consulted and Agree with Plan of Care  Patient;Family member/caregiver    Family Member Consulted  wife       Patient will benefit from skilled therapeutic intervention in order to improve the following deficits and impairments:  Abnormal gait, Cardiopulmonary status limiting activity, Decreased balance, Decreased cognition, Decreased endurance, Decreased mobility, Decreased strength, Difficulty walking, Increased muscle spasms, Impaired sensation, Impaired tone, Impaired UE functional use, Postural dysfunction, Pain, Decreased range of motion, Increased edema  Visit Diagnosis: Other abnormalities of gait and mobility  Unsteadiness on feet  Other symptoms and signs involving cognitive functions following other nontraumatic intracranial hemorrhage  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 Partial seizure ([place]) 11/08/2017  . Positive colorectal cancer screening using Cologuard test 10/22/2017  . Spastic hemiparesis ([place])   . Cough   . Nontraumatic hemorrhage of left cerebral  hemisphere ([place])   . Pain   . Hyponatremia   . Benign essential HTN   . 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 Gait disturbance, post-stroke   . Aphasia as late effect of stroke   . SAH (subarachnoid hemorrhage) ([place]) 09/02/2017  . B12 deficiency 09/02/2017  . Well adult exam 06/16/2016  . Cigarette smoker 02/23/2016  . COPD GOLD II  12/24/2014  . CAD (coronary artery disease) 07/22/2011  . HTN (hypertension) 07/22/2011  . Dyslipidemia 07/22/2011  . Smoker 07/22/2011    Zayden Hahne L 11/19/2017, 10:21 AM  [facility] [address] [place], [place], [address] Phone: [phone]   Fax:  [phone]  Name: Jaime Pierce MRN: [id number] Date of Birth: March 12, 1946  Geoffry Paradise, PT,DPT 11/19/17 10:22 AM Phone: [phone] Fax: [phone]

## 2017-11-21 ENCOUNTER — Ambulatory Visit
Admission: RE | Admit: 2017-11-21 | Discharge: 2017-11-21 | Disposition: A | Discharge status: Home / Self Care | Payer: Medicare HMO | Source: Ambulatory Visit | Attending: Neurology | Admitting: Neurology

## 2017-11-21 DIAGNOSIS — I611 Nontraumatic intracerebral hemorrhage in hemisphere, cortical: Secondary | ICD-10-CM

## 2017-11-21 MED ORDER — GADOBENATE DIMEGLUMINE 529 MG/ML IV SOLN
15.0000 mL | Freq: Once | INTRAVENOUS | Status: AC | PRN
  Administered 2017-11-21: 15 mL via INTRAVENOUS

## 2017-11-24 ENCOUNTER — Ambulatory Visit: Payer: Medicare HMO

## 2017-11-24 ENCOUNTER — Ambulatory Visit: Payer: Medicare HMO | Admitting: Occupational Therapy

## 2017-11-24 ENCOUNTER — Ambulatory Visit: Payer: Medicare HMO | Admitting: Physical Therapy

## 2017-11-25 ENCOUNTER — Ambulatory Visit: Payer: Medicare HMO | Admitting: Occupational Therapy

## 2017-11-25 ENCOUNTER — Encounter: Payer: Self-pay | Admitting: Speech Pathology

## 2017-11-25 ENCOUNTER — Ambulatory Visit: Payer: Medicare HMO | Admitting: Neurology

## 2017-11-25 ENCOUNTER — Encounter: Payer: Self-pay | Admitting: Physical Therapy

## 2017-11-25 ENCOUNTER — Ambulatory Visit: Payer: Medicare HMO | Attending: Physical Medicine & Rehabilitation | Admitting: Physical Therapy

## 2017-11-25 ENCOUNTER — Other Ambulatory Visit: Payer: Self-pay

## 2017-11-25 ENCOUNTER — Ambulatory Visit: Payer: Medicare HMO | Admitting: Speech Pathology

## 2017-11-25 DIAGNOSIS — R41841 Cognitive communication deficit: Secondary | ICD-10-CM | POA: Diagnosis present

## 2017-11-25 DIAGNOSIS — R208 Other disturbances of skin sensation: Secondary | ICD-10-CM

## 2017-11-25 DIAGNOSIS — M25511 Pain in right shoulder: Secondary | ICD-10-CM

## 2017-11-25 DIAGNOSIS — I611 Nontraumatic intracerebral hemorrhage in hemisphere, cortical: Secondary | ICD-10-CM

## 2017-11-25 DIAGNOSIS — M6281 Muscle weakness (generalized): Secondary | ICD-10-CM | POA: Insufficient documentation

## 2017-11-25 DIAGNOSIS — R2689 Other abnormalities of gait and mobility: Secondary | ICD-10-CM

## 2017-11-25 DIAGNOSIS — R2681 Unsteadiness on feet: Secondary | ICD-10-CM | POA: Diagnosis present

## 2017-11-25 DIAGNOSIS — R278 Other lack of coordination: Secondary | ICD-10-CM | POA: Insufficient documentation

## 2017-11-25 DIAGNOSIS — I69251 Hemiplegia and hemiparesis following other nontraumatic intracranial hemorrhage affecting right dominant side: Secondary | ICD-10-CM

## 2017-11-25 DIAGNOSIS — I69218 Other symptoms and signs involving cognitive functions following other nontraumatic intracranial hemorrhage: Secondary | ICD-10-CM | POA: Insufficient documentation

## 2017-11-25 NOTE — Therapy (Signed)
[facility] [address] [place], [place], [address] Phone: [phone]   Fax:  [phone]  Occupational Therapy Treatment  Patient Details  Name: Jaime Pierce MRN: [id number] Date of Birth: 1946/05/25 Referring Provider (Historical): Dr. Dow Adolph   Encounter Date: 11/25/2017  OT End of Session - 11/25/17 1037    Visit Number  13    Number of Visits  24    Date for OT Re-Evaluation  12/18/17    Authorization Type  Aetna MCR - G code required    OT Start Time  0935    OT Stop Time  1015    OT Time Calculation (min)  40 min    Activity Tolerance  Patient tolerated treatment well    Behavior During Therapy  [facility] for tasks assessed/performed       Past Medical History:  Diagnosis Date  . Coronary artery disease   . Dyspnea on exertion   . Hemorrhagic stroke (HCC)   . Hyperlipidemia   . Hypertension   . Tobacco abuse     Past Surgical History:  Procedure Laterality Date  . CORONARY ARTERY BYPASS GRAFT      There were no vitals filed for this visit.  Subjective Assessment - 11/25/17 0939    Subjective   No pain now, but I had pain for 3 days after my last P.T. session when they put the AFO on me in therapy    Pertinent History  ICH Lt parietal lobe 09/01/17. PMH: CAD, SAH, COPD, s/p CABG x 6    Limitations  Recent partial seizure 10/23/17 - no estim unless specific MD clearance, no driving for 6 months    Patient Stated Goals  be independent again    Currently in Pain?  No/denies                   OT Treatments/Exercises (OP) - 11/25/17 0001      Neurological Re-education Exercises   Other Exercises 1  Supine: chest press with PVC frame with min assist RUE. Attempted sidelying for sh. flexion and extension with therapist providing scapula mobilization for upward rotation, however pt compensating and activating incorrect muscle groups which causes shoulder pain. Pt also with anterior  placed GH joint and elevated scapula. Pt also with limited mobility in thoracic spine and ribcage asymmetrical.     Other Exercises 2  Prone: scapula retraction x 10 reps w/ tactile cues for retraction and downward rotation. Pt noted to have inflamed ribcage and thorax on Rt hemiplegic side in prone.                OT Short Term Goals - 11/19/17 1257      OT SHORT TERM GOAL #1   Title  Independent with initial HEP     Status  On-going      OT SHORT TERM GOAL #2   Title  Pt/wife to verbalize understanding with edema management (including compression glove) and safety considerations w/ lack of sensation Rt hand    Status  Achieved      OT SHORT TERM GOAL #3   Title  Pt to demo 25 degrees shoulder flexion in prep for low level reaching RUE    Status  Achieved      OT SHORT TERM GOAL #4   Title  Pt to consistently be mod I level for dressing and bathing with A/E and task modifications prn    Status  On-going  OT SHORT TERM GOAL #5   Title  Pt to use Rt hand to feed self finger foods 50% of the time    Status  On-going      OT SHORT TERM GOAL #6   Title  Pt to perform shower transfer with CGA only using DME prn    Status  On-going        OT Long Term Goals - 10/19/17 1055      OT LONG TERM GOAL #1   Title  Pt to be independent with updated HEP     Time  8    Period  Weeks    Status  New    Target Date  12/18/17      OT LONG TERM GOAL #2   Title  Pt to demo shoulder flexion to 40 degrees or greater for consistent low level reaching    Time  8    Period  Weeks    Status  New      OT LONG TERM GOAL #3   Title  Pt to improve RUE functional use as evidenced by performing 10 blocks on Box & Blocks test    Time  8    Period  Weeks    Status  New      OT LONG TERM GOAL #4   Title  Pt to use Rt hand to write name at 75% legibility or greater with A/E prn    Time  8    Period  Weeks    Status  New      OT LONG TERM GOAL #5   Title  Pt to use Rt hand for  eating and grooming 75% of the time    Time  8    Period  Weeks    Status  New      Long Term Additional Goals   Additional Long Term Goals  Yes      OT LONG TERM GOAL #6   Title  Pt to return to simple cooking task with distant supervision and A/E prn    Time  8    Period  Weeks    Status  New            Plan - 11/25/17 1037    Clinical Impression Statement  Pt compensates RUE activating incorrect muscle groups for sh. flexion and elevates entire sh. girdle. Pt also with limited mobility in thoracic spine and ribcage. Pt also dominated by synergy pattern and spasticity    Occupational Profile and client history currently impacting functional performance  PMH: CAD, HTN, SAH, s/p CABG x 6, COPD    Occupational performance deficits (Please refer to evaluation for details):  ADL's;IADL's;Leisure;Social Participation    Rehab Potential  Good    OT Frequency  2x / week    OT Duration  8 weeks    OT Treatment/Interventions  Self-care/ADL training;Moist Heat;DME and/or AE instruction;Splinting;Contrast Bath;Compression bandaging;Therapeutic activities;Ultrasound;Therapeutic exercise;Cognitive remediation/compensation;Neuromuscular education;Functional Mobility Training;Passive range of motion;Visual/perceptual remediation/compensation;Electrical Stimulation;Paraffin;Manual Therapy;Patient/family education    Plan  NMR RUE/ trunk, ? light joint mobs for clavicle and ribcage, breathing techniques    Consulted and Agree with Plan of Care  Patient       Patient will benefit from skilled therapeutic intervention in order to improve the following deficits and impairments:  Decreased coordination, Decreased range of motion, Increased edema, Impaired sensation, Decreased knowledge of precautions, Impaired tone, Decreased activity tolerance, Decreased balance, Decreased knowledge of use of DME, Impaired UE functional use,  Pain, Decreased cognition, Decreased mobility, Decreased strength, Impaired  vision/preception  Visit Diagnosis: 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 Partial seizure (HCC) 11/08/2017  . Positive colorectal cancer screening using Cologuard test 10/22/2017  . Spastic hemiparesis (HCC)   . Cough   . 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 Pain   . Hyponatremia   . Benign essential HTN   . 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 Gait disturbance, post-stroke   . Aphasia as late effect of stroke   . SAH (subarachnoid hemorrhage) (HCC) 09/02/2017  . B12 deficiency 09/02/2017  . Well adult exam 06/16/2016  . Cigarette smoker 02/23/2016  . COPD GOLD II  12/24/2014  . CAD (coronary artery disease) 07/22/2011  . HTN (hypertension) 07/22/2011  . Dyslipidemia 07/22/2011  . Smoker 07/22/2011    Kelli ChurnBallie, Antavious Spanos Johnson, OTR/L 11/25/2017, 10:40 AM  [facility] [address] [place], [place], [address] Phone: [phone]   Fax:  [phone]  Name: Jaime Pierce MRN: [id number] Date of Birth: 1946/09/12

## 2017-11-25 NOTE — Therapy (Signed)
[facility] [address] [place], [place], [address] Phone: [phone]   Fax:  [phone]  Physical Therapy Treatment  Patient Details  Name: Jaime Pierce MRN: [id number] Date of Birth: [birth year] Referring Provider: Meredith Staggers, MD   Encounter Date: 11/25/2017  PT End of Session - 11/25/17 1210    Visit Number  14    Number of Visits  25    Date for PT Re-Evaluation  12/18/17    Authorization Type  Aetna Medicare     PT Start Time  1020    PT Stop Time  1105    PT Time Calculation (min)  45 min    Activity Tolerance  Patient tolerated treatment well    Behavior During Therapy  [facility] for tasks assessed/performed       Past Medical History:  Diagnosis Date  . Coronary artery disease   . Dyspnea on exertion   . Hemorrhagic stroke ([place])   . Hyperlipidemia   . Hypertension   . Tobacco abuse     Past Surgical History:  Procedure Laterality Date  . CORONARY ARTERY BYPASS GRAFT      There were no vitals filed for this visit.  Subjective Assessment - 11/25/17 1024    Subjective  No falls since previous session; had significant pain in R knee and ball of R foot after trial use of AFO and had a 24 hour stomach virus with diarrhea and dehydration so has not performed exercises recently.  Feeling better today.    Patient is accompained by:  Family member wife: Butch Penny    Pertinent History  CAD with CABG, DOE, hyperlipidemia, HTN and tobacco abuse    Patient Stated Goals  To be able to do the things he did before: mow with his tractor, get into and out of the boat for fishing!    Currently in Pain?  No/denies                      [facility] Adult PT Treatment/Exercise - 11/25/17 1204      Therapeutic Activites    Therapeutic Activities  Other Therapeutic Activities    Other Therapeutic Activities  Discussed and demonstrated to pt and wife use of foot up brace for DF assistance and attention to R  ankle DF during gait; did not trial with patient today but will assess gait with use of foot up brace tomorrow.      Neuro Re-ed    Neuro Re-ed Details   Following session with OT where OT noted significant rib flaring R thorax and increased scapular elevation and protraction with decreased shoulder flexion ROM and increased use of accessory breathing mm initiated training of diaphragmatic breathing and forced exhalation in hooklying position.  Focused on forced exhalation and core activation with rib depression with therapist providing manual facilitation on R side to lengthen pectoralis mm; during inhalation (following forced exhalation) pt demonstrated improved use of diaphragm depression and decreased use of neck accessory muscles.  Began to incorporate with breathing sequence: 8 reps shoulder depression and retraction, unilateral UE flexion to 90 deg, RLE heel slides and bilat LE bridging.  At end of session pt's posture demonstrated slight improvement in rib position and R shoulder positioning in sitting and during gait.               PT Education - 11/25/17 1210    Education provided  Yes    Education Details  breathing technique,  foot up brace    Person(s) Educated  Patient;Spouse    Methods  Explanation;Demonstration    Comprehension  Need further instruction       PT Short Term Goals - 11/17/17 2035      PT SHORT TERM GOAL #1   Title  Pt will participate in further assessment of balance with TUG and five time sit to stand with RW    Time  4    Period  Weeks    Status  Achieved      PT SHORT TERM GOAL #2   Title  Pt will perform sit <> stand and stand pivot transfers with LRAD and supervision with <10% cues for safety/sequencing    Baseline  MET 11/17/2017    Time  4    Period  Weeks    Status  Achieved      PT SHORT TERM GOAL #3   Title  Pt will ambulate x 250' on level, indoor surfaces (to/from waiting area) and will negotiate short ramp to simulate home entry/exit  with LRAD and min A     Baseline  MET 11/17/2017    Time  4    Period  Weeks    Status  Achieved      PT SHORT TERM GOAL #4   Title  Pt will improve gait velocity to > or = 1.0 ft/sec    Baseline  NOT MET 11/17/2017  Gait Velocity 0.61 ft/sec    Time  4    Period  Weeks    Status  Not Met      PT SHORT TERM GOAL #5   Title  Pt will initiate gait training with cane or lesser AD over level, indoor surfaces with therapy    Baseline  MET 11/17/2017    Time  4    Period  Weeks    Status  Achieved        PT Long Term Goals - 10/28/17 1256      PT LONG TERM GOAL #1   Title  Pt and wife will demonstrate independence with HEP    Time  8    Period  Weeks    Status  New    Target Date  12/18/16      PT LONG TERM GOAL #2   Title  Pt will decrease TUG time by  seconds with LRAD to </=47 sec. to decr. falls risk.    Baseline  62.62 sec. with RW and R hand orthosis on RW    Time  8    Period  Weeks    Status  New    Target Date  12/18/16      PT LONG TERM GOAL #3   Title  Pt will improve LE strength as indicated by decrease in five time sit to stand to <12 sec.    Baseline  17.27 seconds    Time  8    Period  Weeks    Status  New    Target Date  12/18/16      PT LONG TERM GOAL #4   Title  Pt will perform bed mobility and all transfers with LRAD MOD I    Time  8    Period  Weeks    Status  New    Target Date  12/18/16      PT LONG TERM GOAL #5   Title  Pt will ambulate >300 over indoor and paved outdoor surfaces and negotiate ramp with LRAD  and supervision for more independent household and community mobility    Time  8    Period  Weeks    Status  New    Target Date  12/18/16      PT LONG TERM GOAL #6   Title  Pt will improve gait velocity to > or = 1.21f/sec with LRAD    Time  8    Period  Weeks    Status  New    Target Date  12/18/16      PT LONG TERM GOAL #7   Title  Pt will improve overall function on FOTO by 25 points    Baseline  28%    Time  8    Period   Weeks    Status  New    Target Date  12/18/16            Plan - 11/25/17 1210    Clinical Impression Statement  Transitioned focus of session on breathing sequence and use of diaphragmatic breathing to improve posture, postural control, shoulder ROM and increased force production during LE strengthening exercises; will continue to address and provide pt with home breathing exercises at next session.  Initiated conversation about alternative DF assist orthosis that would not go under plantar surface of foot; will trial at next session.      Rehab Potential  Good    PT Frequency  3x / week    PT Duration  8 weeks    PT Treatment/Interventions  ADLs/Self Care Home Management;Electrical Stimulation;DME Instruction;Gait training;Stair training;Functional mobility training;Therapeutic activities;Therapeutic exercise;Balance training;Neuromuscular re-education;Cognitive remediation;Patient/family education;Orthotic Fit/Training;Passive range of motion;Energy conservation;Taping;Manual techniques    PT Next Visit Plan  NO ESTIM/BIONESS DUE TO RECENT SEIZURES PER DR. SYBOFBP trial foot up, breathing with incentive spirometer, Continue ramp training with RW, stair training, SBQC for short distances. Could try NMR in supported quadruped    PT Home Exercise Plan  pt goals include mowing with his tractor and fishing on his boat - incorporate into balance activities    Consulted and Agree with Plan of Care  Patient;Family member/caregiver    Family Member Consulted  wife       Patient will benefit from skilled therapeutic intervention in order to improve the following deficits and impairments:  Abnormal gait, Cardiopulmonary status limiting activity, Decreased balance, Decreased cognition, Decreased endurance, Decreased mobility, Decreased strength, Difficulty walking, Increased muscle spasms, Impaired sensation, Impaired tone, Impaired UE functional use, Postural dysfunction, Pain, Decreased range of  motion, Increased edema  Visit Diagnosis: 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 [place] ([place] 11/08/2017  . Positive colorectal cancer screening using Cologuard test 10/22/2017  . Spastic hemiparesis ([place]   . Cough   . Nontraumatic hemorrhage of left cerebral hemisphere ([place]   . Pain   . Hyponatremia   . Benign essential HTN   . 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 Gait disturbance, post-stroke   . Aphasia as late effect of stroke   . SAH (subarachnoid hemorrhage) ([place] 09/02/2017  . B12 deficiency 09/02/2017  . Well adult exam 06/16/2016  . Cigarette smoker 02/23/2016  . COPD GOLD II  12/24/2014  . CAD (coronary artery disease) 07/22/2011  . HTN (hypertension) 07/22/2011  .  Dyslipidemia 07/22/2011  . Smoker 07/22/2011    Rico Junker, PT, DPT 11/25/17    12:17 PM    [facility] [address] [place], [place], [address] Phone: [phone]   Fax:  [phone]  Name: Jaime Pierce MRN: [id number] Date of Birth: [birth year]

## 2017-11-25 NOTE — Therapy (Signed)
[facility] [address] [place], [place], [address] Phone: [phone]   Fax:  [phone]  Speech Language Pathology Treatment  Patient Details  Name: Jaime Pierce MRN: [id number] Date of Birth: 15-Nov-1946 Referring Provider: Faith Rogue, MD   Encounter Date: 11/25/2017  End of Session - 11/25/17 1245    Visit Number  9    Number of Visits  25    Date for SLP Re-Evaluation  12/31/17    SLP Start Time  1104 extended time to ambulate from PT appointment    SLP Stop Time   1149    SLP Time Calculation (min)  45 min    Activity Tolerance  Patient tolerated treatment well       Past Medical History:  Diagnosis Date  . Coronary artery disease   . Dyspnea on exertion   . Hemorrhagic stroke (HCC)   . Hyperlipidemia   . Hypertension   . Tobacco abuse     Past Surgical History:  Procedure Laterality Date  . CORONARY ARTERY BYPASS GRAFT      There were no vitals filed for this visit.  Subjective Assessment - 11/25/17 1108    Subjective  "I thought about doing it, does that count?" pt did not plan trip d/t having a stomach bug this week    Currently in Pain?  No/denies            ADULT SLP TREATMENT - 11/25/17 1104      General Information   Behavior/Cognition  Alert;Cooperative;Pleasant mood;Distractible    Patient Positioning  Upright in chair      Treatment Provided   Treatment provided  Cognitive-Linquistic      Pain Assessment   Pain Assessment  No/denies pain      Cognitive-Linquistic Treatment   Treatment focused on  Cognition    Skilled Treatment  Pt recalled 2/4 memory strategies independently today. Required min A for association and mental imaging. SLP facilitated use of mental imaging in word list rentention/mental manipulation task. Pt 85% accurate; in item with error pt admitted he did not visualize words. With written word problems, pt required extended time initially, however  with min A identified a strategy to assist with locating information in chart. Pt stated, "I've got to focus." SLP facilitated pt awareness of other deficit areas. Pt initially looked to his wife but with SLP cues stated "deducing" as another challenge, referring to deduction puzzle 1 given several weeks ago. Pt required mod A today in deduction puzzle 2, though SLP able to fade to min A questioning cues. Pt verbalizing difficulty with this task; wife states "This wouldn't have been so hard for him before the stroke."      Assessment / Recommendations / Plan   Plan  Continue with current plan of care      Progression Toward Goals   Progression toward goals  Progressing toward goals         SLP Short Term Goals - 11/25/17 1235      SLP SHORT TERM GOAL #1   Title  pt will demo emergent awareness 90% of the time in mod complex cognitive linguistic tasks    Time  1    Period  Weeks    Status  On-going      SLP SHORT TERM GOAL #2   Title  pt will recall memory strategies (WARM) with modified independence over three sessions    Baseline  11-10-17    Time  1  Period  Weeks    Status  On-going      SLP SHORT TERM GOAL #3   Title  pt will tell SLP three non-physical deficits over two sessions    Time  1    Period  Weeks      SLP SHORT TERM GOAL #4   Title  pt will complete min-mod complex reasoning/problem solving tasks involving money/time/etc with 95% success over three sessions    Time  1    Period  Weeks    Status  On-going       SLP Long Term Goals - 11/25/17 1247      SLP LONG TERM GOAL #1   Title  pt will use 1 memory strategy in 5 therapy sessions    Time  5    Period  Weeks    Status  On-going      SLP LONG TERM GOAL #2   Title  pt will demo [facility] executive function skills when presented with problems in everyday tasks over three sessions, with SBA    Time  5    Period  Weeks    Status  On-going      SLP LONG TERM GOAL #3   Title  pt will demo simple divided  attention with 90% success on both tasks over three sessions    Time  5    Period  Weeks    Status  On-going      SLP LONG TERM GOAL #4   Title  pt will use anticipatory awareness by double checking written cognitive linguistic tasks 100% of the time in 4 sessions    Time  5    Period  Weeks    Status  On-going       Plan - 11/25/17 1246    Clinical Impression Statement  Pt cont to exhibit cognitive deficits in attention, working memory with simple to mod complex auditory info, and emergent and anticipatory awareness. Working memory appears to be improving as seen in simple math word problems today. Pt problem solved strategies to compensate for attention with min A today. Pt will benefit from skilled ST to address these deficits in order to return pt to PLOF.    Speech Therapy Frequency  3x / week    Treatment/Interventions  SLP instruction and feedback;Compensatory strategies;Patient/family education;Internal/external aids;Cognitive reorganization;Cueing hierarchy;Environmental controls;Language facilitation;Functional tasks    Potential to Achieve Goals  Good    Consulted and Agree with Plan of Care  Patient       Patient will benefit from skilled therapeutic intervention in order to improve the following deficits and impairments:   Cognitive communication deficit    Problem List Patient Active Problem List   Diagnosis Date Noted  . Partial seizure (HCC) 11/08/2017  . Positive colorectal cancer screening using Cologuard test 10/22/2017  . Spastic hemiparesis (HCC)   . Cough   . 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 Pain   . Hyponatremia   . Benign essential HTN   . 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 Gait disturbance, post-stroke   . Aphasia as late effect of stroke   . SAH (subarachnoid hemorrhage) (HCC) 09/02/2017  . B12 deficiency 09/02/2017  . Well adult exam 06/16/2016  .  Cigarette smoker 02/23/2016  . COPD GOLD II  12/24/2014  . CAD (coronary artery disease) 07/22/2011  . HTN (hypertension) 07/22/2011  . Dyslipidemia 07/22/2011  . Smoker  07/22/2011   [name], MS, CCC-SLP Speech-Language Pathologist  [name] 11/25/2017, 12:48 PM  [facility] [address] [place], [place], [address] Phone: [phone]   Fax:  [phone]   Name: Jaime Pierce MRN: [id number] Date of Birth: Aug 21, 1946

## 2017-11-26 ENCOUNTER — Telehealth: Payer: Self-pay | Admitting: Neurology

## 2017-11-26 ENCOUNTER — Other Ambulatory Visit: Payer: Self-pay

## 2017-11-26 ENCOUNTER — Ambulatory Visit: Payer: Medicare HMO

## 2017-11-26 ENCOUNTER — Ambulatory Visit: Payer: Medicare HMO | Admitting: Physical Therapy

## 2017-11-26 ENCOUNTER — Ambulatory Visit: Payer: Medicare HMO | Admitting: Occupational Therapy

## 2017-11-26 DIAGNOSIS — R2689 Other abnormalities of gait and mobility: Secondary | ICD-10-CM

## 2017-11-26 DIAGNOSIS — R41841 Cognitive communication deficit: Secondary | ICD-10-CM

## 2017-11-26 DIAGNOSIS — M25511 Pain in right shoulder: Secondary | ICD-10-CM

## 2017-11-26 DIAGNOSIS — R278 Other lack of coordination: Secondary | ICD-10-CM

## 2017-11-26 DIAGNOSIS — R208 Other disturbances of skin sensation: Secondary | ICD-10-CM

## 2017-11-26 DIAGNOSIS — I69251 Hemiplegia and hemiparesis following other nontraumatic intracranial hemorrhage affecting right dominant side: Secondary | ICD-10-CM | POA: Diagnosis not present

## 2017-11-26 DIAGNOSIS — M6281 Muscle weakness (generalized): Secondary | ICD-10-CM

## 2017-11-26 NOTE — Therapy (Signed)
[facility] [address] [place], [place], [address] Phone: [phone]   Fax:  [phone]  Occupational Therapy Treatment  Patient Details  Name: Jaime Pierce MRN: [id number] Date of Birth: Apr 17, 1946 Referring Provider (Historical): Dr. Dow Adolph   Encounter Date: 11/26/2017  OT End of Session - 11/26/17 1109    Visit Number  14    Number of Visits  24    Date for OT Re-Evaluation  12/18/17    Authorization Type  Aetna MCR - G code required    Authorization - Visit Number  14    Authorization - Number of Visits  20    OT Start Time  1107    OT Stop Time  1145    OT Time Calculation (min)  38 min       Past Medical History:  Diagnosis Date  . Coronary artery disease   . Dyspnea on exertion   . Hemorrhagic stroke (HCC)   . Hyperlipidemia   . Hypertension   . Tobacco abuse     Past Surgical History:  Procedure Laterality Date  . CORONARY ARTERY BYPASS GRAFT      There were no vitals filed for this visit.  Subjective Assessment - 11/26/17 1109    Pertinent History  ICH Lt parietal lobe 09/01/17. PMH: CAD, SAH, COPD, s/p CABG x 6    Limitations  Recent partial seizure 10/23/17 - no estim unless specific MD clearance, no driving for 6 months    Patient Stated Goals  be independent again    Currently in Pain?  No/denies             Treatment:Supine right shoulder joint mobs, sidelying scapular mobilization. Prone for bilateral scapular retraction, mod facilitation.  Sidelying while weightbearing through right elbow, mod/ max facilitation, while perform body on arm movements for right lateral trunk flexion, mod / max facilitation. Seated weightbearing through RUE edge of mat, followed by low range shoulder flexion with UE ranger, min facilitation/ mod v.c                OT Short Term Goals - 11/19/17 1257      OT SHORT TERM GOAL #1   Title  Independent with initial HEP     Status  On-going      OT SHORT TERM GOAL #2   Title  Pt/wife to verbalize understanding with edema management (including compression glove) and safety considerations w/ lack of sensation Rt hand    Status  Achieved      OT SHORT TERM GOAL #3   Title  Pt to demo 25 degrees shoulder flexion in prep for low level reaching RUE    Status  Achieved      OT SHORT TERM GOAL #4   Title  Pt to consistently be mod I level for dressing and bathing with A/E and task modifications prn    Status  On-going      OT SHORT TERM GOAL #5   Title  Pt to use Rt hand to feed self finger foods 50% of the time    Status  On-going      OT SHORT TERM GOAL #6   Title  Pt to perform shower transfer with CGA only using DME prn    Status  On-going        OT Long Term Goals - 10/19/17 1055      OT LONG TERM GOAL #1   Title  Pt to be  independent with updated HEP     Time  8    Period  Weeks    Status  New    Target Date  12/18/17      OT LONG TERM GOAL #2   Title  Pt to demo shoulder flexion to 40 degrees or greater for consistent low level reaching    Time  8    Period  Weeks    Status  New      OT LONG TERM GOAL #3   Title  Pt to improve RUE functional use as evidenced by performing 10 blocks on Box & Blocks test    Time  8    Period  Weeks    Status  New      OT LONG TERM GOAL #4   Title  Pt to use Rt hand to write name at 75% legibility or greater with A/E prn    Time  8    Period  Weeks    Status  New      OT LONG TERM GOAL #5   Title  Pt to use Rt hand for eating and grooming 75% of the time    Time  8    Period  Weeks    Status  New      Long Term Additional Goals   Additional Long Term Goals  Yes      OT LONG TERM GOAL #6   Title  Pt to return to simple cooking task with distant supervision and A/E prn    Time  8    Period  Weeks    Status  New            Plan - 11/26/17 1602    Clinical Impression Statement  Pt continues to remain limited by spasticity in RUE and  right shoulder/ scapular malpositioning    Rehab Potential  Good    OT Frequency  2x / week    OT Duration  8 weeks    OT Treatment/Interventions  Self-care/ADL training;Moist Heat;DME and/or AE instruction;Splinting;Contrast Bath;Compression bandaging;Therapeutic activities;Ultrasound;Therapeutic exercise;Cognitive remediation/compensation;Neuromuscular education;Functional Mobility Training;Passive range of motion;Visual/perceptual remediation/compensation;Electrical Stimulation;Paraffin;Manual Therapy;Patient/family education    Plan  NMR RUE/ trunk,    Consulted and Agree with Plan of Care  Patient    Family Member Consulted  wife       Patient will benefit from skilled therapeutic intervention in order to improve the following deficits and impairments:  Decreased coordination, Decreased range of motion, Increased edema, Impaired sensation, Decreased knowledge of precautions, Impaired tone, Decreased activity tolerance, Decreased balance, Decreased knowledge of use of DME, Impaired UE functional use, Pain, Decreased cognition, Decreased mobility, Decreased strength, Impaired vision/preception  Visit Diagnosis: 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 Partial seizure (HCC) 11/08/2017  . Positive colorectal cancer screening using Cologuard test 10/22/2017  . Spastic hemiparesis (HCC)   . Cough   . 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 Pain   . Hyponatremia   . Benign essential HTN   . 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 Gait disturbance, post-stroke   . Aphasia as late effect of stroke   . SAH (subarachnoid  hemorrhage) (HCC)  09/02/2017  . B12 deficiency 09/02/2017  . Well adult exam 06/16/2016  . Cigarette smoker 02/23/2016  . COPD GOLD II  12/24/2014  . CAD (coronary artery disease) 07/22/2011  . HTN (hypertension) 07/22/2011  . Dyslipidemia 07/22/2011  . Smoker 07/22/2011    Mahala Rommel 11/26/2017, 4:05 PM  [facility] [address] [place], [place], [address] Phone: [phone]   Fax:  [phone]  Name: Delora FuelRichard A Seman MRN: [id number] Date of Birth: 04-30-1946

## 2017-11-26 NOTE — Therapy (Signed)
Jaime Pierce [address] [place], [place], [address] Phone: [phone]   Fax:  [phone]  Speech Language Pathology Treatment  Patient Details  Name: Jaime Pierce MRN: [id number] Date of Birth: 1946-10-01 Referring Provider: Alger Simons, MD   Encounter Date: 11/26/2017  End of Session - 11/26/17 1713    Visit Number  10    Number of Visits  25    Date for SLP Re-Evaluation  12/31/17    SLP Start Time  1021    SLP Stop Time   1101    SLP Time Calculation (min)  40 min    Activity Tolerance  Patient tolerated treatment well       Past Medical History:  Diagnosis Date  . Coronary artery disease   . Dyspnea on exertion   . Hemorrhagic stroke ([place])   . Hyperlipidemia   . Hypertension   . Tobacco abuse     Past Surgical History:  Procedure Laterality Date  . CORONARY ARTERY BYPASS GRAFT      There were no vitals filed for this visit.  Subjective Assessment - 11/26/17 1029    Subjective  Pt brought homework back to SLP completed.    Patient is accompained by:  Family member wife    Currently in Pain?  No/denies            ADULT SLP TREATMENT - 11/26/17 1041      General Information   Behavior/Cognition  Alert;Cooperative;Pleasant mood;Distractible      Treatment Provided   Treatment provided  Cognitive-Linquistic      Cognitive-Linquistic Treatment   Treatment focused on  Cognition    Skilled Treatment  In homework, pt was adamant about not using calculator with incorrect answer on homework however after attempting to write longhand, pt did use calculator and req'd repeats and restarts to calculate correct answer. Pt with decr'd problem soving skills to figure out how to correct his errors. He three times attempted a simple division problem in his head incorrectly, not seeing his error if SLP hadn't pointed it out. SLP reviewed memory strategies with pt, 3/4 recalled correctly. SLP used  mental image for "mental image" strategy, and pt recalled "mental image" [place] the mental image SLP used with pt. Prior to that pt did not think to use a strategy (decr'd problem solving)/      Assessment / Recommendations / Plan   Plan  Continue with current plan of care      Progression Toward Goals   Progression toward goals  Progressing toward goals       SLP Education - 11/26/17 1712    Education provided  Yes    Education Details  memory strategies    Person(s) Educated  Patient;Spouse    Methods  Explanation;Demonstration;Verbal cues    Comprehension  Verbalized understanding;Verbal cues required       SLP Short Term Goals - 11/26/17 1714      SLP SHORT TERM GOAL #1   Title  pt will demo emergent awareness 90% of the time in mod complex cognitive linguistic tasks    Status  Not Met      SLP SHORT TERM GOAL #2   Title  pt will recall memory strategies (WARM) with modified independence over three sessions    Status  Not Met      SLP SHORT TERM GOAL #3   Title  pt will tell SLP three non-physical deficits over two sessions  Status  Partially Met      SLP SHORT TERM GOAL #4   Title  pt will complete min-mod complex reasoning/problem solving tasks involving money/time/etc with 95% success over three sessions    Status  Not Met       SLP Long Term Goals - Dec 11, 2017 1714      SLP LONG TERM GOAL #1   Title  pt will use 1 memory strategy in 3 therapy sessions    Time  5    Period  Weeks    Status  Revised      SLP LONG TERM GOAL #2   Title  pt will demo [facility] executive function skills when presented with simple problems in everyday tasks over three sessions, with occasional min A    Time  5    Period  Weeks    Status  Revised      SLP LONG TERM GOAL #3   Title  pt will demo simple alternating attention with 85% success on both tasks over three sessions    Time  5    Period  Weeks    Status  Revised      SLP LONG TERM GOAL #4   Title  pt will  use anticipatory awareness by double checking written cognitive linguistic tasks with usual nonverbal cues in 3 sessions    Time  5    Period  Weeks    Status  Revised       Plan - 2017/12/11 1713    Clinical Impression Statement  Pt cont to exhibit cognitive deficits in attention, working memory with simple to mod complex auditory info, and emergent and anticipatory awareness. See "skilled intervention" for more details. Pt will benefit from skilled ST to address these deficits in order to return pt to PLOF.    Speech Therapy Frequency  3x / week    Treatment/Interventions  SLP instruction and feedback;Compensatory strategies;Patient/family education;Internal/external aids;Cognitive reorganization;Cueing hierarchy;Environmental controls;Language facilitation;Functional tasks    Potential to Achieve Goals  Good    Consulted and Agree with Plan of Care  Patient       Patient will benefit from skilled therapeutic intervention in order to improve the following deficits and impairments:   Cognitive communication deficit  G-Codes - 12/11/2017 1717    Functional Assessment Tool Used  NOMS ,clincal judgment    Functional Limitations  Other Speech Language Pathology problem solving/reasoning    Other Speech-Language Pathology Functional Limitation Current Status [phone])  At least 20 percent but less than 40 percent impaired, limited or restricted    Other Speech-Language Pathology Functional Limitation Goal Status (O7096)  At least 20 percent but less than 40 percent impaired, limited or restricted       Speech Therapy Progress Note  Dates of Reporting Period: 10-28-17 to 12-11-2017  Objective Reports of Subjective Statement: Pt has been seen for 10 ST visits focusing on cognitive-linguistics.  Objective: See note above.  Goal Update: See goals above  Plan: Continue to see pt. Pt acceptance of deficits and awareness must improve for progress.   Reason Skilled Services are Required: Pt has made  small gains in selective attention and in intellectual awareness only.    Problem List Patient Active Problem List   Diagnosis Date Noted  . Partial seizure ([place]) 11/08/2017  . Positive colorectal cancer screening using Cologuard test 10/22/2017  . Spastic hemiparesis ([place])   . Cough   . 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 Pain   .  Hyponatremia   . Benign essential HTN   . 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 Gait disturbance, post-stroke   . Aphasia as late effect of stroke   . SAH (subarachnoid hemorrhage) ([place]) 09/02/2017  . B12 deficiency 09/02/2017  . Well adult exam 06/16/2016  . Cigarette smoker 02/23/2016  . COPD GOLD II  12/24/2014  . CAD (coronary artery disease) 07/22/2011  . HTN (hypertension) 07/22/2011  . Dyslipidemia 07/22/2011  . Smoker 07/22/2011    [facility], [place]  11/26/2017, 5:18 PM  [facility] [address] [place], [place], [address] Phone: [phone]   Fax:  [phone]   Name: Jaime Pierce MRN: [id number] Date of Birth: Dec 29, 1945

## 2017-11-26 NOTE — Therapy (Signed)
[facility] [address] [place], [place], [address] Phone: [phone]   Fax:  [phone]  Physical Therapy Treatment  Patient Details  Name: Jaime Pierce MRN: [id number] Date of Birth: 1946/05/04 Referring Provider: Meredith Staggers, MD   Encounter Date: 11/26/2017  PT End of Session - 11/26/17 1334    Visit Number  15    Number of Visits  25    Date for PT Re-Evaluation  12/18/17    Authorization Type  Aetna Medicare     PT Start Time  0930    PT Stop Time  1018    PT Time Calculation (min)  48 min    Activity Tolerance  Patient tolerated treatment well    Behavior During Therapy  [facility] for tasks assessed/performed       Past Medical History:  Diagnosis Date  . Coronary artery disease   . Dyspnea on exertion   . Hemorrhagic stroke ([place])   . Hyperlipidemia   . Hypertension   . Tobacco abuse     Past Surgical History:  Procedure Laterality Date  . CORONARY ARTERY BYPASS GRAFT      There were no vitals filed for this visit.  Subjective Assessment - 11/26/17 0938    Subjective  Had EEG yesterday after therapy sessions.  Since coming off the [place] pt has noted increased shaking in hand and RLE.  Continues to have panic attack when ambulating past stairs to basement.  Pt is holding on to RW with RUE more consistently.    Patient is accompained by:  Family member wife: Butch Penny    Pertinent History  CAD with CABG, DOE, hyperlipidemia, HTN and tobacco abuse    Patient Stated Goals  To be able to do the things he did before: mow with his tractor, get into and out of the boat for fishing!    Currently in Pain?  No/denies                      [facility] Adult PT Treatment/Exercise - 11/26/17 0955      Ambulation/Gait   Ambulation/Gait  Yes    Ambulation/Gait Assistance  5: Supervision    Ambulation/Gait Assistance Details  gait assessment with foot up brace on level, indoor surfaces with  improved foot clearance, improved step length and heel strike at initial contact     Ambulation Distance (Feet)  75 Feet    Assistive device  Rolling walker    Ambulation Surface  Level;Indoor    Ramp  4: Min assist    Ramp Details (indicate cue type and reason)  with RW and foot up brace       Self-Care   Self-Care  Other Self-Care Comments    Other Self-Care Comments   proper breathing sequence with incentive spirometer 10-12 reps with therapist providing manual facilitation on R chest/shoulder for increased expansion and shoulder ER during inhalation             PT Education - 11/26/17 1334    Education provided  Yes    Education Details  breathing exercises with incentive spirometer, foot up brace during gait    Person(s) Educated  Patient;Spouse    Methods  Explanation;Demonstration    Comprehension  Verbalized understanding;Returned demonstration       PT Short Term Goals - 11/17/17 2035      PT SHORT TERM GOAL #1   Title  Pt will participate in further assessment of balance  with TUG and five time sit to stand with RW    Time  4    Period  Weeks    Status  Achieved      PT SHORT TERM GOAL #2   Title  Pt will perform sit <> stand and stand pivot transfers with LRAD and supervision with <10% cues for safety/sequencing    Baseline  MET 11/17/2017    Time  4    Period  Weeks    Status  Achieved      PT SHORT TERM GOAL #3   Title  Pt will ambulate x 250' on level, indoor surfaces (to/from waiting area) and will negotiate short ramp to simulate home entry/exit with LRAD and min A     Baseline  MET 11/17/2017    Time  4    Period  Weeks    Status  Achieved      PT SHORT TERM GOAL #4   Title  Pt will improve gait velocity to > or = 1.0 ft/sec    Baseline  NOT MET 11/17/2017  Gait Velocity 0.61 ft/sec    Time  4    Period  Weeks    Status  Not Met      PT SHORT TERM GOAL #5   Title  Pt will initiate gait training with cane or lesser AD over level, indoor  surfaces with therapy    Baseline  MET 11/17/2017    Time  4    Period  Weeks    Status  Achieved        PT Long Term Goals - 10/28/17 1256      PT LONG TERM GOAL #1   Title  Pt and wife will demonstrate independence with HEP    Time  8    Period  Weeks    Status  New    Target Date  12/18/16      PT LONG TERM GOAL #2   Title  Pt will decrease TUG time by  seconds with LRAD to </=47 sec. to decr. falls risk.    Baseline  62.62 sec. with RW and R hand orthosis on RW    Time  8    Period  Weeks    Status  New    Target Date  12/18/16      PT LONG TERM GOAL #3   Title  Pt will improve LE strength as indicated by decrease in five time sit to stand to <12 sec.    Baseline  17.27 seconds    Time  8    Period  Weeks    Status  New    Target Date  12/18/16      PT LONG TERM GOAL #4   Title  Pt will perform bed mobility and all transfers with LRAD MOD I    Time  8    Period  Weeks    Status  New    Target Date  12/18/16      PT LONG TERM GOAL #5   Title  Pt will ambulate >300 over indoor and paved outdoor surfaces and negotiate ramp with LRAD and supervision for more independent household and community mobility    Time  8    Period  Weeks    Status  New    Target Date  12/18/16      PT LONG TERM GOAL #6   Title  Pt will improve gait velocity to > or = 1.13f/sec with  LRAD    Time  8    Period  Weeks    Status  New    Target Date  12/18/16      PT LONG TERM GOAL #7   Title  Pt will improve overall function on FOTO by 25 points    Baseline  28%    Time  8    Period  Weeks    Status  New    Target Date  12/18/16            Plan - 11/26/17 1334    Clinical Impression Statement  Continued gait assessment with foot up brace over level surfaces and up/down ramp; pt encouraged to wear foot up brace through clinic as he ambulated to/from SLP and OT sessions to determine effectiveness and tolerance.  Pt reports feeling a big difference at end of OT session when Foot  UP removed, no pain reported; wife stating they will purchase one ASAP.  Continued to perform breathing training with incentive spirometer with pt in sitting; pt advised to perform at home with RUE supported, 10-12 repetitions.  Will continue to address and progress towards LTG.    Rehab Potential  Good    PT Frequency  3x / week    PT Duration  8 weeks    PT Treatment/Interventions  ADLs/Self Care Home Management;Electrical Stimulation;DME Instruction;Gait training;Stair training;Functional mobility training;Therapeutic activities;Therapeutic exercise;Balance training;Neuromuscular re-education;Cognitive remediation;Patient/family education;Orthotic Fit/Training;Passive range of motion;Energy conservation;Taping;Manual techniques    PT Next Visit Plan  NO ESTIM/BIONESS DUE TO RECENT SEIZURES PER DR. XIPJAS; Continue ramp training with RW, stair training, and SBQC for short distances with foot up.  Breathing with postural exercises. Could try NMR in supported quadruped    PT Home Exercise Plan  pt goals include mowing with his tractor and fishing on his boat - incorporate into balance activities    Consulted and Agree with Plan of Care  Patient;Family member/caregiver    Family Member Consulted  wife       Patient will benefit from skilled therapeutic intervention in order to improve the following deficits and impairments:  Abnormal gait, Cardiopulmonary status limiting activity, Decreased balance, Decreased cognition, Decreased endurance, Decreased mobility, Decreased strength, Difficulty walking, Increased muscle spasms, Impaired sensation, Impaired tone, Impaired UE functional use, Postural dysfunction, Pain, Decreased range of motion, Increased edema  Visit Diagnosis: 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)  Other disturbances of skin sensation  Muscle weakness (generalized)  Other abnormalities of gait and  mobility     Problem List Patient Active Problem List   Diagnosis Date Noted  . Partial seizure ([place]) 11/08/2017  . Positive colorectal cancer screening using Cologuard test 10/22/2017  . Spastic hemiparesis ([place])   . Cough   . 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 Pain   . Hyponatremia   . Benign essential HTN   . 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 Gait disturbance, post-stroke   . Aphasia as late effect of stroke   . SAH (subarachnoid hemorrhage) ([place]) 09/02/2017  . B12 deficiency 09/02/2017  . Well adult exam 06/16/2016  . Cigarette smoker 02/23/2016  . COPD GOLD II  12/24/2014  . CAD (coronary artery disease) 07/22/2011  . HTN (hypertension) 07/22/2011  . Dyslipidemia 07/22/2011  . Smoker 07/22/2011    Rico Junker, PT, DPT 11/26/17    1:42 PM    [facility] [address] [place], [place], [address] Phone: [phone]   Fax:  [phone]  Name: Jaime Pierce MRN: [id number] Date of Birth: [birth year]

## 2017-11-26 NOTE — Telephone Encounter (Signed)
[name], [name], called re: pt had a 5 min episode of numbness and maybe weakness (could not hold glass of Water) this afternoon, affecting his "good side", not the side that was affected by his stroke.  I explained, that there are several possible etiologies, but impossible to r/o TIA. For reassurance, may be best to get checked out in ER for concern for TIA/stroke.  DDx: can be pinched nerve from the neck, CTS, exhaustion from having his therapies today and running errands with wife, etc.

## 2017-11-26 NOTE — Patient Instructions (Signed)
  Please complete the assigned speech therapy homework prior to your next session and return it to the speech therapist at your next visit.  

## 2017-11-26 NOTE — Procedures (Signed)
   HISTORY: 72 year old had left parietal intracerebral hemorrhage in Oct 2018.    TECHNIQUE:  16 channel EEG was performed based on standard 10-16 international system. One channel was dedicated to EKG, which has demonstrates normal sinus rhythm of 66 beats per minutes.  Upon awakening, the posterior background activity was asymmetric, right side is well developed with rhythmic 7-8 Hz activities.  Left side is slower, dysarrythmic mainly involving left C3, P3, O3 leads.  There are frequent electrode artifact through out the study  There was no evidence of epileptiform discharge.  Photic stimulation was performed, which induced a symmetric photic driving.  Hyperventilation was not performed.  No sleep was achieved.  CONCLUSION: This is an abnormal and yet technically limited EEG.  There is electrodiagnostic evidence of slowing involving left cental parietal region, consistent with his history of left parietal parenchymal hemorrhage.  Jaime FeinsteinYijun Vishal Pierce, M.D. Ph.D.  [facility] [address] [place], [place] [address] Phone: [phone] Fax:      [phone]

## 2017-11-27 ENCOUNTER — Other Ambulatory Visit: Payer: Self-pay

## 2017-11-27 ENCOUNTER — Encounter (HOSPITAL_COMMUNITY): Payer: Self-pay | Admitting: *Deleted

## 2017-11-27 ENCOUNTER — Emergency Department (HOSPITAL_COMMUNITY): Payer: Medicare HMO

## 2017-11-27 ENCOUNTER — Telehealth: Payer: Self-pay | Admitting: Neurology

## 2017-11-27 ENCOUNTER — Emergency Department (HOSPITAL_COMMUNITY)
Admission: EM | Admit: 2017-11-27 | Discharge: 2017-11-27 | Disposition: A | Discharge status: Home / Self Care | Payer: Medicare HMO | Attending: Emergency Medicine | Admitting: Emergency Medicine

## 2017-11-27 DIAGNOSIS — R200 Anesthesia of skin: Secondary | ICD-10-CM | POA: Diagnosis not present

## 2017-11-27 DIAGNOSIS — I2510 Atherosclerotic heart disease of native coronary artery without angina pectoris: Secondary | ICD-10-CM | POA: Diagnosis not present

## 2017-11-27 DIAGNOSIS — G459 Transient cerebral ischemic attack, unspecified: Secondary | ICD-10-CM | POA: Diagnosis not present

## 2017-11-27 DIAGNOSIS — Z87891 Personal history of nicotine dependence: Secondary | ICD-10-CM | POA: Insufficient documentation

## 2017-11-27 DIAGNOSIS — Z951 Presence of aortocoronary bypass graft: Secondary | ICD-10-CM | POA: Insufficient documentation

## 2017-11-27 DIAGNOSIS — I10 Essential (primary) hypertension: Secondary | ICD-10-CM | POA: Diagnosis not present

## 2017-11-27 DIAGNOSIS — Z79899 Other long term (current) drug therapy: Secondary | ICD-10-CM | POA: Insufficient documentation

## 2017-11-27 DIAGNOSIS — I639 Cerebral infarction, unspecified: Secondary | ICD-10-CM | POA: Diagnosis not present

## 2017-11-27 HISTORY — DX: Cerebral infarction, unspecified: I63.9

## 2017-11-27 HISTORY — DX: Unspecified convulsions: R56.9

## 2017-11-27 LAB — I-STAT CHEM 8, ED
BUN: 17 mg/dL (ref 6–20)
CALCIUM ION: 1.1 mmol/L — AB (ref 1.15–1.40)
CHLORIDE: 103 mmol/L (ref 101–111)
Creatinine, Ser: 0.6 mg/dL — ABNORMAL LOW (ref 0.61–1.24)
Glucose, Bld: 92 mg/dL (ref 65–99)
HEMATOCRIT: 42 % (ref 39.0–52.0)
Hemoglobin: 14.3 g/dL (ref 13.0–17.0)
Potassium: 4.2 mmol/L (ref 3.5–5.1)
SODIUM: 139 mmol/L (ref 135–145)
TCO2: 27 mmol/L (ref 22–32)

## 2017-11-27 LAB — CBC
HCT: 41.5 % (ref 39.0–52.0)
Hemoglobin: 13.9 g/dL (ref 13.0–17.0)
MCH: 30.5 pg (ref 26.0–34.0)
MCHC: 33.5 g/dL (ref 30.0–36.0)
MCV: 91.2 fL (ref 78.0–100.0)
PLATELETS: 279 10*3/uL (ref 150–400)
RBC: 4.55 MIL/uL (ref 4.22–5.81)
RDW: 14.9 % (ref 11.5–15.5)
WBC: 8.6 10*3/uL (ref 4.0–10.5)

## 2017-11-27 LAB — PROTIME-INR
INR: 1.02
PROTHROMBIN TIME: 13.3 s (ref 11.4–15.2)

## 2017-11-27 LAB — DIFFERENTIAL
BASOS ABS: 0.1 10*3/uL (ref 0.0–0.1)
Basophils Relative: 1 %
Eosinophils Absolute: 0.2 10*3/uL (ref 0.0–0.7)
Eosinophils Relative: 2 %
LYMPHS ABS: 3.4 10*3/uL (ref 0.7–4.0)
Lymphocytes Relative: 39 %
MONO ABS: 1 10*3/uL (ref 0.1–1.0)
Monocytes Relative: 12 %
NEUTROS ABS: 3.9 10*3/uL (ref 1.7–7.7)
Neutrophils Relative %: 46 %

## 2017-11-27 LAB — COMPREHENSIVE METABOLIC PANEL
ALBUMIN: 3.7 g/dL (ref 3.5–5.0)
ALT: 19 U/L (ref 17–63)
ANION GAP: 7 (ref 5–15)
AST: 21 U/L (ref 15–41)
Alkaline Phosphatase: 46 U/L (ref 38–126)
BUN: 13 mg/dL (ref 6–20)
CALCIUM: 9.1 mg/dL (ref 8.9–10.3)
CO2: 25 mmol/L (ref 22–32)
Chloride: 105 mmol/L (ref 101–111)
Creatinine, Ser: 0.68 mg/dL (ref 0.61–1.24)
GFR calc non Af Amer: >60 mL/min (ref >60)
GLUCOSE: 92 mg/dL (ref 65–99)
POTASSIUM: 4.1 mmol/L (ref 3.5–5.1)
SODIUM: 137 mmol/L (ref 135–145)
TOTAL PROTEIN: 6.7 g/dL (ref 6.5–8.1)
Total Bilirubin: 0.7 mg/dL (ref 0.3–1.2)

## 2017-11-27 LAB — I-STAT TROPONIN, ED: Troponin i, poc: 0 ng/mL (ref 0.00–0.08)

## 2017-11-27 LAB — APTT: APTT: 31 s (ref 24–36)

## 2017-11-27 NOTE — ED Notes (Signed)
Pt c/o L sided arm tingling onset today at 16:00, lasting 10 mins, pt reports symptoms have resolved at this time, VAN negative at nurse first, hx of CVA, pt reports symptoms were present yesterday as well lasting 5 mins

## 2017-11-27 NOTE — ED Triage Notes (Signed)
Pt had hx of stroke in Oct, has residual right side weakness. Yesterday had episode of numbness to left hand that lasted approx 5 mins. Today had numbness to left hand, arm, left ear and jaw are that lasted approx 10 mins and resolved pta. Denies any weakness or numbness to lower extremities.

## 2017-11-27 NOTE — ED Provider Notes (Addendum)
[facility] EMERGENCY DEPARTMENT Provider Note   CSN: [id number] Arrival date & time: 11/27/17  1743     History   Chief Complaint Chief Complaint  Patient presents with  . Numbness    HPI Jaime Pierce is a 72 y.o. male.  Is here for evaluation of brief episodes of numbness, he had one episode yesterday into today.  All 3 episodes involve the left hand, and one earlier today involve the left forearm.  While being put into the examination room this evening, he had very brief numbness in the left hand as well.  Also, the episode earlier today affected the left and left forearm left upper arm left shoulder and left jaw.  All of the episodes resolved in less than 10 minutes.  There is been no headache, neck pain, nausea, vomiting, fever, chills, change in bowel or urinary habits.  Continues to be able to walk using his walker.  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  He subsequently had seizures, and was placed on Keppra.  Last week he had both EEG and MRI, results are not yet known by family members.  There are no other known modifying factors.  HPI  Past Medical History:  Diagnosis Date  . Coronary artery disease   . Dyspnea on exertion   . Hemorrhagic stroke (HCC)   . Hyperlipidemia   . Hypertension   . Seizures (HCC)   . Stroke (HCC)   . Tobacco abuse     Patient Active Problem List   Diagnosis Date Noted  . Partial seizure (HCC) 11/08/2017  . Positive colorectal cancer screening using Cologuard test 10/22/2017  . Spastic hemiparesis (HCC)   . Cough   . 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 Pain   . Hyponatremia   . Benign essential HTN   . 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 Gait disturbance, post-stroke   . Aphasia as late effect of stroke   . SAH (subarachnoid hemorrhage) (HCC) 09/02/2017  .  B12 deficiency 09/02/2017  . Well adult exam 06/16/2016  . Cigarette smoker 02/23/2016  . COPD GOLD II  12/24/2014  . CAD (coronary artery disease) 07/22/2011  . HTN (hypertension) 07/22/2011  . Dyslipidemia 07/22/2011  . Smoker 07/22/2011    Past Surgical History:  Procedure Laterality Date  . CORONARY ARTERY BYPASS GRAFT         Home Medications    Prior to Admission medications   Medication Sig Start Date End Date Taking? Authorizing Provider  albuterol (PROAIR HFA) 108 (90 Base) MCG/ACT inhaler 2 puffs up to every 4 hours if can't catch your breath Patient taking differently: Inhale 1-2 puffs into the lungs every 4 (four) hours as needed for wheezing or shortness of breath.  03/25/17   Nyoka Cowden, MD  b complex vitamins tablet Take 1 tablet by mouth daily. 10/22/17   Plotnikov, Georgina Quint, MD  baclofen (LIORESAL) 20 MG tablet Take 1 tablet (20 mg total) by mouth 3 (three) times daily. 11/08/17   Ranelle Oyster, MD  budesonide-formoterol [facility]) 160-4.5 MCG/ACT inhaler Take 2 puffs first thing in am and then another 2 puffs about 12 hours later. Patient taking differently: Inhale 2 puffs into the lungs 2 (two) times daily.  03/25/17   Nyoka Cowden, MD  Cholecalciferol (VITAMIN D3) 2000 units capsule Take 1 capsule (2,000 Units  total) by mouth daily. 10/22/17   Plotnikov, Georgina Quint, MD  diclofenac sodium (VOLTAREN) 1 % GEL Apply 2 g topically 4 (four) times daily as needed (Pain). 11/08/17   Ranelle Oyster, MD  divalproex (DEPAKOTE) 500 MG DR tablet Take 1 tablet (500 mg total) by mouth daily. 11/10/17   Micki Riley, MD  divalproex (DEPAKOTE) 500 MG DR tablet Take 1 tablet (500 mg total) by mouth daily. 11/10/17   Micki Riley, MD  irbesartan (AVAPRO) 75 MG tablet Take 1 tablet (75 mg total) by mouth daily. 10/22/17   Plotnikov, Georgina Quint, MD  metoprolol tartrate (LOPRESSOR) 25 MG tablet Take 0.5 tablets (12.5 mg total) by mouth 2 (two) times daily. 11/15/17    Plotnikov, Georgina Quint, MD  nitroGLYCERIN (NITROSTAT) 0.4 MG SL tablet Place 1 tablet (0.4 mg total) under the tongue every 5 (five) minutes as needed for chest pain. 06/30/17 10/24/21  Plotnikov, Georgina Quint, MD    Family History Family History  Problem Relation Age of Onset  . Heart disease Father   . CVA Mother   . Heart disease Maternal Grandfather     Social History Social History   Tobacco Use  . Smoking status: Former Smoker    Types: Cigarettes    Last attempt to quit: 2018    Years since quitting: 1.0  . Smokeless tobacco: Never Used  Substance Use Topics  . Alcohol use: Yes    Alcohol/week: 0.0 oz    Comment: occasional  . Drug use: No     Allergies   Sulfonamide derivatives   Review of Systems Review of Systems   Physical Exam Updated Vital Signs BP 122/77   Pulse 63   Temp 97.6 F (36.4 C) (Oral)   Resp 13   SpO2 91%   Physical Exam  Constitutional: He is oriented to person, place, and time. He appears well-developed and well-nourished.  HENT:  Head: Normocephalic and atraumatic.  Right Ear: External ear normal.  Left Ear: External ear normal.  Eyes: Conjunctivae and EOM are normal. Pupils are equal, round, and reactive to light.  Neck: Normal range of motion and phonation normal. Neck supple.  Cardiovascular: Normal rate, regular rhythm and normal heart sounds.  Pulmonary/Chest: Effort normal and breath sounds normal. He exhibits no bony tenderness.  Abdominal: Soft. There is no tenderness.  Musculoskeletal: Normal range of motion.  Neurological: He is alert and oriented to person, place, and time.  No dysarthria. Mild right hemiplegia  Skin: Skin is warm, dry and intact.  Psychiatric: He has a normal mood and affect. His behavior is normal. Judgment and thought content normal.  Nursing note and vitals reviewed.    ED Treatments / Results  Labs (all labs ordered are listed, but only abnormal results are displayed) Labs Reviewed  I-STAT CHEM  8, ED - Abnormal; Notable for the following components:      Result Value   Creatinine, Ser 0.60 (*)    Calcium, Ion 1.10 (*)    All other components within normal limits  PROTIME-INR  APTT  CBC  DIFFERENTIAL  COMPREHENSIVE METABOLIC PANEL  I-STAT TROPONIN, ED  CBG MONITORING, ED    EKG  EKG Interpretation  Date/Time:  Saturday November 27 2017 18:04:49 EST Ventricular Rate:  69 PR Interval:  196 QRS Duration: 88 QT Interval:  420 QTC Calculation: 450 R Axis:   83 Text Interpretation:  Normal sinus rhythm Normal ECG Since last tracing QT is now normal Confirmed by Mancel Bale (  [id number]) on 11/27/2017 9:11:39 PM       Radiology Ct Head Wo Contrast  Result Date: 11/27/2017 CLINICAL DATA:  Episode of numbness to LEFT hand lasting 5 minutes yesterday, today with numbness in LEFT hand, LEFT arm, LEFT ear and jaw lasting 10 minutes which has since resolved, history of stroke in October 2018, coronary artery disease, hypertension, hyperlipidemia, former smoker EXAM: CT HEAD WITHOUT CONTRAST TECHNIQUE: Contiguous axial images were obtained from the base of the skull through the vertex without intravenous contrast. Sagittal and coronal MPR images reconstructed from axial data set. COMPARISON:  10/23/2017 ; interval MR brain 11/21/2017 FINDINGS: Brain: Generalized atrophy. Normal ventricular morphology. No midline shift or mass effect. Small vessel chronic ischemic changes of deep cerebral white matter. Again identified area of infarction at the high LEFT parietal region with interval resolution of the high attenuation hemorrhagic focus seen on the previous exam. New area of low attenuation at the LEFT occipital lobe since the prior CT corresponding to hemorrhagic infarct identified on interval MR. No additional intracranial hemorrhage, mass lesion, evidence of acute infarction, or extra-axial fluid collection. Vascular: No hyperdense vessels. Mild atherosclerotic calcification of internal carotid  arteries at skullbase Skull: Intact Sinuses/Orbits: Minimal fluid or mucus dependently in sphenoid sinus. Minimal mucosal thickening or small mucosal retention cyst in a RIGHT ethmoid air cell. Remaining visualized paranasal sinuses and mastoid air cells clear. Nasal septal deviation to the RIGHT. Other: N/A IMPRESSION: Atrophy with small vessel chronic ischemic changes of deep cerebral white matter. Evolving high LEFT posterior parietal infarct with resolution of the high attenuation hematoma component since previous study. Evolving hemorrhagic infarct at LEFT occipital lobe. No new intracranial abnormalities. Electronically Signed   By: Ulyses Southward M.D.   On: 11/27/2017 19:31    Procedures Procedures (including critical care time)  Medications Ordered in ED Medications - No data to display   Initial Impression / Assessment and Plan / ED Course  I have reviewed the triage vital signs and the nursing notes.  Pertinent labs & imaging results that were available during my care of the patient were reviewed by me and considered in my medical decision making (see chart for details).  Clinical Course as of Nov 27 2213  Sat Nov 27, 2017  2112 No new stroke.  Evolution of prior parietal stroke. CT HEAD WO CONTRAST [EW]  2141 The case was discussed in detail with the neuro hospitalist, Dr. Otelia Limes.  Patient has shown a propensity to bleed as indicated by MRI done, 1 week ago.  Therefore he cannot be on antiplatelets or anticoagulants.  His symptoms today could be a TIA.  They are not suspected to be seizure related since his EEG which was done 2 days ago indicates only left-sided symptoms from the prior hemorrhagic region.  Additional TIA workup could include repeat MRI, with MRA, and possibly carotid studies.  However the resulting finding of either stroke, or need for anticoagulants, would be precluded by the recent bleeding, and the lack of possibility of initiating "blood thinners."  This thought  process will be discussed with the family, for informed decision making.  [EW]  2209 Tensive conversation with family, including patient, wife and daughter regarding evaluation and treatment options.  At this point they are comfortable with discharge and outpatient follow-up with his neurologist which is an acceptable management strategy at this time.  [EW]    Clinical Course User Index [EW] Mancel Bale, MD     Patient Vitals for the past 24 hrs:  BP Temp Temp src Pulse Resp SpO2  11/27/17 2115 122/77 - - 63 13 91 %  11/27/17 2100 123/71 - - 69 13 91 %  11/27/17 2030 121/72 - - 63 17 92 %  11/27/17 1922 121/82 97.6 F (36.4 C) Oral 71 18 94 %  11/27/17 1756 137/86 98.3 F (36.8 C) Oral 72 18 95 %    10:15 PM Reevaluation with update and discussion. After initial assessment and treatment, an updated evaluation reveals no change in clinical status.  Findings discussed with patient and family members.  All questions answered. Mancel BaleElliott Merrik Puebla    Final Clinical Impressions(s) / ED Diagnoses   Final diagnoses:  TIA (transient ischemic attack)    Evaluation is consistent with TIA.  Patient cannot be put on anticoagulants, and antiplatelet medications because of recent intracranial bleeding, and evidence for microhemorrhages on recent MRI.  Therefore the best strategy to prevent progression of symptoms of nonbleeding TIA/CVA, is to control risk factors.  Blood pressure is currently well controlled.  He has stopped smoking.  He has never had diabetes.  He has access to follow-up care with his neurologist for definitive management.  Nursing Notes Reviewed/ Care Coordinated Applicable Imaging Reviewed Interpretation of Laboratory Data incorporated into ED treatment  The patient appears reasonably screened and/or stabilized for discharge and I doubt any other medical condition or other [facility] requiring further screening, evaluation, or treatment in the ED at this time prior to discharge.  Plan:  Home Medications-continue current medications; Home Treatments-make sure you are eating and drinking well; return here if the recommended treatment, does not improve the symptoms; Recommended follow up-call neurology for follow-up appointment as soon as possible.   ED Discharge Orders    None       Mancel BaleWentz, Tarrin Menn, MD 11/27/17 2216    Mancel BaleWentz, Marielena Harvell, MD 12/21/17 [phone]

## 2017-11-27 NOTE — Telephone Encounter (Signed)
Wife called re: numbness, again transient for a few minutes, but not just hand, also arm, again on the previously unaffected side from prior stroke. She recalled we spoke yesterday, and that I recommended, TIA/stroke can only be investigated in ER but they decided not to go yesterday. They are again advised, that TIA is a possibility. Best to get checked out in ER, fastest to call 911, she states that he is back to baseline and that she can have him in the ER in 15 min.

## 2017-11-27 NOTE — Discharge Instructions (Signed)
Your symptoms are consistent with having had a TIA.  This could evolve into a small stroke in the right brain.  However since we are unable to place you on anticoagulants or antiplatelet medications, this is a difficult problem to treat.  Please discuss these issues with your primary neurologist and determine if you need to have additional testing including another MRI, and possibly carotid Doppler studies.  You can return here if needed for progressive or worsening symptoms or other concerns.  Make sure that you are drinking plenty of fluids, and eating 3 meals each day.  Be careful to avoid getting your blood pressure too high, or too low.  Do not resume smoking.

## 2017-11-29 ENCOUNTER — Telehealth: Payer: Self-pay | Admitting: Neurology

## 2017-11-29 NOTE — Telephone Encounter (Signed)
Revised. 

## 2017-11-29 NOTE — Telephone Encounter (Addendum)
Dr. Pearlean BrownieSethi does pt need to be seen sooner. See hospital notes thanks.Pt was last seen 10/2017.

## 2017-11-29 NOTE — Telephone Encounter (Signed)
Pt was seen at ED 11/27/17 for TIA, she said he needs to be seen asap. An appt has been scheduled for 3/12, 1st available. Can this pt see NP? Please call to advise

## 2017-11-30 ENCOUNTER — Ambulatory Visit: Payer: Medicare HMO

## 2017-11-30 ENCOUNTER — Other Ambulatory Visit: Payer: Self-pay

## 2017-11-30 ENCOUNTER — Encounter: Payer: Self-pay | Admitting: Physical Therapy

## 2017-11-30 ENCOUNTER — Ambulatory Visit: Payer: Medicare HMO | Admitting: Occupational Therapy

## 2017-11-30 ENCOUNTER — Encounter: Payer: Self-pay | Admitting: Occupational Therapy

## 2017-11-30 ENCOUNTER — Ambulatory Visit: Payer: Medicare HMO | Admitting: Physical Therapy

## 2017-11-30 DIAGNOSIS — R208 Other disturbances of skin sensation: Secondary | ICD-10-CM

## 2017-11-30 DIAGNOSIS — R2689 Other abnormalities of gait and mobility: Secondary | ICD-10-CM

## 2017-11-30 DIAGNOSIS — R278 Other lack of coordination: Secondary | ICD-10-CM

## 2017-11-30 DIAGNOSIS — R2681 Unsteadiness on feet: Secondary | ICD-10-CM

## 2017-11-30 DIAGNOSIS — M6281 Muscle weakness (generalized): Secondary | ICD-10-CM

## 2017-11-30 DIAGNOSIS — R41841 Cognitive communication deficit: Secondary | ICD-10-CM

## 2017-11-30 DIAGNOSIS — I69218 Other symptoms and signs involving cognitive functions following other nontraumatic intracranial hemorrhage: Secondary | ICD-10-CM

## 2017-11-30 DIAGNOSIS — I69251 Hemiplegia and hemiparesis following other nontraumatic intracranial hemorrhage affecting right dominant side: Secondary | ICD-10-CM

## 2017-11-30 DIAGNOSIS — M25511 Pain in right shoulder: Secondary | ICD-10-CM

## 2017-11-30 NOTE — Therapy (Signed)
[facility] [address] [place], [place], [address] Phone: [phone]   Fax:  [phone]  Occupational Therapy Treatment  Patient Details  Name: Jaime Pierce MRN: [id number] Date of Birth: [birth year] Referring Provider (Historical): Dr. Dow AdolphZachery Swartz   Encounter Date: 11/30/2017  OT End of Session - 11/30/17 1431    Visit Number  15    Number of Visits  24    Date for OT Re-Evaluation  12/18/17    Authorization Type  [employer] MCR - G code required    Authorization - Visit Number  15    OT Start Time  1317    OT Stop Time  1410    OT Time Calculation (min)  53 min    Activity Tolerance  Patient tolerated treatment well       Past Medical History:  Diagnosis Date  . Coronary artery disease   . Dyspnea on exertion   . Hemorrhagic stroke (HCC)   . Hyperlipidemia   . Hypertension   . Seizures (HCC)   . Stroke (HCC)   . Tobacco abuse     Past Surgical History:  Procedure Laterality Date  . CORONARY ARTERY BYPASS GRAFT      There were no vitals filed for this visit.  Subjective Assessment - 11/30/17 1323    Subjective   I get pain sometimes in my shoulder    Patient is accompained by:  Family member    Pertinent History  ICH Lt parietal lobe 09/01/17. PMH: CAD, SAH, COPD, s/p CABG x 6    Limitations  Recent partial seizure 10/23/17 - no estim unless specific MD clearance, no driving for 6 months    Patient Stated Goals  be independent again    Currently in Pain?  No/denies                   OT Treatments/Exercises (OP) - 11/30/17 0001      Neurological Re-education Exercises   Other Exercises 1  Neuro re ed in sidelying initially with RUE on small ball to faciltiate pt "letting go of " R shoulder and then working toward low to mid reach in on moveable surface in closed chain activity. Pt holds signfiicantly through upper trunk/shoulder girdle to substitute for lower trunk instability and  inactivity especially against gravity.  With cueing, repetition and facilitation, pt able to achieve 90* of shoulder flexion in this position with normal movement patttern.  Transitioned into supine.  Addressed shoulder flexion with assist to 90* and then pt able to actively work toward 90* of shoulder flexion with chest presses in closed chain position  with heavy facilitation at shoulder girdle. Pt responds best to cue of "reach with your hand and your elbow" and immediate feedback to stop shoulder hike/IR./elbow flexion to complete task. By end of session pt able to independently move in and out of chest press as well as hold PVC square at 90* with only facilitation at scapula.        Manual Therapy   Manual Therapy  Joint mobilization;Soft tissue mobilization;Scapular mobilization    Manual therapy comments  joint, soft tissue and scapular mob to address shoulder girdle alignment prior to neuro re ed. Pt with significant malalignment, superior/anterior subluxation and signficant tightness through out the shoulder girdle. tolerated well.                OT Short Term Goals - 11/30/17 1428      OT SHORT TERM  GOAL #1   Title  Independent with initial HEP     Status  On-going      OT SHORT TERM GOAL #2   Title  Pt/wife to verbalize understanding with edema management (including compression glove) and safety considerations w/ lack of sensation Rt hand    Status  Achieved      OT SHORT TERM GOAL #3   Title  Pt to demo 25 degrees shoulder flexion in prep for low level reaching RUE    Status  Achieved      OT SHORT TERM GOAL #4   Title  Pt to consistently be mod I level for dressing and bathing with A/E and task modifications prn    Status  On-going      OT SHORT TERM GOAL #5   Title  Pt to use Rt hand to feed self finger foods 50% of the time    Status  On-going      OT SHORT TERM GOAL #6   Title  Pt to perform shower transfer with CGA only using DME prn    Status  On-going         OT Long Term Goals - 11/30/17 1429      OT LONG TERM GOAL #1   Title  Pt to be independent with updated HEP     Time  8    Period  Weeks    Status  New      OT LONG TERM GOAL #2   Title  Pt to demo shoulder flexion to 40 degrees or greater for consistent low level reaching    Time  8    Period  Weeks    Status  New      OT LONG TERM GOAL #3   Title  Pt to improve RUE functional use as evidenced by performing 10 blocks on Box & Blocks test    Time  8    Period  Weeks    Status  New      OT LONG TERM GOAL #4   Title  Pt to use Rt hand to write name at 75% legibility or greater with A/E prn    Time  8    Period  Weeks    Status  New      OT LONG TERM GOAL #5   Title  Pt to use Rt hand for eating and grooming 75% of the time    Time  8    Period  Weeks    Status  New      OT LONG TERM GOAL #6   Title  Pt to return to simple cooking task with distant supervision and A/E prn    Time  8    Period  Weeks    Status  New            Plan - 11/30/17 1429    Clinical Impression Statement  Pt with good progress today toward improved RUE shoulder alignment, ability to "let go " of shoulder and more normal movement patterns with faciltation.     Rehab Potential  Good    OT Frequency  2x / week    OT Duration  8 weeks    OT Treatment/Interventions  Self-care/ADL training;Moist Heat;DME and/or AE instruction;Splinting;Contrast Bath;Compression bandaging;Therapeutic activities;Ultrasound;Therapeutic exercise;Cognitive remediation/compensation;Neuromuscular education;Functional Mobility Training;Passive range of motion;Visual/perceptual remediation/compensation;Electrical Stimulation;Paraffin;Manual Therapy;Patient/family education    Plan  manual therapy to address shoulder/scap alignment, NMR for RUE/trunk. focus on activation of lower trunk on  upper trunk, arm on body movements in sidelying, sitting and supine.        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 Partial seizure (HCC) 11/08/2017  . Positive colorectal cancer screening using Cologuard test 10/22/2017  . Spastic hemiparesis (HCC)   . Cough   . 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 Pain   . Hyponatremia   . Benign essential HTN   . 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 Gait disturbance, post-stroke   . Aphasia as late effect of stroke   . SAH (subarachnoid hemorrhage) (HCC) 09/02/2017  . B12 deficiency 09/02/2017  . Well adult exam 06/16/2016  . Cigarette smoker 02/23/2016  . COPD GOLD II  12/24/2014  . CAD (coronary artery disease) 07/22/2011  . HTN (hypertension) 07/22/2011  . Dyslipidemia 07/22/2011  . Smoker 07/22/2011    Norton Pastel, OTR/L 11/30/2017, 2:33 PM  [facility] [address] [place], [place], [address] Phone: [phone]   Fax:  [phone]  Name: Jaime Pierce MRN:  [id number] Date of Birth: July 08, 1946

## 2017-11-30 NOTE — Telephone Encounter (Signed)
Pt was last seen 11/10/2017 by DR.[name]  RN call patient back about her husband EEG. Rn stated Dr. Pearlean BrownieSethi was on vacation, and was is working in the hospital this week. Rn stated a message has been sent to DR. [name] about pts recent ED visit, and EEG, MRI results. Rn stated there is no opening appts in January and February. Rn stated she can call back for any cancellations. Rn stated pt is schedule for Dr. Pearlean BrownieSEthi on 02/01/2018. Message sent to DR. [name].

## 2017-11-30 NOTE — Telephone Encounter (Signed)
Patient wife presented to the lobby requesting results of EEG and a sooner apt than the one she was given for March. Best call back is [phone].

## 2017-11-30 NOTE — Telephone Encounter (Signed)
I called and left a message on the patient's listed phone communicating the results of EEG study on 11/25/17 showing no worrisome or unexpected findings. Focal slowing of the brain on the left side is to be expected given his previous stroke in that region. CT scan of the head on 11/27/17 also showed expected evolutionary findings from his previous stroke. No new or worrisome findings. There was advised to call me if they have further questions

## 2017-12-01 ENCOUNTER — Other Ambulatory Visit: Payer: Self-pay

## 2017-12-01 ENCOUNTER — Encounter: Payer: Self-pay | Admitting: Physical Therapy

## 2017-12-01 ENCOUNTER — Ambulatory Visit: Payer: Medicare HMO

## 2017-12-01 ENCOUNTER — Ambulatory Visit: Payer: Medicare HMO | Admitting: Physical Therapy

## 2017-12-01 ENCOUNTER — Ambulatory Visit: Payer: Medicare HMO | Admitting: Occupational Therapy

## 2017-12-01 DIAGNOSIS — I69251 Hemiplegia and hemiparesis following other nontraumatic intracranial hemorrhage affecting right dominant side: Secondary | ICD-10-CM

## 2017-12-01 DIAGNOSIS — M6281 Muscle weakness (generalized): Secondary | ICD-10-CM

## 2017-12-01 DIAGNOSIS — R208 Other disturbances of skin sensation: Secondary | ICD-10-CM

## 2017-12-01 DIAGNOSIS — M25511 Pain in right shoulder: Secondary | ICD-10-CM

## 2017-12-01 DIAGNOSIS — R41841 Cognitive communication deficit: Secondary | ICD-10-CM

## 2017-12-01 DIAGNOSIS — R2689 Other abnormalities of gait and mobility: Secondary | ICD-10-CM

## 2017-12-01 NOTE — Therapy (Signed)
[facility] [address] [place], [place], [address] Phone: [phone]   Fax:  [phone]  Physical Therapy Treatment  Patient Details  Name: Jaime Pierce MRN: [id number] Date of Birth: 06-02-1946 Referring Provider: Meredith Staggers, MD   Encounter Date: 12/01/2017  PT End of Session - 12/01/17 1629    Visit Number  17    Number of Visits  25    Date for PT Re-Evaluation  12/18/17    Authorization Type  Aetna Medicare     PT Start Time  1017    PT Stop Time  1100    PT Time Calculation (min)  43 min    Activity Tolerance  Patient limited by fatigue;Patient limited by pain    Behavior During Therapy  [facility] for tasks assessed/performed       Past Medical History:  Diagnosis Date  . Coronary artery disease   . Dyspnea on exertion   . Hemorrhagic stroke ([place])   . Hyperlipidemia   . Hypertension   . Seizures ([place])   . Stroke ([place])   . Tobacco abuse     Past Surgical History:  Procedure Laterality Date  . CORONARY ARTERY BYPASS GRAFT      There were no vitals filed for this visit.  Subjective Assessment - 12/01/17 1022    Subjective  Is going to re-order a large Foot up brace.  Still having a little fatigue but less than yesterday.  No further episodes of L sided weakness since the weekend.  Notices less tremors in L side after working with OT    Patient is accompained by:  Family member    Pertinent History  CAD with CABG, DOE, hyperlipidemia, HTN and tobacco abuse    Limitations  Standing;Walking    Patient Stated Goals  To be able to do the things he did before: mow with his tractor, get into and out of the boat for fishing!    Currently in Pain?  No/denies                      [facility] Adult PT Treatment/Exercise - 12/01/17 1303      Ambulation/Gait   Ambulation/Gait  Yes    Ambulation/Gait Assistance  3: Mod assist    Ambulation/Gait Assistance Details  due to increased tone and  fatigue pt with increased difficulty advancing RLE; required verbal cues and facilitation for L lateral weight shift, trunk elongation, for full RLE clearance with heel strike at initial stance and for RLE extension activation during stance phase    Ambulation Distance (Feet)  100 Feet    Assistive device  Straight cane    Gait Pattern  Step-through pattern;Decreased step length - right;Decreased step length - left;Decreased stride length;Decreased hip/knee flexion - right;Decreased dorsiflexion - right;Decreased weight shift to left;Right foot flat;Left foot flat;Right flexed knee in stance;Decreased trunk rotation;Trunk flexed;Poor foot clearance - right    Ambulation Surface  Level;Indoor      Neuro Re-ed    Neuro Re-ed Details   Attempted to progress NMR for WB, weight shifting and proximal stability training with tall kneeling on mat beginning with bilat UE support on bench progressing to RUE support only during tall kneeling squats in midline and then to R and L.  Attempted to transition to half kneeling with L foot forwards with support through RUE and RLE but pt unable to tolerate due to hip and back pain/tension.  Pt demonstrated improved shoulder and  scapular positioning on R but noted to be extending through low back with decreased terminal hip extension with glutes and hamstring mm activation.  Transitioned to weight shift and WB training on R UE and LE in standing with RUE supported on bench on mat while performing L knee advancement onto and off of mat with therapist providing verbal and tactile cues for full lateral weight shift to R and activation of RUE and RLE into extension during stance phase for increased stability, 10 reps             PT Education - 12/01/17 1629    Education provided  Yes    Education Details  gait with cane    Person(s) Educated  Patient;Spouse    Methods  Explanation;Demonstration    Comprehension  Need further instruction       PT Short Term Goals  - 11/17/17 2035      PT SHORT TERM GOAL #1   Title  Pt will participate in further assessment of balance with TUG and five time sit to stand with RW    Time  4    Period  Weeks    Status  Achieved      PT SHORT TERM GOAL #2   Title  Pt will perform sit <> stand and stand pivot transfers with LRAD and supervision with <10% cues for safety/sequencing    Baseline  MET 11/17/2017    Time  4    Period  Weeks    Status  Achieved      PT SHORT TERM GOAL #3   Title  Pt will ambulate x 250' on level, indoor surfaces (to/from waiting area) and will negotiate short ramp to simulate home entry/exit with LRAD and min A     Baseline  MET 11/17/2017    Time  4    Period  Weeks    Status  Achieved      PT SHORT TERM GOAL #4   Title  Pt will improve gait velocity to > or = 1.0 ft/sec    Baseline  NOT MET 11/17/2017  Gait Velocity 0.61 ft/sec    Time  4    Period  Weeks    Status  Not Met      PT SHORT TERM GOAL #5   Title  Pt will initiate gait training with cane or lesser AD over level, indoor surfaces with therapy    Baseline  MET 11/17/2017    Time  4    Period  Weeks    Status  Achieved        PT Long Term Goals - 10/28/17 1256      PT LONG TERM GOAL #1   Title  Pt and wife will demonstrate independence with HEP    Time  8    Period  Weeks    Status  New    Target Date  12/18/16      PT LONG TERM GOAL #2   Title  Pt will decrease TUG time by  seconds with LRAD to </=47 sec. to decr. falls risk.    Baseline  62.62 sec. with RW and R hand orthosis on RW    Time  8    Period  Weeks    Status  New    Target Date  12/18/16      PT LONG TERM GOAL #3   Title  Pt will improve LE strength as indicated by decrease in five time sit to stand  to <12 sec.    Baseline  17.27 seconds    Time  8    Period  Weeks    Status  New    Target Date  12/18/16      PT LONG TERM GOAL #4   Title  Pt will perform bed mobility and all transfers with LRAD MOD I    Time  8    Period  Weeks     Status  New    Target Date  12/18/16      PT LONG TERM GOAL #5   Title  Pt will ambulate >300 over indoor and paved outdoor surfaces and negotiate ramp with LRAD and supervision for more independent household and community mobility    Time  8    Period  Weeks    Status  New    Target Date  12/18/16      PT LONG TERM GOAL #6   Title  Pt will improve gait velocity to > or = 1.4f/sec with LRAD    Time  8    Period  Weeks    Status  New    Target Date  12/18/16      PT LONG TERM GOAL #7   Title  Pt will improve overall function on FOTO by 25 points    Baseline  28%    Time  8    Period  Weeks    Status  New    Target Date  12/18/16            Plan - 12/01/17 1635    Clinical Impression Statement  Attempted to progress NMR for WB and activation on R side with quadruped, tall kneeling and half kneeling on the mat today but pt significantly limited by fatigue and pain in his lower back and hips.  Pt able to tolerate modified WB in standing with min to moderate activation of RUE and RLE during LLE advancement.  Continued gait training with cane but due to fatigue and increased tone pt experienced increased difficulty advancing and clearing RLE.  Will continue to progress NMR/WB as pt is able to tolerate and will continue to progress towards LTG    Rehab Potential  Good    PT Frequency  3x / week    PT Duration  8 weeks    PT Treatment/Interventions  ADLs/Self Care Home Management;Electrical Stimulation;DME Instruction;Gait training;Stair training;Functional mobility training;Therapeutic activities;Therapeutic exercise;Balance training;Neuromuscular re-education;Cognitive remediation;Patient/family education;Orthotic Fit/Training;Passive range of motion;Energy conservation;Taping;Manual techniques    PT Next Visit Plan  NO ESTIM/BIONESS DUE TO RECENT SEIZURES PER DR. SDEYCXK Continue ramp training with RW, stair training, and SBQC for short distances with foot up.  Breathing with  postural exercises. Could try NMR in supported quadruped - work towards tall kneeling, half kneeling with support    PT Home Exercise Plan  pt goals include mowing with his tractor and fishing on his boat - incorporate into balance activities    Consulted and Agree with Plan of Care  Patient;Family member/caregiver    Family Member Consulted  wife       Patient will benefit from skilled therapeutic intervention in order to improve the following deficits and impairments:  Abnormal gait, Cardiopulmonary status limiting activity, Decreased balance, Decreased cognition, Decreased endurance, Decreased mobility, Decreased strength, Difficulty walking, Increased muscle spasms, Impaired sensation, Impaired tone, Impaired UE functional use, Postural dysfunction, Pain, Decreased range of motion, Increased edema  Visit Diagnosis: 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 Partial seizure ([place]) 11/08/2017  . Positive colorectal cancer screening using Cologuard test 10/22/2017  . Spastic hemiparesis ([place])   . Cough   . Nontraumatic hemorrhage of left cerebral hemisphere ([place])   . Pain   . Hyponatremia   . Benign essential HTN   . 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 Gait disturbance, post-stroke   . Aphasia as late effect of stroke   . SAH (subarachnoid hemorrhage) ([place]) 09/02/2017  . B12 deficiency 09/02/2017  . Well adult exam 06/16/2016  . Cigarette smoker 02/23/2016  . COPD GOLD II  12/24/2014  . CAD (coronary artery disease) 07/22/2011  . HTN (hypertension) 07/22/2011  . Dyslipidemia 07/22/2011  . Smoker 07/22/2011    Rico Junker, PT, DPT 12/01/17    4:41  PM    [facility] [address] [place], [place], [address] Phone: [phone]   Fax:  [phone]  Name: Jaime Pierce MRN: [id number] Date of Birth: 04/17/1946

## 2017-12-01 NOTE — Therapy (Signed)
[facility] [address] [place], [place], [address] Phone: [phone]   Fax:  [phone]  Speech Language Pathology Treatment  Patient Details  Name: Jaime Pierce MRN: [id number] Date of Birth: 01/06/1946 Referring Provider: Swartz, Zachary, MD   Encounter Date: 12/01/2017  End of Session - 12/01/17 1251    Visit Number  12    Number of Visits  25    Date for SLP Re-Evaluation  12/31/17    SLP Start Time  1105 12 minutes late from OT    SLP Stop Time   1146    SLP Time Calculation (min)  41 min    Activity Tolerance  Patient tolerated treatment well       Past Medical History:  Diagnosis Date  . Coronary artery disease   . Dyspnea on exertion   . Hemorrhagic stroke ([facility])   . Hyperlipidemia   . Hypertension   . Seizures ([facility])   . Stroke ([facility])   . Tobacco abuse     Past Surgical History:  Procedure Laterality Date  . CORONARY ARTERY BYPASS GRAFT      There were no vitals filed for this visit.  Subjective Assessment - 12/01/17 1106    Subjective  "They wore me out!"     Patient is accompained by:  -- wife    Currently in Pain?  No/denies            ADULT SLP TREATMENT - 12/01/17 1107      General Information   Behavior/Cognition  Alert;Cooperative;Pleasant mood;Distractible      Treatment Provided   Treatment provided  Cognitive-Linquistic      Cognitive-Linquistic Treatment   Treatment focused on  Cognition    Skilled Treatment  Pt did not complete homework provided yesterday. "I took a nap yesterday when I got home and just never got around to it." In a simple task for computation (mental computation to focus on attention and emergent awareness), pt req'd extra time consistently for computing addition (prices) and mod A for emergent awareness for errors. In another computation task (computing prices) extra time was again necessary and SLP cues for emergent awareness were also again  necessary. Pt unable to move decimal place correctly and req'd mod cues to solve the problem he had with the wrong decimal. Pt req'd min nonverbal cues to solve problem of his inorrect computation. Pt told SLP two non-physcial deficits with min-mod SLP cues (redued intellectual awareness). SLP explained pt's deficits and highlighted decr'd awaeness and attention in therapy tasks today.       Assessment / Recommendations / Plan   Plan  Continue with current plan of care      Progression Toward Goals   Progression toward goals  Progressing toward goals needs to incr awareness of deficits         SLP Short Term Goals - 12/01/17 1302      SLP SHORT TERM GOAL #1   Title  pt will demo emergent awareness 90% of the time in mod complex cognitive linguistic tasks    Status  Not Met      SLP SHORT TERM GOAL #2   Title  pt will recall memory strategies (WARM) with modified independence over three sessions    Status  Not Met      SLP SHORT TERM GOAL #3   Title  pt will tell SLP three non-physical deficits over two sessions    Status  Partially Met        SLP SHORT TERM GOAL #4   Title  pt will complete min-mod complex reasoning/problem solving tasks involving money/time/etc with 95% success over three sessions    Status  Not Met       SLP Long Term Goals - 12/01/17 1302      SLP LONG TERM GOAL #1   Title  pt will use 1 memory strategy in 3 therapy sessions    Time  4    Period  Weeks    Status  On-going      SLP LONG TERM GOAL #2   Title  pt will demo WFL executive function skills when presented with simple problems in everyday tasks over three sessions, with occasional min A    Time  4    Period  Weeks    Status  On-going      SLP LONG TERM GOAL #3   Title  pt will demo simple alternating attention with 85% success on both tasks over three sessions    Time  4    Period  Weeks    Status  On-going      SLP LONG TERM GOAL #4   Title  pt will use anticipatory awareness by double  checking written cognitive linguistic tasks with usual nonverbal cues in 3 sessions    Time  4    Period  Weeks    Status  On-going       Plan - 12/01/17 1301    Clinical Impression Statement  Pt cont to exhibit cognitive deficits in attention, working memory with simple to mod complex auditory info, and emergent and anticipatory awareness. Pt req'd cues for intellectual awareness today. See "skilled intervention" for more details. Pt will benefit from skilled ST to address these deficits in order to return pt to PLOF.    Speech Therapy Frequency  3x / week    Treatment/Interventions  SLP instruction and feedback;Compensatory strategies;Patient/family education;Internal/external aids;Cognitive reorganization;Cueing hierarchy;Environmental controls;Language facilitation;Functional tasks    Potential to Achieve Goals  Good    Consulted and Agree with Plan of Care  Patient       Patient will benefit from skilled therapeutic intervention in order to improve the following deficits and impairments:   Cognitive communication deficit    Problem List Patient Active Problem List   Diagnosis Date Noted  . Partial seizure ([facility]) 11/08/2017  . Positive colorectal cancer screening using Cologuard test 10/22/2017  . Spastic hemiparesis ([facility])   . Cough   . 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 Pain   . Hyponatremia   . Benign essential HTN   . 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 Gait disturbance, post-stroke   . Aphasia as late effect of stroke   . SAH (subarachnoid hemorrhage) ([facility]) 09/02/2017  . B12 deficiency 09/02/2017  . Well adult exam 06/16/2016  . Cigarette smoker 02/23/2016  . COPD GOLD II  12/24/2014  . CAD (coronary artery disease) 07/22/2011  . HTN (hypertension) 07/22/2011  . Dyslipidemia 07/22/2011  . Smoker 07/22/2011    SCHINKE,CARL ,MS, CCC-SLP  12/01/2017, 1:03 PM  [facility] [address] [place], [place], [address] Phone: [phone]   Fax:  [phone]   Name: Jaime Pierce MRN: [id number] Date of Birth: 03/16/1946 

## 2017-12-01 NOTE — Therapy (Signed)
[facility] [address] [place], [place], [address] Phone: [phone]   Fax:  [phone]  Occupational Therapy Treatment  Patient Details  Name: Jaime FuelRichard A Ferryman MRN: [id number] Date of Birth: [birth year] Referring Provider (Historical): Dr. Dow AdolphZachery Swartz   Encounter Date: 12/01/2017  OT End of Session - 12/01/17 1444    Visit Number  16    Number of Visits  24    Date for OT Re-Evaluation  12/18/17    Authorization Type  Aetna MCR     OT Start Time  0930    OT Stop Time  1018    OT Time Calculation (min)  48 min    Activity Tolerance  Patient tolerated treatment well       Past Medical History:  Diagnosis Date  . Coronary artery disease   . Dyspnea on exertion   . Hemorrhagic stroke ([facility])   . Hyperlipidemia   . Hypertension   . Seizures ([facility])   . Stroke ([facility])   . Tobacco abuse     Past Surgical History:  Procedure Laterality Date  . CORONARY ARTERY BYPASS GRAFT      There were no vitals filed for this visit.  Subjective Assessment - 12/01/17 1020    Subjective   My shoulder feels better after yesterday's session    Patient is accompained by:  Family member    Pertinent History  ICH Lt parietal lobe 09/01/17. PMH: CAD, SAH, COPD, s/p CABG x 6    Limitations  Recent partial seizure 10/23/17 - no estim unless specific MD clearance, no driving for 6 months    Patient Stated Goals  be independent again    Currently in Pain?  No/denies                   OT Treatments/Exercises (OP) - 12/01/17 0001      Neurological Re-education Exercises   Other Exercises 1  sidelying: low range sh. flexion/ext w/ hand on ball and cues to lead with hand, mod tactile cues to encourage correct positioning and prevent compensations. Moved to supine for AA/ROM in chest press motion with min assist/mod facilitation RUE to prevent compensation while holding thick shortened pool noodle (palms facing each other)      Other Exercises 2  Prone: bilateral scapula retraction and depression with mod tactile cues Rt scapula. Pt unable to control RUE for sh. extension with RUE over EOB, therefore d/c this ex. Moved to seated position for closed chain stretches and body on arm movements. Pt required tactile cues to prevent compensation. Pt w/ decr. body awareness, sensation, and cognition which limits pt's ability to correct positioning I'ly               OT Short Term Goals - 11/30/17 1428      OT SHORT TERM GOAL #1   Title  Independent with initial HEP     Status  On-going      OT SHORT TERM GOAL #2   Title  Pt/wife to verbalize understanding with edema management (including compression glove) and safety considerations w/ lack of sensation Rt hand    Status  Achieved      OT SHORT TERM GOAL #3   Title  Pt to demo 25 degrees shoulder flexion in prep for low level reaching RUE    Status  Achieved      OT SHORT TERM GOAL #4   Title  Pt to consistently be mod I level for  dressing and bathing with A/E and task modifications prn    Status  On-going      OT SHORT TERM GOAL #5   Title  Pt to use Rt hand to feed self finger foods 50% of the time    Status  On-going      OT SHORT TERM GOAL #6   Title  Pt to perform shower transfer with CGA only using DME prn    Status  On-going        OT Long Term Goals - 11/30/17 1429      OT LONG TERM GOAL #1   Title  Pt to be independent with updated HEP     Time  8    Period  Weeks    Status  New      OT LONG TERM GOAL #2   Title  Pt to demo shoulder flexion to 40 degrees or greater for consistent low level reaching    Time  8    Period  Weeks    Status  New      OT LONG TERM GOAL #3   Title  Pt to improve RUE functional use as evidenced by performing 10 blocks on Box [employer] Blocks test    Time  8    Period  Weeks    Status  New      OT LONG TERM GOAL #4   Title  Pt to use Rt hand to write name at 75% legibility or greater with A/E prn    Time  8     Period  Weeks    Status  New      OT LONG TERM GOAL #5   Title  Pt to use Rt hand for eating and grooming 75% of the time    Time  8    Period  Weeks    Status  New      OT LONG TERM GOAL #6   Title  Pt to return to simple cooking task with distant supervision and A/E prn    Time  8    Period  Weeks    Status  New            Plan - 12/01/17 1444    Clinical Impression Statement  Pt with increased ability to prevent compensations with tactile cueing, simple verbal cues, however still dominated by synergy pattern. Pt also limited by decr. sensation, body awareness Rt side, and cognitive deficits.     Occupational Profile and client history currently impacting functional performance  PMH: CAD, HTN, SAH, s/p CABG x 6, COPD    Rehab Potential  Good    OT Frequency  2x / week    OT Duration  8 weeks    OT Treatment/Interventions  Self-care/ADL training;Moist Heat;DME and/or AE instruction;Splinting;Contrast Bath;Compression bandaging;Therapeutic activities;Ultrasound;Therapeutic exercise;Cognitive remediation/compensation;Neuromuscular education;Functional Mobility Training;Passive range of motion;Visual/perceptual remediation/compensation;Electrical Stimulation;Paraffin;Manual Therapy;Patient/family education    Plan  manual therapy to address shoulder/scap alignment, NMR for RUE/trunk. focus on activation of lower trunk on upper trunk, arm on body movements in sidelying, sitting and supine.     Consulted and Agree with Plan of Care  Patient;Family member/caregiver    Family Member Consulted  wife       Patient will benefit from skilled therapeutic intervention in order to improve the following deficits and impairments:  Decreased coordination, Decreased range of motion, Increased edema, Impaired sensation, Decreased knowledge of precautions, Impaired tone, Decreased activity tolerance, Decreased balance, Decreased knowledge of use of  DME, Impaired UE functional use, Pain, Decreased  cognition, Decreased mobility, Decreased strength, Impaired vision/preception  Visit Diagnosis: 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 Partial seizure ([facility]) 11/08/2017  . Positive colorectal cancer screening using Cologuard test 10/22/2017  . Spastic hemiparesis ([facility])   . Cough   . 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 Pain   . Hyponatremia   . Benign essential HTN   . 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 Gait disturbance, post-stroke   . Aphasia as late effect of stroke   . SAH (subarachnoid hemorrhage) ([facility]) 09/02/2017  . B12 deficiency 09/02/2017  . Well adult exam 06/16/2016  . Cigarette smoker 02/23/2016  . COPD GOLD II  12/24/2014  . CAD (coronary artery disease) 07/22/2011  . HTN (hypertension) 07/22/2011  . Dyslipidemia 07/22/2011  . Smoker 07/22/2011    Kelli Churn, OTR/L 12/01/2017, 2:46 PM  [facility] [address] [place], [place], [address] Phone: [phone]   Fax:  [phone]  Name: ELEFTHERIOS DUDENHOEFFER MRN: [id number] Date of Birth: 02/09/1946

## 2017-12-01 NOTE — Therapy (Signed)
[facility] [address] [place], [place], [address] Phone: [phone]   Fax:  [phone]  Physical Therapy Treatment  Patient Details  Name: Jaime Pierce MRN: [id number] Date of Birth: May 06, 1946 Referring Provider: Meredith Staggers, MD   Encounter Date: 11/30/2017    Past Medical History:  Diagnosis Date  . Coronary artery disease   . Dyspnea on exertion   . Hemorrhagic stroke ([place])   . Hyperlipidemia   . Hypertension   . Seizures ([place])   . Stroke ([place])   . Tobacco abuse     Past Surgical History:  Procedure Laterality Date  . CORONARY ARTERY BYPASS GRAFT      There were no vitals filed for this visit.  Subjective Assessment - 11/30/17 1445    Subjective  He ordered Foot Up Orthosis. He was in ED with diagnosis of TIA on 11/27/17. Doctor said to continue exercising, therapies & drink water. Patient reports tired still but not as weak,    Patient is accompained by:  Family member    Pertinent History  CAD with CABG, DOE, hyperlipidemia, HTN and tobacco abuse    Limitations  Standing;Walking    Patient Stated Goals  To be able to do the things he did before: mow with his tractor, get into and out of the boat for fishing!    Currently in Pain?  No/denies                      [facility] Adult PT Treatment/Exercise - 11/30/17 1445      Transfers   Transfers  Sit to Stand;Stand to Sit    Sit to Stand  4: Min guard;With upper extremity assist;From chair/3-in-1 to [facility] for stabilization    Sit to Stand Details  Verbal cues for safe use of DME/AE    Stand to Sit  5: Supervision;With upper extremity assist;To chair/3-in-1 from [facility]    Stand to Sit Details (indicate cue type and reason)  Verbal cues for safe use of DME/AE      Ambulation/Gait   Ambulation/Gait  Yes    Ambulation/Gait Assistance  5: Supervision;3: Mod assist;4: Min assist RW SBA, SBQC initial ModA progressed to MinA    Ambulation/Gait Assistance Details  RW: verbal, visual & tactile cues on step length & upright posture.  SBQC: manual cues to stabilize SBQC initially, tactile & verbal cues on sequence, upright posture, step length & wt shift. Pt had balance losses primarily when advancing RLE that requires mod to minA    Ambulation Distance (Feet)  100 Feet RW 100' X 2, SBQC 85' X 3    Assistive device  Rolling walker;Small based quad cane;Other (Comment) Foot-Up orthosis on RLE    Ambulation Surface  Indoor;Level    Gait Comments  PT demo, instructed while donning the Foot-Up Orthosis in proper donning & connecting the 2 parts.              PT Education - 11/30/17 1445    Education provided  Yes    Education Details  Foot-Up Orthosis Pt needs a large as medium was too small circumference to velcro around distal ankle. Wife to reorder size difference.    Person(s) Educated  Patient;Spouse    Methods  Explanation;Demonstration    Comprehension  Verbalized understanding       PT Short Term Goals - 11/17/17 2035      PT SHORT TERM GOAL #1   Title  Pt will  participate in further assessment of balance with TUG and five time sit to stand with RW    Time  4    Period  Weeks    Status  Achieved      PT SHORT TERM GOAL #2   Title  Pt will perform sit <> stand and stand pivot transfers with LRAD and supervision with <10% cues for safety/sequencing    Baseline  MET 11/17/2017    Time  4    Period  Weeks    Status  Achieved      PT SHORT TERM GOAL #3   Title  Pt will ambulate x 250' on level, indoor surfaces (to/from waiting area) and will negotiate short ramp to simulate home entry/exit with LRAD and min A     Baseline  MET 11/17/2017    Time  4    Period  Weeks    Status  Achieved      PT SHORT TERM GOAL #4   Title  Pt will improve gait velocity to > or = 1.0 ft/sec    Baseline  NOT MET 11/17/2017  Gait Velocity 0.61 ft/sec    Time  4    Period  Weeks    Status  Not Met      PT SHORT TERM  GOAL #5   Title  Pt will initiate gait training with cane or lesser AD over level, indoor surfaces with therapy    Baseline  MET 11/17/2017    Time  4    Period  Weeks    Status  Achieved        PT Long Term Goals - 10/28/17 1256      PT LONG TERM GOAL #1   Title  Pt and wife will demonstrate independence with HEP    Time  8    Period  Weeks    Status  New    Target Date  12/18/16      PT LONG TERM GOAL #2   Title  Pt will decrease TUG time by  seconds with LRAD to </=47 sec. to decr. falls risk.    Baseline  62.62 sec. with RW and R hand orthosis on RW    Time  8    Period  Weeks    Status  New    Target Date  12/18/16      PT LONG TERM GOAL #3   Title  Pt will improve LE strength as indicated by decrease in five time sit to stand to <12 sec.    Baseline  17.27 seconds    Time  8    Period  Weeks    Status  New    Target Date  12/18/16      PT LONG TERM GOAL #4   Title  Pt will perform bed mobility and all transfers with LRAD MOD I    Time  8    Period  Weeks    Status  New    Target Date  12/18/16      PT LONG TERM GOAL #5   Title  Pt will ambulate >300 over indoor and paved outdoor surfaces and negotiate ramp with LRAD and supervision for more independent household and community mobility    Time  8    Period  Weeks    Status  New    Target Date  12/18/16      PT LONG TERM GOAL #6   Title  Pt will improve gait velocity  to > or = 1.37f/sec with LRAD    Time  8    Period  Weeks    Status  New    Target Date  12/18/16      PT LONG TERM GOAL #7   Title  Pt will improve overall function on FOTO by 25 points    Baseline  28%    Time  8    Period  Weeks    Status  New    Target Date  12/18/16            Plan - 11/30/17 1630    Clinical Impression Statement  [facility] with skilled instruction & repetition. Foot-Up Orthosis improves RLE clearance & step length which improves balance.     Rehab  Potential  Good    PT Frequency  3x / week    PT Duration  8 weeks    PT Treatment/Interventions  ADLs/Self Care Home Management;Electrical Stimulation;DME Instruction;Gait training;Stair training;Functional mobility training;Therapeutic activities;Therapeutic exercise;Balance training;Neuromuscular re-education;Cognitive remediation;Patient/family education;Orthotic Fit/Training;Passive range of motion;Energy conservation;Taping;Manual techniques    PT Next Visit Plan  NO ESTIM/BIONESS DUE TO RECENT SEIZURES PER DR. SGGYIRS Continue ramp training with RW, stair training, and SBQC for short distances with foot up.  Breathing with postural exercises. Could try NMR in supported quadruped    PT Home Exercise Plan  pt goals include mowing with his tractor and fishing on his boat - incorporate into balance activities    Consulted and Agree with Plan of Care  Patient;Family member/caregiver    Family Member Consulted  wife       Patient will benefit from skilled therapeutic intervention in order to improve the following deficits and impairments:  Abnormal gait, Cardiopulmonary status limiting activity, Decreased balance, Decreased cognition, Decreased endurance, Decreased mobility, Decreased strength, Difficulty walking, Increased muscle spasms, Impaired sensation, Impaired tone, Impaired UE functional use, Postural dysfunction, Pain, Decreased range of motion, Increased edema  Visit Diagnosis: 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 Partial seizure ([place] 11/08/2017  . Positive colorectal cancer screening using Cologuard test 10/22/2017  . Spastic hemiparesis ([place]   . Cough   . Nontraumatic hemorrhage of left cerebral  hemisphere ([place]   . Pain   . Hyponatremia   . Benign essential HTN   . 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 Gait disturbance, post-stroke   . Aphasia as late effect of stroke   . SAH (subarachnoid hemorrhage) ([place] 09/02/2017  . B12 deficiency 09/02/2017  . Well adult exam 06/16/2016  . Cigarette smoker 02/23/2016  . COPD GOLD II  12/24/2014  . CAD (coronary artery disease) 07/22/2011  . HTN (hypertension) 07/22/2011  . Dyslipidemia 07/22/2011  . Smoker 07/22/2011    Yamari Ventola PT, DPT 12/01/2017, 6:33 AM  [facility]ST[address] [place] [address]Phone: [phone]  Fax:  [phone] Name: RBRAEDON SJOGRENMRN: [id number] of Birth: [birth year]

## 2017-12-01 NOTE — Telephone Encounter (Signed)
Rn call patients wife about an earlier appt. RN stated there is open appt on January 30,2019 at 230pm. Rn stated pt has speech therapy on that day.Pt wife stated the therapy is decreasing to 2 times a week. The ST probably will be cancel by that date. RN cancel the March appt because pt will be seen earlier. PT is to check in no later than 0215pm. THe wife did get the results of her husbands EEG,and Ct scan on her vm at home.

## 2017-12-01 NOTE — Therapy (Signed)
[facility] [address] [place], [place], [address] Phone: [phone]   Fax:  [phone]  Speech Language Pathology Treatment  Patient Details  Name: Jaime Pierce MRN: [id number] Date of Birth: 09/14/1946 Referring Provider: Alger Simons, MD   Encounter Date: 11/30/2017  End of Session - 12/01/17 1251    Visit Number  11    Number of Visits  25    Date for SLP Re-Evaluation  12/31/17    SLP Start Time  1414 12 minutes late from OT    SLP Stop Time   0175    SLP Time Calculation (min)  33 min    Activity Tolerance  Patient tolerated treatment well       Past Medical History:  Diagnosis Date  . Coronary artery disease   . Dyspnea on exertion   . Hemorrhagic stroke ([place])   . Hyperlipidemia   . Hypertension   . Seizures ([place])   . Stroke ([place])   . Tobacco abuse     Past Surgical History:  Procedure Laterality Date  . CORONARY ARTERY BYPASS GRAFT      There were no vitals filed for this visit.  Subjective Assessment - 12/01/17 1106    Subjective  "They wore me out!"     Patient is accompained by:  -- wife    Currently in Pain?  No/denies            ADULT SLP TREATMENT - 12/01/17 1107      General Information   Behavior/Cognition  Alert;Cooperative;Pleasant mood;Distractible      Treatment Provided   Treatment provided  Cognitive-Linquistic      Cognitive-Linquistic Treatment   Treatment focused on  Cognition    Skilled Treatment  Pt recalled ER visit Saturday. Pt recalled difficulty level of each deductive reasoning puzzle with appropriateness. Reasoning (similarity/differences) with min A rarely. Pt recalled his appointments at rehab for tomorrow and Thursday correctly. He told SLP 3/4 memory strategies, SLP cued pt to recall SLP "snapping a picture" to recall the 4th strategy. Demo'd decr'd emergent awareness with deductive reasoning errors. Pt cont to appear defensive when he makes  errors during sessions.       Assessment / Recommendations / Plan   Plan  Continue with current plan of care      Progression Toward Goals   Progression toward goals  Progressing toward goals will need to accept cog-ling deficits; incr awareness         SLP Short Term Goals - 11/30/17 1251      SLP SHORT TERM GOAL #1   Title  pt will demo emergent awareness 90% of the time in mod complex cognitive linguistic tasks    Status  Not Met      SLP SHORT TERM GOAL #2   Title  pt will recall memory strategies (WARM) with modified independence over three sessions    Status  Not Met      SLP SHORT TERM GOAL #3   Title  pt will tell SLP three non-physical deficits over two sessions    Status  Partially Met      SLP SHORT TERM GOAL #4   Title  pt will complete min-mod complex reasoning/problem solving tasks involving money/time/etc with 95% success over three sessions    Status  Not Met       SLP Long Term Goals - 11/30/17 1251      SLP LONG TERM GOAL #1   Title  pt will use 1 memory strategy in 3 therapy sessions    Time  4    Period  Weeks    Status  On-going      SLP LONG TERM GOAL #2   Title  pt will demo [facility] executive function skills when presented with simple problems in everyday tasks over three sessions, with occasional min A    Time  4    Period  Weeks    Status  On-going      SLP LONG TERM GOAL #3   Title  pt will demo simple alternating attention with 85% success on both tasks over three sessions    Time  4    Period  Weeks    Status  On-going      SLP LONG TERM GOAL #4   Title  pt will use anticipatory awareness by double checking written cognitive linguistic tasks with usual nonverbal cues in 3 sessions    Time  4    Period  Weeks    Status  On-going       Plan - 11/30/17 1251    Clinical Impression Statement  Pt cont to exhibit cognitive deficits in attention, working memory with simple to mod complex auditory info, and emergent and anticipatory  awareness. See "skilled intervention" for more details. Pt will benefit from skilled ST to address these deficits in order to return pt to PLOF.    Speech Therapy Frequency  3x / week    Treatment/Interventions  SLP instruction and feedback;Compensatory strategies;Patient/family education;Internal/external aids;Cognitive reorganization;Cueing hierarchy;Environmental controls;Language facilitation;Functional tasks    Potential to Achieve Goals  Good    Consulted and Agree with Plan of Care  Patient       Patient will benefit from skilled therapeutic intervention in order to improve the following deficits and impairments:   Cognitive communication deficit    Problem List Patient Active Problem List   Diagnosis Date Noted  . Partial seizure ([place]) 11/08/2017  . Positive colorectal cancer screening using Cologuard test 10/22/2017  . Spastic hemiparesis ([place])   . Cough   . Nontraumatic hemorrhage of left cerebral hemisphere ([place])   . Pain   . Hyponatremia   . Benign essential HTN   . 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 Gait disturbance, post-stroke   . Aphasia as late effect of stroke   . SAH (subarachnoid hemorrhage) ([place]) 09/02/2017  . B12 deficiency 09/02/2017  . Well adult exam 06/16/2016  . Cigarette smoker 02/23/2016  . COPD GOLD II  12/24/2014  . CAD (coronary artery disease) 07/22/2011  . HTN (hypertension) 07/22/2011  . Dyslipidemia 07/22/2011  . Smoker 07/22/2011    [facility] ,[place], [place]  12/01/2017, 12:56 PM  [facility] [address] [place], [place], [address] Phone: [phone]   Fax:  [phone]   Name: Jaime Pierce MRN: [id number] Date of Birth: 04/16/1946

## 2017-12-01 NOTE — Patient Instructions (Signed)
  Please complete the assigned speech therapy homework prior to your next session and return it to the speech therapist at your next visit.  

## 2017-12-02 ENCOUNTER — Other Ambulatory Visit: Payer: Self-pay

## 2017-12-02 ENCOUNTER — Encounter: Payer: Self-pay | Admitting: Physical Therapy

## 2017-12-02 ENCOUNTER — Ambulatory Visit: Payer: Medicare HMO | Admitting: Physical Therapy

## 2017-12-02 ENCOUNTER — Ambulatory Visit: Payer: Medicare HMO

## 2017-12-02 ENCOUNTER — Ambulatory Visit: Payer: Medicare HMO | Admitting: Occupational Therapy

## 2017-12-02 DIAGNOSIS — M25511 Pain in right shoulder: Secondary | ICD-10-CM

## 2017-12-02 DIAGNOSIS — I69251 Hemiplegia and hemiparesis following other nontraumatic intracranial hemorrhage affecting right dominant side: Secondary | ICD-10-CM

## 2017-12-02 DIAGNOSIS — M6281 Muscle weakness (generalized): Secondary | ICD-10-CM

## 2017-12-02 DIAGNOSIS — R208 Other disturbances of skin sensation: Secondary | ICD-10-CM

## 2017-12-02 DIAGNOSIS — R41841 Cognitive communication deficit: Secondary | ICD-10-CM

## 2017-12-02 DIAGNOSIS — R2689 Other abnormalities of gait and mobility: Secondary | ICD-10-CM

## 2017-12-02 NOTE — Patient Instructions (Signed)
  Please complete the assigned speech therapy homework prior to your next session and return it to the speech therapist at your next visit.  

## 2017-12-02 NOTE — Therapy (Signed)
[facility] [address] [place], [place], [address] Phone: [phone]   Fax:  [phone]  Speech Language Pathology Treatment  Patient Details  Name: Jaime Pierce MRN: [id number] Date of Birth: 01-Oct-1946 Referring Provider: Alger Simons, MD   Encounter Date: 12/02/2017  End of Session - 12/02/17 1703    Visit Number  13    Number of Visits  25    Date for SLP Re-Evaluation  12/31/17    SLP Start Time  1450    SLP Stop Time   1530    SLP Time Calculation (min)  40 min    Activity Tolerance  Patient tolerated treatment well       Past Medical History:  Diagnosis Date  . Coronary artery disease   . Dyspnea on exertion   . Hemorrhagic stroke ([place])   . Hyperlipidemia   . Hypertension   . Seizures ([place])   . Stroke ([place])   . Tobacco abuse     Past Surgical History:  Procedure Laterality Date  . CORONARY ARTERY BYPASS GRAFT      There were no vitals filed for this visit.  Subjective Assessment - 12/02/17 1514    Subjective  Pt navigated walker to ST room and down into chair independently    Currently in Pain?  No/denies            ADULT SLP TREATMENT - 12/02/17 1522      General Information   Behavior/Cognition  Alert;Cooperative;Pleasant mood;Distractible      Treatment Provided   Treatment provided  Cognitive-Linquistic      Cognitive-Linquistic Treatment   Treatment focused on  Cognition    Skilled Treatment  SLP worked with pt in emergent awareness of errors on homework (mod complex deductive reasoning). Pt req'd mod A with these novel tasks, SLP again explained to pt that skills targeted in therapy are skills he needs to use (and uses) every day, and are not just limited to the task at hand.       Assessment / Recommendations / Plan   Plan  Continue with current plan of care      Progression Toward Goals   Progression toward goals  Progressing toward goals       SLP  Education - 12/02/17 1703    Education provided  Yes    Education Details  deficit awareness    Person(s) Educated  Patient    Methods  Explanation    Comprehension  Verbalized understanding;Need further instruction       SLP Short Term Goals - 12/01/17 1302      SLP SHORT TERM GOAL #1   Title  pt will demo emergent awareness 90% of the time in mod complex cognitive linguistic tasks    Status  Not Met      SLP SHORT TERM GOAL #2   Title  pt will recall memory strategies (WARM) with modified independence over three sessions    Status  Not Met      SLP SHORT TERM GOAL #3   Title  pt will tell SLP three non-physical deficits over two sessions    Status  Partially Met      SLP SHORT TERM GOAL #4   Title  pt will complete min-mod complex reasoning/problem solving tasks involving money/time/etc with 95% success over three sessions    Status  Not Met       SLP Long Term Goals - 12/02/17 1705  SLP LONG TERM GOAL #1   Title  pt will use 1 memory strategy in 3 therapy sessions    Time  4    Period  Weeks    Status  On-going      SLP LONG TERM GOAL #2   Title  pt will demo [facility] executive function skills when presented with simple problems in everyday tasks over three sessions, with occasional min A    Time  4    Period  Weeks    Status  On-going      SLP LONG TERM GOAL #3   Title  pt will demo simple alternating attention with 85% success on both tasks over three sessions    Time  4    Period  Weeks    Status  On-going      SLP LONG TERM GOAL #4   Title  pt will use anticipatory awareness by double checking written cognitive linguistic tasks with usual nonverbal cues in 3 sessions    Time  4    Period  Weeks    Status  On-going       Plan - 12/02/17 1704    Clinical Impression Statement  Attention, working memory with simple to mod complex auditory info, and emergent and anticipatory awareness are deficits. Pt made progress today with intellectual awareness when SLP  shared pt's deficits in very concerte form with examples from session. See "skilled intervention" for more details. Pt will benefit from skilled ST to address these deficits in order to return pt to PLOF.    Speech Therapy Frequency  3x / week    Treatment/Interventions  SLP instruction and feedback;Compensatory strategies;Patient/family education;Internal/external aids;Cognitive reorganization;Cueing hierarchy;Environmental controls;Language facilitation;Functional tasks    Potential to Achieve Goals  Good    Consulted and Agree with Plan of Care  Patient       Patient will benefit from skilled therapeutic intervention in order to improve the following deficits and impairments:   Cognitive communication deficit    Problem List Patient Active Problem List   Diagnosis Date Noted  . Partial seizure ([place]) 11/08/2017  . Positive colorectal cancer screening using Cologuard test 10/22/2017  . Spastic hemiparesis ([place])   . Cough   . Nontraumatic hemorrhage of left cerebral hemisphere ([place])   . Pain   . Hyponatremia   . Benign essential HTN   . 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 Gait disturbance, post-stroke   . Aphasia as late effect of stroke   . SAH (subarachnoid hemorrhage) ([place]) 09/02/2017  . B12 deficiency 09/02/2017  . Well adult exam 06/16/2016  . Cigarette smoker 02/23/2016  . COPD GOLD II  12/24/2014  . CAD (coronary artery disease) 07/22/2011  . HTN (hypertension) 07/22/2011  . Dyslipidemia 07/22/2011  . Smoker 07/22/2011    [facility] ,[place], [place]  12/02/2017, 5:06 PM  [facility] [address] [place], [place], [address] Phone: [phone]   Fax:  [phone]   Name: HARSHIL CAVALLARO MRN: [id number] Date of Birth: [birth year]

## 2017-12-02 NOTE — Therapy (Signed)
[facility] [address] [place], [place], [address] Phone: [phone]   Fax:  [phone]  Physical Therapy Treatment  Patient Details  Name: Jaime Pierce MRN: [id number] Date of Birth: [birth year] Referring Provider: Meredith Staggers, MD   Encounter Date: 12/02/2017  PT End of Session - 12/02/17 1626    Visit Number  18    Number of Visits  25    Date for PT Re-Evaluation  12/18/17    Authorization Type  [employer]     PT Start Time  1408    PT Stop Time  1452    PT Time Calculation (min)  44 min    Activity Tolerance  Patient tolerated treatment well    Behavior During Therapy  [facility] for tasks assessed/performed       Past Medical History:  Diagnosis Date  . Coronary artery disease   . Dyspnea on exertion   . Hemorrhagic stroke ([place])   . Hyperlipidemia   . Hypertension   . Seizures ([place])   . Stroke ([place])   . Tobacco abuse     Past Surgical History:  Procedure Laterality Date  . CORONARY ARTERY BYPASS GRAFT      There were no vitals filed for this visit.  Subjective Assessment - 12/02/17 1410    Subjective  A little sore in his low back after yesterday; resolved with heat and Tylenol.  No further tremors noted.  Walked into [employer] yesterday and got himself up into the barber chair for hair cut.    Patient is accompained by:  Family member    Pertinent History  CAD with CABG, DOE, hyperlipidemia, HTN and tobacco abuse    Limitations  Standing;Walking    Patient Stated Goals  To be able to do the things he did before: mow with his tractor, get into and out of the boat for fishing!    Currently in Pain?  No/denies                      [facility] Adult PT Treatment/Exercise - 12/02/17 1424      Ambulation/Gait   Ambulation/Gait  Yes    Ambulation/Gait Assistance  3: Mod assist    Ambulation/Gait Assistance Details  with quad cane and therapist on R at scapula and pelvis  to facilitate R trunk elongation, weight shifting and full step length of RLE    Ambulation Distance (Feet)  115 Feet    Assistive device  Small based quad cane    Gait Pattern  Step-to pattern;Step-through pattern;Decreased arm swing - right;Decreased step length - right;Decreased stance time - right;Decreased stride length;Decreased hip/knee flexion - right;Decreased dorsiflexion - right;Decreased weight shift to left;Right foot flat;Right flexed knee in stance;Lateral trunk lean to right;Narrow base of support;Poor foot clearance - right    Ambulation Surface  Level;Indoor    Stairs  Yes    Stairs Assistance  4: Min assist    Stairs Assistance Details (indicate cue type and reason)  last step pt's RUE came off of the rail and pt experienced forwards LOB and required max A to return to standing and finish stepping down last step.  Overall pt demonstrated improved R foot clearance and activation in stance    Stair Management Technique  Two rails;Alternating pattern;Forwards    Number of Stairs  4    Height of Stairs  6      Neuro Re-ed    Neuro Re-ed Details  Seated on balance disc focusing on isolated pelvic tilts anterior/posterior with breathing sequence and incorporating scapular depression with thoracic extension/elongation.  Also performed lateral pelvic tilts to L and R with rib elongation and shortening for full lateral weight shifting; added in pushing through RUE on yoga block on mat for elbow extension, shoulder ER and depression while shifting weight to L side and activating L trunk             PT Education - 12/02/17 1625    Education provided  Yes    Education Details  continued gait with cane    Person(s) Educated  Patient;Spouse    Methods  Explanation;Demonstration    Comprehension  Need further instruction       PT Short Term Goals - 11/17/17 2035      PT SHORT TERM GOAL #1   Title  Pt will participate in further assessment of balance with TUG and five time sit  to stand with RW    Time  4    Period  Weeks    Status  Achieved      PT SHORT TERM GOAL #2   Title  Pt will perform sit <> stand and stand pivot transfers with LRAD and supervision with <10% cues for safety/sequencing    Baseline  MET 11/17/2017    Time  4    Period  Weeks    Status  Achieved      PT SHORT TERM GOAL #3   Title  Pt will ambulate x 250' on level, indoor surfaces (to/from waiting area) and will negotiate short ramp to simulate home entry/exit with LRAD and min A     Baseline  MET 11/17/2017    Time  4    Period  Weeks    Status  Achieved      PT SHORT TERM GOAL #4   Title  Pt will improve gait velocity to > or = 1.0 ft/sec    Baseline  NOT MET 11/17/2017  Gait Velocity 0.61 ft/sec    Time  4    Period  Weeks    Status  Not Met      PT SHORT TERM GOAL #5   Title  Pt will initiate gait training with cane or lesser AD over level, indoor surfaces with therapy    Baseline  MET 11/17/2017    Time  4    Period  Weeks    Status  Achieved        PT Long Term Goals - 10/28/17 1256      PT LONG TERM GOAL #1   Title  Pt and wife will demonstrate independence with HEP    Time  8    Period  Weeks    Status  New    Target Date  12/18/16      PT LONG TERM GOAL #2   Title  Pt will decrease TUG time by  seconds with LRAD to </=47 sec. to decr. falls risk.    Baseline  62.62 sec. with RW and R hand orthosis on RW    Time  8    Period  Weeks    Status  New    Target Date  12/18/16      PT LONG TERM GOAL #3   Title  Pt will improve LE strength as indicated by decrease in five time sit to stand to <12 sec.    Baseline  17.27 seconds    Time  8  Period  Weeks    Status  New    Target Date  12/18/16      PT LONG TERM GOAL #4   Title  Pt will perform bed mobility and all transfers with LRAD MOD I    Time  8    Period  Weeks    Status  New    Target Date  12/18/16      PT LONG TERM GOAL #5   Title  Pt will ambulate >300 over indoor and paved outdoor  surfaces and negotiate ramp with LRAD and supervision for more independent household and community mobility    Time  8    Period  Weeks    Status  New    Target Date  12/18/16      PT LONG TERM GOAL #6   Title  Pt will improve gait velocity to > or = 1.60f/sec with LRAD    Time  8    Period  Weeks    Status  New    Target Date  12/18/16      PT LONG TERM GOAL #7   Title  Pt will improve overall function on FOTO by 25 points    Baseline  28%    Time  8    Period  Weeks    Status  New    Target Date  12/18/16            Plan - 12/02/17 1627    Clinical Impression Statement  Continued to focus on trunk and pelvis dissociation, postural control and core activation during seated weight shifting forwards, backwards and laterally on unstable surface and activation through RUE and trunk during L lateral weight shifting in sitting.  Focused on carryover of postural control and weight shifting to gait with quad cane.  Pt demonstrated improved gait sequence with cane and was able to ambulate full distance around gym with improved L lateral weight shift, R trunk elongation and R foot clearance and step length with facilitation of therapist on R side.  Performed stair negotiation today with alternating sequence with improved foot clearance and activation of RLE extension in stance phase.  Will continue to progress towards LTG.    Rehab Potential  Good    PT Frequency  3x / week    PT Duration  8 weeks    PT Treatment/Interventions  ADLs/Self Care Home Management;Electrical Stimulation;DME Instruction;Gait training;Stair training;Functional mobility training;Therapeutic activities;Therapeutic exercise;Balance training;Neuromuscular re-education;Cognitive remediation;Patient/family education;Orthotic Fit/Training;Passive range of motion;Energy conservation;Taping;Manual techniques    PT Next Visit Plan  NO ESTIM/BIONESS DUE TO RECENT SEIZURES PER DR. SUKGURK Continue ramp training with RW, stair  training, and SBQC for short distances with foot up.  Breathing with postural exercises. Could try NMR in supported quadruped - work towards tall kneeling, half kneeling with support    PT Home Exercise Plan  pt goals include mowing with his tractor and fishing on his boat - incorporate into balance activities    Consulted and Agree with Plan of Care  Patient;Family member/caregiver    Family Member Consulted  wife       Patient will benefit from skilled therapeutic intervention in order to improve the following deficits and impairments:  Abnormal gait, Cardiopulmonary status limiting activity, Decreased balance, Decreased cognition, Decreased endurance, Decreased mobility, Decreased strength, Difficulty walking, Increased muscle spasms, Impaired sensation, Impaired tone, Impaired UE functional use, Postural dysfunction, Pain, Decreased range of motion, Increased edema  Visit Diagnosis: 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 Partial seizure ([place]) 11/08/2017  . Positive colorectal cancer screening using Cologuard test 10/22/2017  . Spastic hemiparesis ([place])   . Cough   . Nontraumatic hemorrhage of left cerebral hemisphere ([place])   . Pain   . Hyponatremia   . Benign essential HTN   . 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 Gait disturbance, post-stroke   . Aphasia as late effect of stroke   . SAH (subarachnoid hemorrhage) ([place]) 09/02/2017  . B12 deficiency 09/02/2017  . Well adult exam 06/16/2016  . Cigarette smoker 02/23/2016  . COPD GOLD II  12/24/2014  . CAD (coronary artery disease) 07/22/2011  . HTN (hypertension) 07/22/2011  . Dyslipidemia 07/22/2011  .  Smoker 07/22/2011   Rico Junker, PT, DPT 12/02/17    4:48 PM    [facility] [address] [place], [place], [address] Phone: [phone]   Fax:  [phone]  Name: KEANON BEVINS MRN: [id number] Date of Birth: Mar 28, 1946

## 2017-12-02 NOTE — Therapy (Signed)
[facility] [address] [place], [place], [address] Phone: [phone]   Fax:  [phone]  Occupational Therapy Treatment  Patient Details  Name: Jaime Pierce MRN: [id number] Date of Birth: 1946-01-14 Referring Provider: Ranelle OysterZachary T. Swartz, MD   Encounter Date: 12/02/2017  OT End of Session - 12/02/17 1606    Visit Number  17    Number of Visits  24    Date for OT Re-Evaluation  12/18/17    Authorization Type  [employer]na MCR     OT Start Time  1315    OT Stop Time  1400    OT Time Calculation (min)  45 min    Activity Tolerance  Patient tolerated treatment well    Behavior During Therapy  [facility] for tasks assessed/performed       Past Medical History:  Diagnosis Date  . Coronary artery disease   . Dyspnea on exertion   . Hemorrhagic stroke (HCC)   . Hyperlipidemia   . Hypertension   . Seizures (HCC)   . Stroke (HCC)   . Tobacco abuse     Past Surgical History:  Procedure Laterality Date  . CORONARY ARTERY BYPASS GRAFT      There were no vitals filed for this visit.  Subjective Assessment - 12/02/17 1321    Patient is accompained by:  Family member    Pertinent History  ICH Lt parietal lobe 09/01/17. PMH: CAD, SAH, COPD, s/p CABG x 6    Limitations  Recent partial seizure 10/23/17 - no estim unless specific MD clearance, no driving for 6 months    Patient Stated Goals  be independent again    Currently in Pain?  No/denies                   OT Treatments/Exercises (OP) - 12/02/17 0001      Neurological Re-education Exercises   Other Exercises 1  Supine: worked on scapula depression with mod to max facilitation/tactile cueing initially. After repetition, only required min cueing. Followed by AA/ROM in ER with mod facilitation and visualization techniques (d/t poor sensation, apraxia). Progressed to sidelying for scapula mobilization followed by continued work on scapula downward depression  and retraction and ER. Followed by low range sh. flexion/ext with mod facilitation and scapula and prevent activation of upper traps and accessory muscles.     Other Exercises 2  Seated: worked on lateral trunk flexion with closed chain wt. bearing RUE for downward depression - pt required max tactile cues d/t apraxia. Pt initially activating upper traps/SCM however, with cueing could turn off               OT Short Term Goals - 11/30/17 1428      OT SHORT TERM GOAL #1   Title  Independent with initial HEP     Status  On-going      OT SHORT TERM GOAL #2   Title  Pt/wife to verbalize understanding with edema management (including compression glove) and safety considerations w/ lack of sensation Rt hand    Status  Achieved      OT SHORT TERM GOAL #3   Title  Pt to demo 25 degrees shoulder flexion in prep for low level reaching RUE    Status  Achieved      OT SHORT TERM GOAL #4   Title  Pt to consistently be mod I level for dressing and bathing with A/E and task modifications prn    Status  On-going      OT SHORT TERM GOAL #5   Title  Pt to use Rt hand to feed self finger foods 50% of the time    Status  On-going      OT SHORT TERM GOAL #6   Title  Pt to perform shower transfer with CGA only using DME prn    Status  On-going        OT Long Term Goals - 11/30/17 1429      OT LONG TERM GOAL #1   Title  Pt to be independent with updated HEP     Time  8    Period  Weeks    Status  New      OT LONG TERM GOAL #2   Title  Pt to demo shoulder flexion to 40 degrees or greater for consistent low level reaching    Time  8    Period  Weeks    Status  New      OT LONG TERM GOAL #3   Title  Pt to improve RUE functional use as evidenced by performing 10 blocks on [employer] test    Time  8    Period  Weeks    Status  New      OT LONG TERM GOAL #4   Title  Pt to use Rt hand to write name at 75% legibility or greater with A/E prn    Time  8    Period  Weeks    Status   New      OT LONG TERM GOAL #5   Title  Pt to use Rt hand for eating and grooming 75% of the time    Time  8    Period  Weeks    Status  New      OT LONG TERM GOAL #6   Title  Pt to return to simple cooking task with distant supervision and A/E prn    Time  8    Period  Weeks    Status  New            Plan - 12/02/17 1607    Clinical Impression Statement  Pt with increased ability to prevent compensations with tactile cueing, simple verbal cues, however still dominated by synergy pattern. Pt also limited by decr. sensation, body awareness Rt side, and cognitive deficits.     Occupational Profile and client history currently impacting functional performance  PMH: CAD, HTN, SAH, s/p CABG x 6, COPD    Rehab Potential  Good    OT Frequency  3x / week    OT Duration  8 weeks    OT Treatment/Interventions  Self-care/ADL training;Moist Heat;DME and/or AE instruction;Splinting;Contrast Bath;Compression bandaging;Therapeutic activities;Ultrasound;Therapeutic exercise;Cognitive remediation/compensation;Neuromuscular education;Functional Mobility Training;Passive range of motion;Visual/perceptual remediation/compensation;Electrical Stimulation;Paraffin;Manual Therapy;Patient/family education    Plan  Check remaining STG's, manual therapy to address shoulder/scap alignment, NMR for RUE/trunk. focus on activation of lower trunk on upper trunk, arm on body movements in sidelying, sitting and supine.     Consulted and Agree with Plan of Care  Patient;Family member/caregiver    Family Member Consulted  wife       Patient will benefit from skilled therapeutic intervention in order to improve the following deficits and impairments:  Decreased coordination, Decreased range of motion, Increased edema, Impaired sensation, Decreased knowledge of precautions, Impaired tone, Decreased activity tolerance, Decreased balance, Decreased knowledge of use of DME, Impaired UE functional use, Pain, Decreased  cognition, Decreased mobility,  Decreased strength, Impaired vision/preception  Visit Diagnosis: 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 Partial seizure (HCC) 11/08/2017  . Positive colorectal cancer screening using Cologuard test 10/22/2017  . Spastic hemiparesis (HCC)   . Cough   . 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 Pain   . Hyponatremia   . Benign essential HTN   . 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 Gait disturbance, post-stroke   . Aphasia as late effect of stroke   . SAH (subarachnoid hemorrhage) (HCC) 09/02/2017  . B12 deficiency 09/02/2017  . Well adult exam 06/16/2016  . Cigarette smoker 02/23/2016  . COPD GOLD II  12/24/2014  . CAD (coronary artery disease) 07/22/2011  . HTN (hypertension) 07/22/2011  . Dyslipidemia 07/22/2011  . Smoker 07/22/2011    Kelli Churn, OTR/L 12/02/2017, 4:08 PM  [facility] [address] [place], [place], [address] Phone: [phone]   Fax:  [phone]  Name: Jaime Pierce MRN: [id number] Date of Birth: Sep 27, 1946

## 2017-12-03 ENCOUNTER — Encounter: Payer: Self-pay | Admitting: Internal Medicine

## 2017-12-03 ENCOUNTER — Ambulatory Visit (INDEPENDENT_AMBULATORY_CARE_PROVIDER_SITE_OTHER): Payer: Medicare HMO | Admitting: Internal Medicine

## 2017-12-03 DIAGNOSIS — E785 Hyperlipidemia, unspecified: Secondary | ICD-10-CM

## 2017-12-03 DIAGNOSIS — E538 Deficiency of other specified B group vitamins: Secondary | ICD-10-CM | POA: Diagnosis not present

## 2017-12-03 DIAGNOSIS — I609 Nontraumatic subarachnoid hemorrhage, unspecified: Secondary | ICD-10-CM

## 2017-12-03 DIAGNOSIS — R69 Illness, unspecified: Secondary | ICD-10-CM | POA: Diagnosis not present

## 2017-12-03 DIAGNOSIS — F17210 Nicotine dependence, cigarettes, uncomplicated: Secondary | ICD-10-CM

## 2017-12-03 DIAGNOSIS — I10 Essential (primary) hypertension: Secondary | ICD-10-CM | POA: Diagnosis not present

## 2017-12-03 DIAGNOSIS — G8110 Spastic hemiplegia affecting unspecified side: Secondary | ICD-10-CM | POA: Diagnosis not present

## 2017-12-03 MED ORDER — OSELTAMIVIR PHOSPHATE 75 MG PO CAPS: 75.0000 mg | ORAL_CAPSULE | Freq: Two times a day (BID) | ORAL | 0 refills | Status: DC

## 2017-12-03 NOTE — Assessment & Plan Note (Signed)
Quit 2018

## 2017-12-03 NOTE — Assessment & Plan Note (Signed)
Off Lipitor until appt w/Dr Pearlean BrownieSethi

## 2017-12-03 NOTE — Assessment & Plan Note (Signed)
On B12/complex

## 2017-12-03 NOTE — Patient Instructions (Signed)
"  sAy" "tee" -- Jaime Pierce

## 2017-12-03 NOTE — Progress Notes (Signed)
Subjective:  Patient ID: Jaime Pierce, male    DOB: [birth year]  Age: 72 y.o. MRN: [id number]  CC: No chief complaint on file.   HPI Jaime Pierce presents for hemorrhagic stroke, TIAs in the L hand/arm, seizures f/u  Outpatient Medications Prior to Visit  Medication Sig Dispense Refill  . albuterol (PROAIR HFA) 108 (90 Base) MCG/ACT inhaler 2 puffs up to every 4 hours if can't catch your breath (Patient taking differently: Inhale 1-2 puffs into the lungs every 4 (four) hours as needed for wheezing or shortness of breath. ) 1 Inhaler 11  . b complex vitamins tablet Take 1 tablet by mouth daily. 100 tablet 3  . baclofen (LIORESAL) 20 MG tablet Take 1 tablet (20 mg total) by mouth 3 (three) times daily. 90 each 4  . budesonide-formoterol (SYMBICORT) 160-4.5 MCG/ACT inhaler Take 2 puffs first thing in am and then another 2 puffs about 12 hours later. (Patient taking differently: Inhale 2 puffs into the lungs 2 (two) times daily. ) 1 Inhaler 0  . Cholecalciferol (VITAMIN D3) 2000 units capsule Take 1 capsule (2,000 Units total) by mouth daily. 100 capsule 3  . diclofenac sodium (VOLTAREN) 1 % GEL Apply 2 g topically 4 (four) times daily as needed (Pain). 3 Tube 4  . divalproex (DEPAKOTE) 500 MG DR tablet Take 1 tablet (500 mg total) by mouth daily. 30 tablet 2  . irbesartan (AVAPRO) 75 MG tablet Take 1 tablet (75 mg total) by mouth daily. 90 tablet 3  . metoprolol tartrate (LOPRESSOR) 25 MG tablet Take 0.5 tablets (12.5 mg total) by mouth 2 (two) times daily. 90 tablet 1  . nitroGLYCERIN (NITROSTAT) 0.4 MG SL tablet Place 1 tablet (0.4 mg total) under the tongue every 5 (five) minutes as needed for chest pain. 20 tablet 3  . divalproex (DEPAKOTE) 500 MG DR tablet Take 1 tablet (500 mg total) by mouth daily. 30 tablet 2   No facility-administered medications prior to visit.     ROS Review of Systems  Constitutional: Negative for appetite change, fatigue and unexpected weight change.   HENT: Negative for congestion, nosebleeds, sneezing, sore throat and trouble swallowing.   Eyes: Negative for itching and visual disturbance.  Respiratory: Negative for cough.   Cardiovascular: Negative for chest pain, palpitations and leg swelling.  Gastrointestinal: Negative for abdominal distention, blood in stool, diarrhea and nausea.  Genitourinary: Negative for frequency and hematuria.  Musculoskeletal: Negative for back pain, gait problem, joint swelling and neck pain.  Skin: Negative for rash.  Neurological: Positive for seizures, weakness and numbness. Negative for dizziness, tremors and speech difficulty.  Psychiatric/Behavioral: Negative for agitation, dysphoric mood and sleep disturbance. The patient is not nervous/anxious.     Objective:  BP 118/66 (BP Location: Left Arm, Patient Position: Sitting, Cuff Size: Normal)   Pulse 79   Temp 98.2 F (36.8 C) (Oral)   Ht 6' (1.829 m)   Wt 176 lb (79.8 kg)   SpO2 96%   BMI 23.87 kg/m   BP Readings from Last 3 Encounters:  12/03/17 118/66  11/27/17 128/69  11/10/17 118/69    Wt Readings from Last 3 Encounters:  12/03/17 176 lb (79.8 kg)  11/10/17 171 lb 12.8 oz (77.9 kg)  10/23/17 173 lb (78.5 kg)    Physical Exam  Constitutional: He is oriented to person, place, and time. He appears well-developed. No distress.  NAD  HENT:  Mouth/Throat: Oropharynx is clear and moist.  Eyes: Conjunctivae are normal. Pupils are  equal, round, and reactive to light.  Neck: Normal range of motion. No JVD present. No thyromegaly present.  Cardiovascular: Normal rate, regular rhythm, normal heart sounds and intact distal pulses. Exam reveals no gallop and no friction rub.  No murmur heard. Pulmonary/Chest: Effort normal and breath sounds normal. No respiratory distress. He has no wheezes. He has no rales. He exhibits no tenderness.  Abdominal: Soft. Bowel sounds are normal. He exhibits no distension and no mass. There is no tenderness.  There is no rebound and no guarding.  Musculoskeletal: Normal range of motion. He exhibits no edema or tenderness.  Lymphadenopathy:    He has no cervical adenopathy.  Neurological: He is alert and oriented to person, place, and time. He has normal reflexes. No cranial nerve deficit. He exhibits normal muscle tone. He displays a negative Romberg sign. Coordination abnormal. Gait normal.  Skin: Skin is warm and dry. No rash noted.  Psychiatric: He has a normal mood and affect. His behavior is normal. Judgment and thought content normal.  R hemiparesis walker  Lab Results  Component Value Date   WBC 8.6 11/27/2017   HGB 14.3 11/27/2017   HCT 42.0 11/27/2017   PLT 279 11/27/2017   GLUCOSE 92 11/27/2017   CHOL 131 09/02/2017   TRIG 73 09/02/2017   HDL 35 (L) 09/02/2017   LDLCALC 81 09/02/2017   ALT 19 11/27/2017   AST 21 11/27/2017   NA 139 11/27/2017   K 4.2 11/27/2017   CL 103 11/27/2017   CREATININE 0.60 (L) 11/27/2017   BUN 17 11/27/2017   CO2 25 11/27/2017   TSH 1.753 09/02/2017   PSA 1.25 07/01/2017   INR 1.02 11/27/2017   HGBA1C 5.9 (H) 09/02/2017    Ct Head Wo Contrast  Result Date: 11/27/2017 CLINICAL DATA:  Episode of numbness to LEFT hand lasting 5 minutes yesterday, today with numbness in LEFT hand, LEFT arm, LEFT ear and jaw lasting 10 minutes which has since resolved, history of stroke in October 2018, coronary artery disease, hypertension, hyperlipidemia, former smoker EXAM: CT HEAD WITHOUT CONTRAST TECHNIQUE: Contiguous axial images were obtained from the base of the skull through the vertex without intravenous contrast. Sagittal and coronal MPR images reconstructed from axial data set. COMPARISON:  10/23/2017 ; interval MR brain 11/21/2017 FINDINGS: Brain: Generalized atrophy. Normal ventricular morphology. No midline shift or mass effect. Small vessel chronic ischemic changes of deep cerebral white matter. Again identified area of infarction at the high LEFT parietal  region with interval resolution of the high attenuation hemorrhagic focus seen on the previous exam. New area of low attenuation at the LEFT occipital lobe since the prior CT corresponding to hemorrhagic infarct identified on interval MR. No additional intracranial hemorrhage, mass lesion, evidence of acute infarction, or extra-axial fluid collection. Vascular: No hyperdense vessels. Mild atherosclerotic calcification of internal carotid arteries at skullbase Skull: Intact Sinuses/Orbits: Minimal fluid or mucus dependently in sphenoid sinus. Minimal mucosal thickening or small mucosal retention cyst in a RIGHT ethmoid air cell. Remaining visualized paranasal sinuses and mastoid air cells clear. Nasal septal deviation to the RIGHT. Other: N/A IMPRESSION: Atrophy with small vessel chronic ischemic changes of deep cerebral white matter. Evolving high LEFT posterior parietal infarct with resolution of the high attenuation hematoma component since previous study. Evolving hemorrhagic infarct at LEFT occipital lobe. No new intracranial abnormalities. Electronically Signed   By: Ulyses Southward M.D.   On: 11/27/2017 19:31    Assessment & Plan:   There are no diagnoses linked  to this encounter. I am having Wai A. Azbill maintain his albuterol, budesonide-formoterol, nitroGLYCERIN, b complex vitamins, Vitamin D3, irbesartan, diclofenac sodium, baclofen, divalproex, and metoprolol tartrate.  No orders of the defined types were placed in this encounter.    Follow-up: No Follow-up on file.  Sonda PrimesAlex Plotnikov, MD

## 2017-12-03 NOTE — Assessment & Plan Note (Signed)
F/u Dr Pearlean BrownieSethi

## 2017-12-03 NOTE — Assessment & Plan Note (Signed)
irbesartan

## 2017-12-06 ENCOUNTER — Other Ambulatory Visit: Payer: Self-pay

## 2017-12-06 ENCOUNTER — Emergency Department (HOSPITAL_COMMUNITY): Payer: Medicare HMO

## 2017-12-06 ENCOUNTER — Encounter (HOSPITAL_COMMUNITY): Payer: Self-pay

## 2017-12-06 ENCOUNTER — Ambulatory Visit: Payer: Medicare HMO | Admitting: Physical Therapy

## 2017-12-06 ENCOUNTER — Encounter: Payer: Medicare HMO | Admitting: Occupational Therapy

## 2017-12-06 ENCOUNTER — Inpatient Hospital Stay (HOSPITAL_COMMUNITY)
Admission: EM | Admit: 2017-12-06 | Discharge: 2017-12-10 | DRG: 065 | Disposition: A | Discharge status: Rehabilitation Facility | Payer: Medicare HMO | Attending: Neurology | Admitting: Neurology

## 2017-12-06 DIAGNOSIS — Z8249 Family history of ischemic heart disease and other diseases of the circulatory system: Secondary | ICD-10-CM

## 2017-12-06 DIAGNOSIS — I69351 Hemiplegia and hemiparesis following cerebral infarction affecting right dominant side: Secondary | ICD-10-CM | POA: Diagnosis not present

## 2017-12-06 DIAGNOSIS — I69151 Hemiplegia and hemiparesis following nontraumatic intracerebral hemorrhage affecting right dominant side: Secondary | ICD-10-CM | POA: Diagnosis not present

## 2017-12-06 DIAGNOSIS — Z823 Family history of stroke: Secondary | ICD-10-CM

## 2017-12-06 DIAGNOSIS — I459 Conduction disorder, unspecified: Secondary | ICD-10-CM | POA: Diagnosis present

## 2017-12-06 DIAGNOSIS — I629 Nontraumatic intracranial hemorrhage, unspecified: Secondary | ICD-10-CM

## 2017-12-06 DIAGNOSIS — R200 Anesthesia of skin: Secondary | ICD-10-CM | POA: Diagnosis present

## 2017-12-06 DIAGNOSIS — R29706 NIHSS score 6: Secondary | ICD-10-CM | POA: Diagnosis present

## 2017-12-06 DIAGNOSIS — G40909 Epilepsy, unspecified, not intractable, without status epilepticus: Secondary | ICD-10-CM | POA: Diagnosis present

## 2017-12-06 DIAGNOSIS — M7582 Other shoulder lesions, left shoulder: Secondary | ICD-10-CM | POA: Diagnosis not present

## 2017-12-06 DIAGNOSIS — R4020 Unspecified coma: Secondary | ICD-10-CM | POA: Diagnosis not present

## 2017-12-06 DIAGNOSIS — Z882 Allergy status to sulfonamides status: Secondary | ICD-10-CM | POA: Diagnosis not present

## 2017-12-06 DIAGNOSIS — I680 Cerebral amyloid angiopathy: Secondary | ICD-10-CM | POA: Diagnosis not present

## 2017-12-06 DIAGNOSIS — M1711 Unilateral primary osteoarthritis, right knee: Secondary | ICD-10-CM | POA: Diagnosis not present

## 2017-12-06 DIAGNOSIS — I619 Nontraumatic intracerebral hemorrhage, unspecified: Secondary | ICD-10-CM | POA: Diagnosis not present

## 2017-12-06 DIAGNOSIS — R569 Unspecified convulsions: Secondary | ICD-10-CM

## 2017-12-06 DIAGNOSIS — Z7951 Long term (current) use of inhaled steroids: Secondary | ICD-10-CM | POA: Diagnosis not present

## 2017-12-06 DIAGNOSIS — Z951 Presence of aortocoronary bypass graft: Secondary | ICD-10-CM

## 2017-12-06 DIAGNOSIS — M25512 Pain in left shoulder: Secondary | ICD-10-CM | POA: Diagnosis not present

## 2017-12-06 DIAGNOSIS — R531 Weakness: Secondary | ICD-10-CM

## 2017-12-06 DIAGNOSIS — G40109 Localization-related (focal) (partial) symptomatic epilepsy and epileptic syndromes with simple partial seizures, not intractable, without status epilepticus: Secondary | ICD-10-CM | POA: Diagnosis not present

## 2017-12-06 DIAGNOSIS — Z87891 Personal history of nicotine dependence: Secondary | ICD-10-CM | POA: Diagnosis not present

## 2017-12-06 DIAGNOSIS — R9401 Abnormal electroencephalogram [EEG]: Secondary | ICD-10-CM | POA: Diagnosis present

## 2017-12-06 DIAGNOSIS — Z79899 Other long term (current) drug therapy: Secondary | ICD-10-CM

## 2017-12-06 DIAGNOSIS — R414 Neurologic neglect syndrome: Secondary | ICD-10-CM | POA: Diagnosis not present

## 2017-12-06 DIAGNOSIS — G8194 Hemiplegia, unspecified affecting left nondominant side: Secondary | ICD-10-CM | POA: Diagnosis present

## 2017-12-06 DIAGNOSIS — I610 Nontraumatic intracerebral hemorrhage in hemisphere, subcortical: Secondary | ICD-10-CM | POA: Diagnosis not present

## 2017-12-06 DIAGNOSIS — Z8679 Personal history of other diseases of the circulatory system: Secondary | ICD-10-CM | POA: Diagnosis not present

## 2017-12-06 DIAGNOSIS — M6289 Other specified disorders of muscle: Secondary | ICD-10-CM | POA: Diagnosis not present

## 2017-12-06 DIAGNOSIS — I10 Essential (primary) hypertension: Secondary | ICD-10-CM | POA: Diagnosis not present

## 2017-12-06 DIAGNOSIS — D72829 Elevated white blood cell count, unspecified: Secondary | ICD-10-CM | POA: Diagnosis not present

## 2017-12-06 DIAGNOSIS — E785 Hyperlipidemia, unspecified: Secondary | ICD-10-CM | POA: Diagnosis not present

## 2017-12-06 DIAGNOSIS — I611 Nontraumatic intracerebral hemorrhage in hemisphere, cortical: Secondary | ICD-10-CM | POA: Diagnosis not present

## 2017-12-06 DIAGNOSIS — I2510 Atherosclerotic heart disease of native coronary artery without angina pectoris: Secondary | ICD-10-CM | POA: Diagnosis present

## 2017-12-06 DIAGNOSIS — I609 Nontraumatic subarachnoid hemorrhage, unspecified: Secondary | ICD-10-CM | POA: Diagnosis present

## 2017-12-06 DIAGNOSIS — I639 Cerebral infarction, unspecified: Secondary | ICD-10-CM | POA: Diagnosis not present

## 2017-12-06 DIAGNOSIS — E854 Organ-limited amyloidosis: Secondary | ICD-10-CM | POA: Diagnosis present

## 2017-12-06 LAB — PROTIME-INR
INR: 0.98
PROTHROMBIN TIME: 12.9 s (ref 11.4–15.2)

## 2017-12-06 LAB — COMPREHENSIVE METABOLIC PANEL
ALT: 17 U/L (ref 17–63)
ANION GAP: 13 (ref 5–15)
AST: 19 U/L (ref 15–41)
Albumin: 3.8 g/dL (ref 3.5–5.0)
Alkaline Phosphatase: 59 U/L (ref 38–126)
BUN: 15 mg/dL (ref 6–20)
CHLORIDE: 102 mmol/L (ref 101–111)
CO2: 23 mmol/L (ref 22–32)
Calcium: 9.8 mg/dL (ref 8.9–10.3)
Creatinine, Ser: 0.82 mg/dL (ref 0.61–1.24)
GFR calc Af Amer: >60 mL/min (ref >60)
Glucose, Bld: 125 mg/dL — ABNORMAL HIGH (ref 65–99)
POTASSIUM: 4.3 mmol/L (ref 3.5–5.1)
Sodium: 138 mmol/L (ref 135–145)
Total Bilirubin: 1.1 mg/dL (ref 0.3–1.2)
Total Protein: 7.3 g/dL (ref 6.5–8.1)

## 2017-12-06 LAB — DIFFERENTIAL
BASOS PCT: 1 %
Basophils Absolute: 0.1 10*3/uL (ref 0.0–0.1)
EOS ABS: 0 10*3/uL (ref 0.0–0.7)
Eosinophils Relative: 0 %
LYMPHS ABS: 2 10*3/uL (ref 0.7–4.0)
Lymphocytes Relative: 17 %
MONO ABS: 0.8 10*3/uL (ref 0.1–1.0)
Monocytes Relative: 7 %
NEUTROS ABS: 8.7 10*3/uL — AB (ref 1.7–7.7)
Neutrophils Relative %: 75 %

## 2017-12-06 LAB — I-STAT CHEM 8, ED
BUN: 18 mg/dL (ref 6–20)
Calcium, Ion: 1.22 mmol/L (ref 1.15–1.40)
Chloride: 100 mmol/L — ABNORMAL LOW (ref 101–111)
Creatinine, Ser: 0.7 mg/dL (ref 0.61–1.24)
Glucose, Bld: 128 mg/dL — ABNORMAL HIGH (ref 65–99)
HEMATOCRIT: 50 % (ref 39.0–52.0)
Hemoglobin: 17 g/dL (ref 13.0–17.0)
Potassium: 4.3 mmol/L (ref 3.5–5.1)
SODIUM: 141 mmol/L (ref 135–145)
TCO2: 26 mmol/L (ref 22–32)

## 2017-12-06 LAB — I-STAT TROPONIN, ED: TROPONIN I, POC: 0 ng/mL (ref 0.00–0.08)

## 2017-12-06 LAB — CBC
HCT: 47.1 % (ref 39.0–52.0)
Hemoglobin: 16 g/dL (ref 13.0–17.0)
MCH: 31.4 pg (ref 26.0–34.0)
MCHC: 34 g/dL (ref 30.0–36.0)
MCV: 92.4 fL (ref 78.0–100.0)
PLATELETS: 307 10*3/uL (ref 150–400)
RBC: 5.1 MIL/uL (ref 4.22–5.81)
RDW: 14.9 % (ref 11.5–15.5)
WBC: 11.6 10*3/uL — AB (ref 4.0–10.5)

## 2017-12-06 LAB — RAPID URINE DRUG SCREEN, HOSP PERFORMED
Amphetamines: NOT DETECTED
BARBITURATES: NOT DETECTED
Benzodiazepines: NOT DETECTED
COCAINE: NOT DETECTED
OPIATES: NOT DETECTED
Tetrahydrocannabinol: NOT DETECTED

## 2017-12-06 LAB — URINALYSIS, ROUTINE W REFLEX MICROSCOPIC
Bilirubin Urine: NEGATIVE
GLUCOSE, UA: NEGATIVE mg/dL
Hgb urine dipstick: NEGATIVE
KETONES UR: NEGATIVE mg/dL
LEUKOCYTES UA: NEGATIVE
Nitrite: NEGATIVE
PH: 7 (ref 5.0–8.0)
PROTEIN: NEGATIVE mg/dL
Specific Gravity, Urine: 1.018 (ref 1.005–1.030)

## 2017-12-06 LAB — APTT: aPTT: 32 seconds (ref 24–36)

## 2017-12-06 LAB — CBG MONITORING, ED: Glucose-Capillary: 99 mg/dL (ref 65–99)

## 2017-12-06 LAB — ETHANOL: Alcohol, Ethyl (B): <10 mg/dL (ref <10)

## 2017-12-06 MED ORDER — PANTOPRAZOLE SODIUM 40 MG IV SOLR
40.0000 mg | Freq: Every day | INTRAVENOUS | Status: DC
  Administered 2017-12-06: 40 mg via INTRAVENOUS
  Filled 2017-12-06: qty 40

## 2017-12-06 MED ORDER — NICARDIPINE HCL IN NACL 20-0.86 MG/200ML-% IV SOLN: 0.0000 mg/h | INTRAVENOUS | Status: DC

## 2017-12-06 MED ORDER — DIVALPROEX SODIUM 500 MG PO DR TAB
500.0000 mg | DELAYED_RELEASE_TABLET | Freq: Every day | ORAL | Status: DC
  Administered 2017-12-06: 500 mg via ORAL
  Filled 2017-12-06: qty 1
  Filled 2017-12-06: qty 2

## 2017-12-06 MED ORDER — BACLOFEN 10 MG PO TABS
20.0000 mg | ORAL_TABLET | Freq: Three times a day (TID) | ORAL | Status: DC
  Administered 2017-12-06 – 2017-12-10 (×12): 20 mg via ORAL
  Filled 2017-12-06 (×2): qty 2
  Filled 2017-12-06: qty 1
  Filled 2017-12-06: qty 2
  Filled 2017-12-06: qty 1
  Filled 2017-12-06 (×8): qty 2

## 2017-12-06 MED ORDER — ACETAMINOPHEN 650 MG RE SUPP: 650.0000 mg | RECTAL | Status: DC | PRN

## 2017-12-06 MED ORDER — ACETAMINOPHEN 500 MG PO TABS
500.0000 mg | ORAL_TABLET | Freq: Four times a day (QID) | ORAL | Status: DC | PRN
  Administered 2017-12-06: 500 mg via ORAL
  Filled 2017-12-06 (×2): qty 1

## 2017-12-06 MED ORDER — STROKE: EARLY STAGES OF RECOVERY BOOK
Freq: Once | Status: DC
  Filled 2017-12-06: qty 1

## 2017-12-06 MED ORDER — ACETAMINOPHEN 325 MG PO TABS
650.0000 mg | ORAL_TABLET | ORAL | Status: DC | PRN
  Administered 2017-12-08: 650 mg via ORAL
  Filled 2017-12-06: qty 2

## 2017-12-06 MED ORDER — ACETAMINOPHEN 160 MG/5ML PO SOLN: 650.0000 mg | ORAL | Status: DC | PRN

## 2017-12-06 MED ORDER — SODIUM CHLORIDE 0.9 % IV SOLN
INTRAVENOUS | Status: DC
  Administered 2017-12-06: via INTRAVENOUS

## 2017-12-06 MED ORDER — SENNOSIDES-DOCUSATE SODIUM 8.6-50 MG PO TABS
1.0000 | ORAL_TABLET | Freq: Two times a day (BID) | ORAL | Status: DC
  Administered 2017-12-08 (×2): 1 via ORAL
  Filled 2017-12-06 (×6): qty 1

## 2017-12-06 MED ORDER — LABETALOL HCL 5 MG/ML IV SOLN: 20.0000 mg | Freq: Once | INTRAVENOUS | Status: DC

## 2017-12-06 NOTE — H&P (Signed)
Requesting Physician: Dr. Sherri SearJacubawitz    Chief Complaint: Recurrent intracranial hemorrhage  History obtained from:  Patient     HPI:                                                                                                                                         Jaime Pierce is an 72 y.o. male who was admitted to the hospital back in October for a left parietal intracranial hemorrhage.  Post hospitalization patient did have to go to inpatient rehab and then was followed with outpatient rehab for improvement of right arm and leg strength/mobility. Family states that he had improved significantly regarding his left sided deficits from the October hemorrhage. However, yesterday at approximately 3 PM patient was watching TV when he had a sudden onset of left arm heaviness and tingling.  For that reason and knowing he has had a previous stroke he was brought to the emergency department today.  CT the head shows multiple hemorrhages in the right hemisphere including the temporal region, parietal region, and also in the cortices.  Patient has not had any seizures while in the hospital.  He still is weak in the left arm mostly distally and with impairment of fine motor activity.  Patient is not on an antiplatelet medication or anticoagulant at this time and blood pressure is well controlled approximately 110-120 systolically  Date last known well: Date: 12/05/2017 Time last known well: Time: 15:00 tPA Given: No: Hemorrhagic strokes NIHSS 6 Modified Rankin: Rankin Score=2      Past Medical History:  Diagnosis Date  . Coronary artery disease   . Dyspnea on exertion   . Hemorrhagic stroke (HCC)   . Hyperlipidemia   . Hypertension   . Seizures (HCC)   . Stroke (HCC)   . Tobacco abuse          Past Surgical History:  Procedure Laterality Date  . CORONARY ARTERY BYPASS GRAFT           Family History  Problem Relation Age of Onset  . Heart disease Father   . CVA  Mother   . Heart disease Maternal Grandfather    Social History:  reports that he quit smoking about a year ago. His smoking use included cigarettes. he has never used smokeless tobacco. He reports that he drinks alcohol. He reports that he does not use drugs.  Allergies:      Allergies  Allergen Reactions  . Sulfonamide Derivatives     Medications:  No current facility-administered medications for this encounter.          Current Outpatient Medications  Medication Sig Dispense Refill  . acetaminophen (TYLENOL) 325 MG tablet Take by mouth every 4 (four) hours as needed.    [name] Kitchen albuterol (PROAIR HFA) 108 (90 Base) MCG/ACT inhaler 2 puffs up to every 4 hours if can't catch your breath (Patient taking differently: Inhale 1-2 puffs into the lungs every 4 (four) hours as needed for wheezing or shortness of breath. ) 1 Inhaler 11  . b complex vitamins tablet Take 1 tablet by mouth daily. 100 tablet 3  . baclofen (LIORESAL) 20 MG tablet Take 1 tablet (20 mg total) by mouth 3 (three) times daily. 90 each 4  . budesonide-formoterol (SYMBICORT) 160-4.5 MCG/ACT inhaler Take 2 puffs first thing in am and then another 2 puffs about 12 hours later. (Patient taking differently: Inhale 2 puffs into the lungs 2 (two) times daily. ) 1 Inhaler 0  . Cholecalciferol (VITAMIN D3) 2000 units capsule Take 1 capsule (2,000 Units total) by mouth daily. 100 capsule 3  . diclofenac sodium (VOLTAREN) 1 % GEL Apply 2 g topically 4 (four) times daily as needed (Pain). 3 Tube 4  . divalproex (DEPAKOTE) 500 MG DR tablet Take 1 tablet (500 mg total) by mouth daily. 30 tablet 2  . irbesartan (AVAPRO) 75 MG tablet Take 1 tablet (75 mg total) by mouth daily. 90 tablet 3  . metoprolol tartrate (LOPRESSOR) 25 MG tablet Take 0.5 tablets (12.5 mg total) by mouth 2 (two) times daily. 90 tablet 1  .  nitroGLYCERIN (NITROSTAT) 0.4 MG SL tablet Place 1 tablet (0.4 mg total) under the tongue every 5 (five) minutes as needed for chest pain. 20 tablet 3  . oseltamivir (TAMIFLU) 75 MG capsule Take 1 capsule (75 mg total) by mouth 2 (two) times daily. 10 capsule 0     ROS:                                                                                                                                       History obtained from the patient and family  General ROS: negative for - chills, fatigue, fever, night sweats, weight gain or weight loss Psychological ROS: negative for - behavioral disorder, hallucinations, memory difficulties, mood swings or suicidal ideation Ophthalmic ROS: negative for - blurry vision, double vision, eye pain or loss of vision ENT ROS: negative for - epistaxis, nasal discharge, oral lesions, sore throat, tinnitus or vertigo Allergy and Immunology ROS: negative for - hives or itchy/watery eyes Hematological and Lymphatic ROS: negative for - bleeding problems, bruising or swollen lymph nodes Endocrine ROS: negative for - galactorrhea, hair pattern changes, polydipsia/polyuria or temperature intolerance Respiratory ROS: negative for - cough, hemoptysis, shortness of breath or wheezing Cardiovascular ROS: negative for - chest pain, dyspnea on exertion, edema or irregular heartbeat Gastrointestinal ROS: negative for - abdominal pain,  diarrhea, hematemesis, nausea/vomiting or stool incontinence Genito-Urinary ROS: negative for - dysuria, hematuria, incontinence or urinary frequency/urgency Musculoskeletal ROS: negative for - joint swelling or muscular weakness Neurological ROS: as noted in HPI Dermatological ROS: negative for rash and skin lesion changes  Neurologic Examination:                                                                                                      Blood pressure 116/71, pulse 74, temperature 98.1 F (36.7 C), temperature source Oral, resp.  rate (!) 21, height 6' (1.829 m), weight 79.8 kg (176 lb), SpO2 94 %.  HEENT-  Normocephalic, no lesions, without obvious abnormality.  Normal external eye and conjunctiva.  Normal TM's bilaterally.  Normal auditory canals and external ears. Normal external nose, mucus membranes and septum.  Normal pharynx. Cardiovascular- S1, S2 normal, pulses palpable throughout   Lungs- chest clear, no wheezing, rales, normal symmetric air entry Abdomen- normal findings: bowel sounds normal Extremities- no edema Lymph-no adenopathy palpable Musculoskeletal-no joint tenderness, deformity or swelling Skin-warm and dry, no hyperpigmentation, vitiligo, or suspicious lesions  Neurological Examination Mental Status: Alert, oriented, thought content appropriate.  Speech fluent without evidence of aphasia.  Able to follow 3 step commands without difficulty. Cranial Nerves: II: Visual fields grossly normal,  III,IV, VI: ptosis not present, extra-ocular motions intact bilaterally, pupils equal, round, reactive to light and accommodation V,VII: smile symmetric, facial light touch sensation normal bilaterally VIII: hearing normal bilaterally IX,X: uvula rises symmetrically XI: bilateral shoulder shrug XII: midline tongue extension Motor: Right :  Upper extremity   5/5                                      Left:     Upper extremity   5/5 actually however distally in his hand it is a 4/5             Lower extremity   5/5                                                  Lower extremity   5/5 Right arm has increased tone and contracture at about 35 degrees.  When he does reach out such my finger there is notable postural tremor which becomes worse as he is approaching the object.  There is no tremor noted in the left arm. Sensory: Pinprick and light touch intact throughout, bilaterally--left arm less than right arm and right leg less than left leg Deep Tendon Reflexes: 2+ and symmetric throughout Plantars: Right:  downgoing                                Left: downgoing Cerebellar: Unable to do finger-nose with the right arm secondary to his tone in addition to tremor.  Mild dysmetria with  the left arm however I believe this is secondary to weakness.  No dysmetria with left heel over right shin.  Patient had difficulty doing right shin over left shin Gait: Not tested     Lab Results: Basic Metabolic Panel: [facility]      Recent Labs  Lab 12/06/17 0942 12/06/17 1002  NA 138 141  K 4.3 4.3  CL 102 100*  CO2 23  --   GLUCOSE 125* 128*  BUN 15 18  CREATININE 0.82 0.70  CALCIUM 9.8  --       Liver Function Tests: [facility]     Recent Labs  Lab 12/06/17 0942  AST 19  ALT 17  ALKPHOS 59  BILITOT 1.1  PROT 7.3  ALBUMIN 3.8     [facility]  No results for input(s): LIPASE, AMYLASE in the last 168 hours.   [facility]  No results for input(s): AMMONIA in the last 168 hours.    CBC: [facility]      Recent Labs  Lab 12/06/17 0942 12/06/17 1002  WBC 11.6*  --   NEUTROABS 8.7*  --   HGB 16.0 17.0  HCT 47.1 50.0  MCV 92.4  --   PLT 307  --       Cardiac Enzymes: [facility]  No results for input(s): CKTOTAL, CKMB, CKMBINDEX, TROPONINI in the last 168 hours.    Lipid Panel: [facility]  No results for input(s): CHOL, TRIG, HDL, CHOLHDL, VLDL, LDLCALC in the last 168 hours.    CBG: [facility]     Recent Labs  Lab 12/06/17 1419  GLUCAP 99      Microbiology:        Results for orders placed or performed during the hospital encounter of 09/01/17  MRSA PCR Screening     Status: None   Collection Time: 09/01/17  5:55 AM  Result Value Ref Range Status   MRSA by PCR NEGATIVE NEGATIVE Final    Comment:        The GeneXpert MRSA Assay (FDA approved for NASAL specimens only), is one component of a comprehensive MRSA colonization surveillance program. It is not intended to diagnose MRSA infection nor to guide or monitor treatment for MRSA  infections.     Coagulation Studies: [facility](last2labs)  Recent Labs    12/06/17 0942  LABPROT 12.9  INR 0.98      Imaging:  ImagingResults(Last48hours)  Ct Head Wo Contrast  Result Date: 12/06/2017 CLINICAL DATA:  Left arm and hand numbness beginning yesterday. Old stroke with right-sided weakness. EXAM: CT HEAD WITHOUT CONTRAST TECHNIQUE: Contiguous axial images were obtained from the base of the skull through the vertex without intravenous contrast. COMPARISON:  CT 11/27/2017. MRI 11/21/2017. multiple previous examinations 2,018. FINDINGS: Brain: No abnormality seen affecting the brainstem or cerebellum. In the left hemisphere, there is an old/subacute hemorrhagic insult in the occipital lobe which is undergoing expected evolutionary changes without evidence of new bleeding. Old hematoma in the left parietal cortical and subcortical region as seen previously, measuring about 3 cm in diameter, undergoing expected evolutionary changes. No evidence of enlargement or new bleeding. Mild chronic small-vessel ischemic changes elsewhere within the left hemispheric white matter. In the right hemisphere, there is a new intraparenchymal hemorrhage in the right inferior temporal lobe measuring approximately 2 x 2 x 1 cm, volume 2 cc. Mild surrounding edema suggests that this is subacute. Second new intraparenchymal hematoma in the peripheral right temporal lobe measuring approximately 2 x 2.5 by 2 cm, volume 5 cc. Small amount of  surrounding edema as well. Some adjacent subarachnoid blood in nearby sulci, probably secondary to penetration at this site. Small old right frontal cortical and subcortical infarction. Chronic small-vessel ischemic changes throughout the white matter. No evidence of hydrocephalus or intraventricular blood. Vascular: There is atherosclerotic calcification of the major vessels at the base of the brain. Skull: Negative Sinuses/Orbits: Clear/normal Other: None  significant IMPRESSION: Newly seen intraparenchymal hemorrhages in the right inferior temporal lobe and right parietal lobe, with some subarachnoid blood in the sulci near the right parietal hematoma. 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 No evidence of mass effect or shift. These results were called by telephone at the time of interpretation on 12/06/2017 at 10:54 am to Dr. Doug Sou , who verbally acknowledged these results. Electronically Signed   By: Paulina Fusi M.D.   On: 12/06/2017 11:00     History and examination documented by Felicie Morn PA-C, [facility], [phone] 12/06/2017, 2:34 PM   Assessment: 72 y.o. male with recurrent intracerebral hemorrhages 1. On CT there are newly seen intraparenchymal hemorrhages in the right inferior temporal lobe and right parietal lobe, with some subarachnoid blood in the sulci near the right parietal hematoma. 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 The former is relatively unlikely given that patient is not ill-appearing, WBCs only mildly elevated, no headache and is afebrile. The latter is also relatively unlikely as no headache or neck stiffness.  3. Seizure disorder  Plan: 1. Admit to ICU under Neurology service 2. Continue Depakote 3. TEE to assess for possible vegetation 4. No antiplatelet medications or anticoagulants. DVT prophylaxis with SCDs 5. Repeat CT head in 24 hours 6. Vasculitis panel - send out to [facility] labs 7. Stroke team to follow in AM.   Electronically signed: Dr. Caryl Pina

## 2017-12-06 NOTE — Consult Note (Deleted)
Requesting Physician: Dr. Sherri Sear    Chief Complaint:   History obtained from:  Patient     HPI:                                                                                                                                         Jaime Pierce is an 72 y.o. male who was admitted to the hospital back in October for a left parietal intracranial hemorrhage.  Post hospitalization patient did have to go to inpatient rehab and then was followed with outpatient rehab.  Apparently yesterday at approximately 3 PM patient was watching TV when he had a sudden onset of left arm heaviness and tingling.  For that reason and knowing he has had a previous stroke he was brought to the emergency department today.  CT the head shows multiple strokes on the right hemisphere including the temporal region, parietal region, and also in the cortices.  Patient has not had any seizures while in the hospital.  He still is weak on the left arm mostly distally and with fine motor skills.  Patient is not on aspirin at this time and blood pressure is well controlled approximately 110-120 systolically  Date last known well: Date: 12/05/2017 Time last known well: Time: 15:00 tPA Given: No: Out of the window NISS 6 Modified Rankin: Rankin Score=2 Total:  0  Past Medical History:  Diagnosis Date  . Coronary artery disease   . Dyspnea on exertion   . Hemorrhagic stroke (HCC)   . Hyperlipidemia   . Hypertension   . Seizures (HCC)   . Stroke (HCC)   . Tobacco abuse     Past Surgical History:  Procedure Laterality Date  . CORONARY ARTERY BYPASS GRAFT      Family History  Problem Relation Age of Onset  . Heart disease Father   . CVA Mother   . Heart disease Maternal Grandfather    Social History:  reports that he quit smoking about a year ago. His smoking use included cigarettes. he has never used smokeless tobacco. He reports that he drinks alcohol. He reports that he does not use drugs.  Allergies:   Allergies  Allergen Reactions  . Sulfonamide Derivatives     Medications:  No current facility-administered medications for this encounter.    Current Outpatient Medications  Medication Sig Dispense Refill  . acetaminophen (TYLENOL) 325 MG tablet Take by mouth every 4 (four) hours as needed.    [name] Kitchen albuterol (PROAIR HFA) 108 (90 Base) MCG/ACT inhaler 2 puffs up to every 4 hours if can't catch your breath (Patient taking differently: Inhale 1-2 puffs into the lungs every 4 (four) hours as needed for wheezing or shortness of breath. ) 1 Inhaler 11  . b complex vitamins tablet Take 1 tablet by mouth daily. 100 tablet 3  . baclofen (LIORESAL) 20 MG tablet Take 1 tablet (20 mg total) by mouth 3 (three) times daily. 90 each 4  . budesonide-formoterol (SYMBICORT) 160-4.5 MCG/ACT inhaler Take 2 puffs first thing in am and then another 2 puffs about 12 hours later. (Patient taking differently: Inhale 2 puffs into the lungs 2 (two) times daily. ) 1 Inhaler 0  . Cholecalciferol (VITAMIN D3) 2000 units capsule Take 1 capsule (2,000 Units total) by mouth daily. 100 capsule 3  . diclofenac sodium (VOLTAREN) 1 % GEL Apply 2 g topically 4 (four) times daily as needed (Pain). 3 Tube 4  . divalproex (DEPAKOTE) 500 MG DR tablet Take 1 tablet (500 mg total) by mouth daily. 30 tablet 2  . irbesartan (AVAPRO) 75 MG tablet Take 1 tablet (75 mg total) by mouth daily. 90 tablet 3  . metoprolol tartrate (LOPRESSOR) 25 MG tablet Take 0.5 tablets (12.5 mg total) by mouth 2 (two) times daily. 90 tablet 1  . nitroGLYCERIN (NITROSTAT) 0.4 MG SL tablet Place 1 tablet (0.4 mg total) under the tongue every 5 (five) minutes as needed for chest pain. 20 tablet 3  . oseltamivir (TAMIFLU) 75 MG capsule Take 1 capsule (75 mg total) by mouth 2 (two) times daily. 10 capsule 0     ROS:                                                                                                                                        History obtained from the patient and And family  General ROS: negative for - chills, fatigue, fever, night sweats, weight gain or weight loss Psychological ROS: negative for - behavioral disorder, hallucinations, memory difficulties, mood swings or suicidal ideation Ophthalmic ROS: negative for - blurry vision, double vision, eye pain or loss of vision ENT ROS: negative for - epistaxis, nasal discharge, oral lesions, sore throat, tinnitus or vertigo Allergy and Immunology ROS: negative for - hives or itchy/watery eyes Hematological and Lymphatic ROS: negative for - bleeding problems, bruising or swollen lymph nodes Endocrine ROS: negative for - galactorrhea, hair pattern changes, polydipsia/polyuria or temperature intolerance Respiratory ROS: negative for - cough, hemoptysis, shortness of breath or wheezing Cardiovascular ROS: negative for - chest pain, dyspnea on exertion, edema or irregular heartbeat Gastrointestinal ROS: negative for - abdominal pain, diarrhea, hematemesis, nausea/vomiting or stool  incontinence Genito-Urinary ROS: negative for - dysuria, hematuria, incontinence or urinary frequency/urgency Musculoskeletal ROS: negative for - joint swelling or muscular weakness Neurological ROS: as noted in HPI Dermatological ROS: negative for rash and skin lesion changes  Neurologic Examination:                                                                                                      Blood pressure 116/71, pulse 74, temperature 98.1 F (36.7 C), temperature source Oral, resp. rate (!) 21, height 6' (1.829 m), weight 79.8 kg (176 lb), SpO2 94 %.  HEENT-  Normocephalic, no lesions, without obvious abnormality.  Normal external eye and conjunctiva.  Normal TM's bilaterally.  Normal auditory canals and external ears. Normal external nose, mucus membranes and septum.  Normal  pharynx. Cardiovascular- S1, S2 normal, pulses palpable throughout   Lungs- chest clear, no wheezing, rales, normal symmetric air entry Abdomen- normal findings: bowel sounds normal Extremities- no edema Lymph-no adenopathy palpable Musculoskeletal-no joint tenderness, deformity or swelling Skin-warm and dry, no hyperpigmentation, vitiligo, or suspicious lesions  Neurological Examination Mental Status: Alert, oriented, thought content appropriate.  Speech fluent without evidence of aphasia.  Able to follow 3 step commands without difficulty. Cranial Nerves: II: Visual fields grossly normal,  III,IV, VI: ptosis not present, extra-ocular motions intact bilaterally, pupils equal, round, reactive to light and accommodation V,VII: smile symmetric, facial light touch sensation normal bilaterally VIII: hearing normal bilaterally IX,X: uvula rises symmetrically XI: bilateral shoulder shrug XII: midline tongue extension Motor: Right : Upper extremity   5/5    Left:     Upper extremity   5/5 actually however distally in his hand it is a 4/5  Lower extremity   5/5     Lower extremity   5/5 Right arm has increased tone and contracture at about 35 degrees.  When he does reach out such my finger there is notable postural tremor which becomes worse as he is approaching the object.  There is no tremor noted in the left arm. Sensory: Pinprick and light touch intact throughout, bilaterally--left arm less than right arm and right leg less than left leg Deep Tendon Reflexes: 2+ and symmetric throughout Plantars: Right: downgoing   Left: downgoing Cerebellar: Unable to do finger-nose with the right arm secondary to his tone in addition to tremor.  Mild dysmetria with the left arm however I believe this is secondary to weakness.  No dysmetria with left heel over right shin.  Patient had difficulty doing right shin over left shin Gait: Not tested     Lab Results: Basic Metabolic Panel: Recent Labs   Lab 12/06/17 0942 12/06/17 1002  NA 138 141  K 4.3 4.3  CL 102 100*  CO2 23  --   GLUCOSE 125* 128*  BUN 15 18  CREATININE 0.82 0.70  CALCIUM 9.8  --     Liver Function Tests: Recent Labs  Lab 12/06/17 0942  AST 19  ALT 17  ALKPHOS 59  BILITOT 1.1  PROT 7.3  ALBUMIN 3.8   No results for input(s): LIPASE, AMYLASE in  the last 168 hours. No results for input(s): AMMONIA in the last 168 hours.  CBC: Recent Labs  Lab 12/06/17 0942 12/06/17 1002  WBC 11.6*  --   NEUTROABS 8.7*  --   HGB 16.0 17.0  HCT 47.1 50.0  MCV 92.4  --   PLT 307  --     Cardiac Enzymes: No results for input(s): CKTOTAL, CKMB, CKMBINDEX, TROPONINI in the last 168 hours.  Lipid Panel: No results for input(s): CHOL, TRIG, HDL, CHOLHDL, VLDL, LDLCALC in the last 168 hours.  CBG: Recent Labs  Lab 12/06/17 1419  GLUCAP 99    Microbiology: Results for orders placed or performed during the hospital encounter of 09/01/17  MRSA PCR Screening     Status: None   Collection Time: 09/01/17  5:55 AM  Result Value Ref Range Status   MRSA by PCR NEGATIVE NEGATIVE Final    Comment:        The GeneXpert MRSA Assay (FDA approved for NASAL specimens only), is one component of a comprehensive MRSA colonization surveillance program. It is not intended to diagnose MRSA infection nor to guide or monitor treatment for MRSA infections.     Coagulation Studies: Recent Labs    12/06/17 0942  LABPROT 12.9  INR 0.98    Imaging: Ct Head Wo Contrast  Result Date: 12/06/2017 CLINICAL DATA:  Left arm and hand numbness beginning yesterday. Old stroke with right-sided weakness. EXAM: CT HEAD WITHOUT CONTRAST TECHNIQUE: Contiguous axial images were obtained from the base of the skull through the vertex without intravenous contrast. COMPARISON:  CT 11/27/2017. MRI 11/21/2017. multiple previous examinations 2,018. FINDINGS: Brain: No abnormality seen affecting the brainstem or cerebellum. In the left  hemisphere, there is an old/subacute hemorrhagic insult in the occipital lobe which is undergoing expected evolutionary changes without evidence of new bleeding. Old hematoma in the left parietal cortical and subcortical region as seen previously, measuring about 3 cm in diameter, undergoing expected evolutionary changes. No evidence of enlargement or new bleeding. Mild chronic small-vessel ischemic changes elsewhere within the left hemispheric white matter. In the right hemisphere, there is a new intraparenchymal hemorrhage in the right inferior temporal lobe measuring approximately 2 x 2 x 1 cm, volume 2 cc. Mild surrounding edema suggests that this is subacute. Second new intraparenchymal hematoma in the peripheral right temporal lobe measuring approximately 2 x 2.5 by 2 cm, volume 5 cc. Small amount of surrounding edema as well. Some adjacent subarachnoid blood in nearby sulci, probably secondary to penetration at this site. Small old right frontal cortical and subcortical infarction. Chronic small-vessel ischemic changes throughout the white matter. No evidence of hydrocephalus or intraventricular blood. Vascular: There is atherosclerotic calcification of the major vessels at the base of the brain. Skull: Negative Sinuses/Orbits: Clear/normal Other: None significant IMPRESSION: Newly seen intraparenchymal hemorrhages in the right inferior temporal lobe and right parietal lobe, with some subarachnoid blood in the sulci near the right parietal hematoma. 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 No evidence of mass effect or shift. These results were called by telephone at the time of interpretation on 12/06/2017 at 10:54 am to Dr. Doug SouSAM JACUBOWITZ , who verbally acknowledged these results. Electronically Signed   By: Paulina FusiMark  Shogry M.D.   On: 12/06/2017 11:00        Assessment and plan discussed with with attending physician and they  are in agreement.    Felicie Morn PA-C [facility] [phone]  12/06/2017, 2:34 PM   Assessment: 72 y.o. male   Stroke Risk Factors - none

## 2017-12-06 NOTE — ED Provider Notes (Signed)
Jaime Pierce Provider Note   CSN: [id number] Arrival date & time: 12/06/17  [id number]     History   Chief Complaint Chief Complaint  Patient presents with  . Numbness    HPI Jaime Pierce is a 72 y.o. male.  Patient with several previous interparenchymal hematomas, likely spontaneous, first noted in 08/2017, off all anticoagulation and antiplatelets, on medications to control blood pressure, on Depakote --presents with new onset of persistent left-sided clumsiness, left arm weakness, and numbness that has been constant since yesterday afternoon.  Patient can not give an exact time, but it has been greater than 12 hours.  Patient complains of a mild headache.  No left leg symptoms.  He has prior right leg and arm weakness that is unchanged from previous.  No fevers or neck pain.  No chest pain or shortness of breath.  No nausea, vomiting, diarrhea or abdominal pain.  Patient was seen in the emergency [facility] on 11/27/2017 for transient numbness that resolved.  At that time no new bleeding noted on CT.  The onset of this condition was acute. The course is constant. Aggravating factors: none. Alleviating factors: none.        Past Medical History:  Diagnosis Date  . Coronary artery disease   . Dyspnea on exertion   . Hemorrhagic stroke (HCC)   . Hyperlipidemia   . Hypertension   . Seizures (HCC)   . Stroke (HCC)   . Tobacco abuse     Patient Active Problem List   Diagnosis Date Noted  . Partial seizure (HCC) 11/08/2017  . Positive colorectal cancer screening using Cologuard test 10/22/2017  . Spastic hemiparesis (HCC)   . Cough   . 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 Pain   . Hyponatremia   . Benign essential HTN   . 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 Gait disturbance, post-stroke   . Aphasia as late effect of stroke   . SAH (subarachnoid  hemorrhage) (HCC) 09/02/2017  . B12 deficiency 09/02/2017  . Well adult exam 06/16/2016  . Cigarette smoker 02/23/2016  . COPD GOLD II  12/24/2014  . CAD (coronary artery disease) 07/22/2011  . HTN (hypertension) 07/22/2011  . Dyslipidemia 07/22/2011  . Smoker 07/22/2011    Past Surgical History:  Procedure Laterality Date  . CORONARY ARTERY BYPASS GRAFT         Home Medications    Prior to Admission medications   Medication Sig Start Date End Date Taking? Authorizing Provider  albuterol (PROAIR HFA) 108 (90 Base) MCG/ACT inhaler 2 puffs up to every 4 hours if can't catch your breath Patient taking differently: Inhale 1-2 puffs into the lungs every 4 (four) hours as needed for wheezing or shortness of breath.  03/25/17   Nyoka CowdenWert, Michael B, MD  b complex vitamins tablet Take 1 tablet by mouth daily. 10/22/17   Plotnikov, Georgina QuintAleksei V, MD  baclofen (LIORESAL) 20 MG tablet Take 1 tablet (20 mg total) by mouth 3 (three) times daily. 11/08/17   Ranelle OysterSwartz, Zachary T, MD  budesonide-formoterol [facility](SYMBICORT) 160-4.5 MCG/ACT inhaler Take 2 puffs first thing in am and then another 2 puffs about 12 hours later. Patient taking differently: Inhale 2 puffs into the lungs 2 (two) times daily.  03/25/17   Nyoka CowdenWert, Michael B, MD  Cholecalciferol (VITAMIN D3) 2000 units capsule Take 1 capsule (2,000 Units total) by mouth daily. 10/22/17   Plotnikov,  Georgina Quint, MD  diclofenac sodium (VOLTAREN) 1 % GEL Apply 2 g topically 4 (four) times daily as needed (Pain). 11/08/17   Ranelle Oyster, MD  divalproex (DEPAKOTE) 500 MG DR tablet Take 1 tablet (500 mg total) by mouth daily. 11/10/17   Micki Riley, MD  irbesartan (AVAPRO) 75 MG tablet Take 1 tablet (75 mg total) by mouth daily. 10/22/17   Plotnikov, Georgina Quint, MD  metoprolol tartrate (LOPRESSOR) 25 MG tablet Take 0.5 tablets (12.5 mg total) by mouth 2 (two) times daily. 11/15/17   Plotnikov, Georgina Quint, MD  nitroGLYCERIN (NITROSTAT) 0.4 MG SL tablet Place 1 tablet  (0.4 mg total) under the tongue every 5 (five) minutes as needed for chest pain. 06/30/17 10/24/21  Plotnikov, Georgina Quint, MD  oseltamivir (TAMIFLU) 75 MG capsule Take 1 capsule (75 mg total) by mouth 2 (two) times daily. 12/03/17   Plotnikov, Georgina Quint, MD    Family History Family History  Problem Relation Age of Onset  . Heart disease Father   . CVA Mother   . Heart disease Maternal Grandfather     Social History Social History   Tobacco Use  . Smoking status: Former Smoker    Types: Cigarettes    Last attempt to quit: 2018    Years since quitting: 1.0  . Smokeless tobacco: Never Used  Substance Use Topics  . Alcohol use: Yes    Alcohol/week: 0.0 oz    Comment: occasional  . Drug use: No     Allergies   Sulfonamide derivatives   Review of Systems Review of Systems  Constitutional: Negative for fever.  HENT: Negative for congestion, dental problem, rhinorrhea and sinus pressure.   Eyes: Negative for photophobia, discharge, redness and visual disturbance.  Respiratory: Negative for shortness of breath.   Cardiovascular: Negative for chest pain.  Gastrointestinal: Negative for nausea and vomiting.  Musculoskeletal: Positive for gait problem. Negative for neck pain and neck stiffness.  Skin: Negative for rash.  Neurological: Positive for weakness, numbness and headaches. Negative for syncope, speech difficulty and light-headedness.  Psychiatric/Behavioral: Negative for confusion.     Physical Exam Updated Vital Signs BP 112/65 (BP Location: Left Arm)   Pulse 90   Temp 98.1 F (36.7 C) (Oral)   Resp 16   Ht 6' (1.829 m)   Wt 79.8 kg (176 lb)   SpO2 98%   BMI 23.87 kg/m   Physical Exam  Constitutional: He appears well-developed and well-nourished.  HENT:  Head: Normocephalic and atraumatic.  Mouth/Throat: Oropharynx is clear and moist.  Eyes: Conjunctivae are normal. Right eye exhibits no discharge. Left eye exhibits no discharge.  Neck: Normal range of  motion. Neck supple.  Cardiovascular: Normal rate, regular rhythm and normal heart sounds.  Pulmonary/Chest: Effort normal and breath sounds normal.  Abdominal: Soft. There is no tenderness.  Neurological: He is alert. He has normal strength. He is not disoriented. He displays tremor (R arm, not acute). A sensory deficit is present. No cranial nerve deficit. He displays no seizure activity. GCS eye subscore is 4. GCS verbal subscore is 5. GCS motor subscore is 6.  Patient unable to discern sharp sensation distal to the elbow, preserved in upper arm. Unable to do fine motor exercises with left hand. Proprioception abnormal L hand. Strength seems preserved L UE. L arm pronator drift noted.   Skin: Skin is warm and dry.  Psychiatric: He has a normal mood and affect.  Nursing note and vitals reviewed.    ED  Treatments / Results  Labs (all labs ordered are listed, but only abnormal results are displayed) Labs Reviewed  CBC - Abnormal; Notable for the following components:      Result Value   WBC 11.6 (*)    All other components within normal limits  DIFFERENTIAL - Abnormal; Notable for the following components:   Neutro Abs 8.7 (*)    All other components within normal limits  COMPREHENSIVE METABOLIC PANEL - Abnormal; Notable for the following components:   Glucose, Bld 125 (*)    All other components within normal limits  I-STAT CHEM 8, ED - Abnormal; Notable for the following components:   Chloride 100 (*)    Glucose, Bld 128 (*)    All other components within normal limits  PROTIME-INR  APTT  ETHANOL  RAPID URINE DRUG SCREEN, HOSP PERFORMED  URINALYSIS, ROUTINE W REFLEX MICROSCOPIC  I-STAT TROPONIN, ED  CBG MONITORING, ED    ED ECG REPORT   Date: 12/06/2017  Rate: 93  Rhythm: normal sinus rhythm  QRS Axis: normal  Intervals: normal  ST/T Wave abnormalities: normal  Conduction Disutrbances:none  Narrative Interpretation:   Old EKG Reviewed: unchanged  I have personally  reviewed the EKG tracing and agree with the computerized printout as noted.  Radiology Ct Head Wo Contrast  Result Date: 12/06/2017 CLINICAL DATA:  Left arm and hand numbness beginning yesterday. Old stroke with right-sided weakness. EXAM: CT HEAD WITHOUT CONTRAST TECHNIQUE: Contiguous axial images were obtained from the base of the skull through the vertex without intravenous contrast. COMPARISON:  CT 11/27/2017. MRI 11/21/2017. multiple previous examinations 2,018. FINDINGS: Brain: No abnormality seen affecting the brainstem or cerebellum. In the left hemisphere, there is an old/subacute hemorrhagic insult in the occipital lobe which is undergoing expected evolutionary changes without evidence of new bleeding. Old hematoma in the left parietal cortical and subcortical region as seen previously, measuring about 3 cm in diameter, undergoing expected evolutionary changes. No evidence of enlargement or new bleeding. Mild chronic small-vessel ischemic changes elsewhere within the left hemispheric white matter. In the right hemisphere, there is a new intraparenchymal hemorrhage in the right inferior temporal lobe measuring approximately 2 x 2 x 1 cm, volume 2 cc. Mild surrounding edema suggests that this is subacute. Second new intraparenchymal hematoma in the peripheral right temporal lobe measuring approximately 2 x 2.5 by 2 cm, volume 5 cc. Small amount of surrounding edema as well. Some adjacent subarachnoid blood in nearby sulci, probably secondary to penetration at this site. Small old right frontal cortical and subcortical infarction. Chronic small-vessel ischemic changes throughout the white matter. No evidence of hydrocephalus or intraventricular blood. Vascular: There is atherosclerotic calcification of the major vessels at the base of the brain. Skull: Negative Sinuses/Orbits: Clear/normal Other: None significant IMPRESSION: Newly seen intraparenchymal hemorrhages in the right inferior temporal lobe and  right parietal lobe, with some subarachnoid blood in the sulci near the right parietal hematoma. 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 No evidence of mass effect or shift. These results were called by telephone at the time of interpretation on 12/06/2017 at 10:54 am to Dr. Doug Sou , who verbally acknowledged these results. Electronically Signed   By: Paulina Fusi M.D.   On: 12/06/2017 11:00    Procedures Procedures (including critical care time)  Medications Ordered in ED Medications - No data to display  Initial Impression / Assessment and Plan / ED Course  I have reviewed the triage vital signs and the nursing notes.  Pertinent labs & imaging results that were available during my care of the patient were reviewed by me and considered in my medical decision making (see chart for details).     Patient seen and examined. CT reviewed. Suspect hemorrhagic stroke. Loss of coordination, sensation, and proprioception noted on exam corresponding to CT findings. D/w Dr. Ethelda Chick. BP is currently at goal.   Vital signs reviewed and are as follows: BP 112/65 (BP Location: Left Arm)   Pulse 90   Temp 98.1 F (36.7 C) (Oral)   Resp 16   Ht 6' (1.829 m)   Wt 79.8 kg (176 lb)   SpO2 98%   BMI 23.87 kg/m   11:29 AM spoke with neurologist regarding findings.  Spoke with neuro hospitalist team who will see patient.  Patient and wife updated.  CRITICAL CARE Performed by: Carolee Rota Total critical care time: 35 minutes Critical care time was exclusive of separately billable procedures and treating other patients. Critical care was necessary to treat or prevent imminent or life-threatening deterioration. Critical care was time spent personally by me on the following activities: development of treatment plan with patient  and/or surrogate as well as nursing, discussions with consultants, evaluation of patient's response to treatment, examination of patient, obtaining history from patient or surrogate, ordering and performing treatments and interventions, ordering and review of laboratory studies, ordering and review of radiographic studies, pulse oximetry and re-evaluation of patient's condition.   Final Clinical Impressions(s) / ED Diagnoses   Final diagnoses:  Intracranial hemorrhage (HCC)  Acute left-sided weakness   Admit.   ED Discharge Orders    None       Renne Crigler, Cordelia Poche 12/06/17 1130    Doug Sou, MD 12/06/17 [phone]

## 2017-12-06 NOTE — ED Provider Notes (Signed)
Developed numbness in his left hand yesterday afternoon as well as mild headache and difficulty controlling his left hand.  No other complaint.  Nothing makes symptoms better or worse.  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  He walks with a walker.   Doug SouJacubowitz, Aneesah Hernan, MD 12/06/17 1115

## 2017-12-06 NOTE — ED Triage Notes (Signed)
Per Pt, Pt had a stroke in October and last weekend was sent home after a TIA with some left hand numbness. Pt reports that yesterday, the left arm numbness returned and has been continuous. Denies any blurred vision, double vision, slurred speech, or new weakness. Hx of right arm weakness and leg weakness. Normally uses walker at home, but has been unsteady.

## 2017-12-06 NOTE — ED Notes (Signed)
Neuro at bedside.

## 2017-12-06 NOTE — ED Notes (Signed)
Pt moved from our stretcher to a hospital bed.

## 2017-12-07 ENCOUNTER — Inpatient Hospital Stay (HOSPITAL_COMMUNITY): Payer: Medicare HMO

## 2017-12-07 ENCOUNTER — Telehealth: Payer: Self-pay | Admitting: Neurology

## 2017-12-07 ENCOUNTER — Ambulatory Visit: Payer: Medicare HMO

## 2017-12-07 ENCOUNTER — Ambulatory Visit: Payer: Medicare HMO | Admitting: Physical Therapy

## 2017-12-07 ENCOUNTER — Ambulatory Visit: Payer: Medicare HMO | Admitting: Occupational Therapy

## 2017-12-07 ENCOUNTER — Encounter (HOSPITAL_COMMUNITY): Payer: Self-pay

## 2017-12-07 DIAGNOSIS — I69351 Hemiplegia and hemiparesis following cerebral infarction affecting right dominant side: Secondary | ICD-10-CM

## 2017-12-07 DIAGNOSIS — M6289 Other specified disorders of muscle: Secondary | ICD-10-CM

## 2017-12-07 DIAGNOSIS — I610 Nontraumatic intracerebral hemorrhage in hemisphere, subcortical: Secondary | ICD-10-CM

## 2017-12-07 DIAGNOSIS — Z8679 Personal history of other diseases of the circulatory system: Secondary | ICD-10-CM

## 2017-12-07 DIAGNOSIS — I680 Cerebral amyloid angiopathy: Secondary | ICD-10-CM

## 2017-12-07 DIAGNOSIS — G8194 Hemiplegia, unspecified affecting left nondominant side: Secondary | ICD-10-CM

## 2017-12-07 DIAGNOSIS — I611 Nontraumatic intracerebral hemorrhage in hemisphere, cortical: Secondary | ICD-10-CM

## 2017-12-07 DIAGNOSIS — G40109 Localization-related (focal) (partial) symptomatic epilepsy and epileptic syndromes with simple partial seizures, not intractable, without status epilepticus: Secondary | ICD-10-CM

## 2017-12-07 LAB — CBC
HCT: 43.4 % (ref 39.0–52.0)
HEMOGLOBIN: 14.6 g/dL (ref 13.0–17.0)
MCH: 30.5 pg (ref 26.0–34.0)
MCHC: 33.6 g/dL (ref 30.0–36.0)
MCV: 90.6 fL (ref 78.0–100.0)
PLATELETS: 265 10*3/uL (ref 150–400)
RBC: 4.79 MIL/uL (ref 4.22–5.81)
RDW: 14.6 % (ref 11.5–15.5)
WBC: 10.7 10*3/uL — AB (ref 4.0–10.5)

## 2017-12-07 LAB — BASIC METABOLIC PANEL
Anion gap: 12 (ref 5–15)
BUN: 15 mg/dL (ref 6–20)
CHLORIDE: 104 mmol/L (ref 101–111)
CO2: 21 mmol/L — ABNORMAL LOW (ref 22–32)
Calcium: 9.4 mg/dL (ref 8.9–10.3)
Creatinine, Ser: 0.78 mg/dL (ref 0.61–1.24)
Glucose, Bld: 118 mg/dL — ABNORMAL HIGH (ref 65–99)
POTASSIUM: 4.1 mmol/L (ref 3.5–5.1)
SODIUM: 137 mmol/L (ref 135–145)

## 2017-12-07 LAB — GLUCOSE, CAPILLARY: GLUCOSE-CAPILLARY: 103 mg/dL — AB (ref 65–99)

## 2017-12-07 LAB — VALPROIC ACID LEVEL: VALPROIC ACID LVL: 30 ug/mL — AB (ref 50.0–100.0)

## 2017-12-07 LAB — MRSA PCR SCREENING: MRSA BY PCR: NEGATIVE

## 2017-12-07 MED ORDER — PANTOPRAZOLE SODIUM 40 MG PO TBEC
40.0000 mg | DELAYED_RELEASE_TABLET | Freq: Every day | ORAL | Status: DC
  Administered 2017-12-07 – 2017-12-10 (×4): 40 mg via ORAL
  Filled 2017-12-07 (×4): qty 1

## 2017-12-07 MED ORDER — DIVALPROEX SODIUM 250 MG PO DR TAB
500.0000 mg | DELAYED_RELEASE_TABLET | Freq: Two times a day (BID) | ORAL | Status: DC
  Administered 2017-12-07 – 2017-12-10 (×6): 500 mg via ORAL
  Filled 2017-12-07 (×4): qty 2
  Filled 2017-12-07: qty 1
  Filled 2017-12-07 (×2): qty 2

## 2017-12-07 NOTE — Progress Notes (Signed)
STROKE TEAM PROGRESS NOTE   SUBJECTIVE (INTERVAL HISTORY) His wife is at the bedside.  Pt awake alert AAOx3, still has mainly left UE weakness, LLE strong. Reviewed images with wife and discussed with them about CAA. BP stable. Repeat CT this am stable ICH.    OBJECTIVE Temp:  [97.7 F (36.5 C)] 97.7 F (36.5 C) (01/15 0800) Pulse Rate:  [64-87] 83 (01/15 1100) Cardiac Rhythm: Normal sinus rhythm (01/15 0800) Resp:  [12-22] 22 (01/15 1200) BP: (99-138)/(58-97) 121/87 (01/15 1200) SpO2:  [90 %-97 %] 92 % (01/15 1100)  Recent Labs  Lab 12/06/17 1419 12/07/17 1218  GLUCAP 99 103*   Recent Labs  Lab 12/06/17 0942 12/06/17 1002 12/07/17 0922  NA 138 141 137  K 4.3 4.3 4.1  CL 102 100* 104  CO2 23  --  21*  GLUCOSE 125* 128* 118*  BUN 15 18 15   CREATININE 0.82 0.70 0.78  CALCIUM 9.8  --  9.4   Recent Labs  Lab 12/06/17 0942  AST 19  ALT 17  ALKPHOS 59  BILITOT 1.1  PROT 7.3  ALBUMIN 3.8   Recent Labs  Lab 12/06/17 0942 12/06/17 1002 12/07/17 0922  WBC 11.6*  --  10.7*  NEUTROABS 8.7*  --   --   HGB 16.0 17.0 14.6  HCT 47.1 50.0 43.4  MCV 92.4  --  90.6  PLT 307  --  265   No results for input(s): CKTOTAL, CKMB, CKMBINDEX, TROPONINI in the last 168 hours. Recent Labs    12/06/17 0942  LABPROT 12.9  INR 0.98   Recent Labs    12/06/17 1500  COLORURINE AMBER*  LABSPEC 1.018  PHURINE 7.0  GLUCOSEU NEGATIVE  HGBUR NEGATIVE  BILIRUBINUR NEGATIVE  KETONESUR NEGATIVE  PROTEINUR NEGATIVE  NITRITE NEGATIVE  LEUKOCYTESUR NEGATIVE       Component Value Date/Time   CHOL 131 09/02/2017 0314   TRIG 73 09/02/2017 0314   TRIG 65 09/21/2006 0832   HDL 35 (L) 09/02/2017 0314   CHOLHDL 3.7 09/02/2017 0314   VLDL 15 09/02/2017 0314   LDLCALC 81 09/02/2017 0314   Lab Results  Component Value Date   HGBA1C 5.9 (H) 09/02/2017      Component Value Date/Time   LABOPIA NONE DETECTED 12/06/2017 1506   COCAINSCRNUR NONE DETECTED 12/06/2017 1506   LABBENZ  NONE DETECTED 12/06/2017 1506   AMPHETMU NONE DETECTED 12/06/2017 1506   THCU NONE DETECTED 12/06/2017 1506   LABBARB NONE DETECTED 12/06/2017 1506    Recent Labs  Lab 12/06/17 0950  ETH <10    I have personally reviewed the radiological images below and agree with the radiology interpretations.  Ct Head Wo Contrast  Result Date: 12/07/2017 CLINICAL DATA:  Follow-up stroke. History of stroke, seizures, hypertension. EXAM: CT HEAD WITHOUT CONTRAST TECHNIQUE: Contiguous axial images were obtained from the base of the skull through the vertex without intravenous contrast. COMPARISON:  CT HEAD December 06, 2017 and November 27, 2017 and MRI of the head November 21, 2017 FINDINGS: BRAIN: Evolving acute RIGHT temporal 2.4 x 1 cm intraparenchymal hematoma with similar vasogenic edema. Evolving acute RIGHT parietal 2.1 x 2.3 cm intraparenchymal hematoma with similar vasogenic edema. Subacute to old LEFT parietal lobe, LEFT occipital lobe hematomas. Small volume RIGHT frontoparietal subarachnoid hemorrhage. Regional mass effect without midline shift. Moderate ventriculomegaly on the basis of global parenchymal brain volume loss. No acute large vascular territory infarcts. Basal cisterns are patent. VASCULAR: Moderate calcific atherosclerosis of the carotid siphons. SKULL: No  skull fracture. No significant scalp soft tissue swelling. SINUSES/ORBITS: Approximate tooth 16 periapical abscess with oral antral fistula and mild LEFT maxillary sinus mucosal thickening. Small RIGHT maxillary sinus mucosal retention cyst. Mild frontal, ethmoid and sphenoid sinus mucosal thickening. Trace LEFT mastoid effusion. The included ocular globes and orbital contents are non-suspicious. OTHER: None. IMPRESSION: 1. Evolving acute RIGHT temporal and RIGHT parietal lobe intraparenchymal hematomas. Regional mass effect without midline shift. 2. Small volume RIGHT subarachnoid hemorrhage. 3. Subacute to old LEFT parietal LEFT occipital  lobe hematomas. 4. Constellation of findings suggest amyloid angiopathy. Electronically Signed   By: Awilda Metro M.D.   On: 12/07/2017 01:12   Ct Head Wo Contrast  Result Date: 12/06/2017 CLINICAL DATA:  Left arm and hand numbness beginning yesterday. Old stroke with right-sided weakness. EXAM: CT HEAD WITHOUT CONTRAST TECHNIQUE: Contiguous axial images were obtained from the base of the skull through the vertex without intravenous contrast. COMPARISON:  CT 11/27/2017. MRI 11/21/2017. multiple previous examinations 2,018. FINDINGS: Brain: No abnormality seen affecting the brainstem or cerebellum. In the left hemisphere, there is an old/subacute hemorrhagic insult in the occipital lobe which is undergoing expected evolutionary changes without evidence of new bleeding. Old hematoma in the left parietal cortical and subcortical region as seen previously, measuring about 3 cm in diameter, undergoing expected evolutionary changes. No evidence of enlargement or new bleeding. Mild chronic small-vessel ischemic changes elsewhere within the left hemispheric white matter. In the right hemisphere, there is a new intraparenchymal hemorrhage in the right inferior temporal lobe measuring approximately 2 x 2 x 1 cm, volume 2 cc. Mild surrounding edema suggests that this is subacute. Second new intraparenchymal hematoma in the peripheral right temporal lobe measuring approximately 2 x 2.5 by 2 cm, volume 5 cc. Small amount of surrounding edema as well. Some adjacent subarachnoid blood in nearby sulci, probably secondary to penetration at this site. Small old right frontal cortical and subcortical infarction. Chronic small-vessel ischemic changes throughout the white matter. No evidence of hydrocephalus or intraventricular blood. Vascular: There is atherosclerotic calcification of the major vessels at the base of the brain. Skull: Negative Sinuses/Orbits: Clear/normal Other: None significant IMPRESSION: Newly seen  intraparenchymal hemorrhages in the right inferior temporal lobe and right parietal lobe, with some subarachnoid blood in the sulci near the right parietal hematoma. 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 No evidence of mass effect or shift. These results were called by telephone at the time of interpretation on 12/06/2017 at 10:54 am to Dr. Doug Sou , who verbally acknowledged these results. Electronically Signed   By: Paulina Fusi M.D.   On: 12/06/2017 11:00   Ct Head Wo Contrast  Result Date: 11/27/2017 CLINICAL DATA:  Episode of numbness to LEFT hand lasting 5 minutes yesterday, today with numbness in LEFT hand, LEFT arm, LEFT ear and jaw lasting 10 minutes which has since resolved, history of stroke in October 2018, coronary artery disease, hypertension, hyperlipidemia, former smoker EXAM: CT HEAD WITHOUT CONTRAST TECHNIQUE: Contiguous axial images were obtained from the base of the skull through the vertex without intravenous contrast. Sagittal and coronal MPR images reconstructed from axial data set. COMPARISON:  10/23/2017 ; interval MR brain 11/21/2017 FINDINGS: Brain: Generalized atrophy. Normal ventricular morphology. No midline shift or mass effect. Small vessel chronic ischemic changes of deep cerebral white matter. Again identified area of infarction at the  high LEFT parietal region with interval resolution of the high attenuation hemorrhagic focus seen on the previous exam. New area of low attenuation at the LEFT occipital lobe since the prior CT corresponding to hemorrhagic infarct identified on interval MR. No additional intracranial hemorrhage, mass lesion, evidence of acute infarction, or extra-axial fluid collection. Vascular: No hyperdense vessels. Mild atherosclerotic calcification of internal carotid arteries at skullbase  Skull: Intact Sinuses/Orbits: Minimal fluid or mucus dependently in sphenoid sinus. Minimal mucosal thickening or small mucosal retention cyst in a RIGHT ethmoid air cell. Remaining visualized paranasal sinuses and mastoid air cells clear. Nasal septal deviation to the RIGHT. Other: N/A IMPRESSION: Atrophy with small vessel chronic ischemic changes of deep cerebral white matter. Evolving high LEFT posterior parietal infarct with resolution of the high attenuation hematoma component since previous study. Evolving hemorrhagic infarct at LEFT occipital lobe. No new intracranial abnormalities. Electronically Signed   By: Ulyses SouthwardMark  Boles M.D.   On: 11/27/2017 19:31   Mr Laqueta JeanBrain W WUWo Contrast  Result Date: 11/23/2017  [facility] [address], [address] [place], [place] [phone] NEUROIMAGING REPORT STUDY DATE: 11/21/2017 PATIENT NAME: Jaime Pierce DOB: [birth year] MRN: [id number] EXAM: MRI Brain with and without contrast ORDERING CLINICIAN: Delia HeadyPramod [name] M.D. CLINICAL HISTORY: 72 year old man with history of intracranial hemorrhage COMPARISON FILMS: MRI 09/01/2017 TECHNIQUE:MRI of the brain with and without contrast was obtained utilizing 5 mm axial slices with T1, T2, T2 flair, SWI and diffusion weighted views.  T1 sagittal, T2 coronal and postcontrast views in the axial and coronal plane were obtained. CONTRAST: 15 ml [facility] IMAGING SITE: [facility], [address] FINDINGS: On sagittal images, the spinal cord is imaged caudally to C2-C3 and is normal in caliber.   The contents of the posterior fossa are of normal size and position.   The pituitary gland and optic chiasm appear normal.    There is moderate generalized cortical atrophy.  There are no abnormal extra-axial collections of fluid.  There is a 43 x 22 x 38 mm (transverse, AP, height) sub-chronic left parietal lobe hemorrhage. Compared to the 09/01/2017 MRI, the hemorrhage is mildly smaller and there has been  expected evolutionary changes with the presence of extracellular methemoglobin and development of a hemosiderin rim.  Additionally, there is a smaller subacute hemorrhage in the left occipital lobe measuring about 11 mm.  This hemorrhage is not appreciated on the 09/01/2017 MRI.   Elsewhere in the hemispheres, there are scattered T2/FLAIR hyperintense foci consistent with moderate chronic microvascular ischemic change.  The cerebellum and brainstem appears normal.   The deep gray matter appears normal.   Diffusion weighted images showed the hemorrhages.  Susceptibility weighted images show the 2 larger hemorrhages as well as several, predominantly subcortical, microhemorrhages.  The orbits appear normal.   The VIIth/VIIIth nerve complex appears normal.  The mastoid air cells appear normal.  The paranasal sinuses appear normal.  Flow voids are identified within the major intracerebral arteries.  A 13 mm T2 hyperintense focus is noted in the left parotid gland, unchanged compared to the previous MRI. After the infusion of contrast material, a normal enhancement pattern is noted.    This MRI of the brain with and without contrast shows the following: 1.    Large (432238 mm) sub-chronic hemorrhage in the left parietal lobe showing expected evolutionary changes when compared to the 09/01/2017 MRI.    There is no abnormal enhancement. 2.    Smaller (11 mm) subacute hemorrhage in the  left occipital lobe that was not present on the 09/01/2017 MRI. 3.    Few subcortical microhemorrhages.    Amyloid angiopathy cannot be ruled out but the number of microhemorrhages is much less than expected for that diagnosis. 4.    Moderate generalized cortical atrophy and chronic microvascular ischemic change. 5.    13 mm left carotid and T2 hypointense lesion. This is unchanged when compared to the previous MRI. INTERPRETING PHYSICIAN: Cuyler A. Epimenio Foot, MD, PhD, FAAN Certified in  Neuroimaging by [employer]     PHYSICAL EXAM  Temp:  [97.7 F (36.5 C)] 97.7 F (36.5 C) (01/15 0800) Pulse Rate:  [64-87] 83 (01/15 1100) Resp:  [12-22] 22 (01/15 1200) BP: (99-138)/(58-97) 121/87 (01/15 1200) SpO2:  [90 %-97 %] 92 % (01/15 1100)  General - Well nourished, well developed, in no apparent distress.  Ophthalmologic - fundi not visualized due to noncooperation.  Cardiovascular - Regular rate and rhythm with no murmur.  Mental Status -  Level of arousal and orientation to time, place, and person were intact. Language including expression, naming, repetition, comprehension was assessed and found intact. Attention span and concentration were normal. Fund of Knowledge was assessed and was intact.  Cranial Nerves II - XII - II - Visual field intact OU. III, IV, VI - Extraocular movements intact. V - Facial sensation intact bilaterally. VII - Facial movement intact bilaterally. VIII - Hearing & vestibular intact bilaterally. X - Palate elevates symmetrically. XI - Chin turning & shoulder shrug intact bilaterally. XII - Tongue protrusion intact.  Motor Strength - The patient's strength was 3/5 RUE with limited ROM at right shoulder, 4-/5 LUE proximal, 5/5 tricep and bicep, 3+/5 left hand grip with dexterity difficulty. BLE 4/5.  Bulk was normal and fasciculations were absent.   Motor Tone - Muscle tone was assessed at the neck and appendages and was normal.  Reflexes - The patient's reflexes were symmetrical in all extremities and he had no pathological reflexes.  Sensory - Light touch, temperature/pinprick were assessed and were symmetrical.    Coordination - The patient had ataxia with LUE for FTN.  Tremor was absent.  Gait and Station - deferred.   ASSESSMENT/PLAN Mr. DALLIN MCCORKEL is a 72 y.o. male with history of CAD s/p CABG, HLD, HTN, former smoker, recent ICH and seizure admitted for left arm weakness.   Acute ICH:  left frontal and left temporal ICH. Given hx of recurrent  cortical ICHs, MRI findings, good BP control, CCA most likely   Resultant new left UE weakness  CT head left frontal and temporal ICHs  Repeat CT head stable  SCDs for VTE prophylaxis  DIET SOFT Room service appropriate? Yes; Fluid consistency: Thin   No antithrombotic prior to admission, now on No antithrombotic. 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 Transient focal neurologic deficit  10/23/17 - right facial twitching - partial seizure - started on keppra  11/10/17 - outpt saw Dr. Pearlean Brownie - EEG slowing no seizure - keppra changed to depakote due to intolerance  depakote level low at 30  Change  depakote to 500mg  bid  BP management  BP stable   BP goal < 140  Avoid hypertension  Other Stroke Risk Factors  Advanced age  former smoker  Other Active Problems  Spasticity on baclofen  Hospital day # 1  This patient is critically ill due to recurrent ICHs, partial seizure and at significant risk of neurological worsening, death form recurrent ICH and status epilepticus. This patient's care requires constant monitoring of vital signs, hemodynamics, respiratory and cardiac monitoring, review of multiple databases, neurological assessment, discussion with family, other specialists and medical decision making of high complexity. I spent 40 minutes of neurocritical care time in the care of this patient.   Marvel Plan, MD PhD Stroke Neurology 12/07/2017 1:27 PM    To contact Stroke Continuity provider, please refer to [email or web address]. After hours, contact General Neurology

## 2017-12-07 NOTE — Progress Notes (Addendum)
NIH 8 at 1800 MD notified. No change per family

## 2017-12-07 NOTE — Progress Notes (Signed)
Pt arrived to unit

## 2017-12-07 NOTE — Progress Notes (Signed)
OT Cancellation Note  Patient Details Name: Delora FuelRichard A Kizer MRN: [id number] DOB: 11-23-1946   Cancelled Treatment:    Reason Eval/Treat Not Completed: Medical issues which prohibited therapy;Patient not medically ready.  Pt currently with bedrest orders.  Will reattempt.  Jaedin Regina Erieonarpe, OTR/L [phone]   Jeani HawkingConarpe, Eymi Lipuma M 12/07/2017, 10:56 AM

## 2017-12-07 NOTE — Telephone Encounter (Signed)
Rn consulted with Dr. Pearlean BrownieSethi concerning wife telephone call. RN stated pt is currently in hospital, and was seen by Dr. Roda ShuttersXu the other stroke md. The wife wants Dr. Pearlean BrownieSethi to discuss Dr.Xu diagnose.  Per Dr.[name] when any of his patients are admitted to the hospital he cannot consult on their care when he is working in the clinic. The family will need to consult with the MD in the hospital who is caring for the patient.

## 2017-12-07 NOTE — Progress Notes (Signed)
PT Cancellation Note  Patient Details Name: Jaime Pierce MRN: [id number] DOB: 05/12/1946   Cancelled Treatment:    Reason Eval/Treat Not Completed: Medical issues which prohibited therapy(pt on bedrest and await increased activity order)   [name] 12/07/2017, 7:11 AM  [name], PT [phone]

## 2017-12-07 NOTE — Plan of Care (Signed)
Pt wife, family and friends are support system. Pt verbalized understanding of need to control BP in order to prevent further bleeding after d/c.

## 2017-12-07 NOTE — Progress Notes (Signed)
Inpatient Rehabilitation  Per PT/OT request, patient was screened by [name] for appropriateness for an Inpatient Acute Rehab consult.  At this time we are recommending an Inpatient Rehab consult.  Please order if you are agreeable.    [name], M.A., CCC/SLP Admission Coordinator  [facility]  Cell [phone]  

## 2017-12-07 NOTE — Evaluation (Signed)
Speech Language Pathology Evaluation Patient Details Name: Jaime Pierce MRN: [id number] DOB: [birth year] Today's Date: 12/07/2017 Time: 1440-1510 SLP Time Calculation (min) (ACUTE ONLY): 30 min  Problem List:  Patient Active Problem List   Diagnosis Date Noted  . ICH (intracerebral hemorrhage) (HCC) 12/06/2017  . Partial seizure (HCC) 11/08/2017  . Positive colorectal cancer screening using Cologuard test 10/22/2017  . Spastic hemiparesis (HCC)   . Cough   . 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 Pain   . Hyponatremia   . Benign essential HTN   . 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 Gait disturbance, post-stroke   . Aphasia as late effect of stroke   . SAH (subarachnoid hemorrhage) (HCC) 09/02/2017  . B12 deficiency 09/02/2017  . Well adult exam 06/16/2016  . Cigarette smoker 02/23/2016  . COPD GOLD II  12/24/2014  . CAD (coronary artery disease) 07/22/2011  . HTN (hypertension) 07/22/2011  . Dyslipidemia 07/22/2011  . Smoker 07/22/2011   Past Medical History:  Past Medical History:  Diagnosis Date  . Coronary artery disease   . Dyspnea on exertion   . Hemorrhagic stroke (HCC)   . Hyperlipidemia   . Hypertension   . Seizures (HCC)   . Stroke (HCC)   . Tobacco abuse    Past Surgical History:  Past Surgical History:  Procedure Laterality Date  . CORONARY ARTERY BYPASS GRAFT     HPI:  72 yo admitted with new onset LUE tingling and weakness CT of head showed intraparenchymal hematoma right temporal and parietal lobes with small amount of edema. Old/subacute hemorrhagic insult to the Lt occipital lobe and old hematoma L parietal lobe noted. Work up for possible vasculitis underway.  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  Currently participating in OP SLP with focus on memory, executive functioning, and awareness.    Assessment /  Plan / Recommendation Clinical Impression  Pt presents with cognitive-linguistic deficits consistent with baseline function, with some exacerbation of impairments in attention and awareness.  Speech is clear; output is fluent.  Expressive/receptive language are WNL. Pt demonstrates deficits in prospective and working memory - he acknowledges strategies he has learned to facilitate recall, but needed cues to put them into action.  Attention taxed by activity/talking in his room during assessment.  Recommend continued SLP f/u while in acute care as well as CIR to address aforementioned issues.  Pt/family agree.     SLP Assessment  SLP Recommendation/Assessment: Patient needs continued Speech Lanaguage Pathology Services    Follow Up Recommendations  Inpatient Rehab    Frequency and Duration min 2x/week  1 week      SLP Evaluation Cognition  Overall Cognitive Status: Impaired/Different from baseline Arousal/Alertness: Awake/alert Orientation Level: Oriented X4 Attention: Selective Selective Attention: Impaired Memory: Impaired Memory Impairment: Retrieval deficit;Storage deficit Awareness: Impaired Executive Function: Decision Making;Self Monitoring Decision Making: Impaired Decision Making Impairment: Verbal basic Self Monitoring: Impaired Safety/Judgment: Impaired       Comprehension  Auditory Comprehension Overall Auditory Comprehension: Appears within functional limits for tasks assessed Reading Comprehension Reading Status: Not tested    Expression Expression Primary Mode of Expression: Verbal Verbal Expression Overall Verbal Expression: Appears within functional limits for tasks assessed Written Expression Dominant Hand: Right Written Expression: Not tested   Oral / Motor  Motor Speech Overall Motor Speech: Appears within functional limits for tasks assessed   GO  Blenda MountsCouture, Levii Hairfield Laurice 12/07/2017, 3:21 PM

## 2017-12-07 NOTE — Consult Note (Signed)
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 Was ambulatory with minimal guard using a rolling walker.He had recently graduated to a quad cane with outpatient therapies. Wife would assist with some bathing and simple ADLs. Presented 12/06/2017 with left upper extremity tingling and weakness.systolic blood pressure 110-120.Cranial CT scan showed newly seen intraparenchymal hemorrhage in the right inferior temporal lobe and right parietal lobe with some subarachnoid blood right parietal .neurology follow-up with conservative care late his cranial CT scan showed evolving acute right temporal and right parietal lobe intraparenchymal hematomas. No midline shift.Patient remains on Depakote for history of seizure disorder. Tolerating a mechanical soft diet. Physical therapy evaluation completed 12/07/2017 with recommendations of physical medicine rehabilitation consult.   Review of Systems  Constitutional: Negative for chills and fever.  HENT: Negative for hearing loss.   Eyes: Negative for blurred vision and double vision.  Respiratory: Positive for shortness of breath. Negative for cough.   Cardiovascular: Positive for leg swelling. Negative for chest pain and palpitations.  Gastrointestinal: Positive for constipation. Negative for nausea and vomiting.  Genitourinary: Negative for flank pain and hematuria.  Musculoskeletal: Positive for joint pain and myalgias.  Skin: Negative for rash.  Neurological: Positive for focal weakness, seizures  and headaches.  All other systems reviewed and are negative.  Past Medical History:  Diagnosis Date  . Coronary artery disease   . Dyspnea on exertion   . Hemorrhagic stroke (HCC)   . Hyperlipidemia   . Hypertension   . Seizures (HCC)   . Stroke (HCC)   . Tobacco abuse    Past Surgical History:  Procedure Laterality Date  . CORONARY ARTERY BYPASS GRAFT     Family History  Problem Relation Age of Onset  . Heart disease Father   . CVA Mother   . Heart disease Maternal Grandfather    Social History:  reports that he quit smoking about a year ago. His smoking use included cigarettes. he has never used smokeless tobacco. He reports that he drinks alcohol. He reports that he does not use drugs. Allergies:  Allergies  Allergen Reactions  . Sulfonamide Derivatives    Medications Prior to Admission  Medication Sig Dispense Refill  . acetaminophen (TYLENOL) 325 MG tablet Take by mouth every 4 (four) hours as needed.    [name] Kitchen albuterol (PROAIR HFA) 108 (90 Base) MCG/ACT inhaler 2 puffs up to every 4 hours if can't catch your breath (Patient taking differently: Inhale 1-2 puffs into the lungs every 4 (four) hours as needed for wheezing or shortness of breath. ) 1 Inhaler 11  . b complex vitamins tablet Take 1 tablet by mouth daily. 100 tablet 3  . baclofen (LIORESAL) 20 MG tablet Take 1 tablet (20 mg total) by mouth 3 (three) times daily. 90 each 4  . budesonide-formoterol (SYMBICORT) 160-4.5 MCG/ACT inhaler Take 2 puffs first thing in am and then another 2 puffs about 12 hours later. (Patient taking differently: Inhale 2 puffs into the lungs 2 (two) times daily. ) 1 Inhaler 0  . Cholecalciferol (VITAMIN D3) 2000 units capsule Take 1 capsule (2,000 Units total) by mouth  daily. 100 capsule 3  . diclofenac sodium (VOLTAREN) 1 % GEL Apply 2 g topically 4 (four) times daily as needed (Pain). 3 Tube 4  . divalproex (DEPAKOTE) 500 MG DR tablet Take 1 tablet (500 mg total) by mouth daily. 30 tablet 2   . irbesartan (AVAPRO) 75 MG tablet Take 1 tablet (75 mg total) by mouth daily. 90 tablet 3  . metoprolol tartrate (LOPRESSOR) 25 MG tablet Take 0.5 tablets (12.5 mg total) by mouth 2 (two) times daily. 90 tablet 1  . nitroGLYCERIN (NITROSTAT) 0.4 MG SL tablet Place 1 tablet (0.4 mg total) under the tongue every 5 (five) minutes as needed for chest pain. 20 tablet 3  . oseltamivir (TAMIFLU) 75 MG capsule Take 1 capsule (75 mg total) by mouth 2 (two) times daily. 10 capsule 0    Home: Home Living Family/patient expects to be discharged to:: Private residence Living Arrangements: Spouse/significant other Available Help at Discharge: Family, Available 24 hours/day Type of Home: House Home Access: Stairs to enter Secretary/administratorntrance Stairs-Number of Steps: 2 Entrance Stairs-Rails: None Home Layout: One level Bathroom Shower/Tub: Health visitorWalk-in shower Bathroom Toilet: Pharmacist, communitytandard Bathroom Accessibility: Yes Home Equipment: Information systems managerhower seat - built in, Environmental consultantWalker - 2 wheels Additional Comments: liked to play golf  Functional History: Prior Function Level of Independence: Needs assistance Gait / Transfers Assistance Needed: Pt was ambulatory with Min guard assist using RW  ADL's / Homemaking Assistance Needed: Wife reports she assisted him with bathing peri area, but he was able to perform all other ADLs with min guard assist  Functional Status:  Mobility: Bed Mobility Overal bed mobility: Needs Assistance Bed Mobility: Supine to Sit Supine to sit: Mod assist General bed mobility comments: Pt requires min A to lift shoulders from bed and mod A to scoot hip forward - he was unable to motor plan movement and required step by step cuing  Transfers Overall transfer level: Needs assistance Transfers: Sit to/from Stand, Stand Pivot Transfers Sit to Stand: Max assist, +2 physical assistance Stand pivot transfers: Max assist, +2 physical assistance General transfer comment: Pt stood x 2.  First attempt, pt required assist  to lift hips from bed, extend hips and knees as well as extend trunk.  He demonstrated significant difficulty maintaining standing with max facilitation given due to poor proprioceptive awareness.  He returned to sitting.  RW used on second attempt.  He required assist to lift hips, and extend hips and knees.  He was able to progress to static standing with mod A. He was able to keep Rt hand on walker after it was placed there, howevere, unable to maintain Lt UE on RW.  He was unable to shift weight and required max assist to weight shift to Lt in order to pivot to Rt.  He was unable to maintain controlled posture or to pivot feet or  hips and required max A +2 to transfer into chair Ambulation/Gait General Gait Details: unable    ADL: ADL Overall ADL's : Needs assistance/impaired Eating/Feeding: Maximal assistance, Sitting Eating/Feeding Details (indicate cue type and reason): requires hand over hand assist with Lt UE  Grooming: Wash/dry hands, Wash/dry face, Oral care, Brushing hair, Maximal assistance, Sitting Upper Body Bathing: Maximal assistance, Sitting Lower Body Bathing: Maximal assistance, Sit to/from stand Upper Body Dressing : Maximal assistance, Sitting Lower Body Dressing: Total assistance, Sit to/from stand Toilet Transfer: Maximal assistance, +2 for physical assistance, Squat-pivot, BSC Toileting- Clothing Manipulation and Hygiene: Total assistance, Sit to/from stand Functional mobility during ADLs: Maximal  assistance, +2 for physical assistance, Rolling walker General ADL Comments: Pt with significant impairments bil. UEs which impact his ability to use UEs functionally   Cognition: Cognition Overall Cognitive Status: Impaired/Different from baseline Orientation Level: Oriented X4 Cognition Arousal/Alertness: Awake/alert Behavior During Therapy: WFL for tasks assessed/performed Overall Cognitive Status: Impaired/Different from baseline Area of Impairment: Attention,  Following commands, Safety/judgement, Awareness, Problem solving Current Attention Level: Selective, Alternating Following Commands: Follows one step commands consistently, Follows multi-step commands with increased time Safety/Judgement: Decreased awareness of deficits, Decreased awareness of safety Awareness: Emergent Problem Solving: Slow processing, Decreased initiation, Difficulty sequencing, Requires verbal cues, Requires tactile cues General Comments: severe sensory loss impacts his ability to initiate, sequence and execute activity - difficult to piece out cognitive deficits accurately   Blood pressure 124/67, pulse 83, temperature 97.7 F (36.5 C), temperature source Oral, resp. rate 17, height 6' (1.829 m), weight 79.8 kg (176 lb), SpO2 92 %. Physical Exam  Vitals reviewed. Constitutional: He is oriented to person, place, and time.  HENT:  Head: Normocephalic.  Eyes: EOM are normal.  Neck: Normal range of motion. Neck supple. No thyromegaly present.  Cardiovascular: Normal rate, regular rhythm and normal heart sounds.  Respiratory: Effort normal and breath sounds normal.  GI: Soft. Bowel sounds are normal. He exhibits no distension.  Neurological: He is alert and oriented to person, place, and time.  Speech might be mildly dysarthric but fully intelligible. He follows full commands. Fair awareness of deficits  Skin: Skin is warm and dry.  2- right deltoid biceps triceps 3- right finger flexors and extensors Decreased fine motor right finger to thumb opposition 4/5 in the right hip flexor knee extensor ankle dorsiflexor Left upper extremity 3/5 in the deltoid, bicep, tricep, finger flexors and extensors, needs some visual cues to perform movements. Left lower extremity 3+ in the hip flexor knee extensor ankle dorsiflexor. 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  Results for orders placed or performed during the hospital encounter of 12/06/17 (from the past 24 hour(s))  Urinalysis, Routine w reflex microscopic     Status: Abnormal   Collection Time: 12/06/17  3:00 PM  Result Value Ref Range   Color, Urine AMBER (A) YELLOW   APPearance CLOUDY (A) CLEAR   Specific Gravity, Urine 1.018 1.005 - 1.030   pH 7.0 5.0 - 8.0   Glucose, UA NEGATIVE NEGATIVE mg/dL   Hgb urine dipstick NEGATIVE NEGATIVE   Bilirubin Urine NEGATIVE NEGATIVE   Ketones, ur NEGATIVE NEGATIVE mg/dL   Protein, ur NEGATIVE NEGATIVE mg/dL   Nitrite NEGATIVE NEGATIVE   Leukocytes, UA NEGATIVE NEGATIVE  Urine rapid drug screen (hosp performed)     Status: None   Collection Time: 12/06/17  3:06 PM  Result Value Ref Range   Opiates NONE DETECTED NONE DETECTED   Cocaine NONE DETECTED NONE DETECTED   Benzodiazepines NONE DETECTED NONE DETECTED   Amphetamines NONE DETECTED NONE DETECTED   Tetrahydrocannabinol NONE DETECTED NONE DETECTED   Barbiturates NONE DETECTED NONE DETECTED  MRSA PCR Screening     Status: None   Collection Time: 12/06/17  8:06 PM  Result Value Ref Range   MRSA by PCR NEGATIVE NEGATIVE  CBC     Status: Abnormal  Collection Time: 12/07/17  9:22 AM  Result Value Ref Range   WBC 10.7 (H) 4.0 - 10.5 K/uL   RBC 4.79 4.22 - 5.81 MIL/uL   Hemoglobin 14.6 13.0 - 17.0 g/dL   HCT 95.6 21.3 - 08.6 %   MCV 90.6 78.0 - 100.0 fL   MCH 30.5 26.0 - 34.0 pg   MCHC 33.6 30.0 - 36.0 g/dL   RDW 57.8 46.9 - 62.9 %   Platelets 265 150 - 400 K/uL  Basic metabolic panel     Status: Abnormal   Collection Time: 12/07/17  9:22 AM  Result Value Ref Range   Sodium 137 135 - 145 mmol/L   Potassium 4.1 3.5 - 5.1 mmol/L   Chloride 104 101 - 111 mmol/L   CO2 21 (L) 22 - 32 mmol/L   Glucose, Bld 118 (H) 65 - 99  mg/dL   BUN 15 6 - 20 mg/dL   Creatinine, Ser 5.28 0.61 - 1.24 mg/dL   Calcium 9.4 8.9 - 41.3 mg/dL   GFR calc non Af Amer >60 >60 mL/min   GFR calc Af Amer >60 >60 mL/min   Anion gap 12 5 - 15  Valproic acid level     Status: Abnormal   Collection Time: 12/07/17  9:22 AM  Result Value Ref Range   Valproic Acid Lvl 30 (L) 50.0 - 100.0 ug/mL  Glucose, capillary     Status: Abnormal   Collection Time: 12/07/17 12:18 PM  Result Value Ref Range   Glucose-Capillary 103 (H) 65 - 99 mg/dL   Ct Head Wo Contrast  Result Date: 12/07/2017 CLINICAL DATA:  Follow-up stroke. History of stroke, seizures, hypertension. EXAM: CT HEAD WITHOUT CONTRAST TECHNIQUE: Contiguous axial images were obtained from the base of the skull through the vertex without intravenous contrast. COMPARISON:  CT HEAD December 06, 2017 and November 27, 2017 and MRI of the head November 21, 2017 FINDINGS: BRAIN: Evolving acute RIGHT temporal 2.4 x 1 cm intraparenchymal hematoma with similar vasogenic edema. Evolving acute RIGHT parietal 2.1 x 2.3 cm intraparenchymal hematoma with similar vasogenic edema. Subacute to old LEFT parietal lobe, LEFT occipital lobe hematomas. Small volume RIGHT frontoparietal subarachnoid hemorrhage. Regional mass effect without midline shift. Moderate ventriculomegaly on the basis of global parenchymal brain volume loss. No acute large vascular territory infarcts. Basal cisterns are patent. VASCULAR: Moderate calcific atherosclerosis of the carotid siphons. SKULL: No skull fracture. No significant scalp soft tissue swelling. SINUSES/ORBITS: Approximate tooth 16 periapical abscess with oral antral fistula and mild LEFT maxillary sinus mucosal thickening. Small RIGHT maxillary sinus mucosal retention cyst. Mild frontal, ethmoid and sphenoid sinus mucosal thickening. Trace LEFT mastoid effusion. The included ocular globes and orbital contents are non-suspicious. OTHER: None. IMPRESSION: 1. Evolving acute RIGHT  temporal and RIGHT parietal lobe intraparenchymal hematomas. Regional mass effect without midline shift. 2. Small volume RIGHT subarachnoid hemorrhage. 3. Subacute to old LEFT parietal LEFT occipital lobe hematomas. 4. Constellation of findings suggest amyloid angiopathy. Electronically Signed   By: Awilda Metro M.D.   On: 12/07/2017 01:12   Ct Head Wo Contrast  Result Date: 12/06/2017 CLINICAL DATA:  Left arm and hand numbness beginning yesterday. Old stroke with right-sided weakness. EXAM: CT HEAD WITHOUT CONTRAST TECHNIQUE: Contiguous axial images were obtained from the base of the skull through the vertex without intravenous contrast. COMPARISON:  CT 11/27/2017. MRI 11/21/2017. multiple previous examinations 2,018. FINDINGS: Brain: No abnormality seen affecting the brainstem or cerebellum. In the left hemisphere, there is an old/subacute hemorrhagic  insult in the occipital lobe which is undergoing expected evolutionary changes without evidence of new bleeding. Old hematoma in the left parietal cortical and subcortical region as seen previously, measuring about 3 cm in diameter, undergoing expected evolutionary changes. No evidence of enlargement or new bleeding. Mild chronic small-vessel ischemic changes elsewhere within the left hemispheric white matter. In the right hemisphere, there is a new intraparenchymal hemorrhage in the right inferior temporal lobe measuring approximately 2 x 2 x 1 cm, volume 2 cc. Mild surrounding edema suggests that this is subacute. Second new intraparenchymal hematoma in the peripheral right temporal lobe measuring approximately 2 x 2.5 by 2 cm, volume 5 cc. Small amount of surrounding edema as well. Some adjacent subarachnoid blood in nearby sulci, probably secondary to penetration at this site. Small old right frontal cortical and subcortical infarction. Chronic small-vessel ischemic changes throughout the white matter. No evidence of hydrocephalus or intraventricular  blood. Vascular: There is atherosclerotic calcification of the major vessels at the base of the brain. Skull: Negative Sinuses/Orbits: Clear/normal Other: None significant IMPRESSION: Newly seen intraparenchymal hemorrhages in the right inferior temporal lobe and right parietal lobe, with some subarachnoid blood in the sulci near the right parietal hematoma. 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 No evidence of mass effect or shift. These results were called by telephone at the time of interpretation on 12/06/2017 at 10:54 am to Dr. Doug Sou , who verbally acknowledged these results. Electronically Signed   By: Paulina Fusi M.D.   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 Does the need for close, 24 hr/day medical supervision in concert with the patient's rehab needs make it unreasonable for this patient to be served in a less intensive setting? Yes 2. Co-Morbidities requiring supervision/potential complications: Coronary artery disease status post CABG hypertension 3. Due to bladder management, bowel management, safety, skin/wound care, disease management, medication administration, pain management and patient education, does the patient require 24 hr/day rehab nursing? Yes 4. Does the patient require coordinated care of a physician, rehab nurse, PT (1-2 hrs/day, 5 days/week), OT (1-2 hrs/day, 5 days/week) and SLP (.5-1 hrs/day, 5 days/week) to address physical and functional deficits in the context of the above medical diagnosis(es)? Yes Addressing deficits in the  following areas: balance, endurance, locomotion, strength, transferring, bowel/bladder control, bathing, dressing, feeding, grooming, toileting and psychosocial support 5. Can the patient actively participate in an intensive therapy program of at least 3 hrs of therapy per day at least 5 days per week? Yes 6. The potential for patient to make measurable gains while on inpatient rehab is good 7. Anticipated functional outcomes upon discharge from inpatient rehab are min assist  with PT, min assist with OT, min assist with SLP. 8. Estimated rehab length of stay to reach the above functional goals is: 21-24d 9. Anticipated D/C setting: Home 10. Anticipated post D/C treatments: HH therapy 11. Overall Rehab/Functional Prognosis: fair  RECOMMENDATIONS: This patient's condition is appropriate for continued rehabilitative care in the following setting: CIR Patient has agreed to participate in recommended program. Yes Note that insurance prior authorization may be required for reimbursement for  recommended care.  Comment: Discussed with patient wife and daughter that outcome will not be as good as his prior rehab stay, due to bilateral hemiparesis as well as severe sensory deficits on the left side  Erick Colace M.D. [facility] FAAPM&R (Sports Med, Neuromuscular Med) Diplomate Am Board of Electrodiagnostic Med  Lynnae Prude 12/07/2017

## 2017-12-07 NOTE — Telephone Encounter (Signed)
Patient's wife calling. Patient had a stroke and is in the hospital under Dr. Warren DanesXu's care. She would like a call back from Dr. Pearlean BrownieSethi to discuss Dr. Warren DanesXu's diagnosis.

## 2017-12-07 NOTE — Evaluation (Signed)
Physical Therapy Evaluation Patient Details Name: Jaime Pierce MRN: [id number] DOB: [birth year] Today's Date: 12/07/2017   History of Present Illness  72 yo admitted with new onset LUE tingling and weakness CT of head showed intraparenchymal hematoma right temporal and parietal lobes with small amount of edema.  Old/subacute hemorrhagic insult to the Lt  occipital lobe. and old hematoma  L parietal lobe noted.  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 Pt had previously progressed to walking with a RW at home and quad cane with OPPT but unable to achieve or maintain sitting without mod assist. Pt will benefit from acute therapy to maximize mobility, function, balance, strength, and transfers to decrease burden of care.      Follow Up Recommendations CIR;Supervision/Assistance - 24 hour    Equipment Recommendations  Wheelchair cushion (measurements PT);Wheelchair (measurements PT)    Recommendations for Other Services       Precautions / Restrictions Precautions Precautions: Fall Precaution Comments: impaired procioceptive awareness bil.       Mobility  Bed Mobility Overal bed mobility: Needs Assistance Bed Mobility: Supine to Sit     Supine to sit: Mod assist     General bed mobility comments: Pt requires min A to lift shoulders from bed and mod A to scoot hip forward - he was unable to motor plan movement and required step by step cuing   Transfers Overall transfer level: Needs assistance   Transfers: Sit to/from Stand;Stand Pivot Transfers Sit to Stand: Max assist;+2 physical assistance Stand pivot transfers: Max assist;+2 physical  assistance       General transfer comment: Pt stood x 2.  First attempt, pt required assist to lift hips from bed, extend hips and knees as well as extend trunk.  He demonstrated significant difficulty maintaining standing with max facilitation given due to poor proprioceptive awareness.  He returned to sitting.  RW used on second attempt.  He required assist to lift hips, and extend hips and knees.  He was able to progress to static standing with mod A. He was able to keep Rt hand on walker after it was placed there, howevere, unable to maintain Lt UE on RW.  He was unable to shift weight and required max assist to weight shift to Lt in order to pivot to Rt.  He was unable to maintain controlled posture or to pivot feet or  hips and required max A +2 to transfer into chair  Ambulation/Gait             General Gait Details: unable  Stairs            Wheelchair Mobility    Modified Rankin (Stroke Patients Only) Modified Rankin (Stroke Patients Only) Pre-Morbid Rankin Score: Moderate disability Modified Rankin: Severe disability     Balance Overall balance assessment: Needs assistance Sitting-balance support: Feet supported Sitting balance-Leahy Scale: Fair     Standing balance support: Single extremity supported Standing balance-Leahy Scale: Poor Standing balance comment: Pt initially required max A +2 with facilitation/assist for hip and knee extension, as well as extension of trunk.  Second attempt at standing, he was able to progress to mod A +1 for brief  period - requires facilitation/assist for hip, knee and trunk extension as well as placement of UEs                              Pertinent Vitals/Pain Pain Assessment: No/denies pain    Home Living Family/patient expects to be discharged to:: Private residence Living Arrangements: Spouse/significant other Available Help at Discharge: Family;Available 24 hours/day Type of Home: House Home Access: Stairs  to enter Entrance Stairs-Rails: None Entrance Stairs-Number of Steps: 2 Home Layout: One level Home Equipment: Shower seat - built in;Walker - 2 wheels Additional Comments: liked to play golf    Prior Function Level of Independence: Needs assistance   Gait / Transfers Assistance Needed: Pt was ambulatory with Min guard assist using RW   ADL's / Homemaking Assistance Needed: Wife reports she assisted him with bathing peri area, but he was able to perform all other ADLs with min guard assist         Hand Dominance   Dominant Hand: Right(although has been using Lt as dominant since prior CVA )    Extremity/Trunk Assessment   Upper Extremity Assessment Upper Extremity Assessment: Defer to OT evaluation RUE Deficits / Details: Significant tremor noted Rt UE with mod spasticity (wife reports this is worse than it was PTA).   He is able to use Rt UE as a gross assist  with assistance  RUE Sensation: decreased proprioception RUE Coordination: decreased fine motor;decreased gross motor LUE Deficits / Details: Pt with ~110* shoulder flexion; elbow flex/ext [facility] actively.  He demonstrates gross grasp and release.  He is unable to opose digits dysmetria noted.  Proprioception appears absent as does stereognosis.  He is unable to grade amount of force used/needed to hold onto objects  LUE Sensation: decreased proprioception LUE Coordination: decreased fine motor;decreased gross motor    Lower Extremity Assessment Lower Extremity Assessment: Generalized weakness(bil LE 3/5 strength with functional ROM)    Cervical / Trunk Assessment Cervical / Trunk Assessment: Other exceptions Cervical / Trunk Exceptions: Impaired sensation.  impaired proprioception resulting in decreased ability to isolate trunk movement   Communication   Communication: No difficulties  Cognition Arousal/Alertness: Awake/alert Behavior During Therapy: WFL for tasks assessed/performed Overall Cognitive Status:  Impaired/Different from baseline Area of Impairment: Attention;Following commands;Safety/judgement;Awareness;Problem solving                   Current Attention Level: Selective;Alternating   Following Commands: Follows one step commands consistently;Follows multi-step commands with increased time Safety/Judgement: Decreased awareness of deficits;Decreased awareness of safety Awareness: Emergent Problem Solving: Slow processing;Decreased initiation;Difficulty sequencing;Requires verbal cues;Requires tactile cues General Comments: severe sensory loss impacts his ability to initiate, sequence and execute activity - difficult to piece out cognitive deficits accurately       General Comments General comments (skin integrity, edema, etc.): VSS.  Wife present throughout     Exercises     Assessment/Plan    PT Assessment Patient needs continued PT services  PT Problem List Decreased strength;Decreased mobility;Decreased activity tolerance;Decreased balance;Decreased knowledge of use of DME;Decreased cognition;Decreased coordination;Decreased safety awareness;Impaired sensation       PT Treatment Interventions Gait training;Therapeutic exercise;Patient/family education;Balance training;Functional mobility training;Neuromuscular re-education;DME instruction;Therapeutic activities;Cognitive remediation    PT Goals (Current goals can be found in the Care Plan section)  Acute Rehab PT Goals Patient Stated Goal: to regain independence  PT Goal Formulation: With patient/family Time For Goal Achievement: 12/21/17 Potential to Achieve Goals: Fair  Frequency Min 4X/week   Barriers to discharge Decreased caregiver support      Co-evaluation PT/OT/SLP Co-Evaluation/Treatment: Yes Reason for Co-Treatment: Complexity of the patient's impairments (multi-system involvement);For patient/therapist safety PT goals addressed during session: Mobility/safety with mobility;Balance OT goals  addressed during session: Strengthening/ROM       AM-PAC PT "6 Clicks" Daily Activity  Outcome Measure Difficulty turning over in bed (including adjusting bedclothes, sheets and blankets)?: A Lot Difficulty moving from lying on back to sitting on the side of the bed? : Unable Difficulty sitting down on and standing up from a chair with arms (e.g., wheelchair, bedside commode, etc,.)?: Unable Help needed moving to and from a bed to chair (including a wheelchair)?: Total Help needed walking in hospital room?: Total Help needed climbing 3-5 steps with a railing? : Total 6 Click Score: 7    End of Session Equipment Utilized During Treatment: Gait belt Activity Tolerance: Patient tolerated treatment well Patient left: in chair;with call bell/phone within reach;with chair alarm set;with family/visitor present Nurse Communication: Mobility status;Need for lift equipment PT Visit Diagnosis: Unsteadiness on feet (R26.81);Muscle weakness (generalized) (M62.81);Other abnormalities of gait and mobility (R26.89);Other symptoms and signs involving the nervous system (R29.898)    Time: 1610-9604 PT Time Calculation (min) (ACUTE ONLY): 28 min   Charges:   PT Evaluation $PT Eval Moderate Complexity: 1 Mod     PT G Codes:        Delaney Meigs, PT [phone]   Lonzie Simmer B Cherron Blitzer 12/07/2017, 1:53 PM

## 2017-12-07 NOTE — Evaluation (Signed)
Occupational Therapy Evaluation Patient Details Name: Jaime FuelRichard A Maher MRN: [id number] DOB: 17-Jun-1946 Today's Date: 12/07/2017    History of Present Illness 72 yo admitted with new onset LUE tingling and weakness CT of head showed intraparenchymal hematoma right temporal and parietal lobes with small amount of edema.  Old/subacute hemorrhagic insult to the Lt  occipital lobe. and old hematoma  L parietal lobe noted.  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 He demonstrates the below listed deficits and will benefit from continued OT to maximize safety and independence with BADLs.  Pt [facility] and now with new Lt hemiparesis with impaired proprioception as well as impaired cognition, perception, balance, activity tolerance.  He currently requires max - total A for ADLs.  PTA, he lived with wife and was walking using RW with min guard asisst and required min guard to min A for ADLs.   Wife is very supportive.  Recommend CIR as he will need intensive therapies as well as consistency of therapists to allow him to achieve max level of function.  Will follow acutely.       Follow Up Recommendations  CIR;Supervision/Assistance - 24 hour    Equipment Recommendations  3 in 1 bedside commode;Wheelchair (measurements OT);Wheelchair cushion (measurements OT)    Recommendations for Other Services Rehab consult     Precautions / Restrictions Precautions Precautions: Fall Precaution Comments: impaired procioceptive awareness bil.       Mobility Bed Mobility Overal bed mobility: Needs Assistance Bed Mobility: Supine to Sit     Supine to sit: Mod assist     General bed mobility comments: Pt requires min A to lift shoulders from bed and mod A to scoot hip forward - he was unable to motor plan movement and required step  by step cuing   Transfers Overall transfer level: Needs assistance   Transfers: Sit to/from Stand;Stand Pivot Transfers Sit to Stand: Max assist;+2 physical assistance Stand pivot transfers: Max assist;+2 physical assistance       General transfer comment: Pt stood x 2.  First attempt, pt required assist to lift hips from bed, extend hips and knees as well as extend trunk.  He demonstrated signficant difficulty maintaining standing with max facilitation given due to poor proprioceptive awareness.  He returned to sitting.  RW used on second attempt.  He required assist to lift hips, and extend hips and knees.  He was able to progress to static standing with mod A. He was able to keep Rt hand on walker after it was placed there, howevere, unable to maintain Lt UE on walk.  He was unable to shift weight and required max assist to sweight shift to Lt in order to pivot to Rt.  He was unable to maintain controlled posture or to pivot feet or  hips and required max A +2 to transfer into chair    Balance Overall balance assessment: Needs assistance Sitting-balance support: Feet supported Sitting balance-Leahy Scale: Fair     Standing balance support: Single extremity supported Standing balance-Leahy Scale: Poor Standing balance comment: Pt initially required max A +2 with facilitation/assist for hip and knee extension, as well as extension of trunk.  Second attempt at standing, he was able to progress to mod A +1 for brief period - requires facilitation/assist for hip, knee and trunk extension as well  as placement of UEs                            ADL either performed or assessed with clinical judgement   ADL Overall ADL's : Needs assistance/impaired Eating/Feeding: Maximal assistance;Sitting Eating/Feeding Details (indicate cue type and reason): requires hand over hand assist with Lt UE  Grooming: Wash/dry hands;Wash/dry face;Oral care;Brushing hair;Maximal assistance;Sitting    Upper Body Bathing: Maximal assistance;Sitting   Lower Body Bathing: Maximal assistance;Sit to/from stand   Upper Body Dressing : Maximal assistance;Sitting   Lower Body Dressing: Total assistance;Sit to/from stand   Toilet Transfer: Maximal assistance;+2 for physical assistance;Squat-pivot;BSC   Toileting- Clothing Manipulation and Hygiene: Total assistance;Sit to/from stand       Functional mobility during ADLs: Maximal assistance;+2 for physical assistance;Rolling walker General ADL Comments: Pt with significant impairments bil. UEs which impact his ability to use UEs functionally      Vision   Additional Comments: Vision to be further assessed      Perception     Praxis Praxis Praxis tested?: Deficits Deficits: Ideomotor;Organization    Pertinent Vitals/Pain Pain Assessment: No/denies pain     Hand Dominance Right(although has been using Lt as dominant since prior CVA )   Extremity/Trunk Assessment Upper Extremity Assessment Upper Extremity Assessment: RUE deficits/detail;LUE deficits/detail RUE Deficits / Details: Significant tremor noted Rt UE with mod spasticity (wife reports this is worse than it was PTA).   He is able to use Rt UE as a gross assist  with assistance  RUE Sensation: decreased proprioception RUE Coordination: decreased fine motor;decreased gross motor LUE Deficits / Details: Pt with ~110* shoulder flexion; elbow flex/ext [facility] actively.  He demonstrates gross grasp and release.  He is unable to opose digits dysmetria noted.  Proprioception appears absent as does stereognosis.  He is unable to grade amount of force used/needed to hold onto objects  LUE Sensation: decreased proprioception LUE Coordination: decreased fine motor;decreased gross motor   Lower Extremity Assessment Lower Extremity Assessment: Defer to PT evaluation   Cervical / Trunk Assessment Cervical / Trunk Assessment: Other exceptions Cervical / Trunk Exceptions: Impaired  sensation.  impaired proprioception resulting in decreased ability to isolate trunk movement    Communication Communication Communication: No difficulties   Cognition Arousal/Alertness: Awake/alert Behavior During Therapy: WFL for tasks assessed/performed Overall Cognitive Status: Impaired/Different from baseline Area of Impairment: Attention;Following commands;Safety/judgement;Awareness;Problem solving                   Current Attention Level: Selective;Alternating   Following Commands: Follows one step commands consistently;Follows multi-step commands with increased time Safety/Judgement: Decreased awareness of deficits;Decreased awareness of safety Awareness: Emergent Problem Solving: Slow processing;Decreased initiation;Difficulty sequencing;Requires verbal cues;Requires tactile cues General Comments: severe sensory loss impacts his ability to initiate, sequence and execute activity - difficult to piece out cognitive deficits accurately    General Comments  VSS.  Wife present throughout     Exercises     Shoulder Instructions      Home Living Family/patient expects to be discharged to:: Private residence Living Arrangements: Spouse/significant other Available Help at Discharge: Family;Available 24 hours/day Type of Home: House Home Access: Stairs to enter [employer] of Steps: 2 Entrance Stairs-Rails: None Home Layout: One level     Bathroom Shower/Tub: Producer, television/film/video: Standard Bathroom Accessibility: Yes How Accessible: Accessible via walker Home Equipment: Shower seat - built in;Walker - 2 wheels   Additional Comments: liked to  play golf      Prior Functioning/Environment Level of Independence: Needs assistance  Gait / Transfers Assistance Needed: Pt was ambulatory with Min guard assist using RW  ADL's / Homemaking Assistance Needed: Wife reports she assisted him with bathing peri area, but he was able to perform all other  ADLs with min guard assist             OT Problem List: Decreased strength;Decreased range of motion;Decreased activity tolerance;Impaired balance (sitting and/or standing);Impaired vision/perception;Decreased coordination;Decreased cognition;Decreased safety awareness;Decreased knowledge of use of DME or AE;Impaired sensation;Impaired tone;Impaired UE functional use      OT Treatment/Interventions: Self-care/ADL training;Neuromuscular education;DME and/or AE instruction;Manual therapy;Therapeutic activities;Cognitive remediation/compensation;Visual/perceptual remediation/compensation;Patient/family education;Balance training    OT Goals(Current goals can be found in the care plan section) Acute Rehab OT Goals Patient Stated Goal: to regain independence  OT Goal Formulation: With patient/family Time For Goal Achievement: 12/21/17 Potential to Achieve Goals: Good ADL Goals Pt Will Perform Eating: with min assist;with adaptive utensils;sitting Pt Will Perform Grooming: with min assist;sitting;with adaptive equipment Pt Will Perform Upper Body Bathing: with mod assist;sitting Pt Will Transfer to Toilet: with mod assist;squat pivot transfer;bedside commode  OT Frequency: Min 2X/week   Barriers to D/C:            Co-evaluation PT/OT/SLP Co-Evaluation/Treatment: Yes Reason for Co-Treatment: Complexity of the patient's impairments (multi-system involvement);Necessary to address cognition/behavior during functional activity;For patient/therapist safety;To address functional/ADL transfers   OT goals addressed during session: Strengthening/ROM      AM-PAC PT "6 Clicks" Daily Activity     Outcome Measure Help from another person eating meals?: A Lot Help from another person taking care of personal grooming?: A Lot Help from another person toileting, which includes using toliet, bedpan, or urinal?: A Lot Help from another person bathing (including washing, rinsing, drying)?: A Lot Help  from another person to put on and taking off regular upper body clothing?: A Lot Help from another person to put on and taking off regular lower body clothing?: Total 6 Click Score: 11   End of Session Equipment Utilized During Treatment: Rolling walker;Gait belt Nurse Communication: Mobility status;Need for lift equipment  Activity Tolerance: Patient tolerated treatment well Patient left: in chair;with call bell/phone within reach;with chair alarm set;with family/visitor present  OT Visit Diagnosis: Hemiplegia and hemiparesis Hemiplegia - Right/Left: Right Hemiplegia - dominant/non-dominant: Non-Dominant;Dominant Hemiplegia - caused by: Cerebral infarction;Nontraumatic intracerebral hemorrhage                Time: 1130-1158 OT Time Calculation (min): 28 min Charges:  OT General Charges $OT Visit: 1 Visit OT Evaluation $OT Eval Moderate Complexity: 1 Mod G-Codes:     [employer], OTR/L [phone]   Jeani Hawking M 12/07/2017, 12:45 PM

## 2017-12-07 NOTE — Telephone Encounter (Signed)
Rn call wife cell phone. The daughter pick up the phone. The daughter stated she had her moms cell phone. The mom wanted her daughter to speak with Dr. Arthur HolmsSethi   Rn explain that per Dr. Pearlean BrownieSEthi when he is working in the clinic the pt families need to consult with the MD who are caring for her father. Rn stated Dr. Roda ShuttersXU another stroke MD has seen patient. The daughter stated " even though Dr. Pearlean BrownieSethi is primary neurologist, we would have to consult the MD caring for her father in the hospital". RN stated when Dr. Pearlean BrownieSethi is working in the clinic he pts families should consult the hospital MD. The daughter verbalized understanding.

## 2017-12-08 ENCOUNTER — Other Ambulatory Visit: Payer: Self-pay

## 2017-12-08 ENCOUNTER — Encounter: Payer: Self-pay | Admitting: Physical Therapy

## 2017-12-08 ENCOUNTER — Encounter: Payer: Self-pay | Admitting: Occupational Therapy

## 2017-12-08 LAB — BASIC METABOLIC PANEL
ANION GAP: 13 (ref 5–15)
BUN: 15 mg/dL (ref 6–20)
CO2: 20 mmol/L — AB (ref 22–32)
Calcium: 9.2 mg/dL (ref 8.9–10.3)
Chloride: 104 mmol/L (ref 101–111)
Creatinine, Ser: 0.75 mg/dL (ref 0.61–1.24)
GFR calc Af Amer: >60 mL/min (ref >60)
GFR calc non Af Amer: >60 mL/min (ref >60)
GLUCOSE: 118 mg/dL — AB (ref 65–99)
POTASSIUM: 4 mmol/L (ref 3.5–5.1)
Sodium: 137 mmol/L (ref 135–145)

## 2017-12-08 LAB — CBC
HEMATOCRIT: 42 % (ref 39.0–52.0)
HEMOGLOBIN: 14.1 g/dL (ref 13.0–17.0)
MCH: 30.5 pg (ref 26.0–34.0)
MCHC: 33.6 g/dL (ref 30.0–36.0)
MCV: 90.7 fL (ref 78.0–100.0)
Platelets: 276 10*3/uL (ref 150–400)
RBC: 4.63 MIL/uL (ref 4.22–5.81)
RDW: 14.9 % (ref 11.5–15.5)
WBC: 10.5 10*3/uL (ref 4.0–10.5)

## 2017-12-08 MED ORDER — BACLOFEN 10 MG PO TABS
20.0000 mg | ORAL_TABLET | Freq: Once | ORAL | Status: DC
  Filled 2017-12-08: qty 2

## 2017-12-08 MED ORDER — MOMETASONE FURO-FORMOTEROL FUM 200-5 MCG/ACT IN AERO: 2.0000 | INHALATION_SPRAY | Freq: Two times a day (BID) | RESPIRATORY_TRACT | Status: DC

## 2017-12-08 MED ORDER — MOMETASONE FURO-FORMOTEROL FUM 200-5 MCG/ACT IN AERO
2.0000 | INHALATION_SPRAY | Freq: Two times a day (BID) | RESPIRATORY_TRACT | Status: DC
  Administered 2017-12-08 – 2017-12-10 (×3): 2 via RESPIRATORY_TRACT
  Filled 2017-12-08: qty 8.8

## 2017-12-08 NOTE — Telephone Encounter (Signed)
Agree with plan 

## 2017-12-08 NOTE — Progress Notes (Signed)
Patient complaining of muscle spasms mostly to right leg and abdomen and happening more frequent than normal . Paged Dr Laurence SlateAroor. Awaiting call back

## 2017-12-08 NOTE — Clinical Social Work Note (Signed)
Clinical Social Work Assessment  Patient Details  Name: Jaime Pierce MRN: [id number] Date of Birth: Mar 31, 1946  Date of referral:  12/08/17               Reason for consult:  Facility Placement                Permission sought to share information with:  Facility Industrial/product designerContact Representative Permission granted to share information::  Yes, Verbal Permission Granted  Name::        Agency::  SNF  Relationship::  dtr  Contact Information:     Housing/Transportation Living arrangements for the past 2 months:  Single Family Home Source of Information:  Patient, Adult Children Patient Interpreter Needed:  None Criminal Activity/Legal Involvement Pertinent to Current Situation/Hospitalization:  No - Comment as needed Significant Relationships:  Adult Children, Spouse Lives with:  Spouse Do you feel safe going back to the place where you live?  No Need for family participation in patient care:  No (Coment)  Care giving concerns:  Pt lives at home with spouse- pt does not think his wife could manage his current needs at home.   Social Worker assessment / plan:  CSW spoke with pt and pt dtr concerning SNF vs CIR.  Pt was at CIR back in November and had great experience so familiar with program.  CSW discussed SNF as alternative option if CIR not approved.  Employment status:  Retired Database administratornsurance information:  Managed Medicare PT Recommendations:  Skilled Nursing Facility Information / Referral to community resources:  Skilled Nursing Facility  Patient/Family's Response to care:  Pt family has strong preference for CIR but agreeable to SNF as back up since pt too weak to return home.  Patient/Family's Understanding of and Emotional Response to Diagnosis, Current Treatment, and Prognosis:  Pt seems to have good understanding of condition at this time- hopeful for quick recovery at CIR.  Emotional Assessment Appearance:  Appears stated age Attitude/Demeanor/Rapport:    Affect (typically  observed):  Appropriate Orientation:  Oriented to Self, Oriented to Place, Oriented to  Time, Oriented to Situation Alcohol / Substance use:  Not Applicable Psych involvement (Current and /or in the community):  No (Comment)  Discharge Needs  Concerns to be addressed:  Care Coordination Readmission within the last 30 days:  No Current discharge risk:  Physical Impairment Barriers to Discharge:  Continued Medical Work up   Burna SisUris, Rasheen Bells H, LCSW 12/08/2017, 3:00 PM

## 2017-12-08 NOTE — Progress Notes (Signed)
Inpatient Rehabilitation  Met with patient, spouse, and daughter at bedside to discuss team's recommendation for IP Rehab.  Shared booklets, insurance verification letter, and answered questions.  I have initiated insurance authorization.  Plan to follow for timing of medical readiness, insurance authorization, and IP Rehab bed availability.  Call if questions.    Carmelia Roller., CCC/SLP Admission Coordinator  [facility]  Cell [phone]

## 2017-12-08 NOTE — Plan of Care (Signed)
Pt educated on the process and the NIH scale. Wife at bedside supportive and attentive.

## 2017-12-08 NOTE — Care Management Note (Signed)
Case Management Note  Patient Details  Name: Jaime Pierce MRN: [id number] Date of Birth: 01/04/1946  Subjective/Objective:      Pt admitted with ICH. He is from home with his spouse.               Action/Plan: Recommendations are for CIR. Awaiting consult. CM following for d/c disposition.   Expected Discharge Date:                  Expected Discharge Plan:  IP Rehab Facility  In-House Referral:     Discharge planning Services  CM Consult  Post Acute Care Choice:    Choice offered to:     DME Arranged:    DME Agency:     HH Arranged:    HH Agency:     Status of Service:  In process, will continue to follow  If discussed at Long Length of Stay Meetings, dates discussed:    Additional Comments:  Kermit BaloKelli F Blane Worthington, RN 12/08/2017, 11:07 AM

## 2017-12-08 NOTE — Progress Notes (Signed)
Call back received from Dr Aroor. Orders for 20mg  of Baclofen recieved

## 2017-12-08 NOTE — Therapy (Signed)
[facility] [address] [place], [place], [address] Phone: [phone]   Fax:  [phone]  Patient Details  Name: Jaime Pierce MRN: [id number] Date of Birth: [birth year] Referring Provider:  No ref. provider found  Encounter Date: 12/08/2017  PHYSICAL THERAPY DISCHARGE SUMMARY  Visits from Start of Care: 18  Current functional level related to goals / functional outcomes: Unable to assess final functional outcome and progress towards LTG due to pt recently hospitalized for new, acute hemorrhage affecting L side.  Once D/C home and cleared by physician pt to return with new PT eval and treat referral.   Remaining deficits: Hemiplegia, impaired motor planning/sequencing, impaired postural control, balance, gait and functional endurance   Education / Equipment: HEP  Plan: Patient agrees to discharge.  Patient goals were not met. Patient is being discharged due to a change in medical status.  ?????     Rico Junker, PT, DPT 12/08/17    4:24 PM    [facility] [address] [place], [place], [address] Phone: [phone]   Fax:  [phone]

## 2017-12-08 NOTE — Progress Notes (Signed)
Physical Therapy Treatment Patient Details Name: Jaime Pierce MRN: [id number] DOB: August 04, 1946 Today's Date: 12/08/2017    History of Present Illness 72 yo admitted with new onset LUE tingling and weakness CT of head showed intraparenchymal hematoma right temporal and parietal lobes with small amount of edema.  Old/subacute hemorrhagic insult to the Lt  occipital lobe. and old hematoma  L parietal lobe noted.  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  We also determined that the steady (bari) would be safe for RN staff to use to get him back to bed later.  He remains an excellent inpatient rehab candidate and has great support from his wife.     Follow Up Recommendations  CIR;Supervision/Assistance - 24 hour     Equipment Recommendations  Wheelchair cushion (measurements PT);Wheelchair (measurements PT)    Recommendations for Other Services Rehab consult     Precautions / Restrictions Precautions Precautions: Fall Precaution Comments: impaired procioceptive awareness bil.     Mobility  Bed Mobility Overal bed mobility: Needs Assistance Bed Mobility: Rolling;Sidelying to Sit Rolling: Min assist Sidelying to sit: Mod assist       General bed mobility comments: Min assist to help pt roll to his right side and push up to sitting from side lying.  Manual facilitation to initiate task find bed rail and sequence up to sitting EOB.   Transfers Overall transfer level: Needs assistance Equipment used: None Transfers: Sit to/from [employer] CorporationStand;Squat Pivot Transfers Sit to Stand: Mod assist;From elevated surface   Squat pivot transfers: Mod assist;From elevated surface     General transfer  comment: Mod assist to stand x 3 twice EOB  and once from recliner chair up to steady (to practice).  Pt needed assist to weight shift forward over his feet and to extend hips and find midline once standing.  Right lateral and posterior lean.  We did a modified squat/stand picot to the right into the drop arm recliner chair.  Scooted first and then squatted over the last 1/2      Modified Rankin (Stroke Patients Only) Modified Rankin (Stroke Patients Only) Pre-Morbid Rankin Score: Moderate disability Modified Rankin: Severe disability     Balance Overall balance assessment: Needs assistance Sitting-balance support: Feet supported;No upper extremity supported Sitting balance-Leahy Scale: Fair Sitting balance - Comments: supervision EOB once feet squarely positioned on the floor.  Postural control: Posterior lean;Right lateral lean Standing balance support: No upper extremity supported Standing balance-Leahy Scale: Poor Standing balance comment: one person mod assist to maintain standing EOB with support at trunk and hips for extension.                             Cognition Arousal/Alertness: Awake/alert Behavior During Therapy: WFL for tasks assessed/performed Overall Cognitive Status: History of cognitive impairments - at baseline                                 General Comments: Slow to process and respond motorocally.  Other cognitive deficits are close to his baseline PTA.  Pertinent Vitals/Pain Pain Assessment: No/denies pain(denies pain, but left LE is hypersensative to touch)           PT Goals (current goals can now be found in the care plan section) Acute Rehab PT Goals Patient Stated Goal: to regain independence  Progress towards PT goals: Progressing toward goals    Frequency    Min 4X/week      PT Plan Current plan remains appropriate       AM-PAC PT "6 Clicks" Daily Activity  Outcome Measure  Difficulty  turning over in bed (including adjusting bedclothes, sheets and blankets)?: Unable Difficulty moving from lying on back to sitting on the side of the bed? : Unable Difficulty sitting down on and standing up from a chair with arms (e.g., wheelchair, bedside commode, etc,.)?: Unable Help needed moving to and from a bed to chair (including a wheelchair)?: A Lot Help needed walking in hospital room?: Total Help needed climbing 3-5 steps with a railing? : Total 6 Click Score: 7    End of Session   Activity Tolerance: Patient tolerated treatment well Patient left: in chair;with call bell/phone within reach;with family/visitor present Nurse Communication: Mobility status;Need for lift equipment(use the bari steady) PT Visit Diagnosis: Unsteadiness on feet (R26.81);Muscle weakness (generalized) (M62.81);Other abnormalities of gait and mobility (R26.89);Other symptoms and signs involving the nervous system (Z61.096)     Time: 0454-0981 PT Time Calculation (min) (ACUTE ONLY): 35 min  Charges:  $Therapeutic Activity: 8-22 mins $Neuromuscular Re-education: 8-22 mins          Haskell Rihn B. Mayra Brahm, PT, DPT [phone]            12/08/2017, 6:08 PM

## 2017-12-08 NOTE — NC FL2 (Signed)
[place] MEDICAID FL2 LEVEL OF CARE SCREENING TOOL     IDENTIFICATION  Patient Name: Jaime Pierce Birthdate: 06/30/1946 Sex: male Admission Date (Current Location): 12/06/2017  [facility] [place]   Facility and Address:  [facility], [address], [place], [place] [address]      Provider Number: [id number]  Attending Physician Name and Address:  Marvel PlanXu, Jindong, MD  Relative Name and Phone Number:       Current Level of Care: Hospital Recommended Level of Care: Skilled Nursing Facility Prior Approval Number:    Date Approved/Denied:   PASRR Number: [id number]  Discharge Plan: SNF    Current Diagnoses: Patient Active Problem List   Diagnosis Date Noted  . ICH (intracerebral hemorrhage) (HCC) 12/06/2017  . Partial seizure (HCC) 11/08/2017  . Positive colorectal cancer screening using Cologuard test 10/22/2017  . Hemiparesis affecting right side as late effect of cerebrovascular accident (CVA) (HCC)   . Cough   . 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 Pain   . Hyponatremia   . Benign essential HTN   . 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 Gait disturbance, post-stroke   . Aphasia as late effect of stroke   . SAH (subarachnoid hemorrhage) (HCC) 09/02/2017  . B12 deficiency 09/02/2017  . Well adult exam 06/16/2016  . Cigarette smoker 02/23/2016  . COPD GOLD II  12/24/2014  . CAD (coronary artery disease) 07/22/2011  . HTN (hypertension) 07/22/2011  . Dyslipidemia 07/22/2011  . Smoker 07/22/2011    Orientation RESPIRATION BLADDER Height & Weight     Self, Time, Situation, Place  Normal Continent Weight: 179 lb 7.3 oz (81.4 kg) Height:  6' (182.9 cm)  BEHAVIORAL SYMPTOMS/MOOD NEUROLOGICAL BOWEL NUTRITION STATUS      Continent Diet  AMBULATORY STATUS COMMUNICATION OF NEEDS Skin   Extensive Assist Verbally Normal                       Personal Care Assistance Level of Assistance  Bathing, Dressing Bathing Assistance: Maximum assistance   Dressing Assistance: Maximum assistance     Functional Limitations Info             SPECIAL CARE FACTORS FREQUENCY  PT (By licensed PT), OT (By licensed OT)     PT Frequency: 5/wk OT Frequency: 5/wk            Contractures      Additional Factors Info  Code Status, Allergies Code Status Info: FULL Allergies Info: Sulfonamide Derivatives           Current Medications (12/08/2017):  This is the current hospital active medication list Current Facility-Administered Medications  Medication Dose Route Frequency Provider Last Rate Last Dose  .  stroke: mapping our early stages of recovery book   Does not apply Once Ulice DashSmith, David R, PA-C      . acetaminophen (TYLENOL) tablet 650 mg  650 mg Oral Q4H PRN Ulice DashSmith, David R, PA-C       Or  . acetaminophen (TYLENOL) solution 650 mg  650 mg Per Tube Q4H PRN Ulice DashSmith, David R, PA-C       Or  . acetaminophen (TYLENOL) suppository 650 mg  650 mg Rectal Q4H PRN Ulice DashSmith, David R, PA-C      . acetaminophen (TYLENOL) tablet 500 mg  500 mg Oral Q6H PRN Ulice DashSmith, David R, PA-C   500 mg  at 12/06/17 1612  . baclofen (LIORESAL) tablet 20 mg  20 mg Oral TID Ulice Dash, PA-C   20 mg at 12/08/17 1610  . divalproex (DEPAKOTE) DR tablet 500 mg  500 mg Oral Q12H Marvel Plan, MD   500 mg at 12/08/17 0914  . pantoprazole (PROTONIX) EC tablet 40 mg  40 mg Oral Daily Marvel Plan, MD   40 mg at 12/08/17 0915  . senna-docusate (Senokot-S) tablet 1 tablet  1 tablet Oral BID Ulice Dash, PA-C   1 tablet at 12/08/17 9604     Discharge Medications: Please see discharge summary for a list of discharge medications.  Relevant Imaging Results:  Relevant Lab Results:   Additional Information SS#: [id number]  Burna Sis, LCSW

## 2017-12-08 NOTE — Progress Notes (Signed)
STROKE TEAM PROGRESS NOTE   SUBJECTIVE (INTERVAL HISTORY) His wife and daughter are at the bedside. Pt awake alert AAOx3, still has mainly left UE weakness, LLE strong. CT Head stable. 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 Answered all questions asked by family at bedside. B/P and Labs stable  OBJECTIVE Temp:  [97.6 F (36.4 C)-99 F (37.2 C)] 99 F (37.2 C) (01/16 1358) Pulse Rate:  [78-88] 78 (01/16 1358) Cardiac Rhythm: Normal sinus rhythm;Heart block (01/16 0700) Resp:  [18-20] 18 (01/16 1358) BP: (112-133)/(60-71) 112/68 (01/16 1358) SpO2:  [93 %-100 %] 100 % (01/16 1358) Weight:  [81.4 kg (179 lb 7.3 oz)] 81.4 kg (179 lb 7.3 oz) (01/15 2120)  Recent Labs  Lab 12/06/17 1419 12/07/17 1218  GLUCAP 99 103*   Recent Labs  Lab 12/06/17 0942 12/06/17 1002 12/07/17 0922 12/08/17 0825  NA 138 141 137 137  K 4.3 4.3 4.1 4.0  CL 102 100* 104 104  CO2 23  --  21* 20*  GLUCOSE 125* 128* 118* 118*  BUN 15 18 15 15   CREATININE 0.82 0.70 0.78 0.75  CALCIUM 9.8  --  9.4 9.2   Recent Labs  Lab 12/06/17 0942  AST 19  ALT 17  ALKPHOS 59  BILITOT 1.1  PROT 7.3  ALBUMIN 3.8   Recent Labs  Lab 12/06/17 0942 12/06/17 1002 12/07/17 0922 12/08/17 0825  WBC 11.6*  --  10.7* 10.5  NEUTROABS 8.7*  --   --   --   HGB 16.0 17.0 14.6 14.1  HCT 47.1 50.0 43.4 42.0  MCV 92.4  --  90.6 90.7  PLT 307  --  265 276   Recent Labs    12/06/17 0942  LABPROT 12.9  INR 0.98   Recent Labs    12/06/17 1500  COLORURINE AMBER*  LABSPEC 1.018  PHURINE 7.0  GLUCOSEU NEGATIVE  HGBUR NEGATIVE  BILIRUBINUR NEGATIVE  KETONESUR NEGATIVE  PROTEINUR NEGATIVE  NITRITE NEGATIVE  LEUKOCYTESUR NEGATIVE       Component Value Date/Time   CHOL 131 09/02/2017 0314   TRIG 73 09/02/2017 0314   TRIG 65 09/21/2006 0832   HDL 35 (L) 09/02/2017 0314   CHOLHDL 3.7 09/02/2017 0314   VLDL 15 09/02/2017 0314   LDLCALC 81 09/02/2017 0314   Lab Results  Component  Value Date   HGBA1C 5.9 (H) 09/02/2017      Component Value Date/Time   LABOPIA NONE DETECTED 12/06/2017 1506   COCAINSCRNUR NONE DETECTED 12/06/2017 1506   LABBENZ NONE DETECTED 12/06/2017 1506   AMPHETMU NONE DETECTED 12/06/2017 1506   THCU NONE DETECTED 12/06/2017 1506   LABBARB NONE DETECTED 12/06/2017 1506    Recent Labs  Lab 12/06/17 0950  ETH <10   I have personally reviewed the radiological images below and agree with the radiology interpretations.  Ct Head Wo Contrast Result Date: 12/07/2017 IMPRESSION: 1. Evolving acute RIGHT temporal and RIGHT parietal lobe intraparenchymal hematomas. Regional mass effect without midline shift. 2. Small volume RIGHT subarachnoid hemorrhage. 3. Subacute to old LEFT parietal LEFT occipital lobe hematomas. 4. Constellation of findings suggest amyloid angiopathy. Electronically Signed   By: Awilda Metroourtnay  Bloomer M.D.   On: 12/07/2017 01:12   Ct Head Wo Contrast  Result Date: 12/06/2017 CLINICAL DATA:  Left arm and hand numbness beginning yesterday. Old stroke with right-sided weakness. EXAM: CT HEAD WITHOUT CONTRAST TECHNIQUE: Contiguous axial images were obtained from the base of the skull through the vertex without intravenous contrast.  COMPARISON:  CT 11/27/2017. MRI 11/21/2017. multiple previous examinations 2,018. FINDINGS: Brain: No abnormality seen affecting the brainstem or cerebellum. In the left hemisphere, there is an old/subacute hemorrhagic insult in the occipital lobe which is undergoing expected evolutionary changes without evidence of new bleeding. Old hematoma in the left parietal cortical and subcortical region as seen previously, measuring about 3 cm in diameter, undergoing expected evolutionary changes. No evidence of enlargement or new bleeding. Mild chronic small-vessel ischemic changes elsewhere within the left hemispheric white matter. In the right hemisphere, there is a new intraparenchymal hemorrhage in the right inferior temporal  lobe measuring approximately 2 x 2 x 1 cm, volume 2 cc. Mild surrounding edema suggests that this is subacute. Second new intraparenchymal hematoma in the peripheral right temporal lobe measuring approximately 2 x 2.5 by 2 cm, volume 5 cc. Small amount of surrounding edema as well. Some adjacent subarachnoid blood in nearby sulci, probably secondary to penetration at this site. Small old right frontal cortical and subcortical infarction. Chronic small-vessel ischemic changes throughout the white matter. No evidence of hydrocephalus or intraventricular blood. Vascular: There is atherosclerotic calcification of the major vessels at the base of the brain. Skull: Negative Sinuses/Orbits: Clear/normal Other: None significant IMPRESSION: Newly seen intraparenchymal hemorrhages in the right inferior temporal lobe and right parietal lobe, with some subarachnoid blood in the sulci near the right parietal hematoma. 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 No evidence of mass effect or shift. These results were called by telephone at the time of interpretation on 12/06/2017 at 10:54 am to Dr. Doug Sou , who verbally acknowledged these results. Electronically Signed   By: Paulina Fusi M.D.   On: 12/06/2017 11:00   Ct Head Wo Contrast  Result Date: 11/27/2017 CLINICAL DATA:  Episode of numbness to LEFT hand lasting 5 minutes yesterday, today with numbness in LEFT hand, LEFT arm, LEFT ear and jaw lasting 10 minutes which has since resolved, history of stroke in October 2018, coronary artery disease, hypertension, hyperlipidemia, former smoker EXAM: CT HEAD WITHOUT CONTRAST TECHNIQUE: Contiguous axial images were obtained from the base of the skull through the vertex without intravenous contrast. Sagittal and coronal MPR images reconstructed from axial data set.  COMPARISON:  10/23/2017 ; interval MR brain 11/21/2017 FINDINGS: Brain: Generalized atrophy. Normal ventricular morphology. No midline shift or mass effect. Small vessel chronic ischemic changes of deep cerebral white matter. Again identified area of infarction at the high LEFT parietal region with interval resolution of the high attenuation hemorrhagic focus seen on the previous exam. New area of low attenuation at the LEFT occipital lobe since the prior CT corresponding to hemorrhagic infarct identified on interval MR. No additional intracranial hemorrhage, mass lesion, evidence of acute infarction, or extra-axial fluid collection. Vascular: No hyperdense vessels. Mild atherosclerotic calcification of internal carotid arteries at skullbase Skull: Intact Sinuses/Orbits: Minimal fluid or mucus dependently in sphenoid sinus. Minimal mucosal thickening or small mucosal retention cyst in a RIGHT ethmoid air cell. Remaining visualized paranasal sinuses and mastoid air cells clear. Nasal septal deviation to the RIGHT. Other: N/A IMPRESSION: Atrophy with small vessel chronic ischemic changes of deep cerebral white matter. Evolving high LEFT posterior parietal infarct with resolution of the high attenuation hematoma component since previous study. Evolving hemorrhagic infarct at LEFT occipital lobe. No new intracranial abnormalities. Electronically Signed   By: Ulyses Southward  M.D.   On: 11/27/2017 19:31   [name] UJ Contrast  Result Date: 11/23/2017  [facility] [address] [place], [place] [address] [phone] NEUROIMAGING REPORT STUDY DATE: 11/21/2017 PATIENT NAME: NED KAKAR DOB: 1946/07/04 MRN: [id number] EXAM: MRI Brain with and without contrast ORDERING CLINICIAN: Delia Heady M.D. CLINICAL HISTORY: 72 year old man with history of intracranial hemorrhage COMPARISON FILMS: MRI 09/01/2017 TECHNIQUE:MRI of the brain with and without contrast was obtained utilizing 5 mm axial  slices with T1, T2, T2 flair, SWI and diffusion weighted views.  T1 sagittal, T2 coronal and postcontrast views in the axial and coronal plane were obtained. CONTRAST: 15 ml [facility] IMAGING SITE: [facility], [address] [place]. FINDINGS: On sagittal images, the spinal cord is imaged caudally to C2-C3 and is normal in caliber.   The contents of the posterior fossa are of normal size and position.   The pituitary gland and optic chiasm appear normal.    There is moderate generalized cortical atrophy.  There are no abnormal extra-axial collections of fluid.  There is a 43 x 22 x 38 mm (transverse, AP, height) sub-chronic left parietal lobe hemorrhage. Compared to the 09/01/2017 MRI, the hemorrhage is mildly smaller and there has been expected evolutionary changes with the presence of extracellular methemoglobin and development of a hemosiderin rim.  Additionally, there is a smaller subacute hemorrhage in the left occipital lobe measuring about 11 mm.  This hemorrhage is not appreciated on the 09/01/2017 MRI.   Elsewhere in the hemispheres, there are scattered T2/FLAIR hyperintense foci consistent with moderate chronic microvascular ischemic change.  The cerebellum and brainstem appears normal.   The deep gray matter appears normal.   Diffusion weighted images showed the hemorrhages.  Susceptibility weighted images show the 2 larger hemorrhages as well as several, predominantly subcortical, microhemorrhages.  The orbits appear normal.   The VIIth/VIIIth nerve complex appears normal.  The mastoid air cells appear normal.  The paranasal sinuses appear normal.  Flow voids are identified within the major intracerebral arteries.  A 13 mm T2 hyperintense focus is noted in the left parotid gland, unchanged compared to the previous MRI. After the infusion of contrast material, a normal enhancement pattern is noted.    This MRI of the brain with and without contrast shows the following: 1.    Large (432238 mm)  sub-chronic hemorrhage in the left parietal lobe showing expected evolutionary changes when compared to the 09/01/2017 MRI.    There is no abnormal enhancement. 2.    Smaller (11 mm) subacute hemorrhage in the left occipital lobe that was not present on the 09/01/2017 MRI. 3.    Few subcortical microhemorrhages.    Amyloid angiopathy cannot be ruled out but the number of microhemorrhages is much less than expected for that diagnosis. 4.    Moderate generalized cortical atrophy and chronic microvascular ischemic change. 5.    13 mm left carotid and T2 hypointense lesion. This is unchanged when compared to the previous MRI. INTERPRETING PHYSICIAN: Marcanthony A. Epimenio Foot, MD, PhD, FAAN Certified in  Neuroimaging by [employer]   PHYSICAL EXAM  Temp:  [97.6 F (36.4 C)-99 F (37.2 C)] 99 F (37.2 C) (01/16 1358) Pulse Rate:  [78-88] 78 (01/16 1358) Resp:  [18-20] 18 (01/16 1358) BP: (112-133)/(60-71) 112/68 (01/16 1358) SpO2:  [93 %-100 %] 100 % (01/16 1358) Weight:  [81.4 kg (179 lb 7.3 oz)] 81.4 kg (179 lb 7.3 oz) (01/15 2120)  General - Well  nourished, well developed, in no apparent distress. Cardiovascular - Regular rate and rhythm with no murmur.  Mental Status -  Level of arousal and orientation to time, place, and person were intact. Language including expression, naming, repetition, comprehension was assessed and found intact. Attention span and concentration were normal. Fund of Knowledge was assessed and was intact.  Cranial Nerves II - XII - II - Visual field intact OU. III, IV, VI - Extraocular movements intact. V - Facial sensation intact bilaterally. VII - Facial movement intact bilaterally. VIII - Hearing & vestibular intact bilaterally. X - Palate elevates symmetrically. XI - Chin turning & shoulder shrug intact bilaterally. XII - Tongue protrusion intact.  Motor Strength - The patient's strength was 3/5 RUE with limited ROM at right shoulder, 4-/5 LUE  proximal, 5/5 tricep and bicep, 3+/5 left hand grip with dexterity difficulty. BLE 4/5.  Bulk was normal and fasciculations were absent.   Motor Tone - Muscle tone was assessed at the neck and appendages and was normal. Reflexes - The patient's reflexes were symmetrical in all extremities and he had no pathological reflexes. Sensory - Light touch, temperature/pinprick were assessed and were symmetrical.   Coordination - The patient had ataxia with LUE for FTN.  Tremor was absent. Gait and Station - deferred.  ASSESSMENT/PLAN Mr. BESSIE LIVINGOOD is a 72 y.o. male with history of CAD s/p CABG, HLD, HTN, former smoker, recent ICH and seizure admitted for left arm weakness.   Acute ICH:  left frontal and left temporal ICH. 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 Yes; Fluid consistency: Thin   No antithrombotic prior to admission, now on No antithrombotic.   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 Transient focal neurologic episodes  10/23/17 - right facial twitching - partial seizure - started on keppra  11/10/17 - outpt saw Dr. Pearlean Brownie - EEG slowing no seizure - keppra changed to depakote due to intolerance  depakote level low at 30 on 12/07/2017  Change depakote to 500mg  bid 12/07/2017  No seizure activity reported since last seizure  BP  management  BP stable   BP goal < 140  Avoid hypertension  Other Stroke Risk Factors  Advanced age  former smoker  Other Active Problems  Spasticity on baclofen  COPD - [facility] day # 2   Brita Romp Stroke Neurology 12/08/2017 3:47 PM   I reviewed above note and agree with the assessment and plan. I have made any additions or clarifications directly to the above note. Pt was seen and examined. No acute change overnight. Wife and daughter reported 2-3 episodes of back and abdomen muscle jerking and spasm, lasting 2-3 min each. No LOC or confusion. Concerning for muscle spasm, vs. Simple partial seizure vs. Transient focal neurologic episodes. Continue depakote bid for seizure  control. PT/OT recommend CIR. Pt will follow up with Dr. Pearlean Brownie on 12/22/17.   Marvel Plan, MD PhD Stroke Neurology 12/08/2017 4:19 PM     To contact Stroke Continuity provider, please refer to [email or web address]. After hours, contact General Neurology

## 2017-12-09 ENCOUNTER — Inpatient Hospital Stay (HOSPITAL_COMMUNITY): Payer: Medicare HMO

## 2017-12-09 ENCOUNTER — Ambulatory Visit: Payer: Medicare HMO | Admitting: Physical Therapy

## 2017-12-09 ENCOUNTER — Ambulatory Visit: Payer: Medicare HMO

## 2017-12-09 ENCOUNTER — Ambulatory Visit: Payer: Medicare HMO | Admitting: Occupational Therapy

## 2017-12-09 DIAGNOSIS — R569 Unspecified convulsions: Secondary | ICD-10-CM

## 2017-12-09 DIAGNOSIS — I680 Cerebral amyloid angiopathy: Secondary | ICD-10-CM

## 2017-12-09 LAB — URINALYSIS, COMPLETE (UACMP) WITH MICROSCOPIC
BILIRUBIN URINE: NEGATIVE
Bacteria, UA: NONE SEEN
GLUCOSE, UA: NEGATIVE mg/dL
Hgb urine dipstick: NEGATIVE
Ketones, ur: 5 mg/dL — AB
LEUKOCYTES UA: NEGATIVE
NITRITE: NEGATIVE
PH: 5 (ref 5.0–8.0)
Protein, ur: NEGATIVE mg/dL
SPECIFIC GRAVITY, URINE: 1.019 (ref 1.005–1.030)

## 2017-12-09 LAB — BASIC METABOLIC PANEL
Anion gap: 13 (ref 5–15)
BUN: 17 mg/dL (ref 6–20)
CALCIUM: 8.9 mg/dL (ref 8.9–10.3)
CO2: 21 mmol/L — AB (ref 22–32)
Chloride: 102 mmol/L (ref 101–111)
Creatinine, Ser: 0.71 mg/dL (ref 0.61–1.24)
GFR calc non Af Amer: >60 mL/min (ref >60)
GLUCOSE: 112 mg/dL — AB (ref 65–99)
Potassium: 3.8 mmol/L (ref 3.5–5.1)
Sodium: 136 mmol/L (ref 135–145)

## 2017-12-09 LAB — CBC
HEMATOCRIT: 39 % (ref 39.0–52.0)
Hemoglobin: 13.4 g/dL (ref 13.0–17.0)
MCH: 31 pg (ref 26.0–34.0)
MCHC: 34.4 g/dL (ref 30.0–36.0)
MCV: 90.3 fL (ref 78.0–100.0)
Platelets: 257 10*3/uL (ref 150–400)
RBC: 4.32 MIL/uL (ref 4.22–5.81)
RDW: 14.4 % (ref 11.5–15.5)
WBC: 11.2 10*3/uL — ABNORMAL HIGH (ref 4.0–10.5)

## 2017-12-09 LAB — VALPROIC ACID LEVEL: VALPROIC ACID LVL: 63 ug/mL (ref 50.0–100.0)

## 2017-12-09 MED ORDER — LACOSAMIDE 200 MG PO TABS: 200.0000 mg | ORAL_TABLET | Freq: Once | ORAL | Status: DC

## 2017-12-09 MED ORDER — LACOSAMIDE 50 MG PO TABS
100.0000 mg | ORAL_TABLET | Freq: Two times a day (BID) | ORAL | Status: DC
  Administered 2017-12-10: 100 mg via ORAL
  Filled 2017-12-09: qty 2

## 2017-12-09 MED ORDER — LACOSAMIDE 200 MG/20ML IV SOLN
200.0000 mg | Freq: Once | INTRAVENOUS | Status: AC
  Administered 2017-12-09: 200 mg via INTRAVENOUS
  Filled 2017-12-09: qty 20

## 2017-12-09 NOTE — Progress Notes (Signed)
After working with OT/PT patient was laying in bed; his face began to twitch on the left side and his eyes became heavy; this lasted a few seconds; he is arousable and following commands presently; Dr.Xu called and orders received.

## 2017-12-09 NOTE — Progress Notes (Signed)
STAT EEG completed; results pending. Dr Roda ShuttersXu reviewed at bedside

## 2017-12-09 NOTE — Progress Notes (Signed)
STROKE TEAM PROGRESS NOTE   SUBJECTIVE (INTERVAL HISTORY) His wife is at the bedside. Pt awake alert AAOx3, still has mainly left UE weakness, LLE strong. Labs and vitals stable. Participating in therapies.  OBJECTIVE Temp:  [97.9 F (36.6 C)-100.5 F (38.1 C)] 97.9 F (36.6 C) (01/17 1412) Pulse Rate:  [81-91] 85 (01/17 1412) Cardiac Rhythm: Heart block (01/17 0700) Resp:  [18] 18 (01/17 1412) BP: (118-134)/(67-75) 134/71 (01/17 1412) SpO2:  [91 %-94 %] 94 % (01/17 1412)  Recent Labs  Lab 12/06/17 1419 12/07/17 1218  GLUCAP 99 103*   Recent Labs  Lab 12/06/17 0942 12/06/17 1002 12/07/17 0922 12/08/17 0825 12/09/17 0239  NA 138 141 137 137 136  K 4.3 4.3 4.1 4.0 3.8  CL 102 100* 104 104 102  CO2 23  --  21* 20* 21*  GLUCOSE 125* 128* 118* 118* 112*  BUN 15 18 15 15 17   CREATININE 0.82 0.70 0.78 0.75 0.71  CALCIUM 9.8  --  9.4 9.2 8.9   Recent Labs  Lab 12/06/17 0942  AST 19  ALT 17  ALKPHOS 59  BILITOT 1.1  PROT 7.3  ALBUMIN 3.8   Recent Labs  Lab 12/06/17 0942 12/06/17 1002 12/07/17 0922 12/08/17 0825 12/09/17 0239  WBC 11.6*  --  10.7* 10.5 11.2*  NEUTROABS 8.7*  --   --   --   --   HGB 16.0 17.0 14.6 14.1 13.4  HCT 47.1 50.0 43.4 42.0 39.0  MCV 92.4  --  90.6 90.7 90.3  PLT 307  --  265 276 257      Component Value Date/Time   CHOL 131 09/02/2017 0314   TRIG 73 09/02/2017 0314   TRIG 65 09/21/2006 0832   HDL 35 (L) 09/02/2017 0314   CHOLHDL 3.7 09/02/2017 0314   VLDL 15 09/02/2017 0314   LDLCALC 81 09/02/2017 0314   Lab Results  Component Value Date   HGBA1C 5.9 (H) 09/02/2017   I have personally reviewed the radiological images below and agree with the radiology interpretations.  Ct Head Wo Contrast Result Date: 12/07/2017 IMPRESSION: 1. Evolving acute RIGHT temporal and RIGHT parietal lobe intraparenchymal hematomas. Regional mass effect without midline shift. 2. Small volume RIGHT subarachnoid hemorrhage. 3. Subacute to old LEFT  parietal LEFT occipital lobe hematomas. 4. Constellation of findings suggest amyloid angiopathy. Electronically Signed   By: Awilda Metro M.D.   On: 12/07/2017 01:12   Ct Head Wo Contrast  Result Date: 12/06/2017 CLINICAL DATA:  Left arm and hand numbness beginning yesterday. Old stroke with right-sided weakness. EXAM: CT HEAD WITHOUT CONTRAST TECHNIQUE: Contiguous axial images were obtained from the base of the skull through the vertex without intravenous contrast. COMPARISON:  CT 11/27/2017. MRI 11/21/2017. multiple previous examinations 2,018. FINDINGS: Brain: No abnormality seen affecting the brainstem or cerebellum. In the left hemisphere, there is an old/subacute hemorrhagic insult in the occipital lobe which is undergoing expected evolutionary changes without evidence of new bleeding. Old hematoma in the left parietal cortical and subcortical region as seen previously, measuring about 3 cm in diameter, undergoing expected evolutionary changes. No evidence of enlargement or new bleeding. Mild chronic small-vessel ischemic changes elsewhere within the left hemispheric white matter. In the right hemisphere, there is a new intraparenchymal hemorrhage in the right inferior temporal lobe measuring approximately 2 x 2 x 1 cm, volume 2 cc. Mild surrounding edema suggests that this is subacute. Second new intraparenchymal hematoma in the peripheral right temporal lobe measuring approximately 2 x  2.5 by 2 cm, volume 5 cc. Small amount of surrounding edema as well. Some adjacent subarachnoid blood in nearby sulci, probably secondary to penetration at this site. Small old right frontal cortical and subcortical infarction. Chronic small-vessel ischemic changes throughout the white matter. No evidence of hydrocephalus or intraventricular blood. Vascular: There is atherosclerotic calcification of the major vessels at the base of the brain. Skull: Negative Sinuses/Orbits: Clear/normal Other: None significant  IMPRESSION: Newly seen intraparenchymal hemorrhages in the right inferior temporal lobe and right parietal lobe, with some subarachnoid blood in the sulci near the right parietal hematoma. 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 No evidence of mass effect or shift. These results were called by telephone at the time of interpretation on 12/06/2017 at 10:54 am to Dr. Doug Sou , who verbally acknowledged these results. Electronically Signed   By: Paulina Fusi M.D.   On: 12/06/2017 11:00   Ct Head Wo Contrast  Result Date: 11/27/2017 CLINICAL DATA:  Episode of numbness to LEFT hand lasting 5 minutes yesterday, today with numbness in LEFT hand, LEFT arm, LEFT ear and jaw lasting 10 minutes which has since resolved, history of stroke in October 2018, coronary artery disease, hypertension, hyperlipidemia, former smoker EXAM: CT HEAD WITHOUT CONTRAST TECHNIQUE: Contiguous axial images were obtained from the base of the skull through the vertex without intravenous contrast. Sagittal and coronal MPR images reconstructed from axial data set. COMPARISON:  10/23/2017 ; interval MR brain 11/21/2017 FINDINGS: Brain: Generalized atrophy. Normal ventricular morphology. No midline shift or mass effect. Small vessel chronic ischemic changes of deep cerebral white matter. Again identified area of infarction at the high LEFT parietal region with interval resolution of the high attenuation hemorrhagic focus seen on the previous exam. New area of low attenuation at the LEFT occipital lobe since the prior CT corresponding to hemorrhagic infarct identified on interval MR. No additional intracranial hemorrhage, mass lesion, evidence of acute infarction, or extra-axial fluid collection. Vascular: No hyperdense vessels. Mild atherosclerotic calcification of internal carotid  arteries at skullbase Skull: Intact Sinuses/Orbits: Minimal fluid or mucus dependently in sphenoid sinus. Minimal mucosal thickening or small mucosal retention cyst in a RIGHT ethmoid air cell. Remaining visualized paranasal sinuses and mastoid air cells clear. Nasal septal deviation to the RIGHT. Other: N/A IMPRESSION: Atrophy with small vessel chronic ischemic changes of deep cerebral white matter. Evolving high LEFT posterior parietal infarct with resolution of the high attenuation hematoma component since previous study. Evolving hemorrhagic infarct at LEFT occipital lobe. No new intracranial abnormalities. Electronically Signed   By: Ulyses Southward M.D.   On: 11/27/2017 19:31   [name] WU Contrast  Result Date: 11/23/2017  [facility] [address] [place], [place] [address] [phone] NEUROIMAGING REPORT STUDY DATE: 11/21/2017 PATIENT NAME: JAYLENN ALTIER DOB: 27-May-1946 MRN: [id number] EXAM: MRI Brain with and without contrast ORDERING CLINICIAN: Delia Heady M.D. CLINICAL HISTORY: 72 year old man with history of intracranial hemorrhage COMPARISON FILMS: MRI 09/01/2017 TECHNIQUE:MRI of the brain with and without contrast was obtained utilizing 5 mm axial slices with T1, T2, T2 flair, SWI and diffusion weighted views.  T1 sagittal, T2 coronal and postcontrast views in the axial and coronal plane were obtained. CONTRAST: 15 ml [employer] IMAGING SITE: [facility], [address] [place]. FINDINGS: On sagittal images, the spinal cord is imaged caudally to C2-C3 and  is normal in caliber.   The contents of the posterior fossa are of normal size and position.   The pituitary gland and optic chiasm appear normal.    There is moderate generalized cortical atrophy.  There are no abnormal extra-axial collections of fluid.  There is a 43 x 22 x 38 mm (transverse, AP, height) sub-chronic left parietal lobe hemorrhage. Compared to the 09/01/2017 MRI, the hemorrhage is mildly smaller  and there has been expected evolutionary changes with the presence of extracellular methemoglobin and development of a hemosiderin rim.  Additionally, there is a smaller subacute hemorrhage in the left occipital lobe measuring about 11 mm.  This hemorrhage is not appreciated on the 09/01/2017 MRI.   Elsewhere in the hemispheres, there are scattered T2/FLAIR hyperintense foci consistent with moderate chronic microvascular ischemic change.  The cerebellum and brainstem appears normal.   The deep gray matter appears normal.   Diffusion weighted images showed the hemorrhages.  Susceptibility weighted images show the 2 larger hemorrhages as well as several, predominantly subcortical, microhemorrhages.  The orbits appear normal.   The VIIth/VIIIth nerve complex appears normal.  The mastoid air cells appear normal.  The paranasal sinuses appear normal.  Flow voids are identified within the major intracerebral arteries.  A 13 mm T2 hyperintense focus is noted in the left parotid gland, unchanged compared to the previous MRI. After the infusion of contrast material, a normal enhancement pattern is noted.    This MRI of the brain with and without contrast shows the following: 1.    Large (432238 mm) sub-chronic hemorrhage in the left parietal lobe showing expected evolutionary changes when compared to the 09/01/2017 MRI.    There is no abnormal enhancement. 2.    Smaller (11 mm) subacute hemorrhage in the left occipital lobe that was not present on the 09/01/2017 MRI. 3.    Few subcortical microhemorrhages.    Amyloid angiopathy cannot be ruled out but the number of microhemorrhages is much less than expected for that diagnosis. 4.    Moderate generalized cortical atrophy and chronic microvascular ischemic change. 5.    13 mm left carotid and T2 hypointense lesion. This is unchanged when compared to the previous MRI. INTERPRETING PHYSICIAN: Kaiel A. Sater, MD, PhD, FAAN Certified in  Neuroimaging by [employer]  [employer]uro[employer]   PHYSICAL EXAM  Temp:  [97.9 F (36.6 C)-100.5 F (38.1 C)] 97.9 F (36.6 C) (01/17 1412) Pulse Rate:  [81-91] 85 (01/17 1412) Resp:  [18] 18 (01/17 1412) BP: (118-134)/(67-75) 134/71 (01/17 1412) SpO2:  [91 %-94 %] 94 % (01/17 1412)  General - Well nourished, well developed, in no apparent distress. Cardiovascular - Regular rate and rhythm with no murmur.  Mental Status -  Level of arousal and orientation to time, place, and person were intact. Language including expression, naming, repetition, comprehension was assessed and found intact. Attention span and concentration were normal. Fund of Knowledge was assessed and was intact.  Cranial Nerves II - XII - II - Visual field intact OU. III, IV, VI - Extraocular movements intact. V - Facial sensation intact bilaterally. VII - Facial movement intact bilaterally. VIII - Hearing & vestibular intact bilaterally. X - Palate elevates symmetrically. XI - Chin turning & shoulder shrug intact bilaterally. XII - Tongue protrusion intact.  Motor Strength - The patient's strength was 3/5 RUE with limited ROM at right shoulder, 4-/5 LUE proximal, 5/5 tricep and bicep, 3+/5 left hand grip with dexterity difficulty. BLE 4/5.  Bulk was normal  and fasciculations were absent.   Motor Tone - Muscle tone was assessed at the neck and appendages and was normal. Reflexes - The patient's reflexes were symmetrical in all extremities and he had no pathological reflexes. Sensory - Light touch, temperature/pinprick were assessed and were symmetrical.   Coordination - The patient had ataxia with LUE for FTN.  Tremor was absent. Gait and Station - deferred.  ASSESSMENT/PLAN Jaime Pierce is a 72 y.o. male with history of CAD s/p CABG, HLD, HTN, former smoker, recent ICH and seizure admitted for left arm weakness.   Acute ICH:  left frontal and left temporal ICH. 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 Yes; Fluid consistency: Thin   No antithrombotic prior to admission, now on No antithrombotic.   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 Transient focal neurologic episodes  10/23/17 - right facial twitching - partial seizure - started on keppra  11/10/17 - outpt saw Dr. Pearlean BrownieSethi - EEG slowing no seizure - keppra changed to depakote due to intolerance  depakote level low at 30 on 12/07/2017  Change depakote to 500mg  bid 12/07/2017  No seizure activity reported since last seizure  BP management  BP stable   BP goal < 140  Avoid hypertension  Other Stroke Risk Factors  Advanced age  former smoker  Other Active Problems  Spasticity on baclofen  [facility]   [facility] # [facility]   12/09/2017 Wife reports 1 very brief episode of back/abdomen muscle jerking/spasm, last PM. No LOC/confusion. No seizure activity reported per nursing. Continue depakote bid for seizure control. Pt will follow up with Dr. Pearlean BrownieSethi on 12/22/17. Awaiting insurance approval for CIR  Brita RompMary A Costello, ANP-C Stroke Neurology 12/09/2017 3:22 PM   ATTENDING NOTE: I reviewed above note and agree with the assessment and plan. I have made any additions or clarifications directly to the above note. Pt was seen and examined.   No acute change overnight. Reported again 1 episode of back and abdomen muscle  spasm. No LOC or confusion. Continue depakote bid for seizure control. PT/OT recommend CIR. Hope to have bed either today or tomorrow. BP stable. WBC slightly high, will continue to monitor. Pt will follow up with Dr. Pearlean BrownieSethi on 12/22/17.   Marvel PlanJindong Mylisa Brunson, MD PhD Stroke Neurology 12/09/2017 6:19 PM   To contact Stroke Continuity provider, please refer to [email or web address]. After hours, contact General Neurology

## 2017-12-09 NOTE — H&P (Addendum)
Physical Medicine and Rehabilitation Admission H&P    Chief Complaint  Patient presents with  . 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 He had recently graduated to a quad cane with outpatient therapies. Wife would assist with some bathing and simple ADLs. Presented 12/06/2017 with left upper extremity tingling and weakness.Systolic blood pressure [id number].Cranial CT scan showed newly seen intraparenchymal hemorrhage in the right inferior temporal lobe and right parietal lobe with some subarachnoid blood right parietal .Neurology follow-up suspect cerebral amyloid angiopathy with his latest cranial CT scan showed evolving acute right temporal and right parietal lobe intraparenchymal hematomas. No midline shift.rapid response contacted 12/09/2017 question altered mental status and again scan was repeated showing regional mass effect without midline shift. Small volume residual right parietal subarachnoid hemorrhage and again constellation findings compatible with amyloid angiopathy. EEG follow-up with no seizure activity. Patient remains on Depakote for history of seizure disorder as well as the addition of Vimpat. Tolerating a mechanical soft diet.  Patient developed left shoulder pain overnight after going down for a CT scan.  Shoulder x-ray reveals mild acromioclavicular arthritis and some degenerative changes likely associated with rotator cuff tendinopathy.  Physical and occupational therapy evaluations completed 12/07/2017 with recommendations of physical medicine rehabilitation  consult. Patient was admitted for a comprehensive rehabilitation program    Review of Systems  Constitutional: Negative for chills and fever.  HENT: Negative for hearing loss.   Eyes: Negative for blurred vision and double vision.  Respiratory: Positive for shortness of breath.   Cardiovascular: Positive for leg swelling. Negative for chest pain and palpitations.  Gastrointestinal: Positive for constipation. Negative for nausea and vomiting.  Genitourinary: Negative for flank pain and hematuria.  Musculoskeletal: Positive for joint pain and myalgias.  Skin: Negative for rash.  Neurological: Positive for focal weakness, seizures and headaches.  All other systems reviewed and are negative.  Past Medical History:  Diagnosis Date  . Coronary artery disease   . Dyspnea on exertion   . Hemorrhagic stroke ([place])   . Hyperlipidemia   . Hypertension   . Seizures ([place])   . Stroke ([place])   . Tobacco abuse    Past Surgical History:  Procedure Laterality Date  . CORONARY ARTERY BYPASS GRAFT     Family History  Problem Relation Age of Onset  . Heart disease Father   . CVA Mother   . Heart disease Maternal Grandfather    Social History:  reports that he quit smoking about 12 months ago. His smoking use included cigarettes. he has never used smokeless tobacco. He reports that he drinks alcohol. He reports that he does not use drugs. Allergies:  Allergies  Allergen Reactions  . Sulfonamide Derivatives    Medications Prior to Admission  Medication Sig Dispense Refill  . acetaminophen (TYLENOL) 325 MG tablet Take by mouth every 4 (four) hours as needed.    [name] Kitchen albuterol (PROAIR HFA) 108 (90 Base) MCG/ACT inhaler 2 puffs up to every 4 hours if can't catch your breath (Patient taking differently: Inhale 1-2 puffs into the lungs every 4 (four) hours as needed for wheezing or shortness of breath. ) 1 Inhaler  11  . b complex vitamins tablet Take 1 tablet by mouth daily. 100 tablet 3  . baclofen  (LIORESAL) 20 MG tablet Take 1 tablet (20 mg total) by mouth 3 (three) times daily. 90 each 4  . budesonide-formoterol (SYMBICORT) 160-4.5 MCG/ACT inhaler Take 2 puffs first thing in am and then another 2 puffs about 12 hours later. (Patient taking differently: Inhale 2 puffs into the lungs 2 (two) times daily. ) 1 Inhaler 0  . Cholecalciferol (VITAMIN D3) 2000 units capsule Take 1 capsule (2,000 Units total) by mouth daily. 100 capsule 3  . diclofenac sodium (VOLTAREN) 1 % GEL Apply 2 g topically 4 (four) times daily as needed (Pain). 3 Tube 4  . divalproex (DEPAKOTE) 500 MG DR tablet Take 1 tablet (500 mg total) by mouth daily. 30 tablet 2  . irbesartan (AVAPRO) 75 MG tablet Take 1 tablet (75 mg total) by mouth daily. 90 tablet 3  . metoprolol tartrate (LOPRESSOR) 25 MG tablet Take 0.5 tablets (12.5 mg total) by mouth 2 (two) times daily. 90 tablet 1  . nitroGLYCERIN (NITROSTAT) 0.4 MG SL tablet Place 1 tablet (0.4 mg total) under the tongue every 5 (five) minutes as needed for chest pain. 20 tablet 3  . oseltamivir (TAMIFLU) 75 MG capsule Take 1 capsule (75 mg total) by mouth 2 (two) times daily. 10 capsule 0    Drug Regimen Review Drug regimen was reviewed and remains appropriate with no significant issues identified  Home: Home Living Family/patient expects to be discharged to:: Private residence Living Arrangements: Spouse/significant other Available Help at Discharge: Family, Available 24 hours/day Type of Home: House Home Access: Stairs to enter Technical brewer of Steps: 2 Entrance Stairs-Rails: None Home Layout: One level Bathroom Shower/Tub: Multimedia programmer: Programmer, systems: Yes Home Equipment: Civil engineer, contracting - built in, Environmental consultant - 2 wheels Additional Comments: liked to play golf  Lives With: Spouse   Functional History: Prior Function Level of Independence: Needs assistance Gait / Transfers Assistance Needed: Pt was ambulatory with Min  guard assist using RW  ADL's / Homemaking Assistance Needed: Wife reports she assisted him with bathing peri area, but he was able to perform all other ADLs with min guard assist   Functional Status:  Mobility: Bed Mobility Overal bed mobility: Needs Assistance Bed Mobility: Rolling, Sidelying to Sit Rolling: Min assist Sidelying to sit: Mod assist Supine to sit: Mod assist General bed mobility comments: Min assist to help pt roll to his right side and push up to sitting from side lying.  Manual facilitation to initiate task find bed rail and sequence up to sitting EOB.  Transfers Overall transfer level: Needs assistance Equipment used: None Transfers: Sit to/from Stand, Google Transfers Sit to Stand: Mod assist, From elevated surface Stand pivot transfers: Max assist, +2 physical assistance Squat pivot transfers: Mod assist, From elevated surface General transfer comment: Mod assist to stand x 3 twice EOB  and once from recliner chair up to steady (to practice).  Pt needed assist to weight shift forward over his feet and to extend hips and find midline once standing.  Right lateral and posterior lean.  We did a modified squat/stand picot to the right into the drop arm recliner chair.  Scooted first and then squatted over the last 1/2 Ambulation/Gait General Gait Details: unable    ADL: ADL Overall ADL's : Needs assistance/impaired Eating/Feeding: Maximal assistance, Sitting Eating/Feeding Details (indicate cue type and reason): requires hand over hand assist with Lt UE  Grooming: Wash/dry hands, Wash/dry face, Oral care, Brushing hair, Maximal assistance, Sitting Upper Body Bathing: Maximal assistance, Sitting Lower Body Bathing: Maximal assistance, Sit to/from stand Upper Body Dressing : Maximal assistance, Sitting Lower Body Dressing: Total assistance, Sit to/from stand Toilet Transfer: Maximal assistance, +2 for physical assistance, Squat-pivot, BSC Toileting- Clothing  Manipulation and Hygiene: Total assistance, Sit to/from stand Functional mobility during ADLs: Maximal assistance, +2 for physical assistance, Rolling walker General ADL Comments: Pt with significant impairments bil. UEs which impact his ability to use UEs functionally   Cognition: Cognition Overall Cognitive Status: History of cognitive impairments - at baseline Arousal/Alertness: Awake/alert Orientation Level: Oriented X4 Attention: Selective Selective Attention: Impaired Memory: Impaired Memory Impairment: Retrieval deficit, Storage deficit Awareness: Impaired Executive Function: Decision Making, Self Monitoring Decision Making: Impaired Decision Making Impairment: Verbal basic Self Monitoring: Impaired Safety/Judgment: Impaired Cognition Arousal/Alertness: Awake/alert Behavior During Therapy: WFL for tasks assessed/performed Overall Cognitive Status: History of cognitive impairments - at baseline Area of Impairment: Attention, Following commands, Safety/judgement, Awareness, Problem solving Current Attention Level: Selective, Alternating Following Commands: Follows one step commands consistently, Follows multi-step commands with increased time Safety/Judgement: Decreased awareness of deficits, Decreased awareness of safety Awareness: Emergent Problem Solving: Slow processing, Decreased initiation, Difficulty sequencing, Requires verbal cues, Requires tactile cues General Comments: Slow to process and respond motorocally.  Other cognitive deficits are close to his baseline PTA.   Physical Exam: Blood pressure 124/72, pulse 81, temperature 98.7 F (37.1 C), temperature source Oral, resp. rate 18, height 6' (1.829 m), weight 81.4 kg (179 lb 7.3 oz), SpO2 92 %. Physical Exam Vitals reviewed. Constitutional: He is oriented to person, place, and time. Mood is a bit flat but appropriate HENT:  Head: Normocephalic.  Eyes: EOM are normal. No discharge, pupils reactive to  light Neck: Normal range of motion. Neck supple. No thyromegaly present.  Cardiovascular: Normal rate, regular rhythm and normal heart sounds.  Respiratory: Effort normal and breath sounds normal.  GI: Soft. Bowel sounds are normal. He exhibits no distension Skin. Warm and dry Neurological: He is alert and oriented to person, place, and time.  Speech might be mildly dysarthric but fully intelligible. He follows full commands. Fair awareness of deficits  Motor exam essentially to minus 2 out of 5 right deltoid biceps triceps.  Wrists essentially 2+ to 3- out of 5.  Hand is similar.  Right lower extremity grossly 2+ to 3 out of 5 proximal distal with difficulty and motor sequencing.  Left upper extremity grossly 3 out of 5 to 3+ out of 5 proximal to distal.  Left lower extremity grossly 3+ out of 5 proximal to distal.  Again he seems to have motor planning deficits and some inattention to the left side.  Left alien arm appearance.  Decreased light touch and proprioception also noted left arm and leg.  Speech is slightly dysarthric but intelligible.  He is hyperreflexive on the right side.  There are some spontaneous motor discharges noted in the arm and leg similar to sporadic mild clonus. Psych: Patient is generally pleasant albeit a bit flat      Results for orders placed or performed during the hospital encounter of 12/06/17 (from the past 48 hour(s))  Culture, blood (Routine X 2) w Reflex to ID Panel     Status: None (Preliminary result)   Collection Time: 12/07/17  9:18 AM  Result Value Ref Range   Specimen Description BLOOD LEFT HAND    Special Requests      BOTTLES DRAWN AEROBIC  AND ANAEROBIC Blood Culture adequate volume   Culture NO GROWTH 1 DAY    Report Status PENDING   CBC     Status: Abnormal   Collection Time: 12/07/17  9:22 AM  Result Value Ref Range   WBC 10.7 (H) 4.0 - 10.5 K/uL   RBC 4.79 4.22 - 5.81 MIL/uL   Hemoglobin 14.6 13.0 - 17.0 g/dL   HCT 43.4 39.0 - 52.0 %    MCV 90.6 78.0 - 100.0 fL   MCH 30.5 26.0 - 34.0 pg   MCHC 33.6 30.0 - 36.0 g/dL   RDW 14.6 11.5 - 15.5 %   Platelets 265 150 - 400 K/uL  Basic metabolic panel     Status: Abnormal   Collection Time: 12/07/17  9:22 AM  Result Value Ref Range   Sodium 137 135 - 145 mmol/L   Potassium 4.1 3.5 - 5.1 mmol/L   Chloride 104 101 - 111 mmol/L   CO2 21 (L) 22 - 32 mmol/L   Glucose, Bld 118 (H) 65 - 99 mg/dL   BUN 15 6 - 20 mg/dL   Creatinine, Ser 0.78 0.61 - 1.24 mg/dL   Calcium 9.4 8.9 - 10.3 mg/dL   GFR calc non Af Amer >60 >60 mL/min   GFR calc Af Amer >60 >60 mL/min    Comment: (NOTE) The eGFR has been calculated using the CKD EPI equation. This calculation has not been validated in all clinical situations. eGFR's persistently <60 mL/min signify possible Chronic Kidney Disease.    Anion gap 12 5 - 15  Valproic acid level     Status: Abnormal   Collection Time: 12/07/17  9:22 AM  Result Value Ref Range   Valproic Acid Lvl 30 (L) 50.0 - 100.0 ug/mL  Glucose, capillary     Status: Abnormal   Collection Time: 12/07/17 12:18 PM  Result Value Ref Range   Glucose-Capillary 103 (H) 65 - 99 mg/dL  CBC     Status: None   Collection Time: 12/08/17  8:25 AM  Result Value Ref Range   WBC 10.5 4.0 - 10.5 K/uL   RBC 4.63 4.22 - 5.81 MIL/uL   Hemoglobin 14.1 13.0 - 17.0 g/dL   HCT 42.0 39.0 - 52.0 %   MCV 90.7 78.0 - 100.0 fL   MCH 30.5 26.0 - 34.0 pg   MCHC 33.6 30.0 - 36.0 g/dL   RDW 14.9 11.5 - 15.5 %   Platelets 276 150 - 400 K/uL  Basic metabolic panel     Status: Abnormal   Collection Time: 12/08/17  8:25 AM  Result Value Ref Range   Sodium 137 135 - 145 mmol/L   Potassium 4.0 3.5 - 5.1 mmol/L   Chloride 104 101 - 111 mmol/L   CO2 20 (L) 22 - 32 mmol/L   Glucose, Bld 118 (H) 65 - 99 mg/dL   BUN 15 6 - 20 mg/dL   Creatinine, Ser 0.75 0.61 - 1.24 mg/dL   Calcium 9.2 8.9 - 10.3 mg/dL   GFR calc non Af Amer >60 >60 mL/min   GFR calc Af Amer >60 >60 mL/min    Comment: (NOTE) The  eGFR has been calculated using the CKD EPI equation. This calculation has not been validated in all clinical situations. eGFR's persistently <60 mL/min signify possible Chronic Kidney Disease.    Anion gap 13 5 - 15  CBC     Status: Abnormal   Collection Time: 12/09/17  2:39 AM  Result Value Ref Range  WBC 11.2 (H) 4.0 - 10.5 K/uL   RBC 4.32 4.22 - 5.81 MIL/uL   Hemoglobin 13.4 13.0 - 17.0 g/dL   HCT 39.0 39.0 - 52.0 %   MCV 90.3 78.0 - 100.0 fL   MCH 31.0 26.0 - 34.0 pg   MCHC 34.4 30.0 - 36.0 g/dL   RDW 14.4 11.5 - 15.5 %   Platelets 257 150 - 400 K/uL  Basic metabolic panel     Status: Abnormal   Collection Time: 12/09/17  2:39 AM  Result Value Ref Range   Sodium 136 135 - 145 mmol/L   Potassium 3.8 3.5 - 5.1 mmol/L   Chloride 102 101 - 111 mmol/L   CO2 21 (L) 22 - 32 mmol/L   Glucose, Bld 112 (H) 65 - 99 mg/dL   BUN 17 6 - 20 mg/dL   Creatinine, Ser 0.71 0.61 - 1.24 mg/dL   Calcium 8.9 8.9 - 10.3 mg/dL   GFR calc non Af Amer >60 >60 mL/min   GFR calc Af Amer >60 >60 mL/min    Comment: (NOTE) The eGFR has been calculated using the CKD EPI equation. This calculation has not been validated in all clinical situations. eGFR's persistently <60 mL/min signify possible Chronic Kidney Disease.    Anion gap 13 5 - 15   No results found.     Medical Problem List and Plan: 1.  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  DVT Prophylaxis/Anticoagulation: SCDs. Monitor for any signs of DVT 3. Pain Management: Baclofen 20 mg 3 times a day 4. Mood: Provide emotional support 5. Neuropsych: This patient is capable of making decisions on his own behalf. 6. Skin/Wound Care: Routine skin checks 7. Fluids/Electrolytes/Nutrition: Routine I&O's with follow-up  chemistries 8. History of partial seizure versus transient focal neurological episode. EEG 11/10/2017 negative. Initially on Keppra changed to Depakote 500 mg twice a day 12/07/2017 as well as Vimpat 100 mg twice a day 9. Hypertension. No current antihypertensive medication. Monitor with increased mobility 10. COPD with history of tobacco abuse in the past. Continue Dulera   Post Admission Physician Evaluation: 1. Functional deficits secondary  to intracerebral hemorrhage due to cerebral amyloid angiopathy. 2. Patient is admitted to receive collaborative, interdisciplinary care between the physiatrist, rehab nursing staff, and therapy team. 3. Patient's level of medical complexity and substantial therapy needs in context of that medical necessity cannot be provided at a lesser intensity of care such as a SNF. 4. Patient has experienced substantial functional loss from his/her baseline which was documented above under the "Functional History" and "Functional Status" headings.  Judging by the patient's diagnosis, physical exam, and functional history, the patient has potential for functional progress which will result in measurable gains while on inpatient rehab.  These gains will be of substantial and practical use upon discharge  in facilitating mobility and self-care at the household level. 5. Physiatrist will provide 24 hour management of medical needs as well as oversight of the therapy plan/treatment and provide guidance as appropriate regarding the interaction of the two. 6. The Preadmission Screening has been reviewed and patient status is unchanged unless otherwise stated above. 7. 24 hour rehab nursing will assist with bladder management, bowel management, safety, skin/wound care, disease management, medication administration and pain management  and help integrate  therapy concepts, techniques,education, etc. 8. PT will assess and treat for/with: ADL's, functional mobility, safety, upper  extremity strength, adaptive techniques and equipment, neuromuscular reeducation, visual spatial awareness, family education.   Goals are: Minimal assistance. 9. OT will assess and treat for/with: ADL's, functional mobility, safety, upper extremity strength, adaptive techniques and equipment, neuromuscular reeducation, visual spatial awareness, family education.   Goals are: Minimal assistance. Therapy may proceed with showering this patient. 10. SLP will assess and treat for/with: Cognition, communication, family education.  Goals are: Minimal assistance to supervision. 11. Case Management and Social Worker will assess and treat for psychological issues and discharge planning. 12. Team conference will be held weekly to assess progress toward goals and to determine barriers to discharge. 13. Patient will receive at least 3 hours of therapy per day at least 5 days per week. 14. ELOS: 21-25 days       15. Prognosis:  excellent     Meredith Staggers, MD, [facility] 12/10/2017    Lavon Paganini Knott, PA-C 12/09/2017

## 2017-12-09 NOTE — Progress Notes (Signed)
Physical Therapy Treatment Patient Details Name: Jaime Pierce MRN: [id number] DOB: 07-Mar-1946 Today's Date: 12/09/2017    History of Present Illness 72 yo admitted with new onset LUE tingling and weakness CT of head showed intraparenchymal hematoma right temporal and parietal lobes with small amount of edema.  Old/subacute hemorrhagic insult to the Lt  occipital lobe. and old hematoma  L parietal lobe noted.  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 Eager to work with therapy, focusing on L UE function, trunk mobility, static and dynamic standing balance. Pt able to side step with mod A x2 several steps. CIR recommendation is still appropriate and feel patient will do very well with consistent therapy.    Follow Up Recommendations  CIR;Supervision/Assistance - 24 hour     Equipment Recommendations  Wheelchair cushion (measurements PT);Wheelchair (measurements PT)    Recommendations for Other Services Rehab consult     Precautions / Restrictions Precautions Precautions: Fall Precaution Comments: impaired procioceptive awareness bil.  Restrictions Weight Bearing Restrictions: No    Mobility  Bed Mobility Overal bed mobility: Needs Assistance Bed Mobility: Rolling;Sidelying to Sit Rolling: Mod assist Sidelying to sit: Mod assist Supine to sit: Mod assist     General bed mobility comments: pt required assist for motor planning movement.  Assist to roll to Rt and assist to move LEs off bed and to lift trunk   Transfers Overall transfer level: Needs assistance Equipment used: 2 person hand held assist Transfers: Sit to/from Stand Sit to Stand: Mod assist;+2 physical  assistance Stand pivot transfers: Max assist;+2 physical assistance       General transfer comment: Mod A x2 for sit to stand x3 trials. Pt fatigued from earlier bathing but able to side step EOB. Assist provided for balance, weight shifting and to shuffle feet. posterior lean at times corrected with Mod A.  Ambulation/Gait             General Gait Details: unable   Stairs            Wheelchair Mobility    Modified Rankin (Stroke Patients Only)       Balance Overall balance assessment: Needs assistance Sitting-balance support: Feet supported;No upper extremity supported Sitting balance-Leahy Scale: Fair Sitting balance - Comments: pt able to sit EOB with min guard assist statically.  He demonstrates signficant difficulty moving off of his base of support or isolating trunk movements.  worked on Estate agenttrunk rotation Lt and Rt with min facilitation, as well as reaching activities with Lt UE to promote small excursions of weight shifting    Standing balance support: Bilateral upper extremity supported Standing balance-Leahy Scale: Poor Standing balance comment: Pt moved into standing x 3 with mod A +2.  He requires facilitation to extend hips and knees.  He loses balance posteriorly requiring cueing/assist to correct                             Cognition Arousal/Alertness: Awake/alert Behavior During Therapy: WFL for tasks assessed/performed;Flat affect Overall Cognitive Status: Impaired/Different from baseline Area of Impairment: Attention;Following commands;Safety/judgement;Awareness;Problem solving  Current Attention Level: Sustained;Selective   Following Commands: Follows one step commands consistently;Follows multi-step commands with increased time Safety/Judgement: Decreased awareness of deficits;Decreased awareness of safety Awareness: Intellectual Problem Solving: Slow processing;Decreased initiation;Difficulty  sequencing;Requires verbal cues;Requires tactile cues General Comments: Pt also with significant motor planning deficits which make it difficult to accurately asses cognition       Exercises      General Comments General comments (skin integrity, edema, etc.): wife and daughter present       Pertinent Vitals/Pain Pain Assessment: No/denies pain    Home Living                      Prior Function            PT Goals (current goals can now be found in the care plan section) Acute Rehab PT Goals Patient Stated Goal: to regain independence  PT Goal Formulation: With patient/family Time For Goal Achievement: 12/21/17 Potential to Achieve Goals: Fair Progress towards PT goals: Progressing toward goals    Frequency    Min 4X/week      PT Plan Current plan remains appropriate    Co-evaluation   Reason for Co-Treatment: Complexity of the patient's impairments (multi-system involvement);For patient/therapist safety;To address functional/ADL transfers;Necessary to address cognition/behavior during functional activity PT goals addressed during session: Balance;Mobility/safety with mobility;Strengthening/ROM;Proper use of DME OT goals addressed during session: ADL's and self-care;Strengthening/ROM      AM-PAC PT "6 Clicks" Daily Activity  Outcome Measure  Difficulty turning over in bed (including adjusting bedclothes, sheets and blankets)?: Unable Difficulty moving from lying on back to sitting on the side of the bed? : Unable Difficulty sitting down on and standing up from a chair with arms (e.g., wheelchair, bedside commode, etc,.)?: Unable Help needed moving to and from a bed to chair (including a wheelchair)?: A Lot Help needed walking in hospital room?: Total Help needed climbing 3-5 steps with a railing? : Total 6 Click Score: 7    End of Session Equipment Utilized During Treatment: Gait belt Activity Tolerance: Patient limited by fatigue;Patient tolerated  treatment well Patient left: in chair;with call bell/phone within reach;with family/visitor present Nurse Communication: Mobility status;Need for lift equipment PT Visit Diagnosis: Unsteadiness on feet (R26.81);Muscle weakness (generalized) (M62.81);Other abnormalities of gait and mobility (R26.89);Other symptoms and signs involving the nervous system (R29.898)     Time: 4098-1191 PT Time Calculation (min) (ACUTE ONLY): 45 min  Charges:  $Therapeutic Activity: 8-22 mins                    G Codes:       Etta Grandchild, PT, DPT Acute Rehab Services Pager: [phone]     Etta Grandchild 12/09/2017, 8:54 PM

## 2017-12-09 NOTE — PMR Pre-admission (Signed)
PMR Admission Coordinator Pre-Admission Assessment  Patient: Jaime Pierce is an 72 y.o., male MRN: [id number] DOB: [birth year] Height: 6' (182.9 cm) Weight: 81.4 kg (179 lb 7.3 oz)              Insurance Information HMO:     PPO: X     PCP:      IPA:      80/20:      OTHER:  PRIMARY: [employer] Medicare       Policy#: [id number]      Subscriber: Self CM Name: Hilda Lias      Phone#: [phone]     Fax#: [phone] Pre-Cert#: [id number]  Given by Thereasa Solo for 7 days, 12/10/17-12/16/17    Employer:  Benefits:  Phone #: [phone]     Name: Also verified online, [email or web address] Eff. Date: 11/23/17     Deduct: $0      Out of Pocket Max: $4200      Life Max: N/A CIR: $250 a day, up to $1500 per admission      SNF: $0 a day, days 1-20; $164 a day, days 21-100 Outpatient: PT/OT/SLP      Co-Pay: $40 Home Health: PT/OT/SLP, 100%      Co-Pay: $0 DME: 80%     Co-Pay: 20% Providers: In-network   SECONDARY: None      Policy#:       Subscriber:  CM Name:       Phone#:      Fax#:  Pre-Cert#:       Employer:  Benefits:  Phone #:      Name:  Eff. 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 He had recently graduated to a quad cane with outpatient therapies. Wife would assist with some bathing and simple ADLs. Presented 12/06/2017 with left upper extremity tingling and weakness.Systolic blood pressure 110-120.Cranial CT scan showed newly seen intraparenchymal hemorrhage in the right inferior temporal lobe and right parietal lobe with some subarachnoid blood right parietal .Neurology follow-up suspect cerebral amyloid angiopathy with his latest cranial CT scan showed evolving acute right temporal and right parietal lobe intraparenchymal hematomas. No midline shift.Patient remains on Depakote for history of seizure disorder. Tolerating a mechanical soft diet. Physical and occupational  therapy evaluations completed 12/07/2017 with recommendations of physical medicine rehabilitation consult. Patient was admitted for a comprehensive rehabilitation program 12/10/17.    NIH Total: 2    Past Medical History  Past Medical History:  Diagnosis Date  . Coronary artery disease   . Dyspnea on exertion   . Hemorrhagic stroke (HCC)   . Hyperlipidemia   . Hypertension   . Seizures (HCC)   . Stroke (HCC)   . Tobacco abuse     Family History  family history includes CVA in his mother; Heart disease in his father and maternal grandfather.  Prior Rehab/Hospitalizations:  Has the patient had major surgery during 100 days prior to admission? No  Current Medications   Current Facility-Administered Medications:  .   stroke: mapping our early stages of recovery book, , Does not apply, Once, Ulice Dash, PA-C .  acetaminophen (TYLENOL) tablet 650 mg, 650 mg, Oral, Q4H PRN, 650 mg at 12/08/17 2119  **OR** acetaminophen (TYLENOL) solution 650 mg, 650 mg, Per Tube, Q4H PRN **OR** acetaminophen (TYLENOL) suppository 650 mg, 650 mg, Rectal, Q4H PRN, Ulice Dash, PA-C .  acetaminophen (TYLENOL) tablet 500 mg, 500 mg, Oral, Q6H PRN, Ulice Dash, PA-C, 500 mg at 12/06/17 1612 .  baclofen (LIORESAL) tablet 20 mg, 20 mg, Oral, TID, Ulice Dash, PA-C, 20 mg at 12/10/17 1054 .  divalproex (DEPAKOTE) DR tablet 500 mg, 500 mg, Oral, Q12H, Marvel Plan, MD, 500 mg at 12/10/17 1054 .  lacosamide (VIMPAT) tablet 100 mg, 100 mg, Oral, BID, Marvel Plan, MD, 100 mg at 12/10/17 1054 .  mometasone-formoterol (DULERA) 200-5 MCG/ACT inhaler 2 puff, 2 puff, Inhalation, BID, Marvel Plan, MD, 2 puff at 12/10/17 0849 .  pantoprazole (PROTONIX) EC tablet 40 mg, 40 mg, Oral, Daily, Marvel Plan, MD, 40 mg at 12/10/17 1054 .  senna-docusate (Senokot-S) tablet 1 tablet, 1 tablet, Oral, BID, Ulice Dash, PA-C, 1 tablet at 12/08/17 2112  Patients Current Diet: Diet Heart Room service appropriate? Yes; Fluid consistency: Thin  Precautions / Restrictions Precautions Precautions: Fall Precaution Comments: impaired procioceptive awareness bil.  Restrictions Weight Bearing Restrictions: No   Has the patient had 2 or more falls or a fall with injury in the past year?Yes  Prior Activity Level Community (5-7x/wk): Prior to admission patient was back to being independent with some basic self-care tasks such as self-feeding and shaving with his non-dom. left hand.  He was using a rolling walker at home and had progressed to quad cane with outpatient PT.  Patient admitted to [facility] after CVA in October 2018 and is motivated to return to CIR in order to regian his independence again.    Home Assistive Devices / Equipment Home Assistive Devices/Equipment: Environmental consultant (specify type), Wheelchair Home Equipment: Shower seat - built in, [place] - 2 wheels  Prior Device Use: Indicate devices/aids used by the patient prior to current  illness, exacerbation or injury? Walker  Prior Functional Level Prior Function Level of Independence: Needs assistance Gait / Transfers Assistance Needed: Pt was ambulatory with Min guard assist using RW  ADL's / Homemaking Assistance Needed: Wife reports she assisted him with bathing peri area, but he was able to perform all other ADLs with min guard assist   Self Care: Did the patient need help bathing, dressing, using the toilet or eating? Needed some help  Indoor Mobility: Did the patient need assistance with walking from room to room (with or without device)? Independent  Stairs: Did the patient need assistance with  internal or external stairs (with or without device)? Needed some help  Functional Cognition: Did the patient need help planning regular tasks such as shopping or remembering to take medications? Needed some help  Current Functional Level Cognition  Arousal/Alertness: Awake/alert Overall Cognitive Status: Impaired/Different from baseline Current Attention Level: Sustained, Selective Orientation Level: Oriented X4 Following Commands: Follows one step commands consistently, Follows multi-step commands with increased time Safety/Judgement: Decreased awareness of deficits, Decreased awareness of safety General Comments: Pt also with significant motor planning deficits which make it difficult to accurately asses cognition  Attention: Selective Selective Attention: Impaired Memory: Impaired Memory Impairment: Retrieval deficit, Storage deficit Awareness: Impaired Executive Function: Decision Making, Self Monitoring Decision Making: Impaired Decision Making Impairment: Verbal basic Self Monitoring: Impaired Safety/Judgment: Impaired    Extremity Assessment (includes Sensation/Coordination)  Upper Extremity Assessment: Defer to OT evaluation RUE Deficits / Details: Significant tremor noted Rt UE with mod spasticity (wife reports this is worse than it was PTA).   He is  able to use Rt UE as a gross assist  with assistance  RUE Sensation: decreased proprioception RUE Coordination: decreased fine motor, decreased gross motor LUE Deficits / Details: Pt with ~110* shoulder flexion; elbow flex/ext WFL actively.  He demonstrates gross grasp and release.  He is unable to opose digits dysmetria noted.  Proprioception appears absent as does stereognosis.  He is unable to grade amount of force used/needed to hold onto objects  LUE Sensation: decreased proprioception LUE Coordination: decreased fine motor, decreased gross motor  Lower Extremity Assessment: Generalized weakness(bil LE 3/5 strength with functional ROM)    ADLs  Overall ADL's : Needs assistance/impaired Eating/Feeding: Maximal assistance, Sitting Eating/Feeding Details (indicate cue type and reason): requires hand over hand assist with Lt UE  Grooming: Wash/dry hands, Wash/dry face, Oral care, Brushing hair, Maximal assistance, Sitting Grooming Details (indicate cue type and reason): hand over hand assist  Upper Body Bathing: Maximal assistance, Sitting Lower Body Bathing: Maximal assistance, Sit to/from stand Upper Body Dressing : Maximal assistance, Sitting Lower Body Dressing: Total assistance, Sit to/from stand Toilet Transfer: Maximal assistance, +2 for physical assistance, Squat-pivot, BSC Toileting- Clothing Manipulation and Hygiene: Total assistance, Sit to/from stand Functional mobility during ADLs: +2 for physical assistance, Rolling walker General ADL Comments: Pt with significant impairments bil. UEs which impact his ability to use UEs functionally     Mobility  Overal bed mobility: Needs Assistance Bed Mobility: Rolling, Sidelying to Sit Rolling: Mod assist Sidelying to sit: Mod assist Supine to sit: Mod assist General bed mobility comments: pt required assist for motor planning movement.  Assist to roll to Rt and assist to move LEs off bed and to lift trunk     Transfers  Overall  transfer level: Needs assistance Equipment used: 2 person hand held assist Transfers: Sit to/from Stand Sit to Stand: Mod assist, +2 physical assistance Stand pivot transfers: Max assist, +2 physical assistance Squat pivot transfers: Mod assist, From elevated surface General transfer comment: Mod A x2 for sit to stand x3 trials. Pt fatigued from earlier bathing but able to side step EOB. Assist provided for balance, weight shifting and to shuffle feet. posterior lean at times corrected with Mod A.    Ambulation / Gait / Stairs / Wheelchair Mobility  Ambulation/Gait General Gait Details: unable    Posture / Balance Dynamic Sitting Balance Sitting balance - Comments: pt able to sit EOB with min guard assist statically.  He demonstrates signficant difficulty moving off of his base of support or  isolating trunk movements.  worked on Estate agenttrunk rotation Lt and Rt with min facilitation, as well as reaching activities with Lt UE to promote small excursions of weight shifting  Balance Overall balance assessment: Needs assistance Sitting-balance support: Feet supported, No upper extremity supported Sitting balance-Leahy Scale: Fair Sitting balance - Comments: pt able to sit EOB with min guard assist statically.  He demonstrates signficant difficulty moving off of his base of support or isolating trunk movements.  worked on Estate agenttrunk rotation Lt and Rt with min facilitation, as well as reaching activities with Lt UE to promote small excursions of weight shifting  Postural control: Posterior lean, Right lateral lean Standing balance support: Bilateral upper extremity supported Standing balance-Leahy Scale: Poor Standing balance comment: Pt moved into standing x 3 with mod A +2.  He requires facilitation to extend hips and knees.  He loses balance posteriorly requiring cueing/assist to correct     Special needs/care consideration BiPAP/CPAP: No CPM: No Continuous Drip IV: No Dialysis: No         Life Vest:  No Oxygen: No PTA, now on 1L Special Bed: No Trach Size: No Wound Vac (area): No       Skin: WDL, dry                              Bowel mgmt: Continent, last BM 12/06/17 Bladder mgmt: Continent  Diabetic mgmt: Hemoglobin A1c 5.9     Previous Home Environment Living Arrangements: Spouse/significant other  Lives With: Spouse Available Help at Discharge: Family, Available 24 hours/day Type of Home: House Home Layout: One level Home Access: Stairs to enter Entrance Stairs-Rails: None Secretary/administratorntrance Stairs-Number of Steps: 2 Bathroom Shower/Tub: Health visitorWalk-in shower Bathroom Toilet: Pharmacist, communitytandard Bathroom Accessibility: Yes How Accessible: Accessible via walker Home Care Services: No Additional Comments: liked to play golf  Discharge Living Setting Plans for Discharge Living Setting: Patient's home, Lives with (comment)(Spouse) Type of Home at Discharge: House Discharge Home Layout: One level Discharge Home Access: Ramped entrance Discharge Bathroom Shower/Tub: Walk-in shower Discharge Bathroom Toilet: Standard Discharge Bathroom Accessibility: Yes How Accessible: Accessible via wheelchair, Accessible via walker(transport wheelchair ) Does the patient have any problems obtaining your medications?: No  Social/Family/Support Systems Patient Roles: Spouse, Parent, Other (Comment)(grandparent ) Contact Information: Spouse Bary LericheDonna Doverspike Anticipated Caregiver: Spouse  Anticipated Caregiver's Contact Information: [phone] Ability/Limitations of Caregiver: None Caregiver Availability: 24/7 Discharge Plan Discussed with Primary Caregiver: Yes Is Caregiver In Agreement with Plan?: Yes Does Caregiver/Family have Issues with Lodging/Transportation while Pt is in Rehab?: No  Goals/Additional Needs Patient/Family Goal for Rehab: PT/OT/SLP: Min A Expected length of stay: 21-24 days  Cultural Considerations: Attended church prior to initial CVA had not regained enough confidence for community gait  and balance yet in crowds Dietary Needs: Heart healthy diet restrictions  Equipment Needs: TBD Special Service Needs: None Pt/Family Agrees to Admission and willing to participate: Yes Program Orientation Provided & Reviewed with Pt/Caregiver Including Roles  & Responsibilities: Yes Additional Information Needs: Patient has equipment from previous CVA Information Needs to be Provided By: Team FYI  Decrease burden of Care through IP rehab admission: No  Possible need for SNF placement upon discharge: No  Patient Condition: This patient's condition remains as documented in the consult dated 12/08/17 at 1:22 pm, in which the Rehabilitation Physician determined and documented that the patient's condition is appropriate for intensive rehabilitative care in an inpatient rehabilitation facility. Will admit to inpatient rehab today.  Preadmission  Screen Completed By:  Fae Pippin, 12/10/2017 11:20 AM ______________________________________________________________________   Discussed status with Dr. Riley Kill on 12/10/17 at 1115 and received telephone approval for admission today.  Admission Coordinator:  Fae Pippin, time 1115/Date 12/10/17

## 2017-12-09 NOTE — Plan of Care (Signed)
Reviewed EEG at bedside as it is running. Preliminary read showed background slowing 5-6 Hz, not sharp or spike. No seizure activity seen. Pt still lethargic but orientated. vimpat running. depakote level drawn. Low grade fever at 100.5. UA ordered.   Marvel PlanJindong Ellysa Parrack, MD PhD Stroke Neurology 12/09/2017 7:42 PM

## 2017-12-09 NOTE — Progress Notes (Signed)
Inpatient Rehabilitation  Continuing to follow for timing of medical readiness and insurance authorization.  Plan to follow up with team when I have a decision.  Call if questions.   Charlane FerrettiMelissa Franck Vinal, M.A., CCC/SLP Admission Coordinator  [facility]  Cell [phone]

## 2017-12-09 NOTE — Significant Event (Signed)
Notified Arror, MD of changes in Pt condition. Changes fluctuate with numbness and gravity of limbs. Pt unable to lift left arm. Was able to do it before. Right side still the same from prior stroke in Oct. Left side weaker. CT was done prior to shift change. Notified Charge Viviann SpareSteven, RRT Verlon AuLeslie, and Arline Aspindy, RN assessed NIH again. Will continue to monitor closely. Sticky note noted for ledt shoulder pain.

## 2017-12-09 NOTE — Progress Notes (Signed)
Occupational Therapy Treatment Patient Details Name: Jaime Pierce MRN: [id number] DOB: November 12, 1946 Today's Date: 12/09/2017    History of present illness 72 yo admitted with new onset LUE tingling and weakness CT of head showed intraparenchymal hematoma right temporal and parietal lobes with small amount of edema.  Old/subacute hemorrhagic insult to the Lt  occipital lobe. and old hematoma  L parietal lobe noted.  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  Nevertheless, he was willing to work with OT/PT.  He did demonstrate more difficulty with motor planning, and had increased spasms today (question if due to fatigue from day's activities).  Worked EOB on reaching with Lt UE to grab for objects.  He demonstrated increased ability to use Lt UE functionally, and required mod facilitation for weight shifts.  He stood x 3 with mod A +2 and side stepped up EOB with mod A +2.  Continue to recommend CIR, as he will benefit from the intensity and consistency of therapies, and needs the expertise of a certified stroke center to allow him to maximize his independence with ADLs, functional transfers, and reduce risks of falls, injury, and readmission.  Will follow acutely.   Follow Up Recommendations  CIR;Supervision/Assistance - 24 hour    Equipment Recommendations  3 in 1 bedside commode;Wheelchair (measurements OT);Wheelchair cushion (measurements OT)    Recommendations for Other Services Rehab consult    Precautions / Restrictions Precautions Precautions: Fall Precaution Comments: impaired procioceptive awareness bil.        Mobility Bed Mobility Overal bed mobility: Needs Assistance Bed Mobility: Rolling;Sidelying to  Sit Rolling: Mod assist Sidelying to sit: Mod assist       General bed mobility comments: pt required assist for motor planning movement.  Assist to roll to Rt and assist to move LEs off bed and to lift trunk   Transfers Overall transfer level: Needs assistance Equipment used: 2 person hand held assist Transfers: Sit to/from Stand Sit to Stand: Mod assist;+2 physical assistance         General transfer comment: Pt side stepped up EOB with mod A +2.  Assist provided for balance, weight shifting and to shuffle feet     Balance Overall balance assessment: Needs assistance Sitting-balance support: Feet supported;No upper extremity supported Sitting balance-Leahy Scale: Fair Sitting balance - Comments: pt able to sit EOB with min guard assist statically.  He demonstrates signficant difficulty moving off of his base of support or isolating trunk movements.  worked on Estate agent and Rt with min facilitation, as well as reaching activities with Lt UE to promote small excursions of weight shifting    Standing balance support: Bilateral upper extremity supported Standing balance-Leahy Scale: Poor Standing balance comment: Pt moved into standing x 3 with mod A +2.  He requires facilitation to extend hips and knees.  He loses balance posteriorly requiring cueing/assist to correct                            ADL either performed or assessed with clinical judgement   ADL Overall ADL's : Needs assistance/impaired Eating/Feeding: Maximal assistance;Sitting Eating/Feeding Details (indicate cue  type and reason): requires hand over hand assist with Lt UE  Grooming: Wash/dry hands;Wash/dry face;Oral care;Brushing hair;Maximal assistance;Sitting Grooming Details (indicate cue type and reason): hand over hand assist  Upper Body Bathing: Maximal assistance;Sitting   Lower Body Bathing: Maximal assistance;Sit to/from stand   Upper Body Dressing : Maximal assistance;Sitting   Lower  Body Dressing: Total assistance;Sit to/from stand   Toilet Transfer: Maximal assistance;+2 for physical assistance;Squat-pivot;BSC   Toileting- Clothing Manipulation and Hygiene: Total assistance;Sit to/from stand       Functional mobility during ADLs: +2 for physical assistance;Rolling walker General ADL Comments: Pt with significant impairments bil. UEs which impact his ability to use UEs functionally      Vision       Perception     Praxis      Cognition Arousal/Alertness: Awake/alert Behavior During Therapy: WFL for tasks assessed/performed;Flat affect Overall Cognitive Status: Impaired/Different from baseline Area of Impairment: Attention;Following commands;Safety/judgement;Awareness;Problem solving                   Current Attention Level: Sustained;Selective   Following Commands: Follows one step commands consistently;Follows multi-step commands with increased time Safety/Judgement: Decreased awareness of deficits;Decreased awareness of safety Awareness: Intellectual Problem Solving: Slow processing;Decreased initiation;Difficulty sequencing;Requires verbal cues;Requires tactile cues General Comments: Pt also with significant motor planning deficits which make it difficult to accurately asses cognition         Exercises     Shoulder Instructions       General Comments wife and daughter present     Pertinent Vitals/ Pain       Pain Assessment: No/denies pain  Home Living                                          Prior Functioning/Environment              Frequency  Min 2X/week        Progress Toward Goals  OT Goals(current goals can now be found in the care plan section)  Progress towards OT goals: Progressing toward goals     Plan Discharge plan remains appropriate    Co-evaluation    PT/OT/SLP Co-Evaluation/Treatment: Yes Reason for Co-Treatment: Complexity of the patient's impairments (multi-system  involvement);For patient/therapist safety;Necessary to address cognition/behavior during functional activity;To address functional/ADL transfers   OT goals addressed during session: ADL's and self-care;Strengthening/ROM      AM-PAC PT "6 Clicks" Daily Activity     Outcome Measure   Help from another person eating meals?: A Lot Help from another person taking care of personal grooming?: A Lot Help from another person toileting, which includes using toliet, bedpan, or urinal?: A Lot Help from another person bathing (including washing, rinsing, drying)?: A Lot Help from another person to put on and taking off regular upper body clothing?: A Lot Help from another person to put on and taking off regular lower body clothing?: Total 6 Click Score: 11    End of Session Equipment Utilized During Treatment: Gait belt  OT Visit Diagnosis: Hemiplegia and hemiparesis Hemiplegia - Right/Left: Right Hemiplegia - dominant/non-dominant: Non-Dominant;Dominant Hemiplegia - caused by: Cerebral infarction;Nontraumatic intracerebral hemorrhage   Activity Tolerance Patient tolerated treatment well   Patient Left in bed;with call bell/phone within reach;with family/visitor present   Nurse Communication Mobility status        Time: [id number] OT Time Calculation (min): 45 min  Charges: OT  General Charges $OT Visit: 1 Visit OT Treatments $Neuromuscular Re-education: 8-22 mins  Jeani Hawking, OTR/L [phone]    Jeani Hawking M 12/09/2017, 7:04 PM

## 2017-12-09 NOTE — H&P (Deleted)
  The note originally documented on this encounter has been moved the the encounter in which it belongs.  

## 2017-12-09 NOTE — Progress Notes (Signed)
Called by RN Marylene LandAngela that pt has left facial twitching for 45 sec. Resolved. However, pt seems drowsy sleepy afterward. Family found pt to have left facial droop.   Came to see pt and he was lethargic and sleepy, but open eyes on voice, orientated to place, time and people. Did have left facial droop, LUE 3+/5 proximal and distal, LLE at least 4+/5. Consider new bleed vs. Todd's paralysis. Will do stat CT, stat EEG and vimpat 200mg  iv. Check depakote level and UA to rule out UTI.   Marvel PlanJindong Orit Sanville, MD PhD Stroke Neurology 12/09/2017 6:34 PM  I spent  35 minutes in total face-to-face time with the patient, more than 50% of which was spent in counseling and coordination of care, reviewing test results, images and medication, and discussing the diagnosis of seizure in the setting of ICH, treatment plan and potential prognosis. This patient's care requiresreview of multiple databases, neurological assessment, discussion with family, other specialists and medical decision making of high complexity.

## 2017-12-09 NOTE — Progress Notes (Signed)
  Speech Language Pathology Treatment: Cognitive-Linquistic  Patient Details Name: Delora FuelRichard A Boshers MRN: [id number] DOB: 21-May-1946 Today's Date: 12/09/2017 Time: [id number] SLP Time Calculation (min) (ACUTE ONLY): 15 min  Assessment / Plan / Recommendation Clinical Impression  Pt was seen for skilled ST targeting cognitive goals.  SLP facilitated the session with a semi-complex deductive reasoning puzzle to challenge working memory.  Pt needed mod assist verbal cues to utilize written aids within task to compensate for frequently losing his place.  Pt was able to identify cognition as worsened from his baseline function with supervision question cues.  Pt was left in bed with bed alarm set and call bell within reach.  Continue per current plan of care.    HPI HPI: 72 yo admitted with new onset LUE tingling and weakness CT of head showed intraparenchymal hematoma right temporal and parietal lobes with small amount of edema. Old/subacute hemorrhagic insult to the Lt occipital lobe and old hematoma L parietal lobe noted. Work up for possible vasculitis underway.  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  Currently participating in OP SLP with focus on memory, executive functioning, and awareness.       SLP Plan  Continue with current plan of care       Recommendations                   Follow up Recommendations: Inpatient Rehab Plan: Continue with current plan of care       GO                PageMelanee Spry, Nathan Stallworth L 12/09/2017, 3:32 PM

## 2017-12-09 NOTE — Progress Notes (Signed)
Patient transported to CT via bed for stat CT brain; tolerated without distress; face flushed red; temp. 100.5 upon return to room.

## 2017-12-10 ENCOUNTER — Encounter (HOSPITAL_COMMUNITY): Payer: Self-pay

## 2017-12-10 ENCOUNTER — Ambulatory Visit: Payer: Medicare HMO | Admitting: Physical Therapy

## 2017-12-10 ENCOUNTER — Inpatient Hospital Stay (HOSPITAL_COMMUNITY)
Admission: RE | Admit: 2017-12-10 | Discharge: 2018-01-01 | DRG: 057 | Disposition: A | Discharge status: Other Acute Inpatient Hospital | Payer: Medicare HMO | Source: Intra-hospital | Attending: Physical Medicine & Rehabilitation | Admitting: Physical Medicine & Rehabilitation

## 2017-12-10 ENCOUNTER — Other Ambulatory Visit: Payer: Self-pay

## 2017-12-10 ENCOUNTER — Inpatient Hospital Stay (HOSPITAL_COMMUNITY): Payer: Medicare HMO

## 2017-12-10 DIAGNOSIS — Z8679 Personal history of other diseases of the circulatory system: Secondary | ICD-10-CM | POA: Diagnosis not present

## 2017-12-10 DIAGNOSIS — I610 Nontraumatic intracerebral hemorrhage in hemisphere, subcortical: Secondary | ICD-10-CM

## 2017-12-10 DIAGNOSIS — G40109 Localization-related (focal) (partial) symptomatic epilepsy and epileptic syndromes with simple partial seizures, not intractable, without status epilepticus: Secondary | ICD-10-CM | POA: Diagnosis not present

## 2017-12-10 DIAGNOSIS — I69254 Hemiplegia and hemiparesis following other nontraumatic intracranial hemorrhage affecting left non-dominant side: Principal | ICD-10-CM

## 2017-12-10 DIAGNOSIS — M1711 Unilateral primary osteoarthritis, right knee: Secondary | ICD-10-CM | POA: Diagnosis not present

## 2017-12-10 DIAGNOSIS — Z7951 Long term (current) use of inhaled steroids: Secondary | ICD-10-CM | POA: Diagnosis not present

## 2017-12-10 DIAGNOSIS — I619 Nontraumatic intracerebral hemorrhage, unspecified: Secondary | ICD-10-CM | POA: Diagnosis present

## 2017-12-10 DIAGNOSIS — I69351 Hemiplegia and hemiparesis following cerebral infarction affecting right dominant side: Secondary | ICD-10-CM | POA: Diagnosis not present

## 2017-12-10 DIAGNOSIS — Z951 Presence of aortocoronary bypass graft: Secondary | ICD-10-CM | POA: Diagnosis not present

## 2017-12-10 DIAGNOSIS — I680 Cerebral amyloid angiopathy: Secondary | ICD-10-CM | POA: Diagnosis not present

## 2017-12-10 DIAGNOSIS — Z87891 Personal history of nicotine dependence: Secondary | ICD-10-CM

## 2017-12-10 DIAGNOSIS — I69151 Hemiplegia and hemiparesis following nontraumatic intracerebral hemorrhage affecting right dominant side: Secondary | ICD-10-CM

## 2017-12-10 DIAGNOSIS — D72829 Elevated white blood cell count, unspecified: Secondary | ICD-10-CM | POA: Diagnosis not present

## 2017-12-10 DIAGNOSIS — Z882 Allergy status to sulfonamides status: Secondary | ICD-10-CM | POA: Diagnosis not present

## 2017-12-10 DIAGNOSIS — M25512 Pain in left shoulder: Secondary | ICD-10-CM | POA: Diagnosis not present

## 2017-12-10 DIAGNOSIS — I2510 Atherosclerotic heart disease of native coronary artery without angina pectoris: Secondary | ICD-10-CM | POA: Diagnosis present

## 2017-12-10 DIAGNOSIS — I611 Nontraumatic intracerebral hemorrhage in hemisphere, cortical: Secondary | ICD-10-CM | POA: Diagnosis not present

## 2017-12-10 DIAGNOSIS — I10 Essential (primary) hypertension: Secondary | ICD-10-CM | POA: Diagnosis present

## 2017-12-10 DIAGNOSIS — G8194 Hemiplegia, unspecified affecting left nondominant side: Secondary | ICD-10-CM | POA: Diagnosis not present

## 2017-12-10 DIAGNOSIS — R569 Unspecified convulsions: Secondary | ICD-10-CM | POA: Diagnosis not present

## 2017-12-10 DIAGNOSIS — J449 Chronic obstructive pulmonary disease, unspecified: Secondary | ICD-10-CM | POA: Diagnosis present

## 2017-12-10 DIAGNOSIS — M7582 Other shoulder lesions, left shoulder: Secondary | ICD-10-CM | POA: Diagnosis not present

## 2017-12-10 DIAGNOSIS — Z79899 Other long term (current) drug therapy: Secondary | ICD-10-CM | POA: Diagnosis not present

## 2017-12-10 LAB — BASIC METABOLIC PANEL
ANION GAP: 12 (ref 5–15)
BUN: 13 mg/dL (ref 6–20)
CALCIUM: 8.8 mg/dL — AB (ref 8.9–10.3)
CO2: 23 mmol/L (ref 22–32)
Chloride: 99 mmol/L — ABNORMAL LOW (ref 101–111)
Creatinine, Ser: 0.67 mg/dL (ref 0.61–1.24)
GFR calc Af Amer: >60 mL/min (ref >60)
Glucose, Bld: 112 mg/dL — ABNORMAL HIGH (ref 65–99)
POTASSIUM: 3.7 mmol/L (ref 3.5–5.1)
Sodium: 134 mmol/L — ABNORMAL LOW (ref 135–145)

## 2017-12-10 LAB — CBC
HEMATOCRIT: 38.6 % — AB (ref 39.0–52.0)
Hemoglobin: 13.3 g/dL (ref 13.0–17.0)
MCH: 31 pg (ref 26.0–34.0)
MCHC: 34.5 g/dL (ref 30.0–36.0)
MCV: 90 fL (ref 78.0–100.0)
Platelets: 256 10*3/uL (ref 150–400)
RBC: 4.29 MIL/uL (ref 4.22–5.81)
RDW: 14.8 % (ref 11.5–15.5)
WBC: 11.1 10*3/uL — AB (ref 4.0–10.5)

## 2017-12-10 LAB — VALPROIC ACID LEVEL: Valproic Acid Lvl: 61 ug/mL (ref 50.0–100.0)

## 2017-12-10 MED ORDER — LACOSAMIDE 50 MG PO TABS
100.0000 mg | ORAL_TABLET | Freq: Two times a day (BID) | ORAL | Status: DC
  Administered 2017-12-10 – 2017-12-31 (×43): 100 mg via ORAL
  Filled 2017-12-10 (×43): qty 2

## 2017-12-10 MED ORDER — MOMETASONE FURO-FORMOTEROL FUM 200-5 MCG/ACT IN AERO
2.0000 | INHALATION_SPRAY | Freq: Two times a day (BID) | RESPIRATORY_TRACT | Status: DC
  Administered 2017-12-10 – 2017-12-31 (×33): 2 via RESPIRATORY_TRACT
  Filled 2017-12-10 (×4): qty 8.8

## 2017-12-10 MED ORDER — ACETAMINOPHEN 325 MG PO TABS
650.0000 mg | ORAL_TABLET | ORAL | Status: DC | PRN
  Administered 2017-12-13 – 2017-12-26 (×5): 650 mg via ORAL
  Filled 2017-12-10 (×7): qty 2

## 2017-12-10 MED ORDER — SENNOSIDES-DOCUSATE SODIUM 8.6-50 MG PO TABS
1.0000 | ORAL_TABLET | Freq: Two times a day (BID) | ORAL | Status: DC
  Administered 2017-12-10 – 2017-12-12 (×4): 1 via ORAL
  Filled 2017-12-10 (×8): qty 1

## 2017-12-10 MED ORDER — ONDANSETRON HCL 4 MG PO TABS: 4.0000 mg | ORAL_TABLET | Freq: Four times a day (QID) | ORAL | Status: DC | PRN

## 2017-12-10 MED ORDER — SORBITOL 70 % SOLN: 30.0000 mL | Freq: Every day | Status: DC | PRN

## 2017-12-10 MED ORDER — BACLOFEN 20 MG PO TABS
20.0000 mg | ORAL_TABLET | Freq: Three times a day (TID) | ORAL | Status: DC
  Administered 2017-12-10 – 2017-12-20 (×29): 20 mg via ORAL
  Filled 2017-12-10 (×29): qty 1

## 2017-12-10 MED ORDER — DIVALPROEX SODIUM 500 MG PO DR TAB
500.0000 mg | DELAYED_RELEASE_TABLET | Freq: Two times a day (BID) | ORAL | Status: DC
  Administered 2017-12-10 – 2017-12-31 (×43): 500 mg via ORAL
  Filled 2017-12-10 (×43): qty 1

## 2017-12-10 MED ORDER — ONDANSETRON HCL 4 MG/2ML IJ SOLN: 4.0000 mg | Freq: Four times a day (QID) | INTRAMUSCULAR | Status: DC | PRN

## 2017-12-10 MED ORDER — ACETAMINOPHEN 650 MG RE SUPP: 650.0000 mg | RECTAL | Status: DC | PRN

## 2017-12-10 MED ORDER — ACETAMINOPHEN 160 MG/5ML PO SOLN: 650.0000 mg | ORAL | Status: DC | PRN

## 2017-12-10 MED ORDER — PANTOPRAZOLE SODIUM 40 MG PO TBEC
40.0000 mg | DELAYED_RELEASE_TABLET | Freq: Every day | ORAL | Status: DC
  Administered 2017-12-11 – 2017-12-31 (×21): 40 mg via ORAL
  Filled 2017-12-10 (×21): qty 1

## 2017-12-10 MED ORDER — ACETAMINOPHEN 500 MG PO TABS
500.0000 mg | ORAL_TABLET | Freq: Four times a day (QID) | ORAL | Status: DC | PRN
  Administered 2017-12-12 – 2017-12-22 (×2): 500 mg via ORAL
  Filled 2017-12-10 (×2): qty 1

## 2017-12-10 NOTE — Plan of Care (Signed)
Monitoring Pt closely for any changes in Neuro.

## 2017-12-10 NOTE — Discharge Summary (Signed)
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 Coronary artery disease   . Dyspnea on exertion   . Hemorrhagic stroke (HCC)   . Hyperlipidemia   . Hypertension   . Seizures (HCC)   . Stroke (HCC)   . Tobacco abuse    Past Surgical History:  Procedure Laterality Date  . CORONARY ARTERY BYPASS GRAFT      Medications to be continued on Rehab .  stroke: mapping our early stages of recovery book   Does not apply Once  . baclofen  20 mg Oral TID  . divalproex  500 mg Oral Q12H  . lacosamide  100 mg Oral BID  . mometasone-formoterol  2 puff Inhalation BID  . pantoprazole  40 mg Oral Daily  . senna-docusate  1 tablet Oral BID    LABORATORY STUDIES CBC    Component Value Date/Time   WBC 11.1 (H) 12/10/2017 0731   RBC 4.29 12/10/2017 0731   HGB 13.3 12/10/2017 0731   HCT 38.6 (L) 12/10/2017 0731   PLT 256 12/10/2017 0731   MCV 90.0 12/10/2017 0731   MCH 31.0 12/10/2017 0731   MCHC 34.5 12/10/2017 0731   RDW 14.8 12/10/2017 0731   LYMPHSABS 2.0 12/06/2017 0942   MONOABS 0.8 12/06/2017 0942   EOSABS 0.0 12/06/2017 0942   BASOSABS 0.1 12/06/2017 0942   CMP    Component Value Date/Time   NA 134 (L) 12/10/2017 0731   K 3.7 12/10/2017 0731   CL 99 (L) 12/10/2017 0731   CO2 23 12/10/2017 0731   GLUCOSE 112 (H) 12/10/2017 0731   GLUCOSE 113 (H)  09/21/2006 0832   BUN 13 12/10/2017 0731   CREATININE 0.67 12/10/2017 0731   CALCIUM 8.8 (L) 12/10/2017 0731   PROT 7.3 12/06/2017 0942   ALBUMIN 3.8 12/06/2017 0942   AST 19 12/06/2017 0942   ALT 17 12/06/2017 0942   ALKPHOS 59 12/06/2017 0942   BILITOT 1.1 12/06/2017 0942   GFRNONAA >60 12/10/2017 0731   GFRAA >60 12/10/2017 0731   COAGS Lab Results  Component Value Date   INR 0.98 12/06/2017   INR 1.02 11/27/2017   INR 1.02 09/01/2017   Lipid Panel    Component Value Date/Time   CHOL 131 09/02/2017 0314   TRIG 73 09/02/2017 0314   TRIG 65 09/21/2006 0832   HDL 35 (L) 09/02/2017 0314   CHOLHDL 3.7 09/02/2017 0314   VLDL 15 09/02/2017 0314   LDLCALC 81 09/02/2017 0314   HgbA1C  Lab Results  Component Value Date   HGBA1C 5.9 (H) 09/02/2017   Urinalysis    Component Value Date/Time   COLORURINE YELLOW 12/09/2017 2010   APPEARANCEUR CLEAR 12/09/2017 2010   LABSPEC 1.019 12/09/2017 2010   PHURINE 5.0 12/09/2017 2010   GLUCOSEU NEGATIVE 12/09/2017 2010   GLUCOSEU NEGATIVE 07/01/2017 0938   HGBUR NEGATIVE 12/09/2017  2010   BILIRUBINUR NEGATIVE 12/09/2017 2010   KETONESUR 5 (A) 12/09/2017 2010   PROTEINUR NEGATIVE 12/09/2017 2010   UROBILINOGEN 0.2 07/01/2017 0938   NITRITE NEGATIVE 12/09/2017 2010   LEUKOCYTESUR NEGATIVE 12/09/2017 2010   Urine Drug Screen     Component Value Date/Time   LABOPIA NONE DETECTED 12/06/2017 1506   COCAINSCRNUR NONE DETECTED 12/06/2017 1506   LABBENZ NONE DETECTED 12/06/2017 1506   AMPHETMU NONE DETECTED 12/06/2017 1506   THCU NONE DETECTED 12/06/2017 1506   LABBARB NONE DETECTED 12/06/2017 1506    Alcohol Level    Component Value Date/Time   ETH <10 12/06/2017 0950     SIGNIFICANT DIAGNOSTIC STUDIES I have personally reviewed the radiological images below and agree with the radiology interpretations.  Ct Head Wo Contrast Result Date: 12/07/2017 IMPRESSION:  1. Evolving acute RIGHT temporal and RIGHT parietal lobe  intraparenchymal hematomas. Regional mass effect without midline shift.  2. Small volume RIGHT subarachnoid hemorrhage.  3. Subacute to old LEFT parietal LEFT occipital lobe hematomas. 4. Constellation of findings suggest amyloid angiopathy.   Ct Head Wo Contrast 12/06/2017 IMPRESSION:  Newly seen intraparenchymal hemorrhages in the right inferior temporal lobe and right parietal lobe, with some subarachnoid blood in the sulci near the right parietal hematoma.  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  No evidence of mass effect or shift.   Ct Head Wo Contrast 11/27/2017 IMPRESSION:  Atrophy with small vessel chronic ischemic changes of deep cerebral white matter. Evolving high LEFT posterior parietal infarct with resolution of the high attenuation hematoma component since previous study. Evolving hemorrhagic infarct at LEFT occipital lobe. No new intracranial abnormalities. 1   Mr Lodema Pilot Contrast 11/23/2017 This MRI of the brain with and without contrast shows the following:  1.    Large (432238 mm) sub-chronic hemorrhage in the left parietal lobe showing expected evolutionary changes when compared to the 09/01/2017 MRI.    There is no abnormal enhancement.  2.    Smaller (11 mm) subacute hemorrhage in the left occipital lobe that was not present on the 09/01/2017 MRI.  3.    Few subcortical microhemorrhages.    Amyloid angiopathy cannot be ruled out but the number of microhemorrhages is much less than expected for that diagnosis.  4.    Moderate generalized cortical atrophy and chronic microvascular ischemic change.  5.   13 mm left carotid and T2 hypointense lesion.  This is unchanged when compared to the previous MRI.  INTERPRETING PHYSICIAN: Kaelen A. Epimenio Foot, MD, PhD, FAAN Certified in  Neuroimaging by [employer]  [employer]   Ct Head Wo Contrast 12/09/2017 IMPRESSION: 1. Evolving acute RIGHT temporal and RIGHT parietal intraparenchymal hematomas. Regional mass effect without midline shift. 2. Small volume residual RIGHT parietal subarachnoid hemorrhage. 3. Subacute to old LEFT parietal and small old LEFT occipital lobe hematomas. 4. Constellation of findings most compatible with amyloid angiopathy.   Dg Shoulder Left Port 12/10/2017 IMPRESSION: No acute finding. Calcific rotator cuff tendinopathy. Mild acromioclavicular osteoarthritis. Atherosclerosis.  EEG 12/10/2017 Impression: This awake and drowsy EEG is abnormal due to moderate diffuse background slowing. Clinical Correlation of the above findings indicates diffuse cerebral dysfunction that is non-specific in etiology and can be seen with hypoxic/ischemic injury, toxic/metabolic encephalopathies, neurodegenerative disorders, or medication effect. There were no electrographic seizures in this study. The absence of epileptiform  discharges does not rule out a clinical diagnosis of epilepsy.  Clinical correlation is advised.   HISTORY OF PRESENT ILLNESS Jaime Pierce an 72 y.o.malewho was admitted to the hospital back in October for a left parietal intracranial hemorrhage. Post hospitalization patient did have to go to inpatient rehab and then was followed with outpatient rehab for improvement of right arm and leg strength/mobility. Family states that he had improved significantly regarding his left sided deficits from the October hemorrhage. However, 12/05/2017 at approximately 3 PM patient was watching TV when he had a sudden onset of left arm heaviness and tingling. For that reason and knowing he has had a previous stroke he was brought to the emergency department 12/06/2017. CT the head showed multiple hemorrhages in the right hemisphere including the temporal region, parietal region, and also in the cortices. Patient had not had any  seizures while in the hospital. He was weak in the left arm mostly distally and with impairment of fine motor activity. Patient was not on an antiplatelet medication or anticoagulant PTA and blood pressure was well controlled approximately 110-120 systolically  Date last known well:Date:12/05/2017 Time last known well:Time:15:00 tPA Given:No:Hemorrhagic strokes NIHSS 6 Modified Rankin:Rankin Score=2   HOSPITAL COURSE Jaime Pierce is a 72 y.o. male with history of CAD s/p CABG, HLD, HTN, former smoker, recent ICH and seizure admitted for left arm weakness.   Acute ICH:  left frontal and left temporal ICH. 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 Yes; Fluid consistency: Thin   No antithrombotic prior to admission, now on No antithrombotic.   Avoid antiplatelet or anticoagulation in the future  Ongoing aggressive stroke risk factor management  Therapy recommendations:  CIR   Disposition:  Hopefully CIR in  AM, insurance pending  Hx of ICH - concerning now for CAA  08/2017 left parietal large ICH, and small right frontal SAH - CTA head and neck unremarkable - EF 50-55%, LDL 81 and A1C 5.9 - send to CIR   MRI 11/21/17 left occipital small subacute ICH  Simple partial seizure vs. Transient focal neurologic episodes  10/23/17 - right facial twitching - partial seizure - started on keppra  11/10/17 - outpt saw Dr. Pearlean BrownieSethi - EEG slowing no seizure - keppra changed to depakote due to intolerance  Change depakote to 500mg  bid 12/07/2017  depakote level 30->63->61  12/09/17 - episode of left facial twitching with post ictal and todd's paralysis - vimpat load and then vimpat 100mg  bid  BP management  BP stable   BP goal <  140  Avoid hypertension  Other Stroke Risk Factors  Advanced age  former smoker  Other Active Problems  Spasticity on baclofen  COPD - dulera   Left shoulder pain - X-ray neg for fracture or dislocation - Calcific rotator cuff tendinopathy  Hospital day # 3    DISCHARGE EXAM Blood pressure 135/66, pulse 79, temperature 98.2 F (36.8 C), temperature source Oral, resp. rate 18, height 6' (1.829 m), weight 179 lb 7.3 oz (81.4 kg), SpO2 95 %.   General - Well nourished, well developed, in no apparent distress. Cardiovascular - Regular rate and rhythm with no murmur.  Mental Status -  Level of arousal and orientation to time, place, and person were  intact. Language including expression, naming, repetition, comprehension was assessed and found intact. Attention span and concentration were normal. Fund of Knowledge was assessed and was intact.  Cranial Nerves II - XII - II - Visual field intact OU. III, IV, VI - Extraocular movements intact. V - Facial sensation intact bilaterally. VII - Facial movement intact bilaterally. VIII - Hearing & vestibular intact bilaterally. X - Palate elevates symmetrically. XI - Chin turning & shoulder shrug intact bilaterally. XII - Tongue protrusion intact.  Motor Strength - The patient's strength was 3/5 RUE with limited ROM at right shoulder, 3/5 LUE proximal with pain at left shoulder, 4+/5 tricep and 4-/5 bicep, 3+/5 left hand grip with dexterity difficulty. BLE 4/5.  Bulk was normal and fasciculations were absent.   Motor Tone - Muscle tone was assessed at the neck and appendages and was normal. Reflexes - The patient's reflexes were symmetrical in all extremities and he had no pathological reflexes. Sensory - Light touch, temperature/pinprick were assessed and were symmetrical.   Coordination - The patient had ataxia with LUE for FTN.  Tremor was absent. Gait and Station - deferred.   Discharge Diet  Diet Heart Room service  appropriate? Yes; Fluid consistency: Thin liquids  DISCHARGE PLAN  Disposition:  Transfer to [facility] for ongoing PT, OT and ST  No antithrombotic for secondary stroke prevention.  Recommend ongoing risk factor control by Primary Care Physician at time of discharge from inpatient rehabilitation.  Follow-up Plotnikov, Georgina Quint, MD in 2 weeks following discharge from rehab.  Follow-up with Dr. Delia Heady, Stroke Clinic 12/22/2017  35 minutes were spent preparing discharge.  Marvel Plan, MD PhD Stroke Neurology 12/10/2017 6:17 PM

## 2017-12-10 NOTE — Progress Notes (Signed)
CSW alerted that patient will be admitting to CIR today. No further CSW needs at this time.  CSW signing off.  Blenda NicelyElizabeth Hasan Douse, Kentucky[place]CS[place] Clinical Social Worker [phone]

## 2017-12-10 NOTE — Progress Notes (Signed)
Inpatient Rehabilitation  I have insurance authorization and an IP Rehab bed available, I await medical clearance prior to hopeful admission today.  Call if questions.  Charlane FerrettiMelissa Arriah Wadle, M.A., CCC/SLP Admission Coordinator  [facility]  Cell [phone]

## 2017-12-10 NOTE — Progress Notes (Signed)
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 Was ambulatory with minimal guard using a rolling walker.He had recently graduated to a quad cane with outpatient therapies. Wife would assist with some bathing and simple ADLs. Presented 12/06/2017 with left upper extremity tingling and weakness.systolic blood pressure 110-120.Cranial CT scan showed newly seen intraparenchymal hemorrhage in the right inferior temporal lobe and right parietal lobe with some subarachnoid blood right parietal .neurology follow-up with conservative care late his cranial CT scan showed evolving acute right temporal and right parietal lobe intraparenchymal hematomas. No midline shift.Patient remains on Depakote for history of seizure disorder. Tolerating a mechanical soft diet. Physical therapy evaluation completed 12/07/2017 with recommendations of physical medicine rehabilitation consult.   Review of Systems  Constitutional: Negative for chills and fever.  HENT: Negative for hearing loss.   Eyes: Negative for blurred vision and double vision.  Respiratory: Positive for shortness of breath. Negative for cough.   Cardiovascular: Positive for leg swelling. Negative for chest pain and palpitations.  Gastrointestinal: Positive for constipation. Negative for nausea and vomiting.  Genitourinary: Negative for flank pain and hematuria.  Musculoskeletal: Positive for joint pain and myalgias.  Skin: Negative for rash.  Neurological: Positive for focal weakness, seizures and  headaches.  All other systems reviewed and are negative.      Past Medical History:  Diagnosis Date  . Coronary artery disease   . Dyspnea on exertion   . Hemorrhagic stroke (HCC)   . Hyperlipidemia   . Hypertension   . Seizures (HCC)   . Stroke (HCC)   . Tobacco abuse         Past Surgical History:  Procedure Laterality Date  . CORONARY ARTERY BYPASS GRAFT          Family History  Problem Relation Age of Onset  . Heart disease Father   . CVA Mother   . Heart disease Maternal Grandfather    Social History:  reports that he quit smoking about a year ago. His smoking use included cigarettes. he has never used smokeless tobacco. He reports that he drinks alcohol. He reports that he does not use drugs. Allergies:      Allergies  Allergen Reactions  . Sulfonamide Derivatives          Medications Prior to Admission  Medication Sig Dispense Refill  . acetaminophen (TYLENOL) 325 MG tablet Take by mouth every 4 (four) hours as needed.    [name] Kitchen. albuterol (PROAIR HFA) 108 (90 Base) MCG/ACT inhaler 2 puffs up to every 4 hours if can't catch your breath (Patient taking differently: Inhale 1-2 puffs into the lungs every 4 (four) hours as needed for wheezing or shortness of breath. ) 1 Inhaler 11  . b complex vitamins tablet Take 1 tablet by mouth daily. 100 tablet 3  . baclofen (LIORESAL) 20 MG tablet Take 1 tablet (20 mg total) by mouth 3 (three) times daily. 90 each 4  . budesonide-formoterol (SYMBICORT) 160-4.5 MCG/ACT inhaler Take 2 puffs first thing in am and then another 2 puffs about 12 hours later. (Patient taking differently: Inhale 2 puffs into the lungs 2 (two) times daily. )  1 Inhaler 0  . Cholecalciferol (VITAMIN D3) 2000 units capsule Take 1 capsule (2,000 Units total) by mouth daily. 100 capsule 3  . diclofenac sodium (VOLTAREN) 1 % GEL Apply 2 g topically 4 (four) times daily as needed (Pain). 3 Tube 4  . divalproex (DEPAKOTE) 500 MG DR tablet Take 1  tablet (500 mg total) by mouth daily. 30 tablet 2  . irbesartan (AVAPRO) 75 MG tablet Take 1 tablet (75 mg total) by mouth daily. 90 tablet 3  . metoprolol tartrate (LOPRESSOR) 25 MG tablet Take 0.5 tablets (12.5 mg total) by mouth 2 (two) times daily. 90 tablet 1  . nitroGLYCERIN (NITROSTAT) 0.4 MG SL tablet Place 1 tablet (0.4 mg total) under the tongue every 5 (five) minutes as needed for chest pain. 20 tablet 3  . oseltamivir (TAMIFLU) 75 MG capsule Take 1 capsule (75 mg total) by mouth 2 (two) times daily. 10 capsule 0    Home: Home Living Family/patient expects to be discharged to:: Private residence Living Arrangements: Spouse/significant other Available Help at Discharge: Family, Available 24 hours/day Type of Home: House Home Access: Stairs to enter Secretary/administrator of Steps: 2 Entrance Stairs-Rails: None Home Layout: One level Bathroom Shower/Tub: Health visitor: Pharmacist, community: Yes Home Equipment: Information systems manager - built in, Environmental consultant - 2 wheels Additional Comments: liked to play golf  Functional History: Prior Function Level of Independence: Needs assistance Gait / Transfers Assistance Needed: Pt was ambulatory with Min guard assist using RW  ADL's / Homemaking Assistance Needed: Wife reports she assisted him with bathing peri area, but he was able to perform all other ADLs with min guard assist  Functional Status:  Mobility: Bed Mobility Overal bed mobility: Needs Assistance Bed Mobility: Supine to Sit Supine to sit: Mod assist General bed mobility comments: Pt requires min A to lift shoulders from bed and mod A to scoot hip forward - he was unable to motor plan movement and required step by step cuing  Transfers Overall transfer level: Needs assistance Transfers: Sit to/from Stand, Stand Pivot Transfers Sit to Stand: Max assist, +2 physical assistance Stand pivot transfers: Max assist, +2 physical assistance General transfer  comment: Pt stood x 2.  First attempt, pt required assist to lift hips from bed, extend hips and knees as well as extend trunk.  He demonstrated significant difficulty maintaining standing with max facilitation given due to poor proprioceptive awareness.  He returned to sitting.  RW used on second attempt.  He required assist to lift hips, and extend hips and knees.  He was able to progress to static standing with mod A. He was able to keep Rt hand on walker after it was placed there, howevere, unable to maintain Lt UE on RW.  He was unable to shift weight and required max assist to weight shift to Lt in order to pivot to Rt.  He was unable to maintain controlled posture or to pivot feet or  hips and required max A +2 to transfer into chair Ambulation/Gait General Gait Details: unable  ADL: ADL Overall ADL's : Needs assistance/impaired Eating/Feeding: Maximal assistance, Sitting Eating/Feeding Details (indicate cue type and reason): requires hand over hand assist with Lt UE  Grooming: Wash/dry hands, Wash/dry face, Oral care, Brushing hair, Maximal assistance, Sitting Upper Body Bathing: Maximal assistance, Sitting Lower Body Bathing: Maximal assistance, Sit to/from stand Upper Body Dressing : Maximal assistance, Sitting Lower Body Dressing: Total assistance, Sit to/from stand Toilet Transfer: Maximal assistance, +2 for physical assistance,  Squat-pivot, BSC Toileting- Clothing Manipulation and Hygiene: Total assistance, Sit to/from stand Functional mobility during ADLs: Maximal assistance, +2 for physical assistance, Rolling walker General ADL Comments: Pt with significant impairments bil. UEs which impact his ability to use UEs functionally   Cognition: Cognition Overall Cognitive Status: Impaired/Different from baseline Orientation Level: Oriented X4 Cognition Arousal/Alertness: Awake/alert Behavior During Therapy: WFL for tasks assessed/performed Overall Cognitive Status:  Impaired/Different from baseline Area of Impairment: Attention, Following commands, Safety/judgement, Awareness, Problem solving Current Attention Level: Selective, Alternating Following Commands: Follows one step commands consistently, Follows multi-step commands with increased time Safety/Judgement: Decreased awareness of deficits, Decreased awareness of safety Awareness: Emergent Problem Solving: Slow processing, Decreased initiation, Difficulty sequencing, Requires verbal cues, Requires tactile cues General Comments: severe sensory loss impacts his ability to initiate, sequence and execute activity - difficult to piece out cognitive deficits accurately   Blood pressure 124/67, pulse 83, temperature 97.7 F (36.5 C), temperature source Oral, resp. rate 17, height 6' (1.829 m), weight 79.8 kg (176 lb), SpO2 92 %. Physical Exam  Vitals reviewed. Constitutional: He is oriented to person, place, and time.  HENT:  Head: Normocephalic.  Eyes: EOM are normal.  Neck: Normal range of motion. Neck supple. No thyromegaly present.  Cardiovascular: Normal rate, regular rhythm and normal heart sounds.  Respiratory: Effort normal and breath sounds normal.  GI: Soft. Bowel sounds are normal. He exhibits no distension.  Neurological: He is alert and oriented to person, place, and time.  Speech might be mildly dysarthric but fully intelligible. He follows full commands. Fair awareness of deficits  Skin: Skin is warm and dry.  2- right deltoid biceps triceps 3- right finger flexors and extensors Decreased fine motor right finger to thumb opposition 4/5 in the right hip flexor knee extensor ankle dorsiflexor Left upper extremity 3/5 in the deltoid, bicep, tricep, finger flexors and extensors, needs some visual cues to perform movements. Left lower extremity 3+ in the hip flexor knee extensor ankle dorsiflexor. 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 Does the need for close, 24 hr/day medical supervision in concert with the patient's rehab needs make it unreasonable for this patient to be served in a less intensive setting? Yes 2. Co-Morbidities requiring supervision/potential complications: Coronary artery disease status post CABG hypertension 3. Due to bladder management, bowel management, safety, skin/wound care, disease management, medication administration, pain management and patient education, does the patient require 24 hr/day rehab nursing? Yes 4. Does the patient require coordinated care of a physician, rehab nurse, PT (1-2 hrs/day, 5 days/week), OT (1-2 hrs/day, 5 days/week) and SLP (.5-1 hrs/day, 5 days/week) to address physical and functional deficits in the context of the above medical diagnosis(es)? Yes Addressing deficits in the following areas: balance, endurance, locomotion, strength, transferring, bowel/bladder control, bathing, dressing, feeding, grooming, toileting and psychosocial support 5. Can the patient actively participate in an intensive therapy program of at least 3 hrs of therapy per day at least 5 days per week? Yes 6. The  potential for patient to make measurable gains while on inpatient rehab is good 7. Anticipated functional outcomes upon discharge from inpatient rehab are min assist  with PT, min assist with OT, min assist with SLP. 8. Estimated rehab length of stay to reach the above functional goals  is: 21-24d 9. Anticipated D/C setting: Home 10. Anticipated post D/C treatments: HH therapy 11. Overall Rehab/Functional Prognosis: fair  RECOMMENDATIONS: This patient's condition is appropriate for continued rehabilitative care in the following setting: CIR Patient has agreed to participate in recommended program. Yes Note that insurance prior authorization may be required for reimbursement for recommended care.  Comment: Discussed with patient wife and daughter that outcome will not be as good as his prior rehab stay, due to bilateral hemiparesis as well as severe sensory deficits on the left side  Erick Colace M.D. [facility] FAAPM&R (Sports Med, Neuromuscular Med) Diplomate Am Board of Electrodiagnostic Med  Lynnae Prude 12/07/2017          Revision History                        Routing History

## 2017-12-10 NOTE — Progress Notes (Signed)
PMR Admission Coordinator Pre-Admission Assessment  Patient: Jaime Pierce is an 72 y.o., male MRN: [id number] DOB: 06/06/1946 Height: 6' (182.9 cm) Weight: 81.4 kg (179 lb 7.3 oz)                                                                                                                                                  Insurance Information HMO:     PPO: X     PCP:      IPA:      80/20:      OTHER:  PRIMARY: [employer] Medicare       Policy#: [id number]      Subscriber: Self CM Name: Hilda Lias      Phone#: [phone]     Fax#: [phone] Pre-Cert#: [id number]  Given by Thereasa Solo for 7 days, 12/10/17-12/16/17    Employer:  Benefits:  Phone #: [phone]     Name: Also verified online, [email or web address] Eff. Date: 11/23/17     Deduct: $0      Out of Pocket Max: $4200      Life Max: N/A CIR: $250 a day, up to $1500 per admission      SNF: $0 a day, days 1-20; $164 a day, days 21-100 Outpatient: PT/OT/SLP      Co-Pay: $40 Home Health: PT/OT/SLP, 100%      Co-Pay: $0 DME: 80%     Co-Pay: 20% Providers: In-network   SECONDARY: None      Policy#:       Subscriber:  CM Name:       Phone#:      Fax#:  Pre-Cert#:       Employer:  Benefits:  Phone #:      Name:  Eff. 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 He had recently graduated to a quad cane with outpatient therapies. Wife would assist with some bathing and simple ADLs. Presented 12/06/2017 with left upper extremity tingling and weakness.Systolic blood pressure 110-120.Cranial CT scan showed newly seen intraparenchymal hemorrhage in the right inferior temporal lobe and right parietal lobe with some subarachnoid blood right parietal .Neurology follow-upsuspect cerebral amyloid angiopathy withhislatestcranial CT scan showed evolving acute right temporal and right parietal lobe intraparenchymal hematomas. No midline shift.Patient remains on Depakote for history of seizure disorder. Tolerating a mechanical soft diet. Physicaland occupationaltherapy evaluationscompleted 12/07/2017 with recommendations of physical medicine rehabilitation consult.Patient was admitted for a comprehensive rehabilitation program 12/10/17.    NIH Total: 2  Past Medical History      Past Medical History:  Diagnosis Date  . Coronary artery disease   . Dyspnea on exertion   . Hemorrhagic stroke (HCC)   . Hyperlipidemia   . Hypertension   . Seizures (HCC)   . Stroke (HCC)   . Tobacco abuse     Family History  family history includes CVA in his mother; Heart disease in his father and maternal grandfather.  Prior Rehab/Hospitalizations:  Has the patient had major surgery during 100 days prior to admission? No  Current Medications   Current Facility-Administered Medications:  .   stroke: mapping  our early stages of recovery book, , Does not apply, Once, Ulice Dash, PA-C .  acetaminophen (TYLENOL) tablet 650 mg, 650 mg, Oral, Q4H PRN, 650 mg at 12/08/17 2119 **OR** acetaminophen (TYLENOL) solution 650 mg, 650 mg, Per Tube, Q4H PRN **OR** acetaminophen (TYLENOL) suppository 650 mg, 650 mg, Rectal, Q4H PRN, Ulice Dash, PA-C .  acetaminophen (TYLENOL) tablet 500 mg, 500 mg, Oral, Q6H PRN, Ulice Dash, PA-C, 500 mg at 12/06/17 1612 .  baclofen (LIORESAL) tablet 20 mg, 20 mg, Oral, TID, Ulice Dash, PA-C, 20 mg at 12/10/17 1054 .  divalproex (DEPAKOTE) DR tablet 500 mg, 500 mg, Oral, Q12H, Marvel Plan, MD, 500 mg at 12/10/17 1054 .  lacosamide (VIMPAT) tablet 100 mg, 100 mg, Oral, BID, Marvel Plan, MD, 100 mg at 12/10/17 1054 .  mometasone-formoterol (DULERA) 200-5 MCG/ACT inhaler 2 puff, 2 puff, Inhalation, BID, Marvel Plan, MD, 2 puff at 12/10/17 0849 .  pantoprazole (PROTONIX) EC tablet 40 mg, 40 mg, Oral, Daily, Marvel Plan, MD, 40 mg at 12/10/17 1054 .  senna-docusate (Senokot-S) tablet 1 tablet, 1 tablet, Oral, BID, Ulice Dash, PA-C, 1 tablet at 12/08/17 2112  Patients Current Diet: Diet Heart Room service appropriate? Yes; Fluid consistency: Thin  Precautions / Restrictions Precautions Precautions: Fall Precaution Comments: impaired procioceptive awareness bil.  Restrictions Weight Bearing Restrictions: No   Has the patient had 2 or more falls or a fall with injury in the past year?Yes  Prior Activity Level Community (5-7x/wk): Prior to admission patient was back to being independent with some basic self-care tasks such as self-feeding and shaving with his non-dom. left hand.  He was using a rolling walker at home and had progressed to quad cane with outpatient PT.  Patient [facility] after CVA in October 2018 and is motivated to return to CIR in order to regian his independence again.    Home Assistive Devices / Equipment Home Assistive  Devices/Equipment: Environmental consultant (specify  type), Wheelchair Home Equipment: Shower seat - built in, Raymond - 2 wheels  Prior Device Use: Indicate devices/aids used by the patient prior to current illness, exacerbation or injury? Walker  Prior Functional Level Prior Function Level of Independence: Needs assistance Gait / Transfers Assistance Needed: Pt was ambulatory with Min guard assist using RW  ADL's / Homemaking Assistance Needed: Wife reports she assisted him with bathing peri area, but he was able to perform all other ADLs with min guard assist   Self Care: Did the patient need help bathing, dressing, using the toilet or eating? Needed some help  Indoor Mobility: Did the patient need assistance with walking from room to room (with or without device)? Independent  Stairs: Did the patient need assistance with internal or external stairs (with or without device)? Needed some help  Functional Cognition: Did the patient need help planning regular tasks such as shopping or remembering to take medications? Needed some help  Current Functional Level Cognition  Arousal/Alertness: Awake/alert Overall Cognitive Status: Impaired/Different from baseline Current Attention Level: Sustained, Selective Orientation Level: Oriented X4 Following Commands: Follows one step commands consistently, Follows multi-step commands with increased time Safety/Judgement: Decreased awareness of deficits, Decreased awareness of safety General Comments: Pt also with significant motor planning deficits which make it difficult to accurately asses cognition  Attention: Selective Selective Attention: Impaired Memory: Impaired Memory Impairment: Retrieval deficit, Storage deficit Awareness: Impaired Executive Function: Decision Making, Self Monitoring Decision Making: Impaired Decision Making Impairment: Verbal basic Self Monitoring: Impaired Safety/Judgment: Impaired    Extremity Assessment (includes  Sensation/Coordination)  Upper Extremity Assessment: Defer to OT evaluation RUE Deficits / Details: Significant tremor noted Rt UE with mod spasticity (wife reports this is worse than it was PTA).   He is able to use Rt UE as a gross assist  with assistance  RUE Sensation: decreased proprioception RUE Coordination: decreased fine motor, decreased gross motor LUE Deficits / Details: Pt with ~110* shoulder flexion; elbow flex/ext WFL actively.  He demonstrates gross grasp and release.  He is unable to opose digits dysmetria noted.  Proprioception appears absent as does stereognosis.  He is unable to grade amount of force used/needed to hold onto objects  LUE Sensation: decreased proprioception LUE Coordination: decreased fine motor, decreased gross motor  Lower Extremity Assessment: Generalized weakness(bil LE 3/5 strength with functional ROM)    ADLs  Overall ADL's : Needs assistance/impaired Eating/Feeding: Maximal assistance, Sitting Eating/Feeding Details (indicate cue type and reason): requires hand over hand assist with Lt UE  Grooming: Wash/dry hands, Wash/dry face, Oral care, Brushing hair, Maximal assistance, Sitting Grooming Details (indicate cue type and reason): hand over hand assist  Upper Body Bathing: Maximal assistance, Sitting Lower Body Bathing: Maximal assistance, Sit to/from stand Upper Body Dressing : Maximal assistance, Sitting Lower Body Dressing: Total assistance, Sit to/from stand Toilet Transfer: Maximal assistance, +2 for physical assistance, Squat-pivot, BSC Toileting- Clothing Manipulation and Hygiene: Total assistance, Sit to/from stand Functional mobility during ADLs: +2 for physical assistance, Rolling walker General ADL Comments: Pt with significant impairments bil. UEs which impact his ability to use UEs functionally     Mobility  Overal bed mobility: Needs Assistance Bed Mobility: Rolling, Sidelying to Sit Rolling: Mod assist Sidelying to sit: Mod  assist Supine to sit: Mod assist General bed mobility comments: pt required assist for motor planning movement.  Assist to roll to Rt and assist to move LEs off bed and to lift trunk     Transfers  Overall transfer level: Needs  assistance Equipment used: 2 person hand held assist Transfers: Sit to/from Stand Sit to Stand: Mod assist, +2 physical assistance Stand pivot transfers: Max assist, +2 physical assistance Squat pivot transfers: Mod assist, From elevated surface General transfer comment: Mod A x2 for sit to stand x3 trials. Pt fatigued from earlier bathing but able to side step EOB. Assist provided for balance, weight shifting and to shuffle feet. posterior lean at times corrected with Mod A.    Ambulation / Gait / Stairs / Wheelchair Mobility  Ambulation/Gait General Gait Details: unable    Posture / Balance Dynamic Sitting Balance Sitting balance - Comments: pt able to sit EOB with min guard assist statically.  He demonstrates signficant difficulty moving off of his base of support or isolating trunk movements.  worked on Estate agent and Rt with min facilitation, as well as reaching activities with Lt UE to promote small excursions of weight shifting  Balance Overall balance assessment: Needs assistance Sitting-balance support: Feet supported, No upper extremity supported Sitting balance-Leahy Scale: Fair Sitting balance - Comments: pt able to sit EOB with min guard assist statically.  He demonstrates signficant difficulty moving off of his base of support or isolating trunk movements.  worked on Estate agent and Rt with min facilitation, as well as reaching activities with Lt UE to promote small excursions of weight shifting  Postural control: Posterior lean, Right lateral lean Standing balance support: Bilateral upper extremity supported Standing balance-Leahy Scale: Poor Standing balance comment: Pt moved into standing x 3 with mod A +2.  He requires  facilitation to extend hips and knees.  He loses balance posteriorly requiring cueing/assist to correct     Special needs/care consideration BiPAP/CPAP: No CPM: No Continuous Drip IV: No Dialysis: No         Life Vest: No Oxygen: No PTA, now on 1L Special Bed: No Trach Size: No Wound Vac (area): No       Skin: WDL, dry                              Bowel mgmt: Continent, last BM 12/06/17 Bladder mgmt: Continent  Diabetic mgmt: Hemoglobin A1c 5.9     Previous Home Environment Living Arrangements: Spouse/significant other  Lives With: Spouse Available Help at Discharge: Family, Available 24 hours/day Type of Home: House Home Layout: One level Home Access: Stairs to enter Entrance Stairs-Rails: None Secretary/administrator of Steps: 2 Bathroom Shower/Tub: Health visitor: Pharmacist, community: Yes How Accessible: Accessible via walker Home Care Services: No Additional Comments: liked to play golf  Discharge Living Setting Plans for Discharge Living Setting: Patient's home, Lives with (comment)(Spouse) Type of Home at Discharge: House Discharge Home Layout: One level Discharge Home Access: Ramped entrance Discharge Bathroom Shower/Tub: Walk-in shower Discharge Bathroom Toilet: Standard Discharge Bathroom Accessibility: Yes How Accessible: Accessible via wheelchair, Accessible via walker(transport wheelchair ) Does the patient have any problems obtaining your medications?: No  Social/Family/Support Systems Patient Roles: Spouse, Parent, Other (Comment)(grandparent ) Contact Information: Spouse Kessler Solly Anticipated Caregiver: Spouse  Anticipated Caregiver's Contact Information: [phone] Ability/Limitations of Caregiver: None Caregiver Availability: 24/7 Discharge Plan Discussed with Primary Caregiver: Yes Is Caregiver In Agreement with Plan?: Yes Does Caregiver/Family have Issues with Lodging/Transportation while Pt is in Rehab?:  No  Goals/Additional Needs Patient/Family Goal for Rehab: PT/OT/SLP: Min A Expected length of stay: 21-24 days  Cultural Considerations: Attended church prior to initial CVA had not regained  enough confidence for community gait and balance yet in crowds Dietary Needs: Heart healthy diet restrictions  Equipment Needs: TBD Special Service Needs: None Pt/Family Agrees to Admission and willing to participate: Yes Program Orientation Provided & Reviewed with Pt/Caregiver Including Roles  & Responsibilities: Yes Additional Information Needs: Patient has equipment from previous CVA Information Needs to be Provided By: Team FYI  Decrease burden of Care through IP rehab admission: No  Possible need for SNF placement upon discharge: No  Patient Condition: This patient's condition remains as documented in the consult dated 12/08/17 at 1:22 pm, in which the Rehabilitation Physician determined and documented that the patient's condition is appropriate for intensive rehabilitative care in an inpatient rehabilitation facility. Will admit to inpatient rehab today.  Preadmission Screen Completed By:  Fae PippinMelissa Loic Hobin, 12/10/2017 11:20 AM ______________________________________________________________________   Discussed status with Dr. Riley KillSwartz on 12/10/17 at 1115 and received telephone approval for admission today.  Admission Coordinator:  Fae PippinMelissa Harmon Bommarito, time 1115/Date 12/10/17             Cosigned by: Ranelle OysterSwartz, Zachary T, MD at 12/10/2017 11:51 AM  Revision History

## 2017-12-10 NOTE — H&P (Signed)
Physical Medicine and Rehabilitation Admission H&P       Chief Complaint  Patient presents with  . 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 He had recently graduated to a quad cane with outpatient therapies. Wife would assist with some bathing and simple ADLs. Presented 12/06/2017 with left upper extremity tingling and weakness.Systolic blood pressure [phone].Cranial CT scan showed newly seen intraparenchymal hemorrhage in the right inferior temporal lobe and right parietal lobe with some subarachnoid blood right parietal .Neurology follow-up suspect cerebral amyloid angiopathy with his latest cranial CT scan showed evolving acute right temporal and right parietal lobe intraparenchymal hematomas. No midline shift.rapid response contacted 12/09/2017 question altered mental status and again scan was repeated showing regional mass effect without midline shift. Small volume residual right parietal subarachnoid hemorrhage and again constellation findings compatible with amyloid angiopathy. EEG follow-up with no seizure activity. Patient remains on Depakote for history of seizure disorder as well as the addition of Vimpat. Tolerating a mechanical soft diet.  Patient developed left shoulder pain overnight after going down for a CT scan.  Shoulder x-ray reveals mild acromioclavicular arthritis and some degenerative changes likely associated with rotator cuff tendinopathy.  Physical and occupational therapy evaluations completed 12/07/2017 with recommendations of physical medicine  rehabilitation consult. Patient was admitted for a comprehensive rehabilitation program    Review of Systems  Constitutional: Negative for chills and fever.  HENT: Negative for hearing loss.   Eyes: Negative for blurred vision and double vision.  Respiratory: Positive for shortness of breath.   Cardiovascular: Positive for leg swelling. Negative for chest pain and palpitations.  Gastrointestinal: Positive for constipation. Negative for nausea and vomiting.  Genitourinary: Negative for flank pain and hematuria.  Musculoskeletal: Positive for joint pain and myalgias.  Skin: Negative for rash.  Neurological: Positive for focal weakness, seizures and headaches.  All other systems reviewed and are negative.      Past Medical History:  Diagnosis Date  . Coronary artery disease   . Dyspnea on exertion   . Hemorrhagic stroke ([place])   . Hyperlipidemia   . Hypertension   . Seizures ([place])   . Stroke ([place])   . Tobacco abuse         Past Surgical History:  Procedure Laterality Date  . CORONARY ARTERY BYPASS GRAFT          Family History  Problem Relation Age of Onset  . Heart disease Father   . CVA Mother   . Heart disease Maternal Grandfather    Social History:  reports that he quit smoking about 12 months ago. His smoking use included cigarettes. he has never used smokeless tobacco. He reports that he drinks alcohol. He reports that he does not use drugs. Allergies:      Allergies  Allergen Reactions  . Sulfonamide Derivatives          Medications Prior to Admission  Medication Sig Dispense Refill  . acetaminophen (TYLENOL) 325 MG tablet Take by mouth every 4 (four) hours as needed.    [name] Kitchen albuterol (PROAIR HFA) 108 (90 Base) MCG/ACT inhaler 2 puffs up to every 4 hours if can't catch your breath (Patient  taking differently: Inhale 1-2 puffs into the lungs every 4 (four) hours as needed for wheezing or shortness of breath. ) 1 Inhaler 11  . b complex  vitamins tablet Take 1 tablet by mouth daily. 100 tablet 3  . baclofen (LIORESAL) 20 MG tablet Take 1 tablet (20 mg total) by mouth 3 (three) times daily. 90 each 4  . budesonide-formoterol (SYMBICORT) 160-4.5 MCG/ACT inhaler Take 2 puffs first thing in am and then another 2 puffs about 12 hours later. (Patient taking differently: Inhale 2 puffs into the lungs 2 (two) times daily. ) 1 Inhaler 0  . Cholecalciferol (VITAMIN D3) 2000 units capsule Take 1 capsule (2,000 Units total) by mouth daily. 100 capsule 3  . diclofenac sodium (VOLTAREN) 1 % GEL Apply 2 g topically 4 (four) times daily as needed (Pain). 3 Tube 4  . divalproex (DEPAKOTE) 500 MG DR tablet Take 1 tablet (500 mg total) by mouth daily. 30 tablet 2  . irbesartan (AVAPRO) 75 MG tablet Take 1 tablet (75 mg total) by mouth daily. 90 tablet 3  . metoprolol tartrate (LOPRESSOR) 25 MG tablet Take 0.5 tablets (12.5 mg total) by mouth 2 (two) times daily. 90 tablet 1  . nitroGLYCERIN (NITROSTAT) 0.4 MG SL tablet Place 1 tablet (0.4 mg total) under the tongue every 5 (five) minutes as needed for chest pain. 20 tablet 3  . oseltamivir (TAMIFLU) 75 MG capsule Take 1 capsule (75 mg total) by mouth 2 (two) times daily. 10 capsule 0    Drug Regimen Review Drug regimen was reviewed and remains appropriate with no significant issues identified  Home: Home Living Family/patient expects to be discharged to:: Private residence Living Arrangements: Spouse/significant other Available Help at Discharge: Family, Available 24 hours/day Type of Home: House Home Access: Stairs to enter Technical brewer of Steps: 2 Entrance Stairs-Rails: None Home Layout: One level Bathroom Shower/Tub: Multimedia programmer: Programmer, systems: Yes Home Equipment: Civil engineer, contracting - built in, Environmental consultant - 2 wheels Additional Comments: liked to play golf  Lives With: Spouse   Functional History: Prior Function Level of Independence: Needs  assistance Gait / Transfers Assistance Needed: Pt was ambulatory with Min guard assist using RW  ADL's / Homemaking Assistance Needed: Wife reports she assisted him with bathing peri area, but he was able to perform all other ADLs with min guard assist   Functional Status:  Mobility: Bed Mobility Overal bed mobility: Needs Assistance Bed Mobility: Rolling, Sidelying to Sit Rolling: Min assist Sidelying to sit: Mod assist Supine to sit: Mod assist General bed mobility comments: Min assist to help pt roll to his right side and push up to sitting from side lying.  Manual facilitation to initiate task find bed rail and sequence up to sitting EOB.  Transfers Overall transfer level: Needs assistance Equipment used: None Transfers: Sit to/from Stand, Google Transfers Sit to Stand: Mod assist, From elevated surface Stand pivot transfers: Max assist, +2 physical assistance Squat pivot transfers: Mod assist, From elevated surface General transfer comment: Mod assist to stand x 3 twice EOB  and once from recliner chair up to steady (to practice).  Pt needed assist to weight shift forward over his feet and to extend hips and find midline once standing.  Right lateral and posterior lean.  We did a modified squat/stand picot to the right into the drop arm recliner chair.  Scooted first and then squatted over the last 1/2 Ambulation/Gait General Gait Details: unable  ADL: ADL Overall ADL's :  Needs assistance/impaired Eating/Feeding: Maximal assistance, Sitting Eating/Feeding Details (indicate cue type and reason): requires hand over hand assist with Lt UE  Grooming: Wash/dry hands, Wash/dry face, Oral care, Brushing hair, Maximal assistance, Sitting Upper Body Bathing: Maximal assistance, Sitting Lower Body Bathing: Maximal assistance, Sit to/from stand Upper Body Dressing : Maximal assistance, Sitting Lower Body Dressing: Total assistance, Sit to/from stand Toilet Transfer: Maximal  assistance, +2 for physical assistance, Squat-pivot, BSC Toileting- Clothing Manipulation and Hygiene: Total assistance, Sit to/from stand Functional mobility during ADLs: Maximal assistance, +2 for physical assistance, Rolling walker General ADL Comments: Pt with significant impairments bil. UEs which impact his ability to use UEs functionally   Cognition: Cognition Overall Cognitive Status: History of cognitive impairments - at baseline Arousal/Alertness: Awake/alert Orientation Level: Oriented X4 Attention: Selective Selective Attention: Impaired Memory: Impaired Memory Impairment: Retrieval deficit, Storage deficit Awareness: Impaired Executive Function: Decision Making, Self Monitoring Decision Making: Impaired Decision Making Impairment: Verbal basic Self Monitoring: Impaired Safety/Judgment: Impaired Cognition Arousal/Alertness: Awake/alert Behavior During Therapy: WFL for tasks assessed/performed Overall Cognitive Status: History of cognitive impairments - at baseline Area of Impairment: Attention, Following commands, Safety/judgement, Awareness, Problem solving Current Attention Level: Selective, Alternating Following Commands: Follows one step commands consistently, Follows multi-step commands with increased time Safety/Judgement: Decreased awareness of deficits, Decreased awareness of safety Awareness: Emergent Problem Solving: Slow processing, Decreased initiation, Difficulty sequencing, Requires verbal cues, Requires tactile cues General Comments: Slow to process and respond motorocally.  Other cognitive deficits are close to his baseline PTA.   Physical Exam: Blood pressure 124/72, pulse 81, temperature 98.7 F (37.1 C), temperature source Oral, resp. rate 18, height 6' (1.829 m), weight 81.4 kg (179 lb 7.3 oz), SpO2 92 %. Physical Exam Vitalsreviewed. Constitutional: He isoriented to person, place, and time. Mood is a bit flat but appropriate HENT:    Head:Normocephalic.  Eyes:EOMare normal. No discharge, pupils reactive to light Neck:Normal range of motion.Neck supple.No thyromegalypresent.  Cardiovascular:Normal rate,regular rhythmand normal heart sounds.  Respiratory:Effort normaland breath sounds normal.  DJ:MEQA.Bowel sounds are normal. He exhibitsno distension Skin. Warm and dry Neurological: He isalertand oriented to person, place, and time. Speech might be mildly dysarthric but fully intelligible. He follows full commands. Fair awareness of deficits Motor exam essentially to minus 2 out of 5 right deltoid biceps triceps.  Wrists essentially 2+ to 3- out of 5.  Hand is similar.  Right lower extremity grossly 2+ to 3 out of 5 proximal distal with difficulty and motor sequencing.  Left upper extremity grossly 3 out of 5 to 3+ out of 5 proximal to distal.  Left lower extremity grossly 3+ out of 5 proximal to distal.  Again he seems to have motor planning deficits and some inattention to the left side.  Left alien arm appearance.  Decreased light touch and proprioception also noted left arm and leg.  Speech is slightly dysarthric but intelligible.  He is hyperreflexive on the right side.  There are some spontaneous motor discharges noted in the arm and leg similar to sporadic mild clonus. Psych: Patient is generally pleasant albeit a bit flat      LabResultsLast48Hours  Results for orders placed or performed during the hospital encounter of 12/06/17 (from the past 48 hour(s))  Culture, blood (Routine X 2) w Reflex to ID Panel     Status: None (Preliminary result)   Collection Time: 12/07/17  9:18 AM  Result Value Ref Range   Specimen Description BLOOD LEFT HAND    Special Requests  BOTTLES DRAWN AEROBIC AND ANAEROBIC Blood Culture adequate volume   Culture NO GROWTH 1 DAY    Report Status PENDING   CBC     Status: Abnormal   Collection Time: 12/07/17  9:22 AM  Result Value Ref Range    WBC 10.7 (H) 4.0 - 10.5 K/uL   RBC 4.79 4.22 - 5.81 MIL/uL   Hemoglobin 14.6 13.0 - 17.0 g/dL   HCT 43.4 39.0 - 52.0 %   MCV 90.6 78.0 - 100.0 fL   MCH 30.5 26.0 - 34.0 pg   MCHC 33.6 30.0 - 36.0 g/dL   RDW 14.6 11.5 - 15.5 %   Platelets 265 150 - 400 K/uL  Basic metabolic panel     Status: Abnormal   Collection Time: 12/07/17  9:22 AM  Result Value Ref Range   Sodium 137 135 - 145 mmol/L   Potassium 4.1 3.5 - 5.1 mmol/L   Chloride 104 101 - 111 mmol/L   CO2 21 (L) 22 - 32 mmol/L   Glucose, Bld 118 (H) 65 - 99 mg/dL   BUN 15 6 - 20 mg/dL   Creatinine, Ser 0.78 0.61 - 1.24 mg/dL   Calcium 9.4 8.9 - 10.3 mg/dL   GFR calc non Af Amer >60 >60 mL/min   GFR calc Af Amer >60 >60 mL/min    Comment: (NOTE) The eGFR has been calculated using the CKD EPI equation. This calculation has not been validated in all clinical situations. eGFR's persistently <60 mL/min signify possible Chronic Kidney Disease.    Anion gap 12 5 - 15  Valproic acid level     Status: Abnormal   Collection Time: 12/07/17  9:22 AM  Result Value Ref Range   Valproic Acid Lvl 30 (L) 50.0 - 100.0 ug/mL  Glucose, capillary     Status: Abnormal   Collection Time: 12/07/17 12:18 PM  Result Value Ref Range   Glucose-Capillary 103 (H) 65 - 99 mg/dL  CBC     Status: None   Collection Time: 12/08/17  8:25 AM  Result Value Ref Range   WBC 10.5 4.0 - 10.5 K/uL   RBC 4.63 4.22 - 5.81 MIL/uL   Hemoglobin 14.1 13.0 - 17.0 g/dL   HCT 42.0 39.0 - 52.0 %   MCV 90.7 78.0 - 100.0 fL   MCH 30.5 26.0 - 34.0 pg   MCHC 33.6 30.0 - 36.0 g/dL   RDW 14.9 11.5 - 15.5 %   Platelets 276 150 - 400 K/uL  Basic metabolic panel     Status: Abnormal   Collection Time: 12/08/17  8:25 AM  Result Value Ref Range   Sodium 137 135 - 145 mmol/L   Potassium 4.0 3.5 - 5.1 mmol/L   Chloride 104 101 - 111 mmol/L   CO2 20 (L) 22 - 32 mmol/L   Glucose, Bld 118 (H) 65 - 99 mg/dL   BUN 15 6 - 20 mg/dL    Creatinine, Ser 0.75 0.61 - 1.24 mg/dL   Calcium 9.2 8.9 - 10.3 mg/dL   GFR calc non Af Amer >60 >60 mL/min   GFR calc Af Amer >60 >60 mL/min    Comment: (NOTE) The eGFR has been calculated using the CKD EPI equation. This calculation has not been validated in all clinical situations. eGFR's persistently <60 mL/min signify possible Chronic Kidney Disease.    Anion gap 13 5 - 15  CBC     Status: Abnormal   Collection Time: 12/09/17  2:39 AM  Result  Value Ref Range   WBC 11.2 (H) 4.0 - 10.5 K/uL   RBC 4.32 4.22 - 5.81 MIL/uL   Hemoglobin 13.4 13.0 - 17.0 g/dL   HCT 39.0 39.0 - 52.0 %   MCV 90.3 78.0 - 100.0 fL   MCH 31.0 26.0 - 34.0 pg   MCHC 34.4 30.0 - 36.0 g/dL   RDW 14.4 11.5 - 15.5 %   Platelets 257 150 - 400 K/uL  Basic metabolic panel     Status: Abnormal   Collection Time: 12/09/17  2:39 AM  Result Value Ref Range   Sodium 136 135 - 145 mmol/L   Potassium 3.8 3.5 - 5.1 mmol/L   Chloride 102 101 - 111 mmol/L   CO2 21 (L) 22 - 32 mmol/L   Glucose, Bld 112 (H) 65 - 99 mg/dL   BUN 17 6 - 20 mg/dL   Creatinine, Ser 0.71 0.61 - 1.24 mg/dL   Calcium 8.9 8.9 - 10.3 mg/dL   GFR calc non Af Amer >60 >60 mL/min   GFR calc Af Amer >60 >60 mL/min    Comment: (NOTE) The eGFR has been calculated using the CKD EPI equation. This calculation has not been validated in all clinical situations. eGFR's persistently <60 mL/min signify possible Chronic Kidney Disease.    Anion gap 13 5 - 15     ImagingResults(Last48hours)  No results found.       Medical Problem List and Plan: 1.  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  DVT Prophylaxis/Anticoagulation: SCDs. Monitor for any signs of DVT 3. Pain  Management: Baclofen 20 mg 3 times a day 4. Mood: Provide emotional support 5. Neuropsych: This patient is capable of making decisions on his own behalf. 6. Skin/Wound Care: Routine skin checks 7. Fluids/Electrolytes/Nutrition: Routine I&O's with follow-up chemistries 8. History of partial seizure versus transient focal neurological episode. EEG 11/10/2017 negative. Initially on Keppra changed to Depakote 500 mg twice a day 12/07/2017 as well as Vimpat 100 mg twice a day 9. Hypertension. No current antihypertensive medication. Monitor with increased mobility 10. COPD with history of tobacco abuse in the past. Continue Dulera   Post Admission Physician Evaluation: 1. Functional deficits secondary  to intracerebral hemorrhage due to cerebral amyloid angiopathy. 2. Patient is admitted to receive collaborative, interdisciplinary care between the physiatrist, rehab nursing staff, and therapy team. 3. Patient's level of medical complexity and substantial therapy needs in context of that medical necessity cannot be provided at a lesser intensity of care such as a SNF. 4. Patient has experienced substantial functional loss from his/her baseline which was documented above under the "Functional History" and "Functional Status" headings.  Judging by the patient's diagnosis, physical exam, and functional history, the patient has potential for functional progress which will result in measurable gains while on inpatient rehab.  These gains will be of substantial and practical use upon discharge  in facilitating mobility and self-care at the household level. 5. Physiatrist will provide 24 hour management of medical needs as well as oversight of the therapy plan/treatment and provide guidance as appropriate regarding the interaction of the two. 6. The Preadmission Screening has been reviewed and patient status is unchanged unless otherwise stated above. 7. 24 hour rehab nursing  will assist with bladder  management, bowel management, safety, skin/wound care, disease management, medication administration and pain management  and help integrate therapy concepts, techniques,education, etc. 8. PT will assess and treat for/with: ADL's, functional mobility, safety, upper extremity strength, adaptive techniques and equipment, neuromuscular reeducation, visual spatial awareness, family education.   Goals are: Minimal assistance. 9. OT will assess and treat for/with: ADL's, functional mobility, safety, upper extremity strength, adaptive techniques and equipment, neuromuscular reeducation, visual spatial awareness, family education.   Goals are: Minimal assistance. Therapy may proceed with showering this patient. 10. SLP will assess and treat for/with: Cognition, communication, family education.  Goals are: Minimal assistance to supervision. 11. Case Management and Social Worker will assess and treat for psychological issues and discharge planning. 12. Team conference will be held weekly to assess progress toward goals and to determine barriers to discharge. 13. Patient will receive at least 3 hours of therapy per day at least 5 days per week. 14. ELOS: 21-25 days       15. Prognosis:  excellent     Meredith Staggers, MD, [facility] 12/10/2017    Lavon Paganini Brule, PA-C 12/09/2017

## 2017-12-10 NOTE — Care Management Important Message (Signed)
Important Message  Patient Details  Name: Jaime Pierce MRN: [id number] Date of Birth: 19-May-1946   Medicare Important Message Given:  Yes    Jaime Pierce 12/10/2017, 2:00 PM

## 2017-12-10 NOTE — Plan of Care (Signed)
NO OBSERVED OR REPORTED SEIZURE ACTIVITY.

## 2017-12-10 NOTE — Progress Notes (Signed)
Inpatient Rehabilitation  Discussed case with Dr. Roda ShuttersXu and have received medical clearance for admission today.  Will proceed with admission.  This patient is known to Dr. Riley KillSwartz and Dr. Pearlean BrownieSethi who can follow on CIR.  Call if questions.   Charlane FerrettiMelissa Chamari Cutbirth, M.A., CCC/SLP Admission Coordinator  [facility]  Cell [phone]

## 2017-12-10 NOTE — Procedures (Signed)
ELECTROENCEPHALOGRAM REPORT  Date of Study: 12/09/2017 (assigned to this provider on 12/10/2017, preliminary report in chart for STAT EEG)  Patient's Name: Jaime FuelRichard A Springborn MRN: [id number] Date of Birth: 1946/07/18  Referring Provider: Dr. Marvel PlanJindong Xu  Clinical History: This is a 72 year old man with intracranial hemorrhage and altered mental status. Stat EEG ordered to assess for status epilepticus.  Medications: Depakote Vimpat  Technical Summary: A multichannel digital EEG recording measured by the international 10-20 system with electrodes applied with paste and impedances below 5000 ohms performed as portable with EKG monitoring in an awake and drowsy patient.  Hyperventilation and photic stimulation were not performed.  The digital EEG was referentially recorded, reformatted, and digitally filtered in a variety of bipolar and referential montages for optimal display.   Description: The patient is awake and drowsy during the recording.  During maximal wakefulness, there is a symmetric, medium voltage 5-6 Hz posterior dominant rhythm that poorly attenuates with eye opening. This is admixed with a moderate amount of diffuse 4-5 Hz theta and 2-3 Hz delta slowing of the background, maximal over the bilateral temporal regions. During drowsiness, there is an increase in theta and delta slowing seen. Deeper stages of sleep were not captured. Hyperventilation and photic stimulation were not performed.  There were no epileptiform discharges or electrographic seizures seen.    EKG lead was unremarkable.  Impression: This awake and drowsy EEG is abnormal due to moderate diffuse background slowing.  Clinical Correlation of the above findings indicates diffuse cerebral dysfunction that is non-specific in etiology and can be seen with hypoxic/ischemic injury, toxic/metabolic encephalopathies, neurodegenerative disorders, or medication effect. There were no electrographic seizures in this study. The  absence of epileptiform discharges does not rule out a clinical diagnosis of epilepsy.  Clinical correlation is advised.   Patrcia DollyKaren Aquino, M.D.

## 2017-12-10 NOTE — Significant Event (Addendum)
Rapid Response Event Note RN called concerned for possible stroke Overview: Time Called: 2300 Event Type: Neurologic  Initial Focused Assessment: On arrival pt lying supine in bed, alert and oriented x3, pt with Right side deficits from previous stroke. Pt came into hospital 12/06/2016 for new stroke affecting Left side. Pt able to lift his Left arm during the beginning of the shift. RN was concerned as he was unable to at lift Left arm at this time. Pt able to lift his arm to a certain point where he then reports Left shoulder pain and is unable to lift it any further. He also endorses numbness and tingling to Left side of face with movement.   Interventions:  Plan of Care (if not transferred): Advised RN to call with any concerns. Bodenheimer NP made aware. Dr. Laurence SlateAroor made aware. No new orders at this time. May need to scan Left shoulder. RN to place a sticky note in chart for MD's to evaluate tomorrow. If pain, numbness and tingling develop with no activity Bodenheimer will order CT of shoulder.   Event Summary: Name of Physician Notified: Dr. Laurence SlateAroor  at (PTA RRT )  Name of Consulting Physician Notified: Bodenheimer NP  at 2320  Outcome: Stayed in room and stabalized     [name], Jaime Pierce

## 2017-12-11 ENCOUNTER — Inpatient Hospital Stay (HOSPITAL_COMMUNITY): Payer: Medicare HMO

## 2017-12-11 ENCOUNTER — Inpatient Hospital Stay (HOSPITAL_COMMUNITY): Payer: Medicare HMO | Admitting: Physical Therapy

## 2017-12-11 DIAGNOSIS — M1711 Unilateral primary osteoarthritis, right knee: Secondary | ICD-10-CM

## 2017-12-11 DIAGNOSIS — M7582 Other shoulder lesions, left shoulder: Secondary | ICD-10-CM

## 2017-12-11 LAB — VALPROIC ACID LEVEL: Valproic Acid Lvl: 65 ug/mL (ref 50.0–100.0)

## 2017-12-11 MED ORDER — DICLOFENAC SODIUM 1 % TD GEL
2.0000 g | Freq: Three times a day (TID) | TRANSDERMAL | Status: DC
Start: 2017-12-11 — End: 2018-01-01
  Administered 2017-12-11 – 2017-12-31 (×60): 2 g via TOPICAL
  Filled 2017-12-11 (×2): qty 100

## 2017-12-11 MED ORDER — DIPHENHYDRAMINE HCL 25 MG PO CAPS: 25.0000 mg | ORAL_CAPSULE | Freq: Four times a day (QID) | ORAL | Status: DC | PRN

## 2017-12-11 MED ORDER — HYDROCORTISONE 1 % EX CREA
TOPICAL_CREAM | Freq: Three times a day (TID) | CUTANEOUS | Status: DC | PRN
  Administered 2017-12-13 – 2017-12-29 (×5): via TOPICAL
  Filled 2017-12-11 (×2): qty 28

## 2017-12-11 MED ORDER — DIPHENHYDRAMINE HCL 25 MG PO CAPS
25.0000 mg | ORAL_CAPSULE | Freq: Four times a day (QID) | ORAL | Status: DC | PRN
  Administered 2017-12-11: 25 mg via ORAL
  Filled 2017-12-11 (×2): qty 1

## 2017-12-11 NOTE — Evaluation (Signed)
Physical Therapy Assessment and Plan  Patient Details  Name: Jaime Pierce MRN: [id number] Date of Birth: 13-Jan-1946  PT Diagnosis: Abnormal posture, Abnormality of gait, Coordination disorder, Hemiplegia dominant, Hypertonia and Impaired cognition Rehab Potential: Fair ELOS: 4 weeks    Today's Date: 12/11/2017 PT Individual Time: 1300-1400 PT Individual Time Calculation (min): 60 min    Problem List:  Patient Active Problem List   Diagnosis Date Noted  . Intracerebral hemorrhage 12/10/2017  . Cerebral amyloid angiopathy (CODE) 12/09/2017  . Seizures ([place]) 12/09/2017  . ICH (intracerebral hemorrhage) ([place]) 12/06/2017  . Partial seizure ([place]) 11/08/2017  . Positive colorectal cancer screening using Cologuard test 10/22/2017  . Hemiparesis affecting right side as late effect of cerebrovascular accident (CVA) ([place])   . Cough   . Nontraumatic hemorrhage of left cerebral hemisphere ([place])   . Pain   . Hyponatremia   . Benign essential HTN   . Hemiparesis of right dominant side as late effect of nontraumatic intracerebral hemorrhage ([place])   . Gait disturbance, post-stroke   . Aphasia as late effect of stroke   . SAH (subarachnoid hemorrhage) ([place]) 09/02/2017  . B12 deficiency 09/02/2017  . Well adult exam 06/16/2016  . Cigarette smoker 02/23/2016  . COPD GOLD II  12/24/2014  . CAD (coronary artery disease) 07/22/2011  . HTN (hypertension) 07/22/2011  . Dyslipidemia 07/22/2011  . Smoker 07/22/2011    Past Medical History:  Past Medical History:  Diagnosis Date  . Coronary artery disease   . Dyspnea on exertion   . Hemorrhagic stroke ([place])   . Hyperlipidemia   . Hypertension   . Seizures ([place])   . Stroke ([place])   . Tobacco abuse    Past Surgical History:  Past Surgical History:  Procedure Laterality Date  . 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 He had recently graduated to a quad cane with outpatient therapies. Wife would assist with some bathing and simple ADLs. Presented 12/06/2017 with left upper extremity tingling and weakness.Systolic blood pressure [phone].Cranial CT scan showed newly seen intraparenchymal hemorrhage in the right inferior temporal lobe and right parietal lobe with some subarachnoid blood right parietal .Neurology follow-upsuspect cerebral amyloid angiopathy withhislatestcranial CT scan showed evolving acute right temporal and right parietal lobe intraparenchymal hematomas. No midline shift.rapid response contacted 12/09/2017 question altered mental status and again scan was repeated showing regional mass effect without midline shift. Small volume residual right parietal subarachnoid hemorrhage and again constellation findings compatible with amyloid angiopathy.EEG follow-up with no seizure activity. Patient remains on Depakote for history of seizure disorderas well as the addition of Vimpat. Tolerating a mechanical soft diet.Patient developed left shoulder pain overnight after going down for a CT scan. Shoulder x-ray reveals mild acromioclavicular arthritis and some degenerative changes likely associated with rotator cuff tendinopathy. Patient transferred to CIR on 12/10/2017 .   Patient currently requires total with mobility secondary to muscle weakness and muscle joint tightness, decreased cardiorespiratoy endurance, abnormal tone, motor apraxia, decreased coordination and decreased motor planning, decreased motor planning and ideational apraxia, decreased attention,  decreased awareness, decreased safety awareness and delayed processing and decreased sitting balance,  decreased standing balance, decreased postural control, hemiplegia and decreased balance strategies.  Prior to hospitalization, patient was min with mobility and lived with Spouse in a House home.  Home access is  Stairs to enter.  Patient will benefit from skilled PT intervention to maximize safe functional mobility, minimize fall risk and decrease caregiver burden for planned discharge home with 24 hour assist.  Anticipate patient will benefit from follow up [facility] at discharge.  PT - End of Session Activity Tolerance: Tolerates < 10 min activity, no significant change in vital signs Endurance Deficit: Yes PT Assessment Rehab Potential (ACUTE/IP ONLY): Fair PT Barriers to Discharge: Inaccessible home environment;Medical stability;Home environment access/layout PT Patient demonstrates impairments in the following area(s): Balance;Behavior;Endurance;Motor;Nutrition;Pain;Perception;Safety;Sensory;Skin Integrity;Other (comment) PT Transfers Functional Problem(s): Bed Mobility;Bed to Chair;Car;Furniture PT Locomotion Functional Problem(s): Ambulation;Wheelchair Mobility;Stairs PT Plan PT Intensity: Minimum of 1-2 x/day ,45 to 90 minutes PT Frequency: 5 out of 7 days PT Duration Estimated Length of Stay: 4 weeks  PT Treatment/Interventions: Ambulation/gait training;Cognitive remediation/compensation;Discharge planning;Functional mobility training;DME/adaptive equipment instruction;Pain management;Psychosocial support;Splinting/orthotics;Therapeutic Activities;UE/LE Strength taining/ROM;Visual/perceptual remediation/compensation;Balance/vestibular training;Community reintegration;Disease management/prevention;Functional electrical stimulation;Neuromuscular re-education;Patient/family education;Skin care/wound management;Stair training;Therapeutic Exercise;UE/LE Coordination activities;Wheelchair  propulsion/positioning PT Transfers Anticipated Outcome(s): Mod Assist with LRAD  PT Locomotion Anticipated Outcome(s): Min assist WC mobility, mod assist ambulation with LRAD PT Recommendation Recommendations for Other Services: Neuropsych consult Follow Up Recommendations: Home health PT Patient destination: Home Equipment Recommended: To be determined;Wheelchair (measurements);Wheelchair cushion (measurements)  Skilled Therapeutic Intervention PT instructed patient in PT Evaluation and initiated treatment intervention; see below for results. PT educated patient in [place], rehab potential, rehab goals, and discharge recommendations. Pt's tone and lethargy limited eval to bed level only with one attempt for sit<>stand in stedy as listed below. . Sit>supine completed with total assist and pt left supine in bed with call bell in reach and all needs met.    PT Evaluation Precautions/Restrictions   General   Vital SignsTherapy Vitals Temp: 97.9 F (36.6 C) Temp Source: Oral Pulse Rate: 93 Resp: 18 BP: (!) 142/70 Patient Position (if appropriate): Lying Oxygen Therapy SpO2: 92 % O2 Device: Not Delivered Pain Pain Assessment Pain Assessment: No/denies pain Pain Score: 0-No pain Home Living/Prior Functioning Home Living Available Help at Discharge: Family;Available 24 hours/day Type of Home: House Home Access: Stairs to enter [employer] of Steps: 2 Entrance Stairs-Rails: None Home Layout: One level Bathroom Shower/Tub: Multimedia programmer: Standard Additional Comments: liked to play golf  Lives With: Spouse Prior Function Level of Independence: Needs assistance with ADLs Bath: Minimal Vocation: Retired Comments: still drives, retired Art gallery manager: Impaired Praxis Praxis: Impaired Praxis Impairment Details: Initiation;Ideomotor;Motor planning  Cognition Overall Cognitive Status: Impaired/Different from  baseline Arousal/Alertness: Lethargic Orientation Level: Oriented to person;Disoriented to time;Disoriented to place;Disoriented to situation Attention: Focused Focused Attention: Impaired Focused Attention Impairment: Verbal basic;Functional basic Memory: Impaired Memory Impairment: Retrieval deficit;Storage deficit Awareness: Impaired Awareness Impairment: Intellectual impairment Problem Solving: Impaired Problem Solving Impairment: Verbal basic;Functional basic Safety/Judgment: Impaired Sensation Sensation Light Touch: Impaired by gross assessment Proprioception: Impaired by gross assessment Coordination Gross Motor Movements are Fluid and Coordinated: No Fine Motor Movements are Fluid and Coordinated: No Finger Nose Finger Test: unable to complete on the L, decreased speed and accuracy on the L  Motor  Motor Motor: Hemiplegia;Abnormal tone;Motor apraxia;Abnormal postural alignment and control;Primitive reflexes present;Tetraplegia Motor - Skilled Clinical Observations: hypertonic/apraxic RLE/UE; generalized weakness/decreased coordination/apraxic LUE/LE  Mobility Bed Mobility Bed Mobility: Rolling Right;Rolling Left;Supine to Sit;Sit to Supine Rolling Right: 3: Mod assist Rolling Left:  2: Max assist Supine to Sit: 1: +1 Total assist Supine to Sit Details: Manual facilitation for weight bearing;Manual facilitation for weight shifting;Manual facilitation for placement Sit to Supine: 1: +1 Total assist Transfers Transfers: Yes Sit to Stand: 1: +2 Total assist(in stedy) Transfer via Lift Equipment: Stedy(total +2 due) Locomotion  Ambulation Ambulation: No Gait Gait: No Stairs / Additional Locomotion Stairs: No Wheelchair Mobility Wheelchair Mobility: No(unable to transfer to WC at eval due to lethargy and hypertonia)  Trunk/Postural Assessment  Cervical Assessment Cervical Assessment: Exceptions to WFL(head forward) Thoracic Assessment Thoracic Assessment: Exceptions  to WFL(rounded shoulders) Lumbar Assessment Lumbar Assessment: Exceptions to WFL(posterior pelvic tilt) Postural Control Postural Control: Deficits on evaluation(delayed/absent)  Balance Balance Balance Assessed: Yes Static Sitting Balance Static Sitting - Level of Assistance: 4: Min assist;3: Mod assist Dynamic Sitting Balance Dynamic Sitting - Level of Assistance: 3: Mod assist;2: Max assist Dynamic Sitting - Balance Activities: Forward lean/weight shifting Extremity Assessment      RLE Assessment RLE Assessment: Exceptions to [facility] RLE AROM (degrees) RLE Overall AROM Comments: in supine, pt able to achieve near full knee extension. no active ROM noted in sitting due to flexion tone.  RLE Tone Modified Ashworth Scale for Grading Hypertonia RLE: Affected part(s) rigid in flexion or extension LLE Assessment LLE Assessment: Exceptions to WFL LLE AROM (degrees) LLE Overall AROM Comments: intermittent AROM in supine, able to extend knee in sitting occasionally.  LLE Tone Modified Ashworth Scale for Grading Hypertonia LLE: Considerable increase in muschle tone, passive movement difficult   See Function Navigator for Current Functional Status.   Refer to Care Plan for Long Term Goals  Recommendations for other services: Neuropsych  Discharge Criteria: Patient will be discharged from PT if patient refuses treatment 3 consecutive times without medical reason, if treatment goals not met, if there is a change in medical status, if patient makes no progress towards goals or if patient is discharged from hospital.  The above assessment, treatment plan, treatment alternatives and goals were discussed and mutually agreed upon: by patient and by family  Lorie Phenix 12/11/2017, 8:09 AM

## 2017-12-11 NOTE — Evaluation (Signed)
Speech Language Pathology Assessment and Plan  Patient Details  Name: Jaime Pierce MRN: [id number] Date of Birth: December 19, 1945  SLP Diagnosis: Dysarthria;Cognitive Impairments  Rehab Potential: Good ELOS: 4 weeks    Today's Date: 12/11/2017 SLP Individual Time: 1530-1600 SLP Individual Time Calculation (min): 30 min   Problem List:  Patient Active Problem List   Diagnosis Date Noted  . Intracerebral hemorrhage 12/10/2017  . Cerebral amyloid angiopathy (CODE) 12/09/2017  . Seizures ([place]) 12/09/2017  . ICH (intracerebral hemorrhage) ([place]) 12/06/2017  . Partial seizure ([place]) 11/08/2017  . Positive colorectal cancer screening using Cologuard test 10/22/2017  . Hemiparesis affecting right side as late effect of cerebrovascular accident (CVA) ([place])   . Cough   . Nontraumatic hemorrhage of left cerebral hemisphere ([place])   . Pain   . Hyponatremia   . Benign essential HTN   . Hemiparesis of right dominant side as late effect of nontraumatic intracerebral hemorrhage ([place])   . Gait disturbance, post-stroke   . Aphasia as late effect of stroke   . SAH (subarachnoid hemorrhage) ([place]) 09/02/2017  . B12 deficiency 09/02/2017  . Well adult exam 06/16/2016  . Cigarette smoker 02/23/2016  . COPD GOLD II  12/24/2014  . CAD (coronary artery disease) 07/22/2011  . HTN (hypertension) 07/22/2011  . Dyslipidemia 07/22/2011  . Smoker 07/22/2011   Past Medical History:  Past Medical History:  Diagnosis Date  . Coronary artery disease   . Dyspnea on exertion   . Hemorrhagic stroke ([place])   . Hyperlipidemia   . Hypertension   . Seizures ([place])   . Stroke ([place])   . Tobacco abuse    Past Surgical History:  Past Surgical History:  Procedure Laterality Date  . 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 He had recently graduated to a quad cane with outpatient therapies. Wife would assist with some bathing and simple ADLs. Presented 12/06/2017 with left upper extremity tingling and weakness.Systolic blood pressure [phone].Cranial CT scan showed newly seen intraparenchymal hemorrhage in the right inferior temporal lobe and right parietal lobe with some subarachnoid blood right parietal .Neurology follow-upsuspect cerebral amyloid angiopathy withhislatestcranial CT scan showed evolving acute right temporal and right parietal lobe intraparenchymal hematomas. No midline shift.rapid response contacted 12/09/2017 question altered mental status and again scan was repeated showing regional mass effect without midline shift. Small volume residual right parietal subarachnoid hemorrhage and again constellation findings compatible with amyloid angiopathy.EEG follow-up with no seizure activity. Patient remains on Depakote for history of seizure disorderas well as the addition of Vimpat. Tolerating a mechanical soft diet.Patient developed left shoulder pain overnight after going down for a CT scan. Shoulder x-ray reveals mild acromioclavicular arthritis and some degenerative changes likely associated with rotator cuff tendinopathy. Physicaland occupationaltherapy evaluationscompleted 12/07/2017 with recommendations of physical medicine rehabilitation consult.Patient was admitted for a comprehensive rehabilitation program  Pt seen this date for communication + cognitive-linguistic assessment. Of note, today's evaluation was significantly limited by pt's decreased LOA and participation likely secondary  to medications administered earlier this date. During this evaluation, pt presents with severely impaired attention, recall, and executive functioning. Pt able  to arouse for ~5-30 sec before closing eyes; max stim needed to arouse pt for short period of time. Receptive and expressive language appears grossly intact, though limited verbal expression observed. Mild dysarthria appreciated, which is likely exacerbated by lethargy. At baseline, pt with cognitive deficits; however, was making significant gains in OP ST therapy per family reports. Given pt's current level of functioning, pt would likely benefit from skilled ST intervention targeting aforementioned deficits to maximize pt independence and decrease burden of care. SLP to follow. Of note, may adjust goals as pt becomes more interactive, alert/awake, and participatory.   Skilled Therapeutic Interventions          Cognitive evaluation was completed; however, pt very lethargic, which likely impacted pt's performance. May adjust goals, as appropriate and pt's LOA improves. Briefly pt observed with PO trials of thin liquid via straw with no overt s/sx of aspiration observed. Spouse provided with verbal education re: pt's need for full supervision at this time given lethargy. She verbalized understanding of aspiration risk and precautions (small bites/sips, only feed when fully awake, upright positioning).    SLP Assessment  Patient will need skilled Speech Lanaguage Pathology Services during CIR admission    Recommendations  SLP Diet Recommendations: Age appropriate regular solids;Thin Liquid Administration via: Cup;Straw Medication Administration: Whole meds with liquid Supervision: Full supervision/cueing for compensatory strategies Compensations: Minimize environmental distractions;Slow rate;Small sips/bites;Other (Comment)(fully awake/alert) Postural Changes and/or Swallow Maneuvers: Seated upright 90 degrees Oral Care Recommendations: Oral care  BID Patient destination: Home Follow up Recommendations: Home Health SLP Equipment Recommended: None recommended by SLP    SLP Frequency 3 to 5 out of 7 days   SLP Duration  SLP Intensity  SLP Treatment/Interventions 4 weeks  Minumum of 1-2 x/day, 30 to 90 minutes  Cognitive remediation/compensation;Cueing hierarchy;Functional tasks;Internal/external aids;Patient/family education;Therapeutic Activities;Therapeutic Exercise    Pain Pain Assessment Pain Assessment: No/denies pain  Prior Functioning Cognitive/Linguistic Baseline: Baseline deficits Baseline deficit details: memory, executive functioning, awareness Type of Home: House  Lives With: Spouse Available Help at Discharge: Family;Available 24 hours/day Vocation: Retired  Function:  Eating Eating                 Cognition Comprehension Comprehension assist level: Understands basic 50 - 74% of the time/ requires cueing 25 - 49% of the time  Expression   Expression assist level: Expresses basis less than 25% of the time/requires cueing >75% of the time.  Social Interaction Social Interaction assist level: Interacts appropriately 25 - 49% of time - Needs frequent redirection.  Problem Solving Problem solving assist level: Solves basic 25 - 49% of the time - needs direction more than half the time to initiate, plan or complete simple activities  Memory Memory assist level: Recognizes or recalls less than 25% of the time/requires cueing greater than 75% of the time   Short Term Goals: Week 1: SLP Short Term Goal 1 (Week 1): Pt will maintain alertness for up to 10 minutes with mod stim.  SLP Short Term Goal 2 (Week 1): Pt will answer orientation questions accurately given mod A verbal cues.  SLP Short Term Goal 3 (Week 1): Pt will answer basic wh- questions with mod A verbal cues.  Refer to Care Plan for Long Term Goals  Recommendations for other services: None   Discharge Criteria: Patient will be discharged  from SLP if patient refuses treatment 3 consecutive times without medical reason, if treatment goals not met, if there is a change in medical status, if patient  makes no progress towards goals or if patient is discharged from hospital.  The above assessment, treatment plan, treatment alternatives and goals were discussed and mutually agreed upon: by patient and by family  Hassel Uphoff A Rama Sorci 12/11/2017, 6:00 PM

## 2017-12-11 NOTE — Progress Notes (Signed)
Social Visit I met with the patient and his wife upon her request and went over results of current hospitalization. I agree with present plan of care. Patient was advised to change upcoming office follow-up visit with me from 12/22/16  By atleast 2-3 weeks until patient is discharged from inpatient rehabilitation  Antony Contras, MD  [facility] [address] [place], [place] [address]  Phone [phone] Fax [phone]

## 2017-12-11 NOTE — Evaluation (Addendum)
Occupational Therapy Assessment and Plan  Patient Details  Name: Jaime Pierce MRN: [id number] Date of Birth: 1946-01-03  OT Diagnosis: abnormal posture, acute pain, apraxia, cognitive deficits, hemiplegia affecting dominant side, hemiplegia affecting non-dominant side, lumbago (low back pain), muscle weakness (generalized) and pain in joint Rehab Potential: Rehab Potential (ACUTE ONLY): Good ELOS: 25-28   Today's Date: 12/11/2017 OT Individual Time: 1000-1100 OT Individual Time Calculation (min): 60 min     Problem List:  Patient Active Problem List   Diagnosis Date Noted  . Intracerebral hemorrhage 12/10/2017  . Cerebral amyloid angiopathy (CODE) 12/09/2017  . Seizures ([place]) 12/09/2017  . ICH (intracerebral hemorrhage) ([place]) 12/06/2017  . Partial seizure ([place]) 11/08/2017  . Positive colorectal cancer screening using Cologuard test 10/22/2017  . Hemiparesis affecting right side as late effect of cerebrovascular accident (CVA) ([place])   . Cough   . Nontraumatic hemorrhage of left cerebral hemisphere ([place])   . Pain   . Hyponatremia   . Benign essential HTN   . Hemiparesis of right dominant side as late effect of nontraumatic intracerebral hemorrhage ([place])   . Gait disturbance, post-stroke   . Aphasia as late effect of stroke   . SAH (subarachnoid hemorrhage) ([place]) 09/02/2017  . B12 deficiency 09/02/2017  . Well adult exam 06/16/2016  . Cigarette smoker 02/23/2016  . COPD GOLD II  12/24/2014  . CAD (coronary artery disease) 07/22/2011  . HTN (hypertension) 07/22/2011  . Dyslipidemia 07/22/2011  . Smoker 07/22/2011    Past Medical History:  Past Medical History:  Diagnosis Date  . Coronary artery disease   . Dyspnea on exertion   . Hemorrhagic stroke ([place])   . Hyperlipidemia   . Hypertension   . Seizures ([place])   . Stroke ([place])   . Tobacco abuse    Past Surgical History:  Past Surgical History:  Procedure Laterality Date  . 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 He had recently graduated to a quad cane with outpatient therapies. Wife would assist with some bathing and simple ADLs. Presented 12/06/2017 with left upper extremity tingling and weakness.Systolic blood pressure 169-450.Cranial CT scan showed newly seen intraparenchymal hemorrhage in the right inferior temporal lobe and right parietal lobe with some subarachnoid blood right parietal .Neurology follow-upsuspect cerebral amyloid angiopathy withhislatestcranial CT scan showed evolving acute right temporal and right parietal lobe intraparenchymal hematomas. No midline shift.rapid response contacted 12/09/2017 question altered mental status and again scan was repeated showing regional mass effect without midline shift. Small volume residual right parietal subarachnoid hemorrhage and again constellation findings compatible with amyloid angiopathy.EEG follow-up with no seizure activity. Patient remains on Depakote for history of seizure disorderas well as the addition of Vimpat. Tolerating a mechanical soft diet.Patient developed left shoulder pain overnight after going down for a CT scan. Shoulder x-ray reveals mild acromioclavicular arthritis and some degenerative changes likely associated with rotator cuff tendinopathy. Physicaland occupationaltherapy evaluationscompleted 12/07/2017 with recommendations of physical medicine rehabilitation consult.Patient was admitted for a comprehensive rehabilitation  program   Patient currently requires total with basic self-care skills secondary to muscle weakness, decreased cardiorespiratoy endurance, impaired timing and  sequencing, abnormal tone, unbalanced muscle activation, motor apraxia, decreased coordination and decreased motor planning, decreased visual perceptual skills, decreased midline orientation, decreased problem solving, decreased safety awareness, decreased memory and delayed processing and decreased sitting balance, decreased standing balance, decreased postural control, hemiplegia and decreased balance strategies.  Prior to hospitalization, patient could complete BADL with min.  Patient will benefit from skilled intervention to decrease level of assist with basic self-care skills and increase independence with basic self-care skills prior to discharge home with care partner.  Anticipate patient will require 24 hour supervision, minimal physical assistance and moderate physical assestance and follow up home health.  OT - End of Session Activity Tolerance: Tolerates 10 - 20 min activity with multiple rests Endurance Deficit: Yes OT Assessment Rehab Potential (ACUTE ONLY): Fair OT Barriers to Discharge Comments: unsure if family will be able to provide needed physical assistance at d/c OT Patient demonstrates impairments in the following area(s): Balance;Cognition;Endurance;Motor;Pain;Perception;Safety;Sensory;Vision;Skin Integrity OT Basic ADL's Functional Problem(s): Eating;Grooming;Bathing;Dressing;Toileting OT Transfers Functional Problem(s): Toilet;Tub/Shower OT Additional Impairment(s): Fuctional Use of Upper Extremity OT Plan OT Intensity: Minimum of 1-2 x/day, 45 to 90 minutes OT Frequency: 5 out of 7 days OT Duration/Estimated Length of Stay: 25-28 OT Treatment/Interventions: Balance/vestibular training;Community reintegration;Disease mangement/prevention;Functional electrical stimulation;Neuromuscular re-education;Patient/family  education;Splinting/orthotics;Self Care/advanced ADL retraining;Therapeutic Exercise;UE/LE Coordination activities;Wheelchair propulsion/positioning;Cognitive remediation/compensation;Discharge planning;DME/adaptive equipment instruction;Functional mobility training;Pain management;Psychosocial support;Skin care/wound managment;Therapeutic Activities;UE/LE Strength taining/ROM;Visual/perceptual remediation/compensation OT Self Feeding Anticipated Outcome(s): S OT Basic Self-Care Anticipated Outcome(s): MOD A LB dressing; MIN A UB dressing OT Toileting Anticipated Outcome(s): MIN A bathing; MOD A toileting OT Bathroom Transfers Anticipated Outcome(s): MIN A toilet; MOD A shower OT Recommendation Recommendations for Other Services: Neuropsych consult Patient destination: Home Follow Up Recommendations: Home health OT Equipment Recommended: To be determined   Skilled Therapeutic Intervention 1:1. Wife present for session and familiar with this OT from previous rehab stay. Pt completes bathing and dressing from bed level d/t lethargy and inability to maintain arousal. Pt ultimately total A for BADL tasks d/t decreased coordination, apraxia, body/spatial awareness, increased tone in RUE/LE, and decreased grip strength. With HOH A and VC to grasp wash cloth, OT provides MOD A to wash RUE, chest, abdomen and face. Pt rolls B with MOD A to R and MAX A to L with +2 washing peri area/buttocks. OT notices red swollen rash starting on buttocks and alerts RN to inspect. Pt supine>sitting EOB with total A and sit to stand in stedy with total +2 and +3 to flip flaps down for high perch on stedy. Pt with increased tone in RLE and requires manual facilitaition of foot down on stedy for weight bearing. RN and wife change to hypoallergenic sheets and OT provides min A sitting balance perched on stedy for WB through BLE and VC to look in mirror for visual feedback of midline orientation. With WB RLE able to reduce tone  slightly and stay planted. Pt return to supine as stated above and left pt with MOD elevated, call bell in reach and wife and RN present in room.  OT Evaluation Precautions/Restrictions  Precautions Precautions: Fall Precaution Comments: impaired procioceptive awareness bil.  Restrictions Weight Bearing Restrictions: No General Chart Reviewed: Yes Vital Signs  Pain Pain Assessment Pain Assessment: Faces Faces Pain Scale: Hurts little more("all over") Home Living/Prior Functioning Home Living Family/patient expects to be discharged to:: Private residence Living Arrangements: Spouse/significant other Available Help at Discharge: Family, Available 24 hours/day Type of Home: House Home Access: Stairs to enter [employer] of Steps: 2 Entrance Stairs-Rails:  None Home Layout: One level Bathroom Shower/Tub: Multimedia programmer: Standard Additional Comments: liked to play golf  Lives With: Spouse Prior Function Level of Independence: Needs assistance with ADLs Bath: Minimal Vocation: Retired Comments: still drives, retired Surveyor, mining?: Vision impaired- to be further tested in functional context Perception  Perception: Impaired Praxis Praxis: Impaired Cognition Overall Cognitive Status: Impaired/Different from baseline Arousal/Alertness: Lethargic Orientation Level: Person;Place;Situation Person: Oriented Place: Oriented Situation: Oriented Year: 2019 Month: January Day of Week: Incorrect Immediate Memory Recall: Sock;Blue;Bed Memory Recall: Blue Memory Recall Blue: Without Cue Sensation Sensation Light Touch: Not tested Proprioception: Impaired by gross assessment Coordination Gross Motor Movements are Fluid and Coordinated: No Fine Motor Movements are Fluid and Coordinated: No Motor  Motor Motor: Hemiplegia Motor - Skilled Clinical Observations: hypertonic/apraxic RLE/UE; generalized weakness/decreased  coordination/apraxic Lue/LE Mobility  Bed Mobility Bed Mobility: Rolling Right;Rolling Left Rolling Right: 3: Mod assist Rolling Left: 2: Max assist  Trunk/Postural Assessment  Cervical Assessment Cervical Assessment: Exceptions to WFL(head forward) Thoracic Assessment Thoracic Assessment: Exceptions to WFL(rounded shoulders) Lumbar Assessment Lumbar Assessment: Exceptions to WFL(posterior pelvic tilt) Postural Control Postural Control: Deficits on evaluation(delayed/absent)  Balance Balance Balance Assessed: Yes Static Sitting Balance Static Sitting - Level of Assistance: 4: Min assist Static Sitting - Comment/# of Minutes: sitting seated on stedy flaps required verbal and visual cues for sitting midline Extremity/Trunk Assessment RUE Assessment RUE Assessment: Exceptions to WFL(hypertonic; apraxic, pain in elbow unable to full flex/extend elbow/range shoulder d/t pain) LUE Assessment LUE Assessment: Exceptions to WFL(R hemi from most recent stroke, grossly 2+/5)   See Function Navigator for Current Functional Status.   Refer to Care Plan for Long Term Goals  Recommendations for other services: Neuropsych   Discharge Criteria: Patient will be discharged from OT if patient refuses treatment 3 consecutive times without medical reason, if treatment goals not met, if there is a change in medical status, if patient makes no progress towards goals or if patient is discharged from hospital.  The above assessment, treatment plan, treatment alternatives and goals were discussed and mutually agreed upon: by patient and by family  Tonny Branch 12/11/2017, 12:59 PM

## 2017-12-11 NOTE — Progress Notes (Signed)
[facility] PHYSICAL MEDICINE & REHABILITATION     PROGRESS NOTE    Subjective/Complaints: Did fairly well overnight.  Does feel a bit sleepy overall and has lacked energy during the day.  No fever last night.  Keeping right knee flexed due to pain while lying in bed.  Left shoulder not bothering him overnight  ROS: pt denies nausea, vomiting, diarrhea, cough, shortness of breath or chest pain   Objective: Vital Signs: Blood pressure (!) 142/70, pulse 93, temperature 97.9 F (36.6 C), temperature source Oral, resp. rate 18, height 6' (1.829 m), weight 79.3 kg (174 lb 13.2 oz), SpO2 92 %. Ct Head Wo Contrast  Result Date: 12/09/2017 CLINICAL DATA:  Altered level of consciousness. History of hemorrhagic stroke, hypertension, hyperlipidemia and seizures. EXAM: CT HEAD WITHOUT CONTRAST TECHNIQUE: Contiguous axial images were obtained from the base of the skull through the vertex without intravenous contrast. COMPARISON:  CT HEAD December 07, 2016 FINDINGS: BRAIN: Evolving RIGHT temporal and RIGHT parietal intraparenchymal hematomas with mild regional mass effect. Small volume residual RIGHT parietal subarachnoid hemorrhage. LEFT subacute to old hematoma and small old LEFT occipital lobe hematomas. No midline shift. Moderate ventriculomegaly on the basis of global parenchymal brain volume loss. Patchy to confluent supratentorial white matter hypodensities compatible with chronic small vessel ischemic disease. No acute large vascular territory infarcts. Basal cisterns are patent. VASCULAR: Mild to moderate calcific atherosclerosis of the carotid siphons. SKULL: No skull fracture. No significant scalp soft tissue swelling. RIGHT parietal sebaceous cyst. SINUSES/ORBITS: Lobulated paranasal sinus mucosal thickening. Approximate tooth 16 periapical abscess. Trace LEFT mastoid effusion. Included ocular globes and orbital contents are non-suspicious. OTHER: None. IMPRESSION: 1. Evolving acute RIGHT temporal and  RIGHT parietal intraparenchymal hematomas. Regional mass effect without midline shift. 2. Small volume residual RIGHT parietal subarachnoid hemorrhage. 3. Subacute to old LEFT parietal and small old LEFT occipital lobe hematomas. 4. Constellation of findings most compatible with amyloid angiopathy. Electronically Signed   By: Awilda Metro M.D.   On: 12/09/2017 18:52   Dg Shoulder Left Port  Result Date: 12/10/2017 CLINICAL DATA:  Left shoulder pain beginning last night. No known injury. EXAM: LEFT SHOULDER - 1 VIEW COMPARISON:  None. FINDINGS: There is no acute bony or joint abnormality. The calcification over the greater tuberosity is consistent with calcific rotator cuff tendinopathy. Mild acromioclavicular degenerative change is seen. The glenohumeral joint is unremarkable. Imaged left lung and ribs appear normal. The patient is status post CABG. Aortic atherosclerosis is noted. IMPRESSION: No acute finding. Calcific rotator cuff tendinopathy. Mild acromioclavicular osteoarthritis. Atherosclerosis. Electronically Signed   By: Drusilla Kanner M.D.   On: 12/10/2017 12:04   Recent Labs    12/09/17 0239 12/10/17 0731  WBC 11.2* 11.1*  HGB 13.4 13.3  HCT 39.0 38.6*  PLT 257 256   Recent Labs    12/09/17 0239 12/10/17 0731  NA 136 134*  K 3.8 3.7  CL 102 99*  GLUCOSE 112* 112*  BUN 17 13  CREATININE 0.71 0.67  CALCIUM 8.9 8.8*   CBG (last 3)  No results for input(s): GLUCAP in the last 72 hours.  Wt Readings from Last 3 Encounters:  12/10/17 79.3 kg (174 lb 13.2 oz)  12/07/17 81.4 kg (179 lb 7.3 oz)  12/03/17 79.8 kg (176 lb)    Physical Exam:  Constitutional: He isoriented to person, place, and time.Mood is a bit flat but appropriate HENT:  Head:Normocephalic.  Eyes:EOMI Neck:Normal range of motion.Neck supple.No thyromegalypresent.  Cardiovascular:RRR without murmur. No JVD  Respiratory:CTA Bilaterally without wheezes or rales. Normal effort .   ZO:XWRU.Bowel sounds are normal. He exhibitsno distension Skin. Warm and dry Musc: Left shoulder tender to abduction as well as basic rotation.  He has some pain over the left AC joint.  Right knee has some generalized effusion and tenderness along the joint line. Neurological: He is  oriented to person, place, and time.   Speech mildly dysarthric.  Fair insight and awareness.  He is a bit drowsyMotor exam essentially to minus 2 out of 5 right deltoid biceps triceps. Wrists essentially 2+ to 3- out of 5. Hand is similar. Right lower extremity grossly 2+ to 3 out of 5 proximal distal with difficulty and motor sequencing. Left upper extremity grossly 3 out of 5 to 3+ out of 5 proximal to distal. Left lower extremity grossly 3+ out of 5 proximal to distal. Again he seems to have motor planning deficits and some inattention to the left side. Left alien arm appearance. Decreased light touch and proprioception also noted left arm and leg.   He is hyperreflexive on the right side.  No changes in neuro exam today Psych: Pleasant      Assessment/Plan: 1.  Functional deficits secondary to intracranial hemorrhage which require 3+ hours per day of interdisciplinary therapy in a comprehensive inpatient rehab setting. Physiatrist is providing close team supervision and 24 hour management of active medical problems listed below. Physiatrist and rehab team continue to assess barriers to discharge/monitor patient progress toward functional and medical goals.  Function:  Bathing Bathing position      Bathing parts      Bathing assist        Upper Body Dressing/Undressing Upper body dressing                    Upper body assist        Lower Body Dressing/Undressing Lower body dressing                                  Lower body assist        Toileting Toileting          Toileting assist     Transfers Chair/bed transfer              Locomotion Ambulation           Wheelchair          Cognition Comprehension    Expression    Social Interaction    Problem Solving    Memory     Medical Problem List and Plan: 1.Old residual right hemiparesis with new onset left hemiparesis as well as left hemisensory deficits and neglectsecondary to acute left frontal and left temporal ICH with recurrent cortical ICH suspect due to cerebral amyloid angiopathy -Beginning therapies today 2. DVT Prophylaxis/Anticoagulation: SCDs. Monitor for any signs of DVT 3. Pain Management:Baclofen 20 mg 3 times a day left shoulder tender with palpation and basic abduction and rotation.   Left shoulder pain likely due to rotator cuff-tendinopathy and AC joint arthritis   -He has chronic right knee pain due to osteoarthritis   - add Voltaren gel to both areas.  Consider joint injection right knee 4. Mood:Provide emotional support 5. Neuropsych: This patientiscapable of making decisions on hisown behalf. 6. Skin/Wound Care:Routine skin checks 7. 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 EEG 11/10/2017 negative. Initially on Keppra changed to  Depakote 500 mg twice a day 01/15/2019as well as Vimpat 100 mg twice a day   -Explained to patient and wife that he likely will be somewhat drowsy from the increases in addition of these medications.  We will will try to work through for now   -Could consider stimulant during the day to help combat this but I would like to avoid that 9.Hypertension. No current antihypertensive medication. Monitor with increased mobility 10.COPD with history of tobacco abuse in the past. Continue Dulera   LOS (Days) 1 A FACE TO FACE EVALUATION WAS PERFORMED  Faith RogueSWARTZ,Taeshaun Rames T, MD 12/11/2017 9:00 AM

## 2017-12-12 ENCOUNTER — Inpatient Hospital Stay (HOSPITAL_COMMUNITY): Payer: Medicare HMO

## 2017-12-12 ENCOUNTER — Encounter (HOSPITAL_COMMUNITY): Payer: Self-pay

## 2017-12-12 LAB — CULTURE, BLOOD (ROUTINE X 2)
CULTURE: NO GROWTH
Culture: NO GROWTH
SPECIAL REQUESTS: ADEQUATE
Special Requests: ADEQUATE

## 2017-12-12 LAB — URINALYSIS, COMPLETE (UACMP) WITH MICROSCOPIC
Bilirubin Urine: NEGATIVE
Glucose, UA: NEGATIVE mg/dL
Hgb urine dipstick: NEGATIVE
Ketones, ur: 5 mg/dL — AB
Leukocytes, UA: NEGATIVE
Nitrite: NEGATIVE
PH: 5 (ref 5.0–8.0)
Protein, ur: NEGATIVE mg/dL
SPECIFIC GRAVITY, URINE: 1.028 (ref 1.005–1.030)
Squamous Epithelial / LPF: NONE SEEN

## 2017-12-12 LAB — VALPROIC ACID LEVEL: VALPROIC ACID LVL: 55 ug/mL (ref 50.0–100.0)

## 2017-12-12 MED ORDER — FLUCONAZOLE 100 MG PO TABS
100.0000 mg | ORAL_TABLET | Freq: Every day | ORAL | Status: DC
  Administered 2017-12-13 – 2017-12-17 (×5): 100 mg via ORAL
  Filled 2017-12-12 (×5): qty 1

## 2017-12-12 MED ORDER — NYSTATIN 100000 UNIT/GM EX POWD
Freq: Two times a day (BID) | CUTANEOUS | Status: DC
  Administered 2017-12-12 – 2017-12-31 (×38): via TOPICAL
  Filled 2017-12-12 (×2): qty 15

## 2017-12-12 MED ORDER — FLUCONAZOLE 100 MG PO TABS
200.0000 mg | ORAL_TABLET | Freq: Once | ORAL | Status: AC
  Administered 2017-12-12: 200 mg via ORAL
  Filled 2017-12-12: qty 2

## 2017-12-12 NOTE — Progress Notes (Signed)
[place]IC[facility] & REHABILITATION     PROGRESS NOTE    Subjective/Complaints: Developed significant rash early yesterday.  Patient also with low-grade temperature.  Feeling a little bit better today with change in sheets and with hydrocortisone cream.  Able to sleep somewhat.  Right knee remains problematic  ROS: pt denies nausea, vomiting, diarrhea, cough, shortness of breath or chest pain   Objective: Vital Signs: Blood pressure 123/72, pulse 82, temperature 97.8 F (36.6 C), temperature source Oral, resp. rate 18, height 6' (1.829 m), weight 79.3 kg (174 lb 13.2 oz), SpO2 93 %. Dg Shoulder Left Port  Result Date: 12/10/2017 CLINICAL DATA:  Left shoulder pain beginning last night. No known injury. EXAM: LEFT SHOULDER - 1 VIEW COMPARISON:  None. FINDINGS: There is no acute bony or joint abnormality. The calcification over the greater tuberosity is consistent with calcific rotator cuff tendinopathy. Mild acromioclavicular degenerative change is seen. The glenohumeral joint is unremarkable. Imaged left lung and ribs appear normal. The patient is status post CABG. Aortic atherosclerosis is noted. IMPRESSION: No acute finding. Calcific rotator cuff tendinopathy. Mild acromioclavicular osteoarthritis. Atherosclerosis. Electronically Signed   By: Drusilla Kanner M.D.   On: 12/10/2017 12:04   Recent Labs    12/10/17 0731  WBC 11.1*  HGB 13.3  HCT 38.6*  PLT 256   Recent Labs    12/10/17 0731  NA 134*  K 3.7  CL 99*  GLUCOSE 112*  BUN 13  CREATININE 0.67  CALCIUM 8.8*   CBG (last 3)  No results for input(s): GLUCAP in the last 72 hours.  Wt Readings from Last 3 Encounters:  12/10/17 79.3 kg (174 lb 13.2 oz)  12/07/17 81.4 kg (179 lb 7.3 oz)  12/03/17 79.8 kg (176 lb)    Physical Exam:  Constitutional: He isoriented to person, place, and time.Mood is a bit flat but appropriate HENT:  Head:Normocephalic.  Eyes:EOMI Neck:Normal range of motion.Neck  supple.No thyromegalypresent.  Cardiovascular:RRR without murmur. No JVD    Respiratory:CTA Bilaterally without wheezes or rales. Normal effort   NF:AOZH.Bowel sounds are normal. He exhibitsno distension Skin.  Diffuse macular rash over buttock and back.  Slightly warm.  Fairly demarcated around edges Musc: Left shoulder tender to abduction as well as basic rotation.  He has some pain over the left AC joint.  Right knee has some generalized effusion and tenderness along the joint line.  Has a hard time extending right knee fully Neurological: He is  oriented to person, place, and time.   Speech mildly dysarthric.  Fair insight and awareness.  Remains drowsy.  Motor exam essentially to minus 2 out of 5 right deltoid biceps triceps. Wrists essentially 2+ to 3- out of 5. Hand is similar. Right lower extremity grossly 2+ to 3 out of 5 proximal distal with difficulty and motor sequencing. Left upper extremity grossly 3 out of 5 to 3+ out of 5 proximal to distal. Left lower extremity grossly 3+ out of 5 proximal to distal. Again he seems to have motor planning deficits and some inattention to the left side. Left alien arm appearance. Decreased light touch and proprioception also noted left arm and leg.   He is hyperreflexive on the right side.  No changes in neuro exam today Psych: Flat but pleasant      Assessment/Plan: 1.  Functional deficits secondary to intracranial hemorrhage which require 3+ hours per day of interdisciplinary therapy in a comprehensive inpatient rehab setting. Physiatrist is providing close team supervision and 24 hour  management of active medical problems listed below. Physiatrist and rehab team continue to assess barriers to discharge/monitor patient progress toward functional and medical goals.  Function:  Bathing Bathing position      Bathing parts   Body parts bathed by helper: Right arm, Left arm, Front perineal area, Buttocks, Right upper leg, Left  upper leg, Right lower leg, Left lower leg, Back, Chest, Abdomen  Bathing assist Assist Level: 2 helpers(+2 A to wash buttocks while OT rolls pt )      Upper Body Dressing/Undressing Upper body dressing   What is the patient wearing?: Pull over shirt/dress       Pull over shirt/dress - Perfomed by helper: Thread/unthread right sleeve, Thread/unthread left sleeve, Put head through opening, Pull shirt over trunk        Upper body assist        Lower Body Dressing/Undressing Lower body dressing   What is the patient wearing?: Pants, Non-skid slipper socks       Pants- Performed by helper: Thread/unthread right pants leg, Thread/unthread left pants leg, Pull pants up/down   Non-skid slipper socks- Performed by helper: Don/doff right sock, Don/doff left sock                  Lower body assist Assist for lower body dressing: 2 Helpers      Financial traderToileting Toileting Toileting activity did not occur: No continent bowel/bladder event        Toileting assist     Transfers Chair/bed transfer Chair/bed transfer activity did not occur: Safety/medical concerns           Locomotion Ambulation Ambulation activity did not occur: Safety/medical Investment banker, operationalconcerns         Wheelchair Wheelchair activity did not occur: Safety/medical concerns Type: Manual      Cognition Comprehension Comprehension assist level: Understands basic 50 - 74% of the time/ requires cueing 25 - 49% of the time  Expression Expression assist level: Expresses basis less than 25% of the time/requires cueing >75% of the time.  Social Interaction Social Interaction assist level: Interacts appropriately 25 - 49% of time - Needs frequent redirection.  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 DVT Prophylaxis/Anticoagulation: SCDs. Monitor for any signs of DVT 3. Pain Management:Baclofen 20 mg 3 times a day left shoulder tender with palpation and basic abduction and rotation.   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  Consider joint injection right knee 4. Mood:Provide emotional support 5. Neuropsych: This patientiscapable of making decisions on hisown behalf. 6. Skin/Wound Care:He has developed a rash similar to what he experienced during his last day.  The rash has a yeasty appearance but also likely is a contact rash as well.   -Have already added hydrocortisone cream and as needed Benadryl.  Do want to be careful with Benadryl given further sedation   -  Give 200 mg Diflucan today and follow-up with 100 mg daily thereafter.  I added nystatin powder as well   -Continue hypoallergenic sheets 7. 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 EEG 11/10/2017 negative. Initially on Keppra changed to Depakote 500 mg twice a day 01/15/2019as well as Vimpat 100 mg twice a day   -Explained to patient and wife that he likely will be somewhat drowsy from the increases in addition of these medications.      -Could consider stimulant during the day to help combat this but I would like to avoid that 9.Hypertension. No current antihypertensive medication. Monitor with increased mobility 10.COPD with history of tobacco abuse in the past. Continue Dulera 11.   Low-grade temperature, likely related to #6.  Have requested urinalysis and culture as well  LOS (Days) 2 A FACE TO FACE EVALUATION WAS PERFORMED  Ranelle Oyster, MD 12/12/2017 8:34 AM

## 2017-12-12 NOTE — Progress Notes (Signed)
Occupational Therapy Session Note  Patient Details  Name: Jaime Pierce MRN: [id number] Date of Birth: 09/24/1946  Today's Date: 12/12/2017 OT Individual Time: 1400-1456 OT Individual Time Calculation (min): 56 min    Short Term Goals: Week 1:  OT Short Term Goal 1 (Week 1): Pt will sit to stand in stedy with MAX A of 1 in prep for functional transfers OT Short Term Goal 2 (Week 1): Pt will maintain dynamic sitting balance with no more than CGA in prep for LB dressing OT Short Term Goal 3 (Week 1): Pt will recall hemi techniques with min VC OT Short Term Goal 4 (Week 1): Pt will groom at sink with MOD A  Skilled Therapeutic Interventions/Progress Updates:    1;1. Pt already dressed by wife/nursing, supine in bed, ready to go. Pt with improved arousal today. Pt still reports pain with mobility especially in R knee which is visibly swollen. RN aware. Pt total A supine<>sit throuhgout session. OT initiated slide board transfer training with MOD A (down hill to L) and w/c>EOB MAX A (downhill to R) with VC for head hips relationship, manual facilitation of trunk flexion and +2 to stedy board/w/c. After significant time spent repositioning hips so feet maintain contact with floor for safety, pt stands in standing frame for BLE/RUE weight bearing for tone reduction while working on gross grasp and pinch activities with LUE. Pt with apraxic movement/poor spatial awareness when moving L hand to grasp cup and hand to wife and flip over medium sized block. Pt required cuing for opening/closing hand as well as supinating hand by saying, "turn your thumb up to the ceiling." Pt returned to room and bed as stated above. Exited session with pt seated in bed, call light in reach and all need met.  Therapy Documentation Precautions:  Precautions Precautions: Fall Precaution Comments: impaired procioceptive awareness bil.  Restrictions Weight Bearing Restrictions: No  See Function Navigator for Current  Functional Status.   Therapy/Group: Individual Therapy  Tonny Branch 12/12/2017, 2:58 PM

## 2017-12-13 ENCOUNTER — Inpatient Hospital Stay (HOSPITAL_COMMUNITY): Payer: Medicare HMO | Admitting: Physical Therapy

## 2017-12-13 ENCOUNTER — Inpatient Hospital Stay (HOSPITAL_COMMUNITY): Payer: Medicare HMO

## 2017-12-13 ENCOUNTER — Inpatient Hospital Stay (HOSPITAL_COMMUNITY): Payer: Medicare HMO | Admitting: Speech Pathology

## 2017-12-13 ENCOUNTER — Inpatient Hospital Stay (HOSPITAL_COMMUNITY): Payer: Medicare HMO | Admitting: Occupational Therapy

## 2017-12-13 LAB — CBC WITH DIFFERENTIAL/PLATELET
Basophils Absolute: 0.1 10*3/uL (ref 0.0–0.1)
Basophils Relative: 1 %
Eosinophils Absolute: 0.9 10*3/uL — ABNORMAL HIGH (ref 0.0–0.7)
Eosinophils Relative: 9 %
HEMATOCRIT: 40.4 % (ref 39.0–52.0)
Hemoglobin: 13.7 g/dL (ref 13.0–17.0)
LYMPHS ABS: 1.1 10*3/uL (ref 0.7–4.0)
LYMPHS PCT: 11 %
MCH: 30.5 pg (ref 26.0–34.0)
MCHC: 33.9 g/dL (ref 30.0–36.0)
MCV: 90 fL (ref 78.0–100.0)
MONO ABS: 1.7 10*3/uL — AB (ref 0.1–1.0)
MONOS PCT: 17 %
NEUTROS ABS: 6.1 10*3/uL (ref 1.7–7.7)
Neutrophils Relative %: 62 %
Platelets: 272 10*3/uL (ref 150–400)
RBC: 4.49 MIL/uL (ref 4.22–5.81)
RDW: 14.1 % (ref 11.5–15.5)
WBC: 9.8 10*3/uL (ref 4.0–10.5)

## 2017-12-13 LAB — COMPREHENSIVE METABOLIC PANEL
ALT: 24 U/L (ref 17–63)
AST: 22 U/L (ref 15–41)
Albumin: 2.8 g/dL — ABNORMAL LOW (ref 3.5–5.0)
Alkaline Phosphatase: 47 U/L (ref 38–126)
Anion gap: 12 (ref 5–15)
BILIRUBIN TOTAL: 1 mg/dL (ref 0.3–1.2)
BUN: 20 mg/dL (ref 6–20)
CALCIUM: 8.9 mg/dL (ref 8.9–10.3)
CO2: 24 mmol/L (ref 22–32)
Chloride: 98 mmol/L — ABNORMAL LOW (ref 101–111)
Creatinine, Ser: 0.62 mg/dL (ref 0.61–1.24)
GFR calc Af Amer: >60 mL/min (ref >60)
GFR calc non Af Amer: >60 mL/min (ref >60)
Glucose, Bld: 100 mg/dL — ABNORMAL HIGH (ref 65–99)
POTASSIUM: 4.1 mmol/L (ref 3.5–5.1)
Sodium: 134 mmol/L — ABNORMAL LOW (ref 135–145)
TOTAL PROTEIN: 6.3 g/dL — AB (ref 6.5–8.1)

## 2017-12-13 LAB — VALPROIC ACID LEVEL: Valproic Acid Lvl: 50 ug/mL (ref 50.0–100.0)

## 2017-12-13 LAB — URINE CULTURE

## 2017-12-13 NOTE — Therapy (Signed)
[facility] [address] [place], [place], [address] Phone: [phone]   Fax:  [phone]  Patient Details  Name: Jaime Pierce MRN: [id number] Date of Birth: 1945-12-15 Referring Provider:  Dr. Donetta Potts Encounter Date: 12/13/2017  OCCUPATIONAL THERAPY DISCHARGE SUMMARY  Visits from Start of Care: 17  Current functional level related to goals / functional outcomes: OT Short Term Goals - 11/30/17 1428      OT SHORT TERM GOAL #1   Title  Independent with initial HEP     Status  On-going      OT SHORT TERM GOAL #2   Title  Pt/wife to verbalize understanding with edema management (including compression glove) and safety considerations w/ lack of sensation Rt hand    Status  Achieved      OT SHORT TERM GOAL #3   Title  Pt to demo 25 degrees shoulder flexion in prep for low level reaching RUE    Status  Achieved      OT SHORT TERM GOAL #4   Title  Pt to consistently be mod I level for dressing and bathing with A/E and task modifications prn    Status  On-going      OT SHORT TERM GOAL #5   Title  Pt to use Rt hand to feed self finger foods 50% of the time    Status  On-going      OT SHORT TERM GOAL #6   Title  Pt to perform shower transfer with CGA only using DME prn    Status  On-going      Pt had not met any LTG's secondary to rehospitalization   Remaining deficits: Same as initial evaluation   Education / Equipment: HEP, safe/proper positioning of RUE  Plan: Patient agrees to discharge.  Patient goals were partially met. Patient is being discharged due to a change in medical status.  Pt was re-admitted to hospital for new Lt sided weakness on 12/06/16. MRI confirmed new CVA. Pt now admitted to CIR for inpatient rehab?????         Carey Bullocks, OTR/L 12/13/2017, 8:15 AM  [facility] [address] [place], [place],  [address] Phone: [phone]   Fax:  [phone]

## 2017-12-13 NOTE — Progress Notes (Signed)
Physical Therapy Session Note  Patient Details  Name: Jaime Pierce MRN: [id number] Date of Birth: 30-Dec-1945  Today's Date: 12/13/2017 PT Individual Time: 1135-1200 PT Individual Time Calculation (min): 25 min   Short Term Goals: Week 1:  PT Short Term Goal 1 (Week 1): Pt will perform bed mobility modA supine <>sit PT Short Term Goal 2 (Week 1): Pt will perform lateral scoot transfers consistent modA PT Short Term Goal 3 (Week 1): Pt will perform sit <>stand maxA +1 PT Short Term Goal 4 (Week 1): Pt will demonstrate OOB tolerance >4hours/day outside of therapy session  Skilled Therapeutic Interventions/Progress Updates:    Pt reclined in TIS w/c in room upon therapist arrival, pt is agreeable to participate in therapy session. Pt reports no pain this AM. Attempt sliding board transfer w/c to mat to the L, pt unable to initiate proper weight shift forwards to initiate transfer and needs hand-over-hand assist to keep LUE in correct placement for transfer. Pt exhibits significant tone in BUE. Sitting balance in wheelchair with armrests removed leaning L/R onto hip level surface for UE WBing and returning to midline with manual cues for lateral trunk flexor initiation, mirror for visual feedback to return to midline. Pt left seated in TIS w/c in room with needs in reach, wife present.  Therapy Documentation Precautions:  Precautions Precautions: Fall Precaution Comments: impaired procioceptive awareness bil.  Restrictions Weight Bearing Restrictions: No  See Function Navigator for Current Functional Status.   Therapy/Group: Individual Therapy  Peter Congoaylor Vasco Chong, PT, DPT  12/13/2017, 12:14 PM

## 2017-12-13 NOTE — Progress Notes (Signed)
Occupational Therapy Session Note  Patient Details  Name: Jaime Pierce MRN: [id number] Date of Birth: Jul 01, 1946  Today's Date: 12/13/2017 OT Individual Time: 1000-1100 OT Individual Time Calculation (min): 60 min    Short Term Goals: Week 1:  OT Short Term Goal 1 (Week 1): Pt will sit to stand in stedy with MAX A of 1 in prep for functional transfers OT Short Term Goal 2 (Week 1): Pt will maintain dynamic sitting balance with no more than CGA in prep for LB dressing OT Short Term Goal 3 (Week 1): Pt will recall hemi techniques with min VC OT Short Term Goal 4 (Week 1): Pt will groom at sink with MOD A  Skilled Therapeutic Interventions/Progress Updates:    Pt resting in w/c upon arrival with wife present.  OT intervention with focus on RUE PROM/stretching, Kinesio Tape application to R hand for edema management, and functional use of LUE.  Half lap tray modified for LUE positioning when in w/c and stretching provided to facilitate correct positioning.  Increased tone noted in RUE and internal rotators.  Pt engaged in LUE/hand reaching tasks with focus on grasp and release.  Light touch in LUE absent.  Educated pt on compensatory strategies when using LUE for functional tasks.  Apraxic movements noted when reaching and grasping objects.  Pt remained in w/c with wife presnt.   Therapy Documentation Precautions:  Precautions Precautions: Fall Precaution Comments: impaired procioceptive awareness bil.  Restrictions Weight Bearing Restrictions: No  Pain:  Pt denies pain  See Function Navigator for Current Functional Status.   Therapy/Group: Individual Therapy  Jaime Pierce, Jaime Pierce 12/13/2017, 12:11 PM

## 2017-12-13 NOTE — Progress Notes (Signed)
Physical Therapy Session Note  Patient Details  Name: Jaime Pierce MRN: [id number] Date of Birth: February 04, 1946  Today's Date: 12/13/2017 PT Individual Time: 0800-0900 PT Individual Time Calculation (min): 60 min   Short Term Goals: Week 1:  PT Short Term Goal 1 (Week 1): Pt will perform bed mobility modA supine <>sit PT Short Term Goal 2 (Week 1): Pt will perform lateral scoot transfers consistent modA PT Short Term Goal 3 (Week 1): Pt will perform sit <>stand maxA +1 PT Short Term Goal 4 (Week 1): Pt will demonstrate OOB tolerance >4hours/day outside of therapy session  Skilled Therapeutic Interventions/Progress Updates: Pt received supine in bed with wife present, denies pain at rest but does verbalize and demonstrate signs of pain with movement of R knee. MD present at beginning of session discussing pain and options for treatment at beginning of session. Rolling R/L modA and slow speed/initiation d/t tone and R knee pain; brief donned with rolling and pants donned totalA with bridging to pull pants over hips. Supine>sit with pt initiating movement of LEs off side of bed, maxA to bring trunk to midline. Sitting balance on edge of mat S while donning shirt maxA. Slideboard transfer maxA to bed>w/c d/t apraxia, extensor tone. Transfer w/c >mat table with modA, improving anterior weight shift and maintaining placement of L hand during transfer. Utilized bolster between B feet and knees for the following activities to reduce flexor/adductor tone/spasticity and neutralize pelvis. R/L lateral leans to prop on elbow while performing contralateral reaching for midline orientation, trunk lengthening; pt fearful of leaning either direction with fear of falling and mild pusher syndrome noted. Forward weight shifts with LUE on stability ball; RUE supported by therapist d/t hypertonicity and ROM restrictions. Sit <>stand from elevated mat table, maxA +2 on initial trial, reduced to minA +2 by final trial.  Cued pt to recall anterior weight shift performed with stability ball pushes. Utilized Production assistant, radiomirror visual feedback for midline orientation and postural alignment; requires cues for hip/trunk extension. Once in standing, pt able to demo static standing balance with min guard +1. Cues for forward trunk flexion during stand>sit to improve eccentric control. Returned to w/c minA with transfer board to L side. Remained semi-reclined in tilt in space w/c with handoff to SLP at end of session.     Therapy Documentation Precautions:  Precautions Precautions: Fall Precaution Comments: impaired procioceptive awareness bil.  Restrictions Weight Bearing Restrictions: No   See Function Navigator for Current Functional Status.   Therapy/Group: Individual Therapy  [name] 12/13/2017, 9:19 AM

## 2017-12-13 NOTE — Progress Notes (Signed)
[place] PHYSICAL MEDICINE & REHABILITATION     PROGRESS NOTE    Subjective/Complaints: Slept well last night.  No temperature noted.  Still struggling with seizure medications from a sedation standpoint  ROS: pt denies nausea, vomiting, diarrhea, cough, shortness of breath or chest pain   Objective: Vital Signs: Blood pressure 129/72, pulse 85, temperature (!) 97.5 F (36.4 C), temperature source Oral, resp. rate 18, height 6' (1.829 m), weight 79.3 kg (174 lb 13.2 oz), SpO2 94 %. No results found. Recent Labs    12/13/17 0729  WBC 9.8  HGB 13.7  HCT 40.4  PLT 272   No results for input(s): NA, K, CL, GLUCOSE, BUN, CREATININE, CALCIUM in the last 72 hours.  Invalid input(s): CO CBG (last 3)  No results for input(s): GLUCAP in the last 72 hours.  Wt Readings from Last 3 Encounters:  12/10/17 79.3 kg (174 lb 13.2 oz)  12/07/17 81.4 kg (179 lb 7.3 oz)  12/03/17 79.8 kg (176 lb)    Physical Exam:  Constitutional: He isoriented to person, place, and time.Mood is a bit flat but appropriate HENT:  Head:Normocephalic.  Eyes:EOMI Neck:Normal range of motion.Neck supple.No thyromegalypresent.  Cardiovascular:RRR without murmur. No JVD     Respiratory: CTA Bilaterally without wheezes or rales. Normal effort   RU:EAVW.Bowel sounds are normal. He exhibitsno distension Skin.  Macular rash less intense around back and buttock areas. Musc: Left shoulder tender to abduction as well as basic rotation.  He has some pain over the left AC joint.  Right knee has some generalized effusion and tenderness along the joint line.  Has a hard time extending right knee fully Neurological: He is  oriented to person, place, and time.   Speech mildly dysarthric.  Fair insight and awareness.  Much more alert today motor exam essentially to minus 2 out of 5 right deltoid biceps triceps. Wrists essentially 2+ to 3- out of 5. Hand is similar. Right lower extremity grossly 2+ to 3 out of  5 proximal distal with difficulty and motor sequencing. Left upper extremity grossly 3 out of 5 to 3+ out of 5 proximal to distal. Left lower extremity grossly 3+ out of 5 proximal to distal. Continue slow initiation with left arm.  Appears to be more aware of it today. Psych: Flat but pleasant      Assessment/Plan: 1.  Functional deficits secondary to intracranial hemorrhage which require 3+ hours per day of interdisciplinary therapy in a comprehensive inpatient rehab setting. Physiatrist is providing close team supervision and 24 hour management of active medical problems listed below. Physiatrist and rehab team continue to assess barriers to discharge/monitor patient progress toward functional and medical goals.  Function:  Bathing Bathing position      Bathing parts   Body parts bathed by helper: Right arm, Left arm, Front perineal area, Buttocks, Right upper leg, Left upper leg, Right lower leg, Left lower leg, Back, Chest, Abdomen  Bathing assist Assist Level: 2 helpers(+2 A to wash buttocks while OT rolls pt )      Upper Body Dressing/Undressing Upper body dressing   What is the patient wearing?: Pull over shirt/dress       Pull over shirt/dress - Perfomed by helper: Thread/unthread right sleeve, Thread/unthread left sleeve, Put head through opening, Pull shirt over trunk        Upper body assist        Lower Body Dressing/Undressing Lower body dressing   What is the patient wearing?: Pants, Non-skid slipper socks  Pants- Performed by helper: Thread/unthread right pants leg, Thread/unthread left pants leg, Pull pants up/down   Non-skid slipper socks- Performed by helper: Don/doff right sock, Don/doff left sock                  Lower body assist Assist for lower body dressing: 2 Helpers      Toileting Toileting Toileting activity did not occur: No continent bowel/bladder event        Toileting assist     Transfers Chair/bed transfer  Chair/bed transfer activity did not occur: Safety/medical concerns           Locomotion Ambulation Ambulation activity did not occur: Safety/medical Investment banker, operationalconcerns         Wheelchair Wheelchair activity did not occur: Safety/medical concerns Type: Manual      Cognition Comprehension Comprehension assist level: Understands basic 50 - 74% of the time/ requires cueing 25 - 49% of the time  Expression Expression assist level: Expresses basis less than 25% of the time/requires cueing >75% of the time.  Social Interaction Social Interaction assist level: Interacts appropriately 25 - 49% of time - Needs frequent redirection.  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 DVT Prophylaxis/Anticoagulation: SCDs. Monitor for any signs of DVT 3. Pain Management:Baclofen 20 mg 3 times a day left shoulder tender with palpation and basic abduction and rotation.   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     -Consider joint injection right knee tomorrow. 4. Mood:Provide emotional support 5. Neuropsych: This patientiscapable of making decisions on hisown behalf. 6. Skin/Wound Care:He has developed a rash similar to what he experienced during his last day.  The rash has a yeasty appearance but also likely is a  contact rash as well.  It appears improved today   -Continue hydrocortisone cream Benadryl and hypoallergenic sheets   -Diflucan 100 mg daily with nystatin powder as well     7. Fluids/Electrolytes/Nutrition:Continue to encourage p.o. 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 EEG 11/10/2017 negative. Initially on Keppra changed to Depakote 500 mg twice a day 01/15/2019as well as Vimpat 100 mg twice a day   -Explained to patient and wife that he likely will be somewhat drowsy from the increases in addition of these medications.      -Could consider stimulant during the day to help combat this but I would like to avoid that   -He is much more alert today, observe for now 9.Hypertension. No current antihypertensive medication. Monitor with increased mobility 10.COPD with history of tobacco abuse in the past. Continue Dulera 11.  Low-grade temperature, likely related to #6.  Urinalysis unremarkable thus far.  Urine culture pending  LOS (Days) 3 A FACE TO FACE EVALUATION WAS PERFORMED  Ranelle OysterSWARTZ,Gino Garrabrant T, MD 12/13/2017 8:50 AM

## 2017-12-13 NOTE — Progress Notes (Signed)
Occupational Therapy Session Note  Patient Details  Name: Jaime Pierce MRN: [id number] Date of Birth: [birth year]  Today's Date: 12/13/2017 OT Individual Time: [id number] OT Individual Time Calculation (min): 34 min   Short Term Goals: Week 1:  OT Short Term Goal 1 (Week 1): Pt will sit to stand in stedy with MAX A of 1 in prep for functional transfers OT Short Term Goal 2 (Week 1): Pt will maintain dynamic sitting balance with no more than CGA in prep for LB dressing OT Short Term Goal 3 (Week 1): Pt will recall hemi techniques with min VC OT Short Term Goal 4 (Week 1): Pt will groom at sink with MOD A  Skilled Therapeutic Interventions/Progress Updates:    Pt greeted in bathroom with NT and spouse. Using Stedy to transfer back to w/c post BM. Pt appearing alert and active in assisting during power up into device. Noted increased R LE adductor tone. Once seated in w/c, worked on UE/LE NMR with LB dressing tasks and lotion application to LEs. Pt with dysmetria when reaching for lotion bottle, HOH for holding container to uncap it. Ataxic Lt hand movements when applying lotion to upper thighs. He was unable to maintain Lt grasp on pants to assist with threading each LE into pant leg. He did elevate each leg and generate force to push downwards in order to assist with footwear. Sit<stand in [place] with 2 helpers for power up. Once standing, pants were elevated over hips by therapist. Pt responding well to cues for midline orientation, improving LE alignment, and forward gaze. Pt able to read wall clock, signs in rooms, and distinguish colors of towels on dresser accurately. After 4 minutes, pt returned to sitting in w/c. Educated pt/spouse on NMR activities to prevent Rt shoulder subluxation, manage contracture, and preserve tenodesis grasping pattern. At end of tx pt was reclined in TIS with half lap tray and spouse present.   He's very motivated to gain back his functional independence!     Therapy Documentation Precautions:  Precautions Precautions: Fall Precaution Comments: impaired procioceptive awareness bil.  Restrictions Weight Bearing Restrictions: No Vital Signs: Therapy Vitals Temp: 97.8 F (36.6 C) Temp Source: Oral Pulse Rate: 88 Resp: 18 BP: (!) 141/69(had just moved back to bed) Patient Position (if appropriate): Lying Oxygen Therapy SpO2: 95 % O2 Device: Not Delivered Pain: No c/o pain during tx   ADL:      See Function Navigator for Current Functional Status.  Therapy/Group: Individual Therapy  Jaime Pierce 12/13/2017, 3:40 PM

## 2017-12-13 NOTE — IPOC Note (Signed)
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 (HCC)   Benign essential HTN   Intracerebral hemorrhage     Functional Problem List: Nursing Endurance, Medication Management, Safety  PT Balance, Behavior, Endurance, Motor, Nutrition, Pain, Perception, Safety, Sensory, Skin Integrity, Other (comment)  OT Balance, Cognition, Endurance, Motor, Pain, Perception, Safety, Sensory, Vision, Skin Integrity  SLP Cognition  TR         Basic ADL's: OT Eating, Grooming, Bathing, Dressing, Toileting     Advanced  ADL's: OT       Transfers: PT Bed Mobility, Bed to Chair, Car, Occupational psychologist, Research scientist (life sciences): PT Ambulation, Psychologist, prison and probation services, Stairs     Additional Impairments: OT Fuctional Use of Upper Extremity  SLP        TR      Anticipated Outcomes Item Anticipated Outcome  Self Feeding S  Swallowing  N/A   Basic self-care  MOD A LB dressing; MIN A UB dressing  Toileting  MIN A bathing; MOD A toileting   Bathroom Transfers MIN A toilet; MOD A shower  Bowel/Bladder  Continue to be continent of bowel and bladder  Transfers  Mod Assist with LRAD   Locomotion  Min assist WC mobility, mod assist ambulation with LRAD  Communication  mod I  Cognition  mod I  Pain  Pain will be 3 or less  Safety/Judgment  Pt will adhere to safety plan and free from falls   Therapy Plan: PT Intensity: Minimum of 1-2 x/day ,45 to 90 minutes PT Frequency: 5 out of 7 days PT Duration Estimated Length of Stay: 4 weeks  OT Intensity: Minimum of 1-2 x/day, 45 to 90 minutes OT Frequency: 5 out of 7 days OT Duration/Estimated Length of Stay: 25-28 SLP Intensity: Minumum of 1-2  x/day, 30 to 90 minutes SLP Frequency: 3 to 5 out of 7 days SLP Duration/Estimated Length of Stay: 4 weeks    Team Interventions: Nursing Interventions Disease Management/Prevention, Medication Management, Patient/Family Education  PT interventions Ambulation/gait training, Cognitive remediation/compensation, Discharge planning, Functional mobility training, DME/adaptive equipment instruction, Pain management, Psychosocial support, Splinting/orthotics, Therapeutic Activities, UE/LE Strength taining/ROM, Visual/perceptual remediation/compensation, Warden/ranger, Community reintegration, Disease management/prevention, Development worker, international aid stimulation, Neuromuscular re-education, Patient/family education, Skin care/wound management, Stair training, Therapeutic Exercise, UE/LE Coordination activities, Wheelchair propulsion/positioning  OT Interventions Warden/ranger, Community reintegration, Disease mangement/prevention, Development worker, international aid stimulation, Neuromuscular re-education, Patient/family education, Splinting/orthotics, Self Care/advanced ADL retraining, Therapeutic Exercise, UE/LE Coordination activities, Wheelchair propulsion/positioning, Cognitive remediation/compensation, Discharge planning, DME/adaptive equipment instruction, Functional mobility training, Pain management, Psychosocial support, Skin care/wound managment, Therapeutic Activities, UE/LE Strength taining/ROM, Visual/perceptual remediation/compensation  SLP Interventions Cognitive remediation/compensation, Cueing hierarchy, Functional tasks, Internal/external aids, Patient/family education, Therapeutic Activities, Therapeutic Exercise  TR Interventions    SW/CM Interventions Discharge Planning, Psychosocial Support, Patient/Family Education   Barriers to Discharge MD  Medical stability  Nursing      PT Inaccessible home environment, Medical stability, Home environment access/layout    OT   unsure  if family will be able to provide needed physical assistance at d/c  SLP      SW       Team Discharge Planning: Destination: PT-Home ,OT- Home , SLP-Home Projected Follow-up: PT-Home health PT, OT-  Home health OT, SLP-Home Health  SLP Projected Equipment Needs: PT-To be determined, Wheelchair (measurements), Wheelchair cushion (measurements), OT- To be determined, SLP-None recommended by SLP Equipment Details: PT- , OT-  Patient/family involved in discharge planning: PT- Patient, Family member/caregiver,  OT-Patient, Family member/caregiver, SLP-Family member/caregiver, Patient unable/family or caregive not available  MD ELOS: 25-28 Medical Rehab Prognosis:  Excellent Assessment: The patient has been admitted for CIR therapies with the diagnosis of intracranial hemorrhage. The team will be addressing functional mobility, strength, stamina, balance, safety, adaptive techniques and equipment, self-care, bowel and bladder mgt, patient and caregiver education, visual spatial awareness, cognition, communication, orthotics, ego support. Goals have been set at minimal to moderate assistance for basic self-care, ADLs, mobility, transfers and modified independent for cognition and communication.    Jaime OysterZachary T. Jiselle Sheu, MD, FAAPMR      See Team Conference Notes for weekly updates to the plan of care

## 2017-12-13 NOTE — Progress Notes (Signed)
Speech Language Pathology Daily Session Note  Patient Details  Name: Jaime Pierce MRN: [id number] Date of Birth: 11-08-1946  Today's Date: 12/13/2017 SLP Individual Time: 0900-1000 SLP Individual Time Calculation (min): 60 min  Short Term Goals: Week 1: SLP Short Term Goal 1 (Week 1): Pt will maintain alertness for up to 10 minutes with mod stim.  SLP Short Term Goal 2 (Week 1): Pt will answer orientation questions accurately given mod A verbal cues.  SLP Short Term Goal 3 (Week 1): Pt will answer basic wh- questions with mod A verbal cues.  Skilled Therapeutic Interventions: Skilled treatment session focused on cognitive goals. SLP facilitated session by administering the [facility]. Pt scored 13 out of 22 points with a score of 18 and above considered normal. Pt with specific deficits in working memory and selective attention. SLP also facilitated session by providing Mod A verbal cues for problem solving and recall during a basic money management task. Pt's function impacted by inattention to left upper extremity. Pt left upright in chair with all needs within reach and wife present. Continue with current plan of care.   Function:  Cognition Comprehension Comprehension assist level: Understands basic 90% of the time/cues < 10% of the time  Expression   Expression assist level: Expresses basic needs/ideas: With no assist  Social Interaction Social Interaction assist level: Interacts appropriately 75 - 89% of the time - Needs redirection for appropriate language or to initiate interaction.  Problem Solving Problem solving assist level: Solves basic 50 - 74% of the time/requires cueing 25 - 49% of the time  Memory Memory assist level: Recognizes or recalls 50 - 74% of the time/requires cueing 25 - 49% of the time    Pain Pain Assessment Pain Assessment: No/denies pain  Therapy/Group: Individual Therapy  Jaime Pierce  SLP - Student  12/13/2017, 12:49 PM

## 2017-12-14 ENCOUNTER — Inpatient Hospital Stay (HOSPITAL_COMMUNITY): Payer: Medicare HMO

## 2017-12-14 ENCOUNTER — Ambulatory Visit: Payer: Medicare HMO | Admitting: Physical Therapy

## 2017-12-14 ENCOUNTER — Inpatient Hospital Stay (HOSPITAL_COMMUNITY): Payer: Medicare HMO | Admitting: Physical Therapy

## 2017-12-14 ENCOUNTER — Encounter: Payer: Medicare HMO | Admitting: Occupational Therapy

## 2017-12-14 ENCOUNTER — Inpatient Hospital Stay (HOSPITAL_COMMUNITY): Payer: Medicare HMO | Admitting: Speech Pathology

## 2017-12-14 ENCOUNTER — Encounter (HOSPITAL_COMMUNITY): Payer: Self-pay | Admitting: Emergency Medicine

## 2017-12-14 LAB — VALPROIC ACID LEVEL: Valproic Acid Lvl: 60 ug/mL (ref 50.0–100.0)

## 2017-12-14 MED ORDER — TRIAMCINOLONE ACETONIDE 40 MG/ML IJ SUSP
40.0000 mg | Freq: Once | INTRAMUSCULAR | Status: AC
  Administered 2017-12-14: 40 mg via INTRA_ARTICULAR
  Filled 2017-12-14: qty 1

## 2017-12-14 MED ORDER — LIDOCAINE 1% INJECTION FOR CIRCUMCISION
5.0000 mL | INJECTION | Freq: Once | INTRAVENOUS | Status: AC
  Administered 2017-12-14: 5 mL
  Filled 2017-12-14: qty 5

## 2017-12-14 NOTE — Progress Notes (Signed)
Speech Language Pathology Daily Session Note  Patient Details  Name: Jaime Pierce MRN: [id number] Date of Birth: 05-24-1946  Today's Date: 12/14/2017 SLP Individual Time: 0800-0900 SLP Individual Time Calculation (min): 60 min  Short Term Goals: Week 1: SLP Short Term Goal 1 (Week 1): Patient will demonstrate selective attention to a task in a moderately distracting enviornment for ~45 Minutes with Min A verbal cues for redirection.  SLP Short Term Goal 2 (Week 1): Patient will demonstrate functional problem solving for basic and familiar tasks with Mod A verbal cues.  SLP Short Term Goal 3 (Week 1): Patient will recall new and functional information with Mod A verbal cues.  SLP Short Term Goal 4 (Week 1): Patient will self-monitor and correct errors during tasks with Mod A verbal cues.   Skilled Therapeutic Interventions: Skilled treatment session focused on cognitive goals. SLP facilitated session by providing Mod A verbal cues and extra time for problem solving during a 4 step picture sequencing task. Patient demonstrated selective attention to task in a moderately distractive environment with Min A verbal cues for ~30 minutes. Patient left upright in wheelchair with all needs within reach and wife present. Continue with current plan of care.    Function:  Cognition Comprehension Comprehension assist level: Understands complex 90% of the time/cues 10% of the time  Expression   Expression assist level: Expresses complex 90% of the time/cues < 10% of the time  Social Interaction Social Interaction assist level: Interacts appropriately 90% of the time - Needs monitoring or encouragement for participation or interaction.  Problem Solving Problem solving assist level: Solves basic 50 - 74% of the time/requires cueing 25 - 49% of the time  Memory Memory assist level: Recognizes or recalls 75 - 89% of the time/requires cueing 10 - 24% of the time    Pain  No/Denies  pain  Therapy/Group: Individual Therapy  Roxan HockeyYahui Sania Noy  SLP - Student 12/14/2017, 3:41 PM

## 2017-12-14 NOTE — Progress Notes (Signed)
Occupational Therapy Session Note  Patient Details  Name: Jaime Pierce MRN: [id number] Date of Birth: [birth year]  Today's Date: 12/14/2017 OT Individual Time: 1000-1058 OT Individual Time Calculation (min): 58 min    Short Term Goals: Week 1:  OT Short Term Goal 1 (Week 1): Pt will sit to stand in stedy with MAX A of 1 in prep for functional transfers OT Short Term Goal 2 (Week 1): Pt will maintain dynamic sitting balance with no more than CGA in prep for LB dressing OT Short Term Goal 3 (Week 1): Pt will recall hemi techniques with min VC OT Short Term Goal 4 (Week 1): Pt will groom at sink with MOD A  Skilled Therapeutic Interventions/Progress Updates:    Ot intervention with focus on functional transfers (max A with slide board), sitting balance, anterior leans in preparation for sit<>stand, sit<>stand, and LUE functional tasks.  Pt requires max multimodal cues for sequencing with slide board transfers.  Pt initially required max facilitation/tactile cues for anterior leans but later was able to reach forward to grasp objects outside BOS.  Pt engaged in reaching and grasping tasks with LUE with max verbal cues and min A for accuracy and technique.  Pt remained in TIS w/c with all needs within reach and wife present.   Therapy Documentation Precautions:  Precautions Precautions: Fall Precaution Comments: impaired procioceptive awareness bil.  Restrictions Weight Bearing Restrictions: No Pain:  Pt c/o pain in R knee (unrated); MD aware  See Function Navigator for Current Functional Status.   Therapy/Group: Individual Therapy  Jaime Pierce, Jaime Pierce 12/14/2017, 11:53 AM

## 2017-12-14 NOTE — Progress Notes (Signed)
Physical Therapy Session Note  Patient Details  Name: Jaime Pierce MRN: [id number] Date of Birth: 1946/02/05  Today's Date: 12/14/2017 PT Individual Time: 0900-0935 PT Individual Time Calculation (min): 35 min   Short Term Goals: Week 1:  PT Short Term Goal 1 (Week 1): Pt will perform bed mobility modA supine <>sit PT Short Term Goal 2 (Week 1): Pt will perform lateral scoot transfers consistent modA PT Short Term Goal 3 (Week 1): Pt will perform sit <>stand maxA +1 PT Short Term Goal 4 (Week 1): Pt will demonstrate OOB tolerance >4hours/day outside of therapy session  Skilled Therapeutic Interventions/Progress Updates:    Session focused on NMR to address postural control re-training, reorientation to midline, weightbearing and approximation through RUE during trunk control and dynamic sitting balance activities, trunk elongation/activation on R and L, and during functional slideboard transfers. Pt required max assist to R for transfer and min/mod assist to L (pt utilizing LUE more than activation through BLE during transfer back to w/c so requires less assist) with multimodal cues for hand placement, head/hips relationship, and anterior weightshift. Focused heavily on trunk dissociation and elongation/shortening while forcing weightbearing through RUE and performing functional reaching with LUE to R (high, low) and anteriorly. Pt with reluctance for anterior weightshift during all transitional movements and requires extra time, encouragement, and facilitation of anterior weightshift.   Therapy Documentation Precautions:  Precautions Precautions: Fall Precaution Comments: impaired procioceptive awareness bil.  Restrictions Weight Bearing Restrictions: No   Pain:  No reports of pain during session.   See Function Navigator for Current Functional Status.   Therapy/Group: Individual Therapy  Karolee StampsGray, Jaime Pierce  Jacquis Paxton B. Jashay Roddy, PT, DPT  12/14/2017, 9:39 AM

## 2017-12-14 NOTE — Progress Notes (Signed)
Physical Therapy Session Note  Patient Details  Name: Jaime FuelRichard A Pierce MRN: [id number] Date of Birth: 05-23-1946  Today's Date: 12/14/2017 PT Individual Time: 1300-1400 PT Individual Time Calculation (min): 60 min   Short Term Goals: Week 1:  PT Short Term Goal 1 (Week 1): Pt will perform bed mobility modA supine <>sit PT Short Term Goal 2 (Week 1): Pt will perform lateral scoot transfers consistent modA PT Short Term Goal 3 (Week 1): Pt will perform sit <>stand maxA +1 PT Short Term Goal 4 (Week 1): Pt will demonstrate OOB tolerance >4hours/day outside of therapy session  Skilled Therapeutic Interventions/Progress Updates: Pt received supine in bed, denies pain at rest and agreeable to treatment. Supine>sit modA for trunk control and scooting towards EOB. Sit <>stand maxA from elevated bed with stedy. Once in standing in stedy, pt able to demo static standing balance x20-30 sec x5 reps with standbyA; cues for L weight shift and R knee extension in stance. Gait with L hall rail x3 trials at 15-20' each with max/totalA sit <>stand, but modA once in standing to facilitate upright posture, RLE foot placement to reduce scissoring and assist with stance control (cueing glutes and quads). Lateral scoot transfer to R side towards mat with transfer board; modA and decreasing assist toward end of transfer to increase pt assist and initiation. Scooting forward/backward onto low bench in straddled position to facilitate neutral pelvic alignment, reduce adductor tone. Performed dynamic LUE reaching to L side, and forward to facilitate weight shifting, midline orientation. Returned to w/c squat pivot to L side with modA +2. Remained seated in w/c at end of session, MD present and all needs in reach.      Therapy Documentation Precautions:  Precautions Precautions: Fall Precaution Comments: impaired procioceptive awareness bil.  Restrictions Weight Bearing Restrictions: No   See Function Navigator for  Current Functional Status.   Therapy/Group: Individual Therapy  [name] 12/14/2017, 2:07 PM

## 2017-12-14 NOTE — Progress Notes (Signed)
[facility] PHYSICAL MEDICINE & REHABILITATION     PROGRESS NOTE    Subjective/Complaints: Overall feeling a bit better.  Sleep is reasonable.  Seems to be tolerating seizure medication a bit better.  Questions why he is getting labs done so frequently.  Right knee remains problematic  ROS: pt denies nausea, vomiting, diarrhea, cough, shortness of breath or chest pain   Objective: Vital Signs: Blood pressure 124/74, pulse 84, temperature 98 F (36.7 C), temperature source Oral, resp. rate 16, height 6' (1.829 m), weight 79.3 kg (174 lb 13.2 oz), SpO2 93 %. No results found. Recent Labs    12/13/17 0729  WBC 9.8  HGB 13.7  HCT 40.4  PLT 272   Recent Labs    12/13/17 0729  NA 134*  K 4.1  CL 98*  GLUCOSE 100*  BUN 20  CREATININE 0.62  CALCIUM 8.9   CBG (last 3)  No results for input(s): GLUCAP in the last 72 hours.  Wt Readings from Last 3 Encounters:  12/10/17 79.3 kg (174 lb 13.2 oz)  12/07/17 81.4 kg (179 lb 7.3 oz)  12/03/17 79.8 kg (176 lb)    Physical Exam:  Constitutional: He isoriented to person, place, and time.Mood is a bit flat but appropriate HENT:  Head:Normocephalic.  Eyes:EOMI Neck:Normal range of motion.Neck supple.No thyromegalypresent.  Cardiovascular:RRR without murmur. No JVD     Respiratory: CTA Bilaterally without wheezes or rales. Normal effort    WU:JWJXGI:Soft.Bowel sounds are normal. He exhibitsno distension Skin.  Macular rash less intense around back and buttock areas. Musc: Left shoulder tender to abduction as well as basic rotation.  He has some pain over the left AC joint.  Right knee is extended at rest today but is quite tender with passive range of motion.  Still generalized effusion around the knee Neurological: He is  oriented to person, place, and time.   Speech mildly dysarthric.  Fair insight and awareness.  Remains more alert today motor exam essentially to minus 2 out of 5 right deltoid biceps triceps. Wrists  essentially 2+ to 3- out of 5. Hand is similar. Right lower extremity grossly 2+ to 3 out of 5 proximal distal with difficulty and motor sequencing. Left upper extremity grossly 3 out of 5 to 3+ out of 5 proximal to distal. Left lower extremity grossly 3+ out of 5 proximal to distal. Continue slow initiation with left arm.  Appears to be more aware of it today. Psych: Flat but pleasant      Assessment/Plan: 1.  Functional deficits secondary to intracranial hemorrhage which require 3+ hours per day of interdisciplinary therapy in a comprehensive inpatient rehab setting. Physiatrist is providing close team supervision and 24 hour management of active medical problems listed below. Physiatrist and rehab team continue to assess barriers to discharge/monitor patient progress toward functional and medical goals.  Function:  Bathing Bathing position      Bathing parts   Body parts bathed by helper: Right arm, Left arm, Front perineal area, Buttocks, Right upper leg, Left upper leg, Right lower leg, Left lower leg, Back, Chest, Abdomen  Bathing assist Assist Level: 2 helpers(+2 A to wash buttocks while OT rolls pt )      Upper Body Dressing/Undressing Upper body dressing   What is the patient wearing?: Pull over shirt/dress     Pull over shirt/dress - Perfomed by patient: Thread/unthread left sleeve Pull over shirt/dress - Perfomed by helper: Thread/unthread right sleeve, Put head through opening, Pull shirt over trunk  Upper body assist Assist Level: Touching or steadying assistance(Pt > 75%)      Lower Body Dressing/Undressing Lower body dressing   What is the patient wearing?: Pants, Socks, Shoes       Pants- Performed by helper: Thread/unthread right pants leg, Thread/unthread left pants leg, Pull pants up/down   Non-skid slipper socks- Performed by helper: Don/doff right sock, Don/doff left sock   Socks - Performed by helper: Don/doff right sock, Don/doff left  sock   Shoes - Performed by helper: Don/doff right shoe, Don/doff left shoe, Fasten left, Fasten right          Lower body assist Assist for lower body dressing: 2 Designer, multimedia activity did not occur: No continent bowel/bladder event   Toileting steps completed by helper: Adjust clothing prior to toileting, Performs perineal hygiene, Adjust clothing after toileting(per Epimenio Foot, NT report)    Toileting assist Assist level: Two helpers(per Epimenio Foot, NT report)   Transfers Chair/bed transfer Chair/bed transfer activity did not occur: Safety/medical concerns Chair/bed transfer method: Lateral scoot Chair/bed transfer assist level: Maximal assist (Pt 25 - 49%/lift and lower) Chair/bed transfer assistive device: Sliding board, Armrests     Locomotion Ambulation Ambulation activity did not occur: Safety/medical concerns         Wheelchair Wheelchair activity did not occur: Safety/medical concerns Type: Manual      Cognition Comprehension Comprehension assist level: Understands complex 90% of the time/cues 10% of the time  Expression Expression assist level: Expresses complex ideas: With no assist  Social Interaction Social Interaction assist level: Interacts appropriately with others - No medications needed.  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 DVT Prophylaxis/Anticoagulation: SCDs. Monitor for any signs of DVT 3. Pain  Management:Baclofen 20 mg 3 times a day left shoulder tender with palpation and basic abduction and rotation.   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     -I will inject his right knee today 4. Mood:Provide emotional support 5. Neuropsych: This patientiscapable of making decisions on hisown behalf. 6. Skin/Wound Care:Rash resolving     -Continue hydrocortisone cream Benadryl and hypoallergenic sheets   -Diflucan 100 mg daily with nystatin powder as well     7. Fluids/Electrolytes/Nutrition:Continue to encourage p.o. 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 EEG 11/10/2017 negative. Initially on Keppra changed to Depakote 500 mg twice a day 01/15/2019as well as Vimpat 100 mg twice a day   -Explained to patient and wife that he likely will be somewhat drowsy from the increases in addition of these medications.      -Arousal seems to be improving so we will observe for now  9.Hypertension. No current antihypertensive medication. Monitor with increased mobility 10.COPD with history of tobacco abuse in the past. Continue Dulera 11.  Low-grade temperature, likely related to #6.  Urinalysis  unremarkable thus far.  Urine culture pending  LOS (Days) 4 A FACE TO FACE EVALUATION WAS PERFORMED  Ranelle Oyster, MD 12/14/2017 8:58 AM

## 2017-12-15 ENCOUNTER — Inpatient Hospital Stay (HOSPITAL_COMMUNITY): Payer: Medicare HMO | Admitting: Physical Therapy

## 2017-12-15 ENCOUNTER — Encounter (HOSPITAL_COMMUNITY): Payer: Medicare HMO | Admitting: Psychology

## 2017-12-15 ENCOUNTER — Inpatient Hospital Stay (HOSPITAL_COMMUNITY): Payer: Medicare HMO | Admitting: Occupational Therapy

## 2017-12-15 ENCOUNTER — Encounter: Payer: Medicare HMO | Admitting: Occupational Therapy

## 2017-12-15 ENCOUNTER — Inpatient Hospital Stay (HOSPITAL_COMMUNITY): Payer: Medicare HMO | Admitting: Speech Pathology

## 2017-12-15 ENCOUNTER — Ambulatory Visit: Payer: Medicare HMO | Admitting: Physical Therapy

## 2017-12-15 MED ORDER — SENNOSIDES-DOCUSATE SODIUM 8.6-50 MG PO TABS
1.0000 | ORAL_TABLET | Freq: Every day | ORAL | Status: DC | PRN
  Filled 2017-12-15: qty 1

## 2017-12-15 NOTE — Patient Care Conference (Addendum)
Inpatient RehabilitationTeam Conference and Plan of Care Update Date: 12/14/2017   Time: 2:15 PM    Patient Name: Jaime Pierce      Medical Record Number: [id number]  Date of Birth: 12-Jul-1946 Sex: Male         Room/Bed: 4W10C/4W10C-01 Payor Info: Payor: [employer] MEDICARE / Plan: [employer] MEDICARE HMO/PPO / Product Type: *No Product type* /    Admitting Diagnosis: Stroke  Admit Date/Time:  12/10/2017  3:03 PM Admission Comments: No comment available   Primary Diagnosis:  Cerebral amyloid angiopathy (CODE) Principal Problem: Cerebral amyloid angiopathy (CODE)  Patient Active Problem List   Diagnosis Date Noted  . Intracerebral hemorrhage 12/10/2017  . Cerebral amyloid angiopathy (CODE) 12/09/2017  . Seizures (HCC) 12/09/2017  . ICH (intracerebral hemorrhage) (HCC) 12/06/2017  . Partial seizure (HCC) 11/08/2017  . Positive colorectal cancer screening using Cologuard test 10/22/2017  . Hemiparesis affecting right side as late effect of cerebrovascular accident (CVA) (HCC)   . Cough   . 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 Pain   . Hyponatremia   . Benign essential HTN   . 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 Gait disturbance, post-stroke   . Aphasia as late effect of stroke   . SAH (subarachnoid hemorrhage) (HCC) 09/02/2017  . B12 deficiency 09/02/2017  . Well adult exam 06/16/2016  . Cigarette smoker 02/23/2016  . COPD GOLD II  12/24/2014  . CAD (coronary artery disease) 07/22/2011  . HTN (hypertension) 07/22/2011  . Dyslipidemia 07/22/2011  . Smoker 07/22/2011    Expected Discharge Date: Expected Discharge Date: 01/08/18  Team Members Present: Physician leading conference: Dr. Faith RogueZachary Swartz Social Worker Present: Amada JupiterLucy Taijuan Serviss, LCSW Nurse Present: Keturah BarreEd Knisley, RN PT Present: Alyson ReedyElizabeth Tygielski, PT OT Present: Ardis Rowanom Lanier, COTA;Jennifer Katrinka BlazingSmith, OT PPS Coordinator present : Tora DuckMarie Noel,  RN, CRRN     Current Status/Progress Goal Weekly Team Focus  Medical   recurrent ICH, CAA. rash, low grade temp, significant weakness and visual-spatial awareness  improve balance, initiation  rash rx, fever rx,    Bowel/Bladder   continent of bowel and bladder with min assist  remain continent of bowel and bladder min assist  educate patient about signs/symptoms of constipation and bladder infections   Swallow/Nutrition/ Hydration   Supervision, regular/thin         ADL's   bathing/dressing-max A/tot A; absent sensation LUE; sitting balance-min A; slide board transfers-mod A  bathing-min A; UB dressing-min A; LB dressing-mod A; tub transfers-mod A; toilet transfers-min A; toiletintg-mod A  activity tolerance, sitting balance, functional transfers; functional use of LUE, BADL retraining   Mobility   maxA bed mobility, mod/maxA slideboard transfer, mod/maxA +2 sit <>stand, gait NT  modA bed mobility, transfers, gait and stairs, minA sitting balance and w/c propulsion  Sitting/standing balance, bed mobility, transfer training   Communication             Safety/Cognition/ Behavioral Observations  Min-Mod A  MIn A -upgrade to supervision?  problem solving,working memory, selective attention, left inattention   Pain   patient reports pain in his right knee and left shoulder, controlled with voltarin and pain meds  pain less than or equal to 4/10  assess pain q4h and prn   Skin   rash to lower back  no new skin injury/breakdown  assess skin q shift and prn    Rehab Goals Patient on target to meet rehab goals: Yes *See Care  Plan and progress notes for long and short-term goals.     Barriers to Discharge  Current Status/Progress Possible Resolutions Date Resolved   Physician    Medical stability;Wound Care        continue daily medical mgt as above      Nursing                  PT                    OT                  SLP                SW                Discharge  Planning/Teaching Needs:  Plan for pt to return home with wife.  Appears goals are set for more assistance than pt was requiring at d/c last time from CIR.  Will need to confirm with wife that she can manage this.  Teaching to be planned closer to d/c.   Team Discussion:  Returning patient familiar to this team.  Cont b/b;  More alert past couple of days.  Mod - max bed mobility.  Mod assist with amb once up.  Apraxic and decreased sensation in right hand.  Goals overall for min/mod assist.  Plan to upgrade some ST goals with increased alertness.  Revisions to Treatment Plan:  None    Continued Need for Acute Rehabilitation Level of Care: The patient requires daily medical management by a physician with specialized training in physical medicine and rehabilitation for the following conditions: Daily direction of a multidisciplinary physical rehabilitation program to ensure safe treatment while eliciting the highest outcome that is of practical value to the patient.: Yes Daily medical management of patient stability for increased activity during participation in an intensive rehabilitation regime.: Yes Daily analysis of laboratory values and/or radiology reports with any subsequent need for medication adjustment of medical intervention for : Neurological problems  Jaime Pierce 12/16/2017, 11:02 AM

## 2017-12-15 NOTE — Progress Notes (Signed)
Occupational Therapy Session Note  Patient Details  Name: Jaime FuelRichard A Sedivy MRN: [id number] Date of Birth: 06-02-1946  Today's Date: 12/15/2017 OT Individual Time: 0900-1000 OT Individual Time Calculation (min): 60 min    Short Term Goals: Week 1:  OT Short Term Goal 1 (Week 1): Pt will sit to stand in stedy with MAX A of 1 in prep for functional transfers OT Short Term Goal 2 (Week 1): Pt will maintain dynamic sitting balance with no more than CGA in prep for LB dressing OT Short Term Goal 3 (Week 1): Pt will recall hemi techniques with min VC OT Short Term Goal 4 (Week 1): Pt will groom at sink with MOD A  Skilled Therapeutic Interventions/Progress Updates:    Pt presents supine in bed, ready for OT tx session with no c/o pain. Pt completes bed mobility supine>sitting EOB with MinA to initially bring RLE towards EOB and to support trunk into upright position. Pt completes slide board transfer to the L with ModA (+2 for safety). Transported Pt via w/c to therapy gym, Pt completing slide board transfer with increased time/effort and MaxA (+2 for safety) to the L with cues for head/hips relationship and anterior wt shift. Once seated edge of mat further education provided on body positioning for completion of slide board transfers with Pt return demonstrating to scoot to L and R on mat table. Pt engaged in dynamic sitting activity reaching to L and R to obtain and place/toss items with multimodal cues while leaning to the R to promote anterior wt shift as well as to facilitate wt bearing through RUE while Pt utilizes LUE to manipulate items. Pt reaching and tossing items from above and below BOS. Pt returned to w/c end of session utilizing slide board transfer to the R, required multimodal cues for proper body positioning however demonstrating increased carryover of education provided. Completing transfer with ModA. Pt returned to room end of session where Pt was left seated in TIS w/c, spouse  present, call bell and needs within reach.   Therapy Documentation Precautions:  Precautions Precautions: Fall Precaution Comments: impaired procioceptive awareness bil.  Restrictions Weight Bearing Restrictions: No General:   Vital Signs:   Pain:   ADL:   Vision   Perception    Praxis   Exercises:   Other Treatments:    See Function Navigator for Current Functional Status.   Therapy/Group: Individual Therapy  Orlando PennerBreanna L Buryl Bamber 12/15/2017, 11:53 AM

## 2017-12-15 NOTE — Progress Notes (Addendum)
Physical Therapy Session Note  Patient Details  Name: Delora FuelRichard A Kurt MRN: [id number] Date of Birth: January 19, 1946  Today's Date: 12/15/2017 PT Individual Time: 1130-1200 and 1300-1415 PT Individual Time Calculation (min): 30 min and 75 min (total 115 min)   Short Term Goals: Week 1:  PT Short Term Goal 1 (Week 1): Pt will perform bed mobility modA supine <>sit PT Short Term Goal 2 (Week 1): Pt will perform lateral scoot transfers consistent modA PT Short Term Goal 3 (Week 1): Pt will perform sit <>stand maxA +1 PT Short Term Goal 4 (Week 1): Pt will demonstrate OOB tolerance >4hours/day outside of therapy session  Skilled Therapeutic Interventions/Progress Updates: Tx 1: Pt received seated in w/c, denies pain and agreeable to treatment. Transfer with transfer board to L side onto mat table minA, tactile cues for anterior weight shift and hand placement. Sit <>stand x3 from elevated mat table mod/maxA, bolster between feet/knees to facilitate pelvic tilt and reduce adductor tone. Sit <>stand and pregait with weight shifting R/L progressed to  alternating forward/backward steps to targets BLE; minA for balance, mirror visual feedback and cueing. Requires most assist for stand>sit d/t R lateral lean and pushing with LUE. Stand pivot transfer modA to L side to return to w/c; mod verbal/tactile cues for foot progression to step instead of scooting feet, and again heavier assist for stand>sit for neutral alignment. Returned to room totalA by wife at end of session.   Tx 2: Pt received seated in w/c, denies pain and agreeable to treatment. Stand pivot transfer with RW to L side to mat table with modA; increased cues needed for foot placement and technique, tall upright posture to reduced R lateral lean. Sit <>stand x3 reps with RW and modA, mirror visual feedback for postural alignment. Standing LLE taps to 4" step 2x10 reps for focus on RLE stance control, upright posture and weight shifting. Gait with RW  x10' with min/modA for R foot placement to reduce scissoring and mod cues for technique d/t poor stance control on RLE. Attempted second gait trial starting from w/c, however d/t LUE pushing from w/c arm rest, and fatigue, pt with decline in midline alignment, increase in pusher symptoms with L extremities, heavy reliance on RW. Returned to mat with modA lateral scoot with transfer board. Sitting and standing L lateral reaching with LUE to retrieve horseshoes; poor weight shift to L side and decreasing RLE stance control. Sit >supine maxA. Hookyling trunk rotation x10 reps each side. Pt fatiguing quickly and falling asleep on mat. Returned to sitting modA with log roll. Lateral scoot to R side with transfer board to return to w/c. Returned to room totalA by wife; remained seated in w/c, RN present and all needs in reach.      Therapy Documentation Precautions:  Precautions Precautions: Fall Precaution Comments: impaired procioceptive awareness bil.  Restrictions Weight Bearing Restrictions: No   See Function Navigator for Current Functional Status.   Therapy/Group: Individual Therapy  [name] 12/15/2017, 12:09 PM

## 2017-12-15 NOTE — Progress Notes (Signed)
Social Work  Social Work Assessment and Plan  Patient Details  Name: Jaime FuelRichard A Fiorillo MRN: [id number] Date of Birth: 1945/12/24  Today's Date: 12/13/2017  Problem List:  Patient Active Problem List   Diagnosis Date Noted  . Intracerebral hemorrhage 12/10/2017  . Cerebral amyloid angiopathy (CODE) 12/09/2017  . Seizures (HCC) 12/09/2017  . ICH (intracerebral hemorrhage) (HCC) 12/06/2017  . Partial seizure (HCC) 11/08/2017  . Positive colorectal cancer screening using Cologuard test 10/22/2017  . Hemiparesis affecting right side as late effect of cerebrovascular accident (CVA) (HCC)   . Cough   . 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 Pain   . Hyponatremia   . Benign essential HTN   . 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 Gait disturbance, post-stroke   . Aphasia as late effect of stroke   . SAH (subarachnoid hemorrhage) (HCC) 09/02/2017  . B12 deficiency 09/02/2017  . Well adult exam 06/16/2016  . Cigarette smoker 02/23/2016  . COPD GOLD II  12/24/2014  . CAD (coronary artery disease) 07/22/2011  . HTN (hypertension) 07/22/2011  . Dyslipidemia 07/22/2011  . Smoker 07/22/2011   Past Medical History:  Past Medical History:  Diagnosis Date  . Coronary artery disease   . Dyspnea on exertion   . Hemorrhagic stroke (HCC)   . Hyperlipidemia   . Hypertension   . Seizures (HCC)   . Stroke (HCC)   . Tobacco abuse    Past Surgical History:  Past Surgical History:  Procedure Laterality Date  . CORONARY ARTERY BYPASS GRAFT     Social History:  reports that he quit smoking about 12 months ago. His smoking use included cigarettes. he has never used smokeless tobacco. He reports that he drinks alcohol. He reports that he does not use drugs.  Family / Support Systems Marital Status: Married Patient Roles: Spouse, Parent Spouse/Significant Other: wife, Bary LericheDonna Dhami @ (C)  [phone] Children: daughter, Print production plannerAmber Register ([place] and daughter, Morrie [place]ld[place]hley ([place] Anticipated Caregiver: Spouse  Ability/Limitations of Caregiver: None Caregiver Availability: 24/7 Family Dynamics: Wife extremely supportive and stays daily with pt.  Daughters also very supportive.  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  He admits being "disappointed" in having another hemorrhage as he feels he was making good progress at OP tx.  Affect appears somewhat depressed.  Will re-consult neuropsychology as he was followed during prior CIR admit. Recent Psychosocial Issues: First hemorrhage Oct 2018 Pyschiatric History: none Substance Abuse History: none  Patient / Family Perceptions, Expectations & Goals Pt/Family understanding of illness & functional limitations: Pt and family with very good understanding of his medical issues and relation to risk for further hemorrhages.  Good understanding of new functional limitations and need for further rehab. Premorbid pt/family roles/activities: Wife provides minimal help as needed with ADLs.  Pt was min-guard with RW Anticipated changes in roles/activities/participation: It is anticipated that pt will require greater physical assistance at time of d/c than he required after prior CIR.  Pt/family expectations/goals: "I just hope I can  get back some of what I lost."  [employer]: None Premorbid Home Care/DME Agencies: Other (Comment)([facility]) Transportation available at discharge: yes Resource referrals recommended: Neuropsychology  Discharge Planning Living Arrangements: Spouse/significant other Support Systems: Spouse/significant other, Children, Friends/neighbors, Church/faith community Type of Residence: Private residence Insurance Resources: Medicare(Aetna Medicare) Financial Resources: Social Security Financial Screen Referred: No Living Expenses: Own Money Management: Spouse, Patient Does the patient have any problems obtaining your medications?: No Home Management: mostly wife since pt's prior stroke Patient/Family Preliminary Plans: Pt and wife plan for pt to return home with wife resuming caregiver support  Social Work Anticipated Follow Up Needs: HH/OP Expected length of stay: 4 weeks  Clinical Impression Very pleasant gentleman who returns to CIR now following another hemorrhage.  Familiar to this SW and treatment team and motivated for therapies.  He does admit much disappointment of having another hemorrhage as he feels he was making good progress at outpatient rehab.  Wife and family continue to be very involved and supportive with plan for pt to return home again after CIR.  Will follow for support and d/c planning needs.  Aolanis Crispen 12/13/2017, 2:18 PM

## 2017-12-15 NOTE — Progress Notes (Signed)
[place] PHYSICAL MEDICINE & REHABILITATION     PROGRESS NOTE    Subjective/Complaints: Feeling fairly well.  Right knee seems to be less tender.  Does not know for sure as he has not gotten up out of bed yet how well it feels.  ROS: pt denies nausea, vomiting, diarrhea, cough, shortness of breath or chest pain   Objective: Vital Signs: Blood pressure 129/64, pulse 72, temperature 97.8 F (36.6 C), temperature source Oral, resp. rate 16, height 6' (1.829 m), weight 79 kg (174 lb 2.6 oz), SpO2 92 %. No results found. Recent Labs    12/13/17 0729  WBC 9.8  HGB 13.7  HCT 40.4  PLT 272   Recent Labs    12/13/17 0729  NA 134*  K 4.1  CL 98*  GLUCOSE 100*  BUN 20  CREATININE 0.62  CALCIUM 8.9   CBG (last 3)  No results for input(s): GLUCAP in the last 72 hours.  Wt Readings from Last 3 Encounters:  12/15/17 79 kg (174 lb 2.6 oz)  12/07/17 81.4 kg (179 lb 7.3 oz)  12/03/17 79.8 kg (176 lb)    Physical Exam:  Constitutional: He isoriented to person, place, and time.Mood is a bit flat but appropriate HENT:  Head:Normocephalic.  Eyes:EOMI Neck:Normal range of motion.Neck supple.No thyromegalyprese during movement Nt.  Cardiovascular:RRR without murmur. No JVD      Respiratory: CTA Bilaterally without wheezes or rales. Normal effort   XB:JYNW.Bowel sounds are normal. He exhibitsno distension Skin.  Macular rash is maturing and darker in color.  Area less tender Musc: Left shoulder tender to abduction as well as basic rotation.  He has some pain over the left AC joint.  Right knee still slightly tender.  He is able to extend the knee while seated to almost full extension.  He felt more "stiff" and tender Neurological: He is  oriented to person, place, and time.   Very alert.  Improved insight and awareness.  Motor exam essentially to minus 2 out of 5 right deltoid biceps triceps. Wrists essentially 2+ to 3- out of 5. Hand is similar. Right lower extremity  grossly 3 to 3+ out of 5 proximal distal with difficulty and motor sequencing. Left upper extremity grossly 3+ to 4- out of 5 proximal to distal. Left lower extremity grossly  4 out of 5 proximal to distal. Better initiation of right side. Psych: Flat but pleasant      Assessment/Plan: 1.  Functional deficits secondary to intracranial hemorrhage which require 3+ hours per day of interdisciplinary therapy in a comprehensive inpatient rehab setting. Physiatrist is providing close team supervision and 24 hour management of active medical problems listed below. Physiatrist and rehab team continue to assess barriers to discharge/monitor patient progress toward functional and medical goals.  Function:  Bathing Bathing position      Bathing parts   Body parts bathed by helper: Right arm, Left arm, Front perineal area, Buttocks, Right upper leg, Left upper leg, Right lower leg, Left lower leg, Back, Chest, Abdomen  Bathing assist Assist Level: 2 helpers(+2 A to wash buttocks while OT rolls pt )      Upper Body Dressing/Undressing Upper body dressing   What is the patient wearing?: Pull over shirt/dress     Pull over shirt/dress - Perfomed by patient: Thread/unthread left sleeve Pull over shirt/dress - Perfomed by helper: Thread/unthread right sleeve, Put head through opening, Pull shirt over trunk        Upper body assist Assist Level:  Touching or steadying assistance(Pt > 75%)      Lower Body Dressing/Undressing Lower body dressing   What is the patient wearing?: Pants, Socks, Shoes       Pants- Performed by helper: Thread/unthread right pants leg, Thread/unthread left pants leg, Pull pants up/down   Non-skid slipper socks- Performed by helper: Don/doff right sock, Don/doff left sock   Socks - Performed by helper: Don/doff right sock, Don/doff left sock   Shoes - Performed by helper: Don/doff right shoe, Don/doff left shoe, Fasten left, Fasten right          Lower body  assist Assist for lower body dressing: 2 Designer, multimedia activity did not occur: No continent bowel/bladder event   Toileting steps completed by helper: Adjust clothing prior to toileting, Performs perineal hygiene, Adjust clothing after toileting    Toileting assist Assist level: Two helpers   Transfers Chair/bed transfer Chair/bed transfer activity did not occur: Safety/medical concerns Chair/bed transfer method: Lateral scoot Chair/bed transfer assist level: Moderate assist (Pt 50 - 74%/lift or lower) Chair/bed transfer assistive device: Sliding board, Armrests     Locomotion Ambulation Ambulation activity did not occur: Safety/medical concerns   Max distance: 15 Assist level: 2 helpers   Education administrator activity did not occur: Safety/medical concerns Type: Manual   Assist Level: Dependent (Pt equals 0%)(TIS)  Cognition Comprehension Comprehension assist level: Understands complex 90% of the time/cues 10% of the time  Expression Expression assist level: Expresses complex 90% of the time/cues < 10% of the time  Social Interaction Social Interaction assist level: Interacts appropriately 90% of the time - Needs monitoring or encouragement for participation or interaction.  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 DVT Prophylaxis/Anticoagulation: SCDs. Monitor for any signs of DVT 3. Pain Management:Baclofen 20 mg 3 times a day left shoulder tender with  palpation and basic abduction and rotation.   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     -Injected right knee yesterday.  Patient already experiencing positive effects 4. Mood:Provide emotional support 5. Neuropsych: This patientiscapable of making decisions on hisown behalf. 6. Skin/Wound Care:    -Continue hydrocortisone cream Benadryl and hypoallergenic sheets   -Diflucan 100 mg daily with nystatin powder as well--rash improving.  Can stop     7. Fluids/Electrolytes/Nutrition:Continue to encourage p.o. 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 EEG 11/10/2017 negative. Initially on Keppra changed to Depakote 500 mg twice a day 01/15/2019as well as Vimpat 100 mg twice a day   -Explained to patient and wife that he likely will be somewhat drowsy from the increases in addition of these medications.      -Seem to be much more alert overall 9.Hypertension. No current antihypertensive medication. Monitor with increased mobility 10.COPD with history of tobacco abuse in the past. Continue Dulera 11.  Low-grade temperature, resolved  LOS (Days) 5  A FACE TO FACE EVALUATION WAS PERFORMED  Ranelle OysterSWARTZ,ZACHARY T, MD 12/15/2017 9:03 AM

## 2017-12-15 NOTE — Consult Note (Signed)
Neuropsychological Consultation   Patient:   Jaime Pierce   DOB:   May 23, 1946  MR Number:  [id number]  [facility] [address] [id number] [place] [id number] Dept: [phone] Loc: [phone]           Date of Service:   12/15/2017  Start Time:   10 AM End Time:   11 AM  Provider/Observer:  Arley PhenixJohn Keyarra Rendall, Psy.D.       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  I saw patient during his previous CIR treatment in 2018.  Patient with new right hemisphere hemorrhage.  Evolved over time and after altered mental status noted regional mass effect without midline shift.  Patient with new symptoms of left side weakness but good mental status now.  Coping challenges due to second intracerebral hemorrhage and continuing to cope with prior hemiparesis of right side.  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 He had recently graduated to a quad cane with outpatient therapies. Wife would assist with some bathing and simple ADLs. Presented 12/06/2017 with left upper extremity tingling and weakness.Systolic blood pressure  110-120.Cranial CT scan showed newly seen intraparenchymal hemorrhage in the right inferior temporal lobe and right parietal lobe with some subarachnoid blood right parietal .Neurology follow-upsuspect cerebral amyloid angiopathy withhislatestcranial CT scan showed evolving acute right temporal and right parietal lobe intraparenchymal hematomas. No midline shift.rapid response contacted 12/09/2017 question altered mental status and again scan was repeated showing regional mass effect without midline shift. Small volume residual right parietal subarachnoid hemorrhage and again constellation findings compatible with amyloid angiopathy.EEG follow-up with no seizure activity. Patient remains on Depakote for history of seizure disorderas well as the addition of Vimpat. Tolerating a mechanical soft diet.Patient developed left shoulder pain overnight after going down for a CT scan. Shoulder x-ray reveals mild acromioclavicular arthritis and some degenerative changes likely associated with rotator cuff tendinopathy. Physicaland occupationaltherapy evaluationscompleted 12/07/2017 with recommendations of physical medicine rehabilitation consult.Patient was admitted for a comprehensive rehabilitation program  Current Status:  Patient is reported by wife to be frustrated due to having second event after his recent progress thru outpatient therapies with him progressing to 4 leg walker.  He reports that he fears those gains are lost and he will not be able to continue with walker.    Behavioral Observation: Jaime Pierce  presents as a 72 y.o.-year-old Right Caucasian Male who appeared his stated age. his dress was Appropriate and he was Well Groomed and his manners were Appropriate to the situation.  his participation was indicative of Appropriate and Attentive behaviors.  There were any physical disabilities noted.  he displayed an appropriate level of cooperation and motivation.     Interactions:     Active Appropriate and Attentive  Attention:   abnormal  and attention span appeared shorter than expected for age  Memory:   within normal limits; recent and remote memory intact  Visuo-spatial:  not examined  Speech (Volume):  low  Speech:   normal; normal  Thought Process:  Coherent and Relevant  Though Content:  WNL; not suicidal  Orientation:   person, place, time/date and situation  Judgment:   Good  Planning:   Good  Affect:    Depressed  Mood:    Dysphoric  Insight:   Good  Intelligence:   normal  Medical History:   Past Medical History:  Diagnosis Date  . Coronary artery disease   . Dyspnea on exertion   . Hemorrhagic stroke (HCC)   . Hyperlipidemia   . Hypertension   . Seizures (HCC)   . Stroke (HCC)   . Tobacco abuse        Family Med/Psych History:  Family History  Problem Relation Age of Onset  . Heart disease Father   . CVA Mother   . 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  I saw patient during his previous CIR treatment in 2018.  Patient with new right hemisphere hemorrhage.  Evolved over time and after altered mental status noted regional mass effect without midline shift.  Patient with new symptoms of left side weakness but good mental status now.  Coping challenges due to second intracerebral hemorrhage and continuing to cope with prior hemiparesis of right side.  Patient is reported by wife to be frustrated due to having second event after his recent progress thru outpatient therapies with him progressing to 4 leg walker.  He reports that he fears those gains are  lost and he will not be able to continue with walker.   Worked on coping and adjusting with both the patient and his wife was present.           Electronically Signed   _______________________ Arley Phenix, Psy.D.

## 2017-12-15 NOTE — Progress Notes (Signed)
Social Work Patient ID: Jaime Pierce, male   DOB: Aug 06, 1946, 72 y.o.   MRN: [id number]   Have reviewed team conference with pt and wife who are both aware and agreeable with targeted d/c date of 01/08/18 and min/ mod assist goals overall.  Stressed that his physical assistance needs are anticipated to be greater than prior CIR d/c.  Wife understands and continues to attend and take part in therapies.  Continue to follow.  Maliah Pyles, LCSW

## 2017-12-15 NOTE — Progress Notes (Signed)
Speech Language Pathology Daily Session Note  Patient Details  Name: Delora FuelRichard A Calixto MRN: [id number] Date of Birth: 05-20-1946  Today's Date: 12/15/2017 SLP Individual Time: 1430-1500 SLP Individual Time Calculation (min): 30 min  Short Term Goals: Week 1: SLP Short Term Goal 1 (Week 1): Patient will demonstrate selective attention to a task in a moderately distracting enviornment for ~45 Minutes with Min A verbal cues for redirection.  SLP Short Term Goal 2 (Week 1): Patient will demonstrate functional problem solving for basic and familiar tasks with Mod A verbal cues.  SLP Short Term Goal 3 (Week 1): Patient will recall new and functional information with Mod A verbal cues.  SLP Short Term Goal 4 (Week 1): Patient will self-monitor and correct errors during tasks with Mod A verbal cues.   Skilled Therapeutic Interventions: Skilled treatment session focused on cognitive goals. SLP facilitated session by providing Mod A verbal cues for problem solving during a mildly complex categorizing task. Pt required Mod A verbal cues for inattention to left upper extremity. Patient demonstrated selective attention to task in a moderately distractive environment with Min A verbal cues for ~30 minutes. Patient left in wheelchair with wife for transport back to room. Continue with current plan of care.    Function:  Cognition Comprehension Comprehension assist level: Understands basic 75 - 89% of the time/ requires cueing 10 - 24% of the time  Expression   Expression assist level: Expresses basic 75 - 89% of the time/requires cueing 10 - 24% of the time. Needs helper to occlude trach/needs to repeat words.  Social Interaction Social Interaction assist level: Interacts appropriately 75 - 89% of the time - Needs redirection for appropriate language or to initiate interaction.  Problem Solving Problem solving assist level: Solves basic 50 - 74% of the time/requires cueing 25 - 49% of the time  Memory  Memory assist level: Recognizes or recalls 50 - 74% of the time/requires cueing 25 - 49% of the time    Pain Pain Assessment Pain Assessment: No/denies pain  Therapy/Group: Individual Therapy  Roxan HockeyYahui Quintel Mccalla  SLP - Student 12/15/2017, 3:53 PM

## 2017-12-15 NOTE — Progress Notes (Signed)
PROCEDURE NOTE  DIAGNOSIS: OA right knee  INTERVENTION:  Major joint  Date of injection 12/14/17, time 1355   After informed consent and preparation of the skin with betadine and isopropyl alcohol, I injected 40mg  kenalog nd 4cc of 1% lidocaine into the right knee via antero-lateral approach. Additionally, aspiration was performed prior to injection. The patient tolerated well, and no complications were encountered. Afterward the area was cleaned and dressed. Post- injection instructions were provided.      Ranelle OysterZachary T. Hailey Miles, MD, [facility]APMR [facility] 12/15/2017

## 2017-12-15 NOTE — Care Management (Signed)
[facility] Individual Statement of Services  Patient Name:  Jaime Pierce  Date:  12/14/2017  Welcome to the [facility].  Our goal is to provide you with an individualized program based on your diagnosis and situation, designed to meet your specific needs.  With this comprehensive rehabilitation program, you will be expected to participate in at least 3 hours of rehabilitation therapies Monday-Friday, with modified therapy programming on the weekends.  Your rehabilitation program will include the following services:  Physical Therapy (PT), Occupational Therapy (OT), Speech Therapy (ST), 24 hour per day rehabilitation nursing, Therapeutic Recreaction (TR), Neuropsychology, Case Management (Social Worker), Rehabilitation Medicine, Nutrition Services and Pharmacy Services  Weekly team conferences will be held on Tuesdays to discuss your progress.  Your Social Worker will talk with you frequently to get your input and to update you on team discussions.  Team conferences with you and your family in attendance may also be held.  Expected length of stay: 4 weeks    Overall anticipated outcome: moderate assistance  Depending on your progress and recovery, your program may change. Your Social Worker will coordinate services and will keep you informed of any changes. Your Social Worker's name and contact numbers are listed  below.  The following services may also be recommended but are not provided by the [facility]:   Driving Evaluations  Home Health Rehabiltiation Services  Outpatient Rehabilitation Services    Arrangements will be made to provide these services after discharge if needed.  Arrangements include referral to agencies that provide these services.  Your insurance has been verified to be:  [employer] Your primary doctor is:  Plotnikov  Pertinent information will be shared with your doctor and your insurance  company.  Social Worker:  [place]cy [name], [place] [phone] or ([phone]) [phone]   Information discussed with and copy given to patient by: Amada JupiterHOYLE, Devany Aja, 12/14/2017, 9:21 AM

## 2017-12-16 ENCOUNTER — Inpatient Hospital Stay (HOSPITAL_COMMUNITY): Payer: Medicare HMO

## 2017-12-16 ENCOUNTER — Inpatient Hospital Stay (HOSPITAL_COMMUNITY): Payer: Medicare HMO | Admitting: Physical Therapy

## 2017-12-16 ENCOUNTER — Ambulatory Visit: Payer: Medicare HMO | Admitting: Physical Therapy

## 2017-12-16 ENCOUNTER — Inpatient Hospital Stay (HOSPITAL_COMMUNITY): Payer: Medicare HMO | Admitting: Speech Pathology

## 2017-12-16 ENCOUNTER — Encounter: Payer: Medicare HMO | Admitting: Occupational Therapy

## 2017-12-16 NOTE — Progress Notes (Signed)
Speech Language Pathology Daily Session Note  Patient Details  Name: Jaime FuelRichard A Lezama MRN: [id number] Date of Birth: [birth year]  Today's Date: 12/16/2017 SLP Individual Time: 1130-1205 SLP Individual Time Calculation (min): 35 min  Short Term Goals: Week 1: SLP Short Term Goal 1 (Week 1): Patient will demonstrate selective attention to a task in a moderately distracting enviornment for ~45 Minutes with Min A verbal cues for redirection.  SLP Short Term Goal 2 (Week 1): Patient will demonstrate functional problem solving for basic and familiar tasks with Mod A verbal cues.  SLP Short Term Goal 3 (Week 1): Patient will recall new and functional information with Mod A verbal cues.  SLP Short Term Goal 4 (Week 1): Patient will self-monitor and correct errors during tasks with Mod A verbal cues.   Skilled Therapeutic Interventions: Skilled treatment session focused on cognitive goals. SLP facilitated session by providing Mod A verbal cues for problem solving during a mildly complex task. Pt required Min A verbal cues for inattention to left upper extremity. Pt demonstrated selective attention to task in a moderately distractive environment with Min A verbal cues for ~25 minutes. Pt left upright in wheelchair with all needs within reach and wife present. Continue with current plan of care.    Function:  Cognition Comprehension Comprehension assist level: Understands basic 75 - 89% of the time/ requires cueing 10 - 24% of the time  Expression   Expression assist level: Expresses basic 90% of the time/requires cueing < 10% of the time.  Social Interaction Social Interaction assist level: Interacts appropriately 75 - 89% of the time - Needs redirection for appropriate language or to initiate interaction.  Problem Solving Problem solving assist level: Solves basic 50 - 74% of the time/requires cueing 25 - 49% of the time  Memory Memory assist level: Recognizes or recalls 75 - 89% of the  time/requires cueing 10 - 24% of the time    Pain Pain Assessment Pain Assessment: No/denies pain  Therapy/Group: Individual Therapy  Roxan HockeyYahui Infant Doane  SLP - Student 12/16/2017, 2:40 PM

## 2017-12-16 NOTE — Progress Notes (Signed)
Occupational Therapy Session Note  Patient Details  Name: Jaime Pierce MRN: [id number] Date of Birth: 20-Apr-1946  Today's Date: 12/16/2017 OT Individual Time: 1000-1130 OT Individual Time Calculation (min): 90 min    Short Term Goals: Week 1:  OT Short Term Goal 1 (Week 1): Pt will sit to stand in stedy with MAX A of 1 in prep for functional transfers OT Short Term Goal 2 (Week 1): Pt will maintain dynamic sitting balance with no more than CGA in prep for LB dressing OT Short Term Goal 3 (Week 1): Pt will recall hemi techniques with min VC OT Short Term Goal 4 (Week 1): Pt will groom at sink with MOD A  Skilled Therapeutic Interventions/Progress Updates:    OT intervention with focus on BADL retraining including bathing at shower level, dressing with sit<>stand from w/c [facility](Stedy), and increased LUE use for functional tasks including grasp/release of large and small objects.  Pt transferred to shower with Stedy and sat on tub bench at supervision level.  Pt initiated bathing tasks but exhibits difficulty grasping wash cloth and using functionally during bathing tasks.  [name] to bring in bath mitt from home.  Pt was able to successfully was hair with max verbal cues for thoroughness.  Pt attempted to thread pants but required assistance to complete task.  Pt stood in [place] for pulling up pants.  Pt transitioned to LUE open chain tasks retrieving horseshoes and small foam block with emphasis on grasp/release.  Pt also practiced balling up/crumpling sheets of paper with L hand to [facility] and inhand manipulation tasks.  Pt remained in w/c with all needs within reach and wife present.   Therapy Documentation Precautions:  Precautions Precautions: Fall Precaution Comments: impaired procioceptive awareness bil.  Restrictions Weight Bearing Restrictions: No   Pain:  Pt denies pain  See Function Navigator for Current Functional Status.   Therapy/Group: Individual Therapy  Rich BraveLanier,  Reniyah Gootee Chappell 12/16/2017, 1:07 PM

## 2017-12-16 NOTE — Progress Notes (Signed)
[place] PHYSICAL MEDICINE & REHABILITATION     PROGRESS NOTE    Subjective/Complaints: No new complaints this morning.  Slept well again.  Seem to handle weightbearing better on the right leg although it still is generally tender.  ROS: pt denies nausea, vomiting, diarrhea, cough, shortness of breath or chest pain   Objective: Vital Signs: Blood pressure 121/78, pulse 72, temperature 97.8 F (36.6 C), temperature source Oral, resp. rate 16, height 6' (1.829 m), weight 79 kg (174 lb 2.6 oz), SpO2 95 %. No results found. No results for input(s): WBC, HGB, HCT, PLT in the last 72 hours. No results for input(s): NA, K, CL, GLUCOSE, BUN, CREATININE, CALCIUM in the last 72 hours.  Invalid input(s): CO CBG (last 3)  No results for input(s): GLUCAP in the last 72 hours.  Wt Readings from Last 3 Encounters:  12/15/17 79 kg (174 lb 2.6 oz)  12/07/17 81.4 kg (179 lb 7.3 oz)  12/03/17 79.8 kg (176 lb)    Physical Exam:  Constitutional: He isoriented to person, place, and time.Mood is a bit flat but appropriate HENT:  Head:Normocephalic.  Eyes:EOMI Neck:Normal range of motion.Neck supple.No thyromegalyprese during movement Nt.  Cardiovascular:RRR without murmur. No JVD       Respiratory: CTA Bilaterally without wheezes or rales. Normal effort   QM:VHQIGI:Soft.Bowel sounds are normal. He exhibitsno distension Skin.  Macular rash is maturing and darker in color--improved  Musc: Left shoulder tender to abduction as well as basic rotation.  He has some pain over the left AC joint.  Right knee still slightly tender but more easily ranged to this morning. Neurological: He is  oriented to person, place, and time.   Very alert.  Improved insight and awareness.  Motor exam essentially to minus 2 out of 5 right deltoid biceps triceps. Wrist essentially 2+ to 3- out of 5. Hand is similar--- tone 1 to 1+ out of 4. Right lower extremity grossly 3 to 3+ out of 5 proximal distal with  difficulty and motor sequencing. Left upper extremity grossly 3+ to 4- out of 5 proximal to distal with better initiation of movements and awareness of limbs. Left lower extremity grossly  4 out of 5 proximal to distal.   Psych: Flat but pleasant      Assessment/Plan: 1.  Functional deficits secondary to intracranial hemorrhage which require 3+ hours per day of interdisciplinary therapy in a comprehensive inpatient rehab setting. Physiatrist is providing close team supervision and 24 hour management of active medical problems listed below. Physiatrist and rehab team continue to assess barriers to discharge/monitor patient progress toward functional and medical goals.  Function:  Bathing Bathing position      Bathing parts   Body parts bathed by helper: Right arm, Left arm, Front perineal area, Buttocks, Right upper leg, Left upper leg, Right lower leg, Left lower leg, Back, Chest, Abdomen  Bathing assist Assist Level: 2 helpers(+2 A to wash buttocks while OT rolls pt )      Upper Body Dressing/Undressing Upper body dressing   What is the patient wearing?: Pull over shirt/dress     Pull over shirt/dress - Perfomed by patient: Thread/unthread left sleeve Pull over shirt/dress - Perfomed by helper: Thread/unthread right sleeve, Put head through opening, Pull shirt over trunk        Upper body assist Assist Level: Touching or steadying assistance(Pt > 75%)      Lower Body Dressing/Undressing Lower body dressing   What is the patient wearing?: Pants, Socks, Shoes  Pants- Performed by helper: Thread/unthread right pants leg, Thread/unthread left pants leg, Pull pants up/down   Non-skid slipper socks- Performed by helper: Don/doff right sock, Don/doff left sock   Socks - Performed by helper: Don/doff right sock, Don/doff left sock   Shoes - Performed by helper: Don/doff right shoe, Don/doff left shoe, Fasten left, Fasten right          Lower body assist Assist for  lower body dressing: 2 Designer, multimedia activity did not occur: No continent bowel/bladder event   Toileting steps completed by helper: Adjust clothing prior to toileting, Performs perineal hygiene, Adjust clothing after toileting    Toileting assist Assist level: Two helpers   Transfers Chair/bed transfer Chair/bed transfer activity did not occur: Safety/medical concerns Chair/bed transfer method: Lateral scoot Chair/bed transfer assist level: Touching or steadying assistance (Pt > 75%) Chair/bed transfer assistive device: Armrests, Sliding board     Locomotion Ambulation Ambulation activity did not occur: Safety/medical concerns   Max distance: 10 Assist level: 2 helpers(modA, w/c follow)   Wheelchair Wheelchair activity did not occur: Safety/medical concerns Type: Manual   Assist Level: Dependent (Pt equals 0%)(TIS)  Cognition Comprehension Comprehension assist level: Understands basic 75 - 89% of the time/ requires cueing 10 - 24% of the time  Expression Expression assist level: Expresses basic 75 - 89% of the time/requires cueing 10 - 24% of the time. Needs helper to occlude trach/needs to repeat words.  Social Interaction Social Interaction assist level: Interacts appropriately 75 - 89% of the time - Needs redirection for appropriate language or to initiate interaction.  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  Remains motivated 2. DVT  Prophylaxis/Anticoagulation: SCDs. Monitor for any signs of DVT 3. Pain Management:Baclofen 20 mg 3 times a day left shoulder tender with palpation and basic abduction and rotation.   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     -Injected right knee 1/22.    4. Mood:Provide emotional support 5. Neuropsych: This patientiscapable of making decisions on hisown behalf. 6. Skin/Wound Care:    -Continue hydrocortisone cream Benadryl and hypoallergenic sheets   -Diflucan stopped    -Area gradually improving 7. Fluids/Electrolytes/Nutrition:Continue to encourage p.o. 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 EEG 11/10/2017 negative. Initially on Keppra changed to Depakote 500 mg twice a day 01/15/2019as well as Vimpat 100 mg twice a day   - much more alert overall, seems to have  adjusted to medications  9.Hypertension. No current antihypertensive medication. Monitor with increased mobility 10.COPD with history of tobacco abuse in the past. Continue Dulera 11.  Low-grade temperature, resolved  LOS (Days) 6 A FACE TO FACE EVALUATION WAS PERFORMED  Ranelle Oyster, MD 12/16/2017 8:41 AM

## 2017-12-16 NOTE — Progress Notes (Signed)
Physical Therapy Session Note  Patient Details  Name: Jaime FuelRichard A Kleman MRN: [id number] Date of Birth: Jun 13, 1946  Today's Date: 12/16/2017 PT Individual Time: 1345-1455 PT Individual Time Calculation (min): 70 min   Short Term Goals: Week 1:  PT Short Term Goal 1 (Week 1): Pt will perform bed mobility modA supine <>sit PT Short Term Goal 2 (Week 1): Pt will perform lateral scoot transfers consistent modA PT Short Term Goal 3 (Week 1): Pt will perform sit <>stand maxA +1 PT Short Term Goal 4 (Week 1): Pt will demonstrate OOB tolerance >4hours/day outside of therapy session  Skilled Therapeutic Interventions/Progress Updates: Pt received supine in bed, denies pain and agreeable to treatment. Supine>sit with HOB elevated, S however significantly increased time d/t incoordination and cues required for technique and problem solving to reach for bedrail and break up extensor tone. Squat pivot transfer to L side towards chair; maxA for technique. Transfer to R side onto mat table modA with improved coordination. Scooting R/L with bobath technique and cues for small sit >stand to increase neutral trunk alignment, head/hips relationship and to reduce sheer forces during scooting. Sit <>stand x5 reps from elevated mat with min/modA, mirror visual feedback. Sitting with wedge under R hip, performed dynamic reaching to retrieve items from floor to facilitate anterior weight shift. Gait x20' with RW and min/modA; assist for reducing RLE scissoring, max cues for upright posture, forward visual gaze, increased when mirror visual feedback not available for return trip to mat. Attempted stand pivot to R side however unable to motor plan RLE abduction side step to facilitate turn. Squat pivot modA to R side to return to w/c. Returned to room by daughter total A in w/c; all needs in reach.      Therapy Documentation Precautions:  Precautions Precautions: Fall Precaution Comments: impaired procioceptive awareness  bil.  Restrictions Weight Bearing Restrictions: No Pain: Pain Assessment Pain Assessment: No/denies pain   See Function Navigator for Current Functional Status.   Therapy/Group: Individual Therapy  [name] 12/16/2017, 3:15 PM

## 2017-12-17 ENCOUNTER — Inpatient Hospital Stay (HOSPITAL_COMMUNITY): Payer: Medicare HMO | Admitting: Speech Pathology

## 2017-12-17 ENCOUNTER — Inpatient Hospital Stay (HOSPITAL_COMMUNITY): Payer: Medicare HMO | Admitting: Physical Therapy

## 2017-12-17 ENCOUNTER — Inpatient Hospital Stay (HOSPITAL_COMMUNITY): Payer: Medicare HMO

## 2017-12-17 NOTE — Progress Notes (Signed)
Speech Language Pathology Weekly Progress and Session Note  Patient Details  Name: Jaime Pierce MRN: [id number] Date of Birth: 06/26/1946  Beginning of progress report period: December 10, 2017 End of progress report period: December 17, 2017  Today's Date: 12/17/2017 SLP Individual Time: 1030-1130 SLP Individual Time Calculation (min): 60 min  Short Term Goals: Week 1: SLP Short Term Goal 1 (Week 1): Patient will demonstrate selective attention to a task in a moderately distracting enviornment for ~45 Minutes with Min A verbal cues for redirection.  SLP Short Term Goal 1 - Progress (Week 1): Met SLP Short Term Goal 2 (Week 1): Patient will demonstrate functional problem solving for basic and familiar tasks with Mod A verbal cues.  SLP Short Term Goal 2 - Progress (Week 1): Met SLP Short Term Goal 3 (Week 1): Patient will recall new and functional information with Mod A verbal cues.  SLP Short Term Goal 3 - Progress (Week 1): Met SLP Short Term Goal 4 (Week 1): Patient will self-monitor and correct errors during tasks with Mod A verbal cues.  SLP Short Term Goal 4 - Progress (Week 1): Not met    New Short Term Goals: Week 2: SLP Short Term Goal 1 (Week 2): Patient will demonstrate selective attention to a task in a moderately distracting enviornment for ~45 Minutes with supervision verbal cues for redirection.  SLP Short Term Goal 2 (Week 2): Patient will demonstrate functional problem solving for mildly complex tasks with Min A verbal cues.  SLP Short Term Goal 3 (Week 2): Patient will recall new and functional information with Min A verbal cues.  SLP Short Term Goal 4 (Week 2): Patient will self-monitor and correct errors during tasks with Mod A verbal cues.   Weekly Progress Updates: Patient has made functional gains and has made 3 of 4 STGs this reporting period. Currently, pt demonstrates overall selective attention in a moderately distractive environment for 45 minutes with Min  A verbal cues. Pt also requires Mod A verbal cues for recall and functional problem solving for basic and familiar tasks. However, patient continues to require Mod to Max A verbal cues to self-monitor and correct errors during tasks. Pt/family education ongoing. Patient would benefit from continued skilled SLP intervention to maximize his cognitive function and overall functional independence prior to discharge.     Intensity: Minumum of 1-2 x/day, 30 to 90 minutes Frequency: 3 to 5 out of 7 days Duration/Length of Stay: 01/08/18 Treatment/Interventions: Cognitive remediation/compensation;Cueing hierarchy;Functional tasks;Internal/external aids;Patient/family education;Therapeutic Activities;Environmental controls   Daily Session  Skilled Therapeutic Interventions: Skilled treatment session focused on cognitive goals. SLP facilitated session by providing Min A verbal cues for problem solving during a mildly complex task. Pt required supervision verbal cues for inattention to left upper extremity. Pt demonstrated selective attention to task in a moderately distractive environment with Min A verbal cues for ~50 minutes. Pt required Mod A verbal cues to recall morning schedule. Pt left upright in wheelchair with wife present for transport to PT. Continue with current plan of care.       Function:   Cognition Comprehension Comprehension assist level: Understands basic 90% of the time/cues < 10% of the time  Expression   Expression assist level: Expresses basic 90% of the time/requires cueing < 10% of the time.  Social Interaction Social Interaction assist level: Interacts appropriately 90% of the time - Needs monitoring or encouragement for participation or interaction.  Problem Solving Problem solving assist level: Solves basic 50 - 74%  of the time/requires cueing 25 - 49% of the time  Memory Memory assist level: Recognizes or recalls 75 - 89% of the time/requires cueing 10 - 24% of the time     Pain No/Denied pain  Therapy/Group: Individual Therapy  Meredeth Ide 12/17/2017, 3:23 PM

## 2017-12-17 NOTE — Progress Notes (Signed)
Physical Therapy Session Note  Patient Details  Name: Jaime Pierce MRN: [id number] Date of Birth: January 28, 1946  Today's Date: 12/17/2017 PT Individual Time: 1130-1200 PT Individual Time Calculation (min): 30 min   Short Term Goals: Week 1:  PT Short Term Goal 1 (Week 1): Pt will perform bed mobility modA supine <>sit PT Short Term Goal 2 (Week 1): Pt will perform lateral scoot transfers consistent modA PT Short Term Goal 3 (Week 1): Pt will perform sit <>stand maxA +1 PT Short Term Goal 4 (Week 1): Pt will demonstrate OOB tolerance >4hours/day outside of therapy session  Skilled Therapeutic Interventions/Progress Updates:   Pt received sitting in WC and agreeable to PT  Blocked practice sit<>stand training with emphasis on anterior weight shift and use of BUE to push to standing. Pt required mod assist overall with intermittent min assist to perform sit<>stand.   Gait training x 5 ft with mod assist from PT. Moderate cues from the PT to prevent adduction and improve posture. Stand pivot transfer back to [facility] with mod assist and max cues gait pattern.   Patient returned to room and left sitting in [facility] with call bell in reach and all needs met.        Therapy Documentation Precautions:  Precautions Precautions: Fall Precaution Comments: impaired procioceptive awareness bil.  Restrictions Weight Bearing Restrictions: No Vital Signs: Therapy Vitals Temp: 98 F (36.7 C) Temp Source: Oral Pulse Rate: 75 Resp: 18 BP: 116/60 Patient Position (if appropriate): Lying Oxygen Therapy SpO2: 97 % O2 Device: Not Delivered Pain: 0/10   See Function Navigator for Current Functional Status.   Therapy/Group: Individual Therapy  Lorie Phenix 12/17/2017, 5:17 PM

## 2017-12-17 NOTE — Progress Notes (Signed)
Occupational Therapy Session Note  Patient Details  Name: Jaime FuelRichard A Glasner MRN: [id number] Date of Birth: [birth year]  Today's Date: 12/17/2017 OT Individual Time: 0900-1000 OT Individual Time Calculation (min): 60 min    Short Term Goals: Week 1:  OT Short Term Goal 1 (Week 1): Pt will sit to stand in stedy with MAX A of 1 in prep for functional transfers OT Short Term Goal 2 (Week 1): Pt will maintain dynamic sitting balance with no more than CGA in prep for LB dressing OT Short Term Goal 3 (Week 1): Pt will recall hemi techniques with min VC OT Short Term Goal 4 (Week 1): Pt will groom at sink with MOD A  Skilled Therapeutic Interventions/Progress Updates:    Pt resting in bed upon arrival and dressed for therapy.  Pt required min A for supine>sit EOB with mod verbal cues for sequencing/technique.  Pt performed scoot transfer to w/c with steady A.  Pt transitioned to gym and initially engaged in sit<>stand from EOM X 5, progressing from mod A to steady A.  Pt performed squat pivot transfer to mat with min A. Pt required steady A for standing balance for approx 30 seconds.  Focus transitioned to LUE table activities with emphasis on grasp/realease for small objects.  Focus on controlled movements and assuring pt watches his hand when performing tasks.  Pt attempted 9 hole peg test but was unable to grasp small pegs.  Pt was able to grasp checkers/tokens and stack 5 high. Pt performed scoot transfer back to w/c and returned to room.  Pt remained in w/c with wife present and all needs within reach.   Therapy Documentation Precautions:  Precautions Precautions: Fall Precaution Comments: impaired procioceptive awareness bil.  Restrictions Weight Bearing Restrictions: No Pain: Pain Assessment Pain Assessment: No/denies pain  See Function Navigator for Current Functional Status.   Therapy/Group: Individual Therapy  Rich BraveLanier, Parminder Trapani Chappell 12/17/2017, 12:01 PM

## 2017-12-17 NOTE — Progress Notes (Signed)
[facility] PHYSICAL MEDICINE & REHABILITATION     PROGRESS NOTE    Subjective/Complaints: Had a fair night.  It was a bit restless as his knee was bothering him slightly.  Overall he has felt better with therapies.n  ROS: pt denies nausea, vomiting, diarrhea, cough, shortness of breath or chest pain   Objective: Vital Signs: Blood pressure 136/69, pulse 72, temperature 98.4 F (36.9 C), temperature source Oral, resp. rate 17, height 6' (1.829 m), weight 79 kg (174 lb 2.6 oz), SpO2 94 %. No results found. No results for input(s): WBC, HGB, HCT, PLT in the last 72 hours. No results for input(s): NA, K, CL, GLUCOSE, BUN, CREATININE, CALCIUM in the last 72 hours.  Invalid input(s): CO CBG (last 3)  No results for input(s): GLUCAP in the last 72 hours.  Wt Readings from Last 3 Encounters:  12/15/17 79 kg (174 lb 2.6 oz)  12/07/17 81.4 kg (179 lb 7.3 oz)  12/03/17 79.8 kg (176 lb)    Physical Exam:  Constitutional: He isoriented to person, place, and time.Mood is a bit flat but appropriate HENT:  Head:Normocephalic.  Eyes:EOMI Neck:Normal range of motion.Neck supple.No thyromegalyprese during movement Nt.  Cardiovascular:RRR without murmur. No JVD        Respiratory: CTA Bilaterally without wheezes or rales. Normal effort  WU:JWJXGI:Soft.Bowel sounds are normal. He exhibitsno distension Skin.  Macular rash is maturing and darker in color--improved  Musc: Left shoulder tender to abduction as well as basic rotation.  He has some pain over the left AC joint.  Right knee with slight pain in passive and active range of motion. Neurological: He is  oriented to person, place, and time.   3 out of 5 right deltoid biceps triceps. Wrist essentially 2+ to 3- out of 5. Hand is similar--- mAS 1 to 1+ out of 4. Right lower extremity grossly 3 to 3+ out of 5 proximal distal with difficulty and motor sequencing. Left upper extremity grossly 3+ to 4- out of 5 proximal to distal with  better initiation of movements and awareness of limbs. Left lower extremity grossly  4 out of 5 proximal to distal.   Psych: Pleasant and engaging      Assessment/Plan: 1.  Functional deficits secondary to intracranial hemorrhage which require 3+ hours per day of interdisciplinary therapy in a comprehensive inpatient rehab setting. Physiatrist is providing close team supervision and 24 hour management of active medical problems listed below. Physiatrist and rehab team continue to assess barriers to discharge/monitor patient progress toward functional and medical goals.  Function:  Bathing Bathing position   Position: Shower  Bathing parts Body parts bathed by patient: Right arm, Chest, Abdomen, Right upper leg, Left upper leg Body parts bathed by helper: Left arm, Front perineal area, Buttocks, Right lower leg, Left lower leg, Back  Bathing assist Assist Level: 2 helpers(+2 A to wash buttocks while OT rolls pt )      Upper Body Dressing/Undressing Upper body dressing   What is the patient wearing?: Pull over shirt/dress     Pull over shirt/dress - Perfomed by patient: Thread/unthread left sleeve Pull over shirt/dress - Perfomed by helper: Thread/unthread right sleeve, Put head through opening, Pull shirt over trunk        Upper body assist Assist Level: Touching or steadying assistance(Pt > 75%)      Lower Body Dressing/Undressing Lower body dressing   What is the patient wearing?: Underwear, Pants, American Family Insuranceed Hose, Shoes   Underwear - Performed by helper: Thread/unthread  right underwear leg, Thread/unthread left underwear leg   Pants- Performed by helper: Thread/unthread right pants leg, Thread/unthread left pants leg, Pull pants up/down   Non-skid slipper socks- Performed by helper: Don/doff right sock, Don/doff left sock   Socks - Performed by helper: Don/doff right sock, Don/doff left sock   Shoes - Performed by helper: Don/doff right shoe, Don/doff left shoe, Fasten  left, Fasten right       TED Hose - Performed by helper: Don/doff right TED hose, Don/doff left TED hose  Lower body assist Assist for lower body dressing: 2 Helpers      Financial trader activity did not occur: No continent bowel/bladder event   Toileting steps completed by helper: Adjust clothing prior to toileting, Performs perineal hygiene, Adjust clothing after toileting    Toileting assist Assist level: Two helpers   Transfers Chair/bed transfer Chair/bed transfer activity did not occur: Safety/medical concerns Chair/bed transfer method: Squat pivot Chair/bed transfer assist level: Maximal assist (Pt 25 - 49%/lift and lower) Chair/bed transfer assistive device: Armrests     Locomotion Ambulation Ambulation activity did not occur: Safety/medical concerns   Max distance: 20 Assist level: 2 helpers(min/modA, w/c follow)   Wheelchair Wheelchair activity did not occur: Safety/medical concerns Type: Manual   Assist Level: Dependent (Pt equals 0%)(TIS)  Cognition Comprehension Comprehension assist level: Understands basic 75 - 89% of the time/ requires cueing 10 - 24% of the time  Expression Expression assist level: Expresses basic 90% of the time/requires cueing < 10% of the time.  Social Interaction Social Interaction assist level: Interacts appropriately 75 - 89% of the time - Needs redirection for appropriate language or to initiate interaction.  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  Remains motivated 2. DVT Prophylaxis/Anticoagulation: SCDs. Monitor for any signs of DVT 3. Pain Management:Baclofen 20 mg 3 times a day left shoulder tender with palpation and basic abduction and rotation.   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 Ice to right knee    -Injected right knee 1/22 with improvement.    4. Mood:Provide emotional support 5. Neuropsych: This patientiscapable of making decisions on hisown behalf. 6. Skin/Wound Care:    -Continue hydrocortisone cream Benadryl and hypoallergenic sheets   -Diflucan stopped    -Area gradually improving 7. Fluids/Electrolytes/Nutrition:Continue to encourage p.o. 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 EEG 11/10/2017 negative. Initially on Keppra changed to Depakote 500 mg twice a day 01/15/2019as well as Vimpat 100 mg twice a day   - much more alert overall, seems to have  adjusted to medications  9.Hypertension. No current antihypertensive medication. Monitor with increased mobility 10.COPD with history of tobacco abuse in the past. Continue Dulera 11.  Low-grade temperature, resolved  LOS (Days) 7 A FACE TO FACE EVALUATION WAS PERFORMED  Ranelle Oyster, MD 12/17/2017 8:56 AM

## 2017-12-17 NOTE — Progress Notes (Signed)
Physical Therapy Session Note  Patient Details  Name: Jaime Pierce MRN: [id number] Date of Birth: 1946/09/05  Today's Date: 12/17/2017 PT Individual Time: 1500-1600 PT Individual Time Calculation (min): 60 min   Short Term Goals: Week 1:  PT Short Term Goal 1 (Week 1): Pt will perform bed mobility modA supine <>sit PT Short Term Goal 2 (Week 1): Pt will perform lateral scoot transfers consistent modA PT Short Term Goal 3 (Week 1): Pt will perform sit <>stand maxA +1 PT Short Term Goal 4 (Week 1): Pt will demonstrate OOB tolerance >4hours/day outside of therapy session  Skilled Therapeutic Interventions/Progress Updates:    no c/o pain.  Session focus on NMR via functional mobility retraining and weight shifting.    Pt requires mod assist to come supine>sitting EOB and increased time to scoot to EOB.  Total assist for squat/pivot to R with mod cues for head/hips relationship and decreased pushing.  Pt requires mod assist for squat/pivot to R from w/c>therapy mat x2.    NMR via scooting R with max multimodal cues for forward weight shift, head/hips relationship, and decreased pushing with LUE.  Sit<>stand with therapist in front with max assist focus on forward weight shift with LLE bias and static stance x5 minutes focus on upright posture, knee/hip extension, BOS, and equal weightbearing through R/LLE.  Gait training x20' with RW and consistent min assist, with max cues for increased BOS and upright posture.  Transition to gait with floor marker for visual feedback for BOS, but pt continues to have feet together during stance phase.  Gait in hallway x40' with RW and min assist.  Pt returned to room at end of session and positioned with call bell in reach and needs met.   Therapy Documentation Precautions:  Precautions Precautions: Fall Precaution Comments: impaired procioceptive awareness bil.  Restrictions Weight Bearing Restrictions: No   See Function Navigator for Current  Functional Status.   Therapy/Group: Individual Therapy  Michel Santee 12/17/2017, 4:44 PM

## 2017-12-18 ENCOUNTER — Inpatient Hospital Stay (HOSPITAL_COMMUNITY): Payer: Medicare HMO | Admitting: Occupational Therapy

## 2017-12-18 NOTE — Progress Notes (Signed)
Occupational Therapy Session Note  Patient Details  Name: Jaime Pierce MRN: [id number] Date of Birth: May 06, 1946  Today's Date: 12/18/2017 OT Individual Time: 1100-1208 OT Individual Time Calculation (min): 68 min    Short Term Goals: Week 1:  OT Short Term Goal 1 (Week 1): Pt will sit to stand in stedy with MAX A of 1 in prep for functional transfers OT Short Term Goal 2 (Week 1): Pt will maintain dynamic sitting balance with no more than CGA in prep for LB dressing OT Short Term Goal 3 (Week 1): Pt will recall hemi techniques with min VC OT Short Term Goal 4 (Week 1): Pt will groom at sink with MOD A  Skilled Therapeutic Interventions/Progress Updates:    Pt participated in OT session with focus on ADL retraining and functional mobility.  All transfers completed with Corene CorneaSara Stedy, pt able to stand with Min-Mod A.  Pt participated in shower with A to cleanse L UE, buttocks, perineal area, and bilateral feet/lower legs.  With wash mitt, pt bathed RUE, chest, abdomen, upper thighs, and partially perineal area with cues for sequencing and initiation of next step.  Pt stood in [place] Stedy following shower for thorough cleansing of perineal area/buttocks with assist from his spouse.  Pt in Stedy at sink to [employer] pullover shirt with cues for hemi technique and Max A d/t apraxia.  Pt completed hair care with Min A from spouse.  After transfer to tilt-in-space w/c, pt participated in slow, prolonged stretching/NMR of RUE.  Pt left tilted back in w/c with all needs in reach and spouse present upon OT departure.  Therapy Documentation Precautions:  Precautions Precautions: Fall Precaution Comments: impaired procioceptive awareness bil.  Restrictions Weight Bearing Restrictions: No Pain: Pain Assessment Pain Assessment: No/denies pain  See Function Navigator for Current Functional Status.   Therapy/Group: Individual Therapy  Deitra Mayolexandra  Burnett Lieber 12/18/2017, 12:34 PM

## 2017-12-18 NOTE — Progress Notes (Signed)
Jaime Pierce is a 72 y.o. male [birth year] [id number]  Subjective: No new complaints. No new problems. Slept well. Feeling OK.  Wife at bedside  Objective: Vital signs in last 24 hours: Temp:  [97.4 F (36.3 C)] 97.4 F (36.3 C) (01/26 0614) Pulse Rate:  [66] 66 (01/26 0614) Resp:  [18] 18 (01/26 0614) BP: (138)/(80) 138/80 (01/26 0614) SpO2:  [93 %-97 %] 97 % (01/26 40980614) Weight change:  Last BM Date: 12/17/17  Intake/Output from previous day: 01/25 0701 - 01/26 0700 In: 1070 [P.O.:1070] Out: 650 [Urine:650] Last cbgs: CBG (last 3)  No results for input(s): GLUCAP in the last 72 hours.   Physical Exam General: No apparent distress   HEENT: not dry Lungs: Normal effort. Lungs clear to auscultation, no crackles or wheezes. Cardiovascular: Regular rate and rhythm, no edema Abdomen: S/NT/ND; BS(+) Musculoskeletal:  unchanged Neurological: No new neurological deficits.  Right and left hemiparesis Wounds: N/A    Skin: clear  Aging changes Mental state: Alert, oriented, cooperative    Lab Results: BMET    Component Value Date/Time   NA 134 (L) 12/13/2017 0729   K 4.1 12/13/2017 0729   CL 98 (L) 12/13/2017 0729   CO2 24 12/13/2017 0729   GLUCOSE 100 (H) 12/13/2017 0729   GLUCOSE 113 (H) 09/21/2006 0832   BUN 20 12/13/2017 0729   CREATININE 0.62 12/13/2017 0729   CALCIUM 8.9 12/13/2017 0729   GFRNONAA >60 12/13/2017 0729   GFRAA >60 12/13/2017 0729   CBC    Component Value Date/Time   WBC 9.8 12/13/2017 0729   RBC 4.49 12/13/2017 0729   HGB 13.7 12/13/2017 0729   HCT 40.4 12/13/2017 0729   PLT 272 12/13/2017 0729   MCV 90.0 12/13/2017 0729   MCH 30.5 12/13/2017 0729   MCHC 33.9 12/13/2017 0729   RDW 14.1 12/13/2017 0729   LYMPHSABS 1.1 12/13/2017 0729   MONOABS 1.7 (H) 12/13/2017 0729   EOSABS 0.9 (H) 12/13/2017 0729   BASOSABS 0.1 12/13/2017 0729    Studies/Results: No results found.  Medications: I have reviewed the patient's current  medications.  Assessment/Plan:  1. 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  CIR 2.  DVT plexus with SCD 3.  Pain management with baclofen.  Voltaren gel topically 4.  History of partial seizure.  On Depakote and Vimpat. 5.  Hypertension off blood pressure meds.  We are monitoring blood pressure 6.  COPD continue Dulera MDI.    LOS (Days) 7       Length of stay, days: 8  Sonda PrimesAlex Plotnikov , MD 12/18/2017, 3:51 PM

## 2017-12-19 ENCOUNTER — Inpatient Hospital Stay (HOSPITAL_COMMUNITY): Payer: Medicare HMO

## 2017-12-19 NOTE — Progress Notes (Signed)
Jaime Pierce is a 72 y.o. male 1946-04-24 [id number]  Subjective: Complains of spasms and clonic movements in the legs. No other new problems. Slept well. Feeling OK.  Wife was at bedside.  Objective: Vital signs in last 24 hours: Temp:  [97.5 F (36.4 C)-97.7 F (36.5 C)] 97.5 F (36.4 C) (01/27 1545) Pulse Rate:  [77-79] 79 (01/27 1545) Resp:  [17-18] 18 (01/27 1545) BP: (114-115)/(59-82) 114/59 (01/27 1545) SpO2:  [94 %-95 %] 95 % (01/27 1545) Weight change:  Last BM Date: 12/19/17  Intake/Output from previous day: 01/26 0701 - 01/27 0700 In: 275 [P.O.:275] Out: 650 [Urine:650] Last cbgs: CBG (last 3)  No results for input(s): GLUCAP in the last 72 hours.   Physical Exam General: No apparent distress   HEENT: not dry Lungs: Normal effort. Lungs clear to auscultation, no crackles or wheezes. Cardiovascular: Regular rate and rhythm, no edema Abdomen: S/NT/ND; BS(+) Musculoskeletal:  unchanged Neurological: No new neurological deficits Wounds: N/A    Skin: clear  Aging changes Mental state: Alert, oriented, cooperative    Lab Results: BMET    Component Value Date/Time   NA 134 (L) 12/13/2017 0729   K 4.1 12/13/2017 0729   CL 98 (L) 12/13/2017 0729   CO2 24 12/13/2017 0729   GLUCOSE 100 (H) 12/13/2017 0729   GLUCOSE 113 (H) 09/21/2006 0832   BUN 20 12/13/2017 0729   CREATININE 0.62 12/13/2017 0729   CALCIUM 8.9 12/13/2017 0729   GFRNONAA >60 12/13/2017 0729   GFRAA >60 12/13/2017 0729   CBC    Component Value Date/Time   WBC 9.8 12/13/2017 0729   RBC 4.49 12/13/2017 0729   HGB 13.7 12/13/2017 0729   HCT 40.4 12/13/2017 0729   PLT 272 12/13/2017 0729   MCV 90.0 12/13/2017 0729   MCH 30.5 12/13/2017 0729   MCHC 33.9 12/13/2017 0729   RDW 14.1 12/13/2017 0729   LYMPHSABS 1.1 12/13/2017 0729   MONOABS 1.7 (H) 12/13/2017 0729   EOSABS 0.9 (H) 12/13/2017 0729   BASOSABS 0.1 12/13/2017 0729    Studies/Results: No results found.  Medications:  I have reviewed the patient's current medications.  Assessment/Plan:   1.  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  CIR. We discussed the brain MRI findings with the patient and his wife in respect to probable early amyloid angiopathy.  We discussed general supportive measures as a mainstay of treatment and hemorrhage prophylaxis.  We discussed avoidance of anticoagulation and antiplatelet agents use.  Blood pressure control is important however we may let her go a little high.  She and Jaime Pierce will have further discussion with Dr. Pearlean BrownieSethi on an outpatient basis. 2.  DVT prophylaxis with SCD  3. Pain management with baclofen and topical Voltaren 4.  History of partial seizure.  Currently on Depakote and Vimpat 5.  Hypertension.  The patient is currently off blood pressure meds continue to monitor blood pressure 6.  COPD.  On Dulera inhaler 7.  Spasms and clonic movements.  He is on baclofen.        Length of stay, days: 9  Sonda PrimesAlex Yarden Manuelito , MD 12/19/2017, 7:34 PM

## 2017-12-19 NOTE — Progress Notes (Signed)
Physical Therapy Session Note  Patient Details  Name: Jaime Pierce MRN: [id number] Date of Birth: [birth year]  Today's Date: 12/19/2017 PT Individual Time: 1102-1200 PT Individual Time Calculation (min): 58 min   Short Term Goals: Week 1:  PT Short Term Goal 1 (Week 1): Pt will perform bed mobility modA supine <>sit PT Short Term Goal 2 (Week 1): Pt will perform lateral scoot transfers consistent modA PT Short Term Goal 3 (Week 1): Pt will perform sit <>stand maxA +1 PT Short Term Goal 4 (Week 1): Pt will demonstrate OOB tolerance >4hours/day outside of therapy session  Skilled Therapeutic Interventions/Progress Updates:    Pt supine in bed upon PT arrival, agreeable to therapy tx and reports pain 5/10 in R wrist and R hip from muscle spasms earlier this AM. Pt transferred from supine>sitting EOB with mod assist, verbal cues for techniques. Pt transferred from bed>TIS w/c with total assist. Pt transported to gym in w/c. Pt performed sit<>stand with mod assist and ambulated x 10 ft with RW and min assist, shortened step length and increased R tone. Pt reports increased wrist pain with WB through RW. Pt transferred from w/c<>mat total assist, squat pivot transfer with max verbal cues for techniques. Therapist performed R LE stretching for tone and ROM x 5min. Pt transferred from supine>sitting with mod assist and verbal cues. In sitting therapist performed R scapular mobilizations, R elbow/wrist ROM and stretching for pain relief and ROM. Pt performed sit<>stands from mat using mirror to work on midline, 1st trial pt requiring max assist to maintain standing balance, second trial pt with improved midline/decreased pushing and able to maintain standing balance without UE support, min assist x 3 minutes. Pt transferred back to w/c and left seated in w/c in room with wife present.   Therapy Documentation Precautions:  Precautions Precautions: Fall Precaution Comments: impaired procioceptive  awareness bil.  Restrictions Weight Bearing Restrictions: No   See Function Navigator for Current Functional Status.   Therapy/Group: Individual Therapy  Cresenciano GenreEmily van Schagen, PT, DPT 12/19/2017, 8:04 AM

## 2017-12-20 ENCOUNTER — Inpatient Hospital Stay (HOSPITAL_COMMUNITY): Payer: Medicare HMO | Admitting: Speech Pathology

## 2017-12-20 ENCOUNTER — Inpatient Hospital Stay (HOSPITAL_COMMUNITY): Payer: Medicare HMO | Admitting: Occupational Therapy

## 2017-12-20 ENCOUNTER — Inpatient Hospital Stay (HOSPITAL_COMMUNITY): Payer: Medicare HMO | Admitting: Physical Therapy

## 2017-12-20 ENCOUNTER — Encounter: Payer: Medicare HMO | Admitting: Occupational Therapy

## 2017-12-20 ENCOUNTER — Ambulatory Visit: Payer: Medicare HMO | Admitting: Physical Therapy

## 2017-12-20 MED ORDER — BACLOFEN 20 MG PO TABS
20.0000 mg | ORAL_TABLET | Freq: Four times a day (QID) | ORAL | Status: DC
  Administered 2017-12-20 – 2017-12-22 (×8): 20 mg via ORAL
  Filled 2017-12-20 (×10): qty 1

## 2017-12-20 NOTE — Progress Notes (Signed)
Occupational Therapy Session Note  Patient Details  Name: Jaime Pierce MRN: [id number] Date of Birth: Feb 20, 1946  Today's Date: 12/20/2017 OT Individual Time: 1000-1100 OT Individual Time Calculation (min): 60 min    Short Term Goals: Week 1:  OT Short Term Goal 1 (Week 1): Pt will sit to stand in stedy with MAX A of 1 in prep for functional transfers OT Short Term Goal 2 (Week 1): Pt will maintain dynamic sitting balance with no more than CGA in prep for LB dressing OT Short Term Goal 3 (Week 1): Pt will recall hemi techniques with min VC OT Short Term Goal 4 (Week 1): Pt will groom at sink with MOD A  Skilled Therapeutic Interventions/Progress Updates:    Upon entering the room, pt seated in wheelchair with Wife present in room. Pt with no c/o pain this session and agreeable to OT intervention. Pt standing from wheelchair with max A into standing with R knee blocked and pt displaying increased pushing to the R with transitional movement. Pt standing for 5 minutes with max A for balance progressing to min A. Pt sitting secondary to fatigue. Pt engaged in various fine motor coordination tasks such as digit opposition, writing name, and moderate difficulty maze. Pt displays gross grasp with writing instrument to write name. It is not legible but pt able to correctly state the letters of name. Pt tracing line through moderately difficulty curved maze with 8 errors present where pt touched line or crossed line while following curve. Pt with decreased control of movement. Pt ambulating 10' with RW and max A. Pt required manual facilitation for weight shifting and advancement of R LE. Pt having difficulty problem solving sequence and required max multimodal cues for this task. Pt standing at table with R UE in weight bearing position for 10 minutes. Pt returned to sitting at end of session and assisted back to room via wheelchair. All needs within reach and wife remains with pt.   Therapy  Documentation Precautions:  Precautions Precautions: Fall Precaution Comments: impaired procioceptive awareness bil.  Restrictions Weight Bearing Restrictions: No General:   Vital Signs: Therapy Vitals Temp: 97.6 F (36.4 C) Temp Source: Oral Pulse Rate: 94 Resp: 18 BP: 130/83 Patient Position (if appropriate): Sitting Oxygen Therapy SpO2: 94 % O2 Device: Not Delivered  See Function Navigator for Current Functional Status.   Therapy/Group: Individual Therapy  Alen BleacherBradsher, Thad Osoria P 12/20/2017, 4:09 PM

## 2017-12-20 NOTE — Plan of Care (Signed)
  RH SKIN INTEGRITY RH STG MAINTAIN SKIN INTEGRITY WITH ASSISTANCE Description STG Maintain Skin Integrity With Assistance. 12/20/2017 1718 - Progressing by Thurston HoleBennett, Shaivi Rothschild, RN   RH SAFETY RH OTHER STG SAFETY GOALS W/ASSIST Description Other STG Safety Goals With Assistance. 12/20/2017 1718 - Progressing by Thurston HoleBennett, Kathi Dohn, RN   RH KNOWLEDGE DEFICIT RH STG INCREASE KNOWLEDGE OF HYPERTENSION 12/20/2017 1718 - Progressing by Thurston HoleBennett, Kaisa Wofford, RN

## 2017-12-20 NOTE — Progress Notes (Signed)
[place] PHYSICAL MEDICINE & REHABILITATION     PROGRESS NOTE    Subjective/Complaints: Had more "spasms" over the weekend.  Also experiencing some pain in the right arm.  Otherwise no new issues  ROS: pt denies nausea, vomiting, diarrhea, cough, shortness of breath or chest pain   Objective: Vital Signs: Blood pressure 134/70, pulse 82, temperature 97.9 F (36.6 C), temperature source Oral, resp. rate 17, height 6' (1.829 m), weight 79 kg (174 lb 2.6 oz), SpO2 92 %. No results found. No results for input(s): WBC, HGB, HCT, PLT in the last 72 hours. No results for input(s): NA, K, CL, GLUCOSE, BUN, CREATININE, CALCIUM in the last 72 hours.  Invalid input(s): CO CBG (last 3)  No results for input(s): GLUCAP in the last 72 hours.  Wt Readings from Last 3 Encounters:  12/15/17 79 kg (174 lb 2.6 oz)  12/07/17 81.4 kg (179 lb 7.3 oz)  12/03/17 79.8 kg (176 lb)    Physical Exam:  Constitutional: He isoriented to person, place, and time.Mood is a bit flat but appropriate HENT:  Head:Normocephalic.  Eyes:EOMI Neck:Normal range of motion.Neck supple.No thyromegalyprese during movement Nt.  Cardiovascular:RRR without murmur. No JVD         Respiratory: CTA Bilaterally without wheezes or rales. Normal effort   WU:JWJX.Bowel sounds are normal. He exhibitsno distension Skin.  Macular rash is maturing and darker in color--improved  Musc: Left shoulder tender to abduction as well as basic rotation.  He has some pain over the left AC joint.  Right knee with slight pain in passive and active range of motion. Neurological: He is  oriented to person, place, and time.   3 out of 5 right deltoid biceps triceps. Wrist essentially 2+ to 3- out of 5. Hand is similar--- mAS 1+-2 out of 4. Right lower extremity grossly 3 to 3+ out of 5 proximal distal with difficulty and motor sequencing. mAS 1+ to 2 out of 4lft upper extremity.  Right arm somewhat sensitive to touch today.  Grossly  3+ to 4- out of 5 proximal to distal with better initiation of movements and awareness of limbs. Left lower extremity grossly  4 out of 5 proximal to distal.   Psych: Pleasant and engaging      Assessment/Plan: 1.  Functional deficits secondary to intracranial hemorrhage which require 3+ hours per day of interdisciplinary therapy in a comprehensive inpatient rehab setting. Physiatrist is providing close team supervision and 24 hour management of active medical problems listed below. Physiatrist and rehab team continue to assess barriers to discharge/monitor patient progress toward functional and medical goals.  Function:  Bathing Bathing position   Position: Shower  Bathing parts Body parts bathed by patient: Right arm, Chest, Abdomen, Right upper leg, Left upper leg Body parts bathed by helper: Left arm, Front perineal area, Buttocks, Right lower leg, Left lower leg, Back  Bathing assist Assist Level: 2 helpers(+2 A to wash buttocks while OT rolls pt )      Upper Body Dressing/Undressing Upper body dressing   What is the patient wearing?: Pull over shirt/dress     Pull over shirt/dress - Perfomed by patient: Thread/unthread left sleeve Pull over shirt/dress - Perfomed by helper: Thread/unthread right sleeve, Put head through opening, Pull shirt over trunk        Upper body assist Assist Level: Touching or steadying assistance(Pt > 75%)      Lower Body Dressing/Undressing Lower body dressing   What is the patient wearing?: Underwear, Pants, Felicity Pellegrini  Hose, Shoes   Underwear - Performed by helper: Thread/unthread right underwear leg, Thread/unthread left underwear leg   Pants- Performed by helper: Thread/unthread right pants leg, Thread/unthread left pants leg, Pull pants up/down   Non-skid slipper socks- Performed by helper: Don/doff right sock, Don/doff left sock   Socks - Performed by helper: Don/doff right sock, Don/doff left sock   Shoes - Performed by helper: Don/doff  right shoe, Don/doff left shoe, Fasten left, Fasten right       TED Hose - Performed by helper: Don/doff right TED hose, Don/doff left TED hose  Lower body assist Assist for lower body dressing: 2 Helpers      Financial traderToileting Toileting Toileting activity did not occur: No continent bowel/bladder event   Toileting steps completed by helper: Adjust clothing prior to toileting, Performs perineal hygiene, Adjust clothing after toileting    Toileting assist Assist level: Two helpers   Transfers Chair/bed transfer Chair/bed transfer activity did not occur: Safety/medical concerns Chair/bed transfer method: Squat pivot Chair/bed transfer assist level: Total assist (Pt < 25%) Chair/bed transfer assistive device: Armrests     Locomotion Ambulation Ambulation activity did not occur: Safety/medical concerns   Max distance: 10 ft Assist level: Touching or steadying assistance (Pt > 75%)   Wheelchair Wheelchair activity did not occur: Safety/medical concerns Type: Manual Max wheelchair distance: 10 ft Assist Level: Touching or steadying assistance (Pt > 75%)(B LEs)  Cognition Comprehension Comprehension assist level: Understands basic 90% of the time/cues < 10% of the time  Expression Expression assist level: Expresses basic 90% of the time/requires cueing < 10% of the time.  Social Interaction Social Interaction assist level: Interacts appropriately 90% of the time - Needs monitoring or encouragement for participation or interaction.  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   2. DVT Prophylaxis/Anticoagulation: SCDs. Monitor for any signs of DVT 3. 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 Ice to right knee    -Injected right knee 1/22 with improvement.         -Suspect spasticity is contributing to pain.  Increase baclofen to 20 mg 4 times daily 4. Mood:Provide emotional support 5. Neuropsych: This patientiscapable of making decisions on hisown behalf. 6. Skin/Wound Care:    -Continue hydrocortisone cream Benadryl and hypoallergenic sheets   -Diflucan stopped    -Area  improving 7. Fluids/Electrolytes/Nutrition:Continue to encourage p.o. 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 EEG 11/10/2017 negative. Initially on Keppra changed to Depakote 500 mg twice a day 01/15/2019as well as Vimpat 100 mg twice a day   - much more alert overall, seems to have  adjusted to medications  9.Hypertension. No current antihypertensive medication. Monitor with increased mobility 10.COPD with history of tobacco abuse in the past. Continue Dulera 11.  Low-grade temperature, resolved  LOS (Days) 10 A FACE TO FACE EVALUATION WAS PERFORMED  Ranelle OysterSWARTZ,Neveen Daponte T, MD  12/20/2017 9:00 AM

## 2017-12-20 NOTE — Progress Notes (Signed)
Speech Language Pathology Daily Session Note  Patient Details  Name: Jaime Pierce MRN: [id number] Date of Birth: 1946/06/15  Today's Date: 12/20/2017 SLP Individual Time: 0900-1000 SLP Individual Time Calculation (min): 60 min  Short Term Goals: Week 2: SLP Short Term Goal 1 (Week 2): Patient will demonstrate selective attention to a task in a moderately distracting enviornment for ~45 Minutes with supervision verbal cues for redirection.  SLP Short Term Goal 2 (Week 2): Patient will demonstrate functional problem solving for mildly complex tasks with Min A verbal cues.  SLP Short Term Goal 3 (Week 2): Patient will recall new and functional information with Min A verbal cues.  SLP Short Term Goal 4 (Week 2): Patient will self-monitor and correct errors during tasks with Mod A verbal cues.   Skilled Therapeutic Interventions: Skilled treatment session focused on cognitive goals. SLP facilitated session by providing Mod A verbal cues for problem solving during a novel, mildly complex card task (Blink). Pt was able to demonstrate selective attention in a moderately distractive environment for ~55 minutes with Supervision verbal cues for redirection. Pt required Mod A verbal cues to self-monitor and self-correct errors during the task. Pt left upright in wheelchair with wife present. Continue with current plan of care.    Function:  Cognition Comprehension Comprehension assist level: Understands complex 90% of the time/cues 10% of the time  Expression   Expression assist level: Expresses basic 90% of the time/requires cueing < 10% of the time.  Social Interaction Social Interaction assist level: Interacts appropriately 90% of the time - Needs monitoring or encouragement for participation or interaction.  Problem Solving Problem solving assist level: Solves basic 75 - 89% of the time/requires cueing 10 - 24% of the time  Memory Memory assist level: Recognizes or recalls 90% of the  time/requires cueing < 10% of the time    Pain Pain Assessment Pain Assessment: No/denies pain  Therapy/Group: Individual Therapy  Roxan HockeyYahui Alegra Rost  SLP - Student 12/20/2017, 12:54 PM

## 2017-12-20 NOTE — Progress Notes (Signed)
Physical Therapy Weekly Progress Note  Patient Details  Name: Jaime Pierce MRN: [id number] Date of Birth: [birth year]  Beginning of progress report period: December 11, 2017 End of progress report period: December 20, 2017  Today's Date: 12/20/2017 PT Individual Time: 1300-1415 PT Individual Time Calculation (min): 75 min   Patient has met 2 of 4 short term goals.  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 Wife is at bedside most of the time and is very helpful, has not completed any hands-on training yet.  Patient continues to demonstrate the following deficits muscle weakness and muscle joint tightness, impaired timing and sequencing, abnormal tone, unbalanced muscle activation, motor apraxia, decreased coordination and decreased motor planning and decreased midline orientation, decreased motor planning and ideational apraxia and therefore will continue to benefit from skilled PT intervention to increase functional independence with mobility.  Patient progressing toward long term goals..  Continue plan of care.  PT Short Term Goals Week 1:  PT Short Term Goal 1 (Week 1): Pt will perform bed mobility modA supine<>sit from flat bed PT Short Term Goal 1 - Progress (Week 1): Updated due to goal met PT Short Term Goal 2 (Week 1): Pt will perform lateral scoot transfers consistent modA PT Short Term Goal 2 - Progress (Week 1): Progressing toward goal PT Short Term Goal 3 (Week 1): Pt will perform sit <>stand maxA +1 PT Short Term Goal 3 - Progress (Week 1): Progressing toward goal PT Short Term Goal 4 (Week 1): Pt will demonstrate OOB tolerance >4hours/day outside of therapy session PT Short Term Goal 4 - Progress (Week 1): Met PT Short Term Goal 5 (Week 2): Pt  will ambulate 25 feet using LRAD mod A +1   Skilled Therapeutic Interventions/Progress Updates:    Pt was greeted sitting upright in WC in room, agreeable to therapy. Transitions supine<>sit with modA for RLE and trunk management. Extended time spent on tactile and verbal cuing scooting in sitting EOB, increased success with external cues. Pt transfers bed<>WC<>kinetron using squat pivot with mod/max A +2 in three [place]keteers. Utilized kinetron to emphasize reciprocal stepping pattern, equal weightbearing, and weightshifting, visual cues in mirror to maintain upright and midline, pt demonstrates minimal WBing on RLE despite maximal verbal and tactile cues, able to extend R knee in sitting but does not translate in standing. Pt transported to therapy gym, transfers WC<>mat using squat pivot technique described above. Performs sit<>stand using RW with various techniques to facilitate WBing on RLE including step under LLE, wedge under L buttocks, yoga block between feet to facilitate increased BOS. Performs yoga ball roll-outs using LUE to emphasize anterior weight shift and L trunk rotation, demonstrates minimal L trunk rotation past midline and attempts to scoot hips vs reach with UE. Pt ambulates approximately 15 feet using RW, max A +2 to maintain upright, moderate vcing for stepping pattern and to increase BOS and step length. Provided pt with manual WC vs TIS d/t appropriate arousal. At end of session, pt returned to room in manual [facility], wife present, denies any needs at this time.   Therapy Documentation Precautions:  Precautions Precautions: Fall Precaution Comments: impaired procioceptive awareness bil.  Restrictions Weight Bearing Restrictions: No General:   Vital Signs: Therapy Vitals Temp: 97.6 F (36.4 C) Temp Source: Oral Pulse Rate: 94 Resp: 18  BP: 130/83 Patient Position (if appropriate): Sitting Oxygen Therapy SpO2: 94 % O2 Device: Not Delivered  See Function Navigator for  Current Functional Status.  Therapy/Group: Individual Therapy  Salvatore Decent 12/20/2017, 4:01 PM

## 2017-12-21 ENCOUNTER — Inpatient Hospital Stay (HOSPITAL_COMMUNITY): Payer: Medicare HMO

## 2017-12-21 ENCOUNTER — Inpatient Hospital Stay (HOSPITAL_COMMUNITY): Payer: Medicare HMO | Admitting: Physical Therapy

## 2017-12-21 ENCOUNTER — Inpatient Hospital Stay (HOSPITAL_COMMUNITY): Payer: Medicare HMO | Admitting: Speech Pathology

## 2017-12-21 MED ORDER — GABAPENTIN 100 MG PO CAPS
100.0000 mg | ORAL_CAPSULE | Freq: Two times a day (BID) | ORAL | Status: DC
  Administered 2017-12-21 – 2017-12-22 (×3): 100 mg via ORAL
  Filled 2017-12-21 (×3): qty 1

## 2017-12-21 NOTE — Progress Notes (Signed)
Patient's spouse called because patient had new onset "fidgeting and twitching." Patient's face was flushed and he complained of feeling the chills. Respirations 24 and heart rate 100. NP notified. Ordered to hold gabapentin and baclofen on 2000 dose. Re-check vitals within normal, patient resting more comfortably. Reported to oncoming RN.

## 2017-12-21 NOTE — Progress Notes (Signed)
Occupational Therapy Session Note  Patient Details  Name: Jaime Pierce MRN: [id number] Date of Birth: January 29, 1946  Today's Date: 12/21/2017 OT Individual Time: 0900-1000 OT Individual Time Calculation (min): 60 min    Short Term Goals: Week 2:  OT Short Term Goal 1 (Week 2): Pt will don pullover shirt with mod A OT Short Term Goal 2 (Week 2): Pt will perform sit<>stand with mod A in preparation for pulling up pants OT Short Term Goal 3 (Week 2): Pt will perform grooming tasks with min A OT Short Term Goal 4 (Week 2): Pt will perfomr BSC transfer with mod A  Skilled Therapeutic Interventions/Progress Updates:    Pt resting in w/c upon arrival with wife present.  Initial focus on shaving task at sink.  Pt completed shaving L side of face with min A for positioning of razor.  Pt required tot A to complete R side of face. Pt required max verbal cues for sequencing during task.  Pt transitioned to gym and transferred to mat with max A.  Pt exhibited motor planning difficulties and requires max multimodal cues for sequencing during transfer.  Pt engaged in table tasks with LUE-grasp/release of variety of object sizes.  Pt completed task with max verbal cues for sequencing.  Pt returned to w/c and transitioned to day room to await SLP.  Therapy Documentation Precautions:  Precautions Precautions: Fall Precaution Comments: impaired procioceptive awareness bil.  Restrictions Weight Bearing Restrictions: No    Pain: Pain Assessment Pain Assessment: No/denies pain  See Function Navigator for Current Functional Status.   Therapy/Group: Individual Therapy  Rich BraveLanier, Tationna Fullard Chappell 12/21/2017, 12:05 PM

## 2017-12-21 NOTE — Progress Notes (Signed)
Speech Language Pathology Daily Session Note  Patient Details  Name: Jaime FuelRichard A Caravello MRN: [id number] Date of Birth: [birth year]  Today's Date: 12/21/2017 SLP Individual Time: 0815-0900 SLP Individual Time Calculation (min): 45 min  Short Term Goals: Week 2: SLP Short Term Goal 1 (Week 2): Patient will demonstrate selective attention to a task in a moderately distracting enviornment for ~45 Minutes with supervision verbal cues for redirection.  SLP Short Term Goal 2 (Week 2): Patient will demonstrate functional problem solving for mildly complex tasks with Min A verbal cues.  SLP Short Term Goal 3 (Week 2): Patient will recall new and functional information with Min A verbal cues.  SLP Short Term Goal 4 (Week 2): Patient will self-monitor and correct errors during tasks with Mod A verbal cues.   Skilled Therapeutic Interventions: Skilled treatment session focused on cognitive goals. SLP facilitated session by providing Min A verbal cues for recall of procedures to a previously learned task (yesterday's session - Blink). SLP further facilitated session by providing Mod A verbal cues for problem solving during a novel, moderately complex card task (Call-It). Pt required supervision verbal cues for recall of new information. Pt demonstrated selective attention in a moderately distractive environment for ~30 minutes with Supervision verbal cues for redirection. Pt left upright in wheelchair with all needs within reach and wife present. Continue with current plan of care.   Function:  Cognition Comprehension Comprehension assist level: Understands basic 75 - 89% of the time/ requires cueing 10 - 24% of the time  Expression   Expression assist level: Expresses basic 90% of the time/requires cueing < 10% of the time.  Social Interaction Social Interaction assist level: Interacts appropriately 90% of the time - Needs monitoring or encouragement for participation or interaction.  Problem Solving  Problem solving assist level: Solves basic 75 - 89% of the time/requires cueing 10 - 24% of the time  Memory Memory assist level: Recognizes or recalls 90% of the time/requires cueing < 10% of the time    Pain Pain Assessment Pain Assessment: No/denies pain  Therapy/Group: Individual Therapy  Roxan HockeyYahui Kohl Polinsky 12/21/2017, 12:06 PM

## 2017-12-21 NOTE — Progress Notes (Signed)
Occupational Therapy Weekly Progress Note  Patient Details  Name: Jaime Pierce MRN: [id number] Date of Birth: 1946-10-19  Beginning of progress report period: December 11, 2017 End of progress report period: December 21, 2017  Patient has met 4 of 4 short term goals.  Pt made steady progress with BADLs, functional transfers, sitting balance and functional use of LUE.  Pt performs sit<>stand in [place] with mod A to assist with LB bathing and dressing tasks.  Pt recalls UB dressing techniques but currently requires max A to complete tasks. Pt maintains sitting balance during functional tasks with min A.  Pt's LUE continues to exhibit ataxic movements during tasks.  Pt requires mod verbal cues to look at his LUE during functional tasks since sensation is absent/diminished.   Patient continues to demonstrate the following deficits: muscle weakness, decreased cardiorespiratoy endurance, abnormal tone, decreased coordination and decreased motor planning, decreased initiation, decreased attention, decreased awareness, decreased problem solving, decreased safety awareness and delayed processing and decreased sitting balance, decreased standing balance, decreased postural control, hemiplegia and decreased balance strategies and therefore will continue to benefit from skilled OT intervention to enhance overall performance with BADL.  Patient progressing toward long term goals..  Continue plan of care.  OT Short Term Goals Week 1:  OT Short Term Goal 1 (Week 1): Pt will sit to stand in stedy with MAX A of 1 in prep for functional transfers OT Short Term Goal 1 - Progress (Week 1): Met OT Short Term Goal 2 (Week 1): Pt will maintain dynamic sitting balance with no more than CGA in prep for LB dressing OT Short Term Goal 2 - Progress (Week 1): Met OT Short Term Goal 3 (Week 1): Pt will recall hemi techniques with min VC OT Short Term Goal 3 - Progress (Week 1): Met OT Short Term Goal 4 (Week 1): Pt will  groom at sink with MOD A OT Short Term Goal 4 - Progress (Week 1): Met Week 2:  OT Short Term Goal 1 (Week 2): Pt will don pullover shirt with mod A OT Short Term Goal 2 (Week 2): Pt will perform sit<>stand with mod A in preparation for pulling up pants OT Short Term Goal 3 (Week 2): Pt will perform grooming tasks with min A OT Short Term Goal 4 (Week 2): Pt will perfomr BSC transfer with mod A  Skilled Therapeutic Interventions/Progress Updates:      Therapy Documentation Precautions:  Precautions Precautions: Fall Precaution Comments: impaired procioceptive awareness bil.  Restrictions Weight Bearing Restrictions: No  See Function Navigator for Current Functional Status.     Leotis Shames [facility] 12/21/2017, 6:55 AM

## 2017-12-21 NOTE — Progress Notes (Signed)
Speech Language Pathology Daily Session Note  Patient Details  Name: Jaime Pierce MRN: [id number] Date of Birth: 1946/01/28  Today's Date: 12/21/2017 SLP Individual Time: 1000-1055 SLP Individual Time Calculation (min): 55 min  Short Term Goals: Week 2: SLP Short Term Goal 1 (Week 2): Patient will demonstrate selective attention to a task in a moderately distracting enviornment for ~45 Minutes with supervision verbal cues for redirection.  SLP Short Term Goal 2 (Week 2): Patient will demonstrate functional problem solving for mildly complex tasks with Min A verbal cues.  SLP Short Term Goal 3 (Week 2): Patient will recall new and functional information with Min A verbal cues.  SLP Short Term Goal 4 (Week 2): Patient will self-monitor and correct errors during tasks with Mod A verbal cues.   Skilled Therapeutic Interventions: Skilled ST services focused on cognitive skills. SLP facilitated recall of novel activity, Blink,pt required min A verbal cues for recall of rules and mod A verbal cues for recall of rules during game. SLP facilitated mildly complex problem solving with Blink, played at its simplest form pt required Min A verbal cues, however required max-mod A verbal cues for planning ahead when played at a more complex level with mod A verbal cues for monitor/correcting errors. Pt demonstrated functional problem solving with basic money management tasks requiring extra time, however given daily math problems required max A verbal cues due to suggested impairment in working memory compared to problem solving. Wife returned pt to room. Recommend to continue skilled ST services.      Function:  Eating Eating                 Cognition Comprehension Comprehension assist level: Understands basic 75 - 89% of the time/ requires cueing 10 - 24% of the time  Expression   Expression assist level: Expresses basic 90% of the time/requires cueing < 10% of the time.  Social Interaction  Social Interaction assist level: Interacts appropriately 90% of the time - Needs monitoring or encouragement for participation or interaction.  Problem Solving Problem solving assist level: Solves basic 75 - 89% of the time/requires cueing 10 - 24% of the time;Solves basic 50 - 74% of the time/requires cueing 25 - 49% of the time  Memory Memory assist level: Recognizes or recalls 75 - 89% of the time/requires cueing 10 - 24% of the time;Recognizes or recalls 50 - 74% of the time/requires cueing 25 - 49% of the time    Pain Pain Assessment Pain Assessment: No/denies pain  Therapy/Group: Individual Therapy  Jaime Pierce  [facility] 12/21/2017, 4:48 PM

## 2017-12-21 NOTE — Progress Notes (Signed)
Physical Therapy Session Note  Patient Details  Name: Jaime FuelRichard A Pierce MRN: [id number] Date of Birth: July 07, 1946  Today's Date: 12/21/2017 PT Individual Time: 1430-1530 PT Individual Time Calculation (min): 60 min    Skilled Therapeutic Interventions/Progress Updates:    Pt was greeted supine resting in bed, daughter present throughout session, agreeable to therapy. Pt transitions supine to sitting EOB with mod A for trunk and RLE management. Pt continues to have difficulties with scooting EOB despite maximal verbal and tactile cues. Performs stand pivot transfer using three musketeers technique. [facility], transfers to straddling bench with maxA +2 to get on/off and maximal verbal cues for scooting forward. Utilizing dynavision to emphasize anterior weightshifting, overhead reaching and reaching to L side, pt demonstrates difficulty with motor planning to complete task, attempts to simplify task by scooting forward vs reaching past limits of stability, requires therapist assistance in supporting LUE to complete task. Utilizing standing frame with step under LLE, block under L forearm to dissuade from pushing, pt demonstrates improved ability to weightbear through RLE, constant cues to maintain upright posture, pt performs glute sets and mini squats with verbal and tactile cues for technique. Pt hesitant to trust standing frame however confidence improves throughout session. At end opf session, [facility], call bell within reach, daughter present, denies any needs at this time  Therapy Documentation Precautions:  Precautions Precautions: Fall Precaution Comments: impaired procioceptive awareness bil.  Restrictions Weight Bearing Restrictions: No  See Function Navigator for Current Functional Status.   Therapy/Group: Individual Therapy  Jaime Pierce 12/21/2017, 3:46 PM

## 2017-12-21 NOTE — Progress Notes (Signed)
[place] PHYSICAL MEDICINE & REHABILITATION     PROGRESS NOTE    Subjective/Complaints: Still having problems with tightness in right arm and leg. Difficult to put down right leg with ambulation. occ cough  ROS: pt denies nausea, vomiting, diarrhea,   shortness of breath or chest pain   Objective: Vital Signs: Blood pressure 126/71, pulse 81, temperature 98.9 F (37.2 C), temperature source Oral, resp. rate 16, height 6' (1.829 m), weight 79 kg (174 lb 2.6 oz), SpO2 92 %. No results found. No results for input(s): WBC, HGB, HCT, PLT in the last 72 hours. No results for input(s): NA, K, CL, GLUCOSE, BUN, CREATININE, CALCIUM in the last 72 hours.  Invalid input(s): CO CBG (last 3)  No results for input(s): GLUCAP in the last 72 hours.  Wt Readings from Last 3 Encounters:  12/15/17 79 kg (174 lb 2.6 oz)  12/07/17 81.4 kg (179 lb 7.3 oz)  12/03/17 79.8 kg (176 lb)    Physical Exam:  Constitutional: He isoriented to person, place, and time.Mood is a bit flat but appropriate HENT:  Head:Normocephalic.  Eyes:EOMI Neck:Normal range of motion.Neck supple.No thyromegalyprese during movement Nt.  Cardiovascular: RRR without murmur. No JVD          Respiratory: CTA Bilaterally without wheezes or rales. Normal effort   ZO:XWRU.Bowel sounds are normal. He exhibitsno distension Skin.  Resolving back rash Musc: Left shoulder tender to abduction as well as basic rotation.  He has some pain over the left AC joint.  Right knee with slight pain in passive and active range of motion. Neurological: He is  oriented to person, place, and time.   3 out of 5 right deltoid biceps triceps. Wrist essentially 2+ to 3- out of 5. Hand is similar--- mAS 1+-2 out of 4. Right lower extremity grossly 3 to 3+ out of 5 proximal distal with difficulty and motor sequencing. mAS 1+ to 2 out of 4lft upper extremity.  Right arm remains sensitive to touch today. TONE UNCHANGED TODAY. LUE: Grossly 3+  to 4- out of 5 proximal to distal with better initiation of movements and awareness of limbs. Left lower extremity grossly  4 out of 5 proximal to distal.   Psych: Pleasant and engaging      Assessment/Plan: 1.  Functional deficits secondary to intracranial hemorrhage which require 3+ hours per day of interdisciplinary therapy in a comprehensive inpatient rehab setting. Physiatrist is providing close team supervision and 24 hour management of active medical problems listed below. Physiatrist and rehab team continue to assess barriers to discharge/monitor patient progress toward functional and medical goals.  Function:  Bathing Bathing position   Position: Shower  Bathing parts Body parts bathed by patient: Right arm, Chest, Abdomen, Right upper leg, Left upper leg Body parts bathed by helper: Left arm, Front perineal area, Buttocks, Right lower leg, Left lower leg, Back  Bathing assist Assist Level: 2 helpers(+2 A to wash buttocks while OT rolls pt )      Upper Body Dressing/Undressing Upper body dressing   What is the patient wearing?: Pull over shirt/dress     Pull over shirt/dress - Perfomed by patient: Thread/unthread left sleeve Pull over shirt/dress - Perfomed by helper: Thread/unthread right sleeve, Put head through opening, Pull shirt over trunk        Upper body assist Assist Level: Touching or steadying assistance(Pt > 75%)      Lower Body Dressing/Undressing Lower body dressing   What is the patient wearing?: Underwear, Pants, Felicity Pellegrini  Hose, Shoes   Underwear - Performed by helper: Thread/unthread right underwear leg, Thread/unthread left underwear leg   Pants- Performed by helper: Thread/unthread right pants leg, Thread/unthread left pants leg, Pull pants up/down   Non-skid slipper socks- Performed by helper: Don/doff right sock, Don/doff left sock   Socks - Performed by helper: Don/doff right sock, Don/doff left sock   Shoes - Performed by helper: Don/doff  right shoe, Don/doff left shoe, Fasten left, Fasten right       TED Hose - Performed by helper: Don/doff right TED hose, Don/doff left TED hose  Lower body assist Assist for lower body dressing: 2 Helpers      Financial traderToileting Toileting Toileting activity did not occur: No continent bowel/bladder event   Toileting steps completed by helper: Adjust clothing prior to toileting, Performs perineal hygiene, Adjust clothing after toileting    Toileting assist Assist level: Two helpers   Transfers Chair/bed transfer Chair/bed transfer activity did not occur: Safety/medical concerns Chair/bed transfer method: Squat pivot Chair/bed transfer assist level: 2 helpers Chair/bed transfer assistive device: Armrests     Locomotion Ambulation Ambulation activity did not occur: Safety/medical concerns   Max distance: 10' Assist level: Moderate assist (Pt 50 - 74%)   Wheelchair Wheelchair activity did not occur: Safety/medical concerns Type: Manual Max wheelchair distance: 10 ft Assist Level: Touching or steadying assistance (Pt > 75%)(B LEs)  Cognition Comprehension Comprehension assist level: Understands complex 90% of the time/cues 10% of the time  Expression Expression assist level: Expresses basic 90% of the time/requires cueing < 10% of the time.  Social Interaction Social Interaction assist level: Interacts appropriately 90% of the time - Needs monitoring or encouragement for participation or interaction.  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   2. DVT Prophylaxis/Anticoagulation: SCDs. Monitor for any signs of DVT 3. 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 Ice to right knee    -Injected right knee 1/22 with improvement.         - baclofen to 20 mg 4 times daily   -add gabapentin for dysesthesias 100mg  bid 4. Mood:Provide emotional support 5. Neuropsych: This patientiscapable of making decisions on hisown behalf. 6. Skin/Wound Care:    -Continue hydrocortisone cream Benadryl and hypoallergenic sheets   -Diflucan stopped    -Area  improved 7. Fluids/Electrolytes/Nutrition:Continue to encourage p.o. 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 EEG 11/10/2017 negative. Initially on Keppra changed to Depakote 500 mg twice a day 01/15/2019as well as Vimpat 100 mg twice a day   -  seems to have  adjusted to medications  9.Hypertension. No current antihypertensive medication. Monitor with increased mobility 10.COPD with history of tobacco abuse in the past. Continue Dulera   LOS (Days) 11 A FACE TO FACE EVALUATION WAS PERFORMED  Ranelle OysterSWARTZ,ZACHARY T, MD 12/21/2017 8:58 AM

## 2017-12-22 ENCOUNTER — Encounter: Payer: Medicare HMO | Admitting: Occupational Therapy

## 2017-12-22 ENCOUNTER — Inpatient Hospital Stay (HOSPITAL_COMMUNITY): Payer: Medicare HMO

## 2017-12-22 ENCOUNTER — Ambulatory Visit: Payer: Medicare HMO | Admitting: Physical Therapy

## 2017-12-22 ENCOUNTER — Inpatient Hospital Stay (HOSPITAL_COMMUNITY): Payer: Medicare HMO | Admitting: Physical Therapy

## 2017-12-22 ENCOUNTER — Inpatient Hospital Stay (HOSPITAL_COMMUNITY): Payer: Medicare HMO | Admitting: Speech Pathology

## 2017-12-22 ENCOUNTER — Ambulatory Visit: Payer: Self-pay | Admitting: Neurology

## 2017-12-22 NOTE — Progress Notes (Signed)
[place] PHYSICAL MEDICINE & REHABILITATION     PROGRESS NOTE    Subjective/Complaints: Had a spell last night where heart rate was elevated and apparently he became anxious too.  States that he feels fine this morning.  Was a bit slower to process yesterday therapy  ROS: pt denies nausea, vomiting, diarrhea, cough, shortness of breath or chest pain   Objective: Vital Signs: Blood pressure 124/68, pulse 82, temperature 98 F (36.7 C), temperature source Oral, resp. rate 18, height 6' (1.829 m), weight 74.7 kg (164 lb 10.9 oz), SpO2 95 %. No results found. No results for input(s): WBC, HGB, HCT, PLT in the last 72 hours. No results for input(s): NA, K, CL, GLUCOSE, BUN, CREATININE, CALCIUM in the last 72 hours.  Invalid input(s): CO CBG (last 3)  No results for input(s): GLUCAP in the last 72 hours.  Wt Readings from Last 3 Encounters:  12/22/17 74.7 kg (164 lb 10.9 oz)  12/07/17 81.4 kg (179 lb 7.3 oz)  12/03/17 79.8 kg (176 lb)    Physical Exam:  Constitutional: He isoriented to person, place, and time.Mood is a bit flat but appropriate HENT:  Head:Normocephalic.  Eyes:EOMI Neck:Normal range of motion.Neck supple.No thyromegalyprese during movement Nt.  Cardiovascular: RRR without murmur. No JVD           Respiratory: CTA Bilaterally without wheezes or rales. Normal effort    ZO:XWRUGI:Soft.Bowel sounds are normal. He exhibitsno distension Skin.  Resolving back rash Musc: Mild left AC joint tenderness.  Right knee tenderness. Neurological: He is  oriented to person, place, and time.   3 out of 5 right deltoid biceps triceps. Wrist essentially 2+ to 3- out of 5. Hand is similar--- mAS 1+-2 out of 4. Right lower extremity grossly 3 to 3+ out of 5 proximal distal with difficulty and motor sequencing. mAS 1+ to 2 out of 4lft upper extremity.  Seemed better able to handle passive range of motion this morning however.  Movements and awareness of limbs. Left lower  extremity grossly  4 out of 5 proximal to distal.   Psych: Pleasant and engaging      Assessment/Plan: 1.  Functional deficits secondary to intracranial hemorrhage which require 3+ hours per day of interdisciplinary therapy in a comprehensive inpatient rehab setting. Physiatrist is providing close team supervision and 24 hour management of active medical problems listed below. Physiatrist and rehab team continue to assess barriers to discharge/monitor patient progress toward functional and medical goals.  Function:  Bathing Bathing position Bathing activity did not occur: N/A Position: Shower  Bathing parts Body parts bathed by patient: Right arm, Chest, Abdomen, Right upper leg, Left upper leg Body parts bathed by helper: Left arm, Front perineal area, Buttocks, Right lower leg, Left lower leg, Back  Bathing assist Assist Level: 2 helpers(+2 A to wash buttocks while OT rolls pt )      Upper Body Dressing/Undressing Upper body dressing Upper body dressing/undressing activity did not occur: N/A What is the patient wearing?: Pull over shirt/dress     Pull over shirt/dress - Perfomed by patient: Thread/unthread left sleeve Pull over shirt/dress - Perfomed by helper: Thread/unthread right sleeve, Put head through opening, Pull shirt over trunk        Upper body assist Assist Level: Touching or steadying assistance(Pt > 75%)      Lower Body Dressing/Undressing Lower body dressing Lower body dressing/undressing activity did not occur: N/A What is the patient wearing?: Underwear, Pants, American Family Insuranceed Hose, Shoes   Underwear -  Performed by helper: Thread/unthread right underwear leg, Thread/unthread left underwear leg   Pants- Performed by helper: Thread/unthread right pants leg, Thread/unthread left pants leg, Pull pants up/down   Non-skid slipper socks- Performed by helper: Don/doff right sock, Don/doff left sock   Socks - Performed by helper: Don/doff right sock, Don/doff left sock    Shoes - Performed by helper: Don/doff right shoe, Don/doff left shoe, Fasten left, Fasten right       TED Hose - Performed by helper: Don/doff right TED hose, Don/doff left TED hose  Lower body assist Assist for lower body dressing: 2 Helpers      Toileting Toileting Toileting activity did not occur: No continent bowel/bladder event   Toileting steps completed by helper: Adjust clothing prior to toileting, Performs perineal hygiene, Adjust clothing after toileting    Toileting assist Assist level: Two helpers   Transfers Chair/bed transfer Chair/bed transfer activity did not occur: Safety/medical concerns Chair/bed transfer method: Squat pivot(3 musketeers ) Chair/bed transfer assist level: 2 helpers Chair/bed transfer assistive device: Armrests     Locomotion Ambulation Ambulation activity did not occur: Safety/medical concerns   Max distance: 10' Assist level: Moderate assist (Pt 50 - 74%)   Wheelchair Wheelchair activity did not occur: Safety/medical concerns Type: Manual Max wheelchair distance: 10 ft Assist Level: Touching or steadying assistance (Pt > 75%)(B LEs)  Cognition Comprehension Comprehension assist level: Understands basic 75 - 89% of the time/ requires cueing 10 - 24% of the time  Expression Expression assist level: Expresses basic 90% of the time/requires cueing < 10% of the time.  Social Interaction Social Interaction assist level: Interacts appropriately 90% of the time - Needs monitoring or encouragement for participation or interaction.  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   2. DVT Prophylaxis/Anticoagulation: SCDs. Monitor for any signs of DVT 3. 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 Ice to right knee    -Injected right knee 1/22 with improvement.         - baclofen to 20 mg 4 times daily   -added gabapentin for dysesthesias 100mg  bid   -Would like to continue with current changes as patient has significant tone as well as dysesthesias that impact his tolerance of activity.  Hopefully he will become more tolerant of medications over the next day or 2 4. Mood:Provide emotional support 5. Neuropsych: This patientiscapable of making decisions on hisown behalf. 6. Skin/Wound Care:    -Continue hydrocortisone cream Benadryl and hypoallergenic sheets   -Diflucan stopped    -Area  improved 7. 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 EEG 11/10/2017 negative. Initially on Keppra changed to Depakote 500 mg  twice a day 01/15/2019as well as Vimpat 100 mg twice a day   -  seems to have  adjusted to medications  9.Hypertension. No current antihypertensive medication. Monitor with increased mobility 10.COPD with history of tobacco abuse in the past. Continue Dulera   LOS (Days) 12 A FACE TO FACE EVALUATION WAS PERFORMED  Faith Rogue T, MD 12/22/2017 9:08 AM

## 2017-12-22 NOTE — Progress Notes (Signed)
Speech Language Pathology Daily Session Note  Patient Details  Name: Jaime Pierce MRN: [id number] Date of Birth: [birth year]  Today's Date: 12/22/2017 SLP Individual Time: 1430-1455 SLP Individual Time Calculation (min): 25 min  Short Term Goals: Week 2: SLP Short Term Goal 1 (Week 2): Patient will demonstrate selective attention to a task in a moderately distracting enviornment for ~45 Minutes with supervision verbal cues for redirection.  SLP Short Term Goal 2 (Week 2): Patient will demonstrate functional problem solving for mildly complex tasks with Min A verbal cues.  SLP Short Term Goal 3 (Week 2): Patient will recall new and functional information with Min A verbal cues.  SLP Short Term Goal 4 (Week 2): Patient will self-monitor and correct errors during tasks with Mod A verbal cues.   Skilled Therapeutic Interventions: Skilled treatment session focused on cognitive goals. SLP facilitated session by providing Total A verbal cues for recall of procedures to a previously learned task (yesterday's session - Call-It). Patient was unable to attend to task and required extra time to follow basic commands due to lethargy and discomfort in the wheelchair. OT adjusted seating; however, patient was still uncomfortable. SLP and PT transferred patient back to bed. PA aware of lethargy. Pt left supine in bed with all needs within reach, wife and daughter present. Continue with current plan of care.    Function:  Cognition Comprehension Comprehension assist level: Understands basic 50 - 74% of the time/ requires cueing 25 - 49% of the time  Expression   Expression assist level: Expresses basic 75 - 89% of the time/requires cueing 10 - 24% of the time. Needs helper to occlude trach/needs to repeat words.  Social Interaction Social Interaction assist level: Interacts appropriately 50 - 74% of the time - May be physically or verbally inappropriate.  Problem Solving Problem solving assist level:  Solves basic 25 - 49% of the time - needs direction more than half the time to initiate, plan or complete simple activities  Memory Memory assist level: Recognizes or recalls 25 - 49% of the time/requires cueing 50 - 75% of the time    Pain Pain Assessment Pain Assessment: No/denies pain Discomfort in chair, patient repositioned  Therapy/Group: Individual Therapy  Roxan HockeyYahui Meilyn Heindl  SLP - Student 12/22/2017, 4:01 PM

## 2017-12-22 NOTE — Progress Notes (Signed)
Physical Therapy Session Note  Patient Details  Name: Jaime Pierce MRN: [id number] Date of Birth: 21-Aug-1946  Today's Date: 12/22/2017 PT Individual Time: 1100-1200 PT Individual Time Calculation (min): 60 min   Skilled Therapeutic Interventions/Progress Updates:    Pt greeted siiting upright [facility] in room, wife present throughout session, agreeable to therapy. Pt transported dependently to day room, utilizing standing frame to emphasize equal WBing through BLE, placed dynadisc under L foot and yoga block under L forearm to dissuade from pushing however pt demonstrates rotating L hip back causing R hip IR. On flat surface, pelvic rotation not as apparent, pt able to rotate hips back to neutral with verbal and tactile cues. Engaging pt in reaching task to L side, pt continues to exhibit decreased tolerance to leaning trunk past midline to L, frequent redirection of pt to task required. Pt transported to therapy gym, squat pivot transfer max A +2. Sitting on edge of mat with bolster between feet and knees, engaged pt in reaching task with LUE on therapy ball on mat. Pt demonstrates improved reaching and lengthening of R trunk after repeat trials. Performed sit<>stand from mat with bolster between legs with max A +2, verbal, tactile, and visual cues in mirror to assume upright posture. Performed reaching task as above with therapy ball on table to L side, pt demonstrates L pelvic rotation with decreased ability to self-correct. At end of session, pt's wife returned pt to room sitting in [facility], denies any needs at this time.   Therapy Documentation Precautions:  Precautions Precautions: Fall Precaution Comments: impaired procioceptive awareness bil.  Restrictions Weight Bearing Restrictions: No   Pain: Pain Assessment Pain Assessment: No/denies pain  See Function Navigator for Current Functional Status.   Therapy/Group: Individual Therapy  Theodosia QuayMorgan Siah Steely 12/22/2017, 4:18 PM

## 2017-12-22 NOTE — Progress Notes (Signed)
Per Deatra Inaan Angiulli, PA hold 1/30 1600 and 2000, and 1/31 0800 doses of Baclofen until after MD rounds tomorrow 1/31. Pt wife and daughter informed of changes and instructed on reason for holding. Will be reported to oncoming shift during shift change report.

## 2017-12-22 NOTE — Progress Notes (Signed)
Occupational Therapy Note  Patient Details  Name: Jaime Pierce MRN: [id number] Date of Birth: December 09, 1945  Today's Date: 12/22/2017 OT Individual Time: 1400-1430 OT Individual Time Calculation (min): 30 min   Pt denies pain Individual Therapy  Pt resting in bed upon arrival but agreeable to therapy.  Pt required max A for supine>sit EOB in preparation for transfer to w/c.  Pt required min A for sitting balance while shoes were donned.  Attempted squat pivot transfer to w/c X 2 but pt consistently exhibited significant posterior lean when initiating movement.  Pt required max A +2 for sit<>stand with Stedy X 2 and transfer to w/c.  Pt continues to exhibit LUE ratchety movements and inability to touch the top of his head.  Pt was able to wash his hair with LUE on 1/24.  Transitioned to focus on LUE NMR for relaxation.  PA notified of change in function.   Jaime Pierce, Jaime Pierce [name] 12/22/2017, 3:00 PM

## 2017-12-22 NOTE — Progress Notes (Signed)
Occupational Therapy Session Note  Patient Details  Name: Jaime Pierce MRN: [id number] Date of Birth: [birth year]  Today's Date: 12/22/2017 OT Individual Time: 6019-4786 OT Individual Time Calculation (min): 75 min    Short Term Goals: Week 1:  OT Short Term Goal 1 (Week 1): Pt will sit to stand in stedy with MAX A of 1 in prep for functional transfers OT Short Term Goal 1 - Progress (Week 1): Met OT Short Term Goal 2 (Week 1): Pt will maintain dynamic sitting balance with no more than CGA in prep for LB dressing OT Short Term Goal 2 - Progress (Week 1): Met OT Short Term Goal 3 (Week 1): Pt will recall hemi techniques with min VC OT Short Term Goal 3 - Progress (Week 1): Met OT Short Term Goal 4 (Week 1): Pt will groom at sink with MOD A OT Short Term Goal 4 - Progress (Week 1): Met  Skilled Therapeutic Interventions/Progress Updates:    OT intervention with focus on BADL retraining including bathing at shower level and dressing with sit<>stand in [place].  Pt transferred to shower with Stedy and initiated bathing tasks with LUE. Pt unable to wash hair this morning secondary to pt unable to limited shoulder flexion and ratchety movements.  Pt required mod a for bathing tasks with sit<>stand in Stedy to bathe buttocks.  Pt required tot A for dressing tasks with sit<>stand in [place].  Pt was able to initiate hemi dressing techniques but required assistance to complete task.  Pt remained in w/c with wife present and all needs within reach.   Therapy Documentation Precautions:  Precautions Precautions: Fall Precaution Comments: impaired procioceptive awareness bil.  Restrictions Weight Bearing Restrictions: No Pain: Pain Assessment Pain Assessment: No/denies pain  See Function Navigator for Current Functional Status.   Therapy/Group: Individual Therapy  [name] 12/22/2017, 12:17 PM

## 2017-12-22 NOTE — Patient Care Conference (Signed)
Inpatient RehabilitationTeam Conference and Plan of Care Update Date: 12/21/2017   Time: 2:10 PM    Patient Name: Jaime Pierce      Medical Record Number: [id number]  Date of Birth: 05/11/1946 Sex: Male         Room/Bed: 4W10C/4W10C-01 Payor Info: Payor: [employer] MED[employer]ARE / Plan: [employer] MEDICARE HMO/PPO / Product Type: *No Product type* /    Admitting Diagnosis: Stroke  Admit Date/Time:  12/10/2017  3:03 PM Admission Comments: No comment available   Primary Diagnosis:  Cerebral amyloid angiopathy (CODE) Principal Problem: Cerebral amyloid angiopathy (CODE)  Patient Active Problem List   Diagnosis Date Noted  . Intracerebral hemorrhage 12/10/2017  . Cerebral amyloid angiopathy (CODE) 12/09/2017  . Seizures (HCC) 12/09/2017  . ICH (intracerebral hemorrhage) (HCC) 12/06/2017  . Partial seizure (HCC) 11/08/2017  . Positive colorectal cancer screening using Cologuard test 10/22/2017  . Hemiparesis affecting right side as late effect of cerebrovascular accident (CVA) (HCC)   . Cough   . 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 Pain   . Hyponatremia   . Benign essential HTN   . 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 Gait disturbance, post-stroke   . Aphasia as late effect of stroke   . SAH (subarachnoid hemorrhage) (HCC) 09/02/2017  . B12 deficiency 09/02/2017  . Well adult exam 06/16/2016  . Cigarette smoker 02/23/2016  . COPD GOLD II  12/24/2014  . CAD (coronary artery disease) 07/22/2011  . HTN (hypertension) 07/22/2011  . Dyslipidemia 07/22/2011  . Smoker 07/22/2011    Expected Discharge Date: Expected Discharge Date: 01/08/18  Team Members Present: Physician leading conference: Dr. Faith Rogue Social Worker Present: Amada Jupiter, LCSW Nurse Present: Regino Schultze, RN PT Present: Alyson Reedy, PT OT Present: Ardis Rowan, COTA;Jennifer Katrinka Blazing, OT SLP Present: Colin Benton,  SLP PPS Coordinator present : Tora Duck, RN, CRRN     Current Status/Progress Goal Weekly Team Focus  Medical   Patient's's rash is improving.  Is developing more tone in the right arm and leg.  Knee injection performed last week with some improvement  Improve functional use of right arm and leg  Spasticity management, pain control,   Bowel/Bladder   Xontinent of bladdwe/bowel, voiding w/o difficulties. LBM 12/19/2017  remain continent of bowel and bladder min assist  Continue to educate r/t s/s of constipation and bladder infection has available prn orders for constipation, po encouraged,   Swallow/Nutrition/ Hydration             ADL's   bathing-,max A; dressing-max A; sitting balance-supervsiion; slide board transfers-mod A; squat pivot transfers-mod/max A  bathing-min A; UB dressing-min A; LB dressing-mod A; tub transfers-mod A; toilet transfers-min A; toiletintg-mod A  activity tolerance, sitting balance, functional transfers; BADL retraining, funcitonal use of LUE   Mobility   modA bed mobility with HOB elevated, mod/max A +1 sit<>stand, maxA +2 gait short distances   modA bed mobility, transfers and gait short distances; minA w/c mobility  postural control, gait/transfer training, endurance   Communication             Safety/Cognition/ Behavioral Observations  Min A to Mod A   Min A   problem solving, memory, selective attention   Pain   Intermittent pain right knee and left shoulder, continue Voltran cream, prn meds  and repositioning   < 4 score mod assistance   Assess qs and pen , educate and evaluate effectiveness of  treatment    Skin   Upper/lower rash to body areas slight improvement ,   No new skin intergity issues or breakdown  Assess QS and prn , monitor effectiveness of current Rx and resolving of rash, Notify MD/PA if no improvemnt of increase discomfort     Rehab Goals Patient on target to meet rehab goals: Yes *See Care Plan and progress notes for long and  short-term goals.     Barriers to Discharge  Current Status/Progress Possible Resolutions Date Resolved   Physician    Medical stability        Adaptive equipment and medical management as above      Nursing                  PT                    OT                  SLP                SW                Discharge Planning/Teaching Needs:  Plan for pt to d/c home with wife providing 24/7 assistance.    Wife attends most therapy sessions and actively participates.   Team Discussion:  MD and txs report increased tone - may need botox as OP.  ?neuropathic pain causing this - neurontin started.  Has been inconsistent in function with PT.  Now pushing with left side and having difficulty with motor planning.  Currently mod-max with gait.  Mod assist w/c level goals - concern if wife will feel confident to provide this.  ST reports min- mod assist with basic problem solving and attention.  SW to monitor any concerns from pt/wife about home d/c.  Revisions to Treatment Plan:  None    Continued Need for Acute Rehabilitation Level of Care: The patient requires daily medical management by a physician with specialized training in physical medicine and rehabilitation for the following conditions: Daily direction of a multidisciplinary physical rehabilitation program to ensure safe treatment while eliciting the highest outcome that is of practical value to the patient.: Yes Daily medical management of patient stability for increased activity during participation in an intensive rehabilitation regime.: Yes Daily analysis of laboratory values and/or radiology reports with any subsequent need for medication adjustment of medical intervention for : Neurological problems  Alton Tremblay 12/22/2017, 1:19 PM

## 2017-12-23 ENCOUNTER — Inpatient Hospital Stay (HOSPITAL_COMMUNITY): Payer: Medicare HMO

## 2017-12-23 ENCOUNTER — Inpatient Hospital Stay (HOSPITAL_COMMUNITY): Payer: Medicare HMO | Admitting: Speech Pathology

## 2017-12-23 ENCOUNTER — Encounter: Payer: Medicare HMO | Admitting: Occupational Therapy

## 2017-12-23 ENCOUNTER — Encounter: Payer: Self-pay | Admitting: Internal Medicine

## 2017-12-23 ENCOUNTER — Ambulatory Visit: Payer: Medicare HMO

## 2017-12-23 LAB — BASIC METABOLIC PANEL
ANION GAP: 11 (ref 5–15)
BUN: 16 mg/dL (ref 6–20)
CALCIUM: 9 mg/dL (ref 8.9–10.3)
CO2: 24 mmol/L (ref 22–32)
CREATININE: 0.77 mg/dL (ref 0.61–1.24)
Chloride: 99 mmol/L — ABNORMAL LOW (ref 101–111)
GFR calc non Af Amer: >60 mL/min (ref >60)
Glucose, Bld: 137 mg/dL — ABNORMAL HIGH (ref 65–99)
Potassium: 4 mmol/L (ref 3.5–5.1)
SODIUM: 134 mmol/L — AB (ref 135–145)

## 2017-12-23 LAB — CBC
HCT: 38.9 % — ABNORMAL LOW (ref 39.0–52.0)
HEMOGLOBIN: 12.9 g/dL — AB (ref 13.0–17.0)
MCH: 30.4 pg (ref 26.0–34.0)
MCHC: 33.2 g/dL (ref 30.0–36.0)
MCV: 91.7 fL (ref 78.0–100.0)
PLATELETS: 363 10*3/uL (ref 150–400)
RBC: 4.24 MIL/uL (ref 4.22–5.81)
RDW: 14.6 % (ref 11.5–15.5)
WBC: 9.5 10*3/uL (ref 4.0–10.5)

## 2017-12-23 MED ORDER — BACLOFEN 20 MG PO TABS
20.0000 mg | ORAL_TABLET | Freq: Once | ORAL | Status: AC
  Administered 2017-12-23: 20 mg via ORAL
  Filled 2017-12-23: qty 1

## 2017-12-23 MED ORDER — GABAPENTIN 100 MG PO CAPS
100.0000 mg | ORAL_CAPSULE | Freq: Every day | ORAL | Status: DC
  Administered 2017-12-23 – 2017-12-31 (×9): 100 mg via ORAL
  Filled 2017-12-23 (×10): qty 1

## 2017-12-23 MED ORDER — BACLOFEN 20 MG PO TABS
20.0000 mg | ORAL_TABLET | Freq: Three times a day (TID) | ORAL | Status: DC
  Administered 2017-12-23 – 2017-12-31 (×26): 20 mg via ORAL
  Filled 2017-12-23 (×27): qty 1

## 2017-12-23 NOTE — Progress Notes (Signed)
Social Work Patient ID: Jaime Pierce, male   DOB: Oct 24, 1946, 72 y.o.   MRN: [id number]   Have reviewed team conference with pt and wife who are aware that team continues to aim for 2/16 d/c date with mod assist w/c goals.  Pt admits he is frustrated with feeling like he is "teetering back and forth" with any progress.  His wife had privately expressed concern about his frustration and mood - may benefit from ongoing neuropsychology support with this difficult medical prognosis.  Continue to follow.  Jaime Marasigan, LCSW

## 2017-12-23 NOTE — Progress Notes (Addendum)
Physical Therapy Session Note  Patient Details  Name: Jaime Pierce MRN: [id number] Date of Birth: 1946-08-18  Today's Date: 12/23/2017 PT Individual Time: 1410-1530 PT Individual Time Calculation (min): 80 min   Short Term Goals: Week 1:  PT Short Term Goal 1 (Week 1): Pt will perform bed mobility modA supine<>sit from flat bed PT Short Term Goal 1 - Progress (Week 1): Updated due to goal met PT Short Term Goal 2 (Week 1): Pt will perform lateral scoot transfers consistent modA PT Short Term Goal 2 - Progress (Week 1): Progressing toward goal PT Short Term Goal 3 (Week 1): Pt will perform sit <>stand maxA +1 PT Short Term Goal 3 - Progress (Week 1): Progressing toward goal PT Short Term Goal 4 (Week 1): Pt will demonstrate OOB tolerance >4hours/day outside of therapy session PT Short Term Goal 4 - Progress (Week 1): Met Week 2:  PT Short Term Goal 1 (Week 2): Pt will ambulate 25 feet using LRAD mod A +1       This note completed with input from  Jaime Pierce, Jaime Pierce.  Skilled Therapeutic Interventions/Progress Updates: Bed features utilized to work on log rolling and supine <> sitting EOB requiring mod A. Sit<>stand conducted in the [name] +2. Pt required cueing to extend the hips and knees to which not full extension of either were gained, however trunk extension was sustained for a short period of time with tactile and verbal cueing. PT conducted neuro re-ed via utilization of the mirror for visual feedback to gain midline orientation in standing and conduct weight shifting x 15 reps with tactile cueing. Seated on a firm surface for lateral weight shifting onto elbows was utilized to decrease R UE tone, lengthen L side of the trunk, and shorten R side of the trunk. Pt required tactile cueing and did not demonstrate head righting while leaning. Reaching activities were conducted in the sagittal plane for neuro re-ed of the trunk stabilizers with mod A to weight shift out of BOS. Manual  stretching of the L trunk was utilized to increase muscle length on the L side of the trunk in order to help regain midline orientation. Pt conducted static standing with mod-max A x 2 via pt arms around PTs back sit<>stand from plinth with chair in front for support 4 sets x 30". Pt required multimodal cueing to stand erect, with weight shifting in standing which decreased tone. Upon initial standing pt demonstrated flexor withdrawal on the R LE. Gait training 8' on level tile with RW and max A x 2 and manual cueing/placement of the R foot to avoid scissoring gait and decreased step length.    Pt was left in w/c beside bed with call bell and phone within reach. Wife was in the room and aware of safety precautions should she need to step out of the room.     Therapy Documentation Precautions:  Precautions Precautions: Fall Precaution Comments: impaired procioceptive awareness bil.  Restrictions Weight Bearing Restrictions: No Pain: none per pt      See Function Navigator for Current Functional Status.  Therapy/Group: Individual Therapy  Jaime Pierce 12/23/2017, 4:49 PM

## 2017-12-23 NOTE — Progress Notes (Signed)
[facility] PHYSICAL [facility]ICINE & REHABILITATION     PROGRESS NOTE    Subjective/Complaints: Patient reports some flushing and therapy notes that he was not as productive in therapy as he has been earlier in the week.  Able to sleep last night.  ROS: pt denies nausea, vomiting, diarrhea, cough, shortness of breath or chest pain    Objective: Vital Signs: Blood pressure 107/76, pulse 73, temperature 97.7 F (36.5 C), temperature source Oral, resp. rate 18, height 6' (1.829 m), weight 74.7 kg (164 lb 10.9 oz), SpO2 94 %. No results found. Recent Labs    12/23/17 0814  WBC 9.5  HGB 12.9*  HCT 38.9*  PLT 363   No results for input(s): NA, K, CL, GLUCOSE, BUN, CREATININE, CALCIUM in the last 72 hours.  Invalid input(s): CO CBG (last 3)  No results for input(s): GLUCAP in the last 72 hours.  Wt Readings from Last 3 Encounters:  12/22/17 74.7 kg (164 lb 10.9 oz)  12/07/17 81.4 kg (179 lb 7.3 oz)  12/03/17 79.8 kg (176 lb)    Physical Exam:  Constitutional: He isoriented to person, place, and time.Mood is a bit flat but appropriate HENT:  Head:Normocephalic.  Eyes:EOMI Neck:Normal range of motion.Neck supple.No thyromegalyprese during movement Nt.  Cardiovascular: RRR without murmur. No JVD            Respiratory: CTA Bilaterally without wheezes or rales. Normal effort    ZO:XWRU.Bowel sounds are normal. He exhibitsno distension Skin.  Resolving back rash Musc: Mild left AC joint tenderness.  Right knee tenderness. Neurological: He is  oriented to person, place, and time.   3 out of 5 right deltoid biceps triceps. Wrist essentially 2+ to 3- out of 5. Hand is similar--- mAS  -2 out of 4 biceps and shoulder Right lower extremity grossly 3 to 3+ out of 5 proximal distal with difficulty and motor sequencing. mAS  o 2 out of 4right hamstrings.    Movements and awareness of left side better. Left lower extremity grossly  4 out of 5 proximal to distal.   Psych:  Pleasant and engaging      Assessment/Plan: 1.  Functional deficits secondary to intracranial hemorrhage which require 3+ hours per day of interdisciplinary therapy in a comprehensive inpatient rehab setting. Physiatrist is providing close team supervision and 24 hour management of active medical problems listed below. Physiatrist and rehab team continue to assess barriers to discharge/monitor patient progress toward functional and medical goals.  Function:  Bathing Bathing position Bathing activity did not occur: N/A Position: Shower  Bathing parts Body parts bathed by patient: Right arm, Chest, Abdomen, Right upper leg, Left upper leg, Front perineal area Body parts bathed by helper: Left arm, Buttocks, Right lower leg, Left lower leg  Bathing assist Assist Level: 2 helpers(+2 A to wash buttocks while OT rolls pt )      Upper Body Dressing/Undressing Upper body dressing Upper body dressing/undressing activity did not occur: N/A What is the patient wearing?: Pull over shirt/dress     Pull over shirt/dress - Perfomed by patient: Thread/unthread left sleeve Pull over shirt/dress - Perfomed by helper: Thread/unthread right sleeve, Put head through opening, Pull shirt over trunk        Upper body assist Assist Level: Touching or steadying assistance(Pt > 75%)      Lower Body Dressing/Undressing Lower body dressing Lower body dressing/undressing activity did not occur: N/A What is the patient wearing?: Underwear, Pants, [employer], Shoes   Underwear - Performed  by helper: Thread/unthread right underwear leg, Thread/unthread left underwear leg   Pants- Performed by helper: Thread/unthread right pants leg, Thread/unthread left pants leg, Pull pants up/down   Non-skid slipper socks- Performed by helper: Don/doff right sock, Don/doff left sock   Socks - Performed by helper: Don/doff right sock, Don/doff left sock   Shoes - Performed by helper: Don/doff right shoe, Don/doff left  shoe, Fasten left, Fasten right       TED Hose - Performed by helper: Don/doff right TED hose, Don/doff left TED hose  Lower body assist Assist for lower body dressing: 2 Helpers      Toileting Toileting Toileting activity did not occur: No continent bowel/bladder event   Toileting steps completed by helper: Adjust clothing prior to toileting, Performs perineal hygiene, Adjust clothing after toileting    Toileting assist Assist level: Two helpers   Transfers Chair/bed transfer Chair/bed transfer activity did not occur: Safety/medical concerns Chair/bed transfer method: Squat pivot Chair/bed transfer assist level: 2 helpers Chair/bed transfer assistive device: Armrests     Locomotion Ambulation Ambulation activity did not occur: Safety/medical concerns   Max distance: 10' Assist level: Moderate assist (Pt 50 - 74%)   Wheelchair Wheelchair activity did not occur: Safety/medical concerns Type: Manual Max wheelchair distance: 10 ft Assist Level: Touching or steadying assistance (Pt > 75%)(B LEs)  Cognition Comprehension Comprehension assist level: Understands basic 50 - 74% of the time/ requires cueing 25 - 49% of the time  Expression Expression assist level: Expresses basic 75 - 89% of the time/requires cueing 10 - 24% of the time. Needs helper to occlude trach/needs to repeat words.  Social Interaction Social Interaction assist level: Interacts appropriately 50 - 74% of the time - May be physically or verbally inappropriate.  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   2. DVT Prophylaxis/Anticoagulation: SCDs. Monitor for any signs of DVT 3. 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 Ice to right knee    -Injected right knee 1/22 with improvement.          -I suspect some of the issues with flushing in decreased therapy performance likely her related to increase in medications.  Therefore we will decrease gabapentin 200 mg at bedtime and reduce his baclofen back to his baseline dose of 20 mg 3 times daily 4. Mood:Provide emotional support 5. Neuropsych: This patientiscapable of making decisions on hisown behalf. 6. Skin/Wound Care:    -Continue hydrocortisone cream Benadryl and hypoallergenic sheets   -Diflucan stopped    -Area  improved 7. Fluids/Electrolytes/Nutrition:Labs today are pending. 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 EEG 11/10/2017 negative. Initially on Keppra changed to Depakote 500 mg twice a day 01/15/2019as well as Vimpat 100 mg twice a day   -  seems to have  adjusted to medications. Check depakote  level today (add on)  9.Hypertension. No current antihypertensive medication. Monitor with increased mobility 10.COPD with history of tobacco abuse in the past. Continue Dulera   LOS (Days) 13 A FACE TO FACE EVALUATION WAS PERFORMED  Faith Rogue T, MD 12/23/2017 9:07 AM

## 2017-12-23 NOTE — Progress Notes (Signed)
Speech Language Pathology Daily Session Note  Patient Details  Name: Jaime Pierce A Yellen MRN: [id number] Date of Birth: Jun 27, 1946  Today's Date: 12/23/2017 SLP Individual Time: 1030-1130 SLP Individual Time Calculation (min): 60 min  Short Term Goals: Week 2: SLP Short Term Goal 1 (Week 2): Patient will demonstrate selective attention to a task in a moderately distracting enviornment for ~45 Minutes with supervision verbal cues for redirection.  SLP Short Term Goal 2 (Week 2): Patient will demonstrate functional problem solving for mildly complex tasks with Min A verbal cues.  SLP Short Term Goal 3 (Week 2): Patient will recall new and functional information with Min A verbal cues.  SLP Short Term Goal 4 (Week 2): Patient will self-monitor and correct errors during tasks with Mod A verbal cues.   Skilled Therapeutic Interventions: Skilled treatment session focused on cognitive goals. SLP facilitated session by providing extra time and Mod-Max A verbal cues for problem solving during a deductive reasoning task. Patient demonstrated intellectual awareness in regards to difficulty of task. Patient also sequenced 4 written steps with Min A verbal cues. Patient  left upright in wheelchair with all needs within reach. Continue with current plan of care.      Function:   Cognition Comprehension Comprehension assist level: Understands basic 50 - 74% of the time/ requires cueing 25 - 49% of the time  Expression   Expression assist level: Expresses basic 75 - 89% of the time/requires cueing 10 - 24% of the time. Needs helper to occlude trach/needs to repeat words.  Social Interaction Social Interaction assist level: Interacts appropriately 50 - 74% of the time - May be physically or verbally inappropriate.  Problem Solving Problem solving assist level: Solves basic 25 - 49% of the time - needs direction more than half the time to initiate, plan or complete simple activities  Memory Memory assist  level: Recognizes or recalls 25 - 49% of the time/requires cueing 50 - 75% of the time    Pain No/Denies pain   Therapy/Group: Individual Therapy  Shavar Gorka 12/23/2017, 2:01 PM

## 2017-12-23 NOTE — Progress Notes (Signed)
Occupational Therapy Session Note  Patient Details  Name: Delora FuelRichard A Barthelemy MRN: [id number] Date of Birth: 1946/01/14  Today's Date: 12/23/2017 OT Individual Time: 0900-1000 OT Individual Time Calculation (min): 60 min    Short Term Goals: Week 2:  OT Short Term Goal 1 (Week 2): Pt will don pullover shirt with mod A OT Short Term Goal 2 (Week 2): Pt will perform sit<>stand with mod A in preparation for pulling up pants OT Short Term Goal 3 (Week 2): Pt will perform grooming tasks with min A OT Short Term Goal 4 (Week 2): Pt will perfomr BSC transfer with mod A  Skilled Therapeutic Interventions/Progress Updates:    Pt sitting on Stedy upon arrival with RN and wife present.  Initial focus on dressing tasks.  Pt able to recount hemi dressing techniques but required tot A to complete all dressing tasks.  Pt transitioned to gym and transferred to mat with max a for squat pivot.  Focus on reaching with LUE for objects in various planes. Emphasis on lateral flexion, shifting weight in hips, anterior lean, and flexing LUE up to 90. Pt transferred back to w/c and returned to room with wife.   Therapy Documentation Precautions:  Precautions Precautions: Fall Precaution Comments: impaired procioceptive awareness bil.  Restrictions Weight Bearing Restrictions: No   Pain:  Pt with no c/o of pain but with s/s of pain with BUE flexion>90, repostioned   See Function Navigator for Current Functional Status.   Therapy/Group: Individual Therapy  Rich BraveLanier, Latisia Hilaire Chappell 12/23/2017, 12:07 PM

## 2017-12-24 ENCOUNTER — Ambulatory Visit: Payer: Medicare HMO | Admitting: Physical Therapy

## 2017-12-24 ENCOUNTER — Inpatient Hospital Stay (HOSPITAL_COMMUNITY): Payer: Medicare HMO | Admitting: Speech Pathology

## 2017-12-24 ENCOUNTER — Ambulatory Visit (HOSPITAL_COMMUNITY): Payer: Medicare HMO

## 2017-12-24 ENCOUNTER — Inpatient Hospital Stay (HOSPITAL_COMMUNITY): Payer: Medicare HMO

## 2017-12-24 NOTE — Progress Notes (Signed)
Speech Language Pathology Weekly Progress and Session Note  Patient Details  Name: Jaime Pierce MRN: [id number] Date of Birth: 07/21/1946  Beginning of progress report period: December 17, 2017 End of progress report period: December 24, 2017  Today's Date: 12/24/2017 SLP Individual Time: 1030-1130 SLP Individual Time Calculation (min): 60 min  Short Term Goals: Week 2: SLP Short Term Goal 1 (Week 2): Patient will demonstrate selective attention to a task in a moderately distracting enviornment for ~45 Minutes with supervision verbal cues for redirection.  SLP Short Term Goal 1 - Progress (Week 2): Met SLP Short Term Goal 2 (Week 2): Patient will demonstrate functional problem solving for mildly complex tasks with Min A verbal cues.  SLP Short Term Goal 2 - Progress (Week 2): Not met SLP Short Term Goal 3 (Week 2): Patient will recall new and functional information with Min A verbal cues.  SLP Short Term Goal 3 - Progress (Week 2): Not met SLP Short Term Goal 4 (Week 2): Patient will self-monitor and correct errors during tasks with Mod A verbal cues.  SLP Short Term Goal 4 - Progress (Week 2): Met    New Short Term Goals: Week 3: SLP Short Term Goal 1 (Week 3): Patient will demonstrate functional problem solving for mildly complex tasks with Min A verbal cues.  SLP Short Term Goal 2 (Week 3): Patient will recall new and functional information with Min A verbal cues.  SLP Short Term Goal 3 (Week 3): Patient will self-monitor and correct errors during tasks with Min A verbal cues.  SLP Short Term Goal 4 (Week 3): Patient will demonstrate alternating attention between two functional tasks for ~15 Minutes with Mod A verbal cues.  Weekly Progress Updates: Patient continues to make functional gains and has met 2 out of 4 STGs this reporting period. Currently, pt requires overall Mod A verbal cues for self-monitoring and self-correction of errors and supervision verbal cues for selective  attention to tasks in a moderately distractive environment for ~45 minutes. Pt demonstrates more emergent awareness in regards to left upper extremity. However, patient continues to require Mod A verbal cues for functional problem solving with mildly complex tasks and Max A verbal cues for recall of new and functional information. Patient and family education is ongoing. Patient would benefit from continued skilled SLP intervention to maximize his cognitive function and overall functional independence prior to discharge.    Intensity: Minumum of 1-2 x/day, 30 to 90 minutes Frequency: 3 to 5 out of 7 days Duration/Length of Stay: 01/08/18 Treatment/Interventions: Cognitive remediation/compensation;Cueing hierarchy;Functional tasks;Internal/external aids;Patient/family education;Therapeutic Activities;Environmental controls   Daily Session  Skilled Therapeutic Interventions: Skilled treatment session focused on cognitive goals. SLP facilitated session by providing Min A verbal cues for problem solving with basic tasks (photo card - problem solving and peg board). Pt required Supervision verbal cues for selective attention to tasks in a moderately distractive environment for ~50 minutes for redirection. Pt demonstrates self-monitoring and self-correction of errors with Mod A verbal cues. Pt also required Min A verbal cues for inattention to left upper extremity during peg task. Patient left in wheelchair with all needs within reach and wife present. Continue with current plan of care.      Function:   Cognition Comprehension Comprehension assist level: Understands basic 50 - 74% of the time/ requires cueing 25 - 49% of the time  Expression   Expression assist level: Expresses basic 75 - 89% of the time/requires cueing 10 - 24% of the time.  Needs helper to occlude trach/needs to repeat words.  Social Interaction Social Interaction assist level: Interacts appropriately 75 - 89% of the time - Needs  redirection for appropriate language or to initiate interaction.  Problem Solving Problem solving assist level: Solves basic 50 - 74% of the time/requires cueing 25 - 49% of the time  Memory Memory assist level: Recognizes or recalls 25 - 49% of the time/requires cueing 50 - 75% of the time    Pain No/denies pain  Therapy/Group: Individual Therapy  Meredeth Ide  SLP - Student 12/24/2017, 3:46 PM

## 2017-12-24 NOTE — Progress Notes (Signed)
Physical Therapy Session Note  Patient Details  Name: Jaime Pierce MRN: [id number] Date of Birth: 03-13-1946  Today's Date: 12/24/2017 PT Individual Time: 1300-1400 PT Individual Time Calculation (min): 60 min   Short Term Goals: Week 1:  PT Short Term Goal 1 (Week 1): Pt will perform bed mobility modA supine<>sit from flat bed PT Short Term Goal 1 - Progress (Week 1): Updated due to goal met PT Short Term Goal 2 (Week 1): Pt will perform lateral scoot transfers consistent modA PT Short Term Goal 2 - Progress (Week 1): Progressing toward goal PT Short Term Goal 3 (Week 1): Pt will perform sit <>stand maxA +1 PT Short Term Goal 3 - Progress (Week 1): Progressing toward goal PT Short Term Goal 4 (Week 1): Pt will demonstrate OOB tolerance >4hours/day outside of therapy session PT Short Term Goal 4 - Progress (Week 1): Met  Skilled Therapeutic Interventions/Progress Updates:    Pt greeted sitting EOB with wife, pt appears more aware and responsive to questions today since baclofen reduced, agreeable to therapy, clinical specialist co-treated during session. Pt transferred bed>WC to L using squat pivot technique with max A +1, transported to therapy gym dependently in WC, transferred WC>mat to R using squat pivot technique with mod/max A +2, pt pushes backward and requires maximal vcing to anterior lean. Performs 3 x reaching activity to L with arm supported on ball, BLE support on floor. Performs 5 x reaching activity to L with feet unsupported R ankle crossed over L ankle, pt demonstrates improved R trunk shortening and L side lengthening, LUE supported when returning to midline to encourage muscular control vs slumping. Pt performs sit<>stand from elevated mat with RW x1 with three musketeers x2 with mod/max A, max vcing to push through feet, extend hips and extend trunk. Pt maintains static standing balance for approximately 5 mins with close supervision, demonstrates high guard with LUE but  able to relax it with verbal cuing. Performs stand pivot transfer to [facility] with modA +2 for safety, vcing for sequencing. At end of session, pt returned to room sitting upright in TIS with wife present, denies any needs at this time.  Therapy Documentation Precautions:  Precautions Precautions: Fall Precaution Comments: impaired procioceptive awareness bil.  Restrictions Weight Bearing Restrictions: No   See Function Navigator for Current Functional Status.   Therapy/Group: Individual Therapy  Salvatore Decent 12/24/2017, 2:51 PM

## 2017-12-24 NOTE — Progress Notes (Signed)
[facility] PHYSICAL MEDICINE & REHABILITATION     PROGRESS NOTE    Subjective/Complaints: Slept well.  Appetite remains good.  Reports no flushing or dizziness yesterday in therapy  ROS: pt denies nausea, vomiting, diarrhea, cough, shortness of breath or chest pain   Objective: Vital Signs: Blood pressure 114/70, pulse 82, temperature 97.9 F (36.6 C), temperature source Oral, resp. rate 18, height 6' (1.829 m), weight 74.7 kg (164 lb 10.9 oz), SpO2 92 %. No results found. Recent Labs    12/23/17 0814  WBC 9.5  HGB 12.9*  HCT 38.9*  PLT 363   Recent Labs    12/23/17 0814  NA 134*  K 4.0  CL 99*  GLUCOSE 137*  BUN 16  CREATININE 0.77  CALCIUM 9.0   CBG (last 3)  No results for input(s): GLUCAP in the last 72 hours.  Wt Readings from Last 3 Encounters:  12/22/17 74.7 kg (164 lb 10.9 oz)  12/07/17 81.4 kg (179 lb 7.3 oz)  12/03/17 79.8 kg (176 lb)    Physical Exam:  Constitutional: He isoriented to person, place, and time.Mood is a bit flat but appropriate HENT:  Head:Normocephalic.  Eyes:EOMI Neck:Normal range of motion.Neck supple.No thyromegalyprese during movement Nt.  Cardiovascular: RRR without murmur. No JVD             Respiratory: CTA Bilaterally without wheezes or rales. Normal effort     ZO:XWRU.Bowel sounds are normal. He exhibitsno distension Skin.  Resolving back rash Musc: Mild left AC joint tenderness.  Right knee tenderness. Neurological: He is  oriented to person, place, and time.  RUE:  3 out of 5 right deltoid biceps triceps--with delayed initiation. Wrist essentially 2+ to 3- out of 5. mAS 2/4 biceps, shoulder. Right lower extremity grossly 3 to 3+ out of 5 proximal distal with difficulty and motor sequencing. mAS  o 2 out of 4right hamstrings.    Movements and awareness of left side better. Left lower extremity grossly  4 out of 5 proximal to distal.   Psych: Pleasant and engaging      Assessment/Plan: 1.   Functional deficits secondary to intracranial hemorrhage which require 3+ hours per day of interdisciplinary therapy in a comprehensive inpatient rehab setting. Physiatrist is providing close team supervision and 24 hour management of active medical problems listed below. Physiatrist and rehab team continue to assess barriers to discharge/monitor patient progress toward functional and medical goals.  Function:  Bathing Bathing position Bathing activity did not occur: N/A Position: Shower  Bathing parts Body parts bathed by patient: Right arm, Chest, Abdomen, Right upper leg, Left upper leg, Front perineal area Body parts bathed by helper: Left arm, Buttocks, Right lower leg, Left lower leg  Bathing assist Assist Level: 2 helpers(+2 A to wash buttocks while OT rolls pt )      Upper Body Dressing/Undressing Upper body dressing Upper body dressing/undressing activity did not occur: N/A What is the patient wearing?: Pull over shirt/dress     Pull over shirt/dress - Perfomed by patient: Thread/unthread left sleeve Pull over shirt/dress - Perfomed by helper: Thread/unthread right sleeve, Put head through opening, Pull shirt over trunk        Upper body assist Assist Level: Touching or steadying assistance(Pt > 75%)      Lower Body Dressing/Undressing Lower body dressing Lower body dressing/undressing activity did not occur: N/A What is the patient wearing?: Underwear, Pants, [employer], Shoes   Underwear - Performed by helper: Thread/unthread right underwear leg, Thread/unthread left  underwear leg   Pants- Performed by helper: Thread/unthread right pants leg, Thread/unthread left pants leg, Pull pants up/down   Non-skid slipper socks- Performed by helper: Don/doff right sock, Don/doff left sock   Socks - Performed by helper: Don/doff right sock, Don/doff left sock   Shoes - Performed by helper: Don/doff right shoe, Don/doff left shoe, Fasten left, Fasten right       TED Hose -  Performed by helper: Don/doff right TED hose, Don/doff left TED hose  Lower body assist Assist for lower body dressing: 2 Helpers      Financial traderToileting Toileting Toileting activity did not occur: No continent bowel/bladder event   Toileting steps completed by helper: Adjust clothing prior to toileting, Performs perineal hygiene, Adjust clothing after toileting Toileting Assistive Devices: Grab bar or rail  Toileting assist Assist level: Touching or steadying assistance (Pt.75%)   Transfers Chair/bed transfer Chair/bed transfer activity did not occur: Safety/medical concerns Chair/bed transfer method: Squat pivot Chair/bed transfer assist level: 2 helpers Chair/bed transfer assistive device: Mechanical lift Mechanical lift: Landscape architecttedy   Locomotion Ambulation Ambulation activity did not occur: Safety/medical concerns   Max distance: 8 Assist level: 2 helpers   Education administratorWheelchair Wheelchair activity did not occur: Safety/medical concerns Type: Manual Max wheelchair distance: 10 ft Assist Level: Touching or steadying assistance (Pt > 75%)(B LEs)  Cognition Comprehension Comprehension assist level: Understands basic 50 - 74% of the time/ requires cueing 25 - 49% of the time  Expression Expression assist level: Expresses basic 50 - 74% of the time/requires cueing 25 - 49% of the time. Needs to repeat parts of sentences.  Social Interaction Social Interaction assist level: Interacts appropriately 50 - 74% of the time - May be physically or verbally inappropriate.  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 PT, OT, SLP  2. DVT Prophylaxis/Anticoagulation: SCDs. Monitor for any signs of DVT 3. Pain Management:    -mild left RTC syndrome, AC jt arthritis   -He has chronic right knee pain due to osteoarthritis   -Continue Voltaren gel to both areas. Ice to right knee    -Injected right knee 1/22 with improvement.          -continue TID baclofen for tone.    -gabapentin QHS for dysesthesias   -hasn't tolerated higher doses of baclofen and gabapentin 4. Mood:Provide emotional support 5. Neuropsych: This patientiscapable of making decisions on hisown behalf. 6. Skin/Wound Care:    -prn hydrocortisone cream Benadryl and hypoallergenic sheets   -Diflucan stopped    -Area  improved 7. Fluids/Electrolytes/Nutrition:Labs today are pending. 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 EEG 11/10/2017 negative. Initially on Keppra changed to Depakote 500 mg twice a day 01/15/2019as well as Vimpat 100 mg twice a day   -  seems to have  adjusted to medications.    -recheck VPA level next week  9.Hypertension. No current antihypertensive medication. Monitor with increased mobility 10.COPD with history of tobacco abuse in the past. Continue Elwin Sleightulera  LOS (Days) 14 A FACE TO FACE EVALUATION WAS PERFORMED  Ranelle Oyster, MD 12/24/2017 9:36 AM

## 2017-12-24 NOTE — Progress Notes (Signed)
Occupational Therapy Session Note  Patient Details  Name: Jaime Pierce MRN: [id number] Date of Birth: 1946/05/25  Today's Date: 12/24/2017 OT Individual Time: 0900-1015 OT Individual Time Calculation (min): 75 min    Short Term Goals: Week 2:  OT Short Term Goal 1 (Week 2): Pt will don pullover shirt with mod A OT Short Term Goal 2 (Week 2): Pt will perform sit<>stand with mod A in preparation for pulling up pants OT Short Term Goal 3 (Week 2): Pt will perform grooming tasks with min A OT Short Term Goal 4 (Week 2): Pt will perfomr BSC transfer with mod A  Skilled Therapeutic Interventions/Progress Updates:    OT intervention with focus on bathing at shower level and dressing with sit<>stand from w/c with Stedy.  Pt used Stedy to transfer to shower seat.  Pt requires mod verbal cues to initiate bathing tasks.  Pt donned bath mit to assist with bathing tasks.  Pt continues to exhibit difficulty raising LUE >90 on a consistent basis.  Pt continues to inconsistently exhibit pusher tendency to R when sitting and standing but is able to correct with min facilitation.  Pt currently requires tot A for dressing tasks.  Pt continues to exhibit motor planning deficits inhibiting active participation in dressing tasks.  Pt remained in w/c with wife present.   Therapy Documentation Precautions:  Precautions Precautions: Fall Precaution Comments: impaired procioceptive awareness bil.  Restrictions Weight Bearing Restrictions: No   Pain: Pain Assessment Pain Assessment: 0-10 Pain Score: 0-No pain  See Function Navigator for Current Functional Status.   Therapy/Group: Individual Therapy  Jaime Pierce, Jaime Pierce 12/24/2017, 12:29 PM

## 2017-12-25 ENCOUNTER — Inpatient Hospital Stay (HOSPITAL_COMMUNITY): Payer: Medicare HMO | Admitting: Occupational Therapy

## 2017-12-25 ENCOUNTER — Inpatient Hospital Stay (HOSPITAL_COMMUNITY): Payer: Medicare HMO

## 2017-12-25 NOTE — Progress Notes (Signed)
Occupational Therapy Session Note  Patient Details  Name: Delora FuelRichard A Gass MRN: [id number] Date of Birth: [birth year]  Today's Date: 12/25/2017 OT Individual Time: 1000-1100 OT Individual Time Calculation (min): 60 min    Short Term Goals: Week 2:  OT Short Term Goal 1 (Week 2): Pt will don pullover shirt with mod A OT Short Term Goal 2 (Week 2): Pt will perform sit<>stand with mod A in preparation for pulling up pants OT Short Term Goal 3 (Week 2): Pt will perform grooming tasks with min A OT Short Term Goal 4 (Week 2): Pt will perfomr BSC transfer with mod A  Skilled Therapeutic Interventions/Progress Updates:    treatment session focused on neuromuscular reeducation, transfer training, and pt education. Upon entering room, pt seated EOB and ready for therapy with wife present. Pt transferred from w/c to <>bed with SPT with mod A and v/c for hand/foo placement. Pt completed mat work in therapy gym for R>L NMR in EOB and side lying to increase ROM. Noted tightness in R shoulder, continue to work on passive ROM. Educated pt and wife on self PROM exercises. Pt tolerated upright sitting with observed R shoulder tightness. Therapist completed myofascial release to relax muscle and increase ROM. Pt returned to room via wife with no c/o pain.   Therapy Documentation Precautions:  Precautions Precautions: Fall Precaution Comments: impaired procioceptive awareness bil.  Restrictions Weight Bearing Restrictions: No Pain: Pain Assessment Pain Assessment: No/denies pain  See Function Navigator for Current Functional Status.   Therapy/Group: Individual Therapy  Verdie ShireMolly  Yadriel Kerrigan 12/25/2017, 12:26 PM

## 2017-12-25 NOTE — Progress Notes (Signed)
[place] PHYSICAL MEDICINE & REHABILITATION     PROGRESS NOTE    Subjective/Complaints: Eating with his wife's assistance secondary to his bilateral upper extremity weakness  ROS: pt denies nausea, vomiting, diarrhea, cough, shortness of breath or chest pain   Objective: Vital Signs: Blood pressure 125/68, pulse 74, temperature 97.6 F (36.4 C), temperature source Oral, resp. rate 16, height 6' (1.829 m), weight 74.7 kg (164 lb 10.9 oz), SpO2 94 %. No results found. Recent Labs    12/23/17 0814  WBC 9.5  HGB 12.9*  HCT 38.9*  PLT 363   Recent Labs    12/23/17 0814  NA 134*  K 4.0  CL 99*  GLUCOSE 137*  BUN 16  CREATININE 0.77  CALCIUM 9.0   CBG (last 3)  No results for input(s): GLUCAP in the last 72 hours.  Wt Readings from Last 3 Encounters:  12/22/17 74.7 kg (164 lb 10.9 oz)  12/07/17 81.4 kg (179 lb 7.3 oz)  12/03/17 79.8 kg (176 lb)    Physical Exam:  Constitutional: He isoriented to person, place, and time.Mood is a bit flat but appropriate HENT:  Head:Normocephalic.  Eyes:EOMI Neck:Normal range of motion.Neck supple.No thyromegalyprese during movement Nt.  Cardiovascular: RRR without murmur. No JVD             Respiratory: CTA Bilaterally without wheezes or rales. Normal effort     ZO:XWRUGI:Soft.Bowel sounds are normal. He exhibitsno distension Skin.  Resolving back rash Musc: Mild left AC joint tenderness.  Right knee tenderness. Neurological: He is  oriented to person, place, and time.  RUE:  3 out of 5 right deltoid biceps triceps--with delayed initiation. Wrist essentially 2+ to 3- out of 5. mAS 2/4 biceps, shoulder. Right lower extremity grossly 3 to 3+ out of 5 proximal distal with difficulty and motor sequencing. MAS   2 out of 4right hamstrings.    Movements and awareness of left side better. Left lower extremity grossly  4 out of 5 proximal to distal.   Psych: Pleasant and engaging      Assessment/Plan: 1.  Functional  deficits secondary to intracranial hemorrhage which require 3+ hours per day of interdisciplinary therapy in a comprehensive inpatient rehab setting. Physiatrist is providing close team supervision and 24 hour management of active medical problems listed below. Physiatrist and rehab team continue to assess barriers to discharge/monitor patient progress toward functional and medical goals.  Function:  Bathing Bathing position Bathing activity did not occur: N/A Position: Shower  Bathing parts Body parts bathed by patient: Right arm, Chest, Abdomen, Front perineal area, Right upper leg, Left upper leg Body parts bathed by helper: Left arm, Buttocks, Right lower leg, Left lower leg  Bathing assist Assist Level: 2 helpers(+2 A to wash buttocks while OT rolls pt )      Upper Body Dressing/Undressing Upper body dressing Upper body dressing/undressing activity did not occur: N/A What is the patient wearing?: Pull over shirt/dress     Pull over shirt/dress - Perfomed by patient: Thread/unthread left sleeve Pull over shirt/dress - Perfomed by helper: Thread/unthread right sleeve, Put head through opening, Pull shirt over trunk        Upper body assist Assist Level: Touching or steadying assistance(Pt > 75%)      Lower Body Dressing/Undressing Lower body dressing Lower body dressing/undressing activity did not occur: N/A What is the patient wearing?: Underwear, Pants, [employer] Family Insuranceed Hose, Shoes   Underwear - Performed by helper: Thread/unthread right underwear leg, Thread/unthread left underwear leg  Pants- Performed by helper: Thread/unthread right pants leg, Thread/unthread left pants leg, Pull pants up/down   Non-skid slipper socks- Performed by helper: Don/doff right sock, Don/doff left sock   Socks - Performed by helper: Don/doff right sock, Don/doff left sock   Shoes - Performed by helper: Don/doff right shoe, Don/doff left shoe, Fasten left, Fasten right       TED Hose - Performed by  helper: Don/doff right TED hose, Don/doff left TED hose  Lower body assist Assist for lower body dressing: 2 Helpers      Financial trader activity did not occur: No continent bowel/bladder event   Toileting steps completed by helper: Adjust clothing prior to toileting, Performs perineal hygiene, Adjust clothing after toileting Toileting Assistive Devices: Grab bar or rail  Toileting assist Assist level: Touching or steadying assistance (Pt.75%)   Transfers Chair/bed transfer Chair/bed transfer activity did not occur: Safety/medical concerns Chair/bed transfer method: Squat pivot Chair/bed transfer assist level: 2 helpers Chair/bed transfer assistive device: Mechanical lift Mechanical lift: Landscape architect Ambulation activity did not occur: Safety/medical concerns   Max distance: 8 Assist level: 2 helpers   Education administrator activity did not occur: Safety/medical concerns Type: Manual Max wheelchair distance: 10 ft Assist Level: Touching or steadying assistance (Pt > 75%)(B LEs)  Cognition Comprehension Comprehension assist level: Understands basic 50 - 74% of the time/ requires cueing 25 - 49% of the time  Expression Expression assist level: Expresses basic 75 - 89% of the time/requires cueing 10 - 24% of the time. Needs helper to occlude trach/needs to repeat words.  Social Interaction Social Interaction assist level: Interacts appropriately 75 - 89% of the time - Needs redirection for appropriate language or to initiate interaction.  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 PT, OT, SLP  2. DVT Prophylaxis/Anticoagulation: SCDs. Monitor for any signs of DVT 3. Pain Management:    -mild left RTC syndrome, AC jt arthritis   -He has chronic right knee pain due to osteoarthritis   -Continue Voltaren gel to both areas. Ice to right knee    -Injected right knee 1/22 with improvement.          -continue TID baclofen for tone.    -gabapentin QHS for dysesthesias   -hasn't tolerated higher doses of baclofen and gabapentin 4. Mood:Provide emotional support 5. Neuropsych: This patientiscapable of making decisions on hisown behalf. 6. Skin/Wound Care:    -prn hydrocortisone cream Benadryl and hypoallergenic sheets   -Diflucan stopped    -Area  improved 7. Fluids/Electrolytes/Nutrition:Labs today are pending. 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 EEG 11/10/2017 negative. Initially on Keppra changed to Depakote 500 mg twice a day 01/15/2019as well as Vimpat 100 mg twice a day   -No recurrent seizures on rehab   -recheck VPA level next week  9.Hypertension. No current antihypertensive medication. Monitor with increased mobility Vitals:   12/24/17 1530 12/24/17 2057  BP: 125/68   Pulse: 80 74  Resp: 19 16  Temp: 97.6 F (36.4 C)  SpO2: 94%    10.COPD with history of tobacco abuse in the past. Continue Dulera, no breathing difficulties   LOS (Days) 15 A FACE TO FACE EVALUATION WAS PERFORMED  Erick Colace, MD 12/25/2017 1:05 PM

## 2017-12-25 NOTE — Progress Notes (Signed)
Physical Therapy Note  Patient Details  Name: Delora FuelRichard A Benish MRN: [id number] Date of Birth: 11-09-1946 Today's Date: 12/25/2017  1640-1710, 30 min individual tx Pain: none per pt  Session focused on family ed with wife for positioning pt in R side lying for sleeping, spasticity reduction, pressure relief.  PT educated wife on ensuring that pt is bearing wt through scapula rather than acromion.  With pillows between knees, ankles, hugging one, and long towel roll behind back, pt was very comfortable and dozing almost immediately, stating the position felt "pretty good".    When sitting EOB, and asked to lean over so that PT could place slide board, pt incorrectly leaned L x 3 attempts, despite leading questions, visual cues as to placement of w/c to his L.  Slide board transfer bed> w/c to L, with supervision,  multimodal cues after set-up.    Gait training in // with max assist fading to mod assist, for R foot placement for wider BOS, wt shifting.  No R flexor withdrawal noted today, and pt advanced RLe without assistance.  Max cues fading to min for upright trunk, step lengths.  Wife pushed pt back to room in w/c.  She stated that he would stay up in chair until after dinner.  She is aware of fall precautions    See function navigator for current status.  Emilie Carp 12/25/2017, 12:59 PM

## 2017-12-26 DIAGNOSIS — G8194 Hemiplegia, unspecified affecting left nondominant side: Secondary | ICD-10-CM

## 2017-12-26 NOTE — Progress Notes (Signed)
[place] PHYSICAL MEDICINE & REHABILITATION     PROGRESS NOTE    Subjective/Complaints: Patient up in chair no new complaints today.  ROS: pt denies nausea, vomiting, diarrhea, cough, shortness of breath or chest pain   Objective: Vital Signs: Blood pressure (!) 143/78, pulse 87, temperature 97.8 F (36.6 C), temperature source Oral, resp. rate 12, height 6' (1.829 m), weight 74.7 kg (164 lb 10.9 oz), SpO2 92 %. No results found. No results for input(s): WBC, HGB, HCT, PLT in the last 72 hours. No results for input(s): NA, K, CL, GLUCOSE, BUN, CREATININE, CALCIUM in the last 72 hours.  Invalid input(s): CO CBG (last 3)  No results for input(s): GLUCAP in the last 72 hours.  Wt Readings from Last 3 Encounters:  12/22/17 74.7 kg (164 lb 10.9 oz)  12/07/17 81.4 kg (179 lb 7.3 oz)  12/03/17 79.8 kg (176 lb)    Physical Exam:  Constitutional: He isoriented to person, place, and time.Mood is a bit flat but appropriate HENT:  Head:Normocephalic.  Eyes:EOMI Neck:Normal range of motion.Neck supple.No thyromegalyprese during movement Nt.  Cardiovascular: RRR without murmur. No JVD             Respiratory: CTA Bilaterally without wheezes or rales. Normal effort     ZO:XWRUGI:Soft.Bowel sounds are normal. He exhibitsno distension Skin.  Resolving back rash Musc: Mild left AC joint tenderness.  Right knee tenderness. Neurological: He is  oriented to person, place, and time.  RUE:  3 out of 5 right deltoid biceps triceps--with delayed initiation. Wrist essentially 2+ to 3- out of 5. mAS 2/4 biceps, shoulder. Right lower extremity grossly 3 to 3+ out of 5 proximal distal with difficulty and motor sequencing. MAS   2 out of 4right hamstrings.    Movements and awareness of left side better. Left lower extremity grossly  4 out of 5 proximal to distal.   Psych: Pleasant and engaging      Assessment/Plan: 1.  Functional deficits secondary to intracranial hemorrhage which  require 3+ hours per day of interdisciplinary therapy in a comprehensive inpatient rehab setting. Physiatrist is providing close team supervision and 24 hour management of active medical problems listed below. Physiatrist and rehab team continue to assess barriers to discharge/monitor patient progress toward functional and medical goals.  Function:  Bathing Bathing position Bathing activity did not occur: N/A Position: Shower  Bathing parts Body parts bathed by patient: Right arm, Chest, Abdomen, Front perineal area, Right upper leg, Left upper leg Body parts bathed by helper: Left arm, Buttocks, Right lower leg, Left lower leg  Bathing assist Assist Level: 2 helpers(+2 A to wash buttocks while OT rolls pt )      Upper Body Dressing/Undressing Upper body dressing Upper body dressing/undressing activity did not occur: N/A What is the patient wearing?: Pull over shirt/dress     Pull over shirt/dress - Perfomed by patient: Thread/unthread left sleeve Pull over shirt/dress - Perfomed by helper: Thread/unthread right sleeve, Put head through opening, Pull shirt over trunk        Upper body assist Assist Level: Touching or steadying assistance(Pt > 75%)      Lower Body Dressing/Undressing Lower body dressing Lower body dressing/undressing activity did not occur: N/A What is the patient wearing?: Underwear, Pants, [employer]ed Hose, Shoes   Underwear - Performed by helper: Thread/unthread right underwear leg, Thread/unthread left underwear leg   Pants- Performed by helper: Thread/unthread right pants leg, Thread/unthread left pants leg, Pull pants up/down   Non-skid slipper socks-  Performed by helper: Don/doff right sock, Don/doff left sock   Socks - Performed by helper: Don/doff right sock, Don/doff left sock   Shoes - Performed by helper: Don/doff right shoe, Don/doff left shoe, Fasten left, Fasten right       TED Hose - Performed by helper: Don/doff right TED hose, Don/doff left TED  hose  Lower body assist Assist for lower body dressing: 2 Helpers      Toileting Toileting Toileting activity did not occur: No continent bowel/bladder event   Toileting steps completed by helper: Adjust clothing prior to toileting, Performs perineal hygiene, Adjust clothing after toileting Toileting Assistive Devices: Grab bar or rail  Toileting assist Assist level: Touching or steadying assistance (Pt.75%)   Transfers Chair/bed transfer Chair/bed transfer activity did not occur: Safety/medical concerns Chair/bed transfer method: Lateral scoot Chair/bed transfer assist level: Supervision or verbal cues Chair/bed transfer assistive device: Sliding board Mechanical lift: Stedy   Locomotion Ambulation Ambulation activity did not occur: Safety/medical concerns   Max distance: 16 Assist level: Maximal assist (Pt 25 - 49%)   Wheelchair Wheelchair activity did not occur: Safety/medical concerns Type: Manual Max wheelchair distance: 10 ft Assist Level: Touching or steadying assistance (Pt > 75%)(B LEs)  Cognition Comprehension Comprehension assist level: Understands basic 50 - 74% of the time/ requires cueing 25 - 49% of the time  Expression Expression assist level: Expresses basic 75 - 89% of the time/requires cueing 10 - 24% of the time. Needs helper to occlude trach/needs to repeat words.  Social Interaction Social Interaction assist level: Interacts appropriately 75 - 89% of the time - Needs redirection for appropriate language or to initiate interaction.  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 PT, OT, SLP, fatigue still an issue 2. DVT Prophylaxis/Anticoagulation: SCDs. Monitor for any signs of DVT 3. Pain Management:    -mild left RTC syndrome, AC jt arthritis   -He has chronic right knee pain due to osteoarthritis   -Continue Voltaren gel to both areas. Ice to right knee    -Injected right knee 1/22 with improvement.          -continue TID baclofen for tone.    -gabapentin QHS for dysesthesias   -hasn't tolerated higher doses of baclofen and gabapentin 4. Mood:Provide emotional support 5. Neuropsych: This patientiscapable of making decisions on hisown behalf. 6. Skin/Wound Care:    -prn hydrocortisone cream Benadryl and hypoallergenic sheets      -Area  improved 7. Fluids/Electrolytes/Nutrition:Labs today are pending. 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 EEG 11/10/2017 negative. Initially on Keppra changed to Depakote 500 mg twice a day 01/15/2019as well as Vimpat 100 mg twice a day   -No recurrent seizures on rehab   -recheck VPA level next week, no side effects from medications noted  9.Hypertension. No current antihypertensive medication. Monitor with increased mobility Vitals:   12/25/17 1500 12/26/17 0055  BP: 129/65 (!) 143/78  Pulse: 75 87  Resp: 17 12  Temp: 98.1 F (36.7 C) 97.8 F (36.6 C)  SpO2:  95% 92%  Elevated systolic this morning we will continue to monitor prior to initiating any treatment 10.COPD with history of tobacco abuse in the past. Continue Dulera, no breathing difficulties   LOS (Days) 16 A FACE TO FACE EVALUATION WAS PERFORMED  Erick Colace, MD 12/26/2017 12:14 PM

## 2017-12-27 ENCOUNTER — Inpatient Hospital Stay (HOSPITAL_COMMUNITY): Payer: Medicare HMO

## 2017-12-27 ENCOUNTER — Encounter (HOSPITAL_COMMUNITY): Payer: Medicare HMO | Admitting: Psychology

## 2017-12-27 ENCOUNTER — Inpatient Hospital Stay (HOSPITAL_COMMUNITY): Payer: Medicare HMO | Admitting: Physical Therapy

## 2017-12-27 ENCOUNTER — Encounter: Payer: Medicare HMO | Admitting: Occupational Therapy

## 2017-12-27 ENCOUNTER — Encounter: Payer: Medicare HMO | Admitting: Speech Pathology

## 2017-12-27 ENCOUNTER — Ambulatory Visit: Payer: Medicare HMO | Admitting: Physical Therapy

## 2017-12-27 ENCOUNTER — Inpatient Hospital Stay (HOSPITAL_COMMUNITY): Payer: Medicare HMO | Admitting: Speech Pathology

## 2017-12-27 NOTE — Progress Notes (Signed)
Speech Language Pathology Daily Session Note  Patient Details  Name: Jaime Pierce MRN: [id number] Date of Birth: 1946-08-26  Today's Date: 12/27/2017 SLP Individual Time: 1415-1505 SLP Individual Time Calculation (min): 50 min  Short Term Goals: Week 3: SLP Short Term Goal 1 (Week 3): Patient will demonstrate functional problem solving for mildly complex tasks with Min A verbal cues.  SLP Short Term Goal 2 (Week 3): Patient will recall new and functional information with Min A verbal cues.  SLP Short Term Goal 3 (Week 3): Patient will self-monitor and correct errors during tasks with Min A verbal cues.  SLP Short Term Goal 4 (Week 3): Patient will demonstrate alternating attention between two functional tasks for ~15 Minutes with Mod A verbal cues.  Skilled Therapeutic Interventions: Skilled treatment session focused on cognitive goals. SLP facilitated session by providing Mod A verbal cues for problem solving during a mildly complex task. Pt required Mod A verbal cues for recall of new information and procedures to the task. SLP transferred patient back to bed at end of session via Stedy with Max A verbal cues for sequencing. Pt left supine in bed with all needs within reach and daughter present. Continue with current plan of care.    Function:  Cognition Comprehension Comprehension assist level: Understands basic 50 - 74% of the time/ requires cueing 25 - 49% of the time  Expression   Expression assist level: Expresses basic 75 - 89% of the time/requires cueing 10 - 24% of the time. Needs helper to occlude trach/needs to repeat words.  Social Interaction Social Interaction assist level: Interacts appropriately 90% of the time - Needs monitoring or encouragement for participation or interaction.  Problem Solving Problem solving assist level: Solves basic 50 - 74% of the time/requires cueing 25 - 49% of the time  Memory Memory assist level: Recognizes or recalls 50 - 74% of the  time/requires cueing 25 - 49% of the time    Pain Pain Assessment Pain Assessment: No/denies pain  Therapy/Group: Individual Therapy  Roxan HockeyYahui Christal Lagerstrom  SLP - Student 12/27/2017, 3:51 PM

## 2017-12-27 NOTE — Progress Notes (Signed)
Physical Therapy Weekly Progress Note  Patient Details  Name: Jaime Pierce MRN: [id number] Date of Birth: [birth year]  Beginning of progress report period: December 20, 2017 End of progress report period: December 27, 2017  Today's Date: 12/27/2017 PT Individual Time: 1100-1200 PT Individual Time Calculation (min): 60 min   Patient has met 1 of 2 short term goals. Pt demonstrates improved awareness, decreased pushing behavior compared to last week, progressing steadily but slowly towards LTGs, continues to be limited by decreased motor planning, motor apraxia, increased muscle tone, and decreased postural control. Continues to provide general answers to questioning with decreased problem solving, benefits from external visual and verbal cues.   Patient continues to demonstrate the following deficits muscle weakness, muscle joint tightness and muscle paralysis, impaired timing and sequencing, abnormal tone, unbalanced muscle activation, motor apraxia, decreased coordination and decreased motor planning, decreased awareness, decreased problem solving, decreased safety awareness and delayed processing and decreased sitting balance, decreased standing balance, decreased postural control, hemiplegia and decreased balance strategies and therefore will continue to benefit from skilled PT intervention to increase functional independence with mobility.  Patient progressing toward long term goals.  Plan of care revisions: upgraded bed mobility goal to minA .  PT Short Term Goals Week 1:  PT Short Term Goal 1 (Week 1): Pt will perform bed mobility modA supine<>sit from flat bed PT Short Term Goal 1 - Progress (Week 1): Updated due to goal met PT Short Term Goal 2 (Week 1): Pt will perform lateral scoot transfers consistent modA PT Short Term Goal 2 - Progress (Week 1): Progressing toward goal PT Short Term Goal 3 (Week 1): Pt will perform sit <>stand maxA +1 PT Short Term Goal 3 - Progress (Week 1):  Progressing toward goal PT Short Term Goal 4 (Week 1): Pt will demonstrate OOB tolerance >4hours/day outside of therapy session PT Short Term Goal 4 - Progress (Week 1): Met Week 2:  PT Short Term Goal 1 (Week 2): Pt will ambulate 25 feet using LRAD mod A +1  PT Short Term Goal 1 - Progress (Week 2): Updated due to goal met Week 3:  PT Short Term Goal 1 (Week 3): Pt will ambulate 36fet using LRAD minA +1 PT Short Term Goal 2 (Week 3): Pt will perform sit<>stand modA +1  PT Short Term Goal 3 (Week 3): Pt will perform bed mobility minA +1 from flat bed Skilled Therapeutic Interventions/Progress Updates:    Pt greeted sitting upright in WC in room, wife present and encouraging throughout session. Pt transported to therapy gym, performs squat pivot transfer WC<>mat with max A with facilitation of anterior weightshift. Pt performs 5 reps of reaching activity to include weightbearing on RUE and on R/L elbow, pt demonstrates increased difficulty with motor planning reaching with RUE, verbal and visual cuing in mirror to bring awareness to RUE position frequently throughout task, greatly increased time to complete task. Pt performs sit<>stand from [facility] maxA +1, 3 x 232fgait in hallway using L handrail, modA +1 to maintain balance and manual facilitation of RLE abduction. Trial 2 using ACE wrap dorsiflexion assist with improve RLE toe clearance , Trial 3 using ground reaction AFO with improved L step length d/t improved R knee stability, manual facilitation of RLE abduction in swing phase d/t adductor tone. Maximal vcing and visual cues in mirror for posture. At end of session, wife returned pt to room in TIAvisdenies any needs at this time.   Therapy Documentation Precautions:  Precautions  Precautions: Fall Precaution Comments: impaired procioceptive awareness bil.  Restrictions Weight Bearing Restrictions: No Pain: Pain Assessment Pain Assessment: No/denies pain Other Treatments:    See Function  Navigator for Current Functional Status.  Therapy/Group: Individual Therapy  Salvatore Decent 12/27/2017, 12:45 PM

## 2017-12-27 NOTE — Progress Notes (Signed)
Occupational Therapy Session Note  Patient Details  Name: Jaime Pierce MRN: [id number] Date of Birth: 05-Jan-1946  Today's Date: 12/27/2017 OT Individual Time: 0900-1000 OT Individual Time Calculation (min): 60 min    Short Term Goals: Week 1:  OT Short Term Goal 1 (Week 1): Pt will sit to stand in stedy with MAX A of 1 in prep for functional transfers OT Short Term Goal 1 - Progress (Week 1): Met OT Short Term Goal 2 (Week 1): Pt will maintain dynamic sitting balance with no more than CGA in prep for LB dressing OT Short Term Goal 2 - Progress (Week 1): Met OT Short Term Goal 3 (Week 1): Pt will recall hemi techniques with min VC OT Short Term Goal 3 - Progress (Week 1): Met OT Short Term Goal 4 (Week 1): Pt will groom at sink with MOD A OT Short Term Goal 4 - Progress (Week 1): Met  Skilled Therapeutic Interventions/Progress Updates:    1;1. Pt and wife present throughout session sitting EOB. Pt slide board transfer on level surface with supervision and VC for checking slide baord placement in between BLE. Pt sit to stand in ADL apartmen at sink with R knee block and facilitation of RUE weight bearing on counter as pt crosses midline to retrieve cups from bottom 2 shelves with VC for weight shifting, R quad activation,  and up to MOD A for balance. Pt requires min scapular mobilization while reaching. Pt with longest standing trial up to 5 min and requires seated rest breaks in between with VC for controlled decent when stand>sit. Pt returned to room at end of session with wife present.  Therapy Documentation Precautions:  Precautions Precautions: Fall Precaution Comments: impaired procioceptive awareness bil.  Restrictions Weight Bearing Restrictions: No See Function Navigator for Current Functional Status.   Therapy/Group: Individual Therapy  Tonny Branch 12/27/2017, 10:12 AM

## 2017-12-27 NOTE — Progress Notes (Signed)
[facility]IC[facility]ILIT[facility]     PROGRESS NOTE    Subjective/Complaints: Patient feeling well.  Had a good weekend from a therapy standpoint.  ROS: pt denies nausea, vomiting, diarrhea, cough, shortness of breath or chest pain   Objective: Vital Signs: Blood pressure 122/72, pulse 72, temperature 97.6 F (36.4 C), temperature source Oral, resp. rate 15, height 6' (1.829 m), weight 74.7 kg (164 lb 10.9 oz), SpO2 94 %. No results found. No results for input(s): WBC, HGB, HCT, PLT in the last 72 hours. No results for input(s): NA, K, CL, GLUCOSE, BUN, CREATININE, CALCIUM in the last 72 hours.  Invalid input(s): CO CBG (last 3)  No results for input(s): GLUCAP in the last 72 hours.  Wt Readings from Last 3 Encounters:  12/22/17 74.7 kg (164 lb 10.9 oz)  12/07/17 81.4 kg (179 lb 7.3 oz)  12/03/17 79.8 kg (176 lb)    Physical Exam:  Constitutional: He isoriented to person, place, and time.Mood is a bit flat but appropriate HENT:  Head:Normocephalic.  Eyes:EOMI Neck:Normal range of motion.Neck supple. Cards: RRR without murmur. No JVD             Respiratory: CTA Bilaterally without wheezes or rales. Normal effort      WU:JWJX.Bowel sounds are normal. He exhibitsno distension Skin.  Resolving back rash Musc: Mild left AC joint tenderness.  Right knee tenderness. Neurological: He is  oriented to person, place, and time.  RUE:  3 out of 5 right deltoid biceps triceps--with delayed initiation. Wrist essentially 2+ to 3- out of 5. mAS 2/4 biceps, shoulder. Right lower extremity grossly 3 to 3+ out of 5 proximal distal with difficulty and motor sequencing. MAS   1+ to 2 out of 4right hamstrings. Stable today.    Movements and awareness of left side better. Left lower extremity grossly  4 out of 5 proximal to distal.  good sitting balance Psych: Pleasant and engaging      Assessment/Plan: 1.  Functional deficits secondary to intracranial  hemorrhage which require 3+ hours per day of interdisciplinary therapy in a comprehensive inpatient rehab setting. Physiatrist is providing close team supervision and 24 hour management of active medical problems listed below. Physiatrist and rehab team continue to assess barriers to discharge/monitor patient progress toward functional and medical goals.  Function:  Bathing Bathing position Bathing activity did not occur: N/A Position: Shower  Bathing parts Body parts bathed by patient: Right arm, Chest, Abdomen, Front perineal area, Right upper leg, Left upper leg Body parts bathed by helper: Left arm, Buttocks, Right lower leg, Left lower leg  Bathing assist Assist Level: 2 helpers(+2 A to wash buttocks while OT rolls pt )      Upper Body Dressing/Undressing Upper body dressing Upper body dressing/undressing activity did not occur: N/A What is the patient wearing?: Pull over shirt/dress     Pull over shirt/dress - Perfomed by patient: Thread/unthread left sleeve Pull over shirt/dress - Perfomed by helper: Thread/unthread right sleeve, Put head through opening, Pull shirt over trunk        Upper body assist Assist Level: Touching or steadying assistance(Pt > 75%)      Lower Body Dressing/Undressing Lower body dressing Lower body dressing/undressing activity did not occur: N/A What is the patient wearing?: Underwear, Pants, [employer], Shoes   Underwear - Performed by helper: Thread/unthread right underwear leg, Thread/unthread left underwear leg   Pants- Performed by helper: Thread/unthread right pants leg, Thread/unthread left pants leg, Pull pants up/down  Non-skid slipper socks- Performed by helper: Don/doff right sock, Don/doff left sock   Socks - Performed by helper: Don/doff right sock, Don/doff left sock   Shoes - Performed by helper: Don/doff right shoe, Don/doff left shoe, Fasten left, Fasten right       TED Hose - Performed by helper: Don/doff right TED hose,  Don/doff left TED hose  Lower body assist Assist for lower body dressing: 2 Helpers      Toileting Toileting Toileting activity did not occur: No continent bowel/bladder event   Toileting steps completed by helper: Adjust clothing prior to toileting, Performs perineal hygiene, Adjust clothing after toileting Toileting Assistive Devices: Grab bar or rail  Toileting assist Assist level: Touching or steadying assistance (Pt.75%)   Transfers Chair/bed transfer Chair/bed transfer activity did not occur: Safety/medical concerns Chair/bed transfer method: Lateral scoot Chair/bed transfer assist level: Supervision or verbal cues Chair/bed transfer assistive device: Sliding board Mechanical lift: Stedy   Locomotion Ambulation Ambulation activity did not occur: Safety/medical concerns   Max distance: 16 Assist level: Maximal assist (Pt 25 - 49%)   Wheelchair Wheelchair activity did not occur: Safety/medical concerns Type: Manual Max wheelchair distance: 10 ft Assist Level: Touching or steadying assistance (Pt > 75%)(B LEs)  Cognition Comprehension Comprehension assist level: Understands basic 50 - 74% of the time/ requires cueing 25 - 49% of the time  Expression Expression assist level: Expresses basic 75 - 89% of the time/requires cueing 10 - 24% of the time. Needs helper to occlude trach/needs to repeat words.  Social Interaction Social Interaction assist level: Interacts appropriately 75 - 89% of the time - Needs redirection for appropriate language or to initiate interaction.  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 PT, OT, SLP, fatigue seems better with reduction in medications 2. DVT Prophylaxis/Anticoagulation: SCDs. Monitor for any signs of DVT 3. Pain Management:    -mild left RTC syndrome, AC jt arthritis   -He has chronic right knee pain due to osteoarthritis   -Continue Voltaren gel to both areas. Ice to right knee    -Injected right knee 1/22 with improvement.          --gabapentin QHS for dysesthesias   -hasn't tolerated higher doses of baclofen and gabapentin 4. Mood:Provide emotional support 5. Neuropsych: This patientiscapable of making decisions on hisown behalf. 6. Skin/Wound Care:    -prn hydrocortisone cream Benadryl and hypoallergenic sheets      -Area  improved 7. Fluids/Electrolytes/Nutrition:Labs today are pending. 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 EEG 11/10/2017 negative. Initially on Keppra changed to Depakote 500 mg twice a day 01/15/2019as well as Vimpat 100 mg twice a day   -No recurrent seizures on rehab   -recheck VPA level this week week  9.Hypertension. No current antihypertensive medication. Monitor with increased mobility Vitals:   12/26/17 2000 12/27/17 0245  BP: 115/61 122/72  Pulse: 82 72  Resp:  15  Temp:  97.6 F (36.4 C)  SpO2: 93% 94%  Blood pressures improved this morning 10.COPD with history  of tobacco abuse in the past. Continue Dulera, no breathing difficulties 11. Spasticity: Continue baclofen 20 mg 3 times daily.  Will perform Botox injections at the office on 01/10/2018   LOS (Days) 17 A FACE TO FACE EVALUATION WAS PERFORMED  Ranelle OysterSWARTZ,Amanpreet Delmont T, MD 12/27/2017 8:46 AM

## 2017-12-27 NOTE — Progress Notes (Signed)
Physical Therapy Session Note  Patient Details  Name: Jaime Pierce MRN: [id number] Date of Birth: 1946/02/20  Today's Date: 12/27/2017 PT Individual Time: 1188-6773 PT Individual Time Calculation (min): 26 min   Short Term Goals: Week 3:  PT Short Term Goal 1 (Week 3): Pt will ambulate 56fet using LRAD minA +1 PT Short Term Goal 2 (Week 3): Pt will perform sit<>stand modA +1  PT Short Term Goal 3 (Week 3): Pt will perform bed mobility minA +1 from flat bed  Skilled Therapeutic Interventions/Progress Updates:    no c/o pain.  Session focus on NMR via gait training.  Pt taken to and from therapy gym total assist in w/c.    Gait x25' forward along rail with mod assist to advance RLE.  Pt with significant adductor/flexor tone, limiting his R step length and BOS.  Retro gait x8' +17' with min assist and tactile cues for upright posture and verbal cues for step length and foot clearance.  Pt returned to room at end of session and positioned in w/c with call bell in reach and needs met.  Daughter present at bedside.   Therapy Documentation Precautions:  Precautions Precautions: Fall Precaution Comments: impaired procioceptive awareness bil.  Restrictions Weight Bearing Restrictions: No   See Function Navigator for Current Functional Status.   Therapy/Group: Individual Therapy  CMichel Santee2/02/2018, 6:32 PM

## 2017-12-27 NOTE — Plan of Care (Signed)
  RH Bed Mobility LTG Patient will perform bed mobility with assist (PT) Description LTG: Patient will perform bed mobility with assistance, with/without cues (PT). 12/27/2017 1621 by Theodosia Quayowe, Lakenzie Mcclafferty, Student-PT Flowsheets Taken 12/27/2017 1621  LTG: Pt will perform bed mobility with assistance level of 4-Minimal Assistance (upgraded d/t progress) Note Upgraded d/t progress

## 2017-12-27 NOTE — Consult Note (Signed)
Neuropsychological Consultation   Patient:   Jaime Pierce   DOB:   21-Jul-1946  MR Number:  [id number]  [facility]  [facility] A [address] [id number] [place] [id number] Dept: [phone] Loc: [phone]           Date of Service:   12/15/2017  Start Time:   10 AM End Time:   11 AM  Provider/Observer:  Arley PhenixJohn Unknown Schleyer, Psy.D.       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  I saw patient during his previous CIR treatment in 2018.  Patient with new right hemisphere hemorrhage.  Evolved over time and after altered mental status noted regional mass effect without midline shift.  Patient with new symptoms of left side weakness but good mental status now.  Coping challenges due to second intracerebral hemorrhage and continuing to cope with prior hemiparesis of right side.  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 He had recently graduated to a quad cane with outpatient therapies. Wife would assist with some bathing and simple ADLs. Presented 12/06/2017 with left upper extremity tingling and weakness.Systolic blood pressure  110-120.Cranial CT scan showed newly seen intraparenchymal hemorrhage in the right inferior temporal lobe and right parietal lobe with some subarachnoid blood right parietal .Neurology follow-upsuspect cerebral amyloid angiopathy withhislatestcranial CT scan showed evolving acute right temporal and right parietal lobe intraparenchymal hematomas. No midline shift.rapid response contacted 12/09/2017 question altered mental status and again scan was repeated showing regional mass effect without midline shift. Small volume residual right parietal subarachnoid hemorrhage and again constellation findings compatible with amyloid angiopathy.EEG follow-up with no seizure activity. Patient remains on Depakote for history of seizure disorderas well as the addition of Vimpat. Tolerating a mechanical soft diet.Patient developed left shoulder pain overnight after going down for a CT scan. Shoulder x-ray reveals mild acromioclavicular arthritis and some degenerative changes likely associated with rotator cuff tendinopathy. Physicaland occupationaltherapy evaluationscompleted 12/07/2017 with recommendations of physical medicine rehabilitation consult.Patient was admitted for a comprehensive rehabilitation program  Current Status:  Patient is reported by wife to be frustrated due to having second event after his recent progress thru outpatient therapies with him progressing to 4 leg walker.  He reports that he fears those gains are lost and he will not be able to continue with walker.    Behavioral Observation: Jaime FuelRichard A Garris  presents as a 72 y.o.-year-old Right Caucasian Male who appeared his stated age. his dress was Appropriate and he was Well Groomed and his manners were Appropriate to the situation.  his participation was indicative of Appropriate and Attentive behaviors.  There were any physical disabilities noted.  he displayed an appropriate level of cooperation and motivation.     Interactions:     Active Appropriate and Attentive  Attention:   abnormal  and attention span appeared shorter than expected for age  Memory:   within normal limits; recent and remote memory intact  Visuo-spatial:  not examined  Speech (Volume):  low  Speech:   normal; normal  Thought Process:  Coherent and Relevant  Though Content:  WNL; not suicidal  Orientation:   person, place, time/date and situation  Judgment:   Good  Planning:   Good  Affect:    Depressed  Mood:    Dysphoric  Insight:   Good  Intelligence:   normal  Medical History:   Past Medical History:  Diagnosis Date  . Coronary artery disease   . Dyspnea on exertion   . Hemorrhagic stroke (HCC)   . Hyperlipidemia   . Hypertension   . Seizures (HCC)   . Stroke (HCC)   . Tobacco abuse        Family Med/Psych History:  Family History  Problem Relation Age of Onset  . Heart disease Father   . CVA Mother   . 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  I saw patient during his previous CIR treatment in 2018.  Patient with new right hemisphere hemorrhage.  Evolved over time and after altered mental status noted regional mass effect without midline shift.  Patient with new symptoms of left side weakness but good mental status now.  Coping challenges due to second intracerebral hemorrhage and continuing to cope with prior hemiparesis of right side.  Patient is reported by wife to be frustrated due to having second event after his recent progress thru outpatient therapies with him progressing to 4 leg walker.  He reports that he fears those gains are  lost and he will not be able to continue with walker.   Continued working on coping and adjustment issues.  Patient reports more worries about falling and has some situations at home where fear stopped him from going by a set of steps due to fear.  Patient is still having cognitive issues related to reduced attention, memory, and worry.           Electronically Signed   _______________________ Arley Phenix, Psy.D.

## 2017-12-28 ENCOUNTER — Inpatient Hospital Stay (HOSPITAL_COMMUNITY): Payer: Medicare HMO | Admitting: Occupational Therapy

## 2017-12-28 ENCOUNTER — Encounter: Payer: Medicare HMO | Admitting: Occupational Therapy

## 2017-12-28 ENCOUNTER — Inpatient Hospital Stay (HOSPITAL_COMMUNITY): Payer: Medicare HMO | Admitting: Physical Therapy

## 2017-12-28 ENCOUNTER — Ambulatory Visit: Payer: Medicare HMO

## 2017-12-28 ENCOUNTER — Inpatient Hospital Stay (HOSPITAL_COMMUNITY): Payer: Medicare HMO | Admitting: Speech Pathology

## 2017-12-28 ENCOUNTER — Inpatient Hospital Stay (HOSPITAL_COMMUNITY): Payer: Medicare HMO

## 2017-12-28 NOTE — Patient Care Conference (Signed)
Inpatient RehabilitationTeam Conference and Plan of Care Update Date: 12/28/2017   Time: 2:00 PM    Patient Name: Jaime Pierce      Medical Record Number: [id number]  Date of Birth: 12-23-1945 Sex: Male         Room/Bed: 4W10C/4W10C-01 Payor Info: Payor: [employer] MEDICARE / Plan: [employer] MEDICARE HMO/PPO / Product Type: *No Product type* /    Admitting Diagnosis: Stroke  Admit Date/Time:  12/10/2017  3:03 PM Admission Comments: No comment available   Primary Diagnosis:  Cerebral amyloid angiopathy (CODE) Principal Problem: Cerebral amyloid angiopathy (CODE)  Patient Active Problem List   Diagnosis Date Noted  . Intracerebral hemorrhage 12/10/2017  . Cerebral amyloid angiopathy (CODE) 12/09/2017  . Seizures (HCC) 12/09/2017  . ICH (intracerebral hemorrhage) (HCC) 12/06/2017  . Partial seizure (HCC) 11/08/2017  . Positive colorectal cancer screening using Cologuard test 10/22/2017  . Hemiparesis affecting right side as late effect of cerebrovascular accident (CVA) (HCC)   . Cough   . 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 Pain   . Hyponatremia   . Benign essential HTN   . 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 Gait disturbance, post-stroke   . Aphasia as late effect of stroke   . SAH (subarachnoid hemorrhage) (HCC) 09/02/2017  . B12 deficiency 09/02/2017  . Well adult exam 06/16/2016  . Cigarette smoker 02/23/2016  . COPD GOLD II  12/24/2014  . CAD (coronary artery disease) 07/22/2011  . HTN (hypertension) 07/22/2011  . Dyslipidemia 07/22/2011  . Smoker 07/22/2011    Expected Discharge Date: Expected Discharge Date: 01/08/18  Team Members Present: Physician leading conference: Dr. Faith Rogue Social Worker Present: Staci Acosta, LCSW Nurse Present: Adora Fridge, RN PT Present: Alyson Reedy, PT OT Present: Roney Mans, OT SLP Present: Colin Benton, SLP PPS Coordinator  present : Tora Duck, RN, CRRN     Current Status/Progress Goal Weekly Team Focus  Medical   More alert.  Did not tolerate increased gabapentin or baclofen thus these meds were reduced.  Spasticity still an issue right arm and leg and will require Botox as an outpatient  Increase  Monitoring of tolerance of medications antiseizure meds.  Continued balancing of blood pressure and volume   Bowel/Bladder   Continent of bladder/bowel , LBM 12/27/2017, voids appropriately in urinal,   remain continent of bowel and bladder min assist  QS assessment, follow up on episodes of constipation with prescribe/routine and prn medication, encourage po liquids    Swallow/Nutrition/ Hydration             ADL's   overall max A ADLS mod A transfers and standing balance mod A during all functional tasks,   bathing-min A; UB dressing-min A; LB dressing-mod A; tub transfers-mod A; toilet transfers-min A; toiletintg-mod A- transfer goals may need to be downgraed  transfer training, NMR, family education, BADL retraining, sit to stands.   Mobility   modA bed mobility from flat bed, mod/max sit<>stand, amb 25 ft mod A +2   modA bed mobility, transfers and gait short distances; minA w/c mobility  gait/transfer training, endurance, postural control, WC mobility    Communication             Safety/Cognition/ Behavioral Observations  Min-Mod A   Min A   problem solving, recall, awareness    Pain   Intermittent pain left shoulder/arm and rt. Knee , Hx of OA   < 4 score  mod assistance   QS assess of pain/discomfort, medicate with prn Tylenol, Voltran creams, repositioning, and etc. per orders, evalaute effectiveness and document   Skin   Upper/ lower extremites body rash improving snf resolving slowly,   No new skin integity issues or breakdown  Continue QS assess and routine schedule care, RX and orders, with Rx, Notify all changes to MD/PA     Rehab Goals Patient on target to meet rehab goals: Yes *See Care Plan and  progress notes for long and short-term goals.     Barriers to Discharge  Current Status/Progress Possible Resolutions Date Resolved   Physician    Medical stability        Reduction in right upper extremity tone, ongoing medically as described above.      Nursing                  PT                    OT                  SLP                SW                Discharge Planning/Teaching Needs:  Plan for pt to d/c home with wife providing 24/7 assistance.    Wife attends most therapy sessions and actively participates.   Team Discussion:  Better overall alertness with decrease in medications for tone.  Likely to do botox as OP.  Currently mod assist to stand plus cueing to maintain midline.  Mod assist bed mobility.  Min - mod assist goals overall.  Wife wanting an extension, however, does not feel they can justify this at this time.  Will plan to re-visit possible extension in next week's conference IF there is presumed benefit in overall function.  Revisions to Treatment Plan:  None       Jaime Pierce 12/29/2017, 8:58 AM

## 2017-12-28 NOTE — Progress Notes (Signed)
Occupational Therapy Session Note  Patient Details  Name: Jaime Pierce MRN: [id number] Date of Birth: January 11, 1946  Skilled Therapeutic Interventions/Progress Updates: focus on session on helping decrease tone through body.   Patient seated in his w/c and also practiced postural control and alignment with arm rests removed.    Patient very tight all over, and much of session was help patient increase sitting mobility, stretching, pelvic motility, trunk rotation, and as well, right UE very stiff and tight.    Wife present for session.  Patient handed off to PT at end of session     Therapy Documentation Precautions:  Precautions Precautions: Fall Precaution Comments: impaired procioceptive awareness bil.  Restrictions Weight Bearing Restrictions: No   Pain:denied    Therapy/Group: Individual Therapy  Rozelle Loganickett, Chuckie Mccathern Yeary 12/28/2017, 2:36 PM

## 2017-12-28 NOTE — Progress Notes (Signed)
[place] PHYSICAL MEDICINE & REHABILITATION     PROGRESS NOTE    Subjective/Complaints: No new complaints.  Feels that he walked better yesterday.  Still has some fatigue at times.  ROS: pt denies nausea, vomiting, diarrhea, cough, shortness of breath or chest pain   Objective: Vital Signs: Blood pressure 131/71, pulse 85, temperature 97.6 F (36.4 C), temperature source Oral, resp. rate 18, height 6' (1.829 m), weight 74.7 kg (164 lb 10.9 oz), SpO2 94 %. No results found. No results for input(s): WBC, HGB, HCT, PLT in the last 72 hours. No results for input(s): NA, K, CL, GLUCOSE, BUN, CREATININE, CALCIUM in the last 72 hours.  Invalid input(s): CO CBG (last 3)  No results for input(s): GLUCAP in the last 72 hours.  Wt Readings from Last 3 Encounters:  12/22/17 74.7 kg (164 lb 10.9 oz)  12/07/17 81.4 kg (179 lb 7.3 oz)  12/03/17 79.8 kg (176 lb)    Physical Exam:  Constitutional: He isoriented to person, place, and time.Mood is a bit flat but appropriate HENT:  Head:Normocephalic.  Eyes:EOMI Neck:Normal range of motion.Neck supple. Cards: RRR without murmur. No JVD              Respiratory:CTA Bilaterally without wheezes or rales. Normal effort     ZO:XWRUGI:Soft.Bowel sounds are normal. He exhibitsno distension Skin.  Resolving back rash Musc: Mild left AC joint tenderness.  Right knee tenderness. Neurological: He is  oriented to person, place, and time.  RUE:  3 out of 5 right deltoid biceps triceps--with delayed initiation. Wrist essentially 2+ to 3- out of 5. mAS 2/4 biceps, shoulder. Right lower extremity grossly 3 to 3+ out of 5 proximal distal with difficulty and motor sequencing. MAS   1+ to 2 out of 4right hamstrings.  Able to feed self with left hand using extra time.    Movements and awareness of left side better. Left lower extremity grossly  4 out of 5 proximal to distal.  good sitting balance once again Psych: Pleasant and engaging       Assessment/Plan: 1.  Functional deficits secondary to intracranial hemorrhage which require 3+ hours per day of interdisciplinary therapy in a comprehensive inpatient rehab setting. Physiatrist is providing close team supervision and 24 hour management of active medical problems listed below. Physiatrist and rehab team continue to assess barriers to discharge/monitor patient progress toward functional and medical goals.  Function:  Bathing Bathing position Bathing activity did not occur: N/A Position: Shower  Bathing parts Body parts bathed by patient: Right arm, Chest, Abdomen, Front perineal area, Right upper leg, Left upper leg Body parts bathed by helper: Left arm, Buttocks, Right lower leg, Left lower leg  Bathing assist Assist Level: 2 helpers(+2 A to wash buttocks while OT rolls pt )      Upper Body Dressing/Undressing Upper body dressing Upper body dressing/undressing activity did not occur: N/A What is the patient wearing?: Pull over shirt/dress     Pull over shirt/dress - Perfomed by patient: Thread/unthread left sleeve Pull over shirt/dress - Perfomed by helper: Thread/unthread right sleeve, Put head through opening, Pull shirt over trunk        Upper body assist Assist Level: Touching or steadying assistance(Pt > 75%)      Lower Body Dressing/Undressing Lower body dressing Lower body dressing/undressing activity did not occur: N/A What is the patient wearing?: Underwear, Pants, American Family Insuranceed Hose, Shoes   Underwear - Performed by helper: Thread/unthread right underwear leg, Thread/unthread left underwear leg  Pants- Performed by helper: Thread/unthread right pants leg, Thread/unthread left pants leg, Pull pants up/down   Non-skid slipper socks- Performed by helper: Don/doff right sock, Don/doff left sock   Socks - Performed by helper: Don/doff right sock, Don/doff left sock   Shoes - Performed by helper: Don/doff right shoe, Don/doff left shoe, Fasten left, Fasten right        TED Hose - Performed by helper: Don/doff right TED hose, Don/doff left TED hose  Lower body assist Assist for lower body dressing: 2 Helpers      Toileting Toileting Toileting activity did not occur: No continent bowel/bladder event   Toileting steps completed by helper: Adjust clothing prior to toileting, Performs perineal hygiene, Adjust clothing after toileting Toileting Assistive Devices: Other (comment)(stedy)  Toileting assist Assist level: Two helpers   Transfers Chair/bed transfer Chair/bed transfer activity did not occur: Safety/medical concerns Chair/bed transfer method: Squat pivot Chair/bed transfer assist level: Maximal assist (Pt 25 - 49%/lift and lower) Chair/bed transfer assistive device: Sliding board Mechanical lift: Stedy   Locomotion Ambulation Ambulation activity did not occur: Safety/medical concerns   Max distance: 25 ft x 3  Assist level: Maximal assist (Pt 25 - 49%)   Wheelchair Wheelchair activity did not occur: Safety/medical concerns Type: Manual Max wheelchair distance: 10 ft Assist Level: Touching or steadying assistance (Pt > 75%)(B LEs)  Cognition Comprehension Comprehension assist level: Understands basic 50 - 74% of the time/ requires cueing 25 - 49% of the time  Expression Expression assist level: Expresses basic 75 - 89% of the time/requires cueing 10 - 24% of the time. Needs helper to occlude trach/needs to repeat words.  Social Interaction Social Interaction assist level: Interacts appropriately 90% of the time - Needs monitoring or encouragement for participation or interaction.  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 PT, OT, SLP, team conference today 2. DVT Prophylaxis/Anticoagulation: SCDs. Monitor for any signs of DVT 3. Pain Management:    -mild left RTC syndrome, AC jt arthritis   -He has chronic right knee pain due to osteoarthritis   -Continue Voltaren gel to both areas. Ice to right knee    -Injected right knee 1/22 with improvement.          --gabapentin QHS for dysesthesias   -hasn't tolerated higher doses of baclofen and gabapentin 4. Mood:Provide emotional support 5. Neuropsych: This patientiscapable of making decisions on hisown behalf. 6. Skin/Wound Care:    -prn hydrocortisone cream Benadryl and hypoallergenic sheets   Resolved 7. Fluids/Electrolytes/Nutrition:Labs today are pending. 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 EEG 11/10/2017 negative. Initially on Keppra changed to Depakote 500 mg twice a day 01/15/2019as well as Vimpat 100 mg twice a day   -No recurrent seizures on rehab   -recheck VPA level tomorrow   -Wife asked about reducing medications potentially to decrease lethargy although I am not sure there is much room to move with his medications  9.Hypertension. No current antihypertensive medication. Monitor with increased mobility Vitals:   12/28/17 0500 12/28/17 1610  BP: 138/65 131/71  Pulse: 72 85  Resp: 16 18  Temp: 98 F (36.7 C) 97.6 F (36.4 C)  SpO2: 95% 94%  Blood pressures improved this morning 10.COPD with history of tobacco abuse in the past. Continue Dulera, no breathing difficulties 11. Spasticity: Continue baclofen 20 mg 3 times daily.  Will perform Botox injections at the office on 01/10/2018   LOS (Days) 18 A FACE TO FACE EVALUATION WAS PERFORMED  Ranelle Oyster, MD 12/28/2017 8:51 AM

## 2017-12-28 NOTE — Progress Notes (Signed)
Physical Therapy Session Note  Patient Details  Name: Jaime Pierce MRN: [id number] Date of Birth: 11/16/1946  Today's Date: 12/28/2017 PT Individual Time: 1300-1400 PT Individual Time Calculation (min): 60 min   Skilled Therapeutic Interventions/Progress Updates:    Pt was greeted sitting upright in manual WC in room, agreeable to therapy, wife present throughout session. Pt propelled WC using LUE, BLE approximately 75 ft with intermittent manual assistance and vcing for steering, heavy reliance on feet to pedal vs UE use. Pt performs stand pivot transfer to [employer] with maxA to boost, modA once standing to take pivotal steps. Performs reciprocal marching in sitting through half range, emphasis on pushing through RLE same distance as LLE. Performs sit<>stand on kinetron x 3 trials with modA, tolerates standing for 5 minutes x 3 trials with emphasis on weightbearing through RLE and equal weightbearing through BLE, demonstrates decreased motor planning, motor apraxia. Ambulates approximately 9910ft using RW modA, heavy verbal and tactile cues to advance RLE and to increase step length. Pt transported back to room in manual WC, performs stand pivot transfer WC<>bed using RW, heavy verbal and tactile cues to increase step length, minimal clearance of LLE, attempts to move both feet at same time despite maximal verbal cues for sequencing. At end of session, pt left sitting upright in manual WC, wife present, denies any needs at this time.  Therapy Documentation Precautions:  Precautions Precautions: Fall Precaution Comments: impaired procioceptive awareness bil.  Restrictions Weight Bearing Restrictions: No   Pain: Pain Assessment Pain Assessment: No/denies pain   See Function Navigator for Current Functional Status.   Therapy/Group: Individual Therapy  Theodosia QuayMorgan Adib Wahba 12/28/2017, 3:43 PM

## 2017-12-28 NOTE — Progress Notes (Signed)
Physical Therapy Session Note  Patient Details  Name: Jaime Pierce MRN: [id number] Date of Birth: [birth year]  Today's Date: 12/28/2017 PT Individual Time: 1035-1100 PT Individual Time Calculation (min): 25 min   Short Term Goals: Week 3:  PT Short Term Goal 1 (Week 3): Pt will ambulate 31fet using LRAD minA +1 PT Short Term Goal 2 (Week 3): Pt will perform sit<>stand modA +1  PT Short Term Goal 3 (Week 3): Pt will perform bed mobility minA +1 from flat bed  Skilled Therapeutic Interventions/Progress Updates:   Pt in w/c and agreeable to therapy, no c/o pain. Total assist w/c transport to/from gym for time management. Transferred to/from mat w/ mod assist via lateral scoot, manual assist for weight shifting and technique. Worked on dynamic sitting balance w/ emphasis on R trunk activation, postural control w/ reaching tasks, and trunk rotation. Performed clothespin activity w/ verbal cues for clothespin use 50% of time 2/2 apraxia, used LUE to perform task. Occasional verbal cues for R trunk activation. Close supervision for safety. Returned to w/c and returned to room. Ended session in w/c and in care of wife, all needs met.   Therapy Documentation Precautions:  Precautions Precautions: Fall Precaution Comments: impaired procioceptive awareness bil.  Restrictions Weight Bearing Restrictions: No Vital Signs: Therapy Vitals Temp: 97.6 F (36.4 C) Temp Source: Oral Pulse Rate: 85 Resp: 18 BP: 131/71 Patient Position (if appropriate): Sitting Oxygen Therapy SpO2: 94 % O2 Device: Not Delivered  See Function Navigator for Current Functional Status.   Therapy/Group: Individual Therapy  Evanie Buckle K Arnette 12/28/2017, 12:01 PM

## 2017-12-28 NOTE — Progress Notes (Signed)
Occupational Therapy Session Note  Patient Details  Name: Jaime Pierce MRN: [id number] Date of Birth: 16-Nov-1946  Today's Date: 12/28/2017 OT Individual Time: 0900-1000 OT Individual Time Calculation (min): 60 min    Short Term Goals: Week 2:  OT Short Term Goal 1 (Week 2): Pt will don pullover shirt with mod A OT Short Term Goal 2 (Week 2): Pt will perform sit<>stand with mod A in preparation for pulling up pants OT Short Term Goal 3 (Week 2): Pt will perform grooming tasks with min A OT Short Term Goal 4 (Week 2): Pt will perfomr BSC transfer with mod A  Skilled Therapeutic Interventions/Progress Updates:    1:1. Pt squat pivot transfer EOB>w/c with MOD A lifting assistance and Vc for trunk flexion. OT propels w/c to tx gym for energy conservation. Pt sit to stand 2x5 min from w/c with RW with MAX A to lift/lower and A to guide RUE onto RW for weight bearing. While standing pt requires VC for weight shifting L, R knee extension, and chest elevation as pt setsup large magnetic checker board with facilitation of trunk rotation with touching A while reaching for NMR. Pt requires MOD VC for recalling which squares to place checkers on. Pt sits in w/c and plays game of checkers with min question cues for strategy, rule following, increased time for procesing and attention. Exited session with wife assisting pt able to room in w/c.  Therapy Documentation Precautions:  Precautions Precautions: Fall Precaution Comments: impaired procioceptive awareness bil.  Restrictions Weight Bearing Restrictions: No  See Function Navigator for Current Functional Status.   Therapy/Group: Individual Therapy  Jaime Pierce 12/28/2017, 11:29 AM

## 2017-12-28 NOTE — Plan of Care (Signed)
  RH SKIN INTEGRITY RH STG MAINTAIN SKIN INTEGRITY WITH ASSISTANCE Description STG Maintain Skin Integrity With Mod Assistance.  12/28/2017 1547 - Progressing by Thurston HoleBennett, Aela Bohan, RN   RH SAFETY RH STG ADHERE TO SAFETY PRECAUTIONS W/ASSISTANCE/DEVICE Description STG Adhere to Safety Precautions With mod  Assistance/Device.  12/28/2017 1547 - Progressing by Thurston HoleBennett, Trell Secrist, RN

## 2017-12-28 NOTE — Progress Notes (Signed)
Speech Language Pathology Daily Session Note  Patient Details  Name: Jaime Pierce MRN: [id number] Date of Birth: 03-09-1946  Today's Date: 12/28/2017 SLP Individual Time: 1400-1430 SLP Individual Time Calculation (min): 30 min  Short Term Goals: Week 3: SLP Short Term Goal 1 (Week 3): Patient will demonstrate functional problem solving for mildly complex tasks with Min A verbal cues.  SLP Short Term Goal 2 (Week 3): Patient will recall new and functional information with Min A verbal cues.  SLP Short Term Goal 3 (Week 3): Patient will self-monitor and correct errors during tasks with Min A verbal cues.  SLP Short Term Goal 4 (Week 3): Patient will demonstrate alternating attention between two functional tasks for ~15 Minutes with Mod A verbal cues.  Skilled Therapeutic Interventions: Skilled treatment session focused on cognitive goals. SLP facilitated session by providing Mod A verbal cues for problem solving during a mildly complex kitchen task. Pt required Mod A verbal cues for self-monitoring and correction of errors during task. Pt left upright in wheelchair with wife and daughter present. Continue with current plan of care.    Function:  Cognition Comprehension Comprehension assist level: Understands basic 75 - 89% of the time/ requires cueing 10 - 24% of the time  Expression   Expression assist level: Expresses basic 75 - 89% of the time/requires cueing 10 - 24% of the time. Needs helper to occlude trach/needs to repeat words.  Social Interaction Social Interaction assist level: Interacts appropriately 90% of the time - Needs monitoring or encouragement for participation or interaction.  Problem Solving Problem solving assist level: Solves basic 50 - 74% of the time/requires cueing 25 - 49% of the time  Memory Memory assist level: Recognizes or recalls 50 - 74% of the time/requires cueing 25 - 49% of the time    Pain Pain Assessment Pain Assessment: No/denies  pain  Therapy/Group: Individual Therapy  Roxan HockeyYahui Jocelyne Reinertsen  SLP - Student 12/28/2017, 3:19 PM

## 2017-12-29 ENCOUNTER — Ambulatory Visit: Payer: Medicare HMO | Admitting: Physical Therapy

## 2017-12-29 ENCOUNTER — Inpatient Hospital Stay (HOSPITAL_COMMUNITY): Payer: Medicare HMO | Admitting: Speech Pathology

## 2017-12-29 ENCOUNTER — Inpatient Hospital Stay (HOSPITAL_COMMUNITY): Payer: Medicare HMO

## 2017-12-29 ENCOUNTER — Encounter: Payer: Medicare HMO | Admitting: Occupational Therapy

## 2017-12-29 ENCOUNTER — Inpatient Hospital Stay (HOSPITAL_COMMUNITY): Payer: Medicare HMO | Admitting: Physical Therapy

## 2017-12-29 LAB — VALPROIC ACID LEVEL: Valproic Acid Lvl: 44 ug/mL — ABNORMAL LOW (ref 50.0–100.0)

## 2017-12-29 NOTE — Progress Notes (Signed)
Occupational Therapy Session Note  Patient Details  Name: Ketih A Gunther MDelora FuelRN: [id number] Date of Birth: 05/24/1946  Today's Date: 12/29/2017 OT Individual Time: 0900-1015 OT Individual Time Calculation (min): 75 min    Short Term Goals: Week 2:  OT Short Term Goal 1 (Week 2): Pt will don pullover shirt with mod A OT Short Term Goal 2 (Week 2): Pt will perform sit<>stand with mod A in preparation for pulling up pants OT Short Term Goal 3 (Week 2): Pt will perform grooming tasks with min A OT Short Term Goal 4 (Week 2): Pt will perfomr BSC transfer with mod A  Skilled Therapeutic Interventions/Progress Updates:    Pt sitting EOB upon arrival with wife present.  Pt dressed and ready for therapy.  [name] expressed desire to continue working on transfers and BUE use for functional tasks.  Pt required max a for squat pivot transfer to w/c and w/c>mat in gym.  OT intervention with focus on LUE reaching tasks, trunk rotation, sit<>stand, lateral leans, and reciprocal scooting on mat.  Pt continues to exhibit motor apraxia with all transitional movements and requires max multimodal cues for positioning and sequencing.  Pt's RLE exhibits increased flexor reaction when standing which results in pt leaning to his R.  Pt noted with inconsistent push to R with sit<>stand.  Pt continues to exhibit difficulty with lifting/reaising his buttocks off mat/chair with posterior lean noted.  Pt returned to w/c and room.  Pt remained in w/c with wife present.   Therapy Documentation Precautions:  Precautions Precautions: Fall Precaution Comments: impaired procioceptive awareness bil.  Restrictions Weight Bearing Restrictions: No Pain:  Pt denies pain  See Function Navigator for Current Functional Status.   Therapy/Group: Individual Therapy  Rich BraveLanier, Dema Timmons Chappell 12/29/2017, 3:09 PM

## 2017-12-29 NOTE — Progress Notes (Signed)
Physical Therapy Session Note  Patient Details  Name: Jaime Pierce MRN: [id number] Date of Birth: 02-Aug-1946  Today's Date: 12/29/2017 PT Individual Time: 1300-1415 PT Individual Time Calculation (min): 75 min   Short Term Goals: Week 2:  PT Short Term Goal 1 (Week 2): Pt will ambulate 25 feet using LRAD mod A +1  PT Short Term Goal 1 - Progress (Week 2): Updated due to goal met Week 3:  PT Short Term Goal 1 (Week 3): Pt will ambulate 8fet using LRAD minA +1 PT Short Term Goal 2 (Week 3): Pt will perform sit<>stand modA +1  PT Short Term Goal 3 (Week 3): Pt will perform bed mobility minA +1 from flat bed  Skilled Therapeutic Interventions/Progress Updates:    Pt was greeted sitting EOB with wife, performs squat pivot bed>WC<>mat using min/modA for boosting, manual facilitation and vcing for anterior weight shift. Performs weight shifting activity to L/R to target of wall with toe taps on box using RLE/LLE, requires constant vcing to lean to towards wall, maintain upright posture, and for sequencing, pt demonstrates difficulty following two-step commands, vcing to increase speed when tapping with LLE. Pt ambulates 2x 276fusing LUE on hallway rail trial 1 3 musketeers trial 2, modA +1 for RLE advancement and tactile cues for R knee extension in stance, minimal carryover for weightshiftng to L in order to offload RLE, vcing to increase L step length to step-thru pattern. Pt transfers to mat as above, emphasizing anterior weight shift in sit<>stand, pt holds horseshoe with BUE and vcing to anterior weight shift before standing, decreased ability to follow two step command, attempts to stand before completing forward lean however improves with multiple trials. At end of session, pt's wife transports pt to speech therapy session, denies any needs at this time.  Therapy Documentation Precautions:  Precautions Precautions: Fall Precaution Comments: impaired procioceptive awareness bil.   Restrictions Weight Bearing Restrictions: No   See Function Navigator for Current Functional Status.   Therapy/Group: Individual Therapy  MoSalvatore Decent/04/2018, 3:26 PM

## 2017-12-29 NOTE — Progress Notes (Signed)
Speech Language Pathology Daily Session Note  Patient Details  Name: Jaime FuelRichard A Gwynn MRN: [id number] Date of Birth: 10/09/1946  Today's Date: 12/29/2017 SLP Individual Time: 1415-1505 SLP Individual Time Calculation (min): 50 min  Short Term Goals: Week 3: SLP Short Term Goal 1 (Week 3): Patient will demonstrate functional problem solving for mildly complex tasks with Min A verbal cues.  SLP Short Term Goal 2 (Week 3): Patient will recall new and functional information with Min A verbal cues.  SLP Short Term Goal 3 (Week 3): Patient will self-monitor and correct errors during tasks with Min A verbal cues.  SLP Short Term Goal 4 (Week 3): Patient will demonstrate alternating attention between two functional tasks for ~15 Minutes with Mod A verbal cues.  Skilled Therapeutic Interventions: Skilled treatment session focused on cognitive goals. SLP facilitated session by providing Min A verbal cues for problem solving during mildly complex money management, medication management and reading comprehension tasks. Pt demonstrated alternating attention between two tasks for ~15 minutes with Min A verbal cues. Pt required Mod A verbal cues for problem solving with transfer from wheelchair to bed due to impulsivity. Pt left supine in bed with alarm on, all needs within reach, and sister and brother-in-law present. Continue current plan of care.    Function:  Cognition Comprehension Comprehension assist level: Understands basic 90% of the time/cues < 10% of the time  Expression   Expression assist level: Expresses basic 90% of the time/requires cueing < 10% of the time.  Social Interaction Social Interaction assist level: Interacts appropriately 90% of the time - Needs monitoring or encouragement for participation or interaction.  Problem Solving Problem solving assist level: Solves basic 75 - 89% of the time/requires cueing 10 - 24% of the time  Memory Memory assist level: Recognizes or recalls 75 -  89% of the time/requires cueing 10 - 24% of the time    Pain Pain Assessment Pain Assessment: No/denies pain  Therapy/Group: Individual Therapy  Roxan HockeyYahui Korrina Zern  SLP - Student 12/29/2017, 4:02 PM

## 2017-12-29 NOTE — Plan of Care (Signed)
  RH SKIN INTEGRITY RH STG MAINTAIN SKIN INTEGRITY WITH ASSISTANCE Description STG Maintain Skin Integrity With Mod Assistance.  12/29/2017 1514 - Progressing by Thurston HoleBennett, Stehanie Ekstrom, RN   RH SAFETY RH STG ADHERE TO SAFETY PRECAUTIONS W/ASSISTANCE/DEVICE Description STG Adhere to Safety Precautions With mod  Assistance/Device.  12/29/2017 1514 - Progressing by Thurston HoleBennett, Simeon Vera, RN

## 2017-12-29 NOTE — Progress Notes (Signed)
Social Work Patient ID: Jaime Pierce, male   DOB: Mar 30, 1946, 72 y.o.   MRN: [id number]   Met with pt and wife this afternoon to review team conference.  We discussed continued target d/c 2/16 and, like Dr. Naaman Plummer this morning, discussed what could possibly justify an extension (which is wife's hope).  Wife states, "If he can just walk. I need him to be able to walk.  I don't know that the wheelchair will fit through the house."  We also discussed wife's concerns about effect that depakote might be having on pt's alertness and she wonders if it would be possible to change back to Keppra.  I have passed this question/ concern on to Marlowe Sh[place], [place] who will discuss with MD.   Pt expresses his own frustration with limited improvement in overall function and feels he is "not doing as good as I hoped..."  Attempted to encourage pt but also speak realistically about result of further damage with this stroke.  Will continue to follow for support and d/c planning.  Caliph Borowiak, LCSW

## 2017-12-29 NOTE — Progress Notes (Signed)
[place] PHYSICAL MEDICINE & REHABILITATION     PROGRESS NOTE    Subjective/Complaints: Patient doing fairly well this morning.  Still feels generally a bit more fatigued  ROS: pt denies nausea, vomiting, diarrhea, cough, shortness of breath or chest pain   Objective: Vital Signs: Blood pressure 134/72, pulse 72, temperature 98.5 F (36.9 C), temperature source Oral, resp. rate 16, height 6' (1.829 m), weight 74.7 kg (164 lb 10.9 oz), SpO2 94 %. No results found. No results for input(s): WBC, HGB, HCT, PLT in the last 72 hours. No results for input(s): NA, K, CL, GLUCOSE, BUN, CREATININE, CALCIUM in the last 72 hours.  Invalid input(s): CO CBG (last 3)  No results for input(s): GLUCAP in the last 72 hours.  Wt Readings from Last 3 Encounters:  12/22/17 74.7 kg (164 lb 10.9 oz)  12/07/17 81.4 kg (179 lb 7.3 oz)  12/03/17 79.8 kg (176 lb)    Physical Exam:  Constitutional: He isoriented to person, place, and time.Mood is a bit flat but appropriate HENT:  Head:Normocephalic.  Eyes:EOMI Neck:Normal range of motion.Neck supple. Cards: RRR without murmur. No JVD              Respiratory: Normal effort    WU:JWJX.Bowel sounds are normal. He exhibitsno distension Skin.  Resolving back rash Musc: Mild left AC joint tenderness.  Right knee tenderness. Neurological: He is  oriented to person, place, and time.  RUE:  3 out of 5 right deltoid biceps triceps--with delayed initiation. Wrist essentially 2+ to 3- out of 5. mAS 2/4 biceps, pectoralis muscles. Right lower extremity grossly 3 to 3+ out of 5 proximal distal with difficulty and motor sequencing. MAS   1+ to 2 out of 4right hamstrings.  Able to feed self with left hand using extra time.    Movements and awareness of left side better. Left lower extremity grossly  4 out of 5 proximal to distal.  Sitting balance remains good.  Has difficulty engaging right arm largely due to tone Psych: Pleasant and  engaging      Assessment/Plan: 1.  Functional deficits secondary to intracranial hemorrhage which require 3+ hours per day of interdisciplinary therapy in a comprehensive inpatient rehab setting. Physiatrist is providing close team supervision and 24 hour management of active medical problems listed below. Physiatrist and rehab team continue to assess barriers to discharge/monitor patient progress toward functional and medical goals.  Function:  Bathing Bathing position Bathing activity did not occur: N/A Position: Shower  Bathing parts Body parts bathed by patient: Right arm, Chest, Abdomen, Front perineal area, Right upper leg, Left upper leg Body parts bathed by helper: Left arm, Buttocks, Right lower leg, Left lower leg  Bathing assist Assist Level: 2 helpers(+2 A to wash buttocks while OT rolls pt )      Upper Body Dressing/Undressing Upper body dressing Upper body dressing/undressing activity did not occur: N/A What is the patient wearing?: Pull over shirt/dress     Pull over shirt/dress - Perfomed by patient: Thread/unthread left sleeve Pull over shirt/dress - Perfomed by helper: Thread/unthread right sleeve, Put head through opening, Pull shirt over trunk        Upper body assist Assist Level: Touching or steadying assistance(Pt > 75%)      Lower Body Dressing/Undressing Lower body dressing Lower body dressing/undressing activity did not occur: N/A What is the patient wearing?: Underwear, Pants, [employer], Shoes   Underwear - Performed by helper: Thread/unthread right underwear leg, Thread/unthread left underwear leg  Pants- Performed by helper: Thread/unthread right pants leg, Thread/unthread left pants leg, Pull pants up/down   Non-skid slipper socks- Performed by helper: Don/doff right sock, Don/doff left sock   Socks - Performed by helper: Don/doff right sock, Don/doff left sock   Shoes - Performed by helper: Don/doff right shoe, Don/doff left shoe, Fasten  left, Fasten right       TED Hose - Performed by helper: Don/doff right TED hose, Don/doff left TED hose  Lower body assist Assist for lower body dressing: 2 Helpers      Toileting Toileting Toileting activity did not occur: No continent bowel/bladder event   Toileting steps completed by helper: Adjust clothing prior to toileting, Performs perineal hygiene, Adjust clothing after toileting Toileting Assistive Devices: Other (comment)(stedy)  Toileting assist Assist level: Two helpers   Transfers Chair/bed transfer Chair/bed transfer activity did not occur: Safety/medical concerns Chair/bed transfer method: Stand pivot Chair/bed transfer assist level: Moderate assist (Pt 50 - 74%/lift or lower) Chair/bed transfer assistive device: Armrests Mechanical lift: Stedy   Locomotion Ambulation Ambulation activity did not occur: Safety/medical concerns   Max distance: 5910ft Assist level: Moderate assist (Pt 50 - 74%)   Wheelchair Wheelchair activity did not occur: Safety/medical concerns Type: Manual Max wheelchair distance: 10 ft Assist Level: Touching or steadying assistance (Pt > 75%)(B LEs)  Cognition Comprehension Comprehension assist level: Understands basic 75 - 89% of the time/ requires cueing 10 - 24% of the time  Expression Expression assist level: Expresses basic 75 - 89% of the time/requires cueing 10 - 24% of the time. Needs helper to occlude trach/needs to repeat words.  Social Interaction Social Interaction assist level: Interacts appropriately 90% of the time - Needs monitoring or encouragement for participation or interaction.  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 PT, OT, SLP,      -Reviewed discharge date with patient and wife.  She is interested in lengthening his stay.  I told her that if we see some changes in a positive manner that would justify reaching new goals or changing moles then potentially extension could be a possibility.  However given his current neurological status that likelihood is not great and a week or even 2 weeks but not make a substantial difference. 2. DVT Prophylaxis/Anticoagulation: SCDs. Monitor for any signs of DVT 3. Pain Management:    -mild left RTC syndrome, AC jt arthritis   -He has chronic right knee pain due to osteoarthritis   -Continue Voltaren gel to both areas. Ice to right knee    -Injected right knee 1/22 with improvement.          --gabapentin QHS for dysesthesias   -hasn't tolerated higher doses of baclofen and gabapentin 4. Mood:Provide emotional support 5. Neuropsych: This patientiscapable of making decisions on hisown behalf. 6. Skin/Wound Care:    -prn hydrocortisone cream Benadryl and hypoallergenic sheets   Resolved 7. Fluids/Electrolytes/Nutrition:Labs today are pending. 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 EEG 11/10/2017 negative. Initially on Keppra changed to Depakote 500 mg twice  a day 01/15/2019as well as Vimpat 100 mg twice a day   -No recurrent seizures on rehab   -VPA level is 44.  Given his tolerance of the meds I am hesitant to increase this.  He has had no further seizure activity either.     9.Hypertension. No current antihypertensive medication. Monitor with increased mobility Vitals:   12/28/17 0806 12/28/17 1612  BP: 131/71 134/72  Pulse: 85 72  Resp: 18 16  Temp: 97.6 F (36.4 C) 98.5 F (36.9 C)  SpO2: 94% 94%  Blood pressures improved  this morning 10.COPD with history of tobacco abuse in the past. Continue Dulera, no breathing difficulties 11. Spasticity: Continue baclofen 20 mg 3 times daily.  Will perform Botox injections at the office on 01/10/2018   LOS (Days) 19 A FACE TO FACE EVALUATION WAS PERFORMED  Ranelle Oyster, MD 12/29/2017 8:58 AM

## 2017-12-30 ENCOUNTER — Inpatient Hospital Stay (HOSPITAL_COMMUNITY): Payer: Medicare HMO | Admitting: Speech Pathology

## 2017-12-30 ENCOUNTER — Inpatient Hospital Stay (HOSPITAL_COMMUNITY): Payer: Medicare HMO

## 2017-12-30 ENCOUNTER — Inpatient Hospital Stay (HOSPITAL_COMMUNITY): Payer: Medicare HMO | Admitting: Physical Therapy

## 2017-12-30 NOTE — Progress Notes (Signed)
Physical Therapy Note  Patient Details  Name: Jaime FuelRichard A Hafer MRN: [id number] Date of Birth: 12/13/1945 Today's Date: 12/30/2017 1300-1415, 75 min indiviidual tx Pain: none per pt   This note written with input from Rhodia Albrightaylor McFadden, SPT.  Pt  sitting EOB upon therapist arrival. Squat pivot transfer B using slide board with max assist bed>w/c, w/c<>Nustep and plinth. Pt having trouble shifting weight forward for preparation of squat<>pivot and sit<>stand. Neuromuscular reeducation using the Nustep for reciprocal stepping and dissociation of the trunk from pelvis for 5 min, unable to use the R hand due to high tone of RUE and pain in the R shoulder. NM reeducation also involved gait 1x25 feet mod A using wall hand rail on L side. Ambulation using a weighted grocery cart 1x20 feet, 1x30 feet with mod A + 2 utilized for NM reeducation of weight shifting, limb advancement, and SLS. Second person needed to steer grocery cart. For ambulation pt required visual, tactile cueing using a 1x4 board between feet to educate on scissoring gait on the R LE, and tactile cueing to advance R LE. Pt also required verbal cueing to maintain erect posture, increase step length, and not to step on the board. Pelvic/trunk dissociation exercises utilized to facilitate neuromuscular reeducation and motor control of the pelvis B for bed mobility, transfers, and sit<>stand. Pt worked on weight shifting in sitting with facilitation of pelvic protraction and retraction x 8.Tactile cueing over the ASIS was needed to facilitate proper pelvic rotation and muscle activation.    While therapist pushed w/c to and from gym, pt held B isometric quad contraction for strength and endurance, requiring verbal cueing to keep the feet off of the floor and to not support R foot with the L foot.  Therapist educated wife, [name] on level slide board transfers from w/c <> soft plinth. [name] educated on w/c and board setup, appropriate commands, and  head/hips relationship during transfers.   Pt left resting in w/c with wife in attendance. Wife has been thoroughly trained in fall risks for pt.  See functional tools for status.  Amarionna Arca 12/30/2017, 1:14 PM

## 2017-12-30 NOTE — Progress Notes (Signed)
Occupational Therapy Session Note  Patient Details  Name: Jaime Pierce MRN: [id number] Date of Birth: 1946/07/20  Today's Date: 12/30/2017 OT Individual Time: 0900-1015 OT Individual Time Calculation (min): 75 min    Short Term Goals: Week 2:  OT Short Term Goal 1 (Week 2): Pt will don pullover shirt with mod A OT Short Term Goal 2 (Week 2): Pt will perform sit<>stand with mod A in preparation for pulling up pants OT Short Term Goal 3 (Week 2): Pt will perform grooming tasks with min A OT Short Term Goal 4 (Week 2): Pt will perfomr BSC transfer with mod A  Skilled Therapeutic Interventions/Progress Updates:    Pt sitting EOB upon arrival with wife beside him.  Pt expressed interest in shaving and transferred to w/c with max A for squat pivot.  Pt applied shaving cream and appropriately shaved approx 75% of face without assistance.  Pt required assistance for contralateral side of face and thoroughness on difficult areas.  Discussed bathroom layout at home and discussed options.  [name] states that she really wants pt to be able to transfer to toilet from transport chair. Will continue to address.  Pt transitioned to gym and transferred to mat with mod A.  Focus on sit<>stand from elevated height and height equal to w/c.  Pt performed sit<>stand X 6 with min A/steady A and max verbal cues for positioning/sequencing with increased proprioception provided through pt's R knee. Pt requires increased assistance with stand>sit.  Pt transferred back to w/c and returned to room. Pt remained in w/c with all needs within reach and wife present.   Therapy Documentation Precautions:  Precautions Precautions: Fall Precaution Comments: impaired procioceptive awareness bil.  Restrictions Weight Bearing Restrictions: No Pain: Pain Assessment Pain Assessment: No/denies pain  See Function Navigator for Current Functional Status.   Therapy/Group: Individual Therapy  Rich BraveLanier, Alieah Brinton  Chappell 12/30/2017, 11:28 AM

## 2017-12-30 NOTE — Progress Notes (Signed)
[place] PHYSICAL MEDICINE & REHABILITATION     PROGRESS NOTE    Subjective/Complaints: Had some urinary incontinence overnight.  Denies any new issues otherwise.  ROS: pt denies nausea, vomiting, diarrhea, cough, shortness of breath or chest pain    Objective: Vital Signs: Blood pressure (!) 147/70, pulse 65, temperature 97.7 F (36.5 C), temperature source Oral, resp. rate 16, height 6' (1.829 m), weight 74.7 kg (164 lb 10.9 oz), SpO2 93 %. No results found. No results for input(s): WBC, HGB, HCT, PLT in the last 72 hours. No results for input(s): NA, K, CL, GLUCOSE, BUN, CREATININE, CALCIUM in the last 72 hours.  Invalid input(s): CO CBG (last 3)  No results for input(s): GLUCAP in the last 72 hours.  Wt Readings from Last 3 Encounters:  12/22/17 74.7 kg (164 lb 10.9 oz)  12/07/17 81.4 kg (179 lb 7.3 oz)  12/03/17 79.8 kg (176 lb)    Physical Exam:  Constitutional: He isoriented to person, place, and time.Mood is a bit flat but appropriate HENT:  Head:Normocephalic.  Eyes:EOMI Neck:Normal range of motion.Neck supple. Cards:RRR without murmur. No JVD               Respiratory: normal effort    ON:GEXB.Bowel sounds are normal. He exhibitsno distension Skin.  Rash resolved, skin intact Musc: Mild left AC joint tenderness.  Right knee tenderness. Neurological: He is  oriented to person, place, and time.  RUE:  3 out of 5 right deltoid biceps triceps--with delayed initiation. Wrist essentially 2+ to 3- out of 5. mAS 2/4 biceps, pectoralis muscles. Right lower extremity grossly 3 to 3+ out of 5 proximal distal with difficulty and motor sequencing. MAS   1+ to 2 out of 4right hamstrings.  Able to feed self with left hand using extra time.    Movements and awareness of left side better. Left lower extremity grossly  4 out of 5 proximal to distal. Motor and sensory exam stable  Psych: Pleasant and engaging      Assessment/Plan: 1.  Functional deficits  secondary to intracranial hemorrhage which require 3+ hours per day of interdisciplinary therapy in a comprehensive inpatient rehab setting. Physiatrist is providing close team supervision and 24 hour management of active medical problems listed below. Physiatrist and rehab team continue to assess barriers to discharge/monitor patient progress toward functional and medical goals.  Function:  Bathing Bathing position Bathing activity did not occur: N/A Position: Shower  Bathing parts Body parts bathed by patient: Right arm, Chest, Abdomen, Front perineal area, Right upper leg, Left upper leg Body parts bathed by helper: Left arm, Buttocks, Right lower leg, Left lower leg  Bathing assist Assist Level: 2 helpers(+2 A to wash buttocks while OT rolls pt )      Upper Body Dressing/Undressing Upper body dressing Upper body dressing/undressing activity did not occur: N/A What is the patient wearing?: Pull over shirt/dress     Pull over shirt/dress - Perfomed by patient: Thread/unthread left sleeve Pull over shirt/dress - Perfomed by helper: Thread/unthread right sleeve, Put head through opening, Pull shirt over trunk        Upper body assist Assist Level: Touching or steadying assistance(Pt > 75%)      Lower Body Dressing/Undressing Lower body dressing Lower body dressing/undressing activity did not occur: N/A What is the patient wearing?: Underwear, Pants, [employer], Shoes   Underwear - Performed by helper: Thread/unthread right underwear leg, Thread/unthread left underwear leg   Pants- Performed by helper: Thread/unthread right pants leg,  Thread/unthread left pants leg, Pull pants up/down   Non-skid slipper socks- Performed by helper: Don/doff right sock, Don/doff left sock   Socks - Performed by helper: Don/doff right sock, Don/doff left sock   Shoes - Performed by helper: Don/doff right shoe, Don/doff left shoe, Fasten left, Fasten right       TED Hose - Performed by helper:  Don/doff right TED hose, Don/doff left TED hose  Lower body assist Assist for lower body dressing: 2 Helpers      Toileting Toileting Toileting activity did not occur: No continent bowel/bladder event   Toileting steps completed by helper: Adjust clothing prior to toileting, Performs perineal hygiene, Adjust clothing after toileting Toileting Assistive Devices: Other (comment)(stedy)  Toileting assist Assist level: Two helpers   Transfers Chair/bed transfer Chair/bed transfer activity did not occur: Safety/medical concerns Chair/bed transfer method: Squat pivot Chair/bed transfer assist level: Moderate assist (Pt 50 - 74%/lift or lower) Chair/bed transfer assistive device: Armrests Mechanical lift: Stedy   Locomotion Ambulation Ambulation activity did not occur: Safety/medical concerns   Max distance: 225ft Assist level: Moderate assist (Pt 50 - 74%)   Wheelchair Wheelchair activity did not occur: Safety/medical concerns Type: Manual Max wheelchair distance: 10 ft Assist Level: Touching or steadying assistance (Pt > 75%)(B LEs)  Cognition Comprehension Comprehension assist level: Understands basic 90% of the time/cues < 10% of the time  Expression Expression assist level: Expresses basic 90% of the time/requires cueing < 10% of the time.  Social Interaction Social Interaction assist level: Interacts appropriately 90% of the time - Needs monitoring or encouragement for participation or interaction.  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 PT, OT, SLP,       2. DVT Prophylaxis/Anticoagulation: SCDs. Monitor for any signs of DVT 3. Pain Management:    -mild left RTC syndrome, AC jt arthritis   -He has chronic right knee pain due to osteoarthritis   -Continue Voltaren gel to both areas. Ice to right knee    -Injected right knee 1/22 with improvement.          --gabapentin QHS for dysesthesias   -hasn't tolerated higher doses of baclofen and gabapentin 4. Mood:Provide emotional support 5. Neuropsych: This patientiscapable of making decisions on hisown behalf. 6. Skin/Wound Care:    -prn hydrocortisone cream Benadryl and hypoallergenic sheets   Resolved 7. Fluids/Electrolytes/Nutrition:Labs today are pending. 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 EEG 11/10/2017 negative. Initially on Keppra changed to Depakote 500 mg twice a day 01/15/2019as well as Vimpat 100 mg twice a day   -No recurrent seizures on rehab   -VPA level is 44.  Given his tolerance of medications I would not increase his Depakote at this point.     9.Hypertension. No current antihypertensive medication. Monitor with increased mobility Vitals:   12/29/17 2042 12/30/17 0212  BP:  (!) 147/70  Pulse:  65  Resp:  16  Temp:  97.7 F (36.5 C)  SpO2: 94% 93%  Blood pressures improved this morning 10.COPD with history of  tobacco abuse in the past. Continue Dulera, no breathing difficulties 11. Spasticity: Continue baclofen 20 mg 3 times daily.  Will perform Botox injections at the office on 01/10/2018   LOS (Days) 20 A FACE TO FACE EVALUATION WAS PERFORMED  Ranelle Oyster, MD 12/30/2017 8:38 AM

## 2017-12-30 NOTE — Progress Notes (Signed)
Occupational Therapy Weekly Progress Note  Patient Details  Name: Jaime Pierce MRN: [id number] Date of Birth: 08-08-1946  Beginning of progress report period: December 21, 2017 End of progress report period: December 30, 2017  Patient has met 3 of 4 short term goals.  Pt progress been steady but slow since previous progress report.  Pt continues to require max multimodal cues for transitional movements/transfers.  Pt requires more than a reasonable amount of time to complete bathing/dressing tasks.  Pt requires mod A for bathing tasks seated in shower and mod A for UB dressing tasks/max A for LB dressing tasks.  Pt requires mod A/max A for squat pivot transfers.  Pt continues to demonstrate some apraxia but with time is able to initiate most BADL tasks although requiring assistance to complete. [name] has been present for therapy session.  [name] expressed desire for pt to be able to transfer to toilet in bathroom and states she is willing to purchase Stedy if needed to facilitate transfer.    Patient continues to demonstrate the following deficits: muscle weakness and muscle joint tightness, decreased cardiorespiratoy endurance, impaired timing and sequencing, abnormal tone, unbalanced muscle activation, motor apraxia, decreased coordination and decreased motor planning, decreased midline orientation, decreased attention to right, decreased motor planning and ideational apraxia, decreased initiation, decreased attention, decreased awareness, decreased problem solving, decreased safety awareness, decreased memory and delayed processing and decreased standing balance, decreased postural control, hemiplegia and decreased balance strategies and therefore will continue to benefit from skilled OT intervention to enhance overall performance with BADL.  Patient progressing toward long term goals..  Continue plan of care.  OT Short Term Goals Week 2:  OT Short Term Goal 1 (Week 2): Pt will don  pullover shirt with mod A OT Short Term Goal 1 - Progress (Week 2): Met OT Short Term Goal 2 (Week 2): Pt will perform sit<>stand with mod A in preparation for pulling up pants OT Short Term Goal 2 - Progress (Week 2): Met OT Short Term Goal 3 (Week 2): Pt will perform grooming tasks with min A OT Short Term Goal 3 - Progress (Week 2): Met OT Short Term Goal 4 (Week 2): Pt will perfomr BSC transfer with mod A OT Short Term Goal 4 - Progress (Week 2): Progressing toward goal Week 3:  OT Short Term Goal 1 (Week 3): STG=LTG secondary to ELOS     Therapy Documentation Precautions:  Precautions Precautions: Fall Precaution Comments: impaired procioceptive awareness bil.  Restrictions Weight Bearing Restrictions: No  See Function Navigator for Current Functional Status.     Leotis Shames [facility] 12/30/2017, 3:20 PM

## 2017-12-30 NOTE — Progress Notes (Signed)
Speech Language Pathology Weekly Progress and Session Note  Patient Details  Name: Jaime Pierce MRN: [id number] Date of Birth: 02/13/1946  Beginning of progress report period: December 24, 2017 End of progress report period: December 30, 2017  Today's Date: 12/30/2017 SLP Individual Time: 1415-1500 SLP Individual Time Calculation (min): 45 min  Short Term Goals: Week 3: SLP Short Term Goal 1 (Week 3): Patient will demonstrate functional problem solving for mildly complex tasks with Min A verbal cues.  SLP Short Term Goal 1 - Progress (Week 3): Not met SLP Short Term Goal 2 (Week 3): Patient will recall new and functional information with Min A verbal cues.  SLP Short Term Goal 2 - Progress (Week 3): Not met SLP Short Term Goal 3 (Week 3): Patient will self-monitor and correct errors during tasks with Min A verbal cues.  SLP Short Term Goal 3 - Progress (Week 3): Met SLP Short Term Goal 4 (Week 3): Patient will demonstrate alternating attention between two functional tasks for ~15 Minutes with Mod A verbal cues. SLP Short Term Goal 4 - Progress (Week 3): Met    New Short Term Goals: Week 4: SLP Short Term Goal 1 (Week 4): Patient will demonstrate functional problem solving for mildly complex tasks with Min A verbal cues.  SLP Short Term Goal 2 (Week 4): Patient will recall new and functional information with Min A verbal cues.  SLP Short Term Goal 3 (Week 4): Patient will self-monitor and correct errors during tasks with supervision verbal cues.  SLP Short Term Goal 4 (Week 4): Patient will demonstrate alternating attention between two functional tasks for ~30 Minutes with Min A verbal cues.  Weekly Progress Updates: Pt continues to make functional gains and has met 2 out of 4 STGs this reporting period. Currently, pt requires overall Min A verbal cues for self-monitoring and correction of errors during functional tasks and alternating attention between two functional tasks for ~15  Minutes. Pt is making progress towards recall and problem solving goals with mildly complex tasks; however, goals are currently not met due to inconsistency during tasks. Pt would benefit from continued SLP intervention in order to optimize his cognitive function and overall functional independence prior to discharge. Patient and family education is ongoing.   Intensity: Minumum of 1-2 x/day, 30 to 90 minutes Frequency: 3 to 5 out of 7 days Duration/Length of Stay: 01/08/18 Treatment/Interventions: Cognitive remediation/compensation;Cueing hierarchy;Functional tasks;Internal/external aids;Patient/family education;Therapeutic Activities;Environmental controls   Daily Session  Skilled Therapeutic Interventions: Skilled treatment session focused on cognitive goals. SLP facilitated session by providing Min A verbal cues for problem solving in regards to self-monitoring and correcting of errors during two mildly complex tasks. Pt demonstrated alternating attention between two tasks for ~15 minutes with Supervision verbal cues. Pt left upright in wheelchair with RN to consume medicine with wife present. Continue with current plan of care.     Function:   Cognition Comprehension Comprehension assist level: Understands basic 90% of the time/cues < 10% of the time  Expression   Expression assist level: Expresses basic 90% of the time/requires cueing < 10% of the time.  Social Interaction Social Interaction assist level: Interacts appropriately 90% of the time - Needs monitoring or encouragement for participation or interaction.  Problem Solving Problem solving assist level: Solves basic 75 - 89% of the time/requires cueing 10 - 24% of the time  Memory Memory assist level: Recognizes or recalls 75 - 89% of the time/requires cueing 10 - 24% of the time  Pain Pain Assessment Pain Assessment: No/denies pain  Therapy/Group: Individual Therapy  Meredeth Ide  SLP - Student 12/30/2017, 4:22  PM

## 2017-12-31 ENCOUNTER — Inpatient Hospital Stay (HOSPITAL_COMMUNITY): Payer: Medicare HMO

## 2017-12-31 ENCOUNTER — Inpatient Hospital Stay (HOSPITAL_COMMUNITY): Payer: Medicare HMO | Admitting: Physical Therapy

## 2017-12-31 ENCOUNTER — Inpatient Hospital Stay (HOSPITAL_COMMUNITY): Payer: Medicare HMO | Admitting: Speech Pathology

## 2017-12-31 ENCOUNTER — Ambulatory Visit: Payer: Medicare HMO | Admitting: Physical Therapy

## 2017-12-31 NOTE — Progress Notes (Signed)
Occupational Therapy Session Note  Patient Details  Name: Delora FuelRichard A Matsumura MRN: [id number] Date of Birth: [birth year]  Today's Date: 12/31/2017 OT Individual Time: 0900-1000 OT Individual Time Calculation (min): 60 min    Short Term Goals: Week 3:  OT Short Term Goal 1 (Week 3): STG=LTG secondary to ELOS  Skilled Therapeutic Interventions/Progress Updates:    OT intervention with focus on bed mobility, sit<>stand, standing balance, functional transfers, and BADL retraining to increase independence with BADLs.  Pt engaged in bathing at shower level and dressing with sit<>stand from w/c. Pt transferred to shower via Stedy and completed bathing tasks seated on tub bench.  Pt able to wash hair this morning.  Pt initiated use of RUE to assist with bathing tasks but required [facility] assistance.  Pt performed sit<>stand in [place] with min A.  Pt able to thread BLE into underpants and shorts this morning but required assistance with pulling over hips.  Pt exhibited significant difficulty with donning shirt.  Pt recalled techniques but exhibited difficulty with orientation of shirt. Pt remained in w/c with wife present.   Therapy Documentation Precautions:  Precautions Precautions: Fall Precaution Comments: impaired procioceptive awareness bil.  Restrictions Weight Bearing Restrictions: No  Pain:  Pt denies pain  See Function Navigator for Current Functional Status.   Therapy/Group: Individual Therapy  Rich BraveLanier, [place] Boys Chappell 12/31/2017, 12:13 PM

## 2017-12-31 NOTE — Progress Notes (Signed)
Physical Therapy Session Note  Patient Details  Name: Jaime Pierce MRN: [id number] Date of Birth: 1946-10-31  Today's Date: 12/31/2017 PT Individual Time: 1300-1400 PT Individual Time Calculation (min): 60 min   Short Term Goals: Week 2:  PT Short Term Goal 1 (Week 2): Pt will ambulate 25 feet using LRAD mod A +1  PT Short Term Goal 1 - Progress (Week 2): Updated due to goal met  Skilled Therapeutic Interventions/Progress Updates: pt received seated in w/c, denies pain and agreeable to treatment. Transported to/from gym totalA in w/c. Performed gait with L hall rail and modA x25' forward with tactile cues for RLE abduction with stepping to reduce scissoring, tactile cues for RLE weight bearing in stance. Backwards walking x25' with hall rail for RLE coordination, AROM to gastroc/hip flexors, glute strengthening. Side stepping R/L x10 each direction; maxA for RLE placement and weight bearing in stance. Side stepping R/L over agility ladder for visual cues for foot placement; continues to require max cues and assist for placement, RLE weight bearing to reduce shuffling/scooting feet, and upright posture. Lateral step ups RLE to 3" step with mod/max tactile cues for hip/knee extension. Attempted trial in parallel bars to 6" step however unable to complete with appropriate technique even with maxA. Remained seated in w/c at end of session, returned to room by wife totalA.      Therapy Documentation Precautions:  Precautions Precautions: Fall Precaution Comments: impaired procioceptive awareness bil.  Restrictions Weight Bearing Restrictions: No   See Function Navigator for Current Functional Status.   Therapy/Group: Individual Therapy  Luberta Mutter 12/31/2017, 4:49 PM

## 2017-12-31 NOTE — Discharge Instructions (Signed)
Inpatient Rehab Discharge Instructions  Jaime FuelRichard A Pierce Discharge date and time: No discharge date for patient encounter.   Activities/Precautions/ Functional Status: Activity: activity as tolerated Diet: regular diet Wound Care: none needed Functional status:  ___ No restrictions     ___ Walk up steps independently ___ 24/7 supervision/assistance   ___ Walk up steps with assistance ___ Intermittent supervision/assistance  ___ Bathe/dress independently ___ Walk with walker     _x__ Bathe/dress with assistance ___ Walk Independently    ___ Shower independently ___ Walk with assistance    ___ Shower with assistance ___ No alcohol     ___ Return to work/school ________  Special Instructions: NO driving   My questions have been answered and I understand these instructions. I will adhere to these goals and the provided educational materials after my discharge from the hospital.  Patient/Caregiver Signature _______________________________ Date __________  Clinician Signature _______________________________________ Date __________  Please bring this form and your medication list with you to all your follow-up doctor's appointments.

## 2017-12-31 NOTE — Progress Notes (Signed)
Occupational Therapy Session Note  Patient Details  Name: Jaime Pierce MRN: [id number] Date of Birth: 07-24-1946  Today's Date: 12/31/2017 OT Individual Time: 0254-8628 OT Individual Time Calculation (min): 30 min    Short Term Goals: Week 2:  OT Short Term Goal 1 (Week 2): Pt will don pullover shirt with mod A OT Short Term Goal 1 - Progress (Week 2): Met OT Short Term Goal 2 (Week 2): Pt will perform sit<>stand with mod A in preparation for pulling up pants OT Short Term Goal 2 - Progress (Week 2): Met OT Short Term Goal 3 (Week 2): Pt will perform grooming tasks with min A OT Short Term Goal 3 - Progress (Week 2): Met OT Short Term Goal 4 (Week 2): Pt will perfomr BSC transfer with mod A OT Short Term Goal 4 - Progress (Week 2): Progressing toward goal  Skilled Therapeutic Interventions/Progress Updates:    1;1. Pt and wife present during session discussing bathroom set up/toileting and toilet transfers. Wife weight options of buying stedy for toileting. OT invites wife to practice with pt to understand burden of care nearing towards DC. Wife able to safely complete/cue patient through transfer w/c>BSC>EOB with supervision with cueing from OT to have pt scoot to edge of chair prior to standing for improved body mechanics and having pt sit on flaps prior to unlock breaks to wheel into bathroom. Pt wife educated with the fact she still needs to call staff to help transfer pt into bathroom with stedy. All parties discuss tub transfer bench as potential option for shower if sliding glass doors are removed and curtain is placed instead since stedy will not be able to roll into shower d/t 5" lip. OT demonstrates set up for patient and wife. Wife willing to take pictures of set up and discuss in future sessions.   Therapy Documentation Precautions:  Precautions Precautions: Fall Precaution Comments: impaired procioceptive awareness bil.  Restrictions Weight Bearing Restrictions:  No  See Function Navigator for Current Functional Status.   Therapy/Group: Individual Therapy  Tonny Branch 12/31/2017, 4:12 PM

## 2017-12-31 NOTE — Progress Notes (Signed)
[facility] PHYSICAL MEDICINE & REHABILITATION     PROGRESS NOTE    Subjective/Complaints: No new problems overnight.  Slow functional progress  ROS: pt denies nausea, vomiting, diarrhea, cough, shortness of breath or chest pain    Objective: Vital Signs: Blood pressure 130/70, pulse 67, temperature (!) 97.5 F (36.4 C), temperature source Oral, resp. rate 16, height 6' (1.829 m), weight 74.7 kg (164 lb 10.9 oz), SpO2 92 %. No results found. No results for input(s): WBC, HGB, HCT, PLT in the last 72 hours. No results for input(s): NA, K, CL, GLUCOSE, BUN, CREATININE, CALCIUM in the last 72 hours.  Invalid input(s): CO CBG (last 3)  No results for input(s): GLUCAP in the last 72 hours.  Wt Readings from Last 3 Encounters:  12/22/17 74.7 kg (164 lb 10.9 oz)  12/07/17 81.4 kg (179 lb 7.3 oz)  12/03/17 79.8 kg (176 lb)    Physical Exam:  Constitutional: He isoriented to person, place, and time.Mood is a bit flat but appropriate HENT:  Head:Normocephalic.  Eyes:EOMI Neck:Normal range of motion.Neck supple. Cards:RRR without murmur. No JVD                Respiratory: normal effort    ZO:XWRU.Bowel sounds are normal. He exhibitsno distension Skin.  Rash resolved, skin intact Musc: Mild left AC joint tenderness.  Right knee tenderness. Neurological: He is  oriented to person, place, and time.  RUE:  3 out of 5 right deltoid biceps triceps--with delayed initiation. Wrist essentially 2+ to 3- out of 5. mAS 2/4 biceps, pectoralis muscles. Right lower extremity grossly 3 to 3+ out of 5 proximal distal with difficulty and motor sequencing. MAS   1+ to 2 out of 4right hamstrings.  Able to feed self with left hand using extra time.    Movements and awareness of left side better. Left lower extremity grossly  4 out of 5 proximal to distal. Tone, motor, sensory exam unchanged today Psych: Pleasant and engaging      Assessment/Plan: 1.  Functional deficits secondary to  intracranial hemorrhage which require 3+ hours per day of interdisciplinary therapy in a comprehensive inpatient rehab setting. Physiatrist is providing close team supervision and 24 hour management of active medical problems listed below. Physiatrist and rehab team continue to assess barriers to discharge/monitor patient progress toward functional and medical goals.  Function:  Bathing Bathing position Bathing activity did not occur: N/A Position: Shower  Bathing parts Body parts bathed by patient: Right arm, Chest, Abdomen, Front perineal area, Right upper leg, Left upper leg Body parts bathed by helper: Left arm, Buttocks, Right lower leg, Left lower leg  Bathing assist Assist Level: 2 helpers(+2 A to wash buttocks while OT rolls pt )      Upper Body Dressing/Undressing Upper body dressing Upper body dressing/undressing activity did not occur: N/A What is the patient wearing?: Pull over shirt/dress     Pull over shirt/dress - Perfomed by patient: Thread/unthread left sleeve Pull over shirt/dress - Perfomed by helper: Thread/unthread right sleeve, Put head through opening, Pull shirt over trunk        Upper body assist Assist Level: Touching or steadying assistance(Pt > 75%)      Lower Body Dressing/Undressing Lower body dressing Lower body dressing/undressing activity did not occur: N/A What is the patient wearing?: Underwear, Pants, [employer], Shoes   Underwear - Performed by helper: Thread/unthread right underwear leg, Thread/unthread left underwear leg   Pants- Performed by helper: Thread/unthread right pants leg, Thread/unthread left  pants leg, Pull pants up/down   Non-skid slipper socks- Performed by helper: Don/doff right sock, Don/doff left sock   Socks - Performed by helper: Don/doff right sock, Don/doff left sock   Shoes - Performed by helper: Don/doff right shoe, Don/doff left shoe, Fasten left, Fasten right       TED Hose - Performed by helper: Don/doff right  TED hose, Don/doff left TED hose  Lower body assist Assist for lower body dressing: 2 Helpers      Toileting Toileting Toileting activity did not occur: No continent bowel/bladder event   Toileting steps completed by helper: Adjust clothing prior to toileting, Performs perineal hygiene, Adjust clothing after toileting Toileting Assistive Devices: Other (comment)(stedy)  Toileting assist Assist level: Touching or steadying assistance (Pt.75%)   Transfers Chair/bed transfer Chair/bed transfer activity did not occur: Safety/medical concerns Chair/bed transfer method: Squat pivot Chair/bed transfer assist level: Moderate assist (Pt 50 - 74%/lift or lower) Chair/bed transfer assistive device: Armrests Mechanical lift: Stedy   Locomotion Ambulation Ambulation activity did not occur: Safety/medical concerns   Max distance: 5625ft Assist level: Moderate assist (Pt 50 - 74%)   Wheelchair Wheelchair activity did not occur: Safety/medical concerns Type: Manual Max wheelchair distance: 10 ft Assist Level: Touching or steadying assistance (Pt > 75%)(B LEs)  Cognition Comprehension Comprehension assist level: Understands basic 90% of the time/cues < 10% of the time  Expression Expression assist level: Expresses basic 90% of the time/requires cueing < 10% of the time.  Social Interaction Social Interaction assist level: Interacts appropriately 90% of the time - Needs monitoring or encouragement for participation or interaction.  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 PT, OT, SLP,      -Progress has been slow due to spasticity as well as motor planning issues 2. DVT Prophylaxis/Anticoagulation: SCDs. Monitor for any signs of DVT 3. Pain Management:    -mild left RTC syndrome, AC jt arthritis   -He has chronic right knee pain due to osteoarthritis   -Continue Voltaren gel to both areas. Ice to right knee    -Injected right knee 1/22 with improvement.  Do not believe that pain in the right knee is as much of a problem as his tone is in the hamstrings          --gabapentin QHS for dysesthesias   -hasn't tolerated higher doses of baclofen and gabapentin 4. Mood:Provide emotional support 5. Neuropsych: This patientiscapable of making decisions on hisown behalf. 6. Skin/Wound Care:    -prn hydrocortisone cream Benadryl and hypoallergenic sheets   Resolved 7. Fluids/Electrolytes/Nutrition:Labs today are pending. 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 EEG 11/10/2017 negative. Initially on Keppra changed to Depakote 500 mg twice a day 01/15/2019as well as Vimpat 100 mg twice a day   -No recurrent seizures on rehab   -VPA level is 44.  Given his tolerance of medications I would not increase his Depakote at this point.     9.Hypertension. No current antihypertensive medication. Monitor with increased mobility Vitals:   12/30/17 1700  12/31/17 0451  BP: 122/75 130/70  Pulse: 67 67  Resp: 16 16  Temp: 97.6 F (36.4 C) (!) 97.5 F (36.4 C)  SpO2: 94% 92%  Blood pressures improved this morning 10.COPD with history of tobacco abuse in the past. Continue Dulera, no breathing difficulties 11. Spasticity: Continue baclofen 20 mg 3 times daily.  Will perform Botox injections at the office on 01/10/2018     -in the meantime maintain aggressive range of motion and orthotics.   LOS (Days) 21 A FACE TO FACE EVALUATION WAS PERFORMED  Ranelle Oyster,  MD 12/31/2017 9:16 AM

## 2017-12-31 NOTE — Progress Notes (Signed)
Speech Language Pathology Daily Session Note  Patient Details  Name: Jaime Pierce MRN: [id number] Date of Birth: 1946/11/10  Today's Date: 12/31/2017 SLP Individual Time: 1430-1515 SLP Individual Time Calculation (min): 45 min  Short Term Goals: Week 4: SLP Short Term Goal 1 (Week 4): Patient will demonstrate functional problem solving for mildly complex tasks with Min A verbal cues.  SLP Short Term Goal 2 (Week 4): Patient will recall new and functional information with Min A verbal cues.  SLP Short Term Goal 3 (Week 4): Patient will self-monitor and correct errors during tasks with supervision verbal cues.  SLP Short Term Goal 4 (Week 4): Patient will demonstrate alternating attention between two functional tasks for ~30 Minutes with Min A verbal cues.  Skilled Therapeutic Interventions: Skilled treatment session focused on cognition goals. SLP facilitated session by providing Min A cues to play checkers in a mildly distracting environment. Pt specifically required cues for recall of his own color, forward movement of checkers across board and use of LUE without touching other checkers (only touching his checker in play). Pt was handed off to wife at end of session.      Function:    Cognition Comprehension Comprehension assist level: Understands basic 90% of the time/cues < 10% of the time  Expression   Expression assist level: Expresses basic 90% of the time/requires cueing < 10% of the time.  Social Interaction Social Interaction assist level: Interacts appropriately 90% of the time - Needs monitoring or encouragement for participation or interaction.  Problem Solving Problem solving assist level: Solves basic 75 - 89% of the time/requires cueing 10 - 24% of the time  Memory Memory assist level: Recognizes or recalls 75 - 89% of the time/requires cueing 10 - 24% of the time    Pain    Therapy/Group: Individual Therapy  Jaime Pierce 12/31/2017, 4:48 PM

## 2018-01-01 ENCOUNTER — Inpatient Hospital Stay (HOSPITAL_COMMUNITY): Payer: Medicare HMO

## 2018-01-01 ENCOUNTER — Inpatient Hospital Stay (HOSPITAL_COMMUNITY): Payer: Medicare HMO | Admitting: Occupational Therapy

## 2018-01-01 ENCOUNTER — Inpatient Hospital Stay (HOSPITAL_COMMUNITY)
Admission: AD | Admit: 2018-01-01 | Discharge: 2018-01-21 | DRG: 064 | Disposition: E | Payer: Medicare HMO | Source: Other Acute Inpatient Hospital | Attending: Neurology | Admitting: Neurology

## 2018-01-01 DIAGNOSIS — G936 Cerebral edema: Secondary | ICD-10-CM | POA: Diagnosis not present

## 2018-01-01 DIAGNOSIS — J9690 Respiratory failure, unspecified, unspecified whether with hypoxia or hypercapnia: Secondary | ICD-10-CM | POA: Diagnosis not present

## 2018-01-01 DIAGNOSIS — I611 Nontraumatic intracerebral hemorrhage in hemisphere, cortical: Secondary | ICD-10-CM | POA: Diagnosis not present

## 2018-01-01 DIAGNOSIS — I680 Cerebral amyloid angiopathy: Secondary | ICD-10-CM | POA: Diagnosis not present

## 2018-01-01 DIAGNOSIS — K117 Disturbances of salivary secretion: Secondary | ICD-10-CM | POA: Diagnosis not present

## 2018-01-01 DIAGNOSIS — R0902 Hypoxemia: Secondary | ICD-10-CM

## 2018-01-01 DIAGNOSIS — J988 Other specified respiratory disorders: Secondary | ICD-10-CM | POA: Diagnosis not present

## 2018-01-01 DIAGNOSIS — I10 Essential (primary) hypertension: Secondary | ICD-10-CM | POA: Diagnosis present

## 2018-01-01 DIAGNOSIS — R4020 Unspecified coma: Secondary | ICD-10-CM | POA: Diagnosis not present

## 2018-01-01 DIAGNOSIS — I69151 Hemiplegia and hemiparesis following nontraumatic intracerebral hemorrhage affecting right dominant side: Secondary | ICD-10-CM | POA: Diagnosis not present

## 2018-01-01 DIAGNOSIS — Z8679 Personal history of other diseases of the circulatory system: Secondary | ICD-10-CM

## 2018-01-01 DIAGNOSIS — R509 Fever, unspecified: Secondary | ICD-10-CM

## 2018-01-01 DIAGNOSIS — I615 Nontraumatic intracerebral hemorrhage, intraventricular: Secondary | ICD-10-CM | POA: Diagnosis not present

## 2018-01-01 DIAGNOSIS — R569 Unspecified convulsions: Secondary | ICD-10-CM | POA: Diagnosis not present

## 2018-01-01 DIAGNOSIS — D509 Iron deficiency anemia, unspecified: Secondary | ICD-10-CM | POA: Diagnosis present

## 2018-01-01 DIAGNOSIS — E871 Hypo-osmolality and hyponatremia: Secondary | ICD-10-CM | POA: Diagnosis present

## 2018-01-01 DIAGNOSIS — Z888 Allergy status to other drugs, medicaments and biological substances status: Secondary | ICD-10-CM

## 2018-01-01 DIAGNOSIS — R0603 Acute respiratory distress: Secondary | ICD-10-CM

## 2018-01-01 DIAGNOSIS — J690 Pneumonitis due to inhalation of food and vomit: Secondary | ICD-10-CM | POA: Diagnosis present

## 2018-01-01 DIAGNOSIS — Z7189 Other specified counseling: Secondary | ICD-10-CM | POA: Diagnosis not present

## 2018-01-01 DIAGNOSIS — Z882 Allergy status to sulfonamides status: Secondary | ICD-10-CM

## 2018-01-01 DIAGNOSIS — J90 Pleural effusion, not elsewhere classified: Secondary | ICD-10-CM | POA: Diagnosis not present

## 2018-01-01 DIAGNOSIS — G8194 Hemiplegia, unspecified affecting left nondominant side: Secondary | ICD-10-CM | POA: Diagnosis present

## 2018-01-01 DIAGNOSIS — Z951 Presence of aortocoronary bypass graft: Secondary | ICD-10-CM

## 2018-01-01 DIAGNOSIS — J449 Chronic obstructive pulmonary disease, unspecified: Secondary | ICD-10-CM | POA: Diagnosis not present

## 2018-01-01 DIAGNOSIS — J9600 Acute respiratory failure, unspecified whether with hypoxia or hypercapnia: Secondary | ICD-10-CM

## 2018-01-01 DIAGNOSIS — J9811 Atelectasis: Secondary | ICD-10-CM | POA: Diagnosis not present

## 2018-01-01 DIAGNOSIS — I639 Cerebral infarction, unspecified: Secondary | ICD-10-CM | POA: Diagnosis not present

## 2018-01-01 DIAGNOSIS — Z66 Do not resuscitate: Secondary | ICD-10-CM | POA: Diagnosis not present

## 2018-01-01 DIAGNOSIS — Z8249 Family history of ischemic heart disease and other diseases of the circulatory system: Secondary | ICD-10-CM

## 2018-01-01 DIAGNOSIS — G935 Compression of brain: Secondary | ICD-10-CM

## 2018-01-01 DIAGNOSIS — Z01818 Encounter for other preprocedural examination: Secondary | ICD-10-CM | POA: Diagnosis not present

## 2018-01-01 DIAGNOSIS — R29725 NIHSS score 25: Secondary | ICD-10-CM | POA: Diagnosis present

## 2018-01-01 DIAGNOSIS — Z515 Encounter for palliative care: Secondary | ICD-10-CM

## 2018-01-01 DIAGNOSIS — Z87891 Personal history of nicotine dependence: Secondary | ICD-10-CM

## 2018-01-01 DIAGNOSIS — E785 Hyperlipidemia, unspecified: Secondary | ICD-10-CM | POA: Diagnosis present

## 2018-01-01 DIAGNOSIS — I459 Conduction disorder, unspecified: Secondary | ICD-10-CM | POA: Diagnosis not present

## 2018-01-01 DIAGNOSIS — Z4659 Encounter for fitting and adjustment of other gastrointestinal appliance and device: Secondary | ICD-10-CM

## 2018-01-01 DIAGNOSIS — J9601 Acute respiratory failure with hypoxia: Secondary | ICD-10-CM | POA: Diagnosis not present

## 2018-01-01 DIAGNOSIS — Z4682 Encounter for fitting and adjustment of non-vascular catheter: Secondary | ICD-10-CM

## 2018-01-01 DIAGNOSIS — I2510 Atherosclerotic heart disease of native coronary artery without angina pectoris: Secondary | ICD-10-CM | POA: Diagnosis present

## 2018-01-01 DIAGNOSIS — E854 Organ-limited amyloidosis: Secondary | ICD-10-CM | POA: Diagnosis present

## 2018-01-01 DIAGNOSIS — G4089 Other seizures: Secondary | ICD-10-CM | POA: Diagnosis not present

## 2018-01-01 DIAGNOSIS — Z823 Family history of stroke: Secondary | ICD-10-CM

## 2018-01-01 DIAGNOSIS — I629 Nontraumatic intracranial hemorrhage, unspecified: Secondary | ICD-10-CM | POA: Diagnosis not present

## 2018-01-01 DIAGNOSIS — I619 Nontraumatic intracerebral hemorrhage, unspecified: Secondary | ICD-10-CM | POA: Diagnosis not present

## 2018-01-01 DIAGNOSIS — D72829 Elevated white blood cell count, unspecified: Secondary | ICD-10-CM | POA: Diagnosis not present

## 2018-01-01 DIAGNOSIS — Z452 Encounter for adjustment and management of vascular access device: Secondary | ICD-10-CM | POA: Diagnosis not present

## 2018-01-01 DIAGNOSIS — I610 Nontraumatic intracerebral hemorrhage in hemisphere, subcortical: Secondary | ICD-10-CM | POA: Diagnosis not present

## 2018-01-01 LAB — COMPREHENSIVE METABOLIC PANEL
ALT: 18 U/L (ref 17–63)
ANION GAP: 12 (ref 5–15)
AST: 19 U/L (ref 15–41)
Albumin: 3.2 g/dL — ABNORMAL LOW (ref 3.5–5.0)
Alkaline Phosphatase: 50 U/L (ref 38–126)
BUN: 15 mg/dL (ref 6–20)
CHLORIDE: 103 mmol/L (ref 101–111)
CO2: 23 mmol/L (ref 22–32)
Calcium: 9 mg/dL (ref 8.9–10.3)
Creatinine, Ser: 0.7 mg/dL (ref 0.61–1.24)
Glucose, Bld: 131 mg/dL — ABNORMAL HIGH (ref 65–99)
POTASSIUM: 3.7 mmol/L (ref 3.5–5.1)
SODIUM: 138 mmol/L (ref 135–145)
Total Bilirubin: 0.8 mg/dL (ref 0.3–1.2)
Total Protein: 7 g/dL (ref 6.5–8.1)

## 2018-01-01 LAB — URINALYSIS, COMPLETE (UACMP) WITH MICROSCOPIC
BILIRUBIN URINE: NEGATIVE
Bacteria, UA: NONE SEEN
GLUCOSE, UA: NEGATIVE mg/dL
HGB URINE DIPSTICK: NEGATIVE
Ketones, ur: NEGATIVE mg/dL
LEUKOCYTES UA: NEGATIVE
NITRITE: NEGATIVE
PH: 8 (ref 5.0–8.0)
Protein, ur: NEGATIVE mg/dL
SPECIFIC GRAVITY, URINE: 1.011 (ref 1.005–1.030)
Squamous Epithelial / LPF: NONE SEEN
WBC, UA: NONE SEEN WBC/hpf (ref 0–5)

## 2018-01-01 LAB — LIPID PANEL
Cholesterol: 173 mg/dL (ref 0–200)
HDL: 32 mg/dL — ABNORMAL LOW (ref >40)
LDL CALC: 129 mg/dL — AB (ref 0–99)
Total CHOL/HDL Ratio: 5.4 RATIO
Triglycerides: 58 mg/dL (ref <150)
VLDL: 12 mg/dL (ref 0–40)

## 2018-01-01 LAB — SODIUM
SODIUM: 138 mmol/L (ref 135–145)
Sodium: 140 mmol/L (ref 135–145)

## 2018-01-01 LAB — CBC
HEMATOCRIT: 39.9 % (ref 39.0–52.0)
Hemoglobin: 13.5 g/dL (ref 13.0–17.0)
MCH: 30.9 pg (ref 26.0–34.0)
MCHC: 33.8 g/dL (ref 30.0–36.0)
MCV: 91.3 fL (ref 78.0–100.0)
Platelets: 327 10*3/uL (ref 150–400)
RBC: 4.37 MIL/uL (ref 4.22–5.81)
RDW: 14.5 % (ref 11.5–15.5)
WBC: 11 10*3/uL — AB (ref 4.0–10.5)

## 2018-01-01 LAB — PROTIME-INR
INR: 1.03
PROTHROMBIN TIME: 13.4 s (ref 11.4–15.2)

## 2018-01-01 LAB — PHOSPHORUS: PHOSPHORUS: 3.2 mg/dL (ref 2.5–4.6)

## 2018-01-01 LAB — MAGNESIUM: Magnesium: 1.9 mg/dL (ref 1.7–2.4)

## 2018-01-01 LAB — GLUCOSE, CAPILLARY: GLUCOSE-CAPILLARY: 100 mg/dL — AB (ref 65–99)

## 2018-01-01 LAB — APTT: APTT: 32 s (ref 24–36)

## 2018-01-01 MED ORDER — ORAL CARE MOUTH RINSE
15.0000 mL | Freq: Two times a day (BID) | OROMUCOSAL | Status: DC
  Administered 2018-01-01 – 2018-01-03 (×4): 15 mL via OROMUCOSAL

## 2018-01-01 MED ORDER — SENNOSIDES-DOCUSATE SODIUM 8.6-50 MG PO TABS
1.0000 | ORAL_TABLET | Freq: Two times a day (BID) | ORAL | Status: DC
Start: 2018-01-01 — End: 2018-01-01

## 2018-01-01 MED ORDER — LABETALOL HCL 5 MG/ML IV SOLN
20.0000 mg | Freq: Once | INTRAVENOUS | Status: AC
  Administered 2018-01-01: 20 mg via INTRAVENOUS

## 2018-01-01 MED ORDER — STROKE: EARLY STAGES OF RECOVERY BOOK
Freq: Once | Status: DC
  Filled 2018-01-01: qty 1

## 2018-01-01 MED ORDER — ACETAMINOPHEN 650 MG RE SUPP
650.0000 mg | RECTAL | Status: DC | PRN
Start: 2018-01-01 — End: 2018-01-01

## 2018-01-01 MED ORDER — ACETAMINOPHEN 160 MG/5ML PO SOLN: 650.0000 mg | ORAL | Status: DC | PRN

## 2018-01-01 MED ORDER — ACETAMINOPHEN 325 MG PO TABS: 650.0000 mg | ORAL_TABLET | ORAL | Status: DC | PRN

## 2018-01-01 MED ORDER — SENNOSIDES-DOCUSATE SODIUM 8.6-50 MG PO TABS: 1.0000 | ORAL_TABLET | Freq: Two times a day (BID) | ORAL | Status: DC

## 2018-01-01 MED ORDER — ACETAMINOPHEN 160 MG/5ML PO SOLN
650.0000 mg | ORAL | Status: DC | PRN
Start: 2018-01-01 — End: 2018-01-07
  Administered 2018-01-02 – 2018-01-07 (×10): 650 mg
  Filled 2018-01-01 (×11): qty 20.3

## 2018-01-01 MED ORDER — VALPROATE SODIUM 500 MG/5ML IV SOLN
500.0000 mg | Freq: Every day | INTRAVENOUS | Status: DC
  Administered 2018-01-01 – 2018-01-03 (×3): 500 mg via INTRAVENOUS
  Filled 2018-01-01 (×3): qty 5

## 2018-01-01 MED ORDER — SODIUM CHLORIDE 3 % IV SOLN
INTRAVENOUS | Status: DC
  Administered 2018-01-01 – 2018-01-02 (×3): 75 mL/h via INTRAVENOUS
  Administered 2018-01-02: 100 mL/h via INTRAVENOUS
  Administered 2018-01-03: 25 mL/h via INTRAVENOUS
  Filled 2018-01-01 (×20): qty 500

## 2018-01-01 MED ORDER — ACETAMINOPHEN 650 MG RE SUPP: 650.0000 mg | RECTAL | Status: DC | PRN

## 2018-01-01 MED ORDER — LABETALOL HCL 5 MG/ML IV SOLN
20.0000 mg | Freq: Once | INTRAVENOUS | Status: DC
  Filled 2018-01-01: qty 4

## 2018-01-01 MED ORDER — NICARDIPINE HCL IN NACL 20-0.86 MG/200ML-% IV SOLN
0.0000 mg/h | INTRAVENOUS | Status: DC
  Administered 2018-01-01: 5 mg/h via INTRAVENOUS
  Filled 2018-01-01: qty 200

## 2018-01-01 MED ORDER — PANTOPRAZOLE SODIUM 40 MG IV SOLR
40.0000 mg | Freq: Every day | INTRAVENOUS | Status: DC
  Administered 2018-01-01: 40 mg via INTRAVENOUS
  Filled 2018-01-01: qty 40

## 2018-01-01 MED ORDER — NICARDIPINE HCL IN NACL 20-0.86 MG/200ML-% IV SOLN
0.0000 mg/h | INTRAVENOUS | Status: DC
  Administered 2018-01-01 (×3): 5 mg/h via INTRAVENOUS
  Administered 2018-01-02: 2.5 mg/h via INTRAVENOUS
  Filled 2018-01-01 (×2): qty 200

## 2018-01-01 NOTE — Progress Notes (Signed)
Pt had f/u head CT scan at 1230.  Worsening CT scan.  Notified Dr. Pearlean BrownieSethi and he came to talk with family.  Family still wants to be aggressive at this time.  3% started and foley placed d/t retention.  CCM will still monitor pt for possible need for intubation however pt maintaining airway ok for now.  Will continue to monitor pt and will provide support to family.

## 2018-01-01 NOTE — H&P (Signed)
Stroke H&P  Reason for Consult: Acute worsening of left-sided weakness Referring Physician: PMR- Dr. Caren HazySwarts  CC: Acute worsening of left-sided weakness  History is obtained from: Chart, patient's wife at bedside  HPI: Delora FuelRichard A Macnaughton is a 72 y.o. 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 There is no clear last known normal but most likely he was at his new baseline normal up until 6 or 7 PM yesterday. His wife was in the room with him and slept in that room.  She woke up in the night, and in an attempt to speak with her could not really make sense of his speech as it was very slurred.  She said he was able to stand with great difficulty to use a urinal, which was different than what he had been doing in the prior few days.  He also attempted to drink some water and seemed like he choked on it. He has residual right hemiparesis from the left-sided ICH in October with increased tone in his right arm and right leg for which Botox treatment was being considered. He was recovering from this recent right parietal ICH that happened in middle of January 2019 and regaining some strength on his left side with physical and occupational therapy in the rehabilitation unit. I was called by the rapid response team who were called because of this acute neuro change. I requested that the patient get a stat noncontrast CT of the head as I made my way to evaluate him. The noncontrast CT of the head was performed and shows a new right frontal bleed about 55 cc with some subarachnoid blood and 5 mm midline shift. I have made a decision to emergently move him to the  neurological ICU under the stroke service.  Of note, patient is not on aspirin or heparin. Labs-recent labs are not available to review as the most recent labs were done about a few days ago. He is not on any antihypertensive medication and his blood pressures have been fluctuating from systolics in the 80s and 90s to a few readings of systolics in the 140s and 150s.   LKW: 6 PM on 12/31/2017 tpa given?: no, ICH Premorbid modified Rankin scale (mRS): 4   ROS:  Unable to obtain due to altered mental status.   Past Medical History:  Diagnosis Date  . Coronary artery disease   . Dyspnea on exertion   . Hemorrhagic stroke (HCC)   . Hyperlipidemia   . Hypertension   . Seizures (HCC)   . Stroke (HCC)   . Tobacco abuse    Family History  Problem Relation Age of Onset  . Heart disease Father   . CVA Mother   . Heart disease Maternal Grandfather    Social History:   reports that he quit smoking about 13 months ago. His smoking use included cigarettes. he has never used smokeless tobacco. He reports that he drinks alcohol. He reports that he does not use drugs.  Medications  Current Facility-Administered Medications:  .   stroke: mapping our early stages of recovery book, , Does not apply, Once, Milon DikesArora, Michaelina Blandino, MD .  acetaminophen (TYLENOL) tablet 650 mg, 650 mg, Oral,  Q4H PRN **OR** acetaminophen (TYLENOL) solution 650 mg, 650 mg, Per Tube, Q4H PRN **OR** acetaminophen (TYLENOL) suppository 650 mg, 650 mg, Rectal, Q4H PRN, Milon Dikes, MD .  labetalol (NORMODYNE,TRANDATE) injection 20 mg, 20 mg, Intravenous, Once **AND** nicardipine (CARDENE) 20mg  in 0.86% saline IV infusion (0.1 mg/ml), 0-15 mg/hr, Intravenous, Continuous, Milon Dikes, MD .  pantoprazole (PROTONIX) injection 40 mg, 40 mg, Intravenous, QHS, Milon Dikes, MD .  senna-docusate (Senokot-S) tablet 1 tablet, 1 tablet, Oral, BID, Milon Dikes, MD .  valproate (DEPACON) 500 mg in dextrose 5 % 50 mL IVPB, 500 mg,  Intravenous, Q1400, Milon Dikes, MD  Exam: Current vital signs: There were no vitals taken for this visit. Vital signs in last 24 hours: Temp:  [97.8 F (36.6 C)-98.2 F (36.8 C)] 97.8 F (36.6 C) (02/09 0454) Pulse Rate:  [70-87] 70 (02/09 0454) Resp:  [18-22] 22 (02/09 0454) BP: (99-156)/(70-82) 156/82 (02/09 0454) SpO2:  [93 %] 93 % (02/08 2009) General: Lethargic appearing, eyes open, follows some simple commands, no acute distress. HEENT: Normocephalic, atraumatic, moist oral mucous membranes, clear nares, clear throat Lungs: Scattered rhonchi CVS: S1-S2 heard, regular rate rhythm Abdomen: Soft nondistended nontender Extremities: Warm well perfused with no edema Neurological exam Lethargic, eyes open, follows some simple commands on the right. Speech: Nonverbal. Cranial nerves: Pupils equal round reactive to light, rightward gaze preference with ability to bring the eyes to midline but not look to the left, left lower facial droop.  Left homonymous hemianopsia Motor exam: 2/5 right upper extremity, 2/5 right lower extremities, flaccid 0/5 left upper extremity, 2/5 left lower extremity.  Increased tone in right upper and lower extremity and decreased tone in the left upper and lower extremity. Sensory: Briskly withdraws to noxious stimulus on the right.  Upon giving noxious stimulation to the left arm he tries to localize from the right.  Weak withdrawal to noxious stimulus on the left lower extremity. Gait and coordination were deferred due to his mental status. NIHSS 1a Level of Conscious.: 1 1b LOC Questions: 2 1c LOC Commands: 1 2 Best Gaze: 1 3 Visual: 2 4 Facial Palsy: 2 5a Motor Arm - left: 4 5b Motor Arm - Right: 2 6a Motor Leg - Left: 2 6b Motor Leg - Right: 2 7 Limb Ataxia: 0 8 Sensory: 0 9 Best Language: 2 10 Dysarthria: 2 11 Extinct. and Inatten.: 2 TOTAL: 25  Labs I have reviewed labs in epic and the results pertinent to this consultation are:  CBC     Component Value Date/Time   WBC 9.5 12/23/2017 0814   RBC 4.24 12/23/2017 0814   HGB 12.9 (L) 12/23/2017 0814   HCT 38.9 (L) 12/23/2017 0814   PLT 363 12/23/2017 0814   MCV 91.7 12/23/2017 0814   MCH 30.4 12/23/2017 0814   MCHC 33.2 12/23/2017 0814   RDW 14.6 12/23/2017 0814   LYMPHSABS 1.1 12/13/2017 0729   MONOABS 1.7 (H) 12/13/2017 0729   EOSABS 0.9 (H) 12/13/2017 0729   BASOSABS 0.1 12/13/2017 0729    CMP     Component Value Date/Time   NA 134 (L) 12/23/2017 0814   K 4.0 12/23/2017 0814   CL 99 (L) 12/23/2017 0814   CO2 24 12/23/2017 0814   GLUCOSE 137 (H) 12/23/2017 0814   GLUCOSE 113 (H) 09/21/2006 0832   BUN 16 12/23/2017 0814   CREATININE 0.77 12/23/2017 0814   CALCIUM 9.0 12/23/2017 0814   PROT 6.3 (L) 12/13/2017 0729   ALBUMIN 2.8 (L) 12/13/2017  0729   AST 22 12/13/2017 0729   ALT 24 12/13/2017 0729   ALKPHOS 47 12/13/2017 0729   BILITOT 1.0 12/13/2017 0729   GFRNONAA >60 12/23/2017 0814   GFRAA >60 12/23/2017 0814    Imaging I have reviewed the images obtained  CT-scan of the brain done today when compared to the one from middle of January shows a new right frontal ICH with some subarachnoid component as well.  Midline shift 4 mm. Also demonstrated is residual right temporal bleed.  Assessment:  72 year old man past history of recent right parietal ICH, prior left-sided ICH in October 2018, seizure, hypertension hyperlipidemia who was in rehabilitation recovering from the right parietal ICH of January 2019, noted to have acute worsening of left-sided weakness, left facial droop and drowsiness. Repeat imaging shows a new right frontal ICH with a subarachnoid component as well as slight midline shift without any signs of herniation. New right cerebral ICH likely secondary to the cerebral amyloid angiopathy.  Plan: Nontraumatic lobar ICH  Acuity: Acute Laterality: right Current suspected etiology: Cerebral amyloid angiopathy Treatment: -Admit to NICU  under stroke attending -ICH Score:1 -ICH Volume:55 -BP control goal SYS<140 -PT/OT/ST  -neuromonitoring  CNS Cerebral edema -Close neuro monitoring -consider HTS  Dysarthria Dysphagia following ICH  -NPO until cleared by speech -ST -Advance diet as tolerated -May need PEG  Toxic encephalopathy -Correct metabolic causes -Monitor  Hemiplegia and hemiparesis following nontraumatic intracerebral hemorrhage affecting right dominant side  Hemiplegia and hemiparesis following nontraumatic intracerebral hemorrhage affecting left non-dominant side  -Continue PT/OT/ST  RESP Possible aspiration pneumonia -Portable chest x-ray -Decision on antibiotics after imaging labs -PCCM consult as per report after the encounter his breathing was labored. Still pending CXR  CV Essential (primary) hypertension -Aggressive BP control, goal SBP < 140 -Labetalol + Cardene gtt  GI/GU -Gentle hydration  HEME Iron Deficiency Anemia -Monitor -transfuse for hgb < 7  Check PT/INR  ENDO -goal HgbA1c < 7  Fluid/Electrolyte Disorders Hyponatremia-mild.  Monitor. -Replete -Repeat labs  ID Possible Aspiration PNA -CXR -NPO -Monitor  Prophylaxis DVT:scd  GI: na Bowel: doc/senna   Dispo: IP Rehab based on clinical course  Diet: NPO until cleared by speech  Code Status: Full Code    Spoke with daughter and wife at bedside. Showed CT images. Explained the current condition and rationale for NICU transfer and answered all questions to the best of my ability. They will appreciate updates in the day from stroke team after rounds.  Present on arrival: Hemiplegia, hemiparesis, cerebral edema, possible aspiration pneumonia, hyponatremia  -- Milon Dikes, MD [facility] Pager: [phone] If 7pm to 7am, please call on call as listed on AMION.  Patient will be followed by the stroke team.  Please page stroke team MD/NP/PA on [email or web address]  CRITICAL CARE ATTESTATION This  patient is critically ill and at significant risk of neurological worsening, death and care requires constant monitoring of vital signs, hemodynamics,respiratory and cardiac monitoring. I spent 55  minutes of neurocritical care time performing neurological assessment, discussion with family, other specialists and medical decision making of high complexityin the care of  this patient.

## 2018-01-01 NOTE — Progress Notes (Signed)
Pt transferred to neuro ICU (4N) s/p CT head results and report given to receiving RN Marya LandryKeyanna

## 2018-01-01 NOTE — Progress Notes (Signed)
SLP Cancellation Note  Patient Details Name: Jaime Pierce MRN: [id number] DOB: 07/31/1946   Cancelled treatment:       Reason Eval/Treat Not Completed: Patient's level of consciousness. Pt not alert or following commands. Spoke with pt's wife; will defer swallow evaluation. RN to page if appropriate.  Rondel BatonMary Beth Analeigh Pierce, [place]MS, CCC-SLP Speech-Language Pathologist [phone]   [name] 12/25/2017, 11:34 AM

## 2018-01-01 NOTE — Progress Notes (Signed)
PT Cancellation Note  Patient Details Name: Jaime Pierce MRN: [id number] DOB: 1946/08/25   Cancelled Treatment:    Reason Eval/Treat Not Completed: Patient not medically ready(active bedrest at this time)   [name] 01/03/2018, 8:20 AM [name], PT DPT  Board Certified Neurologic Specialist [phone]

## 2018-01-01 NOTE — Progress Notes (Signed)
STROKE TEAM PROGRESS NOTE   HISTORY OF PRESENT ILLNESS (per record) Jaime Pierce is a 72 y.o. 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 There is no clear last known normal but most likely he was at his new baseline normal up until 6 or 7 PM yesterday. His wife was in the room with him and slept in that room.  She woke up in the night, and in an attempt to speak with her could not really make sense of his speech as it was very slurred.  She said he was able to stand with great difficulty to use a urinal, which was different than what he had been doing in the prior few days.  He also attempted to drink some water and seemed like he choked on it. He has residual right hemiparesis from the left-sided ICH in October with increased tone in his right arm and right leg for which Botox treatment was being considered. He was recovering from this recent right parietal ICH that happened in middle of January 2019 and regaining some strength on his left side with physical and occupational therapy in the rehabilitation unit. I was called by the rapid response team who were called because of this acute neuro change. I requested that the patient get a stat noncontrast CT of the head as I made my way to evaluate him. The noncontrast CT of the head was performed and shows a new right frontal bleed about 55 cc with some subarachnoid blood and 5 mm midline shift. I have made a decision to emergently move him to the neurological ICU under the stroke service.  Of note, patient is not on aspirin or heparin. Labs-recent labs are not available to review as the most  recent labs were done about a few days ago. He is not on any antihypertensive medication and his blood pressures have been fluctuating from systolics in the 80s and 90s to a few readings of systolics in the 140s and 150s.   LKW: 6 PM on 12/31/2017 tpa given?: no, ICH Premorbid modified Rankin scale (mRS): 4     SUBJECTIVE (INTERVAL HISTORY) Multiple family members at the bedside. The patient has a right gaze preference, he is unable to communicate, and follows minimal commands. Dr. Pearlean Brownie had a long talk with the family regarding the gravity of the situation. The chance for any meaningful recovery or quality of life at this point is severely limited. He encouraged the family to consider comfort care only. A follow-up CT scan was pending.    OBJECTIVE Temp:  [97.8 F (36.6 C)-98.2 F (36.8 C)] 97.8 F (36.6 C) (02/09 0454) Pulse Rate:  [70-87] 81 (02/09 0720) Resp:  [11-22] 18 (02/09 0720) BP: (99-168)/(70-98) 131/71 (02/09 0720) SpO2:  [91 %-98 %] 97 % (02/09 0720)  CBC:  Recent Labs  Lab 01/13/2018 0720  WBC 11.0*  HGB 13.5  HCT 39.9  MCV 91.3  PLT 327    Basic Metabolic Panel:  Recent Labs  Lab 12/24/2017 0720  NA 138  K 3.7  CL 103  CO2 23  GLUCOSE 131*  BUN 15  CREATININE 0.70  CALCIUM 9.0  MG 1.9  PHOS  3.2    Lipid Panel:     Component Value Date/Time   CHOL 131 09/02/2017 0314   TRIG 73 09/02/2017 0314   TRIG 65 09/21/2006 0832   HDL 35 (L) 09/02/2017 0314   CHOLHDL 3.7 09/02/2017 0314   VLDL 15 09/02/2017 0314   LDLCALC 81 09/02/2017 0314   HgbA1c:  Lab Results  Component Value Date   HGBA1C 5.9 (H) 09/02/2017   Urine Drug Screen:     Component Value Date/Time   LABOPIA NONE DETECTED 12/06/2017 1506   COCAINSCRNUR NONE DETECTED 12/06/2017 1506   LABBENZ NONE DETECTED 12/06/2017 1506   AMPHETMU NONE DETECTED 12/06/2017 1506   THCU NONE DETECTED 12/06/2017 1506   LABBARB NONE DETECTED 12/06/2017 1506    Alcohol Level     Component Value  Date/Time   ETH <10 12/06/2017 0950    IMAGING   Ct Head Wo Contrast 01/11/2018 IMPRESSION: 1. Progressive large right frontal hematoma now estimated at 80 cc volume. There is new right lateral ventricular extension and increased local subarachnoid blood. Midline shift measures 6 mm. No hydrocephalus. 2. Sequela of prior lobar hemorrhages in the bilateral parietal lobe. 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 Suspect amyloid angiopathy.   Ct Head Wo Contrast 01/10/2018 IMPRESSION:  1. Right lateral frontal lobe acute hemorrhage measuring up to 5.2 cm, 54 cc. Associated edema and mass effect with partial effacement of right lateral ventricle and 5 mm right-to-left midline shift. No herniation.  2. Small volume subarachnoid hemorrhage over right cerebral convexity.  3. Interval dispersion of right parietal and right temporal hematoma with residual encephalomalacia.  4. Stable chronic left parietal infarction. Stable chronic microvascular ischemic changes and parenchymal volume loss of the brain.    Chest Port 1 View 12/26/2017 IMPRESSION:  1. Mild atelectasis at the bases.  2. COPD.     PHYSICAL EXAM Vitals:   01/08/2018 0705 12/26/2017 0710 12/30/2017 0715 12/25/2017 0720  BP: 138/73 135/74 128/78 131/71  Pulse: 79 80 80 81  Resp: 18 17 11 18   SpO2: 95% 95% 98% 97%   Frail elderly caucasian male not in distress. . Afebrile. Head is nontraumatic. Neck is supple without bruit.    Cardiac exam no murmur or gallop. Lungs are clear to auscultation. Distal pulses are well felt. Neurological Exam :  Stuporous but can be aroused and partially opens eyes and follows simple midline commands. Right gaze preference. Pupils irregular but reactive. Fundi not visualized. Unable to look to the left past midline. Blinks to threat on the right but not on the left. Left lower facial weakness. Tongue midline. Dense left hemiplegia with minimum withdrawal to painful stimuli. Able to move right upper  and lower extremity partially against gravity but increased tone in spasticity with some tremors and illsustained clonus. Area deep tendon reflexes are brisker on the right than the left. Right plantar equivocal left upgoing. ASSESSMENT/PLAN Jaime Pierce is a [age] y.o. male with history of previous ICH felt secondary to cerebral amyloid angiopathy, hyperlipidemia, hypertension, seizures, previous tobacco use, and coronary artery disease presenting with slurred speech. He did not receive IV t-PA due to ICH.  Right lateral frontal lobe acute hemorrhage - felt secondary to cerebral amyloid angiopathy.  Resultant  right gaze deviation and left hemiplegia   F/U CT head - Progressive large right frontal hematoma now estimated at 80 cc volume  MRI head - not performed  MRA head - not performed  Carotid Doppler - not performed  2D Echo -  not performed  LDL - not performed  HgbA1c - not performed  VTE prophylaxis - SCDs Diet NPO time specified Fall precautions  No antithrombotic prior to admission, now on No antithrombotic  Ongoing aggressive stroke risk factor management  Therapy recommendations:  pending  Disposition:  Pending  Hypertension  Stable  Permissive hypertension (OK if < 220/120) but gradually normalize in 5-7 days  Long-term BP goal normotensive   Other Stroke Risk Factors  Advanced age  Former cigarette smoker - quit  ETOH use, advised to drink no more than 1 drink per day.  Hx of ICH 2/2 cerebral amyloid angiopathy  Family hx stroke (mother)  Coronary artery disease   Other Active Problems  None   Plan / Recommendations   Family to discuss palliative care   Hospital day # 0  Delton See PA-C [facility] Pager [phone] 12/31/2017, 1:30 PM I have personally examined this patient, reviewed notes, independently viewed imaging studies, participated in medical decision making and plan of care.ROS completed by me  personally and pertinent positives fully documented  I have made any additions or clarifications directly to the above note. Agree with note above. This unfortunate gentleman has now his third episode of intracerebral hemorrhage in the last 4 months likely from cerebral amyloid angiopathy. Repeat CT scan this afternoon shows increase size of hematoma, cytotoxic edema and midline shift. His prognosis is quite poor. I had a long discussion with the patient's wife, daughter and Dr. Hyacinth Meeker. Family is struggling to make a decision about withdrawal of care or not. I recommend starting hypertonic saline at 75 mL an hour through peripheral IV but if patient's respiratory status declined and he may need emergent intubation. He is unlikely to survive without prolonged aggressive care which include includes life support feeding tube PEG tube and possibly tracheostomy and nursing home.-Acknowledged that he may not want this. She needs time to make decision about goals of care. They will consult palliative care team. This patient is critically ill and at significant risk of neurological worsening, death and care requires constant monitoring of vital signs, hemodynamics,respiratory and cardiac monitoring, extensive review of multiple databases, frequent neurological assessment, discussion with family, other specialists and medical decision making of high complexity.I have made any additions or clarifications directly to the above note.This critical care time does not reflect procedure time, or teaching time or supervisory time of PA/NP/Med Resident etc but could involve care discussion time.  I spent 80 minutes of neurocritical care time  in the care of  this patient.      Delia Heady, MD Medical Director [facility] Pager: [phone] 01/09/2018 2:25 PM  To contact Stroke Continuity provider, please refer to [email or web address]. After hours, contact General Neurology

## 2018-01-01 NOTE — Progress Notes (Signed)
[facility] PHYSICAL MEDICINE & REHABILITATION     PROGRESS NOTE    Subjective/Complaints: Patient seen lying in bed this morning. Mild overnight by nursing regarding increase in lethargy, garbled speech, and relative unresponsiveness. Head CT and stat labs ordered.  ROS: Unable to assess due to lethargy and cognition  Objective: Vital Signs: Blood pressure (!) 156/82, pulse 70, temperature 97.8 F (36.6 C), temperature source Oral, resp. rate (!) 22, height 6' (1.829 m), weight 74.7 kg (164 lb 10.9 oz), SpO2 93 %. Ct Head Wo Contrast  Result Date: 01/17/2018 CLINICAL DATA:  72 y/o  M; altered level of consciousness. EXAM: CT HEAD WITHOUT CONTRAST TECHNIQUE: Contiguous axial images were obtained from the base of the skull through the vertex without intravenous contrast. COMPARISON:  12/09/2017 CT head. FINDINGS: Brain: Right lateral frontal lobe acute hemorrhage measuring 4.6 x 4.3 x 5.2 cm (volume = 54 cm^3). Surrounding edema and local mass effect effaces sulci and results in 5 mm right-to-left midline shift and partial effacement of the right lateral ventricle. There is a small volume of subarachnoid hemorrhage over the right cerebral convexity. Small chronic infarction in the left parietal lobe. Resolved hemorrhage with small focus of encephalomalacia in the right parietal lobe. Resolved hemorrhage in encephalomalacia in the right anterior temporal lobe. Stable background of microvascular ischemic changes and parenchymal volume loss. Vascular: Calcific atherosclerosis of carotid siphons. No hyperdense vessel. Skull: Normal. Negative for fracture or focal lesion. Sinuses/Orbits: Mucous retention cysts in the maxillary sinus. Normal aeration of mastoid air cells. Orbits are unremarkable. Other: None. IMPRESSION: 1. Right lateral frontal lobe acute hemorrhage measuring up to 5.2 cm, 54 cc. Associated edema and mass effect with partial effacement of right lateral ventricle and 5 mm right-to-left  midline shift. No herniation. 2. Small volume subarachnoid hemorrhage over right cerebral convexity. 3. Interval dispersion of right parietal and right temporal hematoma with residual encephalomalacia. 4. Stable chronic left parietal infarction. Stable chronic microvascular ischemic changes and parenchymal volume loss of the brain. Critical Value/emergent results were called by telephone at the time of interpretation on 12/26/2017 at 6:01 am to Dr. Milon Dikes , who verbally acknowledged these results. Electronically Signed   By: Mitzi Hansen M.D.   On: 01/06/2018 05:59   Chest Port 1 View  Result Date: 01/14/2018 CLINICAL DATA:  Followup intracranial hemorrhage EXAM: PORTABLE CHEST 1 VIEW COMPARISON:  10/05/2017 FINDINGS: Streaky densities at the bases best attributed atelectasis. No edema, effusion, or air bronchogram. Hyperinflation. Emphysema on 2018 neck CTA. Prior CABG. Normal heart size. IMPRESSION: 1. Mild atelectasis at the bases. 2. COPD. Electronically Signed   By: Marnee Spring M.D.   On: 12/31/2017 07:37   Recent Labs    01/20/2018 0720  WBC 11.0*  HGB 13.5  HCT 39.9  PLT 327   No results for input(s): NA, K, CL, GLUCOSE, BUN, CREATININE, CALCIUM in the last 72 hours.  Invalid input(s): CO CBG (last 3)  Recent Labs    01/16/2018 0506  GLUCAP 100*    Wt Readings from Last 3 Encounters:  12/22/17 74.7 kg (164 lb 10.9 oz)  12/07/17 81.4 kg (179 lb 7.3 oz)  12/03/17 79.8 kg (176 lb)    Physical Exam:  Constitutional: Lethargic. Unintelligible sounds  HENT: Normocephalic. Atraumatic  Eyes:Eyes closed  Cards:RRR. No JVD                Respiratory: normal effort. Clear    GI: Bowel sounds are normal. He exhibitsno distension Skin.  Rash resolved,  skin intact Musc: No edema, unable to assess for tenderness. Neurological: He is lethargic Motor exam limited due to lack of participation Increased tone noted  Psych: Unable to assess due to mentation    Assessment/Plan: 1.  Functional deficits secondary to intracranial hemorrhage which require 3+ hours per day of interdisciplinary therapy in a comprehensive inpatient rehab setting. Physiatrist is providing close team supervision and 24 hour management of active medical problems listed below. Physiatrist and rehab team continue to assess barriers to discharge/monitor patient progress toward functional and medical goals.  Function:  Bathing Bathing position Bathing activity did not occur: N/A Position: Shower  Bathing parts Body parts bathed by patient: Right arm, Chest, Abdomen, Front perineal area, Right upper leg, Left upper leg Body parts bathed by helper: Left arm, Buttocks, Right lower leg, Left lower leg  Bathing assist Assist Level: 2 helpers(+2 A to wash buttocks while OT rolls pt )      Upper Body Dressing/Undressing Upper body dressing Upper body dressing/undressing activity did not occur: N/A What is the patient wearing?: Pull over shirt/dress     Pull over shirt/dress - Perfomed by patient: Thread/unthread left sleeve, Pull shirt over trunk Pull over shirt/dress - Perfomed by helper: Thread/unthread right sleeve, Put head through opening        Upper body assist Assist Level: Touching or steadying assistance(Pt > 75%)      Lower Body Dressing/Undressing Lower body dressing Lower body dressing/undressing activity did not occur: N/A What is the patient wearing?: Underwear, Pants, [employer], Shoes Underwear - Performed by patient: Thread/unthread right underwear leg, Thread/unthread left underwear leg Underwear - Performed by helper: Pull underwear up/down Pants- Performed by patient: Thread/unthread right pants leg, Thread/unthread left pants leg Pants- Performed by helper: Pull pants up/down   Non-skid slipper socks- Performed by helper: Don/doff right sock, Don/doff left sock   Socks - Performed by helper: Don/doff right sock, Don/doff left sock   Shoes -  Performed by helper: Don/doff right shoe, Don/doff left shoe, Fasten left, Fasten right       TED Hose - Performed by helper: Don/doff right TED hose, Don/doff left TED hose  Lower body assist Assist for lower body dressing: 2 Helpers      Toileting Toileting Toileting activity did not occur: No continent bowel/bladder event   Toileting steps completed by helper: Adjust clothing prior to toileting, Performs perineal hygiene, Adjust clothing after toileting Toileting Assistive Devices: Other (comment)(stedy)  Toileting assist Assist level: Touching or steadying assistance (Pt.75%)   Transfers Chair/bed transfer Chair/bed transfer activity did not occur: Safety/medical concerns Chair/bed transfer method: Squat pivot Chair/bed transfer assist level: Moderate assist (Pt 50 - 74%/lift or lower) Chair/bed transfer assistive device: Armrests Mechanical lift: Stedy   Locomotion Ambulation Ambulation activity did not occur: Safety/medical concerns   Max distance: 60ft Assist level: Moderate assist (Pt 50 - 74%)   Wheelchair Wheelchair activity did not occur: Safety/medical concerns Type: Manual Max wheelchair distance: 10 ft Assist Level: Touching or steadying assistance (Pt > 75%)(B LEs)  Cognition Comprehension Comprehension assist level: Understands basic 90% of the time/cues < 10% of the time  Expression Expression assist level: Expresses basic 90% of the time/requires cueing < 10% of the time.  Social Interaction Social Interaction assist level: Interacts appropriately 90% of the time - Needs monitoring or encouragement for participation or interaction.  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 Discussed with neurology, plan to transfer patient to ICU.  2. DVT Prophylaxis/Anticoagulation: SCDs. Monitor for any signs of DVT 3. Pain Management:    -mild left RTC syndrome, AC jt arthritis   -He has chronic right knee pain due to osteoarthritis   -Continue Voltaren gel to both areas. Ice to right knee    -Injected right knee 1/22 with improvement.  Do not believe that pain in the right knee is as much of a problem as his tone is in the hamstrings          --gabapentin QHS for dysesthesias   -hasn't tolerated higher doses of baclofen and gabapentin 4. Mood:Provide emotional support 5. Neuropsych: This patientisnot capable of making decisions on hisown behalf. 6. Skin/Wound Care:    -prn hydrocortisone cream Benadryl and hypoallergenic sheets   Resolved 7. Fluids/Electrolytes/Nutrition:   BMP within acceptable range on 2/9 8.History of partial seizure versus transient focal neurological episode. EEG 11/10/2017 negative. Initially on Keppra changed to Depakote 500 mg twice a day 01/15/2019as well as Vimpat 100 mg twice a day   -No recurrent seizures on rehab    Given his tolerance of medications I would not increase his Depakote at this point.     9.Hypertension. No current antihypertensive medication. Monitor with increased mobility Vitals:   12/31/17 2009 12/28/2017 0454  BP:  (!) 156/82  Pulse:  70  Resp:  (!) 22  Temp:  97.8 F (36.6 C)  SpO2: 93%     Blood pressure elevated this morning  10.COPD with history of tobacco abuse in the past. Continue Dulera, no breathing difficulties 11. Spasticity: Continue baclofen 20 mg 3 times daily.      -Aggressive range of motion and  orthotics. 12. Leukocytosis    WBCs 11.0 on 2/9   Cont to monitor  LOS (Days) 22 A FACE TO FACE EVALUATION WAS PERFORMED  Ankit Karis JubaAnil Patel, MD 01/03/2018 8:37 AM

## 2018-01-01 NOTE — Consult Note (Signed)
PULMONARY / CRITICAL CARE MEDICINE   Name: Jaime Pierce MRN: [id number] DOB: 1946/11/11    ADMISSION DATE:  01/28/18 CONSULTATION DATE:  01/28/2018  REFERRING MD:  Neurology  CHIEF COMPLAINT:  Altered mental status.  HISTORY OF PRESENT ILLNESS:   Jaime Pierce is a 72 y.o. 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 There is no clear last known normal but most likely he was at his new baseline normal up until 6 or 7 PM yesterday. His wife was in the room with him and slept in that room.  She woke up in the night, and in an attempt to speak with her could not really make sense of his speech as it was very slurred.  She said he was able to stand with great difficulty to use a urinal, which was different than what he had been doing in the prior few days.  He also attempted to drink some water and seemed like he choked on it. He has residual right hemiparesis from the left-sided ICH in October with increased tone in his right arm and right leg for which Botox treatment was being considered. He was recovering from this recent right parietal ICH that happened in middle of January 2019 and regaining some strength on his left side with physical and occupational therapy in the rehabilitation unit. I was called by the rapid response team who were called because of this acute neuro change. I requested that the patient get a stat noncontrast CT of the head as I made my way to evaluate him. The noncontrast CT of the head was performed and shows a new right frontal bleed about 55 cc with some subarachnoid blood and 5 mm midline shift. I have made a decision  to emergently move him to the neurological ICU under the stroke service.  Of note, patient is not on aspirin or heparin. Labs-recent labs are not available to review as the most recent labs were done about a few days ago. He is not on any antihypertensive medication and his blood pressures have been fluctuating from systolics in the 80s and 90s to a few readings of systolics in the 140s and 150s.  Wife at the bedside and confirms the above history.  PAST MEDICAL HISTORY :  He  has a past medical history of Coronary artery disease, Dyspnea on exertion, Hemorrhagic stroke (HCC), Hyperlipidemia, Hypertension, Seizures (HCC), Stroke (HCC), and Tobacco abuse.  PAST SURGICAL HISTORY: He  has a past surgical history that includes Coronary artery bypass graft.  Allergies  Allergen Reactions  . Sulfonamide Derivatives     No current facility-administered medications on file prior to encounter.    Current Outpatient Medications on File Prior to Encounter  Medication Sig  . acetaminophen (TYLENOL) 325 MG tablet Take by mouth every 4 (four) hours as needed.  [name] Kitchen albuterol (PROAIR HFA) 108 (90 Base) MCG/ACT inhaler 2 puffs up to every 4 hours if can't catch your breath (Patient taking differently: Inhale 1-2 puffs into the lungs every 4 (four) hours as needed for wheezing or shortness of breath. )  . b complex vitamins tablet Take 1 tablet by mouth daily.  . baclofen (LIORESAL) 20  MG tablet Take 1 tablet (20 mg total) by mouth 3 (three) times daily.  . budesonide-formoterol (SYMBICORT) 160-4.5 MCG/ACT inhaler Take 2 puffs first thing in am and then another 2 puffs about 12 hours later. (Patient taking differently: Inhale 2 puffs into the lungs 2 (two) times daily. )  . Cholecalciferol (VITAMIN D3) 2000 units capsule Take 1 capsule (2,000 Units total) by mouth daily.  . diclofenac sodium (VOLTAREN) 1 % GEL Apply 2 g topically 4 (four) times daily as needed (Pain).  [name] Kitchen divalproex (DEPAKOTE) 500 MG DR  tablet Take 1 tablet (500 mg total) by mouth daily.  . irbesartan (AVAPRO) 75 MG tablet Take 1 tablet (75 mg total) by mouth daily.  . metoprolol tartrate (LOPRESSOR) 25 MG tablet Take 0.5 tablets (12.5 mg total) by mouth 2 (two) times daily.  . nitroGLYCERIN (NITROSTAT) 0.4 MG SL tablet Place 1 tablet (0.4 mg total) under the tongue every 5 (five) minutes as needed for chest pain.  [name] Kitchen oseltamivir (TAMIFLU) 75 MG capsule Take 1 capsule (75 mg total) by mouth 2 (two) times daily.    FAMILY HISTORY:  His indicated that his mother is deceased. He indicated that his father is deceased. He indicated that the status of his maternal grandfather is unknown.   SOCIAL HISTORY: He  reports that he quit smoking about 13 months ago. His smoking use included cigarettes. he has never used smokeless tobacco. He reports that he drinks alcohol. He reports that he does not use drugs.  REVIEW OF SYSTEMS:   Unobtainable.  SUBJECTIVE:  Unobtainable.  VITAL SIGNS: BP 131/71   Pulse 81   Resp 18   SpO2 97%   HEMODYNAMICS:    VENTILATOR SETTINGS:    INTAKE / OUTPUT: No intake/output data recorded.  PHYSICAL EXAMINATION: General:  Unresponsive. CSR noted. Neuro:  PERL. +corneal reflex. Right sided weakness noted on tactile stimulation. Withdraws left side to tactile stimulation.  HEENT:  No jvd. No stridor. No retraction of the accessory respiratory muscles. Cardiovascular:  S1s2, regular rhythm. No murmur, gallop or rub. Lungs:  Bilateral breath sounds. Clear to auscultation. +snoring. Abdomen:  Soft, no elicited tenderness. No paradoxical motion. Musculoskeletal:  Warm extremities. +2 pulses.  Skin:  No cyanosis or mottling.  LABS:  BMET No results for input(s): NA, K, CL, CO2, BUN, CREATININE, GLUCOSE in the last 168 hours.  Electrolytes No results for input(s): CALCIUM, MG, PHOS in the last 168 hours.  CBC Recent Labs  Lab 12/27/2017 0720  WBC 11.0*  HGB 13.5  HCT 39.9  PLT 327     Coag's Recent Labs  Lab 01/03/2018 0720  APTT 32  INR 1.03    Sepsis Markers No results for input(s): LATICACIDVEN, PROCALCITON, O2SATVEN in the last 168 hours.  ABG No results for input(s): PHART, PCO2ART, PO2ART in the last 168 hours.  Liver Enzymes No results for input(s): AST, ALT, ALKPHOS, BILITOT, ALBUMIN in the last 168 hours.  Cardiac Enzymes No results for input(s): TROPONINI, PROBNP in the last 168 hours.  Glucose Recent Labs  Lab 12/31/2017 0506  GLUCAP 100*    Imaging Ct Head Wo Contrast  Result Date: 01/13/2018 CLINICAL DATA:  72 y/o  M; altered level of consciousness. EXAM: CT HEAD WITHOUT CONTRAST TECHNIQUE: Contiguous axial images were obtained from the base of the skull through the vertex without intravenous contrast. COMPARISON:  12/09/2017 CT head. FINDINGS: Brain: Right lateral frontal lobe acute hemorrhage measuring 4.6 x 4.3 x 5.2 cm (volume = 54 cm^3). Surrounding edema  and local mass effect effaces sulci and results in 5 mm right-to-left midline shift and partial effacement of the right lateral ventricle. There is a small volume of subarachnoid hemorrhage over the right cerebral convexity. Small chronic infarction in the left parietal lobe. Resolved hemorrhage with small focus of encephalomalacia in the right parietal lobe. Resolved hemorrhage in encephalomalacia in the right anterior temporal lobe. Stable background of microvascular ischemic changes and parenchymal volume loss. Vascular: Calcific atherosclerosis of carotid siphons. No hyperdense vessel. Skull: Normal. Negative for fracture or focal lesion. Sinuses/Orbits: Mucous retention cysts in the maxillary sinus. Normal aeration of mastoid air cells. Orbits are unremarkable. Other: None. IMPRESSION: 1. Right lateral frontal lobe acute hemorrhage measuring up to 5.2 cm, 54 cc. Associated edema and mass effect with partial effacement of right lateral ventricle and 5 mm right-to-left midline shift. No  herniation. 2. Small volume subarachnoid hemorrhage over right cerebral convexity. 3. Interval dispersion of right parietal and right temporal hematoma with residual encephalomalacia. 4. Stable chronic left parietal infarction. Stable chronic microvascular ischemic changes and parenchymal volume loss of the brain. Critical Value/emergent results were called by telephone at the time of interpretation on 01/05/2018 at 6:01 am to Dr. Milon DikesASHISH ARORA , who verbally acknowledged these results. Electronically Signed   By: Mitzi HansenLance  Furusawa-Stratton M.D.   On: 06/26/2018 05:59   Chest Port 1 View  Result Date: 12/26/2017 CLINICAL DATA:  Followup intracranial hemorrhage EXAM: PORTABLE CHEST 1 VIEW COMPARISON:  10/05/2017 FINDINGS: Streaky densities at the bases best attributed atelectasis. No edema, effusion, or air bronchogram. Hyperinflation. Emphysema on 2018 neck CTA. Prior CABG. Normal heart size. IMPRESSION: 1. Mild atelectasis at the bases. 2. COPD. Electronically Signed   By: Marnee SpringJonathon  Watts M.D.   On: 06/26/2018 07:37     STUDIES:  CXR reviewed 2/9: basilar atelectasis. Prior CABG. No air space opacities. No pulmonary edema.  CULTURES:   ANTIBIOTICS:   SIGNIFICANT EVENTS: Rapid response 2/9  LINES/TUBES: Peripheral lines.  DISCUSSION: 72 y.o male with decline in mental status due to Right ICH.  ASSESSMENT / PLAN:  PULMONARY A: 1. CSR P:   1. No intervention required. Monitor. Maintain spo2 >90%  CARDIOVASCULAR A:  1. CAD, s/p CABG P:  1. ecg monitoring. 2. BP goal stated by neurology to be < 140mm Hg.  RENAL A:    P:   1. Monitor I/o's, bmp  GASTROINTESTINAL A:    P:   1. NPO  HEMATOLOGIC A:    P:  1. SCD's. Not a candidate for anticoagulation for dvt prophylaxis  INFECTIOUS A:    P:   1. Monitor for fever, sputum production.   ENDOCRINE A:      P:   1. Glycemic control for bs >180mg   NEUROLOGIC A:   1. ICH P:   1. Neurological monitor, f/u CT  today. Recommendations per neurology.  FAMILY  - Updates: Wife, daughter updated at the bedside.   Elayne SnareMichael B Fin Hupp, MD Pulmonary and Critical Care Medicine [facility] Pager: [phone]  12/29/2017, 8:38 AM

## 2018-01-01 NOTE — H&P (Deleted)
Stroke H&P  Reason for Consult: Acute worsening of left-sided weakness Referring Physician: PMR- Dr. Caren HazySwarts  CC: Acute worsening of left-sided weakness  History is obtained from: Chart, patient's wife at bedside  HPI: Jaime Pierce is a 72 y.o. 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 There is no clear last known normal but most likely he was at his new baseline normal up until 6 or 7 PM yesterday. His wife was in the room with him and slept in that room.  She woke up in the night, and in an attempt to speak with her could not really make sense of his speech as it was very slurred.  She said he was able to stand with great difficulty to use a urinal, which was different than what he had been doing in the prior few days.  He also attempted to drink some water and seemed like he choked on it. He has residual right hemiparesis from the left-sided ICH in October with increased tone in his right arm and right leg for which Botox treatment was being considered. He was recovering from this recent right parietal ICH that happened in middle of January 2019 and regaining some strength on his left side with physical and occupational therapy in the rehabilitation unit. I was called by the rapid response team who were called because of this acute neuro change. I requested that the patient get a stat noncontrast CT of the head as I made my way to evaluate him. The noncontrast CT of the head was performed and shows a new right frontal bleed about 55 cc with some subarachnoid blood and 5 mm midline shift. I have made a decision to emergently move him to the  neurological ICU under the stroke service.  Of note, patient is not on aspirin or heparin. Labs-recent labs are not available to review as the most recent labs were done about a few days ago. He is not on any antihypertensive medication and his blood pressures have been fluctuating from systolics in the 80s and 90s to a few readings of systolics in the 140s and 150s.   LKW: 6 PM on 12/31/2017 tpa given?: no, ICH Premorbid modified Rankin scale (mRS): 4   ROS:  Unable to obtain due to altered mental status.   Past Medical History:  Diagnosis Date  . Coronary artery disease   . Dyspnea on exertion   . Hemorrhagic stroke (HCC)   . Hyperlipidemia   . Hypertension   . Seizures (HCC)   . Stroke (HCC)   . Tobacco abuse    Family History  Problem Relation Age of Onset  . Heart disease Father   . CVA Mother   . Heart disease Maternal Grandfather    Social History:   reports that he quit smoking about 13 months ago. His smoking use included cigarettes. he has never used smokeless tobacco. He reports that he drinks alcohol. He reports that he does not use drugs.  Medications  Current Facility-Administered Medications:  .   stroke: mapping our early stages of recovery book, , Does not apply, Once, Milon DikesArora, Neera Teng, MD .  acetaminophen (TYLENOL) tablet 650 mg, 650 mg, Oral,  Q4H PRN **OR** acetaminophen (TYLENOL) solution 650 mg, 650 mg, Per Tube, Q4H PRN **OR** acetaminophen (TYLENOL) suppository 650 mg, 650 mg, Rectal, Q4H PRN, Milon Dikes, MD .  [DISCONTINUED] acetaminophen (TYLENOL) tablet 650 mg, 650 mg, Oral, Q4H PRN, 650 mg at 12/26/17 0326 **OR** [DISCONTINUED] acetaminophen (TYLENOL) solution 650 mg, 650 mg, Per Tube, Q4H PRN **OR** acetaminophen (TYLENOL) suppository 650 mg, 650 mg, Rectal, Q4H PRN, Angiulli, Mcarthur Rossetti, PA-C .  baclofen (LIORESAL) tablet 20 mg, 20 mg, Oral, TID, Ranelle Oyster, MD, 20 mg at 12/31/17 2027 .  diclofenac sodium (VOLTAREN) 1 % transdermal gel 2 g, 2  g, Topical, TID, Ranelle Oyster, MD, 2 g at 12/31/17 2027 .  diphenhydrAMINE (BENADRYL) capsule 25 mg, 25 mg, Oral, Q6H PRN, Ranelle Oyster, MD, 25 mg at 12/11/17 1209 .  divalproex (DEPAKOTE) DR tablet 500 mg, 500 mg, Oral, Q12H, Angiulli, Mcarthur Rossetti, PA-C, 500 mg at 12/31/17 2027 .  gabapentin (NEURONTIN) capsule 100 mg, 100 mg, Oral, QHS, Ranelle Oyster, MD, 100 mg at 12/31/17 2148 .  hydrocortisone cream 1 %, , Topical, TID PRN, Ranelle Oyster, MD .  labetalol (NORMODYNE,TRANDATE) injection 20 mg, 20 mg, Intravenous, Once **AND** nicardipine (CARDENE) 20mg  in 0.86% saline IV infusion (0.1 mg/ml), 0-15 mg/hr, Intravenous, Continuous, Milon Dikes, MD .  lacosamide (VIMPAT) tablet 100 mg, 100 mg, Oral, BID, Angiulli, Mcarthur Rossetti, PA-C, 100 mg at 12/31/17 2027 .  mometasone-formoterol (DULERA) 200-5 MCG/ACT inhaler 2 puff, 2 puff, Inhalation, BID, Angiulli, Mcarthur Rossetti, PA-C, 2 puff at 12/31/17 2008 .  nystatin (MYCOSTATIN/NYSTOP) topical powder, , Topical, BID, Faith Rogue T, MD .  ondansetron [facility]) tablet 4 mg, 4 mg, Oral, Q6H PRN **OR** ondansetron (ZOFRAN) injection 4 mg, 4 mg, Intravenous, Q6H PRN, Angiulli, Mcarthur Rossetti, PA-C .  pantoprazole (PROTONIX) EC tablet 40 mg, 40 mg, Oral, Daily, Angiulli, Mcarthur Rossetti, PA-C, 40 mg at 12/31/17 0909 .  senna-docusate (Senokot-S) tablet 1 tablet, 1 tablet, Oral, Daily PRN, Love, Pamela S, PA-C .  senna-docusate (Senokot-S) tablet 1 tablet, 1 tablet, Oral, BID, Milon Dikes, MD .  sorbitol 70 % solution 30 mL, 30 mL, Oral, Daily PRN, Angiulli, Mcarthur Rossetti, PA-C  Exam: Current vital signs: BP (!) 156/82 (BP Location: Right Arm)   Pulse 70   Temp 97.8 F (36.6 C) (Oral)   Resp (!) 22   Ht 6' (1.829 m)   Wt 74.7 kg (164 lb 10.9 oz)   SpO2 93%   BMI 22.34 kg/m  Vital signs in last 24 hours: Temp:  [97.8 F (36.6 C)-98.2 F (36.8 C)] 97.8 F (36.6 C) (02/09 0454) Pulse Rate:  [70-87] 70 (02/09 0454) Resp:  [18-22] 22 (02/09 0454) BP:  (99-156)/(70-82) 156/82 (02/09 0454) SpO2:  [93 %] 93 % (02/08 2009) General: Lethargic appearing, eyes open, follows some simple commands, no acute distress. HEENT: Normocephalic, atraumatic, moist oral mucous membranes, clear nares, clear throat Lungs: Scattered rhonchi CVS: S1-S2 heard, regular rate rhythm Abdomen: Soft nondistended nontender Extremities: Warm well perfused with no edema Neurological exam Lethargic, eyes open, follows some simple commands on the right. Speech: Nonverbal. Cranial nerves: Pupils equal round reactive to light, rightward gaze preference with ability to bring the eyes to midline but not look to the left, left lower facial droop.  Left homonymous hemianopsia Motor exam: 2/5 right upper extremity, 2/5 right lower extremities, flaccid 0/5 left upper extremity, 2/5 left lower extremity.  Increased tone in right upper and lower extremity and decreased tone in the  left upper and lower extremity. Sensory: Briskly withdraws to noxious stimulus on the right.  Upon giving noxious stimulation to the left arm he tries to localize from the right.  Weak withdrawal to noxious stimulus on the left lower extremity. Gait and coordination were deferred due to his mental status. NIHSS 1a Level of Conscious.: 1 1b LOC Questions: 2 1c LOC Commands: 1 2 Best Gaze: 1 3 Visual: 2 4 Facial Palsy: 2 5a Motor Arm - left: 4 5b Motor Arm - Right: 2 6a Motor Leg - Left: 2 6b Motor Leg - Right: 2 7 Limb Ataxia: 0 8 Sensory: 0 9 Best Language: 2 10 Dysarthria: 2 11 Extinct. and Inatten.: 2 TOTAL: 25  Labs I have reviewed labs in epic and the results pertinent to this consultation are:  CBC    Component Value Date/Time   WBC 9.5 12/23/2017 0814   RBC 4.24 12/23/2017 0814   HGB 12.9 (L) 12/23/2017 0814   HCT 38.9 (L) 12/23/2017 0814   PLT 363 12/23/2017 0814   MCV 91.7 12/23/2017 0814   MCH 30.4 12/23/2017 0814   MCHC 33.2 12/23/2017 0814   RDW 14.6 12/23/2017 0814    LYMPHSABS 1.1 12/13/2017 0729   MONOABS 1.7 (H) 12/13/2017 0729   EOSABS 0.9 (H) 12/13/2017 0729   BASOSABS 0.1 12/13/2017 0729    CMP     Component Value Date/Time   NA 134 (L) 12/23/2017 0814   K 4.0 12/23/2017 0814   CL 99 (L) 12/23/2017 0814   CO2 24 12/23/2017 0814   GLUCOSE 137 (H) 12/23/2017 0814   GLUCOSE 113 (H) 09/21/2006 0832   BUN 16 12/23/2017 0814   CREATININE 0.77 12/23/2017 0814   CALCIUM 9.0 12/23/2017 0814   PROT 6.3 (L) 12/13/2017 0729   ALBUMIN 2.8 (L) 12/13/2017 0729   AST 22 12/13/2017 0729   ALT 24 12/13/2017 0729   ALKPHOS 47 12/13/2017 0729   BILITOT 1.0 12/13/2017 0729   GFRNONAA >60 12/23/2017 0814   GFRAA >60 12/23/2017 0814    Imaging I have reviewed the images obtained  CT-scan of the brain done today when compared to the one from middle of January shows a new right frontal ICH with some subarachnoid component as well.  Midline shift 4 mm. Also demonstrated is residual right temporal bleed.  Assessment:  72 year old man past history of recent right parietal ICH, prior left-sided ICH in October 2018, seizure, hypertension hyperlipidemia who was in rehabilitation recovering from the right parietal ICH of January 2019, noted to have acute worsening of left-sided weakness, left facial droop and drowsiness. Repeat imaging shows a new right frontal ICH with a subarachnoid component as well as slight midline shift without any signs of herniation. New right cerebral ICH likely secondary to the cerebral amyloid angiopathy.  Plan: Nontraumatic lobar ICH  Acuity: Acute Laterality: right Current suspected etiology: Cerebral amyloid angiopathy Treatment: -Admit to NICU under stroke attending -ICH Score:1 -ICH Volume:55 -BP control goal SYS<140 -PT/OT/ST  -neuromonitoring  CNS Cerebral edema -Close neuro monitoring -consider HTS  Dysarthria Dysphagia following ICH  -NPO until cleared by speech -ST -Advance diet as tolerated -May need  PEG  Toxic encephalopathy -Correct metabolic causes -Monitor  Hemiplegia and hemiparesis following nontraumatic intracerebral hemorrhage affecting right dominant side  Hemiplegia and hemiparesis following nontraumatic intracerebral hemorrhage affecting left non-dominant side  -Continue PT/OT/ST  RESP Possible aspiration pneumonia -Portable chest x-ray -Decision on antibiotics after imaging labs -PCCM consult as per report after the encounter his breathing was  labored. Still pending CXR  CV Essential (primary) hypertension -Aggressive BP control, goal SBP < 140 -Labetalol + Cardene gtt  GI/GU -Gentle hydration  HEME Iron Deficiency Anemia -Monitor -transfuse for hgb < 7  Check PT/INR  ENDO -goal HgbA1c < 7  Fluid/Electrolyte Disorders Hyponatremia-mild.  Monitor. -Replete -Repeat labs  ID Possible Aspiration PNA -CXR -NPO -Monitor  Prophylaxis DVT:scd  GI: na Bowel: doc/senna   Dispo: IP Rehab based on clinical course  Diet: NPO until cleared by speech  Code Status: Full Code    Spoke with daughter and wife at bedside. Showed CT images. Explained the current condition and rationale for NICU transfer and answered all questions to the best of my ability. They will appreciate updates in the day from stroke team after rounds.  Present on arrival: Hemiplegia, hemiparesis, cerebral edema, possible aspiration pneumonia, hyponatremia  -- Milon Dikes, MD [facility] Pager: [phone] If 7pm to 7am, please call on call as listed on AMION.  Patient will be followed by the stroke team.  Please page stroke team MD/NP/PA on [email or web address]  CRITICAL CARE ATTESTATION This patient is critically ill and at significant risk of neurological worsening, death and care requires constant monitoring of vital signs, hemodynamics,respiratory and cardiac monitoring. I spent 55  minutes of neurocritical care time performing neurological assessment, discussion with  family, other specialists and medical decision making of high complexityin the care of  this patient.

## 2018-01-01 NOTE — Discharge Summary (Signed)
Discharge summary job # [id number]

## 2018-01-01 NOTE — Significant Event (Addendum)
Rapid Response Event Note  Overview:Called by bedside RN d/t AMS. Wife at bedside.  Wife said patient woke up around 4:15 and "wasn't himself." She said she gave him a drink of water  and the water dribbled out of his mouth and he started coughing. Time Called: 0545 Arrival Time: 0545 Event Type: Neurologic  Initial Focused Assessment: Pt laying in bed with eyes closed.  Pt will open eyes to physical stimulation.  Pt will say name and place with repeated verbal and physical stimulation.  Pt MAE to painful stimuli.  Pupils 4 and brisk.  Pt with L sided droop, slurred speech, L sided weakness, R sided gaze preference. CBG-100, SBP-140s, HR-60s, RR-15, SpO2 97% on RA.  NIH-26. LSN- difficult to say, probably 6pm last night.   Interventions: CT head-new hemorrhage, placed pt on monitor  Plan of Care (if not transferred):  Pt moved to 4N18  Event Summary: Name of Physician Notified: Dr Allena KatzPatel at 0515(no response)  Name of Consulting Physician Notified: Dr. Wilford CornerArora at [phone]  Outcome: Transferred (Comment)(4N18)  Event End Time: [id number]  Terrilyn SaverHopper, Latiesha Harada Anderson

## 2018-01-01 NOTE — Significant Event (Signed)
4:53 Rapid response RN contacted after charge RN and bedside RN responded to patient's wife c/o change in patient LOC and pt assessed to have altered LOC, delayed responses, slurred speech, and L side weakness; pt wife stated that when she woke up to help pt use urinal she offered the pt a drink of water and he expressed having trouble swallowing BP: 156/82; P: 70  5:15 After RR assessment of pt, recommendation for head CT; on-call MD Allena KatzPatel contacted; no response  5:21 RR order placed for CT head

## 2018-01-01 NOTE — Progress Notes (Signed)
Spoke with PCCM on call Dr. Katrinka BlazingSmith to request PCCM consult for [name].  Pt will be seen shortly by the PCCM team per Dr. Katrinka BlazingSmith.  -- Milon DikesAshish Kalene Cutler, MD [facility] Pager: [phone] If 7pm to 7am, please call on call as listed on AMION.

## 2018-01-02 ENCOUNTER — Inpatient Hospital Stay (HOSPITAL_COMMUNITY): Payer: Medicare HMO

## 2018-01-02 DIAGNOSIS — J9601 Acute respiratory failure with hypoxia: Secondary | ICD-10-CM

## 2018-01-02 DIAGNOSIS — Z515 Encounter for palliative care: Secondary | ICD-10-CM

## 2018-01-02 DIAGNOSIS — J988 Other specified respiratory disorders: Secondary | ICD-10-CM

## 2018-01-02 DIAGNOSIS — G935 Compression of brain: Secondary | ICD-10-CM

## 2018-01-02 DIAGNOSIS — I610 Nontraumatic intracerebral hemorrhage in hemisphere, subcortical: Secondary | ICD-10-CM

## 2018-01-02 DIAGNOSIS — Z7189 Other specified counseling: Secondary | ICD-10-CM

## 2018-01-02 DIAGNOSIS — J9600 Acute respiratory failure, unspecified whether with hypoxia or hypercapnia: Secondary | ICD-10-CM

## 2018-01-02 DIAGNOSIS — J690 Pneumonitis due to inhalation of food and vomit: Secondary | ICD-10-CM

## 2018-01-02 DIAGNOSIS — G936 Cerebral edema: Secondary | ICD-10-CM

## 2018-01-02 LAB — BASIC METABOLIC PANEL
ANION GAP: 10 (ref 5–15)
BUN: 13 mg/dL (ref 6–20)
CHLORIDE: 112 mmol/L — AB (ref 101–111)
CO2: 20 mmol/L — ABNORMAL LOW (ref 22–32)
Calcium: 8.5 mg/dL — ABNORMAL LOW (ref 8.9–10.3)
Creatinine, Ser: 0.67 mg/dL (ref 0.61–1.24)
GFR calc non Af Amer: >60 mL/min (ref >60)
Glucose, Bld: 146 mg/dL — ABNORMAL HIGH (ref 65–99)
POTASSIUM: 4.2 mmol/L (ref 3.5–5.1)
SODIUM: 142 mmol/L (ref 135–145)

## 2018-01-02 LAB — LACTIC ACID, PLASMA
LACTIC ACID, VENOUS: 2.7 mmol/L — AB (ref 0.5–1.9)
LACTIC ACID, VENOUS: 2.1 mmol/L — AB (ref 0.5–1.9)

## 2018-01-02 LAB — PROCALCITONIN: Procalcitonin: <0.1 ng/mL

## 2018-01-02 LAB — CBC
HEMATOCRIT: 38.2 % — AB (ref 39.0–52.0)
Hemoglobin: 12.5 g/dL — ABNORMAL LOW (ref 13.0–17.0)
MCH: 30.4 pg (ref 26.0–34.0)
MCHC: 32.7 g/dL (ref 30.0–36.0)
MCV: 92.9 fL (ref 78.0–100.0)
Platelets: 285 10*3/uL (ref 150–400)
RBC: 4.11 MIL/uL — AB (ref 4.22–5.81)
RDW: 14.8 % (ref 11.5–15.5)
WBC: 12.1 10*3/uL — AB (ref 4.0–10.5)

## 2018-01-02 LAB — GLUCOSE, CAPILLARY
GLUCOSE-CAPILLARY: 134 mg/dL — AB (ref 65–99)
Glucose-Capillary: 70 mg/dL (ref 65–99)

## 2018-01-02 LAB — SODIUM
Sodium: 143 mmol/L (ref 135–145)
Sodium: 148 mmol/L — ABNORMAL HIGH (ref 135–145)
Sodium: 157 mmol/L — ABNORMAL HIGH (ref 135–145)
Sodium: 158 mmol/L — ABNORMAL HIGH (ref 135–145)

## 2018-01-02 LAB — MAGNESIUM
MAGNESIUM: 1.8 mg/dL (ref 1.7–2.4)
Magnesium: 1.8 mg/dL (ref 1.7–2.4)

## 2018-01-02 LAB — PHOSPHORUS
PHOSPHORUS: 2.7 mg/dL (ref 2.5–4.6)
Phosphorus: 3 mg/dL (ref 2.5–4.6)

## 2018-01-02 MED ORDER — MIDAZOLAM HCL 2 MG/2ML IJ SOLN
1.0000 mg | INTRAMUSCULAR | Status: DC | PRN
  Administered 2018-01-03 – 2018-01-07 (×5): 1 mg via INTRAVENOUS
  Filled 2018-01-02 (×7): qty 2

## 2018-01-02 MED ORDER — MIDAZOLAM HCL 2 MG/2ML IJ SOLN
INTRAMUSCULAR | Status: AC
  Administered 2018-01-02: 2 mg
  Filled 2018-01-02: qty 2

## 2018-01-02 MED ORDER — SODIUM CHLORIDE 23.4 % INJECTION (4 MEQ/ML) FOR IV ADMINISTRATION
30.0000 mL | Freq: Once | INTRAVENOUS | Status: AC
  Administered 2018-01-02: 30 mL via INTRAVENOUS
  Filled 2018-01-02: qty 30

## 2018-01-02 MED ORDER — SODIUM CHLORIDE 0.9% FLUSH: 10.0000 mL | INTRAVENOUS | Status: DC | PRN

## 2018-01-02 MED ORDER — FENTANYL CITRATE (PF) 100 MCG/2ML IJ SOLN
INTRAMUSCULAR | Status: AC
  Administered 2018-01-02: 50 ug via INTRAVENOUS
  Filled 2018-01-02: qty 2

## 2018-01-02 MED ORDER — PANTOPRAZOLE SODIUM 40 MG PO PACK
40.0000 mg | PACK | ORAL | Status: DC
  Administered 2018-01-02 – 2018-01-06 (×5): 40 mg
  Filled 2018-01-02 (×5): qty 20

## 2018-01-02 MED ORDER — FENTANYL 2500MCG IN NS 250ML (10MCG/ML) PREMIX INFUSION
25.0000 ug/h | INTRAVENOUS | Status: DC
  Administered 2018-01-02 – 2018-01-04 (×2): 50 ug/h via INTRAVENOUS
  Administered 2018-01-06: 25 ug/h via INTRAVENOUS
  Administered 2018-01-07: 400 ug/h via INTRAVENOUS
  Filled 2018-01-02 (×3): qty 250

## 2018-01-02 MED ORDER — SODIUM CHLORIDE 0.9 % IV BOLUS (SEPSIS)
1000.0000 mL | Freq: Once | INTRAVENOUS | Status: AC
  Administered 2018-01-02: 1000 mL via INTRAVENOUS

## 2018-01-02 MED ORDER — PIPERACILLIN-TAZOBACTAM 3.375 G IVPB
3.3750 g | Freq: Three times a day (TID) | INTRAVENOUS | Status: DC
  Administered 2018-01-02 – 2018-01-07 (×15): 3.375 g via INTRAVENOUS
  Filled 2018-01-02 (×16): qty 50

## 2018-01-02 MED ORDER — VANCOMYCIN HCL IN DEXTROSE 1-5 GM/200ML-% IV SOLN
1000.0000 mg | Freq: Two times a day (BID) | INTRAVENOUS | Status: DC
  Administered 2018-01-03 – 2018-01-04 (×3): 1000 mg via INTRAVENOUS
  Filled 2018-01-02 (×3): qty 200

## 2018-01-02 MED ORDER — SUCCINYLCHOLINE CHLORIDE 20 MG/ML IJ SOLN
100.0000 mg | Freq: Once | INTRAMUSCULAR | Status: AC
  Administered 2018-01-02: 100 mg via INTRAVENOUS
  Filled 2018-01-02: qty 5

## 2018-01-02 MED ORDER — ETOMIDATE 2 MG/ML IV SOLN
0.3000 mg/kg | Freq: Once | INTRAVENOUS | Status: AC
  Administered 2018-01-02: 25 mg via INTRAVENOUS

## 2018-01-02 MED ORDER — HYDRALAZINE HCL 20 MG/ML IJ SOLN
10.0000 mg | INTRAMUSCULAR | Status: DC | PRN
  Administered 2018-01-06: 10 mg via INTRAVENOUS
  Filled 2018-01-02: qty 1

## 2018-01-02 MED ORDER — PRO-STAT SUGAR FREE PO LIQD
30.0000 mL | Freq: Two times a day (BID) | ORAL | Status: DC
  Administered 2018-01-02 – 2018-01-03 (×3): 30 mL
  Filled 2018-01-02 (×2): qty 30

## 2018-01-02 MED ORDER — SODIUM CHLORIDE 0.9 % IV SOLN
3.0000 g | Freq: Three times a day (TID) | INTRAVENOUS | Status: DC
  Filled 2018-01-02: qty 3

## 2018-01-02 MED ORDER — VITAL HIGH PROTEIN PO LIQD
1000.0000 mL | ORAL | Status: DC
  Administered 2018-01-02: 1000 mL

## 2018-01-02 MED ORDER — MIDAZOLAM HCL 2 MG/2ML IJ SOLN
2.0000 mg | Freq: Once | INTRAMUSCULAR | Status: AC
  Administered 2018-01-02: 2 mg via INTRAVENOUS

## 2018-01-02 MED ORDER — FENTANYL BOLUS VIA INFUSION
25.0000 ug | INTRAVENOUS | Status: DC | PRN
  Administered 2018-01-04 – 2018-01-05 (×2): 25 ug via INTRAVENOUS
  Filled 2018-01-02: qty 25

## 2018-01-02 MED ORDER — CHLORHEXIDINE GLUCONATE CLOTH 2 % EX PADS
6.0000 | MEDICATED_PAD | Freq: Every day | CUTANEOUS | Status: DC
  Administered 2018-01-02 – 2018-01-06 (×5): 6 via TOPICAL

## 2018-01-02 MED ORDER — VANCOMYCIN HCL 10 G IV SOLR
1500.0000 mg | Freq: Once | INTRAVENOUS | Status: AC
  Administered 2018-01-02: 1500 mg via INTRAVENOUS
  Filled 2018-01-02: qty 1500

## 2018-01-02 MED ORDER — PIPERACILLIN-TAZOBACTAM 3.375 G IVPB 30 MIN
3.3750 g | Freq: Once | INTRAVENOUS | Status: AC
  Administered 2018-01-02: 3.375 g via INTRAVENOUS
  Filled 2018-01-02: qty 50

## 2018-01-02 MED ORDER — IPRATROPIUM-ALBUTEROL 0.5-2.5 (3) MG/3ML IN SOLN: 3.0000 mL | RESPIRATORY_TRACT | Status: DC | PRN

## 2018-01-02 MED ORDER — FENTANYL CITRATE (PF) 100 MCG/2ML IJ SOLN
50.0000 ug | Freq: Once | INTRAMUSCULAR | Status: AC
  Administered 2018-01-02: 50 ug via INTRAVENOUS

## 2018-01-02 MED ORDER — MIDAZOLAM HCL 2 MG/2ML IJ SOLN: 1.0000 mg | INTRAMUSCULAR | Status: DC | PRN

## 2018-01-02 MED ORDER — SODIUM CHLORIDE 0.9% FLUSH
10.0000 mL | Freq: Two times a day (BID) | INTRAVENOUS | Status: DC
  Administered 2018-01-02 – 2018-01-07 (×10): 10 mL

## 2018-01-02 NOTE — Progress Notes (Signed)
Pharmacy Antibiotic Note  Jaime Pierce is a 72 y.o. male admitted on 01/20/2018, now with concern for aspiration pneumonia.  Pharmacy has been consulted for Zosyn dosing.  Plan: Zosyn 3.375g IV q8h (4 hour infusion).   Temp (24hrs), Avg:98.5 F (36.9 C), Min:97 F (36.1 C), Max:99.6 F (37.6 C)  Recent Labs  Lab 12/27/2017 0720 01/02/18 0528  WBC 11.0* 12.1*  CREATININE 0.70 0.67    Estimated Creatinine Clearance: 89.5 mL/min (by C-G formula based on SCr of 0.67 mg/dL).    Allergies  Allergen Reactions  . Other Dermatitis and Rash    Patient MUST have "HYPOALLERGENIC" SHEETS washed in SCENT-FREE DETERGENT!!! Wife showed me pics on her phone of what develops if he is placed on regular sheets and the patient develops LARGE, "SCALDED" areas that spread!!  . Sulfonamide Derivatives Other (See Comments)    Reaction not recalled by wife     Thank you for allowing pharmacy to be a part of this patient's care.  Vernard GamblesVeronda Kaleena Corrow, PharmD, BCPS  01/02/2018 6:38 AM

## 2018-01-02 NOTE — Progress Notes (Signed)
Aspiration pneumonia, on zosyn. Still spiking fevers. Blood cultures pending. Sputum cultures ordered. Will add Vanc and order MRSA nares screen.

## 2018-01-02 NOTE — Progress Notes (Signed)
STROKE TEAM PROGRESS NOTE   HISTORY OF PRESENT ILLNESS (per record) Jaime Pierce is a 72 y.o. 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 There is no clear last known normal but most likely he was at his new baseline normal up until 6 or 7 PM yesterday. His wife was in the room with him and slept in that room.  She woke up in the night, and in an attempt to speak with her could not really make sense of his speech as it was very slurred.  She said he was able to stand with great difficulty to use a urinal, which was different than what he had been doing in the prior few days.  He also attempted to drink some water and seemed like he choked on it. He has residual right hemiparesis from the left-sided ICH in October with increased tone in his right arm and right leg for which Botox treatment was being considered. He was recovering from this recent right parietal ICH that happened in middle of January 2019 and regaining some strength on his left side with physical and occupational therapy in the rehabilitation unit. I was called by the rapid response team who were called because of this acute neuro change. I requested that the patient get a stat noncontrast CT of the head as I made my way to evaluate him. The noncontrast CT of the head was performed and shows a new right frontal bleed about 55 cc with some subarachnoid blood and 5 mm midline shift. I have made a decision to emergently move him to the neurological ICU under the stroke service.  Of note, patient is not on aspirin or heparin. Labs-recent labs are not available to review as the most  recent labs were done about a few days ago. He is not on any antihypertensive medication and his blood pressures have been fluctuating from systolics in the 80s and 90s to a few readings of systolics in the 140s and 150s.   LKW: 6 PM on 12/31/2017 tpa given?: no, ICH Premorbid modified Rankin scale (mRS): 4     SUBJECTIVE (INTERVAL HISTORY) Multiple family members at the bedside. The patient developed increasing oxygen requirements and inability to protect his airway early this morning requiring emergent intubation. He is currently sedated on fentanyl drip. He has a right gaze preference, he is unable to communicate, and follows minimal commands. I had a long talk with his wife and 2 daughters at the bedside regarding the gravity of the situation. The chance for any meaningful recovery or quality of life at this point is severely limited. I encouraged the family to consider comfort care only.Follow-up CT scan from this morning shows minimal increase in hematoma volume from 80 to 86 cubic cc with slight increase in cytotoxic edema and midline shift. Serum sodium is yet 143 and he has been started on hypertonic saline    OBJECTIVE Temp:  [98.8 F (37.1 C)-100.2 F (37.9 C)] 100.2 F (37.9 C) (02/10 1207) Pulse Rate:  [73-103] 86 (02/10 1200) Cardiac Rhythm: Heart block (02/09 2000) Resp:  [14-33] 31 (02/10 1200) BP: (86-148)/(59-84) 142/81 (02/10 1200) SpO2:  [88 %-100 %] 96 % (  02/10 1200) FiO2 (%):  [40 %-60 %] 40 % (02/10 1149)  CBC:  Recent Labs  Lab 01/05/2018 0720 01/02/18 0528  WBC 11.0* 12.1*  HGB 13.5 12.5*  HCT 39.9 38.2*  MCV 91.3 92.9  PLT 327 285    Basic Metabolic Panel:  Recent Labs  Lab 12/25/2017 0720  01/02/18 0010 01/02/18 0528  NA 138   < > 143 142  K 3.7  --   --  4.2  CL 103  --   --  112*  CO2 23  --   --  20*  GLUCOSE 131*  --   --  146*  BUN 15  --   --  13  CREATININE 0.70  --   --  0.67  CALCIUM 9.0  --   --  8.5*  MG 1.9  --   --  1.8  PHOS  3.2  --   --  3.0   < > = values in this interval not displayed.    Lipid Panel:     Component Value Date/Time   CHOL 173 12/25/2017 0720   TRIG 58 01/03/2018 0720   TRIG 65 09/21/2006 0832   HDL 32 (L) 01/20/2018 0720   CHOLHDL 5.4 12/28/2017 0720   VLDL 12 12/30/2017 0720   LDLCALC 129 (H) 01/11/2018 0720   HgbA1c:  Lab Results  Component Value Date   HGBA1C 5.9 (H) 09/02/2017   Urine Drug Screen:     Component Value Date/Time   LABOPIA NONE DETECTED 12/06/2017 1506   COCAINSCRNUR NONE DETECTED 12/06/2017 1506   LABBENZ NONE DETECTED 12/06/2017 1506   AMPHETMU NONE DETECTED 12/06/2017 1506   THCU NONE DETECTED 12/06/2017 1506   LABBARB NONE DETECTED 12/06/2017 1506    Alcohol Level     Component Value Date/Time   ETH <10 12/06/2017 0950    IMAGING   Ct Head Wo Contrast 12/31/2017 IMPRESSION: 1. Progressive large right frontal hematoma now estimated at 80 cc volume. There is new right lateral ventricular extension and increased local subarachnoid blood. Midline shift measures 6 mm. No hydrocephalus. 2. Sequela of prior lobar hemorrhages in the bilateral parietal lobe. 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 Suspect amyloid angiopathy.   Ct Head Wo Contrast 01/12/2018 IMPRESSION:  1. Right lateral frontal lobe acute hemorrhage measuring up to 5.2 cm, 54 cc. Associated edema and mass effect with partial effacement of right lateral ventricle and 5 mm right-to-left midline shift. No herniation.  2. Small volume subarachnoid hemorrhage over right cerebral convexity.  3. Interval dispersion of right parietal and right temporal hematoma with residual encephalomalacia.  4. Stable chronic left parietal infarction. Stable chronic microvascular ischemic changes and parenchymal volume loss of the brain.    Chest Port 1 View 01/16/2018 IMPRESSION:  1. Mild atelectasis at the bases.  2. COPD.     PHYSICAL EXAM Vitals:   01/02/18 1100 01/02/18 1149 01/02/18  1200 01/02/18 1207  BP: 131/76  (!) 142/81   Pulse: 73  86   Resp: 15  (!) 31   Temp:    100.2 F (37.9 C)  TempSrc:    Axillary  SpO2: 100% 97% 96%    Frail elderly caucasian male not in distress. He is intubated and sedated on fentanyl drip. Afebrile. Head is nontraumatic. Neck is supple without bruit.    Cardiac exam no murmur or gallop. Lungs are clear to auscultation. Distal pulses are well felt. Neurological Exam :  Intubated and sedated and partially opens eyes  and not following any commands. Right gaze preference. Pupils irregular but reactive. Fundi not visualized.  . Left lower facial weakness. Tongue midline. Dense left hemiplegia with minimum withdrawal to painful stimuli. Able to move right upper and lower extremity partially against gravity to painful stimuli but increased tone in spasticity with some tremors and illsustained clonus. All deep tendon reflexes are brisker on the right than the left. Right plantar equivocal left upgoing. ASSESSMENT/PLAN Mr. ANTONINO NIENHUIS is a 72 y.o. male with history of previous ICH felt secondary to cerebral amyloid angiopathy, hyperlipidemia, hypertension, seizures, previous tobacco use, and coronary artery disease presenting with slurred speech. He did not receive IV t-PA due to ICH.  Right lateral frontal lobe acute hemorrhage - felt secondary to cerebral amyloid angiopathy with hematoma expansion and increasing cytotoxic edema and right to left brain herniation.  Resultant  right gaze deviation and left hemiplegia   F/U CT head 01/02/18 - Progressive large right frontal hematoma now estimated at 86 cc volume  MRI head - not performed  MRA head - not performed  Carotid Doppler - not performed  2D Echo - not performed  LDL - not performed  HgbA1c - not performed  VTE prophylaxis - SCDs Diet NPO time specified Fall precautions  No antithrombotic prior to admission, now on No antithrombotic  Ongoing aggressive stroke risk factor  management  Therapy recommendations:  pending  Disposition:  Pending  Hypertension  Stable  Permissive hypertension (OK if < 220/120) but gradually normalize in 5-7 days  Long-term BP goal normotensive   Other Stroke Risk Factors  Advanced age  Former cigarette smoker - quit  ETOH use, advised to drink no more than 1 drink per day.  Hx of ICH 2/2 cerebral amyloid angiopathy  Family hx stroke (mother)  Coronary artery disease   Other Active Problems  None   Plan / Recommendations   Recommend palliative care consult.  CCM to place central line and increase hypertonic saline rate to achieve goal of 150-155.  Discuss with Dr. Hyacinth Meeker and patient's wife and 2 daughters and palliative care team nurse practitioner   Hospital day # 1    I have personally examined this patient, reviewed notes, independently viewed imaging studies, participated in medical decision making and plan of care.ROS completed by me personally and pertinent positives fully documented  I have made any additions or clarifications directly to the above note.  . This unfortunate gentleman has now his third episode of intracerebral hemorrhage in the last 4 months likely from cerebral amyloid angiopathy. Repeat CT scan today at 01/02/18 shows slight increase size of hematoma, cytotoxic edema and midline shift. His prognosis is quite poor. I had a long discussion with the patient's wife, daughter and Dr. Hyacinth Meeker. Family is struggling to make a decision about withdrawal of care or not. I recommend increasing hypertonic saline rate and placing central line   He is unlikely to survive without prolonged aggressive care which include includes life support feeding tube PEG tube and possibly tracheostomy and nursing home.-. Family want full supportive care at the present time but clearly needs time to make decision about goals of care. We will consult palliative care team. This patient is critically ill and at significant  risk of neurological worsening, death and care requires constant monitoring of vital signs, hemodynamics,respiratory and cardiac monitoring, extensive review of multiple databases, frequent neurological assessment, discussion with family, other specialists and medical decision making of high complexity.I have made any additions or clarifications directly  to the above note.This critical care time does not reflect procedure time, or teaching time or supervisory time of PA/NP/Med Resident etc but could involve care discussion time.  I spent 50 minutes of neurocritical care time  in the care of  this patient.      Delia Heady, MD Medical Director [facility] Pager: [phone] 01/02/2018 12:09 PM  To contact Stroke Continuity provider, please refer to [email or web address]. After hours, contact General Neurology

## 2018-01-02 NOTE — Progress Notes (Signed)
[facility]  [facility]ed into to [facility] and given to [place]. Results: pH 7.37, CO2 33.3, PO2 72, HCO3 19.4. Awaiting MD return call.

## 2018-01-02 NOTE — Progress Notes (Signed)
eLink Physician-Brief Progress Note Patient Name: Jaime FuelRichard A Pierce DOB: 07-Apr-1946 MRN: [id number]   Date of Service  01/02/2018  HPI/Events of Note  Patient is on CPAP. The question is why? He is not a good candidate to extubate. He has a large ICH with cerebral edema with midline shift and is on 3% NaCl IV infusion.   eICU Interventions  Will order: 1. 40%/PRVC 14/TV 550/P 5. 2. ABG at 9 PM.  3. Please do not "wean" this patient.      Intervention Category Major Interventions: Respiratory failure - evaluation and management  Tosh Glaze Eugene 01/02/2018, 8:06 PM

## 2018-01-02 NOTE — Progress Notes (Signed)
eLink Physician-Brief Progress Note Patient Name: Jaime Pierce DOB: Apr 19, 1946 MRN: [id number]   Date of Service  01/02/2018  HPI/Events of Note  ABG on 40%/PRVC 14/TV 550/P 5 = 7.37/33.3/77/19.4.  eICU Interventions  Continue present ventilator management.     Intervention Category Major Interventions: Respiratory failure - evaluation and management  Sommer,Steven Eugene 01/02/2018, 9:59 PM

## 2018-01-02 NOTE — Progress Notes (Signed)
Pharmacy Antibiotic Note Jaime Pierce is a 72 y.o. male admitted on 12/24/2017 with right frontal ICH. Has been on Zosyn for treatment of aspiration pneumonia but to add vancomycin due to persistent fevers.  Plan: 1. Vancomycin 1000 IV every 12 hours.  Goal trough 15-20 mcg/mL. 2. Zosyn 3.375g IV q8h (4 hour infusion).  3. Follow up culture data and narrow as feasle; ? If this could be central fever.    Temp (24hrs), Avg:99.7 F (37.6 C), Min:98.4 F (36.9 C), Max:101.2 F (38.4 C)  Recent Labs  Lab 12/25/2017 0720 01/02/18 0528 01/02/18 0727  WBC 11.0* 12.1*  --   CREATININE 0.70 0.67  --   LATICACIDVEN  --  2.7* 2.1*    Estimated Creatinine Clearance: 89.5 mL/min (by C-G formula based on SCr of 0.67 mg/dL).    Allergies  Allergen Reactions  . Other Dermatitis and Rash    Patient MUST have "HYPOALLERGENIC" SHEETS washed in SCENT-FREE DETERGENT!!! Wife showed me pics on her phone of what develops if he is placed on regular sheets and the patient develops LARGE, "SCALDED" areas that spread!!  . Sulfonamide Derivatives Other (See Comments)    Reaction not recalled by wife    Thank you for allowing pharmacy to be a part of this patient's care.  Sheron NightingaleJames A Kyandra Mcclaine 01/02/2018 8:25 PM

## 2018-01-02 NOTE — Progress Notes (Signed)
Lactate 2.7. Will give 1L NS bolus and repeat lactate in 3 hours.

## 2018-01-02 NOTE — Procedures (Signed)
Endotracheal Intubation Procedure Note Indication for endotracheal intubation: airway compromise and impending respiratory failure Sedation: etomidate, fentanyl and midazolam Paralytic: succinylcholine Equipment: Macintosh 3 laryngoscope blade and 7.525mm cuffed endotracheal tube Cricoid Pressure: yes Number of attempts: 2 ETT location confirmed by by auscultation, by CXR and ETCO2 monitor. Tolerated procedure well, vital signs stable throughout.  ** Patient with difficult airway, very anterior cords. **

## 2018-01-02 NOTE — Procedures (Signed)
Central Venous Catheter Insertion Procedure Note Jaime Pierce [id number] [birth year]  Procedure: Insertion of Central Venous Catheter Indications: Assessment of intravascular volume, Drug and/or fluid administration and Frequent blood sampling  Procedure Details Consent: Risks of procedure as well as the alternatives and risks of each were explained to the (patient/caregiver).  Consent for procedure obtained. Time Out: Verified patient identification, verified procedure, site/side was marked, verified correct patient position, special equipment/implants available, medications/allergies/relevent history reviewed, required imaging and test results available.  Performed  Maximum sterile technique was used including antiseptics, cap, gloves, gown, hand hygiene, mask and sheet. Skin prep: Chlorhexidine; local anesthetic administered A antimicrobial bonded/coated triple lumen catheter was placed in the left internal jugular vein using the Seldinger technique. Ultrasound guidance used.Yes.   Catheter placed to 20 cm. Blood aspirated via all 3 ports and then flushed x 3. Line sutured x 2 and dressing applied.  Evaluation Blood flow good Complications: No apparent complications Patient did tolerate procedure well. Chest X-ray ordered to verify placement.  CXR: pending.  Brett CanalesSteve Asuncion Shibata ACNP [facility] Pager [phone] till 1 pm If no answer page [phone] 01/02/2018, 12:51 PM

## 2018-01-02 NOTE — Progress Notes (Signed)
Patient remained stable throughout the night until 0230. Patient O2 saturation began to drop from 98% to 87%. Venturi mask was placed at 55%FiO2. Patient's O2 improved but only for a short while and needed to be switched to a non-rebreather. CCM was contacted due to the increase need for supplemental O2. [place]anks and Hammonds came to bedside and after thorough assessment decided the patient needed to intubated due to failure to protect his airway. Mouth care was performed before intubation and patient was sedated. Intubation was completed at 0530 and fetanyl drip started. Patient remains intubated with family at bedside. Patient is calm and tolerating the ventilator. Hammonds notified at 0600 of the need to change ABT from Unasyn due to patient  Sulfa allergy and she was also made aware of the LA of 2.7.

## 2018-01-02 NOTE — Progress Notes (Signed)
PULMONARY / CRITICAL CARE MEDICINE   Name: Jaime Pierce MRN: [id number] DOB: 08-18-1946    ADMISSION DATE:  12/26/2017 CONSULTATION DATE:  12/29/2017  REFERRING MD:  Dr. Pearlean BrownieSethi, Neurology  CHIEF COMPLAINT:  Altered mental status.  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  He developed progressive Lt sided weakness and unable to speak.  He was found to have new Rt frontal ICH with 5 mm shift.  He required intubation for airway protection. PMHx of HTN, HLD, CAD s/p CABG.  SUBJECTIVE:  Remains on vent support.  Getting set up for CVL to increase hypertonic saline.  VITAL SIGNS: BP (!) 142/81 Comment: MD notified, PRN ordered  Pulse 86   Temp 100.2 F (37.9 C) (Axillary)   Resp (!) 31   SpO2 96%   VENTILATOR SETTINGS: Vent Mode: PSV;CPAP FiO2 (%):  [40 %-60 %] 40 % Set Rate:  [15 bmp] 15 bmp Vt Set:  [550 mL] 550 mL PEEP:  [5 cmH20] 5 cmH20 Pressure Support:  [5 cmH20] 5 cmH20 Plateau Pressure:  [12 cmH20] 12 cmH20  INTAKE / OUTPUT: I/O last 3 completed shifts: In: 2182.5 [I.V.:2127.5; IV Piggyback:55] Out: 2350 [Urine:2050; Emesis/NG output:300]  PHYSICAL EXAMINATION:  General - on vent Eyes - pupils mid point ENT - ETT in place Cardiac - regular, no murmur Chest - no wheeze, rales Abd - soft, non tender Ext - no edema Skin - no rashes Neuro - has some movement in Rt arm, not following commands   LABS:  BMET Recent Labs  Lab 12/25/2017 0720  01/12/2018 1748 01/02/18 0010 01/02/18 0528  NA 138   < > 140 143 142  K 3.7  --   --   --  4.2  CL 103  --   --   --  112*  CO2 23  --   --   --  20*  BUN 15  --   --   --  13  CREATININE 0.70  --   --   --  0.67  GLUCOSE 131*  --   --   --  146*   < > = values in this interval not displayed.    Electrolytes Recent Labs  Lab 01/09/2018 0720 01/02/18 0528  CALCIUM 9.0 8.5*   MG 1.9 1.8  PHOS 3.2 3.0    CBC Recent Labs  Lab 01/04/2018 0720 01/02/18 0528  WBC 11.0* 12.1*  HGB 13.5 12.5*  HCT 39.9 38.2*  PLT 327 285    Coag's Recent Labs  Lab 12/24/2017 0720  APTT 32  INR 1.03    Sepsis Markers Recent Labs  Lab 01/02/18 0528 01/02/18 0727  LATICACIDVEN 2.7* 2.1*  PROCALCITON <0.10  --     ABG No results for input(s): PHART, PCO2ART, PO2ART in the last 168 hours.  Liver Enzymes Recent Labs  Lab 01/12/2018 0720  AST 19  ALT 18  ALKPHOS 50  BILITOT 0.8  ALBUMIN 3.2*    Cardiac Enzymes No results for input(s): TROPONINI, PROBNP in the last 168 hours.  Glucose Recent Labs  Lab 12/27/2017 0506  GLUCAP 100*    Imaging Ct Head Wo Contrast  Result Date: 01/02/2018 CLINICAL DATA:  72 y/o  M; intracranial hemorrhage for follow-up. EXAM: CT HEAD WITHOUT CONTRAST TECHNIQUE: Contiguous axial images were obtained from the base of the skull through the vertex without intravenous  contrast. COMPARISON:  07/21/2018 CT head. FINDINGS: Brain: Stable to minimally increased hematoma within the right lateral frontal lobe measuring 6.5 x 5.6 x 4.5 cm (volume = 86 cm^3). Edema and mass effect partially effaces the right lateral ventricle and results in 6 mm right-to-left midline shift. Stable small volume of hemorrhage pooling in occipital horns of lateral ventricles with some redistribution to the left occipital horn. Stable small volume subarachnoid hemorrhage with some redistribution to left sylvian fissure. Stable ventricle size. Stable sequelae of prior hemorrhage within the bilateral parietal lobes. No new hemorrhage or stroke identified. Vascular: No hyperdense vessel or unexpected calcification. Skull: Normal. Negative for fracture or focal lesion. Sinuses/Orbits: Left maxillary sinus mucous retention cyst. Other: None. IMPRESSION: 1. Stable to minimally increased hematoma within right lateral frontal lobe, 86 cc, previously 80 cc. Difference in  measurements may be due to slice selection. 2. Stable edema and mass effect with 6 mm right-to-left midline shift. 3. Stable small volume of intraventricular and subarachnoid hemorrhage with interval redistribution. 4. Stable ventricle size. 5. Stable chronic sequelae of prior hemorrhage within bilateral parietal lobes. Electronically Signed   By: Mitzi Hansen M.D.   On: 01/02/2018 03:55   Ct Head Wo Contrast  Result Date: Jan 21, 2018 CLINICAL DATA:  Followup intracranial hemorrhage. EXAM: CT HEAD WITHOUT CONTRAST TECHNIQUE: Contiguous axial images were obtained from the base of the skull through the vertex without intravenous contrast. COMPARISON:  Head CT from earlier today FINDINGS: Brain: Progression of acute lobar hemorrhage in the upper right hemisphere. It is difficult to calculate a volume given parenchyma or serum interposed between the high-density clot. Based on previous measurement technique best estimate is 6.4 x 4.5 x 5.5 cm-80 cc volume. There is new extension into the right lateral ventricle. There is small volume local subarachnoid hemorrhage that has increased. Midline shift measures 6 mm. There is a low-density area in the right parietal lobe that correlates with hematoma 12/09/2017 in this area. Sequela of old lobar hemorrhage in the left parietal lobe at the vertex. 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 No hydrocephalus or visible infarct. Vascular: Atherosclerotic calcification.  No high-density vessel. Skull: No acute or aggressive finding. Sinuses/Orbits: Negative Other: Critical Value/emergent results were called by telephone at the time of interpretation on 01/21/2018 at 12:45 pm to PA Rinehuls , who verbally acknowledged these results. IMPRESSION: 1. Progressive large right frontal hematoma now estimated at 80 cc volume. There is new right lateral ventricular extension and increased local subarachnoid blood. Midline shift measures 6 mm. No  hydrocephalus. 2. Sequela of prior lobar hemorrhages in the bilateral parietal lobe. 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 Suspect amyloid angiopathy. Electronically Signed   By: Marnee Spring M.D.   On: 01-21-18 12:47   Dg Chest Port 1 View  Result Date: 01/02/2018 CLINICAL DATA:  Endotracheal tube placement. EXAM: PORTABLE CHEST 1 VIEW COMPARISON:  Chest radiograph performed earlier today at 4:29 a.m. FINDINGS: The patient's endotracheal tube is seen ending 5 cm above the carina. An enteric tube is noted extending below the diaphragm, with the side port about the gastroesophageal junction. Persistent mild left basilar airspace opacity may reflect pneumonia. No pleural effusion or pneumothorax is seen. The cardiomediastinal silhouette is normal in size. The patient is status post median sternotomy. No acute osseous abnormalities are identified. IMPRESSION: 1. Endotracheal tube seen ending 5 cm above the carina. 2. Enteric tube noted extending below the diaphragm, with the side port about the gastroesophageal junction. 3. Persistent mild  left basilar airspace opacity may reflect pneumonia. Electronically Signed   By: Roanna Raider M.D.   On: 01/02/2018 05:42   Dg Chest Port 1 View  Result Date: 01/02/2018 CLINICAL DATA:  Acute onset of respiratory failure. EXAM: PORTABLE CHEST 1 VIEW COMPARISON:  Chest radiograph performed 01/15/2018 FINDINGS: The lungs are well-aerated. Mild bibasilar opacities may reflect atelectasis or mild pneumonia. There is no evidence of pleural effusion or pneumothorax. The cardiomediastinal silhouette is normal in size. The patient is status post median sternotomy. No acute osseous abnormalities are seen. IMPRESSION: Mild bibasilar opacities may reflect atelectasis or mild pneumonia. Electronically Signed   By: Roanna Raider M.D.   On: 01/02/2018 04:43   Dg Abd Portable 1v  Result Date: 01/02/2018 CLINICAL DATA:  Orogastric tube placement. EXAM: PORTABLE  ABDOMEN - 1 VIEW COMPARISON:  None. FINDINGS: The patient's enteric tube is noted ending overlying the body of the stomach, with the side port about the gastroesophageal junction. This could be advanced at least 4 cm, as deemed clinically appropriate. The visualized bowel gas pattern is grossly unremarkable. No free intra-abdominal air is seen, though evaluation for free air is limited on a single supine view. No acute osseous abnormalities are identified. IMPRESSION: Enteric tube noted ending overlying the body of the stomach, with the side port about the gastroesophageal junction. This could be advanced at least 4 cm, as deemed clinically appropriate. Electronically Signed   By: Roanna Raider M.D.   On: 01/02/2018 05:43     STUDIES:  CT head 2/09 >> Rt lateral frontal hemorrhage 54 cc, edema and mass effect with 5 mm Rt to Lt shift, small volume SAH CT head 2/10 >> Increased Rt lateral frontal hemorrhage, 86 cc, 6 mm Rt to Lt shift  CULTURES: Blood 2/10 >>   ANTIBIOTICS: Zosyn 2/10 >>   SIGNIFICANT EVENTS: 2/09 Transfer from rehab to ICU 2/10 place CVL, increase 3% saline  LINES/TUBES: ETT 2/09 >>  Lt IJ CVL 2/10 >>   DISCUSSION: 72 yo with recurrent ICH with cerebral edema and midline shift complicated by compromised airway and aspiration pneumonia.  Has progression of bleeding.  Neurology recommending increasing hypertonic saline.  ASSESSMENT / PLAN:  Recurrent ICH with cerebral edema. Hx of seizures. - place CVL for hypertonic saline - continue valproate - f/u neuro imaging per neurology  Hypertension. Hx of CAD. - goal SBP < 140  Acute respiratory failure with compromised airway. - continue vent support  Aspiration pneumonia. - day 1 of zosyn - f/u cultures  DVT prophylaxis - SCDs SUP - Protonix Nutrition - tube feeds Goals of care - full code  Updated pt's family at bedside.  They understand that his prognosis for meaningful neurologic recovery is poor, but  are not ready to make any further decisions about goals of care at this time.  CC time 32 minutes  Coralyn Helling, MD [facility] Pulmonary/Critical Care 01/02/2018, 12:49 PM Pager:  [phone] After 3pm call: [phone]

## 2018-01-02 NOTE — Progress Notes (Signed)
SLP Cancellation Note  Patient Details Name: Jaime Pierce MRN: [id number] DOB: 23-Feb-1946   Cancelled treatment:       Reason Eval/Treat Not Completed: Medical issues which prohibited therapy. Pt intubated. SLP will s/o; please reorder when appropriate.  Rondel BatonMary Beth Marcedes Tech, TennesseeMS, CCC-SLP Speech-Language Pathologist [phone]   Arlana LindauMary E Jaloni Sorber 01/02/2018, 10:22 AM

## 2018-01-02 NOTE — Progress Notes (Signed)
[facility]. At time of my exam, patient on 100% NRB and is almost unarousable even to sternal rub. Patient has NO gag or cough to suction of posterior oropharynx. Rhonchi b/l on lung auscultation. Discussed intubation with family who verbally agreed. Patient tolerated procedure well.

## 2018-01-02 NOTE — Progress Notes (Signed)
OT Cancellation    01/02/18 1000  OT Visit Information  Last OT Received On 01/02/18  Reason Eval/Treat Not Completed Medical issues which prohibited therapy. RN reporting pt not appropriate today. Will return tomorrow.    Rayel Santizo MSOT, OTR/L Acute Rehab Pager: [phone] Office: [phone]

## 2018-01-02 NOTE — Progress Notes (Addendum)
Per Dr. Pearlean BrownieSethi blood pressure goals are now Sys <160. Orders updated. Diego Delancey, [place] ScrapeSarah E, RN

## 2018-01-02 NOTE — Consult Note (Signed)
Consultation Note Date: 01/02/2018   Patient Name: Jaime Pierce  DOB: 1946/06/24  MRN: [id number]  Age / Sex: 72 y.o., male  PCP: Plotnikov, Evie Lacks, MD Referring Physician: Garvin Fila, MD  Reason for Consultation: Establishing goals of care  HPI/Patient Profile: 72 y.o. male  with past medical history of recent R parietal ICH from which he was recovering in rehab, cerebral amyloid angiopathy, L sided ICH in October, seizures, HTN, hyperlipidemia admitted on 01/03/2018 with new frontal ICH with midline shift that presented as slurred speech, choking on some water, altered mental status. Repeat CT on 2/10 showed increased blood volume and increasing L to R shift an Respiratory status decompensated requiring intubation. Concern for aspiration pneumonia.  Clinical Assessment and Goals of Care:  I have reviewed medical records including EPIC notes, labs and imaging, received report from Dr. Leonie Man, assessed the patient and then met at the bedside along with patient's spouse and 2 daughters to discuss diagnosis prognosis, GOC, EOL wishes, disposition and options.  I introduced Palliative Medicine as specialized medical care for people living with serious illness. It focuses on providing relief from the symptoms and stress of a serious illness. The goal is to improve quality of life for both the patient and the family.  We discussed a brief life review of the patient. He was retired and enjoyed caring for his wife of 28 years. In his free time he enjoyed fishing.   As far as functional and nutritional status - he enjoyed eating. He was recovering functionally from his 2 previous ICHs in the last few months. He was in rehab when this third stroke occurred. He was able to actively participate in all therapies. His spouse notes that after each stroke he had recovered far beyond his 28 expectations. He was  not yet ambulating on his own.   We discussed their current illness and what it means in the larger context of their on-going co-morbidities.  Natural disease trajectory and expectations at EOL were discussed. Family notes that they understand that patient has a poor prognosis. They are clear that patient would not want a tracheostomy or PEG tube. However, given patient's success and recover from previous ICHs they are having difficulty accepting that he will not have acceptable level of recovery this time.   Discussed that patient may be in a different place that he was from previous ICH's. Patient has not previously required intubation.   I attempted to elicit values and goals of care important to the patient.  Wife notes that it would be important for patient to be able to interact with his family. He would not want to live in a nursing facility.   The difference between aggressive medical intervention and comfort care was explained. Family is not ready to transition to comfort care. They would like to continue current care in hopes that patient recovers some mental status. They did ask appropriate questions indicating they are considering this transition (asked about details regarding where patient would receive comfort care, if  could have Hospice care at home, etc).   Advanced directives, concepts specific to code status, artifical feeding and hydration, and rehospitalization were considered and discussed. Family agrees that if patient were to experience cardiac arrest despite all current interventions, they would not want further resuscitation including code blue, CPR, cardiac medications. They would rather he experience peaceful death. Agree to order for limited code due to patient is currently intubated.  Hospice and Palliative Care services outpatient were explained and offered.  Questions and concerns were addressed.  Hard Choices booklet left for review. The family was encouraged to call  with questions or concerns.   Primary Decision Maker NEXT OF KIN - patient's spouse- Butch Penny    SUMMARY OF RECOMMENDATIONS -Continue current care, family is reasonable- just need time to accept poor prognosis -Limits are set at no trach, no PEG -Family does note that would not want pt to live long term in nursing facility- will continue to discuss this is patient's likely track with family if aggressive care is continued -Limited code d/t pt currently intubated- no other resuscitative efforts if patient were to experience pulseless state  -PMT will continue to follow and discuss [place]     Code Status/Advance Care Planning:  DNR   Additional Recommendations (Limitations, Scope, Preferences):  No Tracheostomy  Prognosis:    Unable to determine  Discharge Planning: To Be Determined  Primary Diagnoses: Present on Admission: . ICH (intracerebral hemorrhage) ([place])   I have reviewed the medical record, interviewed the patient and family, and examined the patient. The following aspects are pertinent.  Past Medical History:  Diagnosis Date  . Coronary artery disease   . Dyspnea on exertion   . Hemorrhagic stroke ([place])   . Hyperlipidemia   . Hypertension   . Seizures ([place])   . Stroke ([place])   . Tobacco abuse    Social History   Socioeconomic History  . Marital status: Married    Spouse name: Not on file  . Number of children: 2  . Years of education: 75  . Highest education level: Some college, no degree  Social Needs  . Financial resource strain: Not on file  . Food insecurity - worry: Not on file  . Food insecurity - inability: Not on file  . Transportation needs - medical: Not on file  . Transportation needs - non-medical: Not on file  Occupational History  . Occupation: Retired    Comment: [employer]  Tobacco Use  . Smoking status: Former Smoker    Types: Cigarettes    Last attempt to quit: 2018    Years since quitting: 1.1  . Smokeless tobacco: Never Used    Substance and Sexual Activity  . Alcohol use: Yes    Alcohol/week: 0.0 oz    Comment: occasional  . Drug use: No  . Sexual activity: Yes  Other Topics Concern  . Not on file  Social History Narrative   Avid fisherman   Lives at home with his wife & dog   Right handed   Drinks 1 cup of caffeine daily   Family History  Problem Relation Age of Onset  . Heart disease Father   . CVA Mother   . Heart disease Maternal Grandfather    Scheduled Meds: . Chlorhexidine Gluconate Cloth  6 each Topical Daily  . feeding supplement (PRO-STAT SUGAR FREE 64)  30 mL Per Tube BID  . feeding supplement (VITAL HIGH PROTEIN)  1,000 mL Per Tube Q24H  . mouth rinse  15  mL Mouth Rinse BID  . pantoprazole sodium  40 mg Per Tube Q24H  . sodium chloride flush  10-40 mL Intracatheter Q12H   Continuous Infusions: . fentaNYL infusion INTRAVENOUS 25 mcg/hr (01/02/18 1400)  . niCARDipine Stopped (01/02/18 0500)  . piperacillin-tazobactam (ZOSYN)  IV 3.375 g (01/02/18 1233)  . sodium chloride (hypertonic) 100 mL/hr at 01/02/18 1400  . valproate sodium 500 mg (01/02/18 1324)   PRN Meds:.[DISCONTINUED] acetaminophen **OR** acetaminophen (TYLENOL) oral liquid 160 mg/5 mL **OR** acetaminophen, fentaNYL, hydrALAZINE, ipratropium-albuterol, midazolam, sodium chloride flush Medications Prior to Admission:  Prior to Admission medications   Medication Sig Start Date End Date Taking? Authorizing Provider  acetaminophen (TYLENOL) 325 MG tablet Take by mouth every 4 (four) hours as needed.   Yes [provider]  albuterol (PROAIR HFA) 108 (90 Base) MCG/ACT inhaler 2 puffs up to every 4 hours if can't catch your breath Patient taking differently: Inhale 1-2 puffs into the lungs every 4 (four) hours as needed for wheezing or shortness of breath.  03/25/17  Yes Tanda Rockers, MD  b complex vitamins tablet Take 1 tablet by mouth daily. 10/22/17  Yes Plotnikov, Evie Lacks, MD  baclofen (LIORESAL) 20 MG tablet Take  1 tablet (20 mg total) by mouth 3 (three) times daily. 11/08/17  Yes Meredith Staggers, MD  budesonide-formoterol [facility]) 160-4.5 MCG/ACT inhaler Take 2 puffs first thing in am and then another 2 puffs about 12 hours later. Patient taking differently: Inhale 2 puffs into the lungs 2 (two) times daily.  03/25/17  Yes Tanda Rockers, MD  Cholecalciferol (VITAMIN D3) 2000 units capsule Take 1 capsule (2,000 Units total) by mouth daily. 10/22/17  Yes Plotnikov, Evie Lacks, MD  diclofenac sodium (VOLTAREN) 1 % GEL Apply 2 g topically 4 (four) times daily as needed (Pain). 11/08/17  Yes Meredith Staggers, MD  divalproex (DEPAKOTE) 500 MG DR tablet Take 1 tablet (500 mg total) by mouth daily. 11/10/17  Yes Garvin Fila, MD  irbesartan (AVAPRO) 75 MG tablet Take 1 tablet (75 mg total) by mouth daily. 10/22/17  Yes Plotnikov, Evie Lacks, MD  metoprolol tartrate (LOPRESSOR) 25 MG tablet Take 0.5 tablets (12.5 mg total) by mouth 2 (two) times daily. 11/15/17  Yes Plotnikov, Evie Lacks, MD  nitroGLYCERIN (NITROSTAT) 0.4 MG SL tablet Place 1 tablet (0.4 mg total) under the tongue every 5 (five) minutes as needed for chest pain. 06/30/17 10/24/21 Yes Plotnikov, Evie Lacks, MD   Allergies  Allergen Reactions  . Other Dermatitis and Rash    Patient MUST have "HYPOALLERGENIC" SHEETS washed in SCENT-FREE DETERGENT!!! Wife showed me pics on her phone of what develops if he is placed on regular sheets and the patient develops LARGE, "SCALDED" areas that spread!!  . Sulfonamide Derivatives Other (See Comments)    Reaction not recalled by wife   Review of Systems  Unable to perform ROS: Intubated    Physical Exam  Pulmonary/Chest:  intubated  Neurological:  Not responsive  Skin: Skin is warm and dry.  Nursing note and vitals reviewed.   Vital Signs: BP 138/68   Pulse 83   Temp 100.2 F (37.9 C) (Axillary)   Resp 18   SpO2 96%  Pain Assessment: CPOT       SpO2: SpO2: 96 % O2 Device:SpO2: 96 % O2  Flow Rate: .O2 Flow Rate (L/min): 6 L/min  IO: Intake/output summary:   Intake/Output Summary (Last 24 hours) at 01/02/2018 1409 Last data filed at 01/02/2018 1400 Gross per  24 hour  Intake 3686.96 ml  Output 2700 ml  Net 986.96 ml    LBM: Last BM Date: 12/31/17 Baseline Weight:   Most recent weight:       Palliative Assessment/Data: PPS: 10%     Thank you for this consult. Palliative medicine will continue to follow and assist as needed.   Time In: 1300 Time Out: 1415 Time Total: 75 minutes Greater than 50%  of this time was spent counseling and coordinating care related to the above assessment and plan.  Signed by: Mariana Kaufman, AGNP-C Palliative Medicine    Please contact Palliative Medicine Team phone at [phone] for questions and concerns.  For individual provider: See Shea Evans

## 2018-01-02 NOTE — Progress Notes (Signed)
Jaime Pierce with neurology notified of NA 143. No new orders. Plan to place central line today in the am.

## 2018-01-03 ENCOUNTER — Inpatient Hospital Stay (HOSPITAL_COMMUNITY): Payer: Medicare HMO

## 2018-01-03 ENCOUNTER — Inpatient Hospital Stay (HOSPITAL_COMMUNITY): Payer: Medicare HMO | Admitting: Speech Pathology

## 2018-01-03 ENCOUNTER — Ambulatory Visit: Payer: Medicare HMO | Admitting: Physical Therapy

## 2018-01-03 ENCOUNTER — Inpatient Hospital Stay (HOSPITAL_COMMUNITY): Payer: Medicare HMO | Admitting: Physical Therapy

## 2018-01-03 ENCOUNTER — Encounter: Payer: Medicare HMO | Admitting: Occupational Therapy

## 2018-01-03 DIAGNOSIS — I610 Nontraumatic intracerebral hemorrhage in hemisphere, subcortical: Secondary | ICD-10-CM

## 2018-01-03 DIAGNOSIS — J690 Pneumonitis due to inhalation of food and vomit: Secondary | ICD-10-CM

## 2018-01-03 DIAGNOSIS — J988 Other specified respiratory disorders: Secondary | ICD-10-CM

## 2018-01-03 DIAGNOSIS — R509 Fever, unspecified: Secondary | ICD-10-CM

## 2018-01-03 DIAGNOSIS — I611 Nontraumatic intracerebral hemorrhage in hemisphere, cortical: Principal | ICD-10-CM

## 2018-01-03 DIAGNOSIS — G936 Cerebral edema: Secondary | ICD-10-CM

## 2018-01-03 DIAGNOSIS — I680 Cerebral amyloid angiopathy: Secondary | ICD-10-CM

## 2018-01-03 DIAGNOSIS — I615 Nontraumatic intracerebral hemorrhage, intraventricular: Secondary | ICD-10-CM

## 2018-01-03 DIAGNOSIS — G935 Compression of brain: Secondary | ICD-10-CM

## 2018-01-03 LAB — URINALYSIS, COMPLETE (UACMP) WITH MICROSCOPIC
BILIRUBIN URINE: NEGATIVE
GLUCOSE, UA: NEGATIVE mg/dL
KETONES UR: NEGATIVE mg/dL
LEUKOCYTES UA: NEGATIVE
NITRITE: NEGATIVE
PH: 5 (ref 5.0–8.0)
Protein, ur: NEGATIVE mg/dL
SPECIFIC GRAVITY, URINE: 1.021 (ref 1.005–1.030)
SQUAMOUS EPITHELIAL / LPF: NONE SEEN

## 2018-01-03 LAB — GLUCOSE, CAPILLARY
GLUCOSE-CAPILLARY: 102 mg/dL — AB (ref 65–99)
GLUCOSE-CAPILLARY: 108 mg/dL — AB (ref 65–99)
GLUCOSE-CAPILLARY: 132 mg/dL — AB (ref 65–99)
GLUCOSE-CAPILLARY: 144 mg/dL — AB (ref 65–99)
Glucose-Capillary: 100 mg/dL — ABNORMAL HIGH (ref 65–99)
Glucose-Capillary: 130 mg/dL — ABNORMAL HIGH (ref 65–99)
Glucose-Capillary: 132 mg/dL — ABNORMAL HIGH (ref 65–99)

## 2018-01-03 LAB — POCT I-STAT 3, ART BLOOD GAS (G3+)
ACID-BASE DEFICIT: 3 mmol/L — AB (ref 0.0–2.0)
Acid-base deficit: 5 mmol/L — ABNORMAL HIGH (ref 0.0–2.0)
Bicarbonate: 19.4 mmol/L — ABNORMAL LOW (ref 20.0–28.0)
Bicarbonate: 21.1 mmol/L (ref 20.0–28.0)
O2 SAT: 99 %
O2 Saturation: 94 %
PCO2 ART: 35.5 mmHg (ref 32.0–48.0)
PH ART: 7.353 (ref 7.350–7.450)
PO2 ART: 141 mmHg — AB (ref 83.0–108.0)
Patient temperature: 101.4
TCO2: 20 mmol/L — AB (ref 22–32)
TCO2: 22 mmol/L (ref 22–32)
pCO2 arterial: 32.8 mmHg (ref 32.0–48.0)
pH, Arterial: 7.422 (ref 7.350–7.450)
pO2, Arterial: 79 mmHg — ABNORMAL LOW (ref 83.0–108.0)

## 2018-01-03 LAB — CBC
HCT: 34.6 % — ABNORMAL LOW (ref 39.0–52.0)
Hemoglobin: 10.9 g/dL — ABNORMAL LOW (ref 13.0–17.0)
MCH: 30.4 pg (ref 26.0–34.0)
MCHC: 31.5 g/dL (ref 30.0–36.0)
MCV: 96.6 fL (ref 78.0–100.0)
PLATELETS: 193 10*3/uL (ref 150–400)
RBC: 3.58 MIL/uL — AB (ref 4.22–5.81)
RDW: 15.7 % — ABNORMAL HIGH (ref 11.5–15.5)
WBC: 15.4 10*3/uL — ABNORMAL HIGH (ref 4.0–10.5)

## 2018-01-03 LAB — LACTIC ACID, PLASMA
LACTIC ACID, VENOUS: 2.1 mmol/L — AB (ref 0.5–1.9)
Lactic Acid, Venous: 2.2 mmol/L (ref 0.5–1.9)

## 2018-01-03 LAB — BASIC METABOLIC PANEL
Anion gap: 11 (ref 5–15)
BUN: 20 mg/dL (ref 6–20)
CALCIUM: 8.7 mg/dL — AB (ref 8.9–10.3)
CO2: 21 mmol/L — ABNORMAL LOW (ref 22–32)
CREATININE: 0.78 mg/dL (ref 0.61–1.24)
Chloride: 126 mmol/L — ABNORMAL HIGH (ref 101–111)
GFR calc non Af Amer: >60 mL/min (ref >60)
Glucose, Bld: 137 mg/dL — ABNORMAL HIGH (ref 65–99)
Potassium: 2.9 mmol/L — ABNORMAL LOW (ref 3.5–5.1)
SODIUM: 158 mmol/L — AB (ref 135–145)

## 2018-01-03 LAB — PROCALCITONIN

## 2018-01-03 LAB — SODIUM
SODIUM: 157 mmol/L — AB (ref 135–145)
Sodium: 157 mmol/L — ABNORMAL HIGH (ref 135–145)
Sodium: 159 mmol/L — ABNORMAL HIGH (ref 135–145)

## 2018-01-03 LAB — PHOSPHORUS
PHOSPHORUS: 2.7 mg/dL (ref 2.5–4.6)
PHOSPHORUS: 1.9 mg/dL — AB (ref 2.5–4.6)

## 2018-01-03 LAB — MRSA PCR SCREENING: MRSA BY PCR: NEGATIVE

## 2018-01-03 LAB — MAGNESIUM
MAGNESIUM: 2 mg/dL (ref 1.7–2.4)
Magnesium: 2.1 mg/dL (ref 1.7–2.4)

## 2018-01-03 MED ORDER — VITAL AF 1.2 CAL PO LIQD
1000.0000 mL | ORAL | Status: DC
  Administered 2018-01-03 – 2018-01-07 (×6): 1000 mL

## 2018-01-03 MED ORDER — ATORVASTATIN CALCIUM 40 MG PO TABS
40.0000 mg | ORAL_TABLET | Freq: Every day | ORAL | Status: DC
  Administered 2018-01-03 – 2018-01-06 (×4): 40 mg
  Filled 2018-01-03 (×4): qty 1

## 2018-01-03 MED ORDER — LACOSAMIDE 200 MG/20ML IV SOLN
100.0000 mg | Freq: Two times a day (BID) | INTRAVENOUS | Status: DC
  Administered 2018-01-03 – 2018-01-07 (×8): 100 mg via INTRAVENOUS
  Filled 2018-01-03 (×10): qty 10

## 2018-01-03 MED ORDER — POTASSIUM CHLORIDE 20 MEQ/15ML (10%) PO SOLN
40.0000 meq | ORAL | Status: AC
  Administered 2018-01-03 (×3): 40 meq via ORAL
  Filled 2018-01-03 (×3): qty 30

## 2018-01-03 MED ORDER — VALPROATE SODIUM 500 MG/5ML IV SOLN
1000.0000 mg | Freq: Once | INTRAVENOUS | Status: AC
  Administered 2018-01-03: 1000 mg via INTRAVENOUS
  Filled 2018-01-03: qty 10

## 2018-01-03 MED ORDER — ORAL CARE MOUTH RINSE
15.0000 mL | Freq: Four times a day (QID) | OROMUCOSAL | Status: DC
  Administered 2018-01-04 – 2018-01-07 (×13): 15 mL via OROMUCOSAL

## 2018-01-03 MED ORDER — BACLOFEN 10 MG PO TABS
10.0000 mg | ORAL_TABLET | Freq: Three times a day (TID) | ORAL | Status: DC
  Administered 2018-01-03 – 2018-01-07 (×11): 10 mg via ORAL
  Filled 2018-01-03 (×11): qty 1

## 2018-01-03 MED ORDER — BACLOFEN 1 MG/ML ORAL SUSPENSION: 10.0000 mg | Freq: Three times a day (TID) | ORAL | Status: DC

## 2018-01-03 MED ORDER — GABAPENTIN 100 MG PO CAPS
100.0000 mg | ORAL_CAPSULE | Freq: Every day | ORAL | Status: DC
  Administered 2018-01-03 – 2018-01-06 (×4): 100 mg via ORAL
  Filled 2018-01-03 (×4): qty 1

## 2018-01-03 MED ORDER — CHLORHEXIDINE GLUCONATE 0.12% ORAL RINSE (MEDLINE KIT)
15.0000 mL | Freq: Two times a day (BID) | OROMUCOSAL | Status: DC
  Administered 2018-01-03 – 2018-01-07 (×8): 15 mL via OROMUCOSAL

## 2018-01-03 MED ORDER — SODIUM BICARBONATE 650 MG PO TABS
650.0000 mg | ORAL_TABLET | Freq: Two times a day (BID) | ORAL | Status: DC
  Administered 2018-01-03 – 2018-01-07 (×9): 650 mg
  Filled 2018-01-03 (×9): qty 1

## 2018-01-03 MED ORDER — SODIUM CHLORIDE 0.9 % IV SOLN
INTRAVENOUS | Status: DC
  Administered 2018-01-03: 15:00:00 via INTRAVENOUS

## 2018-01-03 MED ORDER — VALPROATE SODIUM 500 MG/5ML IV SOLN
15.0000 mg/kg/d | Freq: Three times a day (TID) | INTRAVENOUS | Status: DC
  Administered 2018-01-04 – 2018-01-07 (×9): 375 mg via INTRAVENOUS
  Filled 2018-01-03 (×11): qty 3.75

## 2018-01-03 NOTE — Progress Notes (Signed)
S/O: Called to bedside for seizure activity witnessed by nursing and family. Intubated. Was not following commands today per Stroke Team note. Na was 158 this AM.   Meds from rehab list prior to discharge to ICU Baclofen 20 mg TID Voltaren gel TID Depakote 500 mg BID Neurontin 100 mg QHS Vimpat 100 mg BID Pantoprazole  Exam: BP (!) 113/55   Pulse 64   Temp (!) 100.6 F (38.1 C) (Axillary)   Resp 17   Wt 75.3 kg (166 lb 0.1 oz)   SpO2 95%   BMI 22.51 kg/m   Ment: Obtunded. Not responding to commands. Nonverbal CN: Right gaze preference Motor: Rhythmic twitching of RUE, approximately 1-2 Hz Sensory: Slight movement to noxious bilateral upper ext. Weak withdrawal at 2/5 to noxious bilateral lower extremities, brisker on the left  A/R: Right frontal lobe large acute ICH with IVH - secondary to cerebral amyloid angiopathy with hematoma expansion and increasing cytotoxic edema and right to left midline shift. Now with recurrent seizure 1. STAT LTM EEG ordered. EEG technician has been called in.  2. Depakote supplemental load 15 mg/kg x 1 now 3. Change current Depakote scheduled dosage to 5 mg IV TID 4. Restart Vimpat at 100 mg IV BID 5. Restart Neurontin 100 mg qhs per OG tube 6. Restart Baclofen at lower dose than at rehab to limit sedation but to improve spasticity. Ordered scheduled at 10 mg TID per OG tube. 7. Discussed with family at the bedside  Electronically signed: Dr. Caryl PinaEric Jojo Geving

## 2018-01-03 NOTE — Plan of Care (Signed)
Pt currently on tube feeds via OG, will need to consider another form of nutrition once patient is extubated.  Heloise PurpuraSusan Azion Centrella RN

## 2018-01-03 NOTE — Progress Notes (Signed)
STAT LTM started; Dr Amada JupiterKirkpatrick notified. Family and nurse educated on event button.

## 2018-01-03 NOTE — Progress Notes (Signed)
PULMONARY / CRITICAL CARE MEDICINE   Name: Jaime Pierce MRN: [id number] DOB: 04-Jul-1946    ADMISSION DATE:  12/25/2017 CONSULTATION DATE:  12/28/2017  REFERRING MD:  Dr. Pearlean BrownieSethi, Neurology  CHIEF COMPLAINT:  Altered mental status.  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  He developed progressive Lt sided weakness and unable to speak.  He was found to have new Rt frontal ICH with 5 mm shift.  He required intubation for airway protection. PMHx of HTN, HLD, CAD s/p CABG.  SUBJECTIVE:  He is currently receiving 3% saline. No seizure activity. Sedated on 25 mcg Fentanyl.  Intubated and mechanically ventilated. On no antihypertensive  VITAL SIGNS: BP (!) 142/67   Pulse 67   Temp 99.1 F (37.3 C) (Axillary)   Resp 15   Wt 166 lb 0.1 oz (75.3 kg)   SpO2 97%   BMI 22.51 kg/m   VENTILATOR SETTINGS: Vent Mode: PRVC FiO2 (%):  [40 %] 40 % Set Rate:  [14 bmp] 14 bmp Vt Set:  [550 mL] 550 mL PEEP:  [5 cmH20] 5 cmH20 Pressure Support:  [5 cmH20] 5 cmH20 Plateau Pressure:  [7 cmH20-10 cmH20] 10 cmH20  INTAKE / OUTPUT: I/O last 3 completed shifts: In: 5061.1 [I.V.:3166.1; NG/GT:660; IV Piggyback:1235] Out: 3545 [Urine:3245; Emesis/NG output:300]  PHYSICAL EXAMINATION:  General - orally intubated and mechanically ventilated. Cardiac - median sternotomy scar. PMI unremarkable. RRR, no m g or rub Chest - unlabored, symmetric air movement, no wheezes Abd - soft, non tender, no organomegaly, guarding  Ext - no edema Skin - no rashes Neuro - Does not eye open to voice, opens eyes and attempts purposeful movement to sternal rub using both UE's. Tremulous when purposeful with RUE and increased tone in that extremity. Right gaze preference    LABS:  BMET Recent Labs  Lab 06/21/2018 0720  01/02/18 0528  01/02/18 1823 01/02/18 2315  01/03/18 0614  NA 138   < > 142   < > 157* 158* 158*  157*  K 3.7  --  4.2  --   --   --  2.9*  CL 103  --  112*  --   --   --  126*  CO2 23  --  20*  --   --   --  21*  BUN 15  --  13  --   --   --  20  CREATININE 0.70  --  0.67  --   --   --  0.78  GLUCOSE 131*  --  146*  --   --   --  137*   < > = values in this interval not displayed.    Electrolytes Recent Labs  Lab 06/21/2018 0720 01/02/18 0528 01/02/18 1313 01/03/18 0614  CALCIUM 9.0 8.5*  --  8.7*  MG 1.9 1.8 1.8 2.0  PHOS 3.2 3.0 2.7 2.7    CBC Recent Labs  Lab 06/21/2018 0720 01/02/18 0528 01/03/18 0614  WBC 11.0* 12.1* 15.4*  HGB 13.5 12.5* 10.9*  HCT 39.9 38.2* 34.6*  PLT 327 285 193    Coag's Recent Labs  Lab 06/21/2018 0720  APTT 32  INR 1.03    Sepsis Markers Recent Labs  Lab 01/02/18 0528 01/02/18 0727 01/03/18 0614  LATICACIDVEN 2.7* 2.1* 2.2*  PROCALCITON <0.10  --  <0.10    ABG Recent Labs  Lab 01/03/18 0524  PHART 7.422  PCO2ART 32.8  PO2ART 141.0*    Liver Enzymes Recent Labs  Lab 01/02/2018 0720  AST 19  ALT 18  ALKPHOS 50  BILITOT 0.8  ALBUMIN 3.2*    Cardiac Enzymes No results for input(s): TROPONINI, PROBNP in the last 168 hours.  Glucose Recent Labs  Lab 01/17/2018 0506 01/02/18 1603 01/02/18 2011 01/03/18 0034 01/03/18 0414 01/03/18 0740  GLUCAP 100* 134* 70 100* 132* 102*    Imaging Dg Chest Port 1 View  Result Date: 01/03/2018 CLINICAL DATA:  Respiratory failure and stroke EXAM: PORTABLE CHEST 1 VIEW COMPARISON:  01/02/2018 FINDINGS: Tubular devices are stable. Continued bibasilar atelectasis. Upper normal heart size. No pneumothorax. Normal vascularity. Emphysema is suspected. IMPRESSION: Stable support apparatus and bibasilar atelectasis. Electronically Signed   By: Jolaine Click M.D.   On: 01/03/2018 07:40   Dg Chest Port 1 View  Result Date: 01/02/2018 CLINICAL DATA:  Central line placement. EXAM: PORTABLE CHEST 1 VIEW COMPARISON:  01/02/2018 and  prior radiographs FINDINGS: A left IJ central venous catheter is now noted with tip overlying the lower SVC. There is no evidence of pneumothorax. An endotracheal tube and NG tube entering the stomach again noted. CABG changes and continued bibasilar atelectasis again noted. IMPRESSION: Left IJ central venous catheter with tip overlying the lower SVC. No evidence of pneumothorax. Continued bibasilar atelectasis. Electronically Signed   By: Harmon Pier M.D.   On: 01/02/2018 13:15     STUDIES:  CT head 2/09 >> Rt lateral frontal hemorrhage 54 cc, edema and mass effect with 5 mm Rt to Lt shift, small volume SAH CT head 2/10 >> Increased Rt lateral frontal hemorrhage, 86 cc, 6 mm Rt to Lt shift  CULTURES: Blood 2/10 >>   ANTIBIOTICS: Zosyn 2/10 >>   SIGNIFICANT EVENTS: 2/09 Transfer from rehab to ICU 2/10 place CVL, increase 3% saline  LINES/TUBES: ETT 2/09 >>  Lt IJ CVL 2/10 >>   DISCUSSION: 72 yo with recurrent ICH with cerebral edema and midline shift complicated by compromised airway and aspiration pneumonia.  Has progression of bleeding.  Neurology recommending increasing hypertonic saline.  ASSESSMENT / PLAN:  Recurrent ICH with cerebral edema. Continues hypertonic saline, control temps, keep SBP less than 140  Hx of seizures. - place CVL for hypertonic saline - continue valproate - f/u neuro imaging per neurology  Hypertension. Hx of CAD. - goal SBP < 140  Acute respiratory failure with compromised airway. - continue vent support  Aspiration pneumonia. - day 2 of zosyn - no positive cultures  DVT prophylaxis - SCDs SUP - Protonix Nutrition - tube feeds Goals of care - full code  Updated pt's family at bedside.  They understand that his prognosis for meaningful neurologic recovery is poor, but would like aggressive care until it is clear what his ultimate mental status will be.  CC time 32 minutes  Penny Pia, MD [facility] Pulmonary/Critical Care 01/03/2018, 9:25  AM Pager:  [phone] After 3pm call: [phone]

## 2018-01-03 NOTE — Progress Notes (Signed)
STROKE TEAM PROGRESS NOTE   SUBJECTIVE (INTERVAL HISTORY) Daughters and wife are at bedside. Pt still intubated on fentanyl. Right gaze preference, not following commands. On weaning for vent, Na 158 this am. 3% saline on hold. Had spike fever yesterday. Blood culture sent   OBJECTIVE Temp:  [98.4 F (36.9 C)-101.4 F (38.6 C)] 99.1 F (37.3 C) (02/11 0800) Pulse Rate:  [63-86] 68 (02/11 0943) Cardiac Rhythm: Heart block (02/11 0800) Resp:  [13-31] 15 (02/11 0943) BP: (106-152)/(61-81) 151/73 (02/11 0943) SpO2:  [96 %-99 %] 98 % (02/11 0943) FiO2 (%):  [40 %] 40 % (02/11 0943) Weight:  [166 lb 0.1 oz (75.3 kg)] 166 lb 0.1 oz (75.3 kg) (02/11 0449)  CBC:  Recent Labs  Lab 01/02/18 0528 01/03/18 0614  WBC 12.1* 15.4*  HGB 12.5* 10.9*  HCT 38.2* 34.6*  MCV 92.9 96.6  PLT 285 193    Basic Metabolic Panel:  Recent Labs  Lab 01/02/18 0528 01/02/18 1313  01/02/18 2315 01/03/18 0614  NA 142 148*   < > 158* 158*  157*  K 4.2  --   --   --  2.9*  CL 112*  --   --   --  126*  CO2 20*  --   --   --  21*  GLUCOSE 146*  --   --   --  137*  BUN 13  --   --   --  20  CREATININE 0.67  --   --   --  0.78  CALCIUM 8.5*  --   --   --  8.7*  MG 1.8 1.8  --   --  2.0  PHOS 3.0 2.7  --   --  2.7   < > = values in this interval not displayed.    Lipid Panel:     Component Value Date/Time   CHOL 173 Jan 05, 2018 0720   TRIG 58 Jan 05, 2018 0720   TRIG 65 09/21/2006 0832   HDL 32 (L) Jan 05, 2018 0720   CHOLHDL 5.4 01/05/18 0720   VLDL 12 2018-01-05 0720   LDLCALC 129 (H) 01-05-2018 0720   HgbA1c:  Lab Results  Component Value Date   HGBA1C 5.9 (H) 09/02/2017   Urine Drug Screen:     Component Value Date/Time   LABOPIA NONE DETECTED 12/06/2017 1506   COCAINSCRNUR NONE DETECTED 12/06/2017 1506   LABBENZ NONE DETECTED 12/06/2017 1506   AMPHETMU NONE DETECTED 12/06/2017 1506   THCU NONE DETECTED 12/06/2017 1506   LABBARB NONE DETECTED 12/06/2017 1506    Alcohol Level      Component Value Date/Time   ETH <10 12/06/2017 0950    IMAGING I have personally reviewed the radiological images below and agree with the radiology interpretations.  Ct Head Wo Contrast January 05, 2018 IMPRESSION: 1. Progressive large right frontal hematoma now estimated at 80 cc volume. There is new right lateral ventricular extension and increased local subarachnoid blood. Midline shift measures 6 mm. No hydrocephalus. 2. Sequela of prior lobar hemorrhages in the bilateral parietal lobe. 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 Suspect amyloid angiopathy.  Ct Head Wo Contrast Jan 05, 2018 IMPRESSION:  1. Right lateral frontal lobe acute hemorrhage measuring up to 5.2 cm, 54 cc. Associated edema and mass effect with partial effacement of right lateral ventricle and 5 mm right-to-left midline shift. No herniation.  2. Small volume subarachnoid hemorrhage over right cerebral convexity.  3. Interval dispersion of right parietal and right temporal hematoma with residual encephalomalacia.  4. Stable chronic  left parietal infarction. Stable chronic microvascular ischemic changes and parenchymal volume loss of the brain.   Dg Chest Port 1 View 01/03/2018  IMPRESSION: Stable support apparatus and bibasilar atelectasis.     PHYSICAL EXAM Vitals:   01/03/18 0700 01/03/18 0750 01/03/18 0800 01/03/18 0943  BP: 121/62 121/63 (!) 142/67 (!) 151/73  Pulse: 63 75 67 68  Resp: 13 (!) 27 15 15   Temp:   99.1 F (37.3 C)   TempSrc:   Axillary   SpO2: 98% 97% 97% 98%  Weight:       Frail elderly caucasian male not in distress. He is intubated and sedated on fentanyl drip. Head is nontraumatic. Neck is supple without bruit.    Cardiac exam no murmur or gallop. Lungs are clear to auscultation. Distal pulses are well felt.  Neurological Exam :  Intubated and sedated. Eyes open but not following any commands. Right forced gaze. PERRL. Fundi not visualized. Not blinking to visual threat  bilaterally. Left lower facial weakness. Tongue midline inside mouth. LUE 0/5 and decreased tone. LLE 2/5 on pain stimulation. RUE 0/5 with increased tone and clonus with painful stimulation. RLE 3-/5 with pain. All deep tendon reflexes are brisker on the right than the left. Bilateral babinski positive. Sensation, coordination and gait not tested.  ASSESSMENT/PLAN [name] is a 72 y.o. male with history of previous ICH felt secondary to cerebral amyloid angiopathy, hyperlipidemia, hypertension, seizures, previous tobacco use, and coronary artery disease presenting with slurred speech. He did not receive IV t-PA due to ICH.  Right frontal lobe large acute ICH with IVH - secondary to cerebral amyloid angiopathy with hematoma expansion and increasing cytotoxic edema and right to left midline shift  Resultant  right gaze deviation and left hemiplegia   F/U CT head 01/02/18 - Progressive large right frontal hematoma now estimated at 86 cc volume  VTE prophylaxis - SCDs Diet NPO time specified Fall precautions  No antithrombotic prior to admission, now on No antithrombotic  Ongoing aggressive stroke risk factor management  Therapy recommendations:  pending  Disposition:  Pending  Respiratory failure  Intubated for airway protection  On weaning trial  CCM on board  Family would like to watch for a couple of days before decision  Cerebral edema  Right large frontal ICH with IVH  With midline shift  3% saline on hold  Na 142->158  Na goal 150-155  Repeat CT head in am  Hx of ICH due to CAA  08/2017 left parietal large ICH, and small right frontal SAH - CTA head and neck unremarkable - EF 50-55%, LDL 81 and A1C 5.9 - send to CIR   MRI 11/21/17 left occipital small subacute ICH  12/06/17 left frontal and left temporal ICH with left UE weakness  Simple partial seizure  10/23/17 - right facial twitching - partial seizure - started on keppra  11/10/17 - outpt  saw Dr. Pearlean Pierce - EEG slowing no seizure - keppra changed to depakote due to intolerance  Change depakote to 500mg  bid 12/07/2017  depakote level 30->63->61  12/09/17 - episode of left facial twitching with post ictal and todd's paralysis - vimpat load and then vimpat 100mg  bid  Now on depakote  depakote level pending in am  Fever   Tmax 101.4  On zosyn and vanco  Blood culture pending  UA pending   CXR unremarkable  Other Stroke Risk Factors  Advanced age  Former cigarette smoker - quit  ETOH use, advised to drink  no more than 1 drink per day.  Family hx stroke (mother)  Coronary artery disease  Other Active Problems  None   Hospital day # 2  This patient is critically ill and at significant risk of neurological worsening, death and care requires constant monitoring of vital signs, hemodynamics,respiratory and cardiac monitoring, extensive review of multiple databases, frequent neurological assessment, discussion with family, other specialists and medical decision making of high complexity.I have made any additions or clarifications directly to the above note. I had long discussion with wife and daughters at bedside, updated pt current condition, treatment plan and potential prognosis. They expressed understanding and appreciation. I spent 45 minutes of neurocritical care time  in the care of  this patient.   Marvel PlanJindong Samantha Ragen, MD PhD Stroke Neurology 01/03/2018 11:44 AM   To contact Stroke Continuity provider, please refer to [email or web address]. After hours, contact General Neurology

## 2018-01-03 NOTE — Progress Notes (Addendum)
Initial Nutrition Assessment  DOCUMENTATION CODES:   Not applicable  INTERVENTION:   Vital AF 1.2 @ 65 ml/hr  Provides: 1872 kcals, 117 grams protein, 1263 ml free water. Meets 100% of protein and calorie needs.   NUTRITION DIAGNOSIS:   Inadequate oral intake related to inability to eat as evidenced by NPO status.  GOAL:   Patient will meet greater than or equal to 90% of their needs  MONITOR:   Weight trends, Labs, Diet advancement, TF tolerance, Vent status, I & O's  REASON FOR ASSESSMENT:   Consult Enteral/tube feeding initiation and management  ASSESSMENT:   Pt with PMH significant for CAD s.p CABG, partial seizures, HLD, HTN, and recurrent ICH with left ICH Oct 2018 resulting in right sided weakness. [facility] from most recent right ICH (Jan 2019), developed progressive left sided weakness, and was found to have new right frontal ICH.    Pt placed on PepUp protocol. RD consulted for TF recommendations.   Spoke with wife at bedside. Reports pt had great appetite prior to and during stay at rehab. Wife denies any issues with swallowing after his second ICH. Wife unsure of any recent weight loss. Records indicate pt weighed 187 lb 10/06/17 and 166 lb this stay, showing a 11.2% wt loss in 3 months. Nutrition-Focused physical exam completed. Suspect some weight loss could be the result of muscle atrophy related to prolonged right sided weakness.   Palliative actively involved for goals of care.   Patient is currently intubated on ventilator support MV: 15.5 L/min Temp (24hrs), Avg:100.2 F (37.9 C), Min:98.4 F (36.9 C), Max:101.4 F (38.6 C) BP 138/65 MAP: 87 Propofol: None  Medications reviewed and include: 20 mEq KCl, sodium bicarbonate, fentanyl, IV abx Labs reviewed: Na 158 (H) K 2.9 (L)   NUTRITION - FOCUSED PHYSICAL EXAM:    Most Recent Value  Orbital Region  Unable to assess  Upper Arm Region  No depletion  Thoracic and  Lumbar Region  Unable to assess  Buccal Region  Unable to assess  Temple Region  Mild depletion  Clavicle Bone Region  No depletion  Clavicle and Acromion Bone Region  No depletion  Scapular Bone Region  Unable to assess  Dorsal Hand  No depletion  Patellar Region  Moderate depletion  Anterior Thigh Region  Moderate depletion  Posterior Calf Region  Moderate depletion  Edema (RD Assessment)  Mild  Hair  Reviewed  Eyes  Unable to assess  Mouth  Unable to assess  Skin  Reviewed  Nails  Reviewed       Diet Order:  Diet NPO time specified Fall precautions  EDUCATION NEEDS:   Not appropriate for education at this time  Skin:  Skin Assessment: Reviewed RN Assessment  Last BM:  12/30/17  Height:   Ht Readings from Last 1 Encounters:  12/10/17 6' (1.829 m)    Weight:   Wt Readings from Last 1 Encounters:  01/03/18 166 lb 0.1 oz (75.3 kg)    Ideal Body Weight:  80.9 kg  BMI:  Body mass index is 22.51 kg/m.  Estimated Nutritional Needs:   Kcal:  1865 kcal (PSU)  Protein:  110-120 g/day  Fluid:  >1.8 L/day    Vanessa Kickarly Emery Dupuy RD, LDN Clinical Nutrition Pager # [phone]

## 2018-01-03 NOTE — Discharge Summary (Signed)
NAME:  Jaime Pierce, Jaime Pierce               ACCOUNT NO.:  [id number]  MEDICAL RECORD NO.:  [id number]  LOCATION:                                 FACILITY:  PHYSICIAN:  Jaime OysterZachary T. Pierce, M.D.DATE OF BIRTH:  15-Jan-1946  DATE OF ADMISSION:  12/10/2017 DATE OF DISCHARGE:  01/10/2018                              DISCHARGE SUMMARY   DISCHARGE DIAGNOSES: 1. Acute right lateral frontal lobe hemorrhage with noted history of     left frontal, left parietal intracerebral hemorrhage with recurrent     cortical intracerebral hemorrhage suspect due to cerebral amyloid     angiopathy. 2. Sequential compression devices for DVT prophylaxis. 3. Pain management. 4. History of partial seizure versus transient focal neurological     episodes. 5. Hypertension. 6. Chronic obstructive pulmonary disease. 7. Spasticity. 8. Leukocytosis.  This is a 72 year old right-handed male with history of CAD with CABG, history of partial seizure, initially maintained on Keppra, tobacco abuse, hyperlipidemia, hypertension, recurrent ICH with left ICH in October 2018, right-sided residual weakness, received inpatient rehab services, discharged to home, minimal guard supervision with assistive devices, living with his spouse.  Presented on December 06, 2017, with left upper extremity tingling and weakness, systolic blood pressure 110- 120.  Cranial CT scan showed new intraparenchymal hemorrhage in the right inferior temporal lobe and right parietal lobe with some subarachnoid blood, right parietal.  Neurology followup, suspect cerebral amyloid angiopathy.  Latest cranial CT scan with evolving acute right temporal and right parietal lobe intraparenchymal hematomas, no midline shift.  EEG, no seizure.  The patient had been adjusted on seizure medications with Depakote as well as Vimpat.  Tolerating a mechanical soft diet.  Complains of left shoulder pain.  X-rays revealed mild acromioclavicular arthritis, some degenerative  changes.  Physical and occupational therapy ongoing.  The patient was admitted for comprehensive rehab program.  PAST MEDICAL HISTORY:  See discharge diagnoses.  SOCIAL HISTORY:  Lives with wife.  Using assistive device prior to admission.  FUNCTIONAL STATUS UPON ADMISSION TO REHAB SERVICES:  Max assist stand pivot transfers, moderate assist squat pivot transfers, moderate assist supine to sit, max total assist activities daily living.  PHYSICAL EXAMINATION:  VITAL SIGNS:  Blood pressure 124/72, pulse 81, temperature 98, and respirations 18. GENERAL:  This was an alert male.  Mood was flat, but appropriate.  Made good eye contact with examiner.  Speech was mildly dysarthric, but fully intelligible. HEENT:  EOMs intact. NECK:  Supple.  Nontender.  No JVD. CARDIAC:  Rate controlled. ABDOMEN:  Soft, nontender.  Good bowel sounds.  He was hyperreflexic on the right.  REHABILITATION HOSPITAL COURSE:  The patient was admitted to Inpatient Rehab Services and therapies initiated on a 3-hour daily basis, consisting of physical therapy, occupational therapy, speech therapy, and rehabilitation nursing.  The following issues were addressed during the patient's rehab stay.  Pertaining to [name]' history of left frontal, left temporal ICH, recurrent cortical ICH due to suspect cerebral amyloid angiopathy, he was progressing in therapies, noted some spasticity, adjustments made.  Had some intolerance to the use of baclofen as well as Neurontin which was use for dysesthesias.  Pain management, mild left rotator cuff syndrome,  chronic right knee pain. He was on Voltaren gel to both knees.  He did receive injection on December 14, 2017, with good improvement.  History of partial seizure versus transient focal neurological episodes, latest EEG negative. Initially on Keppra, changed to Depakote, adjusted accordingly as well as Vimpat.  Blood pressures overall controlled, no orthostasis.   The patient was receiving weekly collaborative interdisciplinary team conferences, progressing towards discharge.  He was ambulating 25 feet, moderate assist, tactile cues.  Backwards walking of 25 feet, sidestepping max assist, needing assistance for activities of daily living and homemaking, sit to stand, standing balance, functional transfers, energy conservation techniques.  Engaged in bathing and shower level dressing with sit to stand from wheelchair, transferred shower with Stedy, completed bathing tasks seated on tub bench.  He was able to wash his hair, perform sit to stand, minimal assistance.  Noted on the early morning of January 01, 2018.  Wife noted increased slurred speech, drooling, lethargy.  The patient having much more difficulty with sit to stand.  He appeared to be choking on some thin liquids.  In light of these changes, stat cranial CT scan was obtained showing right lateral frontal acute hemorrhage measuring 5.2 cm, 54 mL, associated edema, mass effect, partial effacement of right lateral ventricle, and 5- mm right-to-left midline shift, no herniation.  Neurology consulted. The patient was discharged to Intensive Care.  Followup chemistries were unremarkable, white blood cell count 11,800.  All discharge medications made as per Neurology Services.     Jaime Pierce, P.A.   ______________________________ Jaime Pierce, M.D.    DA/MEDQ  D:  01/17/2018  T:  12/26/2017  Job:  [id number]  cc:   Jaime Pierce, M.D. Jaime P. Pearlean Brownie, MD

## 2018-01-03 NOTE — Progress Notes (Signed)
Patient Na level 158. Jaime Pierce with neurology notified. Verbal order given to titrate 3% NaCl down to 5525ml/hr. Will reassess at next sodium lab draw at 0600. Patient is stable.

## 2018-01-03 NOTE — Progress Notes (Signed)
CRITICAL VALUE ALERT  Critical Value: Lactic Acid 2.2  Date & Time Notied:  01/03/18 0722  Provider Notified: AM nurse will notify CCM  Orders Received/Actions taken: Potential orders will be carried out

## 2018-01-03 NOTE — Progress Notes (Signed)
Pt has been weaning on PS 5/5 since 10:00 A.M. No signs of distress or fatigue.

## 2018-01-03 NOTE — Progress Notes (Signed)
Daily Progress Note   Patient Name: Jaime Pierce       Date: 01/03/2018 DOB: 06/28/1946  Age: 72 y.o. MRN#: [id number] Attending Physician: Micki RileySethi, Pramod S, MD Primary Care Physician: Tresa GarterPlotnikov, Aleksei V, MD Admit Date: 12/30/2017  Reason for Consultation/Follow-up: Establishing goals of care   Subjective: Patient's daughters- Hospital doctorAmber and Spring GardenAshley at bedside.  Patient continues with altered mental status, responsive minimally to noxious stimuli, minimally sedated on Fentanyl 4925mcg/hr.   Amber and Morrie Sheldonshley note family wishes to continue current care for "a few days". Gave support.   ROS  Length of Stay: 2  Current Medications: Scheduled Meds:  . atorvastatin  40 mg Per Tube q1800  . Chlorhexidine Gluconate Cloth  6 each Topical Daily  . mouth rinse  15 mL Mouth Rinse BID  . pantoprazole sodium  40 mg Per Tube Q24H  . potassium chloride  40 mEq Oral Q4H  . sodium bicarbonate  650 mg Per Tube BID  . sodium chloride flush  10-40 mL Intracatheter Q12H    Continuous Infusions: . sodium chloride 30 mL/hr at 01/03/18 1200  . feeding supplement (VITAL AF 1.2 CAL) 1,000 mL (01/03/18 1312)  . fentaNYL infusion INTRAVENOUS 25 mcg/hr (01/03/18 0800)  . niCARDipine Stopped (01/02/18 0500)  . piperacillin-tazobactam (ZOSYN)  IV 3.375 g (01/03/18 1207)  . sodium chloride (hypertonic) Stopped (01/03/18 1000)  . valproate sodium Stopped (01/02/18 1435)  . vancomycin Stopped (01/03/18 1139)    PRN Meds: [DISCONTINUED] acetaminophen **OR** acetaminophen (TYLENOL) oral liquid 160 mg/5 mL **OR** acetaminophen, fentaNYL, hydrALAZINE, ipratropium-albuterol, midazolam, sodium chloride flush  Physical Exam  Constitutional: He appears well-developed and well-nourished.  Cardiovascular: Normal rate  and regular rhythm.  Pulmonary/Chest:  intubated  Neurological:  unresponsive  Skin: Skin is warm and dry.  Nursing note and vitals reviewed.           Vital Signs: BP 138/65   Pulse (!) 53   Temp (!) 101.1 F (38.4 C)   Resp 14   Wt 75.3 kg (166 lb 0.1 oz)   SpO2 96%   BMI 22.51 kg/m  SpO2: SpO2: 96 % O2 Device: O2 Device: Ventilator O2 Flow Rate: O2 Flow Rate (L/min): 6 L/min  Intake/output summary:   Intake/Output Summary (Last 24 hours) at 01/03/2018 1431 Last data filed at 01/03/2018 1207 Gross per  24 hour  Intake 2324.42 ml  Output 1970 ml  Net 354.42 ml   LBM: Last BM Date: 12/31/17 Baseline Weight: Weight: 75.3 kg (166 lb 0.1 oz) Most recent weight: Weight: 75.3 kg (166 lb 0.1 oz)       Palliative Assessment/Data: PPS: 10%   Flowsheet Rows     Most Recent Value  Intake Tab  Referral Department  Hospitalist  Unit at Time of Referral  Med/Surg Unit  Palliative Care Primary Diagnosis  Neurology  Date Notified  01/02/18  Palliative Care Type  New Palliative care  Reason for referral  Clarify Goals of Care  Date of Admission  01/18/2018  Date first seen by Palliative Care  01/02/18  # of days Palliative referral response time  0 Day(s)  # of days IP prior to Palliative referral  1  Clinical Assessment  Psychosocial & Spiritual Assessment  Palliative Care Outcomes      Patient Active Problem List   Diagnosis Date Noted  . Cytotoxic cerebral edema (HCC) 01/02/2018  . Brain herniation (HCC) 01/02/2018  . Acute respiratory failure (HCC)   . Advance care planning   . Palliative care by specialist   . Goals of care, counseling/discussion   . Leukocytosis   . Intracerebral hemorrhage 12/10/2017  . Cerebral amyloid angiopathy (CODE) 12/09/2017  . Seizures (HCC) 12/09/2017  . ICH (intracerebral hemorrhage) (HCC) 12/06/2017  . Partial seizure (HCC) 11/08/2017  . Positive colorectal cancer screening using Cologuard test 10/22/2017  . Hemiparesis affecting  right side as late effect of cerebrovascular accident (CVA) (HCC)   . Cough   . 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 Pain   . Hyponatremia   . Benign essential HTN   . 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 Gait disturbance, post-stroke   . Aphasia as late effect of stroke   . SAH (subarachnoid hemorrhage) (HCC) 09/02/2017  . B12 deficiency 09/02/2017  . Well adult exam 06/16/2016  . Cigarette smoker 02/23/2016  . COPD GOLD II  12/24/2014  . CAD (coronary artery disease) 07/22/2011  . HTN (hypertension) 07/22/2011  . Dyslipidemia 07/22/2011  . Smoker 07/22/2011    Palliative Care Assessment & Plan   Patient Profile:  72 y.o. male  with past medical history of recent R parietal ICH from which he was recovering in rehab, cerebral amyloid angiopathy, L sided ICH in October, seizures, HTN, hyperlipidemia admitted on 12/25/2017 with new frontal ICH with midline shift that presented as slurred speech, choking on some water, altered mental status. Repeat CT on 2/10 showed increased blood volume and increasing L to R shift an Respiratory status decompensated requiring intubation. Concern for aspiration pneumonia.    Assessment/Recommendations/Plan   Continue current level of care  Family has set limits of no trach/no PEG/no SNF  PMT will continue to follow and address GOC  Goals of Care and Additional Recommendations:  Limitations on Scope of Treatment: No Tracheostomy  Code Status:  DNR  Prognosis:   Unable to determine  Discharge Planning:  To Be Determined  Care plan was discussed with pt daughters.  Thank you for allowing the Palliative Medicine Team to assist in the care of this patient.   Time In: 1415 Time Out: 1425 Total Time 20 mins Prolonged Time Billed  no       Greater than 50%  of this time was spent counseling and coordinating care related to the above assessment and  plan.  Mariana Kaufman, AGNP-C Palliative Medicine   Please contact Palliative Medicine Team phone at [phone] for questions and concerns.

## 2018-01-04 ENCOUNTER — Ambulatory Visit: Payer: Medicare HMO | Admitting: Physical Therapy

## 2018-01-04 ENCOUNTER — Encounter: Payer: Medicare HMO | Admitting: Occupational Therapy

## 2018-01-04 ENCOUNTER — Encounter (HOSPITAL_COMMUNITY): Payer: Self-pay

## 2018-01-04 ENCOUNTER — Other Ambulatory Visit: Payer: Self-pay

## 2018-01-04 ENCOUNTER — Inpatient Hospital Stay (HOSPITAL_COMMUNITY): Payer: Medicare HMO

## 2018-01-04 DIAGNOSIS — R569 Unspecified convulsions: Secondary | ICD-10-CM

## 2018-01-04 LAB — COMPREHENSIVE METABOLIC PANEL
ALT: 13 U/L — ABNORMAL LOW (ref 17–63)
AST: 17 U/L (ref 15–41)
Albumin: 2.3 g/dL — ABNORMAL LOW (ref 3.5–5.0)
Alkaline Phosphatase: 34 U/L — ABNORMAL LOW (ref 38–126)
Anion gap: 9 (ref 5–15)
BUN: 27 mg/dL — AB (ref 6–20)
CHLORIDE: 124 mmol/L — AB (ref 101–111)
CO2: 24 mmol/L (ref 22–32)
Calcium: 8.5 mg/dL — ABNORMAL LOW (ref 8.9–10.3)
Creatinine, Ser: 0.76 mg/dL (ref 0.61–1.24)
Glucose, Bld: 159 mg/dL — ABNORMAL HIGH (ref 65–99)
POTASSIUM: 3.1 mmol/L — AB (ref 3.5–5.1)
Sodium: 157 mmol/L — ABNORMAL HIGH (ref 135–145)
Total Bilirubin: 0.6 mg/dL (ref 0.3–1.2)
Total Protein: 5.2 g/dL — ABNORMAL LOW (ref 6.5–8.1)

## 2018-01-04 LAB — CBC WITH DIFFERENTIAL/PLATELET
BASOS ABS: 0.1 10*3/uL (ref 0.0–0.1)
BASOS PCT: 0 %
EOS ABS: 0 10*3/uL (ref 0.0–0.7)
Eosinophils Relative: 0 %
HCT: 32.5 % — ABNORMAL LOW (ref 39.0–52.0)
HEMOGLOBIN: 10.1 g/dL — AB (ref 13.0–17.0)
Lymphocytes Relative: 10 %
Lymphs Abs: 1.4 10*3/uL (ref 0.7–4.0)
MCH: 29.9 pg (ref 26.0–34.0)
MCHC: 31.1 g/dL (ref 30.0–36.0)
MCV: 96.2 fL (ref 78.0–100.0)
Monocytes Absolute: 1.7 10*3/uL — ABNORMAL HIGH (ref 0.1–1.0)
Monocytes Relative: 12 %
NEUTROS PCT: 78 %
Neutro Abs: 11 10*3/uL — ABNORMAL HIGH (ref 1.7–7.7)
Platelets: 156 10*3/uL (ref 150–400)
RBC: 3.38 MIL/uL — AB (ref 4.22–5.81)
RDW: 15.9 % — ABNORMAL HIGH (ref 11.5–15.5)
WBC: 14.1 10*3/uL — AB (ref 4.0–10.5)

## 2018-01-04 LAB — BLOOD GAS, ARTERIAL
ACID-BASE EXCESS: 0.5 mmol/L (ref 0.0–2.0)
Bicarbonate: 24 mmol/L (ref 20.0–28.0)
DRAWN BY: 51191
FIO2: 40
MECHVT: 550 mL
O2 Saturation: 96.5 %
PEEP/CPAP: 5 cmH2O
PO2 ART: 81 mmHg — AB (ref 83.0–108.0)
Patient temperature: 98.6
RATE: 14 resp/min
pCO2 arterial: 35 mmHg (ref 32.0–48.0)
pH, Arterial: 7.45 (ref 7.350–7.450)

## 2018-01-04 LAB — PROCALCITONIN: Procalcitonin: <0.1 ng/mL

## 2018-01-04 LAB — MAGNESIUM: Magnesium: 1.8 mg/dL (ref 1.7–2.4)

## 2018-01-04 LAB — GLUCOSE, CAPILLARY
GLUCOSE-CAPILLARY: 134 mg/dL — AB (ref 65–99)
GLUCOSE-CAPILLARY: 129 mg/dL — AB (ref 65–99)
GLUCOSE-CAPILLARY: 100 mg/dL — AB (ref 65–99)
GLUCOSE-CAPILLARY: 115 mg/dL — AB (ref 65–99)
GLUCOSE-CAPILLARY: 100 mg/dL — AB (ref 65–99)
Glucose-Capillary: 120 mg/dL — ABNORMAL HIGH (ref 65–99)

## 2018-01-04 LAB — VALPROIC ACID LEVEL: VALPROIC ACID LVL: 48 ug/mL — AB (ref 50.0–100.0)

## 2018-01-04 LAB — SODIUM
Sodium: 157 mmol/L — ABNORMAL HIGH (ref 135–145)
Sodium: 157 mmol/L — ABNORMAL HIGH (ref 135–145)
Sodium: 158 mmol/L — ABNORMAL HIGH (ref 135–145)

## 2018-01-04 LAB — PROTIME-INR
INR: 1.34
Prothrombin Time: 16.5 seconds — ABNORMAL HIGH (ref 11.4–15.2)

## 2018-01-04 MED ORDER — POTASSIUM CHLORIDE 20 MEQ PO PACK
20.0000 meq | PACK | Freq: Two times a day (BID) | ORAL | Status: AC
  Administered 2018-01-04 (×2): 20 meq
  Filled 2018-01-04 (×2): qty 1

## 2018-01-04 NOTE — Progress Notes (Signed)
Family alerted nurse again of more RUE and R shoulder movements that they believe are seizure activity. Family again pressed the EEG record button. Family stated the movements lasted less than 5 min again, this time with RR increasing again to the upper 20's low 30's. It was pointed out the difference this time was that pt HR gradually increased up to around 156 and had reduced back to 70's by the time nursing was able to enter the room to assess the pt. Pt presenting without any abnormal movements or distress at this time. Will continue to monitor and administer medication as ordered.

## 2018-01-04 NOTE — Progress Notes (Signed)
LTM EEG checked, all imp below 5K Ohms, no skin breakdown noted.

## 2018-01-04 NOTE — Procedures (Addendum)
  Video EEG Monitoring Report    Dates of monitoring:   01/03/17 @21 :10 to 01/04/17 @07 :30 Recording day:    1 (started on 2/11) EEG Number:    [id number] Requesting provider:   Caryl PinaEric Lindzen, MD Interpreting physician:  Wynelle Bourgeoisan-Andrei Amyrah Pinkhasov, MD  CPT:  [id number] ICD-10:  R56.9   Present History: 72 year old man with intracranial hemorrhage and recurrent seizures. Continuous EEG monitoring is requested to evaluate for seizures.    EEG Classification  Abnormal, Stupor <-> Awake  PDR  7 Hz, seen occasionally on the left  HR  70 bpm and regular     Background abnormalities:  1. Asymmetry, decreased amplitude, right hemisphere  2. Continuous slow, right>left hemisphere, generalized     Periodic, rhythmic or epileptiform abnormalities:  none   Ictal phenomena:  none   EEG DETAILS:  TYPE OF RECORDING: At least 18-channel digital EEG with using a standard international 10-20 placement with additional EEG electrodes, and 1 additional channel dedicated to the EKG, at a sampling rate of at least 256 Hz. Video was recorded throughout the study. The recording was interpreted using digital review software allowing for montage reformatting, gain and filter changes. Each page was reviewed manually. Automatic spike and seizure detection software and quantitative analysis tools were used as needed.   Description of EEG features: The recording reveals a continuous, variable and reactive asymmetricbackground.  On the right, there is a persistent decreased amplitude of faster activities, including theta and alpha ranges. There is continuous, waxing and waning, diffuse 2-3 Hz delta dominant background with intermittent faster delta rhythms.   On the left, better formed, better organized activity is seen, with a dominant diffuse 6-7 Hz for the most part.   Spontaneously and with stimulation, however, arousals are seen and a posterior dominant rhythm emerges on the left, reaching 7-8 Hz. The right  hemisphere continues to be asymmetrically slower, and no well defined PDR is seen even during maximal arousals.   Push Button Events: 7 events marked on 02/12 @00 :37, 01:25, 01:29, 01:38, 01:50, 02:54, 06:31   Clinical: there is no obvious clinical semiology on video, with some of the events the patient coughs and/or bucks the ventilator  EEG: no ictal pattern  Interpretation: This EEG is indicative of a focal area of structural abnormality and cortical dysfunction in the right hemisphere. Additionally, there is a moderate diffuse encephalopathy, judging by the best rhythms achieved by the left hemisphere. Sedating medication effect cannot be excluded.   No epileptiform discharges or seizures are seen.   Seven events are marked on the recording, but no clinical semiology is apparent on video, and there is no EEG seizure pattern associated with these events.

## 2018-01-04 NOTE — Progress Notes (Signed)
STROKE TEAM PROGRESS NOTE   SUBJECTIVE (INTERVAL HISTORY) Daughters and wife are at bedside. Pt had one episode of partial seizure on the right upper extremity last night, was given Depakote load and Vimpat resumed.  Put on long-term EEG,   Which showed no seizure-like activity.  Patient still intubated, less active as yesterday.  Depakote level 48 this morning.  Sodium 157.   OBJECTIVE Temp:  [98.5 F (36.9 C)-100.5 F (38.1 C)] 99.9 F (37.7 C) (02/12 2000) Pulse Rate:  [61-90] 71 (02/12 2000) Cardiac Rhythm: Heart block (02/12 1600) Resp:  [12-34] 17 (02/12 2000) BP: (113-147)/(55-96) 147/67 (02/12 2000) SpO2:  [93 %-100 %] 98 % (02/12 2000) FiO2 (%):  [40 %] 40 % (02/12 1951) Weight:  [173 lb 11.6 oz (78.8 kg)] 173 lb 11.6 oz (78.8 kg) (02/12 1702)  CBC:  Recent Labs  Lab 01/03/18 0614 01/04/18 0447  WBC 15.4* 14.1*  NEUTROABS  --  11.0*  HGB 10.9* 10.1*  HCT 34.6* 32.5*  MCV 96.6 96.2  PLT 193 156    Basic Metabolic Panel:  Recent Labs  Lab 01/03/18 0614  01/03/18 1753  01/04/18 0447 01/04/18 1147 01/04/18 1814  NA 158*  157*   < > 159*   < > 157* 157* 157*  K 2.9*  --   --   --  3.1*  --   --   CL 126*  --   --   --  124*  --   --   CO2 21*  --   --   --  24  --   --   GLUCOSE 137*  --   --   --  159*  --   --   BUN 20  --   --   --  27*  --   --   CREATININE 0.78  --   --   --  0.76  --   --   CALCIUM 8.7*  --   --   --  8.5*  --   --   MG 2.0  --  2.1  --  1.8  --   --   PHOS 2.7  --  1.9*  --   --   --   --    < > = values in this interval not displayed.    Lipid Panel:     Component Value Date/Time   CHOL 173 01/11/2018 0720   TRIG 58 01/02/2018 0720   TRIG 65 09/21/2006 0832   HDL 32 (L) 01/19/2018 0720   CHOLHDL 5.4 01/15/2018 0720   VLDL 12 12/30/2017 0720   LDLCALC 129 (H) 01/15/2018 0720   HgbA1c:  Lab Results  Component Value Date   HGBA1C 5.9 (H) 09/02/2017   Urine Drug Screen:     Component Value Date/Time   LABOPIA NONE  DETECTED 12/06/2017 1506   COCAINSCRNUR NONE DETECTED 12/06/2017 1506   LABBENZ NONE DETECTED 12/06/2017 1506   AMPHETMU NONE DETECTED 12/06/2017 1506   THCU NONE DETECTED 12/06/2017 1506   LABBARB NONE DETECTED 12/06/2017 1506    Alcohol Level     Component Value Date/Time   ETH <10 12/06/2017 0950    IMAGING I have personally reviewed the radiological images below and agree with the radiology interpretations.  Ct Head Wo Contrast 01/04/2018 IMPRESSION: 1. Progressive large right frontal hematoma now estimated at 80 cc volume. There is new right lateral ventricular extension and increased local subarachnoid blood. Midline shift measures 6 mm. No hydrocephalus. 2.  Sequela of prior lobar hemorrhages in the bilateral parietal lobe. 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 Suspect amyloid angiopathy.  Ct Head Wo Contrast 01/12/2018 IMPRESSION:  1. Right lateral frontal lobe acute hemorrhage measuring up to 5.2 cm, 54 cc. Associated edema and mass effect with partial effacement of right lateral ventricle and 5 mm right-to-left midline shift. No herniation.  2. Small volume subarachnoid hemorrhage over right cerebral convexity.  3. Interval dispersion of right parietal and right temporal hematoma with residual encephalomalacia.  4. Stable chronic left parietal infarction. Stable chronic microvascular ischemic changes and parenchymal volume loss of the brain.   Dg Chest Port 1 View 01/03/2018  IMPRESSION: Stable support apparatus and bibasilar atelectasis.   LTM EEG 01/04/18 This EEG is indicative of a focal area of structural abnormality and cortical dysfunction in the right hemisphere. Additionally, there is a moderate diffuse encephalopathy, judging by the best rhythms achieved by the left hemisphere. Sedating medication effect cannot be excluded.  No epileptiform discharges or seizures are seen.  Seven events are marked on the recording, but no clinical semiology is apparent on  video, and there is no EEG seizure pattern associated with these events.   Repeat CT pending   PHYSICAL EXAM Vitals:   01/04/18 1702 01/04/18 1800 01/04/18 1951 01/04/18 2000  BP: 129/61 134/64 (!) 147/67 (!) 147/67  Pulse: 90 66 71 71  Resp: 15 16 (!) 23 17  Temp: 99.4 F (37.4 C)   99.9 F (37.7 C)  TempSrc: Axillary   Axillary  SpO2:  98% 96% 98%  Weight: 173 lb 11.6 oz (78.8 kg)     Height: 6' (1.829 m)      Frail elderly caucasian male not in distress. He is intubated and sedated on fentanyl drip. Head is nontraumatic. Neck is supple without bruit.    Cardiac exam no murmur or gallop. Lungs are clear to auscultation. Distal pulses are well felt.  Neurological Exam :  Intubated and sedated. Eyes open but not following any commands. Right forced gaze. PERRL. Fundi not visualized. Not blinking to visual threat bilaterally. Left lower facial weakness. Tongue midline inside mouth. LUE 0/5 and decreased tone. LLE 2/5 on pain stimulation. RUE 0/5 with increased tone and clonus with painful stimulation. RLE 3-/5 with pain. All deep tendon reflexes are brisker on the right than the left. Bilateral babinski positive. Sensation, coordination and gait not tested.  ASSESSMENT/PLAN [name] is a 72 y.o. male with history of previous ICH felt secondary to cerebral amyloid angiopathy, hyperlipidemia, hypertension, seizures, previous tobacco use, and coronary artery disease presenting with slurred speech. He did not receive IV t-PA due to ICH.  Right frontal lobe large acute ICH with IVH - secondary to cerebral amyloid angiopathy with hematoma expansion and increasing cytotoxic edema and right to left midline shift  Resultant  right gaze deviation and left hemiplegia   F/U CT head 01/02/18 - Progressive large right frontal hematoma now estimated at 86 cc volume  VTE prophylaxis - SCDs Diet NPO time specified Fall precautions  No antithrombotic prior to admission, now on No  antithrombotic  Ongoing aggressive stroke risk factor management  Therapy recommendations:  pending  Disposition:  Pending  Respiratory failure  Intubated for airway protection  CCM on board  Taper off fentanyl as able  Cerebral edema  Right large frontal ICH with IVH  With midline shift  3% saline on hold  On NS @ 30cc  Na 142->158->157  Na goal 150-155  Repeat CT head in am  Hx of ICH due to CAA  08/2017 left parietal large ICH, and small right frontal SAH - CTA head and neck unremarkable - EF 50-55%, LDL 81 and A1C 5.9 - send to CIR   MRI 11/21/17 left occipital small subacute ICH  12/06/17 left frontal and left temporal ICH with left UE weakness  Simple partial seizure  10/23/17 - right facial twitching - partial seizure - started on keppra  11/10/17 - outpt saw Dr. Pearlean BrownieSethi - EEG slowing no seizure - keppra changed to depakote due to intolerance  Change depakote to 500mg  bid 12/07/2017  depakote level 30->63->61  12/09/17 - episode of left facial twitching with post ictal and todd's paralysis - vimpat load and then vimpat 100mg  bid  01/03/18 - RUE partial rhythmic movement - loaded with depakote and resume vimpat  LTM EEG no seizure but slow  depakote level 48->pending in am  Fever   Tmax 101.4  On zosyn  Blood culture NGTD  UA WBC 6-30  CXR unremarkable  Other Stroke Risk Factors  Advanced age  Former cigarette smoker - quit  ETOH use, advised to drink no more than 1 drink per day.  Family hx stroke (mother)  Coronary artery disease  Other Active Problems  None   Hospital day # 3  This patient is critically ill and at significant risk of neurological worsening, death and care requires constant monitoring of vital signs, hemodynamics,respiratory and cardiac monitoring, extensive review of multiple databases, frequent neurological assessment, discussion with family, other specialists and medical decision making of high complexity.I  have made any additions or clarifications directly to the above note. I had long discussion with wife and daughters at bedside, updated pt current condition, treatment plan and potential prognosis. They expressed understanding and appreciation. I spent 45 minutes of neurocritical care time  in the care of  this patient.   Marvel PlanJindong Arnie Maiolo, MD PhD Stroke Neurology 01/04/2018 10:36 PM   To contact Stroke Continuity provider, please refer to [email or web address]. After hours, contact General Neurology

## 2018-01-04 NOTE — Progress Notes (Signed)
Family called nurse into room, able to witness pt with  twitching movements localized to RUE lasting no more than 5 minutes. Pt RR did increase to the upper 20's and low 30's during this time but soon subsided. Pt was given 1 mg Versed per PRN orders. Pt remains on continuous EEG, family did press the record button when activity started. Will continue to monitor.

## 2018-01-04 NOTE — Plan of Care (Signed)
Pt tolerating TF at current rate.

## 2018-01-04 NOTE — Plan of Care (Signed)
Pt is not responsive currently.  Will continue to educate family on plan of care.  Jaime PurpuraSusan Krystyna Cleckley RN

## 2018-01-04 NOTE — Progress Notes (Signed)
PULMONARY / CRITICAL CARE MEDICINE   Name: Jaime FuelRichard A Pierce MRN: [id number] DOB: May 24, 1946    ADMISSION DATE:  01/08/2018 CONSULTATION DATE:  01/04/2018  REFERRING MD:  Dr. Pearlean BrownieSethi, Neurology  CHIEF COMPLAINT:  Altered mental status.  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  He developed progressive Lt sided weakness and unable to speak.  He was found to have new Rt frontal ICH with 5 mm shift.  He required intubation for airway protection. PMHx of HTN, HLD, CAD s/p CABG.  SUBJECTIVE:  He is currently receiving 3% saline. Focal seizure last night. Sedated on 25 mcg Fentanyl.  Intubated and mechanically ventilated. On no antihypertensive  VITAL SIGNS: BP (!) 119/55   Pulse 73   Temp (!) 100.5 F (38.1 C) (Axillary)   Resp (!) 22   Wt 173 lb 11.6 oz (78.8 kg)   SpO2 100%   BMI 23.56 kg/m   VENTILATOR SETTINGS: Vent Mode: PRVC FiO2 (%):  [40 %] 40 % Set Rate:  [14 bmp] 14 bmp Vt Set:  [550 mL] 550 mL PEEP:  [5 cmH20] 5 cmH20 Pressure Support:  [5 cmH20] 5 cmH20 Plateau Pressure:  [16 cmH20] 16 cmH20  INTAKE / OUTPUT: I/O last 3 completed shifts: In: 3779.2 [I.V.:1172.2; NG/GT:1917; IV Piggyback:690] Out: 2775 [Urine:2775]  PHYSICAL EXAMINATION:  General - orally intubated and mechanically ventilated. Cardiac - median sternotomy scar. PMI unremarkable. RRR, no m g or rub Chest - unlabored, symmetric air movement, no wheezes Abd - soft, non tender, no organomegaly, guarding  Ext - no edema Skin - no rashes Neuro - Does not eye open to voice or sternal rub. No limb movement, tonic right eye deviation.    LABS:  BMET Recent Labs  Lab 01/02/18 0528  01/03/18 0614  01/03/18 1753 01/04/18 0005 01/04/18 0447  NA 142   < > 158*  157*   < > 159* 158* 157*  K 4.2  --  2.9*  --   --   --  3.1*  CL 112*  --  126*  --   --   --   124*  CO2 20*  --  21*  --   --   --  24  BUN 13  --  20  --   --   --  27*  CREATININE 0.67  --  0.78  --   --   --  0.76  GLUCOSE 146*  --  137*  --   --   --  159*   < > = values in this interval not displayed.    Electrolytes Recent Labs  Lab 01/02/18 0528 01/02/18 1313 01/03/18 0614 01/03/18 1753 01/04/18 0447  CALCIUM 8.5*  --  8.7*  --  8.5*  MG 1.8 1.8 2.0 2.1 1.8  PHOS 3.0 2.7 2.7 1.9*  --     CBC Recent Labs  Lab 01/02/18 0528 01/03/18 0614 01/04/18 0447  WBC 12.1* 15.4* 14.1*  HGB 12.5* 10.9* 10.1*  HCT 38.2* 34.6* 32.5*  PLT 285 193 156    Coag's Recent Labs  Lab 12/30/2017 0720 01/04/18 0447  APTT 32  --   INR 1.03 1.34    Sepsis Markers Recent Labs  Lab 01/02/18 0528 01/02/18 0727 01/03/18 0614 01/03/18 2049 01/04/18 0447  LATICACIDVEN 2.7* 2.1* 2.2* 2.1*  --   PROCALCITON <0.10  --  <  0.10  --  <0.10    ABG Recent Labs  Lab 01/02/18 2131 01/03/18 0524 01/04/18 0355  PHART 7.353 7.422 7.450  PCO2ART 35.5 32.8 35.0  PO2ART 79.0* 141.0* 81.0*    Liver Enzymes Recent Labs  Lab 01/06/2018 0720 01/04/18 0447  AST 19 17  ALT 18 13*  ALKPHOS 50 34*  BILITOT 0.8 0.6  ALBUMIN 3.2* 2.3*    Cardiac Enzymes No results for input(s): TROPONINI, PROBNP in the last 168 hours.  Glucose Recent Labs  Lab 01/03/18 1134 01/03/18 1618 01/03/18 1929 01/03/18 2330 01/04/18 0314 01/04/18 0818  GLUCAP 132* 130* 144* 108* 134* 129*    Imaging No results found.   STUDIES:  CT head 2/09 >> Rt lateral frontal hemorrhage 54 cc, edema and mass effect with 5 mm Rt to Lt shift, small volume SAH CT head 2/10 >> Increased Rt lateral frontal hemorrhage, 86 cc, 6 mm Rt to Lt shift  CULTURES: Blood 2/10 >>   ANTIBIOTICS: Zosyn 2/10 >>   SIGNIFICANT EVENTS: 2/09 Transfer from rehab to ICU 2/10 place CVL, increase 3% saline  LINES/TUBES: ETT 2/09 >>  Lt IJ CVL 2/10 >>   DISCUSSION: 72 yo with recurrent ICH with cerebral edema and  midline shift complicated by compromised airway and aspiration pneumonia.  Has progression of bleeding.  Neurology recommending increasing hypertonic saline.  ASSESSMENT / PLAN:  Recurrent ICH with cerebral edema. Continues hypertonic saline, control temps, keep SBP less than 140  Hx of seizures. - place CVL for hypertonic saline - AED's adjusted by neurology. Hypertension. Hx of CAD. - goal SBP < 140  Acute respiratory failure with compromised airway. - no plan to wean today.  Suspected aspiration pneumonia. Mixed flora on gram stain. No staph in culture. D/C Vanco - day 2 of zosyn   DVT prophylaxis - SCDs SUP - Protonix Nutrition - tube feeds Goals of care - full code  Updated pt's family at bedside.  CC time 32 minutes  Penny Pia, MD [facility] Pulmonary/Critical Care 01/04/2018, 9:01 AM Pager:  [phone] After 3pm call: [phone]

## 2018-01-05 ENCOUNTER — Inpatient Hospital Stay (HOSPITAL_COMMUNITY): Payer: Medicare HMO

## 2018-01-05 ENCOUNTER — Ambulatory Visit: Payer: Medicare HMO | Admitting: Physical Therapy

## 2018-01-05 ENCOUNTER — Encounter: Payer: Medicare HMO | Admitting: Occupational Therapy

## 2018-01-05 LAB — BASIC METABOLIC PANEL
Anion gap: 12 (ref 5–15)
BUN: 27 mg/dL — AB (ref 6–20)
CO2: 24 mmol/L (ref 22–32)
Calcium: 8.3 mg/dL — ABNORMAL LOW (ref 8.9–10.3)
Chloride: 120 mmol/L — ABNORMAL HIGH (ref 101–111)
Creatinine, Ser: 0.75 mg/dL (ref 0.61–1.24)
GFR calc Af Amer: >60 mL/min (ref >60)
GFR calc non Af Amer: >60 mL/min (ref >60)
GLUCOSE: 155 mg/dL — AB (ref 65–99)
POTASSIUM: 3.2 mmol/L — AB (ref 3.5–5.1)
Sodium: 156 mmol/L — ABNORMAL HIGH (ref 135–145)

## 2018-01-05 LAB — POCT I-STAT 3, ART BLOOD GAS (G3+)
Acid-Base Excess: 2 mmol/L (ref 0.0–2.0)
Bicarbonate: 24.9 mmol/L (ref 20.0–28.0)
O2 Saturation: 96 %
PCO2 ART: 32.8 mmHg (ref 32.0–48.0)
Patient temperature: 98
TCO2: 26 mmol/L (ref 22–32)
pH, Arterial: 7.487 — ABNORMAL HIGH (ref 7.350–7.450)
pO2, Arterial: 71 mmHg — ABNORMAL LOW (ref 83.0–108.0)

## 2018-01-05 LAB — CULTURE, RESPIRATORY W GRAM STAIN: Culture: NORMAL

## 2018-01-05 LAB — CBC WITH DIFFERENTIAL/PLATELET
Basophils Absolute: 0 10*3/uL (ref 0.0–0.1)
Basophils Relative: 0 %
EOS PCT: 0 %
Eosinophils Absolute: 0 10*3/uL (ref 0.0–0.7)
HCT: 34.3 % — ABNORMAL LOW (ref 39.0–52.0)
Hemoglobin: 10.7 g/dL — ABNORMAL LOW (ref 13.0–17.0)
LYMPHS ABS: 1.8 10*3/uL (ref 0.7–4.0)
LYMPHS PCT: 14 %
MCH: 29.7 pg (ref 26.0–34.0)
MCHC: 31.2 g/dL (ref 30.0–36.0)
MCV: 95.3 fL (ref 78.0–100.0)
Monocytes Absolute: 1.6 10*3/uL — ABNORMAL HIGH (ref 0.1–1.0)
Monocytes Relative: 12 %
Neutro Abs: 9.8 10*3/uL — ABNORMAL HIGH (ref 1.7–7.7)
Neutrophils Relative %: 74 %
PLATELETS: 155 10*3/uL (ref 150–400)
RBC: 3.6 MIL/uL — AB (ref 4.22–5.81)
RDW: 15.7 % — ABNORMAL HIGH (ref 11.5–15.5)
WBC: 13.2 10*3/uL — AB (ref 4.0–10.5)

## 2018-01-05 LAB — CULTURE, RESPIRATORY

## 2018-01-05 LAB — GLUCOSE, CAPILLARY
GLUCOSE-CAPILLARY: 117 mg/dL — AB (ref 65–99)
GLUCOSE-CAPILLARY: 122 mg/dL — AB (ref 65–99)
GLUCOSE-CAPILLARY: 127 mg/dL — AB (ref 65–99)
GLUCOSE-CAPILLARY: 106 mg/dL — AB (ref 65–99)
Glucose-Capillary: 125 mg/dL — ABNORMAL HIGH (ref 65–99)
Glucose-Capillary: 99 mg/dL (ref 65–99)

## 2018-01-05 LAB — VALPROIC ACID LEVEL: VALPROIC ACID LVL: 46 ug/mL — AB (ref 50.0–100.0)

## 2018-01-05 LAB — PROCALCITONIN: Procalcitonin: 0.26 ng/mL

## 2018-01-05 LAB — SODIUM
Sodium: 157 mmol/L — ABNORMAL HIGH (ref 135–145)
Sodium: 157 mmol/L — ABNORMAL HIGH (ref 135–145)
Sodium: 157 mmol/L — ABNORMAL HIGH (ref 135–145)
Sodium: 153 mmol/L — ABNORMAL HIGH (ref 135–145)

## 2018-01-05 LAB — MAGNESIUM: Magnesium: 2 mg/dL (ref 1.7–2.4)

## 2018-01-05 MED ORDER — POTASSIUM CHLORIDE 20 MEQ PO PACK
20.0000 meq | PACK | Freq: Two times a day (BID) | ORAL | Status: AC
  Administered 2018-01-05 (×2): 20 meq
  Filled 2018-01-05 (×2): qty 1

## 2018-01-05 MED ORDER — CHLORHEXIDINE GLUCONATE 0.12 % MT SOLN
OROMUCOSAL | Status: AC
  Filled 2018-01-05: qty 15

## 2018-01-05 NOTE — Progress Notes (Signed)
STROKE TEAM PROGRESS NOTE   SUBJECTIVE (INTERVAL HISTORY) Daughters and wife are at bedside. Pt was on LTM EEG overnight and 3 events triggered button push, however, EEG report no seizure seen on those events. Will take off EEG and repeat CT head. Sodium 156. Still intubated on vent. Had fentanyl overnight, now off. depakote level still subtherapeutic.    OBJECTIVE Temp:  [97.7 F (36.5 C)-100.7 F (38.2 C)] 97.7 F (36.5 C) (02/13 0744) Pulse Rate:  [65-90] 76 (02/13 1143) Cardiac Rhythm: Heart block (02/13 0800) Resp:  [15-34] 32 (02/13 1143) BP: (127-161)/(61-89) 142/76 (02/13 1143) SpO2:  [93 %-100 %] 97 % (02/13 1143) FiO2 (%):  [40 %] 40 % (02/13 1143) Weight:  [173 lb 11.6 oz (78.8 kg)-177 lb 11.1 oz (80.6 kg)] 177 lb 11.1 oz (80.6 kg) (02/13 0459)  CBC:  Recent Labs  Lab 01/04/18 0447 01/05/18 0438  WBC 14.1* 13.2*  NEUTROABS 11.0* 9.8*  HGB 10.1* 10.7*  HCT 32.5* 34.3*  MCV 96.2 95.3  PLT 156 155    Basic Metabolic Panel:  Recent Labs  Lab 01/03/18 0614  01/03/18 1753  01/04/18 0447  01/05/18 0232 01/05/18 0438  NA 158*  157*   < > 159*   < > 157*   < > 157* 156*  K 2.9*  --   --   --  3.1*  --   --  3.2*  CL 126*  --   --   --  124*  --   --  120*  CO2 21*  --   --   --  24  --   --  24  GLUCOSE 137*  --   --   --  159*  --   --  155*  BUN 20  --   --   --  27*  --   --  27*  CREATININE 0.78  --   --   --  0.76  --   --  0.75  CALCIUM 8.7*  --   --   --  8.5*  --   --  8.3*  MG 2.0  --  2.1  --  1.8  --   --  2.0  PHOS 2.7  --  1.9*  --   --   --   --   --    < > = values in this interval not displayed.    Lipid Panel:     Component Value Date/Time   CHOL 173 12/30/2017 0720   TRIG 58 01/08/2018 0720   TRIG 65 09/21/2006 0832   HDL 32 (L) 01/16/2018 0720   CHOLHDL 5.4 01/19/2018 0720   VLDL 12 01/19/2018 0720   LDLCALC 129 (H) 01/05/2018 0720   HgbA1c:  Lab Results  Component Value Date   HGBA1C 5.9 (H) 09/02/2017   Urine Drug Screen:     Component Value Date/Time   LABOPIA NONE DETECTED 12/06/2017 1506   COCAINSCRNUR NONE DETECTED 12/06/2017 1506   LABBENZ NONE DETECTED 12/06/2017 1506   AMPHETMU NONE DETECTED 12/06/2017 1506   THCU NONE DETECTED 12/06/2017 1506   LABBARB NONE DETECTED 12/06/2017 1506    Alcohol Level     Component Value Date/Time   ETH <10 12/06/2017 0950    IMAGING I have personally reviewed the radiological images below and agree with the radiology interpretations.  Ct Head Wo Contrast 01/19/2018 IMPRESSION: 1. Progressive large right frontal hematoma now estimated at 80 cc volume. There is new right lateral ventricular extension  and increased local subarachnoid blood. Midline shift measures 6 mm. No hydrocephalus. 2. Sequela of prior lobar hemorrhages in the bilateral parietal lobe. 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 Suspect amyloid angiopathy.  Ct Head Wo Contrast January 08, 2018 IMPRESSION:  1. Right lateral frontal lobe acute hemorrhage measuring up to 5.2 cm, 54 cc. Associated edema and mass effect with partial effacement of right lateral ventricle and 5 mm right-to-left midline shift. No herniation.  2. Small volume subarachnoid hemorrhage over right cerebral convexity.  3. Interval dispersion of right parietal and right temporal hematoma with residual encephalomalacia.  4. Stable chronic left parietal infarction. Stable chronic microvascular ischemic changes and parenchymal volume loss of the brain.   Dg Chest Port 1 View 01/03/2018  IMPRESSION: Stable support apparatus and bibasilar atelectasis.   LTM EEG 01/04/18 This EEG is indicative of a focal area of structural abnormality and cortical dysfunction in the right hemisphere. Additionally, there is a moderate diffuse encephalopathy, judging by the best rhythms achieved by the left hemisphere. Sedating medication effect cannot be excluded.  No epileptiform discharges or seizures are seen.  Seven events are marked on the  recording, but no clinical semiology is apparent on video, and there is no EEG seizure pattern associated with these events.   LTM EEG 01/05/18 This EEG is indicative of a focal area of structural abnormality and cortical dysfunction in the right hemisphere. Additionally, there is a moderate diffuse encephalopathy, judging by the best rhythms achieved by the left hemisphere. Sedating medication effect cannot be excluded.  No epileptiform discharges or seizures are seen.  Three events are marked on the recording, but no clinical semiology is apparent on video. They all occur during more awake EEG segments, but there is no EEG seizure pattern associated with these events.   Repeat CT pending   PHYSICAL EXAM Vitals:   01/05/18 0800 01/05/18 0900 01/05/18 1000 01/05/18 1143  BP: (!) 146/70 (!) 161/77 (!) 148/70 (!) 142/76  Pulse: 72 71 72 76  Resp: (!) 23 (!) 27 (!) 26 (!) 32  Temp:      TempSrc:      SpO2: 100% 97% 97% 97%  Weight:      Height:       Frail elderly caucasian male not in distress. He is intubated. Head is nontraumatic. Neck is supple without bruit.    Cardiac exam no murmur or gallop. Lungs are clear to auscultation. Distal pulses are well felt.  Neurological Exam :  Intubated and off sedation. Eyes closed, not respond to voice or pain, not following any commands. Right forced gaze. PERRL. Fundi not visualized. Not blinking to visual threat bilaterally. Left lower facial weakness. Tongue midline inside mouth. LUE 0/5 and decreased tone. LLE 2/5 on pain stimulation. RUE 0/5 with increased tone and clonus with painful stimulation. RLE 3-/5 with pain. All deep tendon reflexes are brisker on the right than the left. Bilateral babinski positive. Sensation, coordination and gait not tested.  ASSESSMENT/PLAN [name] is a 72 y.o. male with history of previous ICH felt secondary to cerebral amyloid angiopathy, hyperlipidemia, hypertension, seizures, previous tobacco use,  and coronary artery disease presenting with slurred speech. He did not receive IV t-PA due to ICH.  Right frontal lobe large acute ICH with IVH - secondary to cerebral amyloid angiopathy with hematoma expansion and increasing cytotoxic edema and right to left midline shift  Resultant  right gaze deviation and left hemiplegia   F/U CT head 01/02/18 - Progressive large right  frontal hematoma now estimated at 86 cc volume  CT repeat 01/05/18 pending  VTE prophylaxis - SCDs Diet NPO time specified Fall precautions  No antithrombotic prior to admission, now on No antithrombotic  Ongoing aggressive stroke risk factor management  Therapy recommendations:  pending  Disposition:  Pending  Respiratory failure  Intubated for airway protection  CCM on board  off fentanyl  Still lack of responsive  Cerebral edema  Right large frontal ICH with IVH  With midline shift  3% saline on hold  On NS @ 30cc  Na 142->158->157->156  Na goal 150-155  Repeat CT head today  Hx of ICH due to CAA  08/2017 left parietal large ICH, and small right frontal SAH - CTA head and neck unremarkable - EF 50-55%, LDL 81 and A1C 5.9 - send to CIR   MRI 11/21/17 left occipital small subacute ICH  12/06/17 left frontal and left temporal ICH with left UE weakness  Simple partial seizure  10/23/17 - right facial twitching - partial seizure - started on keppra  11/10/17 - outpt saw Dr. Pearlean BrownieSethi - EEG slowing no seizure - keppra changed to depakote due to intolerance  Change depakote to 500mg  bid 12/07/2017  12/09/17 - episode of left facial twitching with post ictal and todd's paralysis - vimpat load and then vimpat 100mg  bid  01/03/18 - RUE partial rhythmic movement - loaded with depakote and resumed vimpat  LTM EEG no seizure but slow bilaterally x 2 days  depakote level 48->46  Continue depakote 375mg  Q8 and vimpat 100mg  bid.   Fever   Tmax 101.4->100.7  On zosyn  Blood culture NGTD  UA  WBC 6-30  CXR unremarkable  Other Stroke Risk Factors  Advanced age  Former cigarette smoker - quit  ETOH use, advised to drink no more than 1 drink per day.  Family hx stroke (mother)  Coronary artery disease  Other Active Problems  Leukocytosis WBC 14.1->13.2   Hospital day # 4  This patient is critically ill and at significant risk of neurological worsening, death and care requires constant monitoring of vital signs, hemodynamics,respiratory and cardiac monitoring, extensive review of multiple databases, frequent neurological assessment, discussion with family, other specialists and medical decision making of high complexity.I have made any additions or clarifications directly to the above note. I had long discussion with wife and daughters at bedside, updated pt current condition, treatment plan and potential prognosis. They expressed understanding and appreciation. I spent 40 minutes of neurocritical care time  in the care of  this patient.   Marvel PlanJindong Dillan Lunden, MD PhD Stroke Neurology 01/05/2018 12:37 PM   To contact Stroke Continuity provider, please refer to [email or web address]. After hours, contact General Neurology

## 2018-01-05 NOTE — Progress Notes (Signed)
PULMONARY / CRITICAL CARE MEDICINE   Name: PAXTON BINNS MRN: [id number] DOB: 1945-12-04    ADMISSION DATE:  01/06/2018 CONSULTATION DATE:  01/18/2018  REFERRING MD:  Dr. Pearlean Brownie, Neurology  CHIEF COMPLAINT:  Altered mental status.  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  He developed progressive Lt sided weakness and unable to speak.  He was found to have new Rt frontal ICH with 5 mm shift.  He required intubation for airway protection. PMHx of HTN, HLD, CAD s/p CABG.  SUBJECTIVE:  Focal seizure last on the night of 2/12. Some jerking motions overnight which did not correlate to seizure activity on EEG. Sedated on only 25 mcg Fentanyl.  Intubated and mechanically ventilated. No interaction with myself or family  VITAL SIGNS: BP 138/69   Pulse 69   Temp 97.7 F (36.5 C) (Axillary)   Resp (!) 23   Ht 6' (1.829 m)   Wt 177 lb 11.1 oz (80.6 kg)   SpO2 98%   BMI 24.10 kg/m   VENTILATOR SETTINGS: Vent Mode: PRVC FiO2 (%):  [40 %] 40 % Set Rate:  [14 bmp] 14 bmp Vt Set:  [550 mL] 550 mL PEEP:  [5 cmH20] 5 cmH20 Plateau Pressure:  [11 cmH20-14 cmH20] 11 cmH20  INTAKE / OUTPUT: I/O last 3 completed shifts: In: 4001.1 [I.V.:1098.6; NG/GT:2340; IV Piggyback:562.5] Out: 1995 [Urine:1995]  PHYSICAL EXAMINATION:  General - orally intubated and mechanically ventilated. Cardiac - median sternotomy scar. PMI unremarkable. RRR, no m g or rub Chest - unlabored, symmetric air movement, no wheezes, some rhonchi. Breathing above the set ventilator rate Abd - soft, non tender, no organomegaly, guarding  Ext - no edema Skin - no rashes Neuro - Does not eye open to voice or sternal rub. No limb movement, tonic right eye deviation. Pupils equal and reactive   LABS:  BMET Recent Labs  Lab 01/03/18 0614  01/04/18 0447  01/04/18 1814  01/05/18 0232 01/05/18 0438  NA 158*  157*   < > 157*   < > 157* 157* 156*  K 2.9*  --  3.1*  --   --   --  3.2*  CL 126*  --  124*  --   --   --  120*  CO2 21*  --  24  --   --   --  24  BUN 20  --  27*  --   --   --  27*  CREATININE 0.78  --  0.76  --   --   --  0.75  GLUCOSE 137*  --  159*  --   --   --  155*   < > = values in this interval not displayed.    Electrolytes Recent Labs  Lab 01/02/18 1313 01/03/18 0614 01/03/18 1753 01/04/18 0447 01/05/18 0438  CALCIUM  --  8.7*  --  8.5* 8.3*  MG 1.8 2.0 2.1 1.8 2.0  PHOS 2.7 2.7 1.9*  --   --     CBC Recent Labs  Lab 01/03/18 0614 01/04/18 0447 01/05/18 0438  WBC 15.4* 14.1* 13.2*  HGB 10.9* 10.1* 10.7*  HCT 34.6* 32.5* 34.3*  PLT 193 156 155    Coag's Recent Labs  Lab 01/15/2018 0720 01/04/18 0447  APTT 32  --   INR 1.03 1.34    Sepsis Markers Recent Labs  Lab 01/02/18 0528 01/02/18 0727 01/03/18 0614 01/03/18 2049 01/04/18 0447  LATICACIDVEN 2.7* 2.1* 2.2* 2.1*  --   PROCALCITON <0.10  --  <0.10  --  <0.10    ABG Recent Labs  Lab 01/03/18 0524 01/04/18 0355 01/05/18 0440  PHART 7.422 7.450 7.487*  PCO2ART 32.8 35.0 32.8  PO2ART 141.0* 81.0* 71.0*    Liver Enzymes Recent Labs  Lab 11/16/18 0720 01/04/18 0447  AST 19 17  ALT 18 13*  ALKPHOS 50 34*  BILITOT 0.8 0.6  ALBUMIN 3.2* 2.3*    Cardiac Enzymes No results for input(s): TROPONINI, PROBNP in the last 168 hours.  Glucose Recent Labs  Lab 01/04/18 1221 01/04/18 1702 01/04/18 1918 01/04/18 2327 01/05/18 0348 01/05/18 0746  GLUCAP 100* 115* 100* 120* 117* 122*    Imaging Dg Chest Port 1 View  Result Date: 01/05/2018 CLINICAL DATA:  Hypoxia EXAM: PORTABLE CHEST 1 VIEW COMPARISON:  January 04, 2018 FINDINGS: Endotracheal tube tip is 4.8 cm above the carina. Central catheter tip is in the superior vena cava. Nasogastric tube tip and side port are below the diaphragm. No pneumothorax. There is atelectatic change in the  lung bases with small pleural effusions bilaterally. Lungs elsewhere are clear. Heart size is upper normal, stable. Patient is status post coronary artery bypass grafting. There is aortic atherosclerosis. No adenopathy. No bone lesions. IMPRESSION: Tube and catheter positions as described without pneumothorax. Bibasilar atelectasis with small pleural effusions bilaterally. Stable cardiac silhouette. There is aortic atherosclerosis. Aortic Atherosclerosis (ICD10-I70.0). Electronically Signed   By: Bretta BangWilliam  Woodruff III M.D.   On: 01/05/2018 07:52   Dg Chest Port 1 View  Result Date: 01/04/2018 CLINICAL DATA:  High fever Intracranial hemorrhage EXAM: PORTABLE CHEST 1 VIEW COMPARISON:  01/03/2018 FINDINGS: Endotracheal tube position 3 cm from carina. LEFT central venous line tip in distal SVC. Normal cardiac silhouette. Bibasilar atelectasis. Small LEFT effusion upper lobes are clear. IMPRESSION: 1. Stable support apparatus 2. Stable basilar atelectasis and LEFT effusion Electronically Signed   By: Genevive BiStewart  Edmunds M.D.   On: 01/04/2018 11:04     STUDIES:  CT head 2/09 >> Rt lateral frontal hemorrhage 54 cc, edema and mass effect with 5 mm Rt to Lt shift, small volume SAH CT head 2/10 >> Increased Rt lateral frontal hemorrhage, 86 cc, 6 mm Rt to Lt shift  CULTURES: Blood 2/10 >>   ANTIBIOTICS: Zosyn 2/10 >>   SIGNIFICANT EVENTS: 2/09 Transfer from rehab to ICU 2/10 place CVL, increase 3% saline  LINES/TUBES: ETT 2/09 >>  Lt IJ CVL 2/10 >>   DISCUSSION: 72 yo with recurrent ICH with cerebral edema and midline shift complicated by compromised airway and aspiration pneumonia. Partial seizures after bleed. AED's were increased but mental status has not improved with control of seizures  ASSESSMENT / PLAN:  Recurrent ICH with cerebral edema.  keep SBP less than 140  Hx of seizures. Now controlled by EEG but mental status no better. Anticipate repeat CT today.  Hypertension. Hx of CAD. -  goal SBP < 140  Acute respiratory failure with compromised airway. - no plan to wean today.  Suspected aspiration pneumonia. Mixed flora on gram stain. No staph in culture. D/C Vanco - day 3 of zosyn   DVT prophylaxis - SCDs SUP - Protonix Nutrition - tube feeds Goals of care - full code  Updated pt's family at bedside.  Spent in excess of 32 minutes in the care of this acutely ill patient today, who is requiring life  support measures.    Penny Pia, MD [facility] 01/05/2018, 8:08 AM Pager:  [phone]

## 2018-01-05 NOTE — Progress Notes (Signed)
LTM discontinued; No skin breakdown was seen. Dr Dimitriu notified.

## 2018-01-05 NOTE — Progress Notes (Signed)
Pharmacy Antibiotic Note Delora FuelRichard A Kana is a 72 y.o. male admitted on 12/26/2017 with right frontal ICH. Currently on IV D#4 of Zosyn for aspiration pneumonia. Vancomycin was d/c'ed yesterday. WBC down to 13.2, Tm 100.37F, PCT neg   Zosyn 2/10>> Vanc 2/10>> 2/12  2/10 BCx>> ngtd  2/10 TA>> pending  2/10 MRSA PCR neg    Plan: 1. Zosyn 3.375 gm IV Q 8 hours (EI infusion)  2. Monitor cultures, clinical progress and LOT    Temp (24hrs), Avg:99.5 F (37.5 C), Min:97.7 F (36.5 C), Max:100.7 F (38.2 C)  Recent Labs  Lab 06/20/2018 0720 01/02/18 0528 01/02/18 0727 01/03/18 0614 01/03/18 2049 01/04/18 0447 01/05/18 0438  WBC 11.0* 12.1*  --  15.4*  --  14.1* 13.2*  CREATININE 0.70 0.67  --  0.78  --  0.76 0.75  LATICACIDVEN  --  2.7* 2.1* 2.2* 2.1*  --   --     Estimated Creatinine Clearance: 93 mL/min (by C-G formula based on SCr of 0.75 mg/dL).    Allergies  Allergen Reactions  . Other Dermatitis and Rash    Patient MUST have "HYPOALLERGENIC" SHEETS washed in SCENT-FREE DETERGENT!!! Wife showed me pics on her phone of what develops if he is placed on regular sheets and the patient develops LARGE, "SCALDED" areas that spread!!  . Sulfonamide Derivatives Other (See Comments)    Reaction not recalled by wife    Thank you for allowing pharmacy to be a part of this patient's care.  Vinnie LevelBenjamin Chela Sutphen, PharmD., BCPS Clinical Pharmacist Pager [phone]

## 2018-01-05 NOTE — Progress Notes (Signed)
Daily Progress Note   Patient Name: Jaime Pierce       Date: 01/05/2018 DOB: 07/28/1946  Age: 72 y.o. MRN#: [id number] Attending Physician: Rosalin Hawking, MD Primary Care Physician: Cassandria Anger, MD Admit Date: 01/04/2018  Reason for Consultation/Follow-up: Establishing goals of care  Subjective: Patient remains intubated, unresponsive, off sedation. No longer seizing. Plan for repeat CT scan this afternoon. Gave family emotional support. Encouraged them to ensure care for themselves and each other. Plan made for f/u PMT meeting tomorrow following results of CT scan.  Review of Systems  Unable to perform ROS: Intubated    Length of Stay: 4  Current Medications: Scheduled Meds:  . atorvastatin  40 mg Per Tube q1800  . baclofen  10 mg Oral TID  . chlorhexidine gluconate (MEDLINE KIT)  15 mL Mouth Rinse BID  . Chlorhexidine Gluconate Cloth  6 each Topical Daily  . gabapentin  100 mg Oral QHS  . mouth rinse  15 mL Mouth Rinse QID  . pantoprazole sodium  40 mg Per Tube Q24H  . potassium chloride  20 mEq Per Tube BID  . sodium bicarbonate  650 mg Per Tube BID  . sodium chloride flush  10-40 mL Intracatheter Q12H    Continuous Infusions: . sodium chloride 30 mL/hr at 01/05/18 1300  . feeding supplement (VITAL AF 1.2 CAL) 1,000 mL (01/05/18 1300)  . fentaNYL infusion INTRAVENOUS Stopped (01/05/18 1300)  . lacosamide (VIMPAT) IV Stopped (01/05/18 1119)  . niCARDipine Stopped (01/02/18 0500)  . piperacillin-tazobactam (ZOSYN)  IV 3.375 g (01/05/18 1255)  . sodium chloride (hypertonic) Stopped (01/03/18 1000)  . valproate sodium Stopped (01/05/18 0700)    PRN Meds: [DISCONTINUED] acetaminophen **OR** acetaminophen (TYLENOL) oral liquid 160 mg/5 mL **OR** acetaminophen,  fentaNYL, hydrALAZINE, ipratropium-albuterol, midazolam, sodium chloride flush  Physical Exam  Constitutional:  Ill appearing, frail  Cardiovascular: Normal rate and regular rhythm.  Pulmonary/Chest:  intubated  Neurological:  nonresponsive  Skin: Skin is warm and dry.  Nursing note and vitals reviewed.           Vital Signs: BP 106/69   Pulse 81   Temp 97.7 F (36.5 C) (Axillary)   Resp 20   Ht 6' (1.829 m)   Wt 80.6 kg (177 lb 11.1 oz)   SpO2 94%   BMI 24.10  kg/m  SpO2: SpO2: 94 % O2 Device: O2 Device: Ventilator O2 Flow Rate: O2 Flow Rate (L/min): 6 L/min  Intake/output summary:   Intake/Output Summary (Last 24 hours) at 01/05/2018 1323 Last data filed at 01/05/2018 1300 Gross per 24 hour  Intake 2832.79 ml  Output 1550 ml  Net 1282.79 ml   LBM: Last BM Date: 12/31/17 Baseline Weight: Weight: 75.3 kg (166 lb 0.1 oz) Most recent weight: Weight: 80.6 kg (177 lb 11.1 oz)       Palliative Assessment/Data: PPS: 10%    Flowsheet Rows     Most Recent Value  Intake Tab  Referral Department  Hospitalist  Unit at Time of Referral  Med/Surg Unit  Palliative Care Primary Diagnosis  Neurology  Date Notified  01/02/18  Palliative Care Type  New Palliative care  Reason for referral  Clarify Goals of Care  Date of Admission  01/05/2018  Date first seen by Palliative Care  01/02/18  # of days Palliative referral response time  0 Day(s)  # of days IP prior to Palliative referral  1  Clinical Assessment  Psychosocial & Spiritual Assessment  Palliative Care Outcomes      Patient Active Problem List   Diagnosis Date Noted  . Cytotoxic cerebral edema ([place]) 01/02/2018  . Brain herniation ([place]) 01/02/2018  . Acute respiratory failure ([place])   . Advance care planning   . Palliative care by specialist   . Goals of care, counseling/discussion   . Leukocytosis   . Intracerebral hemorrhage 12/10/2017  . Cerebral amyloid angiopathy ([place]) 12/09/2017  . Seizures ([place])  12/09/2017  . ICH (intracerebral hemorrhage) ([place]) 12/06/2017  . Partial seizure ([place]) 11/08/2017  . Positive colorectal cancer screening using Cologuard test 10/22/2017  . Hemiparesis affecting right side as late effect of cerebrovascular accident (CVA) ([place])   . Cough   . Nontraumatic hemorrhage of left cerebral hemisphere ([place])   . Pain   . Hyponatremia   . Benign essential HTN   . Hemiparesis of right dominant side as late effect of nontraumatic intracerebral hemorrhage ([place])   . Gait disturbance, post-stroke   . Aphasia as late effect of stroke   . SAH (subarachnoid hemorrhage) ([place]) 09/02/2017  . B12 deficiency 09/02/2017  . Well adult exam 06/16/2016  . Cigarette smoker 02/23/2016  . COPD GOLD II  12/24/2014  . CAD (coronary artery disease) 07/22/2011  . HTN (hypertension) 07/22/2011  . Dyslipidemia 07/22/2011  . Smoker 07/22/2011    Palliative Care Assessment & Plan   Patient Profile: 72 y.o. male  with past medical history of recent R parietal ICH from which he was recovering in rehab, cerebral amyloid angiopathy, L sided ICH in October, seizures, HTN, hyperlipidemia admitted on 01/11/2018 with new frontal ICH with midline shift that presented as slurred speech, choking on some water, altered mental status. Repeat CT on 2/10 showed increased blood volume and increasing L to R shift an Respiratory status decompensated requiring intubation. Concern for aspiration pneumonia. Palliative medicine consulted for [place].  Assessment/Recommendations/Plan   Continue current care  PMT will f/u with family tomorrow or later this week after CT scan results for further GOC  Goals of Care and Additional Recommendations:  Limitations on Scope of Treatment: Full Scope Treatment  Code Status:  DNR  Prognosis:   Unable to determine  Discharge Planning:  To Be Determined  Care plan was discussed with patient's spouse and daughters.  Thank you for allowing the Palliative Medicine  Team to assist in the care  of this patient.   Time In: 1250 Time Out: 1315 Total Time 25 mins Prolonged Time Billed no      Greater than 50%  of this time was spent counseling and coordinating care related to the above assessment and plan.  Mariana Kaufman, AGNP-C Palliative Medicine   Please contact Palliative Medicine Team phone at [phone] for questions and concerns.

## 2018-01-05 NOTE — Progress Notes (Signed)
LTM maintenance; no skin breakdown was seen. All impedances still under 5 kohms.

## 2018-01-05 NOTE — Procedures (Signed)
  Video EEG Monitoring Report    Dates of monitoring:   01/04/17 @07 :30 to 01/05/17 @07 :30 Recording day:    2 (started on 2/11) EEG Number:    [id number] Requesting provider:   Caryl PinaEric Lindzen, MD Interpreting physician:  Wynelle Bourgeoisan-Andrei Casimir Barcellos, MD  CPT:  [phone] ICD-10:  R56.9   Present History: 72 year old man with intracranial hemorrhage and recurrent seizures. Continuous EEG monitoring is requested to evaluate for seizures.    EEG Classification  Abnormal, Stupor <-> Awake  PDR  7 Hz, seen occasionally on the left  HR  70 bpm and regular     Background abnormalities:  1. Asymmetry, decreased amplitude, right hemisphere  2. Continuous slow, right>left hemisphere, generalized     Periodic, rhythmic or epileptiform abnormalities:  none   Ictal phenomena:  none   EEG DETAILS:  TYPE OF RECORDING: At least 18-channel digital EEG with using a standard international 10-20 placement with additional EEG electrodes, and 1 additional channel dedicated to the EKG, at a sampling rate of at least 256 Hz. Video was recorded throughout the study. The recording was interpreted using digital review software allowing for montage reformatting, gain and filter changes. Each page was reviewed manually. Automatic spike and seizure detection software and quantitative analysis tools were used as needed.   Description of EEG features: The recording reveals a continuous, variable and reactive asymmetricbackground.  On the right, there is a persistent decreased amplitude of faster activities, including theta and alpha ranges. There is continuous, waxing and waning, diffuse 2-3 Hz delta dominant background with intermittent faster delta rhythms.   On the left, better formed, better organized activity is seen, with a dominant diffuse 6-7 Hz for the most part.   Spontaneously and with stimulation, however, arousals are seen and a posterior dominant rhythm emerges on the left, reaching 7-8 Hz. The right  hemisphere continues to be asymmetrically slower, and no well defined PDR is seen even during maximal arousals.   Push Button Events: 3 events marked on 02/12 @17 :51, 19:36, 20:55   Clinical: there is no obvious clinical semiology on video, although there is increased respiratory effort; not that the camera only shows the patient's head and shoulders.  EEG: no ictal pattern - all events are marked during periods of relatively more awake EEG  Interpretation: This EEG is indicative of a focal area of structural abnormality and cortical dysfunction in the right hemisphere. Additionally, there is a moderate diffuse encephalopathy, judging by the best rhythms achieved by the left hemisphere. Sedating medication effect cannot be excluded.   No epileptiform discharges or seizures are seen.   Three events are marked on the recording, but no clinical semiology is apparent on video. They all occur during more awake EEG segments, but there is no EEG seizure pattern associated with these events.

## 2018-01-06 DIAGNOSIS — Z515 Encounter for palliative care: Secondary | ICD-10-CM

## 2018-01-06 DIAGNOSIS — I619 Nontraumatic intracerebral hemorrhage, unspecified: Secondary | ICD-10-CM

## 2018-01-06 DIAGNOSIS — Z01818 Encounter for other preprocedural examination: Secondary | ICD-10-CM

## 2018-01-06 DIAGNOSIS — R0603 Acute respiratory distress: Secondary | ICD-10-CM

## 2018-01-06 DIAGNOSIS — Z8679 Personal history of other diseases of the circulatory system: Secondary | ICD-10-CM

## 2018-01-06 DIAGNOSIS — Z7189 Other specified counseling: Secondary | ICD-10-CM

## 2018-01-06 DIAGNOSIS — R509 Fever, unspecified: Secondary | ICD-10-CM

## 2018-01-06 LAB — CBC WITH DIFFERENTIAL/PLATELET
Basophils Absolute: 0.1 10*3/uL (ref 0.0–0.1)
Basophils Relative: 1 %
EOS ABS: 0 10*3/uL (ref 0.0–0.7)
EOS PCT: 0 %
HCT: 33.8 % — ABNORMAL LOW (ref 39.0–52.0)
Hemoglobin: 10.6 g/dL — ABNORMAL LOW (ref 13.0–17.0)
LYMPHS ABS: 1.9 10*3/uL (ref 0.7–4.0)
Lymphocytes Relative: 18 %
MCH: 29.9 pg (ref 26.0–34.0)
MCHC: 31.4 g/dL (ref 30.0–36.0)
MCV: 95.5 fL (ref 78.0–100.0)
MONO ABS: 1.1 10*3/uL — AB (ref 0.1–1.0)
Monocytes Relative: 11 %
Neutro Abs: 7.6 10*3/uL (ref 1.7–7.7)
Neutrophils Relative %: 70 %
PLATELETS: 155 10*3/uL (ref 150–400)
RBC: 3.54 MIL/uL — AB (ref 4.22–5.81)
RDW: 15.5 % (ref 11.5–15.5)
WBC: 10.7 10*3/uL — AB (ref 4.0–10.5)

## 2018-01-06 LAB — BASIC METABOLIC PANEL
Anion gap: 11 (ref 5–15)
BUN: 27 mg/dL — AB (ref 6–20)
CALCIUM: 8.1 mg/dL — AB (ref 8.9–10.3)
CO2: 26 mmol/L (ref 22–32)
Chloride: 118 mmol/L — ABNORMAL HIGH (ref 101–111)
Creatinine, Ser: 0.71 mg/dL (ref 0.61–1.24)
GFR calc non Af Amer: >60 mL/min (ref >60)
Glucose, Bld: 145 mg/dL — ABNORMAL HIGH (ref 65–99)
Potassium: 3.2 mmol/L — ABNORMAL LOW (ref 3.5–5.1)
SODIUM: 155 mmol/L — AB (ref 135–145)

## 2018-01-06 LAB — POCT I-STAT 3, ART BLOOD GAS (G3+)
ACID-BASE EXCESS: 4 mmol/L — AB (ref 0.0–2.0)
BICARBONATE: 26.8 mmol/L (ref 20.0–28.0)
O2 SAT: 98 %
PCO2 ART: 34.6 mmHg (ref 32.0–48.0)
PH ART: 7.495 — AB (ref 7.350–7.450)
PO2 ART: 86 mmHg (ref 83.0–108.0)
Patient temperature: 98
TCO2: 28 mmol/L (ref 22–32)

## 2018-01-06 LAB — GLUCOSE, CAPILLARY
GLUCOSE-CAPILLARY: 108 mg/dL — AB (ref 65–99)
Glucose-Capillary: 137 mg/dL — ABNORMAL HIGH (ref 65–99)
Glucose-Capillary: 150 mg/dL — ABNORMAL HIGH (ref 65–99)
Glucose-Capillary: 140 mg/dL — ABNORMAL HIGH (ref 65–99)
Glucose-Capillary: 113 mg/dL — ABNORMAL HIGH (ref 65–99)
Glucose-Capillary: 113 mg/dL — ABNORMAL HIGH (ref 65–99)

## 2018-01-06 LAB — MAGNESIUM: Magnesium: 2 mg/dL (ref 1.7–2.4)

## 2018-01-06 LAB — SODIUM
SODIUM: 155 mmol/L — AB (ref 135–145)
Sodium: 153 mmol/L — ABNORMAL HIGH (ref 135–145)

## 2018-01-06 NOTE — Progress Notes (Signed)
Daily Progress Note   Patient Name: Jaime Pierce       Date: 01/06/2018 DOB: 10-04-1946  Age: 72 y.o. MRN#: [id number] Attending Physician: Jaime Hawking, MD  Primary Care Physician: Jaime Anger, MD Admit Date: 01/15/2018  Reason for Consultation/Follow-up: Establishing goals of care  Subjective: Patient remains intubated, responds to pain. Appears uncomfortable upon arrival to room with tachypnea and increased work of breathing. Per family, recently given a bath and his discomfort began afterwards. Discussed with RN. Fentanyl infusion re-started at 71mg/hr and patient given 252m bolus via infusion. Appears more comfortable with decreased respirations after bolus given.   Review of Systems  Unable to perform ROS: Intubated   GOC: Wife, two daughters, and son-in-law at bedside. I provided emotional support to family as we worked on getting him more comfortable. They are tearful and trying to console their father. Therapeutic listening as family shares stories of Jaime Pierce is evident he is a very loved man. Family still remains with slight hope for improvement but also seem to be processing poor prognosis. PMT contact information given and reassured them of our continued support with big decisions they are faced with. Family appreciative.   Length of Stay: 5  Current Medications: Scheduled Meds:  . atorvastatin  40 mg Per Tube q1800  . baclofen  10 mg Oral TID  . chlorhexidine gluconate (MEDLINE KIT)  15 mL Mouth Rinse BID  . Chlorhexidine Gluconate Cloth  6 each Topical Daily  . gabapentin  100 mg Oral QHS  . mouth rinse  15 mL Mouth Rinse QID  . pantoprazole sodium  40 mg Per Tube Q24H  . sodium bicarbonate  650 mg Per Tube BID  . sodium chloride flush  10-40 mL  Intracatheter Q12H    Continuous Infusions: . sodium chloride 30 mL/hr at 01/06/18 1600  . feeding supplement (VITAL AF 1.2 CAL) 1,000 mL (01/06/18 1600)  . fentaNYL infusion INTRAVENOUS 25 mcg/hr (01/06/18 1600)  . lacosamide (VIMPAT) IV Stopped (01/06/18 1021)  . niCARDipine Stopped (01/02/18 0500)  . piperacillin-tazobactam (ZOSYN)  IV Stopped (01/06/18 1608)  . sodium chloride (hypertonic) Stopped (01/03/18 1000)  . valproate sodium Stopped (01/06/18 1412)    PRN Meds: [DISCONTINUED] acetaminophen **OR** acetaminophen (TYLENOL) oral liquid 160 mg/5 mL **OR** acetaminophen, fentaNYL, hydrALAZINE, ipratropium-albuterol, midazolam, sodium chloride flush  Physical Exam  HENT:  Head: Normocephalic and atraumatic.  Cardiovascular: Tachycardia present.  Pulmonary/Chest: Accessory muscle usage present. Tachypnea noted. He is in respiratory distress.  RN at bedside initiating fentanyl infusion and fentanyl bolus via infusion given  Neurological:  Responds to pain, otherwise unresponsive  Skin: Skin is warm and dry.  Nursing note and vitals reviewed.          Vital Signs: BP 129/66   Pulse 70   Temp 99.8 F (37.7 C) (Axillary)   Resp (!) 22   Ht 6' (1.829 m)   Wt 84.7 kg (186 lb 11.7 oz)   SpO2 98%   BMI 25.33 kg/m  SpO2: SpO2: 98 % O2 Device: O2 Device: Ventilator O2 Flow Rate: O2 Flow Rate (L/min): 6 L/min  Intake/output summary:   Intake/Output Summary (Last 24 hours) at 01/06/2018 1710 Last data filed at 01/06/2018 1600 Gross per 24 hour  Intake 2517.25 ml  Output 1560 ml  Net 957.25 ml   LBM: Last BM Date: 01/06/18 Baseline Weight: Weight: 75.3 kg (166 lb 0.1 oz) Most recent weight: Weight: 84.7 kg (186 lb 11.7 oz)       Palliative Assessment/Data: PPS: 10%    Flowsheet Rows     Most Recent Value  Intake Tab  Referral Department  Hospitalist  Unit at Time of Referral  Med/Surg Unit  Palliative Care Primary Diagnosis  Neurology  Date Notified  01/02/18    Palliative Care Type  New Palliative care  Reason for referral  Clarify Goals of Care  Date of Admission  01/14/2018  Date first seen by Palliative Care  01/02/18  # of days Palliative referral response time  0 Day(s)  # of days IP prior to Palliative referral  1  Clinical Assessment  Psychosocial & Spiritual Assessment  Palliative Care Outcomes      Patient Active Problem List   Diagnosis Date Noted  . Cytotoxic cerebral edema ([place]) 01/02/2018  . Brain herniation ([place]) 01/02/2018  . Acute respiratory failure ([place])   . Advance care planning   . Palliative care by specialist   . Goals of care, counseling/discussion   . Leukocytosis   . Intracerebral hemorrhage 12/10/2017  . Cerebral amyloid angiopathy (CODE) 12/09/2017  . Seizures ([place]) 12/09/2017  . ICH (intracerebral hemorrhage) ([place]) 12/06/2017  . Partial seizure ([place]) 11/08/2017  . Positive colorectal cancer screening using Cologuard test 10/22/2017  . Hemiparesis affecting right side as late effect of cerebrovascular accident (CVA) ([place])   . Cough   . Nontraumatic hemorrhage of left cerebral hemisphere ([place])   . Pain   . Hyponatremia   . Benign essential HTN   . Hemiparesis of right dominant side as late effect of nontraumatic intracerebral hemorrhage ([place])   . Gait disturbance, post-stroke   . Aphasia as late effect of stroke   . SAH (subarachnoid hemorrhage) ([place]) 09/02/2017  . B12 deficiency 09/02/2017  . Well adult exam 06/16/2016  . Cigarette smoker 02/23/2016  . COPD GOLD II  12/24/2014  . CAD (coronary artery disease) 07/22/2011  . HTN (hypertension) 07/22/2011  . Dyslipidemia 07/22/2011  . Smoker 07/22/2011    Palliative Care Assessment & Plan   Patient Profile: 72 y.o. male  with past medical history of recent R parietal ICH from which he was recovering in rehab, cerebral amyloid angiopathy, L sided ICH in October, seizures, HTN, hyperlipidemia admitted on 01/10/2018 with new frontal ICH with midline shift  that presented as slurred speech, choking on some water, altered mental  status. Repeat CT on 2/10 showed increased blood volume and increasing L to R shift an Respiratory status decompensated requiring intubation. Concern for aspiration pneumonia. Palliative medicine consulted for [place].  Assessment/Recommendations/Plan   Continue current level of care. No resuscitation.  Family in the process of accepting poor prognosis. Not ready to make decisions regarding comfort measures.   PMT will continue to follow and support patient/family.   Code Status:  DNR  Prognosis:  Guarded  Discharge Planning:  To Be Determined  Care plan was discussed with wife, two daughters, son-in-law, RN  Thank you for allowing the Palliative Medicine Team to assist in the care of this patient.   Time In: 1400 Time Out: 1435 Total Time 26mn Prolonged Time Billed no      Greater than 50%  of this time was spent counseling and coordinating care related to the above assessment and plan.  MIhor Dow FNP-C Palliative Medicine Team  Phone: [phone]: [phone] Please contact Palliative Medicine Team phone at [phone] questions and concerns.

## 2018-01-06 NOTE — Progress Notes (Signed)
STROKE TEAM PROGRESS NOTE   SUBJECTIVE (INTERVAL HISTORY) Daughters and wife are at bedside. Repeat CT head showed similar findings comparing with 3 days ago, no significant change.  Sodium 155. Still intubated on vent. Had fever this am, CXR and UA not impressive.    OBJECTIVE Temp:  [98.8 F (37.1 C)-101.1 F (38.4 C)] 98.8 F (37.1 C) (02/14 2007) Pulse Rate:  [60-86] 70 (02/14 2100) Cardiac Rhythm: Heart block (02/14 0000) Resp:  [20-37] 24 (02/14 2100) BP: (115-174)/(62-99) 129/63 (02/14 2100) SpO2:  [92 %-100 %] 98 % (02/14 2100) FiO2 (%):  [40 %] 40 % (02/14 1950) Weight:  [186 lb 11.7 oz (84.7 kg)] 186 lb 11.7 oz (84.7 kg) (02/14 0434)  CBC:  Recent Labs  Lab 01/05/18 0438 01/06/18 0408  WBC 13.2* 10.7*  NEUTROABS 9.8* 7.6  HGB 10.7* 10.6*  HCT 34.3* 33.8*  MCV 95.3 95.5  PLT 155 155    Basic Metabolic Panel:  Recent Labs  Lab 01/03/18 0614  01/03/18 1753  01/05/18 0438  01/06/18 0408 01/06/18 1156 01/06/18 1751  NA 158*  157*   < > 159*   < > 156*   < > 155* 155* 153*  K 2.9*  --   --    < > 3.2*  --  3.2*  --   --   CL 126*  --   --    < > 120*  --  118*  --   --   CO2 21*  --   --    < > 24  --  26  --   --   GLUCOSE 137*  --   --    < > 155*  --  145*  --   --   BUN 20  --   --    < > 27*  --  27*  --   --   CREATININE 0.78  --   --    < > 0.75  --  0.71  --   --   CALCIUM 8.7*  --   --    < > 8.3*  --  8.1*  --   --   MG 2.0  --  2.1   < > 2.0  --  2.0  --   --   PHOS 2.7  --  1.9*  --   --   --   --   --   --    < > = values in this interval not displayed.    Lipid Panel:     Component Value Date/Time   CHOL 173 12/30/2017 0720   TRIG 58 01/16/2018 0720   TRIG 65 09/21/2006 0832   HDL 32 (L) 12/25/2017 0720   CHOLHDL 5.4 12/29/2017 0720   VLDL 12 01/02/2018 0720   LDLCALC 129 (H) 01/02/2018 0720   HgbA1c:  Lab Results  Component Value Date   HGBA1C 5.9 (H) 09/02/2017   Urine Drug Screen:     Component Value Date/Time   LABOPIA  NONE DETECTED 12/06/2017 1506   COCAINSCRNUR NONE DETECTED 12/06/2017 1506   LABBENZ NONE DETECTED 12/06/2017 1506   AMPHETMU NONE DETECTED 12/06/2017 1506   THCU NONE DETECTED 12/06/2017 1506   LABBARB NONE DETECTED 12/06/2017 1506    Alcohol Level     Component Value Date/Time   ETH <10 12/06/2017 0950    IMAGING I have personally reviewed the radiological images below and agree with the radiology interpretations.  Ct Head Wo Contrast 01/19/2018  IMPRESSION: 1. Progressive large right frontal hematoma now estimated at 80 cc volume. There is new right lateral ventricular extension and increased local subarachnoid blood. Midline shift measures 6 mm. No hydrocephalus. 2. Sequela of prior lobar hemorrhages in the bilateral parietal lobe. 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 Suspect amyloid angiopathy.  Ct Head Wo Contrast 12/29/2017 IMPRESSION:  1. Right lateral frontal lobe acute hemorrhage measuring up to 5.2 cm, 54 cc. Associated edema and mass effect with partial effacement of right lateral ventricle and 5 mm right-to-left midline shift. No herniation.  2. Small volume subarachnoid hemorrhage over right cerebral convexity.  3. Interval dispersion of right parietal and right temporal hematoma with residual encephalomalacia.  4. Stable chronic left parietal infarction. Stable chronic microvascular ischemic changes and parenchymal volume loss of the brain.   Dg Chest Port 1 View 01/03/2018  IMPRESSION: Stable support apparatus and bibasilar atelectasis.   LTM EEG 01/04/18 This EEG is indicative of a focal area of structural abnormality and cortical dysfunction in the right hemisphere. Additionally, there is a moderate diffuse encephalopathy, judging by the best rhythms achieved by the left hemisphere. Sedating medication effect cannot be excluded.  No epileptiform discharges or seizures are seen.  Seven events are marked on the recording, but no clinical semiology is  apparent on video, and there is no EEG seizure pattern associated with these events.   LTM EEG 01/05/18 This EEG is indicative of a focal area of structural abnormality and cortical dysfunction in the right hemisphere. Additionally, there is a moderate diffuse encephalopathy, judging by the best rhythms achieved by the left hemisphere. Sedating medication effect cannot be excluded.  No epileptiform discharges or seizures are seen.  Three events are marked on the recording, but no clinical semiology is apparent on video. They all occur during more awake EEG segments, but there is no EEG seizure pattern associated with these events.   Ct Head Wo Contrast 01/05/2018 IMPRESSION: 1. Unchanged right frontal lobe hematoma. Unchanged edema and mass effect with 6 mm of leftward midline shift. 2. Unchanged small volume subarachnoid and intraventricular hemorrhage.   Dg Chest Port 1 View 01/05/2018 IMPRESSION: Tube and catheter positions as described without pneumothorax. Bibasilar atelectasis with small pleural effusions bilaterally. Stable cardiac silhouette. There is aortic atherosclerosis. Aortic Atherosclerosis (ICD10-I70.0).    PHYSICAL EXAM Vitals:   01/06/18 1950 01/06/18 2000 01/06/18 2007 01/06/18 2100  BP:  133/66  129/63  Pulse: 66 65  70  Resp: (!) 23 (!) 22  (!) 24  Temp:   98.8 F (37.1 C)   TempSrc:   Axillary   SpO2: 100% 98%  98%  Weight:      Height:       Frail elderly caucasian male not in distress. He is intubated. Head is nontraumatic. Neck is supple without bruit.    Cardiac exam no murmur or gallop. Lungs are clear to auscultation. Distal pulses are well felt.  Neurological Exam :  Intubated and off sedation. Eyes closed, not respond to voice or pain, not following any commands. With pain stimulation, pt is able to open eyes, pupils at midline, no tracking and PERRL. Fundi not visualized. Not blinking to visual threat bilaterally. Left lower facial weakness. Tongue midline  inside mouth. LUE 0/5 and decreased tone. LLE 1/5 on pain stimulation. RUE 0/5 with increased tone and clonus with painful stimulation. RLE 1/5 with pain. All deep tendon reflexes are brisker on the right than the left. Bilateral babinski positive. Sensation, coordination and gait  not tested.  ASSESSMENT/PLAN [name] is a 72 y.o. male with history of previous ICH felt secondary to cerebral amyloid angiopathy, hyperlipidemia, hypertension, seizures, previous tobacco use, and coronary artery disease presenting with slurred speech. He did not receive IV t-PA due to ICH.  Right frontal lobe large acute ICH with IVH - secondary to cerebral amyloid angiopathy with hematoma expansion and increasing cytotoxic edema and right to left midline shift  Resultant  right gaze deviation and left hemiplegia   F/U CT head 01/02/18 - Progressive large right frontal hematoma now estimated at 86 cc volume  CT repeat 01/05/18 no significant change  VTE prophylaxis - SCDs Diet NPO time specified Fall precautions  No antithrombotic prior to admission, now on No antithrombotic  Ongoing aggressive stroke risk factor management  Therapy recommendations:  pending  Disposition:  Pending  Respiratory failure  Intubated for airway protection  CCM on board  off fentanyl  Mental status not candidate for extubation  Cerebral edema  Right large frontal ICH with IVH  With midline shift  3% saline on hold  On NS @ 30cc  Na 142->158->157->156->155  Na goal 150-155  Repeat CT head 01/05/18 no significant change  Hx of ICH due to CAA  08/2017 left parietal large ICH, and small right frontal SAH - CTA head and neck unremarkable - EF 50-55%, LDL 81 and A1C 5.9 - send to CIR   MRI 11/21/17 left occipital small subacute ICH  12/06/17 left frontal and left temporal ICH with left UE weakness  Simple partial seizure  10/23/17 - right facial twitching - partial seizure - started on  keppra  11/10/17 - outpt saw Dr. Pearlean BrownieSethi - EEG slowing no seizure - keppra changed to depakote due to intolerance  Change depakote to 500mg  bid 12/07/2017  12/09/17 - episode of left facial twitching with post ictal and todd's paralysis - vimpat load and then vimpat 100mg  bid  01/03/18 - RUE partial rhythmic movement - loaded with depakote and resumed vimpat  LTM EEG no seizure but slow bilaterally x 2 days  depakote level 48->46->pending  Continue depakote 375mg  Q8 and vimpat 100mg  bid.   Fever   Tmax 101.4->100.7->101.4  On zosyn  Blood culture NGTD  UA WBC 6-30  CXR unremarkable x 2  Other Stroke Risk Factors  Advanced age  Former cigarette smoker - quit  ETOH use, advised to drink no more than 1 drink per day.  Family hx stroke (mother)  Coronary artery disease  Other Active Problems  Leukocytosis WBC 14.1->13.2->10.7   Hospital day # 5  This patient is critically ill and at significant risk of neurological worsening, death and care requires constant monitoring of vital signs, hemodynamics,respiratory and cardiac monitoring, extensive review of multiple databases, frequent neurological assessment, discussion with family, other specialists and medical decision making of high complexity.I have made any additions or clarifications directly to the above note. I had long discussion with wife and daughters at bedside, updated pt current condition, treatment plan and potential prognosis. They expressed understanding and appreciation. I spent 45 minutes of neurocritical care time  in the care of  this patient.   Marvel PlanJindong Tristram Milian, MD PhD Stroke Neurology 01/06/2018 9:59 PM   To contact Stroke Continuity provider, please refer to [email or web address]. After hours, contact General Neurology

## 2018-01-06 NOTE — Progress Notes (Signed)
PULMONARY / CRITICAL CARE MEDICINE   Name: Jaime Pierce MRN: [id number] DOB: 26-Jun-1946    ADMISSION DATE:  12/24/2017 CONSULTATION DATE:  01/09/2018  REFERRING MD:  Dr. Leonie Man, Neurology  CHIEF COMPLAINT:  Altered mental status.  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  He developed progressive Lt sided weakness and unable to speak.  He was found to have new Rt frontal ICH with 5 mm shift.  He required intubation for airway protection. PMHx of HTN, HLD, CAD s/p CABG.  SUBJECTIVE:   No sig change.  No sig change on CT head.  Palliative care met with family yesterday, they are lilkely not ready to make decisions regarding withdrawal. Dr Erlinda Hong met with family today.  VITAL SIGNS: BP (!) 148/71   Pulse 70   Temp (!) 101.1 F (38.4 C) (Oral)   Resp (!) 23   Ht 6' (1.829 m)   Wt 84.7 kg (186 lb 11.7 oz)   SpO2 98%   BMI 25.33 kg/m   VENTILATOR SETTINGS: Vent Mode: PRVC FiO2 (%):  [40 %] 40 % Set Rate:  [14 bmp] 14 bmp Vt Set:  [550 mL] 550 mL PEEP:  [5 cmH20] 5 cmH20 Plateau Pressure:  [8 cmH20-10 cmH20] 10 cmH20  INTAKE / OUTPUT: I/O last 3 completed shifts: In: 3967.2 [I.V.:1107.2; NG/GT:2340; IV Piggyback:520] Out: 2385 [Urine:2385]  PHYSICAL EXAMINATION: Gen:      No acute distress HEENT:  EOMI, sclera anicteric, ETT in place Neck:     No masses; no thyromegaly Lungs:    Clear to auscultation bilaterally; normal respiratory effort CV:         Regular rate and rhythm; no murmurs Abd:      + bowel sounds; soft, non-tender; no palpable masses, no distension Ext:    No edema; adequate peripheral perfusion Skin:      Warm and dry; no rash Neuro: alert and oriented x 3 Psych: normal mood and affect  LABS:  BMET Recent Labs  Lab 01/04/18 0447  01/05/18 0438  01/05/18 1757 01/05/18 2328 01/06/18 0408  NA 157*   < > 156*    < > 157* 153* 155*  K 3.1*  --  3.2*  --   --   --  3.2*  CL 124*  --  120*  --   --   --  118*  CO2 24  --  24  --   --   --  26  BUN 27*  --  27*  --   --   --  27*  CREATININE 0.76  --  0.75  --   --   --  0.71  GLUCOSE 159*  --  155*  --   --   --  145*   < > = values in this interval not displayed.    Electrolytes Recent Labs  Lab 01/02/18 1313 01/03/18 3545 01/03/18 1753 01/04/18 0447 01/05/18 0438 01/06/18 0408  CALCIUM  --  8.7*  --  8.5* 8.3* 8.1*  MG 1.8 2.0 2.1 1.8 2.0 2.0  PHOS 2.7 2.7 1.9*  --   --   --     CBC Recent Labs  Lab 01/04/18 0447 01/05/18 0438 01/06/18 0408  WBC 14.1* 13.2* 10.7*  HGB 10.1* 10.7* 10.6*  HCT 32.5* 34.3* 33.8*  PLT 156 155 155    Coag's Recent  Labs  Lab 01/18/2018 0720 01/04/18 0447  APTT 32  --   INR 1.03 1.34    Sepsis Markers Recent Labs  Lab 01/02/18 0727 01/03/18 0614 01/03/18 2049 01/04/18 0447 01/05/18 0752  LATICACIDVEN 2.1* 2.2* 2.1*  --   --   PROCALCITON  --  <0.10  --  <0.10 0.26    ABG Recent Labs  Lab 01/04/18 0355 01/05/18 0440 01/06/18 0344  PHART 7.450 7.487* 7.495*  PCO2ART 35.0 32.8 34.6  PO2ART 81.0* 71.0* 86.0    Liver Enzymes Recent Labs  Lab 01/14/2018 0720 01/04/18 0447  AST 19 17  ALT 18 13*  ALKPHOS 50 34*  BILITOT 0.8 0.6  ALBUMIN 3.2* 2.3*    Cardiac Enzymes No results for input(s): TROPONINI, PROBNP in the last 168 hours.  Glucose Recent Labs  Lab 01/05/18 1153 01/05/18 1539 01/05/18 1926 01/05/18 2324 01/06/18 0331 01/06/18 0848  GLUCAP 125* 99 127* 106* 137* 150*    Imaging Ct Head Wo Contrast  Result Date: 01/05/2018 CLINICAL DATA:  Stroke follow-up. EXAM: CT HEAD WITHOUT CONTRAST TECHNIQUE: Contiguous axial images were obtained from the base of the skull through the vertex without intravenous contrast. COMPARISON:  01/02/2018 FINDINGS: Brain: Right lateral frontal lobe hematoma has not significantly changed in size, measuring 6.2 x 5.5 x 4.6 cm (volume  of approximately 78 cc). Surrounding vasogenic edema is unchanged, as is associated mass effect including 6 mm of leftward midline shift. Small volume hemorrhage in the occipital horns of both lateral ventricles demonstrates at most minimal mixed interval changes. The ventricles are unchanged in size, with mild dilatation of the left lateral ventricle again noted. Small volume subarachnoid hemorrhage is again noted over the right cerebral convexity and in the left sylvian fissure. The sequelae of prior hemorrhages are again noted in the parietal lobes, left occipital lobe, and anterior right temporal lobe with developing encephalomalacia. No new hemorrhage or acute large territory infarct is identified. Vascular: Calcified atherosclerosis at the skull base. No hyperdense vessel. Skull: No fracture or focal osseous lesion. Sinuses/Orbits: Left maxillary sinus mucous retention cyst. Mild scattered paranasal sinus mucosal thickening elsewhere. Small left mastoid effusion. Unremarkable orbits. Other: Mild left forehead swelling. IMPRESSION: 1. Unchanged right frontal lobe hematoma. Unchanged edema and mass effect with 6 mm of leftward midline shift. 2. Unchanged small volume subarachnoid and intraventricular hemorrhage. Electronically Signed   By: Logan Bores M.D.   On: 01/05/2018 15:12     STUDIES:  CT head 2/09 >> Rt lateral frontal hemorrhage 54 cc, edema and mass effect with 5 mm Rt to Lt shift, small volume SAH CT head 2/10 >> Increased Rt lateral frontal hemorrhage, 86 cc, 6 mm Rt to Lt shift CT head 2/13>>> 1. Unchanged right frontal lobe hematoma. Unchanged edema and mass effect with 6 mm of leftward midline shift. 2. Unchanged small volume subarachnoid and intraventricular hemorrhage.  CULTURES: Blood 2/10 >>   ANTIBIOTICS: Zosyn 2/10 >>   SIGNIFICANT EVENTS: 2/09 Transfer from rehab to ICU 2/10 place CVL, increase 3% saline  LINES/TUBES: ETT 2/09 >>  Lt IJ CVL 2/10 >>    DISCUSSION: 72 yo with recurrent ICH with cerebral edema and midline shift complicated by compromised airway and aspiration pneumonia. Partial seizures after bleed. AED's were increased but mental status has not improved with control of seizures  ASSESSMENT / PLAN:  Recurrent ICH with cerebral edema. Hx of seizures. Now controlled by EEG but mental status no better. No sig change on repeat head CT  PLAN -  Poor prognosis  Per neuro  Ongoing goals of care discussion with family   Hypertension. Hx of CAD. - goal SBP < 140  Acute respiratory failure with compromised airway. Vent support - 8cc/kg  F/u CXR  F/u ABG no plan to wean today.  Suspected aspiration pneumonia. Mixed flora on gram stain. No staph in culture. D/C Vanco - continue zosyn  DVT prophylaxis - SCDs SUP - Protonix Nutrition - tube feeds Goals of care - full code  The patient is critically ill with multiple organ system failure and requires high complexity decision making for assessment and support, frequent evaluation and titration of therapies, advanced monitoring, review of radiographic studies and interpretation of complex data.   Critical Care Time devoted to patient care services, exclusive of separately billable procedures, described in this note is 35 minutes.   Marshell Garfinkel MD [facility] Pager [phone] If no answer or after 3pm call: [phone] 01/06/2018, 12:57 PM

## 2018-01-06 NOTE — Care Management Important Message (Signed)
Important Message  Patient Details  Name: Jaime Pierce MRN: [id number] Date of Birth: 1946/05/07   Medicare Important Message Given:  Yes    Dorena BodoIris Elliett Guarisco 01/06/2018, 12:20 PM

## 2018-01-07 ENCOUNTER — Ambulatory Visit: Payer: Medicare HMO | Admitting: Physical Therapy

## 2018-01-07 DIAGNOSIS — Z515 Encounter for palliative care: Secondary | ICD-10-CM

## 2018-01-07 DIAGNOSIS — K117 Disturbances of salivary secretion: Secondary | ICD-10-CM

## 2018-01-07 LAB — BASIC METABOLIC PANEL WITH GFR
Anion gap: 11 (ref 5–15)
BUN: 29 mg/dL — ABNORMAL HIGH (ref 6–20)
CO2: 25 mmol/L (ref 22–32)
Calcium: 7.9 mg/dL — ABNORMAL LOW (ref 8.9–10.3)
Chloride: 115 mmol/L — ABNORMAL HIGH (ref 101–111)
Creatinine, Ser: 0.72 mg/dL (ref 0.61–1.24)
GFR calc Af Amer: >60 mL/min
GFR calc non Af Amer: >60 mL/min
Glucose, Bld: 151 mg/dL — ABNORMAL HIGH (ref 65–99)
Potassium: 4.5 mmol/L (ref 3.5–5.1)
Sodium: 151 mmol/L — ABNORMAL HIGH (ref 135–145)

## 2018-01-07 LAB — CULTURE, BLOOD (ROUTINE X 2)
CULTURE: NO GROWTH
CULTURE: NO GROWTH
SPECIAL REQUESTS: ADEQUATE
Special Requests: ADEQUATE

## 2018-01-07 LAB — GLUCOSE, CAPILLARY: Glucose-Capillary: 140 mg/dL — ABNORMAL HIGH (ref 65–99)

## 2018-01-07 LAB — CBC
HCT: 33.9 % — ABNORMAL LOW (ref 39.0–52.0)
Hemoglobin: 10.8 g/dL — ABNORMAL LOW (ref 13.0–17.0)
MCH: 30.8 pg (ref 26.0–34.0)
MCHC: 31.9 g/dL (ref 30.0–36.0)
MCV: 96.6 fL (ref 78.0–100.0)
Platelets: 129 10*3/uL — ABNORMAL LOW (ref 150–400)
RBC: 3.51 MIL/uL — ABNORMAL LOW (ref 4.22–5.81)
RDW: 15.8 % — ABNORMAL HIGH (ref 11.5–15.5)
WBC: 10.4 10*3/uL (ref 4.0–10.5)

## 2018-01-07 LAB — VALPROIC ACID LEVEL: Valproic Acid Lvl: 29 ug/mL — ABNORMAL LOW (ref 50.0–100.0)

## 2018-01-07 LAB — SODIUM: SODIUM: 152 mmol/L — AB (ref 135–145)

## 2018-01-07 MED ORDER — BIOTENE DRY MOUTH MT LIQD: 15.0000 mL | OROMUCOSAL | Status: DC | PRN

## 2018-01-07 MED ORDER — HALOPERIDOL 0.5 MG PO TABS
0.5000 mg | ORAL_TABLET | ORAL | Status: DC | PRN
  Filled 2018-01-07: qty 1

## 2018-01-07 MED ORDER — ONDANSETRON HCL 4 MG/2ML IJ SOLN: 4.0000 mg | Freq: Four times a day (QID) | INTRAMUSCULAR | Status: DC | PRN

## 2018-01-07 MED ORDER — MIDAZOLAM HCL 2 MG/2ML IJ SOLN
2.0000 mg | INTRAMUSCULAR | Status: DC | PRN
  Administered 2018-01-07: 2 mg via INTRAVENOUS
  Filled 2018-01-07: qty 2

## 2018-01-07 MED ORDER — HALOPERIDOL LACTATE 2 MG/ML PO CONC
0.5000 mg | ORAL | Status: DC | PRN
  Filled 2018-01-07: qty 0.3

## 2018-01-07 MED ORDER — GLYCOPYRROLATE 0.2 MG/ML IJ SOLN
0.4000 mg | INTRAMUSCULAR | Status: DC | PRN
  Administered 2018-01-07: 0.4 mg via INTRAVENOUS

## 2018-01-07 MED ORDER — HALOPERIDOL LACTATE 5 MG/ML IJ SOLN: 0.5000 mg | INTRAMUSCULAR | Status: DC | PRN

## 2018-01-07 MED ORDER — GLYCOPYRROLATE 1 MG PO TABS
1.0000 mg | ORAL_TABLET | ORAL | Status: DC | PRN
  Filled 2018-01-07: qty 1

## 2018-01-07 MED ORDER — GLYCOPYRROLATE 0.2 MG/ML IJ SOLN
0.2000 mg | INTRAMUSCULAR | Status: DC
  Administered 2018-01-07: 0.2 mg via INTRAVENOUS
  Filled 2018-01-07: qty 1

## 2018-01-07 MED ORDER — ONDANSETRON 4 MG PO TBDP: 4.0000 mg | ORAL_TABLET | Freq: Four times a day (QID) | ORAL | Status: DC | PRN

## 2018-01-07 MED ORDER — VALPROATE SODIUM 500 MG/5ML IV SOLN
500.0000 mg | Freq: Three times a day (TID) | INTRAVENOUS | Status: DC
  Filled 2018-01-07: qty 5

## 2018-01-07 MED ORDER — POLYVINYL ALCOHOL 1.4 % OP SOLN
1.0000 [drp] | Freq: Four times a day (QID) | OPHTHALMIC | Status: DC | PRN
  Filled 2018-01-07: qty 15

## 2018-01-07 MED ORDER — SODIUM CHLORIDE 0.9 % IV SOLN
1.0000 mg/h | INTRAVENOUS | Status: DC
  Administered 2018-01-07: 1 mg/h via INTRAVENOUS
  Filled 2018-01-07: qty 10

## 2018-01-07 MED ORDER — MORPHINE BOLUS VIA INFUSION
2.0000 mg | INTRAVENOUS | Status: DC | PRN
  Filled 2018-01-07: qty 2

## 2018-01-07 MED ORDER — GLYCOPYRROLATE 0.2 MG/ML IJ SOLN
0.2000 mg | INTRAMUSCULAR | Status: DC | PRN
  Filled 2018-01-07: qty 1

## 2018-01-07 MED ORDER — MORPHINE BOLUS VIA INFUSION
2.0000 mg | INTRAVENOUS | Status: DC | PRN
  Administered 2018-01-07 (×7): 4 mg via INTRAVENOUS
  Filled 2018-01-07: qty 4

## 2018-01-07 MED ORDER — FENTANYL BOLUS VIA INFUSION
25.0000 ug | INTRAVENOUS | Status: DC | PRN
  Administered 2018-01-07: 50 ug via INTRAVENOUS
  Administered 2018-01-07: 100 ug via INTRAVENOUS
  Filled 2018-01-07: qty 100

## 2018-01-07 MED ORDER — SCOPOLAMINE 1 MG/3DAYS TD PT72
1.0000 | MEDICATED_PATCH | TRANSDERMAL | Status: DC
  Administered 2018-01-07: 1.5 mg via TRANSDERMAL
  Filled 2018-01-07: qty 1

## 2018-01-07 MED ORDER — GLYCOPYRROLATE 0.2 MG/ML IJ SOLN
0.2000 mg | INTRAMUSCULAR | Status: DC | PRN
  Administered 2018-01-07: 0.2 mg via INTRAVENOUS
  Filled 2018-01-07 (×2): qty 1

## 2018-01-10 ENCOUNTER — Encounter: Payer: Medicare HMO | Admitting: Physical Medicine & Rehabilitation

## 2018-01-10 ENCOUNTER — Ambulatory Visit: Payer: Medicare HMO | Admitting: Physical Therapy

## 2018-01-11 ENCOUNTER — Ambulatory Visit: Payer: Medicare HMO

## 2018-01-11 ENCOUNTER — Encounter: Payer: Medicare HMO | Admitting: Occupational Therapy

## 2018-01-12 ENCOUNTER — Ambulatory Visit: Payer: Medicare HMO | Admitting: Physical Therapy

## 2018-01-12 ENCOUNTER — Encounter: Payer: Medicare HMO | Admitting: Occupational Therapy

## 2018-01-13 ENCOUNTER — Ambulatory Visit: Payer: Medicare HMO | Admitting: Physical Therapy

## 2018-01-13 ENCOUNTER — Encounter: Payer: Medicare HMO | Admitting: Occupational Therapy

## 2018-01-15 DIAGNOSIS — I69151 Hemiplegia and hemiparesis following nontraumatic intracerebral hemorrhage affecting right dominant side: Secondary | ICD-10-CM | POA: Diagnosis not present

## 2018-01-15 DIAGNOSIS — J449 Chronic obstructive pulmonary disease, unspecified: Secondary | ICD-10-CM | POA: Diagnosis not present

## 2018-01-17 ENCOUNTER — Encounter: Payer: Medicare HMO | Admitting: Speech Pathology

## 2018-01-17 ENCOUNTER — Encounter: Payer: Medicare HMO | Admitting: Occupational Therapy

## 2018-01-17 ENCOUNTER — Ambulatory Visit: Payer: Medicare HMO | Admitting: Physical Therapy

## 2018-01-19 ENCOUNTER — Encounter: Payer: Medicare HMO | Admitting: Occupational Therapy

## 2018-01-19 ENCOUNTER — Ambulatory Visit: Payer: Medicare HMO | Admitting: Physical Therapy

## 2018-01-20 ENCOUNTER — Encounter: Payer: Medicare HMO | Admitting: Occupational Therapy

## 2018-01-20 ENCOUNTER — Ambulatory Visit: Payer: Medicare HMO | Admitting: Physical Therapy

## 2018-01-21 ENCOUNTER — Ambulatory Visit: Payer: Medicare HMO | Admitting: Physical Therapy

## 2018-01-21 NOTE — Progress Notes (Signed)
   2018/01/10 1400  Clinical Encounter Type  Visited With Patient and family together  Visit Type Patient actively dying  Referral From Nurse  Consult/Referral To Chaplain  Stress Factors  Patient Stress Factors Health changes  Family Stress Factors Major life changes  Chaplain visited with the family and was immediately met with grief from the wife of the PT.  The daughters were in the room as well.  Chaplain prayed with the family and sang 6 songs that were some of the favorites of the PT.

## 2018-01-21 NOTE — Plan of Care (Signed)
Family asking appropriate questions about patient's condition and course of action.

## 2018-01-21 NOTE — Progress Notes (Signed)
Daily Progress Note   Patient Name: Jaime Pierce       Date: 01-24-18 DOB: Oct 06, 1946  Age: 72 y.o. MRN#: [id number] Attending Physician: Rosalin Hawking, MD  Primary Care Physician: Cassandria Anger, MD Admit Date: 12/27/2017  Reason for Consultation/Follow-up: Establishing goals of care  Subjective: Patient remains intubated, unresponsive.   Review of Systems  Unable to perform ROS: Intubated   GOC: Spent multiple hours with patient and family, including wife and two daughters. Wife tells me they have made decision to withdraw care and focus on comfort. She confirms that he would not want life prolonged by heroic measures in his current state.   Educated on transition to comfort focused care with goal of comfort and dignity at EOL. Educated on starting morphine infusion and intermittent medications for increased pain, dyspnea, agitation, secretions. Educated on EOL expectations and prognosis--may be minutes-hours after extubation or that some individuals surprise [place] and last a few days. They are worried about transferring multiple times. I explained we would take it one day at a time and he may not be stable for transfer to hospice.   Stayed at bedside with RN's and family when patient was extubated to ensure he was comfortable. Morphine infusion initiated. Continued fentanyl infusion. It took multiple morphine bolus doses to ensure comfort. Robinul given x2 and scopolamine patch placed.   Therapeutic listening as Collene Schlichter, and [employer] share stories and memories of their father including his love for fishing and spending time with his family, including 9 grandchildren. Prayed with family. Emotional/spiritual support provided.   Before I left the room, patient much more comfortable  with regular respirations and periods of apnea.     Length of Stay: 6  Current Medications: Scheduled Meds:  . chlorhexidine gluconate (MEDLINE KIT)  15 mL Mouth Rinse BID  . Chlorhexidine Gluconate Cloth  6 each Topical Daily  . mouth rinse  15 mL Mouth Rinse QID  . scopolamine  1 patch Transdermal Q72H  . sodium chloride flush  10-40 mL Intracatheter Q12H    Continuous Infusions: . fentaNYL infusion INTRAVENOUS Stopped (01/06/18 1615)  . lacosamide (VIMPAT) IV Stopped (2018/01/24 0945)  . morphine    . valproate sodium      PRN Meds: [DISCONTINUED] acetaminophen **OR** acetaminophen (TYLENOL) oral liquid 160 mg/5 mL **OR** acetaminophen, antiseptic  oral rinse, fentaNYL, glycopyrrolate **OR** glycopyrrolate **OR** glycopyrrolate, haloperidol **OR** haloperidol **OR** haloperidol lactate, midazolam, morphine, ondansetron **OR** ondansetron (ZOFRAN) IV, polyvinyl alcohol, sodium chloride flush  Physical Exam  HENT:  Head: Normocephalic and atraumatic.  Cardiovascular: Tachycardia present.  Pulmonary/Chest: No accessory muscle usage. No tachypnea. No respiratory distress. He has decreased breath sounds.  Terminal extubation  Neurological: He is unresponsive.  Responds to pain, otherwise unresponsive  Skin: Skin is warm and dry.  Nursing note and vitals reviewed.          Vital Signs: BP 116/73   Pulse 68   Temp 99.8 F (37.7 C) (Axillary)   Resp (!) 23   Ht 6' (1.829 m)   Wt 83.1 kg (183 lb 3.2 oz)   SpO2 96%   BMI 24.85 kg/m  SpO2: SpO2: 96 % O2 Device: O2 Device: ETT O2 Flow Rate: O2 Flow Rate (L/min): 6 L/min  Intake/output summary:   Intake/Output Summary (Last 24 hours) at 01/20/18 1102 Last data filed at Jan 20, 2018 0919 Gross per 24 hour  Intake 2239.13 ml  Output 1135 ml  Net 1104.13 ml   LBM: Last BM Date: 01-20-2018 Baseline Weight: Weight: 75.3 kg (166 lb 0.1 oz) Most recent weight: Weight: 83.1 kg (183 lb 3.2 oz)       Palliative Assessment/Data:  PPS: 10%    Flowsheet Rows     Most Recent Value  Intake Tab  Referral Department  Hospitalist  Unit at Time of Referral  Med/Surg Unit  Palliative Care Primary Diagnosis  Neurology  Date Notified  01/02/18  Palliative Care Type  New Palliative care  Reason for referral  Clarify Goals of Care  Date of Admission  01/09/2018  Date first seen by Palliative Care  01/02/18  # of days Palliative referral response time  0 Day(s)  # of days IP prior to Palliative referral  1  Clinical Assessment  Psychosocial & Spiritual Assessment  Palliative Care Outcomes      Patient Active Problem List   Diagnosis Date Noted  . Acute respiratory distress   . Encounter for intubation   . Fever   . History of intracranial hemorrhage   . Cytotoxic cerebral edema ([place]) 01/02/2018  . Brain herniation ([place]) 01/02/2018  . Acute respiratory failure ([place])   . Advance care planning   . Palliative care by specialist   . Goals of care, counseling/discussion   . Leukocytosis   . Intracerebral hemorrhage 12/10/2017  . Cerebral amyloid angiopathy (CODE) 12/09/2017  . Seizures ([place]) 12/09/2017  . ICH (intracerebral hemorrhage) ([place]) 12/06/2017  . Partial seizure ([place]) 11/08/2017  . Positive colorectal cancer screening using Cologuard test 10/22/2017  . Hemiparesis affecting right side as late effect of cerebrovascular accident (CVA) ([place])   . Cough   . Nontraumatic hemorrhage of left cerebral hemisphere ([place])   . Pain   . Hyponatremia   . Benign essential HTN   . Hemiparesis of right dominant side as late effect of nontraumatic intracerebral hemorrhage ([place])   . Gait disturbance, post-stroke   . Aphasia as late effect of stroke   . SAH (subarachnoid hemorrhage) ([place]) 09/02/2017  . B12 deficiency 09/02/2017  . Well adult exam 06/16/2016  . Cigarette smoker 02/23/2016  . COPD GOLD II  12/24/2014  . CAD (coronary artery disease) 07/22/2011  . HTN (hypertension) 07/22/2011  . Dyslipidemia 07/22/2011    . Smoker 07/22/2011    Palliative Care Assessment & Plan   Patient Profile: 72 y.o. male  with past  medical history of recent R parietal ICH from which he was recovering in rehab, cerebral amyloid angiopathy, L sided ICH in October, seizures, HTN, hyperlipidemia admitted on 01/03/2018 with new frontal ICH with midline shift that presented as slurred speech, choking on some water, altered mental status. Repeat CT on 2/10 showed increased blood volume and increasing L to R shift an Respiratory status decompensated requiring intubation. Concern for aspiration pneumonia. Palliative medicine consulted for [place].  Assessment/Recommendations/Plan   Terminal extubation.   Transition to comfort measures only. Discontinued labs/medications/interventions not aimed at comfort.   Symptom management  RN initiated morphine infusion. Currently on 28m/hr. RN may bolus morphine via infusion [id number] pain/dyspnea.  Versed 2mg7m q2h prn agitation  Scopolamine patch  Robinul 0.2mg 77mscheduled q4h. RN may give Robinul 0.4mg I21m4h prn secretions.  Spiritual consult for EOL/prayer.  Prognosis likely hours. Anticipate hospital death.   PMT will continue to follow and support family.   Code Status:  DNR  Prognosis:  Hours-days  Discharge Planning:  Anticipated Hospital Death  Care plan was discussed with wife, two daughters, Dr. Xu, RNErlinda HongThank you for allowing the Palliative Medicine Team to assist in the care of this patient.   Time In: 1015 Time Out: 1245 Total Time 2.5 hours Prolonged Time Billed yes      Greater than 50%  of this time was spent counseling and coordinating care related to the above assessment and plan.  Frederik Standley Ihor DowC Palliative Medicine Team  Phone: [phone] contact Palliative Medicine Team phone at [phone] and concerns.

## 2018-01-21 NOTE — Progress Notes (Signed)
Nutrition Brief Note  Chart reviewed. Pt now transitioning to comfort care.  No further nutrition interventions warranted at this time.  Please re-consult as needed.   Chao Blazejewski RD, LDN, CNSC [phone] Pager [phone] After Hours Pager    

## 2018-01-21 NOTE — Progress Notes (Signed)
PULMONARY / CRITICAL CARE MEDICINE   Name: Jaime Pierce MRN: [id number] DOB: 03/20/1946    ADMISSION DATE:  01/15/2018 CONSULTATION DATE:  01/10/2018  REFERRING MD:  Dr. Leonie Man, Neurology  CHIEF COMPLAINT:  Altered mental status.  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  He developed progressive Lt sided weakness and unable to speak.  He was found to have new Rt frontal ICH with 5 mm shift.  He required intubation for airway protection. PMHx of HTN, HLD, CAD s/p CABG.  2/15:;I spoke with Dr. Erlinda Hong. He has spokeen with the family and they have decided to go with a terminal wean today  SUBJECTIVE:   No sig change.  No sig change on CT head.  Palliative care met with family yesterday, they are lilkely not ready to make decisions regarding withdrawal. Dr Erlinda Hong met with family today.  VITAL SIGNS: BP 116/73   Pulse 68   Temp 99.8 F (37.7 C) (Axillary)   Resp (!) 23   Ht 6' (1.829 m)   Wt 183 lb 3.2 oz (83.1 kg)   SpO2 96%   BMI 24.85 kg/m   VENTILATOR SETTINGS: Vent Mode: PRVC FiO2 (%):  [40 %] 40 % Set Rate:  [14 bmp] 14 bmp Vt Set:  [550 mL] 550 mL PEEP:  [5 cmH20] 5 cmH20 Pressure Support:  [8 cmH20] 8 cmH20 Plateau Pressure:  [12 cmH20-14 cmH20] 14 cmH20  INTAKE / OUTPUT: I/O last 3 completed shifts: In: 3922.9 [I.V.:1085.4; NG/GT:2340; IV Piggyback:497.5] Out: 2360 [Urine:2185; Stool:175]  PHYSICAL EXAMINATION: Gen:      No acute distress HEENT:  EOMI, sclera anicteric, ETT in place Neck:     No masses; no thyromegaly Lungs:    Clear to auscultation bilaterally; normal respiratory effort CV:         Regular rate and rhythm; no murmurs Abd:      + bowel sounds; soft, non-tender; no palpable masses, no distension Ext:    No edema; adequate peripheral perfusion Skin:      Warm and dry; no rash Neuro: alert and oriented x  3 Psych: normal mood and affect  LABS:  BMET Recent Labs  Lab 01/05/18 0438  01/06/18 0408  01/06/18 1751 01/20/2018 0020 January 20, 2018 0601  NA 156*   < > 155*   < > 153* 152* 151*  K 3.2*  --  3.2*  --   --   --  4.5  CL 120*  --  118*  --   --   --  115*  CO2 24  --  26  --   --   --  25  BUN 27*  --  27*  --   --   --  29*  CREATININE 0.75  --  0.71  --   --   --  0.72  GLUCOSE 155*  --  145*  --   --   --  151*   < > = values in this interval not displayed.    Electrolytes Recent Labs  Lab 01/02/18 1313 01/03/18 4235 01/03/18 1753 01/04/18 0447 01/05/18 0438 01/06/18 0408 Jan 20, 2018 0601  CALCIUM  --  8.7*  --  8.5* 8.3* 8.1* 7.9*  MG 1.8 2.0 2.1 1.8 2.0 2.0  --   PHOS 2.7 2.7 1.9*  --   --   --   --  CBC Recent Labs  Lab 01/05/18 0438 01/06/18 0408 January 27, 2018 0601  WBC 13.2* 10.7* 10.4  HGB 10.7* 10.6* 10.8*  HCT 34.3* 33.8* 33.9*  PLT 155 155 129*    Coag's Recent Labs  Lab 12/31/2017 0720 01/04/18 0447  APTT 32  --   INR 1.03 1.34    Sepsis Markers Recent Labs  Lab 01/02/18 0727 01/03/18 0614 01/03/18 2049 01/04/18 0447 01/05/18 0752  LATICACIDVEN 2.1* 2.2* 2.1*  --   --   PROCALCITON  --  <0.10  --  <0.10 0.26    ABG Recent Labs  Lab 01/04/18 0355 01/05/18 0440 01/06/18 0344  PHART 7.450 7.487* 7.495*  PCO2ART 35.0 32.8 34.6  PO2ART 81.0* 71.0* 86.0    Liver Enzymes Recent Labs  Lab 12/24/2017 0720 01/04/18 0447  AST 19 17  ALT 18 13*  ALKPHOS 50 34*  BILITOT 0.8 0.6  ALBUMIN 3.2* 2.3*    Cardiac Enzymes No results for input(s): TROPONINI, PROBNP in the last 168 hours.  Glucose Recent Labs  Lab 01/06/18 0848 01/06/18 1130 01/06/18 1539 01/06/18 2005 01/06/18 2335 01-27-18 0339  GLUCAP 150* 140* 113* 113* 108* 140*    Imaging No results found.   STUDIES:  CT head 2/09 >> Rt lateral frontal hemorrhage 54 cc, edema and mass effect with 5 mm Rt to Lt shift, small volume SAH CT head 2/10 >> Increased Rt lateral  frontal hemorrhage, 86 cc, 6 mm Rt to Lt shift CT head 2/13>>> 1. Unchanged right frontal lobe hematoma. Unchanged edema and mass effect with 6 mm of leftward midline shift. 2. Unchanged small volume subarachnoid and intraventricular hemorrhage.  CULTURES: Blood 2/10 >>   ANTIBIOTICS: Zosyn 2/10 >>   SIGNIFICANT EVENTS: 2/09 Transfer from rehab to ICU 2/10 place CVL, increase 3% saline  LINES/TUBES: ETT 2/09 >>  Lt IJ CVL 2/10 >>   DISCUSSION: 72 yo with recurrent ICH with cerebral edema and midline shift complicated by compromised airway and aspiration pneumonia. Partial seizures after bleed. AED's were increased but mental status has not improved with control of seizures.  There family has decided ton terminally wean the patient. Dr. Erlinda Hong had seen them and arranged the necesary order  ASSESSMENT / PLAN:  Recurrent ICH with cerebral edema. Hx of seizures. Now controlled by EEG but mental status no better. No sig change on repeat head CT  PLAN -  Poor prognosis  Per neuro  Ongoing goals of care discussion with family   Hypertension. Hx of CAD. - goal SBP < 140  Acute respiratory failure with compromised airway. Vent support - 8cc/kg  F/u CXR  F/u ABG Terminal wean today  Suspected aspiration pneumonia. Mixed flora on gram stain. No staph in culture. D/C Vanco - continue zosyn  DVT prophylaxis - SCDs SUP - Protonix Nutrition - tube feeds Goals of care - full code  The patient is critically ill with multiple organ system failure and requires high complexity decision making for assessment and support, frequent evaluation and titration of therapies, advanced monitoring, review of radiographic studies and interpretation of complex data.   Critical Care Time devoted to patient care services, exclusive of separately billable procedures, described in this note is 35 minutes.   Robert Rosneblatt [place] [facility] >Criticalcare

## 2018-01-21 NOTE — Progress Notes (Signed)
RT at bedside to draw morning ABG.  Patient's wife stated did not want ABG to be drawn this morning.  RN is aware is going to make MD aware of wife's decision.

## 2018-01-21 NOTE — Death Summary Note (Signed)
Stroke Discharge Summary  Patient ID: Jaime Pierce   MRN: [id number]      DOB: 09/28/1946  Date of Admission: 12/30/2017 Date of Discharge: 17-Jan-2018  Attending Physician:  Jaime Plan, MD, Stroke MD Consultant(s):   Treatment Team:  Pccm, Md, MD palliative care  Patient's PCP:  Tresa Garter, MD  DISCHARGE DIAGNOSIS:  Recurrent bilateral ICH Cerebral amyloid angiopathy  Active Problems:   Cytotoxic cerebral edema (HCC)   Acute respiratory failure (HCC)   Palliative care by specialist   Encounter for intubation   Fever   History of intracranial hemorrhage   Increased oropharyngeal secretions   Terminal care   Past Medical History:  Diagnosis Date  . Coronary artery disease   . Dyspnea on exertion   . Hemorrhagic stroke (HCC)   . Hyperlipidemia   . Hypertension   . Seizures (HCC)   . Stroke (HCC)   . Tobacco abuse    Past Surgical History:  Procedure Laterality Date  . CORONARY ARTERY BYPASS GRAFT      Allergies as of Jan 17, 2018      Reactions   Other Dermatitis, Rash   Patient MUST have "HYPOALLERGENIC" SHEETS washed in SCENT-FREE DETERGENT!!! Wife showed me pics on her phone of what develops if he is placed on regular sheets and the patient develops LARGE, "SCALDED" areas that spread!!   Sulfonamide Derivatives Other (See Comments)   Reaction not recalled by wife     LABORATORY STUDIES CBC    Component Value Date/Time   WBC 10.4 17-Jan-2018 0601   RBC 3.51 (L) 01-17-2018 0601   HGB 10.8 (L) 01-17-2018 0601   HCT 33.9 (L) 2018/01/17 0601   PLT 129 (L) 01-17-18 0601   MCV 96.6 January 17, 2018 0601   MCH 30.8 2018/01/17 0601   MCHC 31.9 January 17, 2018 0601   RDW 15.8 (H) 2018/01/17 0601   LYMPHSABS 1.9 01/06/2018 0408   MONOABS 1.1 (H) 01/06/2018 0408   EOSABS 0.0 01/06/2018 0408   BASOSABS 0.1 01/06/2018 0408   CMP    Component Value Date/Time   NA 151 (H) 01-17-18 0601   K 4.5 January 17, 2018 0601   CL 115 (H) 01-17-2018 0601   CO2 25  2018-01-17 0601   GLUCOSE 151 (H) 01/17/2018 0601   GLUCOSE 113 (H) 09/21/2006 0832   BUN 29 (H) January 17, 2018 0601   CREATININE 0.72 January 17, 2018 0601   CALCIUM 7.9 (L) January 17, 2018 0601   PROT 5.2 (L) 01/04/2018 0447   ALBUMIN 2.3 (L) 01/04/2018 0447   AST 17 01/04/2018 0447   ALT 13 (L) 01/04/2018 0447   ALKPHOS 34 (L) 01/04/2018 0447   BILITOT 0.6 01/04/2018 0447   GFRNONAA >60 01-17-18 0601   GFRAA >60 01/17/18 0601   COAGS Lab Results  Component Value Date   INR 1.34 01/04/2018   INR 1.03 01/10/2018   INR 0.98 12/06/2017   Lipid Panel    Component Value Date/Time   CHOL 173 01/15/2018 0720   TRIG 58 12/31/2017 0720   TRIG 65 09/21/2006 0832   HDL 32 (L) 12/25/2017 0720   CHOLHDL 5.4 01/14/2018 0720   VLDL 12 01/11/2018 0720   LDLCALC 129 (H) 12/24/2017 0720   HgbA1C  Lab Results  Component Value Date   HGBA1C 5.9 (H) 09/02/2017   Urinalysis    Component Value Date/Time   COLORURINE YELLOW 01/03/2018 1158   APPEARANCEUR HAZY (A) 01/03/2018 1158   LABSPEC 1.021 01/03/2018 1158   PHURINE 5.0 01/03/2018 1158   GLUCOSEU  NEGATIVE 01/03/2018 1158   GLUCOSEU NEGATIVE 07/01/2017 0938   HGBUR SMALL (A) 01/03/2018 1158   BILIRUBINUR NEGATIVE 01/03/2018 1158   KETONESUR NEGATIVE 01/03/2018 1158   PROTEINUR NEGATIVE 01/03/2018 1158   UROBILINOGEN 0.2 07/01/2017 0938   NITRITE NEGATIVE 01/03/2018 1158   LEUKOCYTESUR NEGATIVE 01/03/2018 1158   Urine Drug Screen     Component Value Date/Time   LABOPIA NONE DETECTED 12/06/2017 1506   COCAINSCRNUR NONE DETECTED 12/06/2017 1506   LABBENZ NONE DETECTED 12/06/2017 1506   AMPHETMU NONE DETECTED 12/06/2017 1506   THCU NONE DETECTED 12/06/2017 1506   LABBARB NONE DETECTED 12/06/2017 1506    Alcohol Level    Component Value Date/Time   ETH <10 12/06/2017 0950     SIGNIFICANT DIAGNOSTIC STUDIES I have personally reviewed the radiological images below and agree with the radiology interpretations.  Ct Head Wo  Contrast 2018/01/27 IMPRESSION: 1. Progressive large right frontal hematoma now estimated at 80 cc volume. There is new right lateral ventricular extension and increased local subarachnoid blood. Midline shift measures 6 mm. No hydrocephalus. 2. Sequela of prior lobar hemorrhages in the bilateral parietal lobe. 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 Suspect amyloid angiopathy.  Ct Head Wo Contrast 01-27-2018 IMPRESSION:  1. Right lateral frontal lobe acute hemorrhage measuring up to 5.2 cm, 54 cc. Associated edema and mass effect with partial effacement of right lateral ventricle and 5 mm right-to-left midline shift. No herniation.  2. Small volume subarachnoid hemorrhage over right cerebral convexity.  3. Interval dispersion of right parietal and right temporal hematoma with residual encephalomalacia.  4. Stable chronic left parietal infarction. Stable chronic microvascular ischemic changes and parenchymal volume loss of the brain.   Dg Chest Port 1 View 01/03/2018  IMPRESSION: Stable support apparatus and bibasilar atelectasis.   LTM EEG 01/04/18 This EEG is indicative of a focal area of structural abnormality and cortical dysfunction in the right hemisphere. Additionally, there is a moderate diffuse encephalopathy, judging by the best rhythms achieved by the left hemisphere. Sedating medication effect cannot be excluded.  No epileptiform discharges or seizures are seen. Sevenevents are marked on the recording, butnoclinical semiology is apparent on video, and there is no EEG seizure pattern associated with these events.  LTM EEG 01/05/18 This EEG is indicative of a focal area of structural abnormality and cortical dysfunction in the right hemisphere. Additionally, there is a moderate diffuse encephalopathy, judging by the best rhythms achieved by the left hemisphere. Sedating medication effect cannot be excluded.  No epileptiform discharges or seizures are  seen. Threeevents are marked on the recording, but no clinical semiology is apparent on video. They all occur during more awake EEG segments, butthere is no EEG seizure pattern associated with these events.  Ct Head Wo Contrast 01/05/2018 IMPRESSION: 1. Unchanged right frontal lobe hematoma. Unchanged edema and mass effect with 6 mm of leftward midline shift. 2. Unchanged small volume subarachnoid and intraventricular hemorrhage.   Dg Chest Port 1 View 01/05/2018 IMPRESSION: Tube and catheter positions as described without pneumothorax. Bibasilar atelectasis with small pleural effusions bilaterally. Stable cardiac silhouette. There is aortic atherosclerosis. Aortic Atherosclerosis (ICD10-I70.0).   HISTORY OF PRESENT ILLNESS CC: Acute worsening of left-sided weakness  History is obtained from: Chart, patient's wife at bedside  HPI: Jaime Pierce is a 72 y.o. male past medical history of a recent right parietal ICH likely secondary to cerebral amyloid angiopathy, prior left-sided ICH in October, seizures probably secondary to the ICH, hypertension, hyperlipidemia, who was in the rehabilitation  unit recovering from his right parietal ICH, was noted to have worsening of his left-sided weakness and drooling from the left side risk of mild by his wife. There is no clear last known normal but most likely he was at his new baseline normal up until 6 or 7 PM yesterday. His wife was in the room with him and slept in that room.  She woke up in the night, and in an attempt to speak with her could not really make sense of his speech as it was very slurred.  She said he was able to stand with great difficulty to use a urinal, which was different than what he had been doing in the prior few days.  He also attempted to drink some water and seemed like he choked on it. He has residual right hemiparesis from the left-sided ICH in October with increased tone in his right arm and right leg for which Botox  treatment was being considered. He was recovering from this recent right parietal ICH that happened in middle of January 2019 and regaining some strength on his left side with physical and occupational therapy in the rehabilitation unit. I was called by the rapid response team who were called because of this acute neuro change. I requested that the patient get a stat noncontrast CT of the head as I made my way to evaluate him. The noncontrast CT of the head was performed and shows a new right frontal bleed about 55 cc with some subarachnoid blood and 5 mm midline shift. I have made a decision to emergently move him to the neurological ICU under the stroke service.  Of note, patient is not on aspirin or heparin. Labs-recent labs are not available to review as the most recent labs were done about a few days ago. He is not on any antihypertensive medication and his blood pressures have been fluctuating from systolics in the 80s and 90s to a few readings of systolics in the 140s and 150s.   LKW: 6 PM on 12/31/2017 tpa given?: no, ICH Premorbid modified Rankin scale (mRS): 4  HOSPITAL COURSE Jaime Pierce is a 72 y.o. male with history of previous ICH felt secondary to cerebral amyloid angiopathy, hyperlipidemia, hypertension, seizures, previous tobacco use, and coronary artery disease presenting with slurred speech. He did not receive IV t-PA due to ICH.  Right frontal lobe large acute ICH with IVH - secondary to cerebral amyloid angiopathy with hematoma expansion and increasing cytotoxic edema and right to left midline shift  Resultant  right gaze deviation and left hemiplegia   F/U CT head 01/02/18 - Progressive large right frontal hematoma now estimated at 86 cc volume  CT repeat 01/05/18 no significant change  VTE prophylaxis - SCDs  Diet NPO time specified  Fall precautions  No antithrombotic prior to admission, now on No antithrombotic  Disposition: After long discussion  of poor prognosis, family decided comfort care measures only.  had one-way extubation and pt passed away at 1448.   Respiratory failure  Intubated for airway protection  CCM on board  off fentanyl  Mental status worsened  Family decided comfort care measures only. Had one way extubation and he passed away at 1448  Cerebral edema  Right large frontal ICH with IVH  With midline shift  3% saline on hold  On NS @ 30cc  Na 142->158->157->156->155->152  Repeat CT head 01/05/18 no significant change  Hx of ICH due to CAA  08/2017 left parietal large ICH,  and small right frontal SAH- CTA head and neck unremarkable - EF 50-55%, LDL 81 and A1C 5.9 - send to CIR  MRI 11/21/17 left occipital small subacute ICH  12/06/17 left frontal and left temporal ICH with left UE weakness  Simplepartial seizure  10/23/17 - right facial twitching - partial seizure - started on keppra  11/10/17 - outpt saw Dr. Pearlean BrownieSethi - EEG slowing no seizure - keppra changed to depakote due to intolerance  Change depakote to 500mg  bid 12/07/2017  12/09/17 - episode of left facial twitching with post ictal and todd's paralysis - vimpat load and then vimpat 100mg  bid  01/03/18 - RUE partial rhythmic movement - loaded with depakote and resumed vimpat  LTM EEG no seizure but slow bilaterally x 2 days  depakote level 48->46->26   Increase Depakote to 500mg  Q8 and continue Vimpat 100mg  bid.   Fever   Tmax 101.4->100.7->101.4->101.1  On zosyn  Blood culture NGTD  UA WBC 6-30  CXR unremarkable x 2  Other Stroke Risk Factors  Advanced age  Former cigarette smoker - quit  ETOH use, advised to drink no more than 1 drink per day.  Family hx stroke (mother)  Coronary artery disease  Other Active Problems  Leukocytosis WBC 14.1->13.2->10.7->10.4    DISCHARGE EXAM Pt deceased.   Jaime PlanJindong Deejay Koppelman, MD PhD Stroke Neurology 11/13/18 6:21 PM

## 2018-01-21 NOTE — Procedures (Signed)
Extubation Procedure Note  Patient Details:   Name: Jaime Pierce DOB: [birth year] MRN: [id number]   Airway Documentation:     Evaluation  O2 sats: currently acceptable Complications: No apparent complications Patient did tolerate procedure well. Bilateral Breath Sounds: Diminished   No   Patient extubated to room air per withdrawal of life protocol.   Carolan ShiverKelley, Jaime Pierce 01/11/2018, 11:17 AM

## 2018-01-21 NOTE — Progress Notes (Signed)
STROKE TEAM PROGRESS NOTE   SUBJECTIVE (INTERVAL HISTORY) Daughters and wife are at bedside. Pt mental status getting worse, not response to voice. On pain stimulation, BUE posturing, BLE mild withdraw. Depakote level was low at 26. Family decided comfort care measures only.    OBJECTIVE Temp:  [98.8 F (37.1 C)-100.2 F (37.9 C)] 99.8 F (37.7 C) (02/15 0800) Pulse Rate:  [64-91] 91 (02/15 1133) Cardiac Rhythm: Normal sinus rhythm (02/15 0800) Resp:  [19-42] 21 (02/15 1133) BP: (115-174)/(62-99) 132/67 (02/15 1100) SpO2:  [65 %-100 %] 65 % (02/15 1133) FiO2 (%):  [40 %] 40 % (02/15 0741) Weight:  [183 lb 3.2 oz (83.1 kg)] 183 lb 3.2 oz (83.1 kg) (02/15 0500)  CBC:  Recent Labs  Lab 01/05/18 0438 01/06/18 0408 01/09/2018 0601  WBC 13.2* 10.7* 10.4  NEUTROABS 9.8* 7.6  --   HGB 10.7* 10.6* 10.8*  HCT 34.3* 33.8* 33.9*  MCV 95.3 95.5 96.6  PLT 155 155 129*    Basic Metabolic Panel:  Recent Labs  Lab 01/03/18 0614  01/03/18 1753  01/05/18 0438  01/06/18 0408  01/19/2018 0020 01/01/2018 0601  NA 158*  157*   < > 159*   < > 156*   < > 155*   < > 152* 151*  K 2.9*  --   --    < > 3.2*  --  3.2*  --   --  4.5  CL 126*  --   --    < > 120*  --  118*  --   --  115*  CO2 21*  --   --    < > 24  --  26  --   --  25  GLUCOSE 137*  --   --    < > 155*  --  145*  --   --  151*  BUN 20  --   --    < > 27*  --  27*  --   --  29*  CREATININE 0.78  --   --    < > 0.75  --  0.71  --   --  0.72  CALCIUM 8.7*  --   --    < > 8.3*  --  8.1*  --   --  7.9*  MG 2.0  --  2.1   < > 2.0  --  2.0  --   --   --   PHOS 2.7  --  1.9*  --   --   --   --   --   --   --    < > = values in this interval not displayed.    Lipid Panel:     Component Value Date/Time   CHOL 173 2018/01/26 0720   TRIG 58 01/26/2018 0720   TRIG 65 09/21/2006 0832   HDL 32 (L) 2018/01/26 0720   CHOLHDL 5.4 01-26-18 0720   VLDL 12 2018/01/26 0720   LDLCALC 129 (H) Jan 26, 2018 0720   HgbA1c:  Lab Results   Component Value Date   HGBA1C 5.9 (H) 09/02/2017   Urine Drug Screen:     Component Value Date/Time   LABOPIA NONE DETECTED 12/06/2017 1506   COCAINSCRNUR NONE DETECTED 12/06/2017 1506   LABBENZ NONE DETECTED 12/06/2017 1506   AMPHETMU NONE DETECTED 12/06/2017 1506   THCU NONE DETECTED 12/06/2017 1506   LABBARB NONE DETECTED 12/06/2017 1506    Alcohol Level     Component Value Date/Time  ETH <10 12/06/2017 0950    IMAGING I have personally reviewed the radiological images below and agree with the radiology interpretations.  Ct Head Wo Contrast Jan 19, 2018 IMPRESSION: 1. Progressive large right frontal hematoma now estimated at 80 cc volume. There is new right lateral ventricular extension and increased local subarachnoid blood. Midline shift measures 6 mm. No hydrocephalus. 2. Sequela of prior lobar hemorrhages in the bilateral parietal lobe. 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 Suspect amyloid angiopathy.  Ct Head Wo Contrast 2018-01-19 IMPRESSION:  1. Right lateral frontal lobe acute hemorrhage measuring up to 5.2 cm, 54 cc. Associated edema and mass effect with partial effacement of right lateral ventricle and 5 mm right-to-left midline shift. No herniation.  2. Small volume subarachnoid hemorrhage over right cerebral convexity.  3. Interval dispersion of right parietal and right temporal hematoma with residual encephalomalacia.  4. Stable chronic left parietal infarction. Stable chronic microvascular ischemic changes and parenchymal volume loss of the brain.   Dg Chest Port 1 View 01/03/2018  IMPRESSION: Stable support apparatus and bibasilar atelectasis.   LTM EEG 01/04/18 This EEG is indicative of a focal area of structural abnormality and cortical dysfunction in the right hemisphere. Additionally, there is a moderate diffuse encephalopathy, judging by the best rhythms achieved by the left hemisphere. Sedating medication effect cannot be excluded.  No  epileptiform discharges or seizures are seen.  Seven events are marked on the recording, but no clinical semiology is apparent on video, and there is no EEG seizure pattern associated with these events.   LTM EEG 01/05/18 This EEG is indicative of a focal area of structural abnormality and cortical dysfunction in the right hemisphere. Additionally, there is a moderate diffuse encephalopathy, judging by the best rhythms achieved by the left hemisphere. Sedating medication effect cannot be excluded.  No epileptiform discharges or seizures are seen.  Three events are marked on the recording, but no clinical semiology is apparent on video. They all occur during more awake EEG segments, but there is no EEG seizure pattern associated with these events.   Ct Head Wo Contrast 01/05/2018 IMPRESSION: 1. Unchanged right frontal lobe hematoma. Unchanged edema and mass effect with 6 mm of leftward midline shift. 2. Unchanged small volume subarachnoid and intraventricular hemorrhage.   Dg Chest Port 1 View 01/05/2018 IMPRESSION: Tube and catheter positions as described without pneumothorax. Bibasilar atelectasis with small pleural effusions bilaterally. Stable cardiac silhouette. There is aortic atherosclerosis. Aortic Atherosclerosis (ICD10-I70.0).    PHYSICAL EXAM Vitals:   01/11/2018 1000 01/12/2018 1100 01/13/2018 1115 01/10/2018 1133  BP: 116/73 132/67    Pulse: 68 69  91  Resp: (!) 23 (!) 25  (!) 21  Temp:      TempSrc:      SpO2: 96% 98% 100% (!) 65%  Weight:      Height:       Frail elderly caucasian male not in distress. He is intubated. Head is nontraumatic. Neck is supple without bruit.    Cardiac exam no murmur or gallop. Lungs are clear to auscultation. Distal pulses are well felt.  Neurological Exam :  Intubated and off sedation. Eyes closed, not respond to voice, not following any commands. With pain stimulation, pt is not open eyes, on forced eyelid opening, right gaze, no tracking and PERRL.  Fundi not visualized. Not blinking to visual threat bilaterally. Left lower facial weakness. Tongue midline inside mouth. Left corneal reflex diminished, right corneal brisk. gag positive.  On pain stimulation, bilateral upper extremity posturing  and bilateral lower extremity mild withdrawal.  All deep tendon reflexes are brisker on the right than the left. Bilateral babinski positive bilaterally. Sensation, coordination and gait not tested.  ASSESSMENT/PLAN Jaime Pierce is a 72 y.o. male with history of previous ICH felt secondary to cerebral amyloid angiopathy, hyperlipidemia, hypertension, seizures, previous tobacco use, and coronary artery disease presenting with slurred speech. He did not receive IV t-PA due to ICH.  Right frontal lobe large acute ICH with IVH - secondary to cerebral amyloid angiopathy with hematoma expansion and increasing cytotoxic edema and right to left midline shift  Resultant  right gaze deviation and left hemiplegia   F/U CT head 01/02/18 - Progressive large right frontal hematoma now estimated at 86 cc volume  CT repeat 01/05/18 no significant change  VTE prophylaxis - SCDs Diet NPO time specified Fall precautions  No antithrombotic prior to admission, now on No antithrombotic  Disposition: After long discussion of poor prognosis, family decided comfort care measures only.  Will do one-way extubation.  Respiratory failure  Intubated for airway protection  CCM on board  off fentanyl  Mental status worsened  Family decided comfort care measures only, will do one-way extubation  Cerebral edema  Right large frontal ICH with IVH  With midline shift  3% saline on hold  On NS @ 30cc  Na 142->158->157->156->155->152  Repeat CT head 01/05/18 no significant change  Hx of ICH due to CAA  08/2017 left parietal large ICH, and small right frontal SAH - CTA head and neck unremarkable - EF 50-55%, LDL 81 and A1C 5.9 - send to CIR   MRI 11/21/17  left occipital small subacute ICH  12/06/17 left frontal and left temporal ICH with left UE weakness  Simple partial seizure  10/23/17 - right facial twitching - partial seizure - started on keppra  11/10/17 - outpt saw Dr. Pearlean Brownie - EEG slowing no seizure - keppra changed to depakote due to intolerance  Change depakote to 500mg  bid 12/07/2017  12/09/17 - episode of left facial twitching with post ictal and todd's paralysis - vimpat load and then vimpat 100mg  bid  01/03/18 - RUE partial rhythmic movement - loaded with depakote and resumed vimpat  LTM EEG no seizure but slow bilaterally x 2 days  depakote level 48->46->26   Increase Depakote to 500mg  Q8 and continue Vimpat 100mg  bid.   Fever   Tmax 101.4->100.7->101.4->101.1  On zosyn  Blood culture NGTD  UA WBC 6-30  CXR unremarkable x 2  Other Stroke Risk Factors  Advanced age  Former cigarette smoker - quit  ETOH use, advised to drink no more than 1 drink per day.  Family hx stroke (mother)  Coronary artery disease  Other Active Problems  Leukocytosis WBC 14.1->13.2->10.7->10.4   Hospital day # 6  This patient is critically ill and at significant risk of neurological worsening, death and care requires constant monitoring of vital signs, hemodynamics,respiratory and cardiac monitoring, extensive review of multiple databases, frequent neurological assessment, discussion with family, other specialists and medical decision making of high complexity.I have made any additions or clarifications directly to the above note. I had long discussion with wife and daughters at bedside, updated pt current condition, treatment plan and poor prognosis. They expressed understanding and appreciation.  Family requested comfort care measures only.  Will do one-way extubation.  I spent 45 minutes of neurocritical care time  in the care of  this patient.   Marvel Plan, MD PhD Stroke Neurology 01-26-2018 1:23 PM  To contact Stroke  Continuity provider, please refer to [email or web address]. After hours, contact General Neurology

## 2018-01-21 NOTE — Progress Notes (Signed)
200ml morphine wasted in sink w/ Henderson BaltimoreEmily Renn, RN. 200ml fentanyl wasted in sink w/ Henderson BaltimoreEmily Renn, RN.

## 2018-01-21 DEATH — deceased

## 2018-01-24 ENCOUNTER — Encounter: Payer: Medicare HMO | Admitting: Occupational Therapy

## 2018-01-24 ENCOUNTER — Encounter: Payer: Medicare HMO | Admitting: Speech Pathology

## 2018-01-24 ENCOUNTER — Ambulatory Visit: Payer: Medicare HMO | Admitting: Physical Therapy

## 2018-01-26 ENCOUNTER — Ambulatory Visit: Payer: Medicare HMO | Admitting: Physical Therapy

## 2018-01-26 ENCOUNTER — Encounter: Payer: Medicare HMO | Admitting: Occupational Therapy

## 2018-01-27 ENCOUNTER — Encounter: Payer: Medicare HMO | Admitting: Occupational Therapy

## 2018-01-27 ENCOUNTER — Ambulatory Visit: Payer: Medicare HMO | Admitting: Physical Therapy

## 2018-01-28 ENCOUNTER — Ambulatory Visit: Payer: Medicare HMO | Admitting: Physical Therapy

## 2018-01-31 ENCOUNTER — Ambulatory Visit: Payer: Medicare HMO | Admitting: Physical Therapy

## 2018-01-31 ENCOUNTER — Encounter: Payer: Medicare HMO | Admitting: Occupational Therapy

## 2018-01-31 ENCOUNTER — Encounter: Payer: Medicare HMO | Admitting: Speech Pathology

## 2018-02-01 ENCOUNTER — Ambulatory Visit: Payer: Medicare HMO | Admitting: Physical Therapy

## 2018-02-01 ENCOUNTER — Ambulatory Visit: Payer: Medicare HMO | Admitting: Neurology

## 2018-02-01 ENCOUNTER — Encounter: Payer: Medicare HMO | Admitting: Occupational Therapy

## 2018-02-02 ENCOUNTER — Encounter: Payer: Medicare HMO | Admitting: Occupational Therapy

## 2018-02-02 ENCOUNTER — Ambulatory Visit: Payer: Medicare HMO | Admitting: Physical Therapy

## 2018-02-04 ENCOUNTER — Ambulatory Visit: Payer: Medicare HMO | Admitting: Physical Therapy

## 2018-02-07 ENCOUNTER — Ambulatory Visit: Payer: Medicare HMO | Admitting: Physical Therapy

## 2018-02-07 ENCOUNTER — Encounter: Payer: Medicare HMO | Admitting: Occupational Therapy

## 2018-02-07 ENCOUNTER — Encounter: Payer: Medicare HMO | Admitting: Speech Pathology

## 2018-02-08 ENCOUNTER — Encounter: Payer: Medicare HMO | Admitting: Occupational Therapy

## 2018-02-08 ENCOUNTER — Ambulatory Visit: Payer: Medicare HMO | Admitting: Physical Therapy

## 2018-02-09 ENCOUNTER — Encounter: Payer: Medicare HMO | Admitting: Occupational Therapy

## 2018-02-09 ENCOUNTER — Ambulatory Visit: Payer: Medicare HMO | Admitting: Physical Therapy

## 2018-02-11 ENCOUNTER — Ambulatory Visit: Payer: Medicare HMO | Admitting: Physical Therapy

## 2018-02-17 ENCOUNTER — Ambulatory Visit: Payer: Self-pay | Admitting: Neurology

## 2018-03-03 ENCOUNTER — Ambulatory Visit: Payer: Medicare HMO | Admitting: Internal Medicine

## 2018-07-01 ENCOUNTER — Encounter: Payer: Medicare HMO | Admitting: Internal Medicine

## 2018-08-28 IMAGING — CT CT HEAD W/O CM
3 series · 14 of 47 positions shown, 16 images · non-contrast
Comparison: CT HEAD December 07, 2016

CLINICAL DATA: Altered level of consciousness. History of
hemorrhagic stroke, hypertension, hyperlipidemia and seizures.

EXAM:
CT HEAD WITHOUT CONTRAST
TECHNIQUE: Contiguous axial images were obtained from the base of the skull
through the vertex without intravenous contrast.

[Series 3: head 5.0 h30s · axial · 0.45mm/px · z∈[-74,+66]mm · 8 of 34 slices shown, 10 images]
[im 3/34  brain]
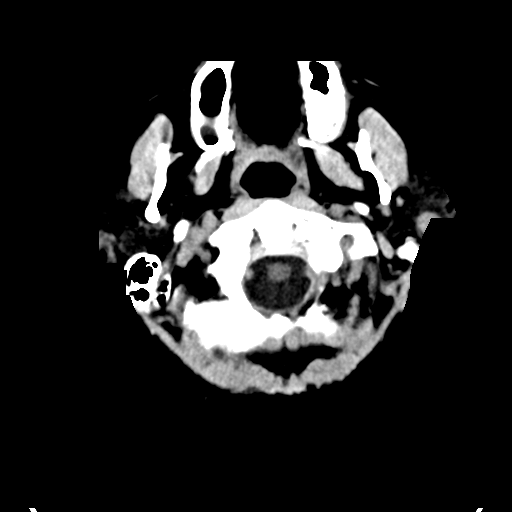
[im 3/34  bone]
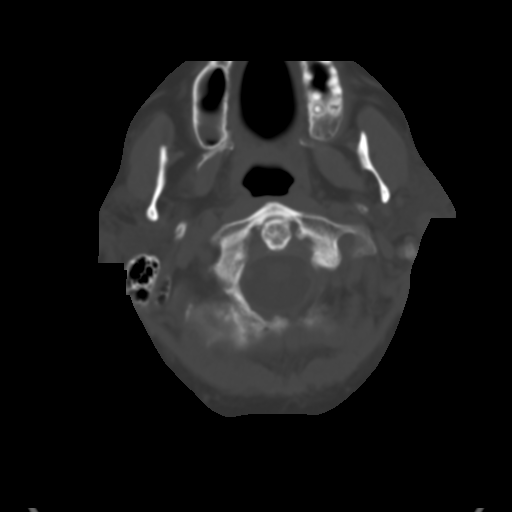
[im 7/34  brain]
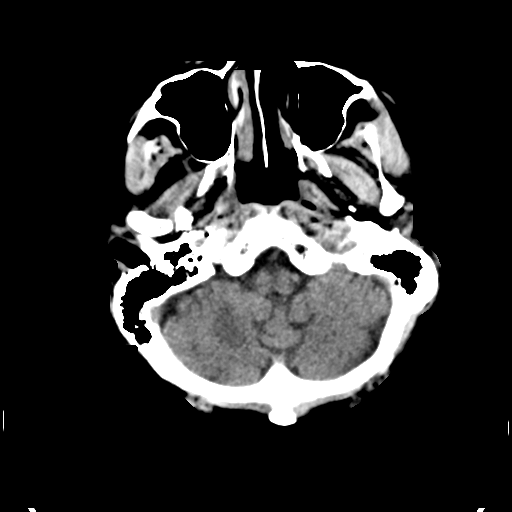
[im 11/34  brain]
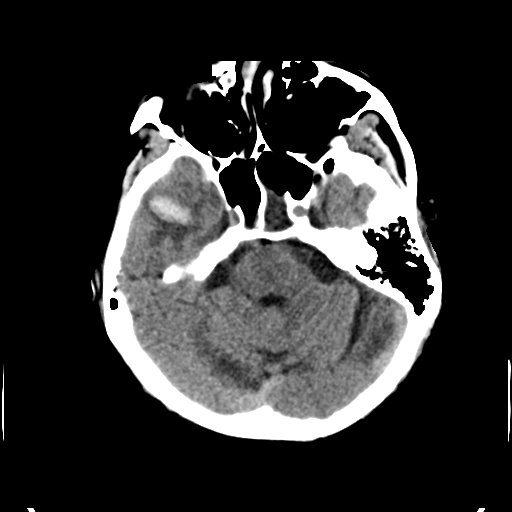
[im 15/34  brain]
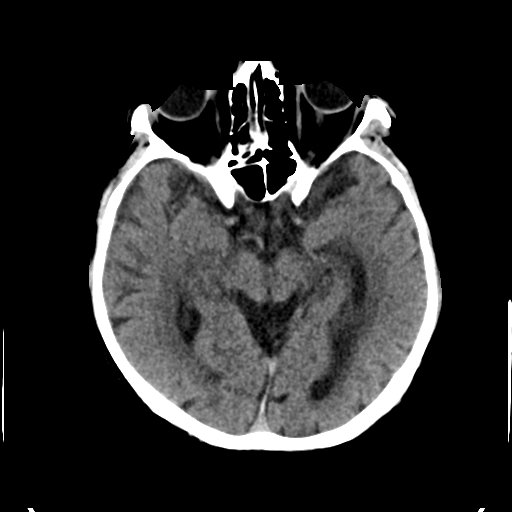
[im 19/34  brain]
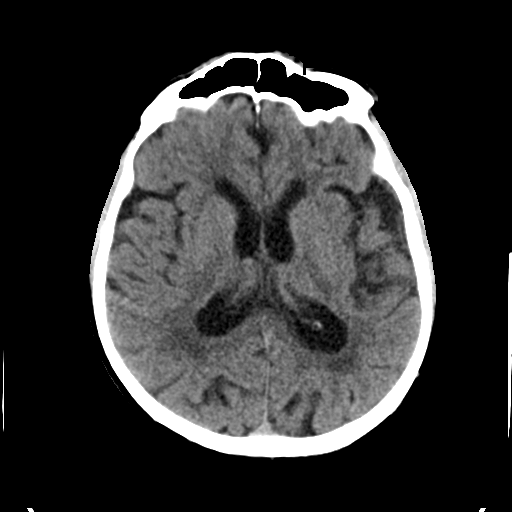
[im 19/34  bone]
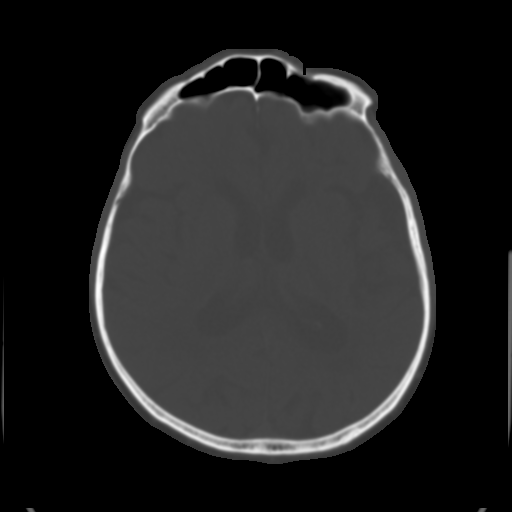
[im 23/34  brain]
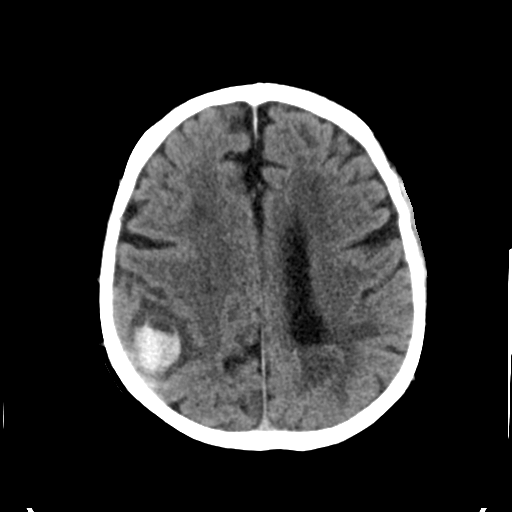
[im 27/34  brain]
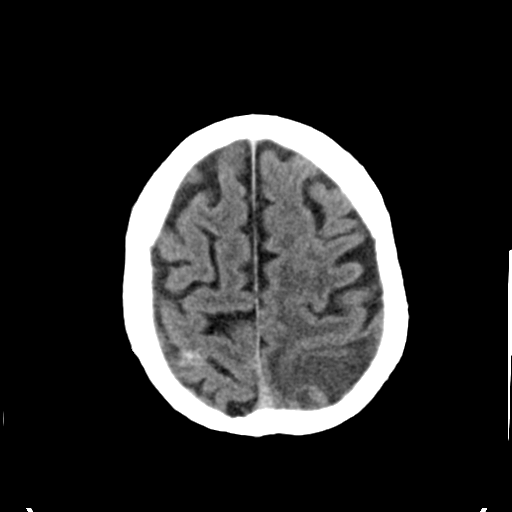
[im 31/34  brain]
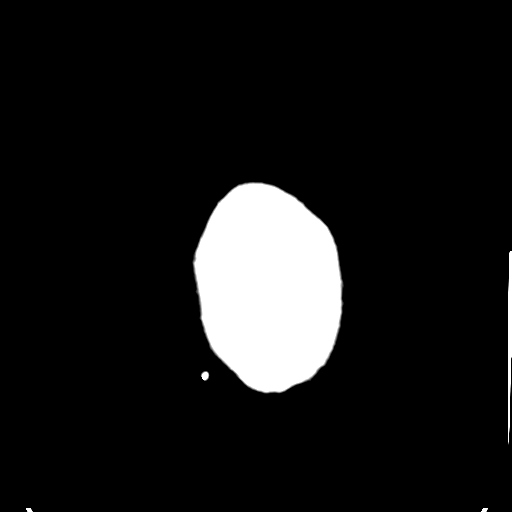

[Series 5: head 3.0 mpr cor · coronal · 0.39mm/px · 3 of 79 slices shown]
[im 27/79  brain]
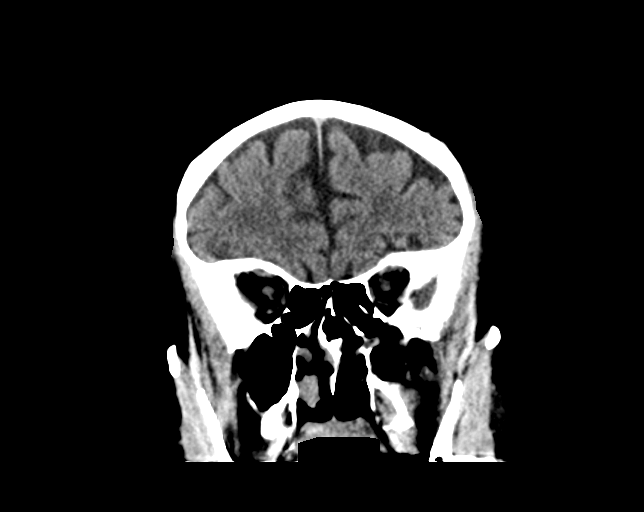
[im 35/79  brain]
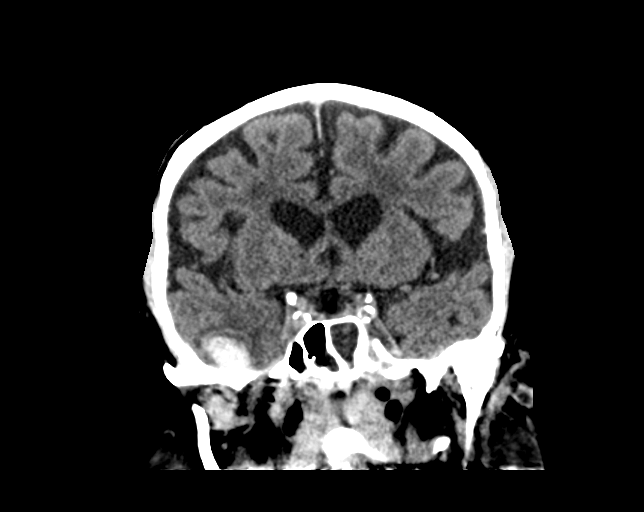
[im 44/79  brain]
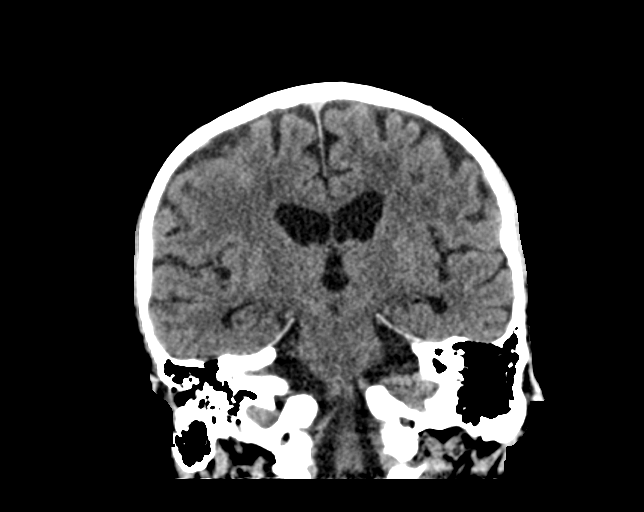

[Series 6: head 3.0 mpr sag · sagittal · 0.37mm/px · 3 of 67 slices shown]
[im 23/67  brain]
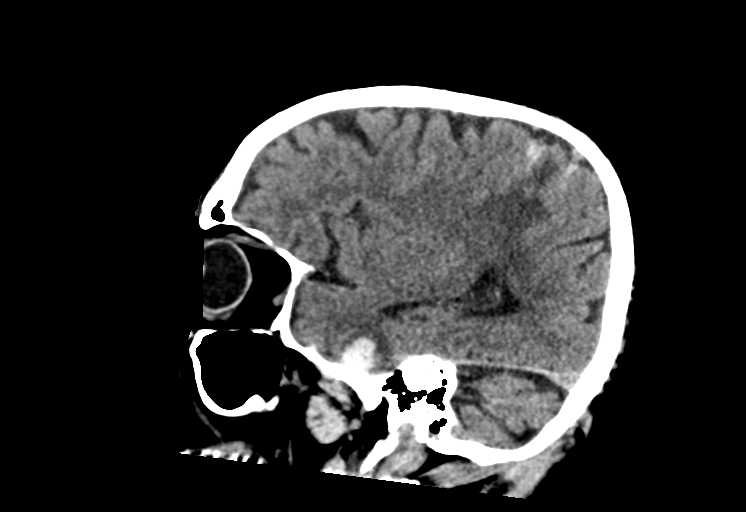
[im 34/67  brain]
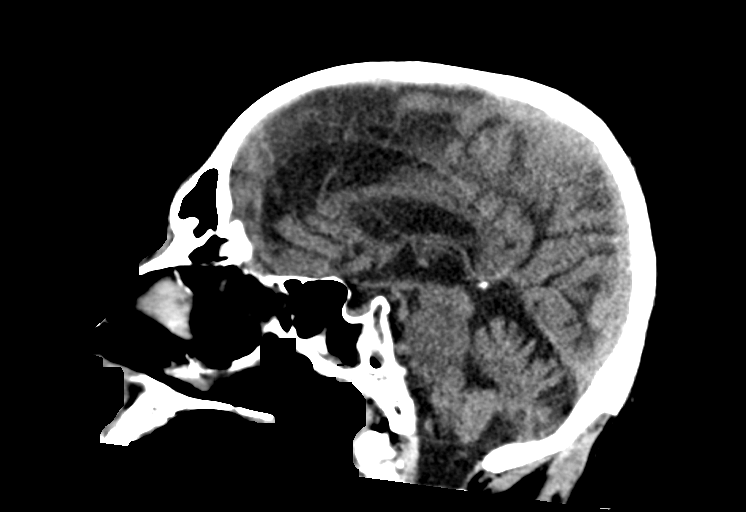
[im 45/67  brain]
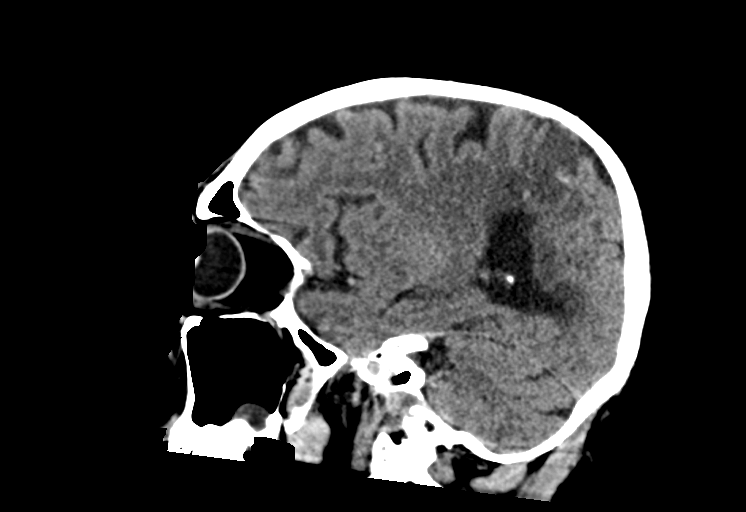

[14 of 47 positions shown; findings below may reference images not displayed]

FINDINGS: BRAIN: Evolving RIGHT temporal and RIGHT parietal intraparenchymal
hematomas with mild regional mass effect. Small volume residual
RIGHT parietal subarachnoid hemorrhage. LEFT subacute to old
hematoma and small old LEFT occipital lobe hematomas. No midline
shift. Moderate ventriculomegaly on the basis of global parenchymal
brain volume loss. Patchy to confluent supratentorial white matter
hypodensities compatible with chronic small vessel ischemic
disease.. No acute large vascular territory infarcts. Basal cisterns
are patent.

VASCULAR: Mild to moderate calcific atherosclerosis of the carotid
siphons.

SKULL: No skull fracture. No significant scalp soft tissue swelling.
RIGHT parietal sebaceous cyst.

SINUSES/ORBITS: Lobulated paranasal sinus mucosal thickening.
Approximate tooth 16 periapical abscess. Trace LEFT mastoid
effusion. Included ocular globes and orbital contents are
non-suspicious.

OTHER: None.
IMPRESSION: 1. Evolving acute RIGHT temporal and RIGHT parietal intraparenchymal
hematomas. Regional mass effect without midline shift.
2. Small volume residual RIGHT parietal subarachnoid hemorrhage.
3. Subacute to old LEFT parietal and small old LEFT occipital lobe
hematomas.
4. Constellation of findings most compatible with amyloid
angiopathy.

## 2018-08-29 IMAGING — DX DG SHOULDER 1V*L*
4 series · 4 of 4 positions shown · non-contrast
Comparison: None.

CLINICAL DATA: Left shoulder pain beginning last night. No known
injury.

EXAM:
LEFT SHOULDER - 1 VIEW

[shoulder axial]
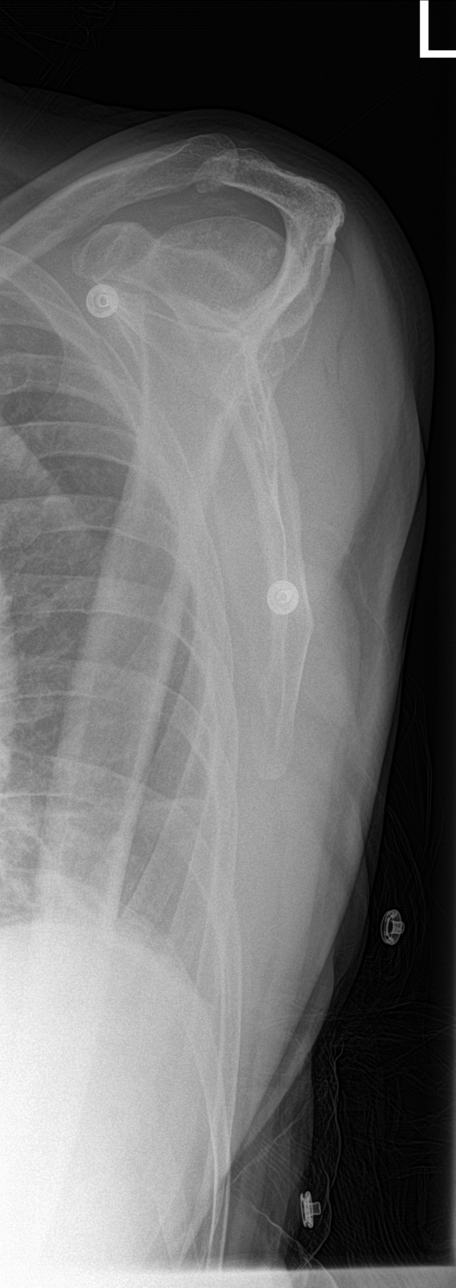

[shoulder swimmer]
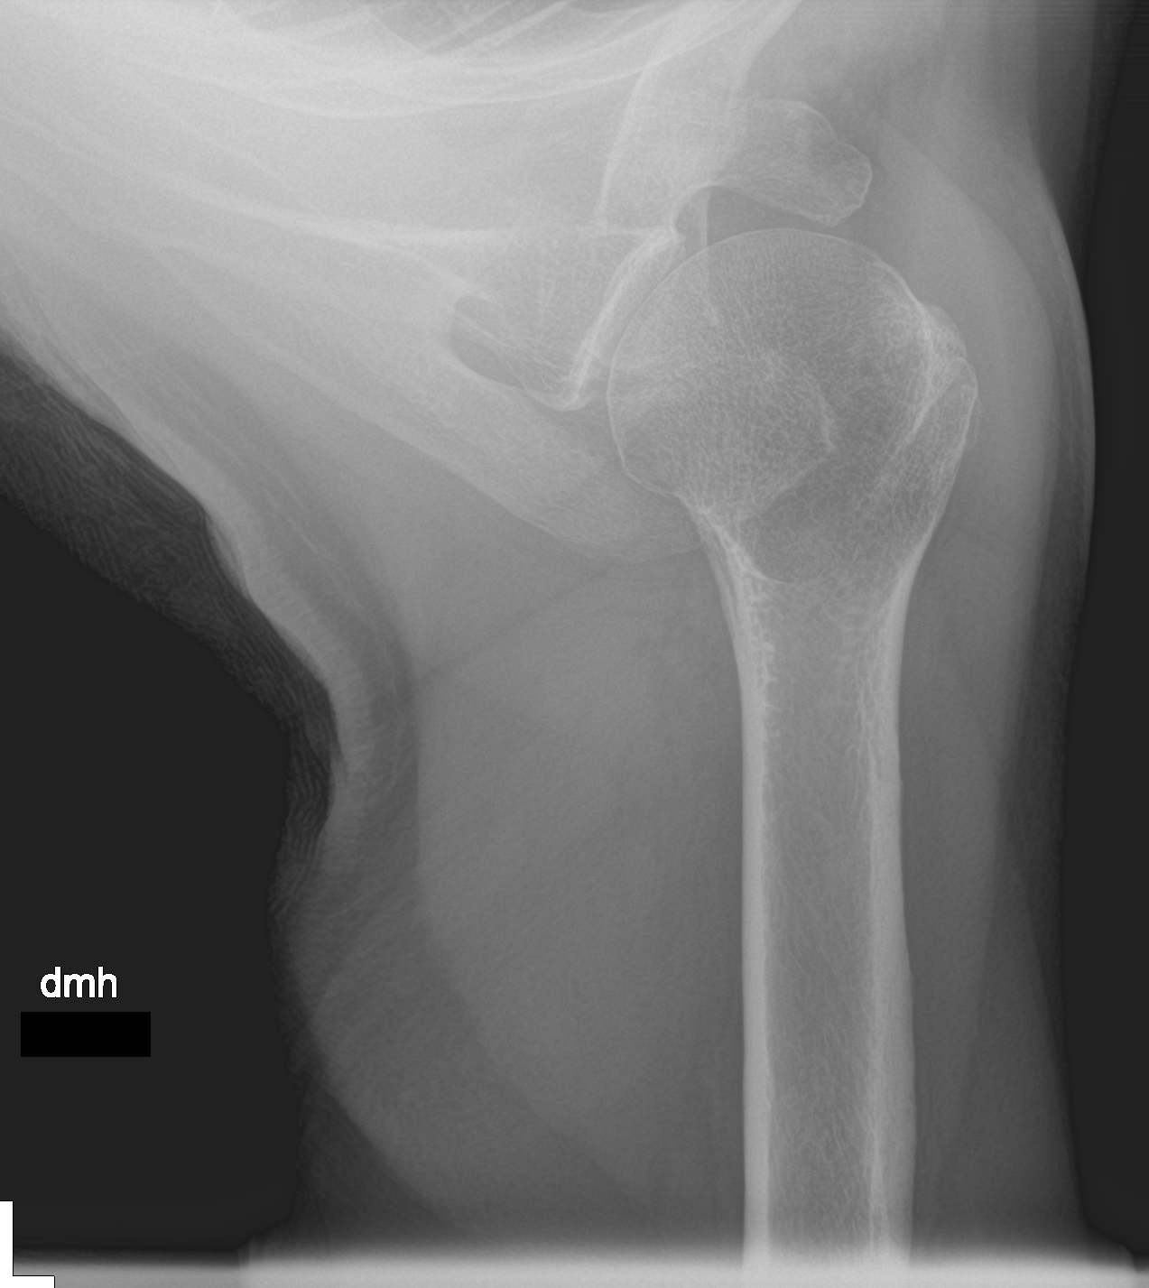

[shoulder ap (1 of 2)]
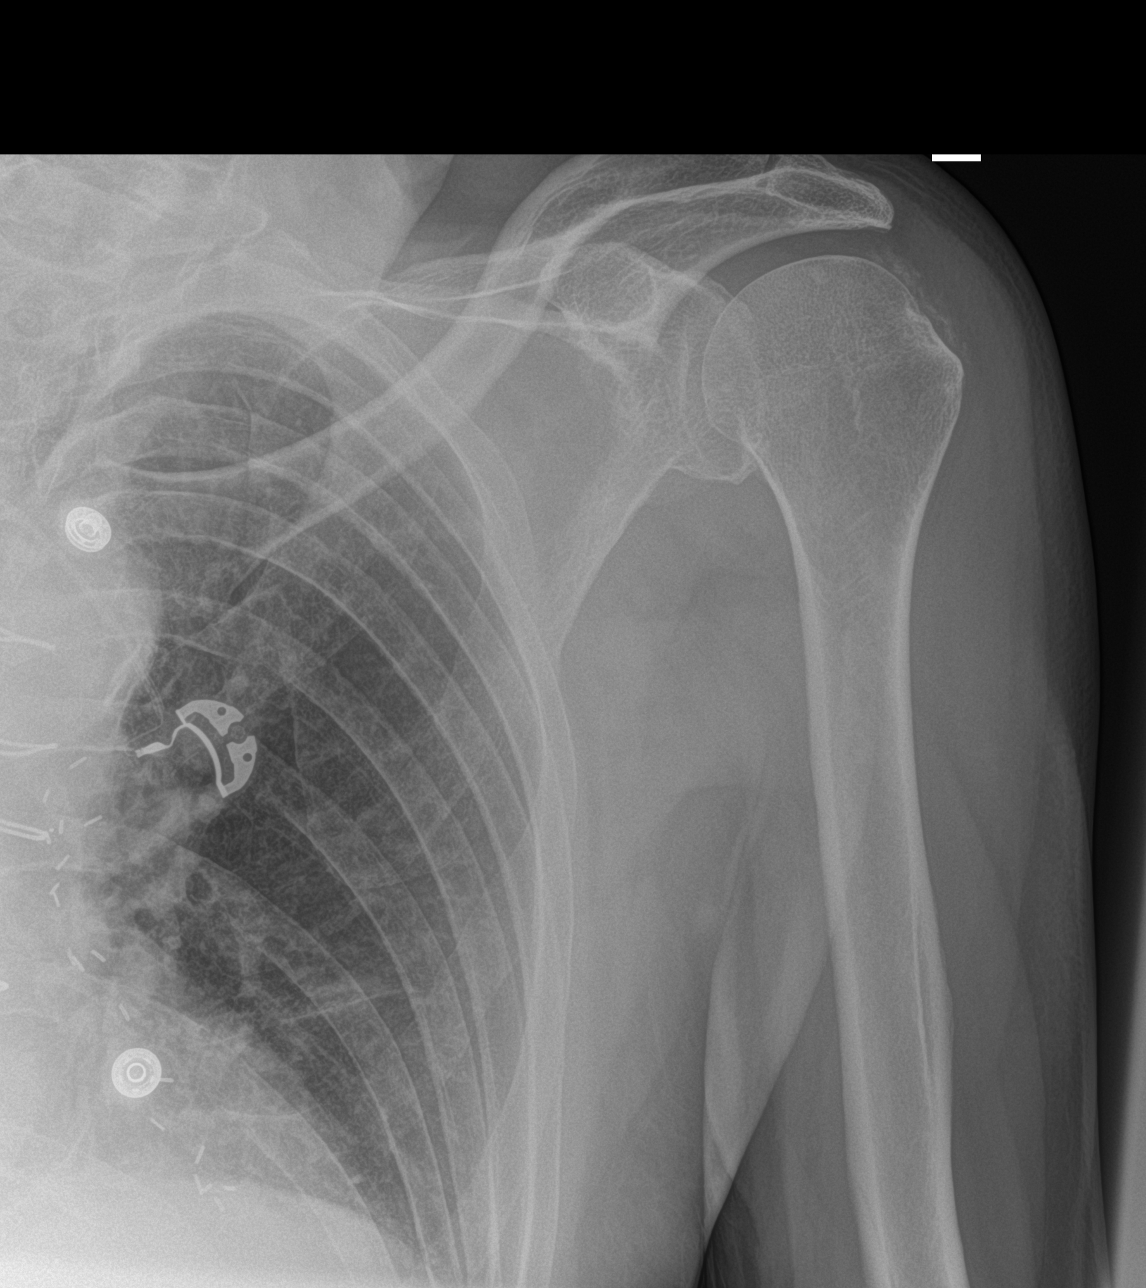

[shoulder ap (2 of 2)]
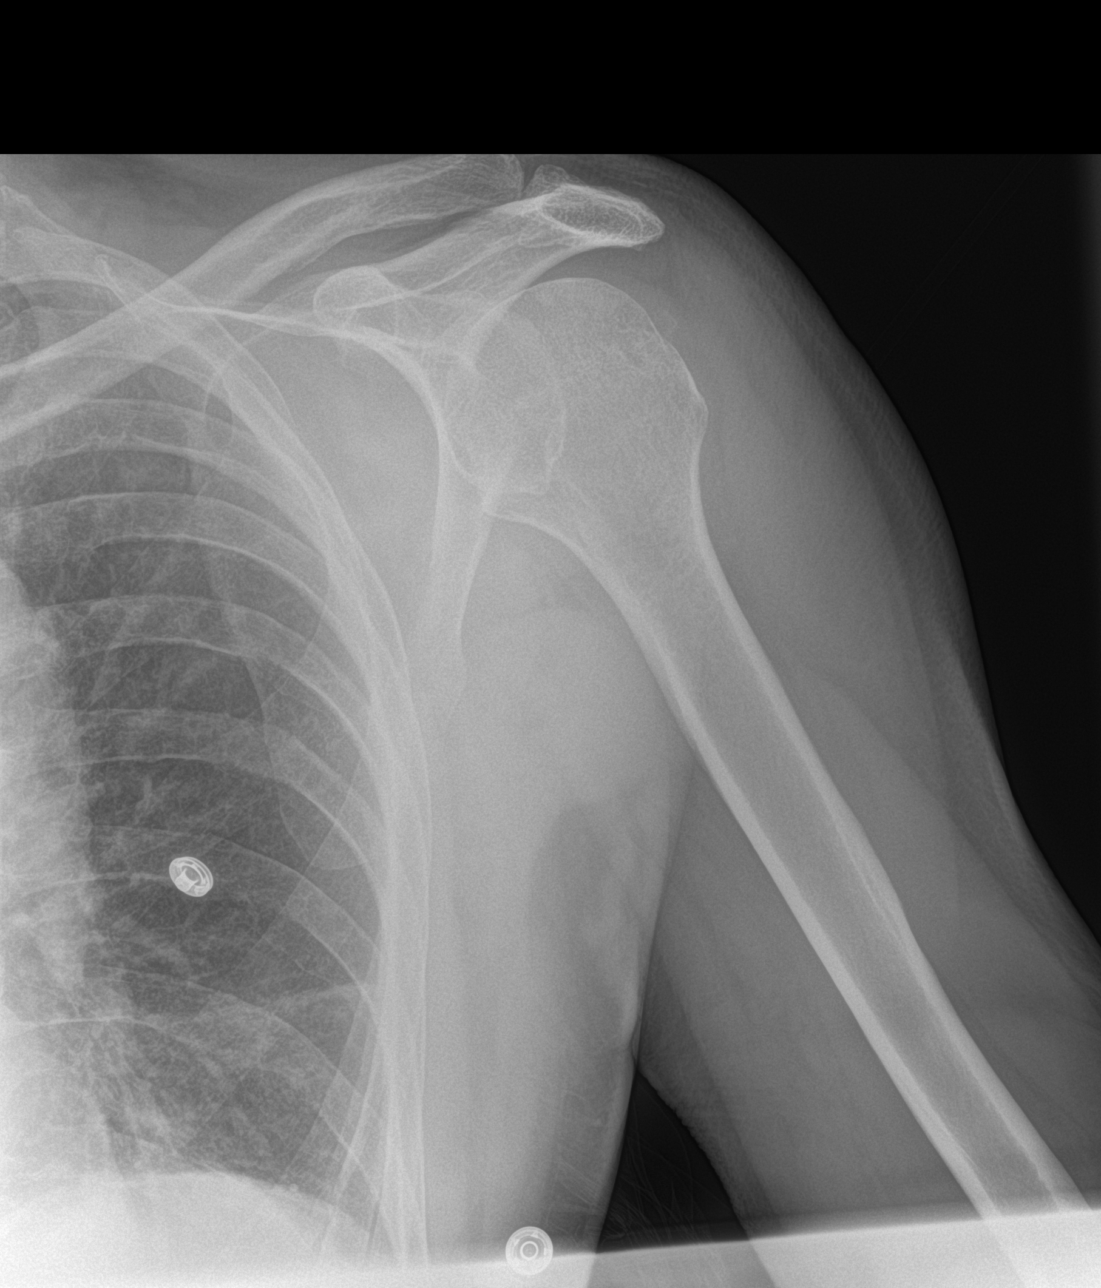

[4 of 4 positions shown; findings below may reference images not displayed]

FINDINGS: There is no acute bony or joint abnormality. The calcification over
the greater tuberosity is consistent with calcific rotator cuff
tendinopathy. Mild acromioclavicular degenerative change is seen.
The glenohumeral joint is unremarkable. Imaged left lung and ribs
appear normal. The patient is status post CABG. Aortic
atherosclerosis is noted.
IMPRESSION: No acute finding.

Calcific rotator cuff tendinopathy.

Mild acromioclavicular osteoarthritis.

Atherosclerosis.
